# Patient Record
Sex: Male | Born: 1975 | Race: Black or African American | Marital: Single | Smoking: Former smoker
Health system: Northeastern US, Academic
[De-identification: ages and names within clinical notes are randomized; demographics above are authoritative.]

## PROBLEM LIST (undated history)

## (undated) DIAGNOSIS — N179 Acute kidney failure, unspecified: Secondary | ICD-10-CM

## (undated) DIAGNOSIS — G4730 Sleep apnea, unspecified: Secondary | ICD-10-CM

## (undated) DIAGNOSIS — I209 Angina pectoris, unspecified: Secondary | ICD-10-CM

## (undated) DIAGNOSIS — I5030 Unspecified diastolic (congestive) heart failure: Secondary | ICD-10-CM

## (undated) DIAGNOSIS — I2510 Atherosclerotic heart disease of native coronary artery without angina pectoris: Secondary | ICD-10-CM

## (undated) DIAGNOSIS — G35 Multiple sclerosis: Secondary | ICD-10-CM

## (undated) DIAGNOSIS — J45909 Unspecified asthma, uncomplicated: Secondary | ICD-10-CM

## (undated) DIAGNOSIS — N189 Chronic kidney disease, unspecified: Secondary | ICD-10-CM

## (undated) DIAGNOSIS — N185 Chronic kidney disease, stage 5: Secondary | ICD-10-CM

## (undated) DIAGNOSIS — F419 Anxiety disorder, unspecified: Secondary | ICD-10-CM

## (undated) DIAGNOSIS — I219 Acute myocardial infarction, unspecified: Secondary | ICD-10-CM

## (undated) DIAGNOSIS — J449 Chronic obstructive pulmonary disease, unspecified: Secondary | ICD-10-CM

## (undated) DIAGNOSIS — I10 Essential (primary) hypertension: Secondary | ICD-10-CM

## (undated) DIAGNOSIS — G35D Multiple sclerosis, unspecified: Secondary | ICD-10-CM

## (undated) HISTORY — DX: Chronic kidney disease, unspecified: N17.9

## (undated) HISTORY — DX: Chronic kidney disease, unspecified: N18.9

## (undated) HISTORY — DX: Chronic obstructive pulmonary disease, unspecified: J44.9

## (undated) HISTORY — DX: Sleep apnea, unspecified: G47.30

## (undated) HISTORY — DX: Multiple sclerosis: G35

## (undated) HISTORY — DX: Essential (primary) hypertension: I10

## (undated) HISTORY — DX: Acute myocardial infarction, unspecified: I21.9

## (undated) HISTORY — DX: Multiple sclerosis, unspecified: G35.D

---
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
## 1996-08-20 DIAGNOSIS — D649 Anemia, unspecified: Secondary | ICD-10-CM

## 1996-08-20 HISTORY — DX: Anemia, unspecified: D64.9

## 2008-08-20 DIAGNOSIS — I639 Cerebral infarction, unspecified: Secondary | ICD-10-CM

## 2008-08-20 DIAGNOSIS — I219 Acute myocardial infarction, unspecified: Secondary | ICD-10-CM

## 2008-08-20 HISTORY — DX: Cerebral infarction, unspecified: I63.9

## 2008-08-20 HISTORY — PX: CARDIAC CATHETERIZATION: SHX172

## 2008-08-20 HISTORY — DX: Acute myocardial infarction, unspecified: I21.9

## 2017-08-20 DIAGNOSIS — I10 Essential (primary) hypertension: Secondary | ICD-10-CM

## 2017-08-20 HISTORY — DX: Essential (primary) hypertension: I10

## 2020-01-05 ENCOUNTER — Ambulatory Visit: Payer: Self-pay | Attending: Internal Medicine | Admitting: Internal Medicine

## 2020-01-05 ENCOUNTER — Other Ambulatory Visit: Payer: Self-pay

## 2020-01-05 ENCOUNTER — Encounter: Payer: Self-pay | Admitting: Internal Medicine

## 2020-01-05 VITALS — BP 180/120 | HR 76 | Temp 97.2°F | Resp 16 | Ht 69.0 in | Wt 219.2 lb

## 2020-01-05 DIAGNOSIS — E66811 Obesity, class 1: Secondary | ICD-10-CM | POA: Insufficient documentation

## 2020-01-05 DIAGNOSIS — E6609 Other obesity due to excess calories: Secondary | ICD-10-CM

## 2020-01-05 DIAGNOSIS — E1159 Type 2 diabetes mellitus with other circulatory complications: Secondary | ICD-10-CM

## 2020-01-05 DIAGNOSIS — Z7689 Persons encountering health services in other specified circumstances: Secondary | ICD-10-CM

## 2020-01-05 DIAGNOSIS — I10 Essential (primary) hypertension: Secondary | ICD-10-CM

## 2020-01-05 DIAGNOSIS — I2510 Atherosclerotic heart disease of native coronary artery without angina pectoris: Secondary | ICD-10-CM

## 2020-01-05 DIAGNOSIS — Z6832 Body mass index (BMI) 32.0-32.9, adult: Secondary | ICD-10-CM

## 2020-01-05 DIAGNOSIS — J4540 Moderate persistent asthma, uncomplicated: Secondary | ICD-10-CM

## 2020-01-05 DIAGNOSIS — F17200 Nicotine dependence, unspecified, uncomplicated: Secondary | ICD-10-CM

## 2020-01-05 DIAGNOSIS — I6930 Unspecified sequelae of cerebral infarction: Secondary | ICD-10-CM

## 2020-01-05 DIAGNOSIS — E119 Type 2 diabetes mellitus without complications: Secondary | ICD-10-CM | POA: Insufficient documentation

## 2020-01-05 MED ORDER — AMLODIPINE BESYLATE 5 MG PO TABS: 5.0000 mg | ORAL_TABLET | Freq: Every day | ORAL | 3 refills | Status: DC

## 2020-01-05 MED ORDER — FLUTICASONE-SALMETEROL 250-50 MCG/DOSE IN AEPB: 1.0000 | INHALATION_SPRAY | Freq: Two times a day (BID) | RESPIRATORY_TRACT | 3 refills | Status: DC

## 2020-01-05 MED ORDER — MELOXICAM 15 MG PO TABS: 15.0000 mg | ORAL_TABLET | Freq: Every day | ORAL | 6 refills | Status: DC

## 2020-01-05 MED ORDER — LISINOPRIL 10 MG PO TABS: 5.0000 mg | ORAL_TABLET | Freq: Every day | ORAL | 6 refills | Status: DC

## 2020-01-05 MED ORDER — ASPIRIN EC 81 MG PO TBEC: 81.0000 mg | DELAYED_RELEASE_TABLET | Freq: Every day | ORAL | 3 refills | Status: DC

## 2020-01-05 MED ORDER — SIMVASTATIN 40 MG PO TABS: 40.0000 mg | ORAL_TABLET | Freq: Every day | ORAL | 6 refills | Status: DC

## 2020-01-05 MED ORDER — ALBUTEROL SULFATE HFA 108 (90 BASE) MCG/ACT IN AERS: 2.0000 | INHALATION_SPRAY | Freq: Four times a day (QID) | RESPIRATORY_TRACT | 6 refills | Status: DC | PRN

## 2020-01-05 MED ORDER — LISINOPRIL 10 MG PO TABS: 10.0000 mg | ORAL_TABLET | Freq: Every day | ORAL | 6 refills | Status: DC

## 2020-01-05 MED ORDER — METFORMIN HCL 500 MG PO TABS: 500.0000 mg | ORAL_TABLET | Freq: Every day | ORAL | 6 refills | Status: DC

## 2020-01-05 MED ORDER — METOPROLOL TARTRATE 25 MG PO TABS: 25.0000 mg | ORAL_TABLET | Freq: Two times a day (BID) | ORAL | 6 refills | Status: DC

## 2020-01-05 MED FILL — !ADVAIR 250/50 DISKUS: 250-50 | 30 days supply | Qty: 60 | Fill #0

## 2020-01-05 MED FILL — METOPROLOL TARTRATE 25 MG T: 25 | 30 days supply | Qty: 60 | Fill #0

## 2020-01-05 MED FILL — AMLODIPINE BESYLATE 5 MG TA: 5 | 30 days supply | Qty: 30 | Fill #0

## 2020-01-05 MED FILL — LISINOPRIL 10 MG TABS: 10 | 30 days supply | Qty: 15 | Fill #0

## 2020-01-05 MED FILL — VENTOLIN HFA 90 MCG INHALER: 108 (90 BAS | 25 days supply | Qty: 18 | Fill #0

## 2020-01-05 MED FILL — MELOXICAM 15 MG TABLET: 15 | 30 days supply | Qty: 30 | Fill #0

## 2020-01-05 MED FILL — METFORMIN HCL 500 MG TABS: 500 | 30 days supply | Qty: 30 | Fill #0

## 2020-01-05 NOTE — Progress Notes (Signed)
[id number]

## 2020-01-05 NOTE — Progress Notes (Signed)
Patient ID: George Day, male    DOB: 07-28-1976  MRN: [id number]  CC: New Patient (Initial Visit)   Subjective: George Day is a 44 y.o. male who presents for new pt visit His concerns today include:   Previous PCP was in [place], [place]. Last seen 1 mth ago.  Relocated to [place] to be with his fiance.   Pt with hx of HTN, HL, DM type 2, asthma, CAD (MI in 2010, no stent placement), CVA 2010 without residual deficit  HYPERTENSION/CAD Currently taking: see medication list.  He is on enalapril, Zocor, metoprolol, and sublingual nitro Med Adherence: [x]  Yes    []  No Medication side effects: []  Yes    [x]  No Adherence with salt restriction: [x]  Yes    []  No Home Monitoring?: [x]  Yes    []  No Monitoring Frequency: daily Home BP results range: 120/97.   SOB? []  Yes    [x]  No -all the time Chest Pain?: []  Yes    [x]  No. Last used SL Nitro 2019 Leg swelling?: [x]  Yes - RT leg    []  No Headaches?: []  Yes    [x]  No Dizziness? []  Yes    [x]  No Comments:  Suppose to be on ASA but currently does not have any.   DIABETES TYPE 2 Last A1C:   BS 97/A1C 5.6 Med Adherence:  [x]  Yes on Metformin   []  No Medication side effects:  []  Yes    [x]  No Home Monitoring?  [x]  Yes   Home glucose results range:before BF 90-100, bedtime 120-130 Diet Adherence: [x]  Yes - not eating as much as before     Exercise: [x]  Yes - always busy with his 3 little girls and walks some    Hypoglycemic episodes?: []  Yes    []  No Numbness of the feet? []  Yes    [x]  No -sharp pain RT foot at times Retinopathy hx? []  Yes    [x]  No Last eye exam: 10/2019.  New rxn lens Comments:  Asthma: on Advair inhaler 3 times a day. Takes 3 times/day because "I get so out of breath" especially when climbing steps.  -smokes 2 cigars a day.  Smoke since age 66.  Quit in 2010 for 2 yrs then restarted due to stress. Wants to quit.  Feels he can quit cold [place].   Set quit day for next Monday.    Was being eval by neurologist for  weakness RT leg. Reports having had multiple tests including scans of the head and whole body scans.  Scan of head revealed old stroke. RT leg feels heavy.  Has difficulty walking, often trips.  Problems x 3 yrs.  He states that the neurologist that he was seeing had referred him to see another neurologist to be evaluated to see if he has MS.  However he relocated prior to having that appointment.  Past medical, social, family history reviewed and updated. Current Outpatient Medications on File Prior to Visit  Medication Sig Dispense Refill  . nitroGLYCERIN (NITROSTAT) 0.6 MG SL tablet Place 0.6 mg under the tongue every 5 (five) minutes as needed for chest pain.     No current facility-administered medications on file prior to visit.    Allergies  Allergen Reactions  . Penicillins Anaphylaxis    ALL    Social History   Socioeconomic History  . Marital status: Single    Spouse name: Not on file  . Number of children: 3  . Years of education: Not on  file  . Highest education level: Not on file  Occupational History  . Not on file  Tobacco Use  . Smoking status: Current Every Day Smoker    Types: Cigars  . Smokeless tobacco: Never Used  Substance and Sexual Activity  . Alcohol use: Yes    Comment: occasioanlly   . Drug use: Never  . Sexual activity: Not on file  Other Topics Concern  . Not on file  Social History Narrative  . Not on file   Social Determinants of Health   Financial Resource Strain:   . Difficulty of Paying Living Expenses:   Food Insecurity:   . Worried About Charity fundraiser in the Last Year:   . Arboriculturist in the Last Year:   Transportation Needs:   . Film/video editor (Medical):   Marland Kitchen Lack of Transportation (Non-Medical):   Physical Activity:   . Days of Exercise per Week:   . Minutes of Exercise per Session:   Stress:   . Feeling of Stress :   Social Connections:   . Frequency of Communication with Friends and Family:   .  Frequency of Social Gatherings with Friends and Family:   . Attends Religious Services:   . Active Member of Clubs or Organizations:   . Attends Archivist Meetings:   Marland Kitchen Marital Status:   Intimate Partner Violence:   . Fear of Current or Ex-Partner:   . Emotionally Abused:   Marland Kitchen Physically Abused:   . Sexually Abused:     Family History  Problem Relation Age of Onset  . Diabetes Mother   . Hyperlipidemia Mother   . Diabetes Sister   . Hyperlipidemia Sister   . Hearing loss Sister   . Diabetes Brother   . Hyperlipidemia Brother     ROS: Review of Systems Negative except as stated above  PHYSICAL EXAM: BP (!) 180/120   Pulse 76   Temp (!) 97.2 F (36.2 C)   Resp 16   Ht 5\' 9"  (1.753 m)   Wt 219 lb 3.2 oz (99.4 kg)   SpO2 100%   BMI 32.37 kg/m   Physical Exam  General appearance - alert, well appearing, middle-age overweight African-American male and in no distress Mental status - normal mood, behavior, speech, dress, motor activity, and thought processes Eyes - pupils equal and reactive, extraocular eye movements intact Ears - bilateral TM's and external ear canals normal Nose - normal and patent, no erythema, discharge or polyps Mouth - mucous membranes moist, pharynx normal without lesions Neck - supple, no significant adenopathy Chest - clear to auscultation, no wheezes, rales or rhonchi, symmetric air entry Heart - normal rate, regular rhythm, normal S1, S2, no murmurs, rubs, clicks or gallops Neurological -cranial nerves grossly intact.  Power in the upper extremities including grip 5/5 bilaterally.  Power in the lower extremities 4+/5 on the right, 5/5 on the left Extremities -no lower extremity edema Diabetic Foot Exam - Simple   Simple Foot Form Visual Inspection See comments: Yes Sensation Testing See comments: Yes Pulse Check Posterior Tibialis and Dorsalis pulse intact bilaterally: Yes Comments Toenails are thick and overgrown.  Toenail on  the right big toe is the worst.  Decreased sensation on leap exam on the plantar surface of both feet.     ASSESSMENT AND PLAN: 1. Encounter to establish care   2. Type 2 diabetes mellitus with other circulatory complication, without long-term current use of insulin (HCC) Discussed the importance  of healthy eating habits, regular aerobic exercise (at least 150 minutes a week as tolerated) and medication compliance to achieve or maintain control of diabetes. -A1c is at goal.  Continue current dose of Metformin. - POCT glucose (manual entry) - POCT glycosylated hemoglobin (Hb A1C) - metFORMIN (GLUCOPHAGE) 500 MG tablet; Take 1 tablet (500 mg total) by mouth daily with breakfast.  Dispense: 30 tablet; Refill: 6 - CBC - Comprehensive metabolic panel - Lipid panel - Microalbumin / creatinine urine ratio  3. Essential hypertension Not at goal.  Continue metoprolol.  Increase lisinopril to 10 mg daily.  Add amlodipine - metoprolol tartrate (LOPRESSOR) 25 MG tablet; Take 1 tablet (25 mg total) by mouth 2 (two) times daily.  Dispense: 60 tablet; Refill: 6 - lisinopril (ZESTRIL) 10 MG tablet;  by mouth daily.  Dispense: 30 tablet; Refill: 6 - amLODipine (NORVASC) 5 MG tablet; Take 1 tablet (5 mg total) by mouth daily. For blood pressure  Dispense: 30 tablet; Refill: 3  4. Tobacco dependence Patient advised to quit smoking. Discussed health risks associated with smoking including lung and other types of cancers, chronic lung diseases and CV risks.. Pt ready to give trail of quitting.  Discussed methods to help quit including quitting cold [place], use of NRT, Chantix and Bupropion.  Patient states he will quit cold [place] next Monday.  If he finds that his cravings are too great, I told him to give me a call so that we can prescribe medication to decrease the cravings.  Less than 5 minutes spent on counseling   5. Coronary artery disease involving native coronary artery of native heart without  angina pectoris Stable.  Continue statin therapy and metoprolol.  Added low-dose aspirin - simvastatin (ZOCOR) 40 MG tablet; Take 1 tablet (40 mg total) by mouth daily.  Dispense: 30 tablet; Refill: 6 - metoprolol tartrate (LOPRESSOR) 25 MG tablet; Take 1 tablet (25 mg total) by mouth 2 (two) times daily.  Dispense: 60 tablet; Refill: 6 - aspirin EC 81 MG tablet; Take 1 tablet (81 mg total) by mouth daily.  Dispense: 100 tablet; Refill: 3  6. History of CVA with residual deficit Stressed importance of secondary prevention including good blood pressure control, tobacco cessation and diabetes control  7. Moderate persistent asthma without complication Advised that Advair should be used just twice a day not 3 times a day.  Went over the difference between maintenance and rescue inhaler. - Fluticasone-Salmeterol (ADVAIR DISKUS) 250-50 MCG/DOSE AEPB; Inhale 1 puff into the lungs 2 (two) times daily.  Dispense: 1 each; Refill: 3 - albuterol (VENTOLIN HFA) 108 (90 Base) MCG/ACT inhaler; Inhale 2 puffs into the lungs every 6 (six) hours as needed for wheezing or shortness of breath.  Dispense: 6.7 g; Refill: 6  8. Class 1 obesity due to excess calories with serious comorbidity and body mass index (BMI) of 32.0 to 32.9 in adult See #1 above   Patient was given the opportunity to ask questions.  Patient verbalized understanding of the plan and was able to repeat key elements of the plan.   Orders Placed This Encounter  Procedures  . CBC  . Comprehensive metabolic panel  . Lipid panel  . Microalbumin / creatinine urine ratio  . POCT glucose (manual entry)  . POCT glycosylated hemoglobin (Hb A1C)     Requested Prescriptions   Signed Prescriptions Disp Refills  . simvastatin (ZOCOR) 40 MG tablet 30 tablet 6    Sig: Take 1 tablet (40 mg total) by mouth daily.  Marland Kitchen  metFORMIN (GLUCOPHAGE) 500 MG tablet 30 tablet 6    Sig: Take 1 tablet (500 mg total) by mouth daily with breakfast.  . metoprolol  tartrate (LOPRESSOR) 25 MG tablet 60 tablet 6    Sig: Take 1 tablet (25 mg total) by mouth 2 (two) times daily.  . meloxicam (MOBIC) 15 MG tablet 30 tablet 6    Sig: Take 1 tablet (15 mg total) by mouth daily.  Marland Kitchen aspirin EC 81 MG tablet 100 tablet 3    Sig: Take 1 tablet (81 mg total) by mouth daily.  . Fluticasone-Salmeterol (ADVAIR DISKUS) 250-50 MCG/DOSE AEPB 1 each 3    Sig: Inhale 1 puff into the lungs 2 (two) times daily.  Marland Kitchen albuterol (VENTOLIN HFA) 108 (90 Base) MCG/ACT inhaler 6.7 g 6    Sig: Inhale 2 puffs into the lungs every 6 (six) hours as needed for wheezing or shortness of breath.  Marland Kitchen amLODipine (NORVASC) 5 MG tablet 30 tablet 3    Sig: Take 1 tablet (5 mg total) by mouth daily. For blood pressure  . lisinopril (ZESTRIL) 10 MG tablet 30 tablet 6    Sig: Take 1 tablet (10 mg total) by mouth daily.    Return in about 7 weeks (around 02/23/2020).  Karle Plumber, MD, FACP

## 2020-01-05 NOTE — Patient Instructions (Signed)
Please have patient sign a release for [place] to get his medical records from his previous PCP in [place].  Please give patient an appointment with the clinical pharmacist in 1 week for repeat blood pressure check.  Please give patient forms for the orange card/cone discount.

## 2020-01-06 ENCOUNTER — Telehealth: Payer: Self-pay | Admitting: Internal Medicine

## 2020-01-06 LAB — CBC
Hematocrit: 41.7 % (ref 37.5–51.0)
Hemoglobin: 12.6 g/dL — ABNORMAL LOW (ref 13.0–17.7)
MCH: 23.5 pg — ABNORMAL LOW (ref 26.6–33.0)
MCHC: 30.2 g/dL — ABNORMAL LOW (ref 31.5–35.7)
MCV: 78 fL — ABNORMAL LOW (ref 79–97)
Platelets: 190 10*3/uL (ref 150–450)
RBC: 5.37 x10E6/uL (ref 4.14–5.80)
RDW: 16.6 % — ABNORMAL HIGH (ref 11.6–15.4)
WBC: 6.4 10*3/uL (ref 3.4–10.8)

## 2020-01-06 LAB — COMPREHENSIVE METABOLIC PANEL
ALT: 24 IU/L (ref 0–44)
AST: 28 IU/L (ref 0–40)
Albumin/Globulin Ratio: 1.4 (ref 1.2–2.2)
Albumin: 4.5 g/dL (ref 4.0–5.0)
Alkaline Phosphatase: 108 IU/L (ref 48–121)
BUN/Creatinine Ratio: 16 (ref 9–20)
BUN: 29 mg/dL — ABNORMAL HIGH (ref 6–24)
Bilirubin Total: 0.5 mg/dL (ref 0.0–1.2)
CO2: 23 mmol/L (ref 20–29)
Calcium: 9.7 mg/dL (ref 8.7–10.2)
Chloride: 105 mmol/L (ref 96–106)
Creatinine, Ser: 1.83 mg/dL — ABNORMAL HIGH (ref 0.76–1.27)
GFR calc Af Amer: 51 mL/min/{1.73_m2} — ABNORMAL LOW (ref >59)
GFR calc non Af Amer: 44 mL/min/{1.73_m2} — ABNORMAL LOW (ref >59)
Globulin, Total: 3.3 g/dL (ref 1.5–4.5)
Glucose: 83 mg/dL (ref 65–99)
Potassium: 4.4 mmol/L (ref 3.5–5.2)
Sodium: 142 mmol/L (ref 134–144)
Total Protein: 7.8 g/dL (ref 6.0–8.5)

## 2020-01-06 LAB — LIPID PANEL
Chol/HDL Ratio: 4.4 ratio (ref 0.0–5.0)
Cholesterol, Total: 181 mg/dL (ref 100–199)
HDL: 41 mg/dL (ref >39)
LDL Chol Calc (NIH): 111 mg/dL — ABNORMAL HIGH (ref 0–99)
Triglycerides: 167 mg/dL — ABNORMAL HIGH (ref 0–149)
VLDL Cholesterol Cal: 29 mg/dL (ref 5–40)

## 2020-01-06 LAB — GLUCOSE, POCT (MANUAL RESULT ENTRY): POC Glucose: 97 mg/dl (ref 70–99)

## 2020-01-06 LAB — POCT GLYCOSYLATED HEMOGLOBIN (HGB A1C): HbA1c POC (<> result, manual entry): 5.6 % (ref 4.0–5.6)

## 2020-01-06 MED ORDER — ATORVASTATIN CALCIUM 40 MG PO TABS
40.0000 mg | ORAL_TABLET | Freq: Every day | ORAL | 6 refills | Status: DC
Start: 2020-01-06 — End: 2020-08-16

## 2020-01-06 MED FILL — ATORVASTATIN CALCIUM 40 MG: 40 | 30 days supply | Qty: 30 | Fill #0

## 2020-01-06 NOTE — Telephone Encounter (Signed)
Phone call placed to patient today to go over lab results from yesterday.  Patient advised that his LDL cholesterol was 111 with goal being less than 70 in patients with diabetes.  He reports compliance with simvastatin.  I have recommended that we discontinue simvastatin and change it to Lipitor instead which I feel will work better for him.  2.  Kidney function is not 100%.  His GFR is 51 and creatinine is 1.8.  Patient denies any previous diagnosis of impaired kidney function.  Denies any use of over-the-counter NSAIDs.   Advised to discontinue meloxicam.  Use over-the-counter Tylenol if needed for pain Advised to hold Metformin.  His A1C yesterday was 5.6. We had changed enalapril to lisinopril and had increase the dose to 10 mg.  Await results of his urine microalbumin.  Patient tells me he was unable to give a urine specimen yesterday but was given the container and he plans to bring in a urine sample this week.  He did sign a release for [place] to get his medical records from his previous PCP. I would like for him to see the clinical pharmacist in 2 weeks for repeat blood pressure check.  On that visit he should stop at the lab to have a BMP done.  Patient expressed understanding of the plan and was able to repeat back instructions given.  Results for orders placed or performed in visit on 01/05/20  CBC  Result Value Ref Range   WBC 6.4 3.4 - 10.8 x10E3/uL   RBC 5.37 4.14 - 5.80 x10E6/uL   Hemoglobin 12.6 (L) 13.0 - 17.7 g/dL   Hematocrit 41.7 37.5 - 51.0 %   MCV 78 (L) 79 - 97 fL   MCH 23.5 (L) 26.6 - 33.0 pg   MCHC 30.2 (L) 31.5 - 35.7 g/dL   RDW 16.6 (H) 11.6 - 15.4 %   Platelets 190 150 - 450 x10E3/uL  Comprehensive metabolic panel  Result Value Ref Range   Glucose 83 65 - 99 mg/dL   BUN 29 (H) 6 - 24 mg/dL   Creatinine, Ser 1.83 (H) 0.76 - 1.27 mg/dL   GFR calc non Af Amer 44 (L) >59 mL/min/1.73   GFR calc Af Amer 51 (L) >59 mL/min/1.73   BUN/Creatinine Ratio 16 9 - 20   Sodium  142 134 - 144 mmol/L   Potassium 4.4 3.5 - 5.2 mmol/L   Chloride 105 96 - 106 mmol/L   CO2 23 20 - 29 mmol/L   Calcium 9.7 8.7 - 10.2 mg/dL   Total Protein 7.8 6.0 - 8.5 g/dL   Albumin 4.5 4.0 - 5.0 g/dL   Globulin, Total 3.3 1.5 - 4.5 g/dL   Albumin/Globulin Ratio 1.4 1.2 - 2.2   Bilirubin Total 0.5 0.0 - 1.2 mg/dL   Alkaline Phosphatase 108 48 - 121 IU/L   AST 28 0 - 40 IU/L   ALT 24 0 - 44 IU/L  Lipid panel  Result Value Ref Range   Cholesterol, Total 181 100 - 199 mg/dL   Triglycerides 167 (H) 0 - 149 mg/dL   HDL 41 >39 mg/dL   VLDL Cholesterol Cal 29 5 - 40 mg/dL   LDL Chol Calc (NIH) 111 (H) 0 - 99 mg/dL   Chol/HDL Ratio 4.4 0.0 - 5.0 ratio  POCT glucose (manual entry)  Result Value Ref Range   POC Glucose 97 70 - 99 mg/dl  POCT glycosylated hemoglobin (Hb A1C)  Result Value Ref Range   Hemoglobin A1C  HbA1c POC (<> result, manual entry) 5.6 4.0 - 5.6 %   HbA1c, POC (prediabetic range)     HbA1c, POC (controlled diabetic range)

## 2020-01-07 NOTE — Telephone Encounter (Signed)
Looks like you schedule pt his 7 week f/u with pcp when he checked out but not his 2 week bp check with clinical pharmacist. Please contact pt and schedule 2 week bp check from the day he seen pcp

## 2020-01-19 ENCOUNTER — Other Ambulatory Visit: Payer: Self-pay | Admitting: Internal Medicine

## 2020-01-19 ENCOUNTER — Other Ambulatory Visit: Payer: Self-pay

## 2020-01-19 ENCOUNTER — Ambulatory Visit: Payer: Self-pay | Attending: Internal Medicine

## 2020-01-19 DIAGNOSIS — I10 Essential (primary) hypertension: Secondary | ICD-10-CM

## 2020-01-19 DIAGNOSIS — E1159 Type 2 diabetes mellitus with other circulatory complications: Secondary | ICD-10-CM

## 2020-01-19 MED ORDER — AMLODIPINE BESYLATE 10 MG PO TABS: 10.0000 mg | ORAL_TABLET | Freq: Every day | ORAL | 6 refills | Status: DC

## 2020-01-19 NOTE — Progress Notes (Signed)
44 yrs old male is here for BP check as instructed by PCP. Pt initially scheduled with [facility] clinical pharmacist. Pt asymptomatic with no signs of acute distress. Stated has been taking all his meds  since prescribed including  today.  BP readings today  Manual L arm 194/104 P 69 O2 99. T 98.0. Advised pt to stay and see one of the provided. Unable to stay , has to go and pick up his son according to patient.  Readings were reported to MD Karle Plumber, patient PCP.  As per PCP Instructions were given to the patient to increase Amlodipine from 5 mg daily to 10 mg daily. Made pt aware of the medication change and that the new dose prescription was sent to the pharmacy. Educated pt about strict adherence to medication regime and the need for  diet and exercises. Stress the importance of obtaining a BP machine and keeping a log and bring it to the appt when seeing the provider. Verbalized understanding.  Appt with Lurena Joiner for BP f /U was scheduled for 01/29/2020

## 2020-01-20 ENCOUNTER — Telehealth: Payer: Self-pay

## 2020-01-20 LAB — MICROALBUMIN / CREATININE URINE RATIO
Creatinine, Urine: 148.2 mg/dL
Microalb/Creat Ratio: 133 mg/g creat — ABNORMAL HIGH (ref 0–29)
Microalbumin, Urine: 196.6 ug/mL

## 2020-01-20 NOTE — Telephone Encounter (Signed)
Contacted pt to go over urine results pt is aware and doesn't have any questions or concerns

## 2020-01-20 NOTE — Progress Notes (Signed)
Let patient know that he has mild amount of protein in the urine which could be due to the effects of diabetes on the kidney.  The medication lisinopril will help to protect the kidneys.

## 2020-01-29 ENCOUNTER — Ambulatory Visit: Payer: Self-pay | Attending: Internal Medicine | Admitting: Pharmacist

## 2020-01-29 ENCOUNTER — Other Ambulatory Visit: Payer: Self-pay

## 2020-01-29 ENCOUNTER — Encounter: Payer: Self-pay | Admitting: Pharmacist

## 2020-01-29 VITALS — BP 186/112

## 2020-01-29 DIAGNOSIS — I10 Essential (primary) hypertension: Secondary | ICD-10-CM

## 2020-01-29 NOTE — Progress Notes (Signed)
   S:    PCP: Dr. Wynetta Emery   Patient arrives in good spirits. Presents to the clinic for hypertension evaluation, counseling, and management.  Patient was referred and last seen by Primary Care Provider on 01/05/2020. Amlodipine and lisinopril were started.    Medication adherence: denies. He brings his current medications that were filled at a [facility] in [place]. Pt does not have amlodipine with him. Nor does he have lisinopril. Upon review of his pharmacy records, he never picked these up from our pharmacy after being told to do so at previous PCP visit.    Current BP Medications include:  Amlodipine 10 mg daily (not taking), lisinopril 10 mg daily (not taking), metoprolol 25 mg BID  Dietary habits include: pt limits salt; denies drinking caffeine  Exercise habits include: limited d/t leg/knee pain Family / Social history:  - FHx: DM, HLD - Tobacco: current every day smoker  - Alcohol: reports occasional use    O:  There were no vitals filed for this visit.  Home BP readings: none   Last 3 Office BP readings: BP Readings from Last 3 Encounters:  01/05/20 (!) 180/120    BMET    Component Value Date/Time   NA 142 01/05/2020 1515   K 4.4 01/05/2020 1515   CL 105 01/05/2020 1515   CO2 23 01/05/2020 1515   GLUCOSE 83 01/05/2020 1515   BUN 29 (H) 01/05/2020 1515   CREATININE 1.83 (H) 01/05/2020 1515   CALCIUM 9.7 01/05/2020 1515   GFRNONAA 44 (L) 01/05/2020 1515   GFRAA 51 (L) 01/05/2020 1515    Renal function: CrCl cannot be calculated (Patient's most recent lab result is older than the maximum 21 days allowed.).  Clinical ASCVD: Yes   A/P: Hypertension longstanding currently above goal on current medications. BP Goal = < 130/80 mmHg. Medication adherence: denies. I have coordinated with our pharmacy to fill and dispense his anti-HTN medications today. He will pick these up before leaving our clinic.   -Continued current regimen. Refills given.   -Counseled on lifestyle  modifications for blood pressure control including reduced dietary sodium, increased exercise, adequate sleep.  Results reviewed and written information provided.   Total time in face-to-face counseling 15 minutes.   F/U Clinic Visit 02/12/2020.   Benard Halsted, PharmD, [place] [phone]

## 2020-02-09 MED FILL — AMLODIPINE BESYLATE 10 MG T: 10 | 30 days supply | Qty: 30 | Fill #0

## 2020-02-09 MED FILL — VENTOLIN HFA 90 MCG INHALER: 108 (90 BAS | 25 days supply | Qty: 18 | Fill #0

## 2020-02-12 ENCOUNTER — Encounter: Payer: Self-pay | Admitting: Pharmacist

## 2020-02-12 ENCOUNTER — Other Ambulatory Visit: Payer: Self-pay

## 2020-02-12 ENCOUNTER — Ambulatory Visit: Payer: Self-pay | Attending: Internal Medicine | Admitting: Pharmacist

## 2020-02-12 VITALS — BP 203/130 | HR 74

## 2020-02-12 DIAGNOSIS — I10 Essential (primary) hypertension: Secondary | ICD-10-CM

## 2020-02-12 MED ORDER — NICOTINE 21 MG/24HR TD PT24: 21.0000 mg | MEDICATED_PATCH | Freq: Every day | TRANSDERMAL | 1 refills | Status: DC

## 2020-02-12 MED FILL — LISINOPRIL 10 MG TABS: 10 | 30 days supply | Qty: 15 | Fill #0

## 2020-02-12 MED FILL — NICOTINE 21 MG/24HR PATCH: 21 | 28 days supply | Qty: 28 | Fill #0

## 2020-02-12 NOTE — Progress Notes (Signed)
   S:    PCP: Dr. Wynetta Emery   Patient arrives in good spirits. Presents to the clinic for hypertension evaluation, counseling, and management.  Patient was referred and last seen by Primary Care Provider on 01/05/2020. Amlodipine and lisinopril were started.    Medication adherence: denies. Pt does not medications with him. I checked our pharmacy records again today, and pt has yet to pick-up or begin medications  Current BP Medications include:  Amlodipine 10 mg daily (not taking), lisinopril 10 mg daily (not taking), metoprolol 25 mg BID  Dietary habits include: pt limits salt; denies drinking caffeine  Exercise habits include: limited d/t leg/knee pain Family / Social history:  - FHx: DM, HLD - Tobacco: current every day smoker  - Alcohol: reports occasional use   O:  There were no vitals filed for this visit.  Home BP readings: none   Last 3 Office BP readings: BP Readings from Last 3 Encounters:  01/29/20 (!) 186/112  01/05/20 (!) 180/120    BMET    Component Value Date/Time   NA 142 01/05/2020 1515   K 4.4 01/05/2020 1515   CL 105 01/05/2020 1515   CO2 23 01/05/2020 1515   GLUCOSE 83 01/05/2020 1515   BUN 29 (H) 01/05/2020 1515   CREATININE 1.83 (H) 01/05/2020 1515   CALCIUM 9.7 01/05/2020 1515   GFRNONAA 44 (L) 01/05/2020 1515   GFRAA 51 (L) 01/05/2020 1515    Renal function: CrCl cannot be calculated (Patient's most recent lab result is older than the maximum 21 days allowed.).  Clinical ASCVD: Yes   A/P: Hypertension longstanding - BP today consistent with hypertensive urgency and this is largely due to medication noncompliance. Pt is asymptomatic with no chest pain, dyspnea, HA or blurred vision. Denies any dizziness, fatigue, palpitations, or lightheadedness. BP Goal = < 130/80 mmHg. I walked over with Mr. Cobern to our pharmacy today to pick-up BP medications and he will take these upon returning home. I have given him clear instructions regarding when to  seek emergency care.  -Continued current regimen.  -Pt instructed to pick-up medications and take these upon receipt.  -Counseled on lifestyle modifications for blood pressure control including reduced dietary sodium, increased exercise, adequate sleep.  Results reviewed and written information provided.   Total time in face-to-face counseling 15 minutes.   F/U Clinic Visit in 1 week.   Benard Halsted, PharmD, [place] [phone]

## 2020-02-12 NOTE — Addendum Note (Signed)
Addended by: Daisy Blossom, Annie Main L on: 02/12/2020 02:27 PM   Modules accepted: Orders

## 2020-02-19 ENCOUNTER — Ambulatory Visit: Payer: Self-pay | Admitting: Pharmacist

## 2020-02-24 ENCOUNTER — Ambulatory Visit: Payer: Self-pay | Attending: Internal Medicine | Admitting: Pharmacist

## 2020-02-24 ENCOUNTER — Other Ambulatory Visit: Payer: Self-pay

## 2020-02-24 ENCOUNTER — Encounter: Payer: Self-pay | Admitting: Pharmacist

## 2020-02-24 VITALS — BP 190/118

## 2020-02-24 DIAGNOSIS — I10 Essential (primary) hypertension: Secondary | ICD-10-CM

## 2020-02-24 NOTE — Progress Notes (Signed)
° °  S:    PCP: Dr. Wynetta Emery   Patient arrives in good spirits. Presents to the clinic for hypertension evaluation, counseling, and management.  Patient was referred and last seen by Primary Care Provider on 01/05/2020. Amlodipine and lisinopril were started.    Medication adherence: denies. He is taking amlodipine and lisinopril. I checked our pharmacy records again today, and pt has yet to pick-up metoprolol.   Current BP Medications include:  Amlodipine 10 mg daily, lisinopril 10 mg daily, metoprolol 25 mg BID (not taking)  Dietary habits include: pt limits salt; denies drinking caffeine  Exercise habits include: limited d/t leg/knee pain Family / Social history:  - FHx: DM, HLD - Tobacco: current every day smoker  - Alcohol: reports occasional use   O:  Vitals:   02/24/20 1421  BP: (!) 190/118   Home BP readings: none   Last 3 Office BP readings: BP Readings from Last 3 Encounters:  02/24/20 (!) 190/118  02/12/20 (!) 203/130  01/29/20 (!) 186/112    BMET    Component Value Date/Time   NA 142 01/05/2020 1515   K 4.4 01/05/2020 1515   CL 105 01/05/2020 1515   CO2 23 01/05/2020 1515   GLUCOSE 83 01/05/2020 1515   BUN 29 (H) 01/05/2020 1515   CREATININE 1.83 (H) 01/05/2020 1515   CALCIUM 9.7 01/05/2020 1515   GFRNONAA 44 (L) 01/05/2020 1515   GFRAA 51 (L) 01/05/2020 1515    Renal function: CrCl cannot be calculated (Patient's most recent lab result is older than the maximum 21 days allowed.).  Clinical ASCVD: Yes   A/P: Hypertension longstanding - BP today consistent with hypertensive urgency and compliance continues to be an issue. Pt is asymptomatic with no chest pain, dyspnea, HA or blurred vision. Denies any dizziness, fatigue, palpitations, or lightheadedness. BP Goal = < 130/80 mmHg. He has a f/u appointment with his PCP tomorrow.  -Continued current regimen.  -Encouraged compliance with all medications.  -Counseled on lifestyle modifications for blood  pressure control including reduced dietary sodium, increased exercise, adequate sleep.  Results reviewed and written information provided.   Total time in face-to-face counseling 15 minutes.   F/U Clinic Visit tomorrow with Dr. Wynetta Emery.   Benard Halsted, PharmD, [place] [phone]

## 2020-02-25 ENCOUNTER — Encounter: Payer: Self-pay | Admitting: Internal Medicine

## 2020-02-25 ENCOUNTER — Ambulatory Visit: Payer: Self-pay | Attending: Internal Medicine | Admitting: Internal Medicine

## 2020-02-25 VITALS — BP 189/107 | HR 66 | Temp 98.1°F | Resp 16 | Wt 223.6 lb

## 2020-02-25 DIAGNOSIS — E785 Hyperlipidemia, unspecified: Secondary | ICD-10-CM | POA: Insufficient documentation

## 2020-02-25 DIAGNOSIS — N185 Chronic kidney disease, stage 5: Secondary | ICD-10-CM | POA: Insufficient documentation

## 2020-02-25 DIAGNOSIS — R200 Anesthesia of skin: Secondary | ICD-10-CM

## 2020-02-25 DIAGNOSIS — N186 End stage renal disease: Secondary | ICD-10-CM | POA: Insufficient documentation

## 2020-02-25 DIAGNOSIS — N1831 Chronic kidney disease, stage 3a: Secondary | ICD-10-CM | POA: Insufficient documentation

## 2020-02-25 DIAGNOSIS — I10 Essential (primary) hypertension: Secondary | ICD-10-CM

## 2020-02-25 DIAGNOSIS — Z87891 Personal history of nicotine dependence: Secondary | ICD-10-CM

## 2020-02-25 DIAGNOSIS — N184 Chronic kidney disease, stage 4 (severe): Secondary | ICD-10-CM | POA: Insufficient documentation

## 2020-02-25 DIAGNOSIS — Z1331 Encounter for screening for depression: Secondary | ICD-10-CM

## 2020-02-25 DIAGNOSIS — F411 Generalized anxiety disorder: Secondary | ICD-10-CM | POA: Insufficient documentation

## 2020-02-25 MED ORDER — BUSPIRONE HCL 7.5 MG PO TABS: 7.5000 mg | ORAL_TABLET | Freq: Three times a day (TID) | ORAL | 3 refills | Status: DC

## 2020-02-25 MED ORDER — CLONIDINE HCL 0.1 MG PO TABS: 0.1000 mg | ORAL_TABLET | Freq: Two times a day (BID) | ORAL | 11 refills | Status: DC

## 2020-02-25 NOTE — Patient Instructions (Addendum)
Continue to check your blood pressure at least 3 times a week.  The goal is to get you to 130/80 or lower.  We have added a new blood pressure medication called clonidine 0.1 mg for you to take twice a day.  Continue to limit salt in the food.  We have started you on a medication called BuSpar to use for the anxiety.  Please sign a release for [place] to get your medical records from a previous provider.

## 2020-02-25 NOTE — Progress Notes (Signed)
Patient ID: George Day, male    DOB: February 21, 1976  MRN: [id number]  CC: Hypertension   Subjective: George Day is a 44 y.o. 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   He has seen the clinical pharmacist 3 times since last visit with me.  On two of those visits, it was discovered that he had not picked up prescriptions as yet for the Norvasc and lisinopril.  He finally picked them up recently but blood pressure was still high.  He has not picked up the metoprolol.  Patient tells me he did not pick that one up because he still has a lot of it at home from his previous provider.  However he is taking it only once a day instead of twice a day.  He thought taking it twice a day would be too much. Checks BP with wrist cuff.  Does not have log with him.  Reports BP has been going down. BP at home today as 160+/99  HL:  Did not pick up Lipitor as yet.  Waiting for insurance to kick in otherwise will get it this Friday.  Tob Dep:  Quit smoking for the past 1 mth.  Has a child on the way.   Numbness on RT side when he first lay down.  Goes away after about 15 mins.  Positive Dep and Anx Screen:  Reports anxiety always an issue and carries a diagnosis of generalized anxiety. Also feels he has to stay moving.  Worries about his family.  "It does not affect my day to day life" but feels he would benefit from being on medication for the anxiety.  He states that depression is not a major issue for him at this time. Patient Active Problem List   Diagnosis Date Noted  . Diabetes mellitus ([place]) 01/05/2020  . Essential hypertension 01/05/2020  . Tobacco dependence 01/05/2020  . Coronary artery disease involving native coronary artery of native heart without angina pectoris 01/05/2020  . History of CVA  with residual deficit 01/05/2020  . Moderate persistent asthma without complication 69/67/8938  . Class 1 obesity due to excess calories with serious comorbidity and body mass index (BMI) of 32.0 to 32.9 in adult 01/05/2020     Current Outpatient Medications on File Prior to Visit  Medication Sig Dispense Refill  . albuterol (VENTOLIN HFA) 108 (90 Base) MCG/ACT inhaler Inhale 2 puffs into the lungs every 6 (six) hours as needed for wheezing or shortness of breath. 6.7 g 6  . amLODipine (NORVASC) 10 MG tablet Take 1 tablet (10 mg total) by mouth daily. For blood pressure 30 tablet 6  . aspirin EC 81 MG tablet Take 1 tablet (81 mg total) by mouth daily. 100 tablet 3  . atorvastatin (LIPITOR) 40 MG tablet Take 1 tablet (40 mg total) by mouth daily. 30 tablet 6  . Fluticasone-Salmeterol (ADVAIR DISKUS) 250-50 MCG/DOSE AEPB Inhale 1 puff into the lungs 2 (two) times daily. 1 each 3  . lisinopril (ZESTRIL) 10 MG tablet Take 1 tablet (10 mg total) by mouth daily. 30 tablet 6  . metoprolol tartrate (LOPRESSOR) 25 MG tablet Take 1 tablet (25 mg total) by mouth 2 (two) times daily. 60 tablet 6  . nicotine (NICODERM CQ - DOSED IN  MG/24 HOURS) 21 mg/24hr patch Place 1 patch (21 mg total) onto the skin daily. 28 patch 1  . nitroGLYCERIN (NITROSTAT) 0.6 MG SL tablet Place 0.6 mg under the tongue every 5 (five) minutes as needed for chest pain.     No current facility-administered medications on file prior to visit.    Allergies  Allergen Reactions  . Penicillins Anaphylaxis    ALL    Social History   Socioeconomic History  . Marital status: Single    Spouse name: Not on file  . Number of children: 3  . Years of education: Not on file  . Highest education level: Not on file  Occupational History  . Not on file  Tobacco Use  . Smoking status: Current Every Day Smoker    Types: Cigars  . Smokeless tobacco: Never Used  Vaping Use  . Vaping Use: Never used  Substance and Sexual Activity  .  Alcohol use: Yes    Comment: occasioanlly   . Drug use: Never  . Sexual activity: Not on file  Other Topics Concern  . Not on file  Social History Narrative  . Not on file   Social Determinants of Health   Financial Resource Strain:   . Difficulty of Paying Living Expenses:   Food Insecurity:   . Worried About Charity fundraiser in the Last Year:   . Arboriculturist in the Last Year:   Transportation Needs:   . Film/video editor (Medical):   Marland Kitchen Lack of Transportation (Non-Medical):   Physical Activity:   . Days of Exercise per Week:   . Minutes of Exercise per Session:   Stress:   . Feeling of Stress :   Social Connections:   . Frequency of Communication with Friends and Family:   . Frequency of Social Gatherings with Friends and Family:   . Attends Religious Services:   . Active Member of Clubs or Organizations:   . Attends Archivist Meetings:   Marland Kitchen Marital Status:   Intimate Partner Violence:   . Fear of Current or Ex-Partner:   . Emotionally Abused:   Marland Kitchen Physically Abused:   . Sexually Abused:     Family History  Problem Relation Age of Onset  . Diabetes Mother   . Hyperlipidemia Mother   . Diabetes Sister   . Hyperlipidemia Sister   . Hearing loss Sister   . Diabetes Brother   . Hyperlipidemia Brother     No past surgical history on file.  ROS: Review of Systems Negative except as stated above  PHYSICAL EXAM: BP (!) 189/107   Pulse 66   Temp 98.1 F (36.7 C)   Resp 16   Wt 223 lb 9.6 oz (101.4 kg)   SpO2 100%   BMI 33.02 kg/m   Physical Exam   General appearance - alert, well appearing, middle-aged African-American male and in no distress Mental status - normal mood, behavior, speech, dress, motor activity, and thought processes Chest - clear to auscultation, no wheezes, rales or rhonchi, symmetric air entry Heart - normal rate, regular rhythm, normal S1, S2, no murmurs, rubs, clicks or gallops Extremities - peripheral pulses  normal, no pedal edema, no clubbing or cyanosis  CMP Latest Ref Rng & Units 01/05/2020  Glucose 65 - 99 mg/dL 83  BUN 6 - 24 mg/dL 29(H)  Creatinine 0.76 - 1.27 mg/dL 1.83(H)  Sodium 134 - 144 mmol/L 142  Potassium 3.5 - 5.2 mmol/L 4.4  Chloride 96 -  106 mmol/L 105  CO2 20 - 29 mmol/L 23  Calcium 8.7 - 10.2 mg/dL 9.7  Total Protein 6.0 - 8.5 g/dL 7.8  Total Bilirubin 0.0 - 1.2 mg/dL 0.5  Alkaline Phos 48 - 121 IU/L 108  AST 0 - 40 IU/L 28  ALT 0 - 44 IU/L 24   Lipid Panel     Component Value Date/Time   CHOL 181 01/05/2020 1515   TRIG 167 (H) 01/05/2020 1515   HDL 41 01/05/2020 1515   CHOLHDL 4.4 01/05/2020 1515   LDLCALC 111 (H) 01/05/2020 1515    CBC    Component Value Date/Time   WBC 6.4 01/05/2020 1515   RBC 5.37 01/05/2020 1515   HGB 12.6 (L) 01/05/2020 1515   HCT 41.7 01/05/2020 1515   PLT 190 01/05/2020 1515   MCV 78 (L) 01/05/2020 1515   MCH 23.5 (L) 01/05/2020 1515   MCHC 30.2 (L) 01/05/2020 1515   RDW 16.6 (H) 01/05/2020 1515    ASSESSMENT AND PLAN:  1. Essential hypertension Not at goal. I suspect he is taking the metoprolol because his pulse rate is in the 60s.  I recommend we add another medication in the form of clonidine.  Advised patient that the metoprolol is to be taken twice a day. - cloNIDine (CATAPRES) 0.1 MG tablet; Take 1 tablet (0.1 mg total) by mouth 2 (two) times daily.  Dispense: 60 tablet; Refill: 11  2. Tobacco dependence Commended him on quitting.  Encouraged him to remain tobacco free  3. GAD (generalized anxiety disorder) We will try him on low-dose of BuSpar - busPIRone (BUSPAR) 7.5 MG tablet; Take 1 tablet (7.5 mg total) by mouth 3 (three) times daily.  Dispense: 60 tablet; Refill: 3  4. Positive depression screening Patient reports this is not a major issue for him at this time  5. Hyperlipidemia, unspecified hyperlipidemia type He will pick up the prescription for the Lipitor when he is financially able.  He anticipates  getting insurance 1 August.  6. Numbness Observe for now but stressed the importance of good blood pressure control  7. Stage 3a chronic kidney disease I await his records from his previous PCP to see whether this is a new issue.  We will check BMP given that we increase ACE inhibitor when I last saw him. - Basic Metabolic Panel    Patient was given the opportunity to ask questions.  Patient verbalized understanding of the plan and was able to repeat key elements of the plan.   No orders of the defined types were placed in this encounter.    Requested Prescriptions    No prescriptions requested or ordered in this encounter    No follow-ups on file.  Karle Plumber, MD, FACP

## 2020-02-26 LAB — BASIC METABOLIC PANEL
BUN/Creatinine Ratio: 12 (ref 9–20)
BUN: 26 mg/dL — ABNORMAL HIGH (ref 6–24)
CO2: 22 mmol/L (ref 20–29)
Calcium: 9.6 mg/dL (ref 8.7–10.2)
Chloride: 102 mmol/L (ref 96–106)
Creatinine, Ser: 2.16 mg/dL — ABNORMAL HIGH (ref 0.76–1.27)
GFR calc Af Amer: 42 mL/min/{1.73_m2} — ABNORMAL LOW (ref >59)
GFR calc non Af Amer: 36 mL/min/{1.73_m2} — ABNORMAL LOW (ref >59)
Glucose: 84 mg/dL (ref 65–99)
Potassium: 4.1 mmol/L (ref 3.5–5.2)
Sodium: 138 mmol/L (ref 134–144)

## 2020-02-26 MED FILL — cloNIDine HCL 0.1 MG TABS: 0.1 | 30 days supply | Qty: 60 | Fill #0

## 2020-02-26 MED FILL — busPIRone HCL 7.5 MG TABS: 7.5 | 20 days supply | Qty: 60 | Fill #0

## 2020-02-26 NOTE — Progress Notes (Signed)
Kidney function is still abnormal.  Avoid taking over-the-counter pain medications.  We will plan to refer him to a kidney specialist once he has his insurance.

## 2020-03-29 ENCOUNTER — Ambulatory Visit: Payer: Self-pay | Admitting: Internal Medicine

## 2020-08-08 ENCOUNTER — Encounter (HOSPITAL_COMMUNITY): Payer: Self-pay | Admitting: Emergency Medicine

## 2020-08-08 ENCOUNTER — Other Ambulatory Visit: Payer: Self-pay

## 2020-08-08 ENCOUNTER — Inpatient Hospital Stay (HOSPITAL_COMMUNITY)
Admission: EM | Admit: 2020-08-08 | Discharge: 2020-08-11 | DRG: 311 | Disposition: A | Discharge status: Home / Self Care | Payer: Medicaid Other | Attending: Cardiovascular Disease | Admitting: Cardiovascular Disease

## 2020-08-08 ENCOUNTER — Emergency Department (HOSPITAL_COMMUNITY): Payer: Medicaid Other

## 2020-08-08 DIAGNOSIS — R079 Chest pain, unspecified: Secondary | ICD-10-CM | POA: Diagnosis not present

## 2020-08-08 DIAGNOSIS — I252 Old myocardial infarction: Secondary | ICD-10-CM

## 2020-08-08 DIAGNOSIS — Z880 Allergy status to penicillin: Secondary | ICD-10-CM | POA: Diagnosis not present

## 2020-08-08 DIAGNOSIS — Z91018 Allergy to other foods: Secondary | ICD-10-CM | POA: Diagnosis not present

## 2020-08-08 DIAGNOSIS — N1831 Chronic kidney disease, stage 3a: Secondary | ICD-10-CM | POA: Diagnosis present

## 2020-08-08 DIAGNOSIS — Z7982 Long term (current) use of aspirin: Secondary | ICD-10-CM | POA: Diagnosis not present

## 2020-08-08 DIAGNOSIS — I129 Hypertensive chronic kidney disease with stage 1 through stage 4 chronic kidney disease, or unspecified chronic kidney disease: Secondary | ICD-10-CM | POA: Diagnosis present

## 2020-08-08 DIAGNOSIS — Z83438 Family history of other disorder of lipoprotein metabolism and other lipidemia: Secondary | ICD-10-CM | POA: Diagnosis not present

## 2020-08-08 DIAGNOSIS — F17210 Nicotine dependence, cigarettes, uncomplicated: Secondary | ICD-10-CM | POA: Diagnosis present

## 2020-08-08 DIAGNOSIS — N185 Chronic kidney disease, stage 5: Secondary | ICD-10-CM | POA: Diagnosis present

## 2020-08-08 DIAGNOSIS — I10 Essential (primary) hypertension: Secondary | ICD-10-CM | POA: Diagnosis present

## 2020-08-08 DIAGNOSIS — Z79899 Other long term (current) drug therapy: Secondary | ICD-10-CM

## 2020-08-08 DIAGNOSIS — I248 Other forms of acute ischemic heart disease: Principal | ICD-10-CM | POA: Diagnosis present

## 2020-08-08 DIAGNOSIS — E785 Hyperlipidemia, unspecified: Secondary | ICD-10-CM | POA: Diagnosis present

## 2020-08-08 DIAGNOSIS — E1122 Type 2 diabetes mellitus with diabetic chronic kidney disease: Secondary | ICD-10-CM | POA: Diagnosis present

## 2020-08-08 DIAGNOSIS — Z6838 Body mass index (BMI) 38.0-38.9, adult: Secondary | ICD-10-CM

## 2020-08-08 DIAGNOSIS — E669 Obesity, unspecified: Secondary | ICD-10-CM | POA: Diagnosis present

## 2020-08-08 DIAGNOSIS — Z833 Family history of diabetes mellitus: Secondary | ICD-10-CM

## 2020-08-08 DIAGNOSIS — I313 Pericardial effusion (noninflammatory): Secondary | ICD-10-CM | POA: Diagnosis present

## 2020-08-08 DIAGNOSIS — F419 Anxiety disorder, unspecified: Secondary | ICD-10-CM | POA: Diagnosis present

## 2020-08-08 DIAGNOSIS — Z20822 Contact with and (suspected) exposure to covid-19: Secondary | ICD-10-CM | POA: Diagnosis present

## 2020-08-08 DIAGNOSIS — Z7951 Long term (current) use of inhaled steroids: Secondary | ICD-10-CM

## 2020-08-08 DIAGNOSIS — I2510 Atherosclerotic heart disease of native coronary artery without angina pectoris: Secondary | ICD-10-CM | POA: Diagnosis present

## 2020-08-08 DIAGNOSIS — D631 Anemia in chronic kidney disease: Secondary | ICD-10-CM | POA: Diagnosis present

## 2020-08-08 DIAGNOSIS — R0789 Other chest pain: Secondary | ICD-10-CM | POA: Diagnosis present

## 2020-08-08 DIAGNOSIS — R072 Precordial pain: Secondary | ICD-10-CM

## 2020-08-08 DIAGNOSIS — N186 End stage renal disease: Secondary | ICD-10-CM | POA: Diagnosis present

## 2020-08-08 DIAGNOSIS — N184 Chronic kidney disease, stage 4 (severe): Secondary | ICD-10-CM | POA: Diagnosis present

## 2020-08-08 HISTORY — DX: Unspecified asthma, uncomplicated: J45.909

## 2020-08-08 HISTORY — DX: Angina pectoris, unspecified: I20.9

## 2020-08-08 HISTORY — DX: Atherosclerotic heart disease of native coronary artery without angina pectoris: I25.10

## 2020-08-08 HISTORY — DX: Anxiety disorder, unspecified: F41.9

## 2020-08-08 LAB — BASIC METABOLIC PANEL
Anion gap: 12 (ref 5–15)
BUN: 34 mg/dL — ABNORMAL HIGH (ref 6–20)
CO2: 23 mmol/L (ref 22–32)
Calcium: 8.8 mg/dL — ABNORMAL LOW (ref 8.9–10.3)
Chloride: 101 mmol/L (ref 98–111)
Creatinine, Ser: 2.62 mg/dL — ABNORMAL HIGH (ref 0.61–1.24)
GFR, Estimated: 30 mL/min — ABNORMAL LOW (ref >60)
Glucose, Bld: 94 mg/dL (ref 70–99)
Potassium: 3.6 mmol/L (ref 3.5–5.1)
Sodium: 136 mmol/L (ref 135–145)

## 2020-08-08 LAB — CBC WITH DIFFERENTIAL/PLATELET
Abs Immature Granulocytes: 0.01 10*3/uL (ref 0.00–0.07)
Basophils Absolute: 0 10*3/uL (ref 0.0–0.1)
Basophils Relative: 1 %
Eosinophils Absolute: 0.1 10*3/uL (ref 0.0–0.5)
Eosinophils Relative: 1 %
HCT: 38.3 % — ABNORMAL LOW (ref 39.0–52.0)
Hemoglobin: 12.1 g/dL — ABNORMAL LOW (ref 13.0–17.0)
Immature Granulocytes: 0 %
Lymphocytes Relative: 37 %
Lymphs Abs: 2.4 10*3/uL (ref 0.7–4.0)
MCH: 23.7 pg — ABNORMAL LOW (ref 26.0–34.0)
MCHC: 31.6 g/dL (ref 30.0–36.0)
MCV: 75.1 fL — ABNORMAL LOW (ref 80.0–100.0)
Monocytes Absolute: 0.5 10*3/uL (ref 0.1–1.0)
Monocytes Relative: 8 %
Neutro Abs: 3.5 10*3/uL (ref 1.7–7.7)
Neutrophils Relative %: 53 %
Platelets: 174 10*3/uL (ref 150–400)
RBC: 5.1 MIL/uL (ref 4.22–5.81)
RDW: 15.9 % — ABNORMAL HIGH (ref 11.5–15.5)
WBC: 6.4 10*3/uL (ref 4.0–10.5)
nRBC: 0 % (ref 0.0–0.2)

## 2020-08-08 LAB — RESP PANEL BY RT-PCR (FLU A&B, COVID) ARPGX2
Influenza A by PCR: NEGATIVE
Influenza B by PCR: NEGATIVE
SARS Coronavirus 2 by RT PCR: NEGATIVE

## 2020-08-08 LAB — HIV ANTIBODY (ROUTINE TESTING W REFLEX): HIV Screen 4th Generation wRfx: NONREACTIVE

## 2020-08-08 LAB — HEPARIN LEVEL (UNFRACTIONATED): Heparin Unfractionated: 0.23 IU/mL — ABNORMAL LOW (ref 0.30–0.70)

## 2020-08-08 LAB — TROPONIN I (HIGH SENSITIVITY)
Troponin I (High Sensitivity): 27 ng/L — ABNORMAL HIGH (ref <18)
Troponin I (High Sensitivity): 29 ng/L — ABNORMAL HIGH (ref <18)

## 2020-08-08 LAB — BRAIN NATRIURETIC PEPTIDE: B Natriuretic Peptide: 134.8 pg/mL — ABNORMAL HIGH (ref 0.0–100.0)

## 2020-08-08 MED ORDER — SODIUM CHLORIDE 0.9 % IV SOLN: INTRAVENOUS | Status: DC

## 2020-08-08 MED ORDER — ASPIRIN 81 MG PO CHEW
324.0000 mg | CHEWABLE_TABLET | ORAL | Status: AC
  Administered 2020-08-08: 15:00:00 324 mg via ORAL
  Filled 2020-08-08: qty 4

## 2020-08-08 MED ORDER — ATORVASTATIN CALCIUM 40 MG PO TABS
40.0000 mg | ORAL_TABLET | Freq: Every day | ORAL | Status: DC
  Administered 2020-08-08 – 2020-08-09 (×2): 40 mg via ORAL
  Filled 2020-08-08 (×2): qty 1

## 2020-08-08 MED ORDER — HEPARIN BOLUS VIA INFUSION
2000.0000 [IU] | Freq: Once | INTRAVENOUS | Status: AC
  Administered 2020-08-08: 23:00:00 2000 [IU] via INTRAVENOUS
  Filled 2020-08-08: qty 2000

## 2020-08-08 MED ORDER — ONDANSETRON HCL 4 MG/2ML IJ SOLN
4.0000 mg | Freq: Once | INTRAMUSCULAR | Status: AC
  Administered 2020-08-08: 08:00:00 4 mg via INTRAVENOUS
  Filled 2020-08-08: qty 2

## 2020-08-08 MED ORDER — AMLODIPINE BESYLATE 10 MG PO TABS
10.0000 mg | ORAL_TABLET | Freq: Every day | ORAL | Status: DC
  Administered 2020-08-08 – 2020-08-11 (×4): 10 mg via ORAL
  Filled 2020-08-08: qty 2
  Filled 2020-08-08 (×3): qty 1

## 2020-08-08 MED ORDER — METOPROLOL TARTRATE 25 MG PO TABS
25.0000 mg | ORAL_TABLET | Freq: Two times a day (BID) | ORAL | Status: DC
  Administered 2020-08-08 – 2020-08-09 (×3): 25 mg via ORAL
  Filled 2020-08-08 (×3): qty 1

## 2020-08-08 MED ORDER — LORAZEPAM 1 MG PO TABS
1.0000 mg | ORAL_TABLET | Freq: Once | ORAL | Status: AC
  Administered 2020-08-08: 07:00:00 1 mg via ORAL
  Filled 2020-08-08: qty 1

## 2020-08-08 MED ORDER — ONDANSETRON HCL 4 MG/2ML IJ SOLN: 4.0000 mg | Freq: Four times a day (QID) | INTRAMUSCULAR | Status: DC | PRN

## 2020-08-08 MED ORDER — ASPIRIN EC 81 MG PO TBEC
81.0000 mg | DELAYED_RELEASE_TABLET | Freq: Every day | ORAL | Status: DC
  Administered 2020-08-09 – 2020-08-11 (×3): 81 mg via ORAL
  Filled 2020-08-08 (×3): qty 1

## 2020-08-08 MED ORDER — BUSPIRONE HCL 5 MG PO TABS
7.5000 mg | ORAL_TABLET | Freq: Three times a day (TID) | ORAL | Status: DC
  Administered 2020-08-08 – 2020-08-11 (×10): 7.5 mg via ORAL
  Filled 2020-08-08 (×3): qty 2
  Filled 2020-08-08: qty 1
  Filled 2020-08-08 (×6): qty 2
  Filled 2020-08-08: qty 1

## 2020-08-08 MED ORDER — HYDRALAZINE HCL 25 MG PO TABS
25.0000 mg | ORAL_TABLET | Freq: Four times a day (QID) | ORAL | Status: DC
  Administered 2020-08-08 – 2020-08-09 (×3): 25 mg via ORAL
  Filled 2020-08-08 (×3): qty 1

## 2020-08-08 MED ORDER — ACETAMINOPHEN 500 MG PO TABS
1000.0000 mg | ORAL_TABLET | Freq: Once | ORAL | Status: AC
  Administered 2020-08-08: 07:00:00 1000 mg via ORAL
  Filled 2020-08-08: qty 2

## 2020-08-08 MED ORDER — ASPIRIN EC 81 MG PO TBEC: 81.0000 mg | DELAYED_RELEASE_TABLET | Freq: Every day | ORAL | Status: DC

## 2020-08-08 MED ORDER — ACETAMINOPHEN 325 MG PO TABS
650.0000 mg | ORAL_TABLET | ORAL | Status: DC | PRN
  Administered 2020-08-11: 650 mg via ORAL
  Filled 2020-08-08 (×2): qty 2

## 2020-08-08 MED ORDER — HEPARIN BOLUS VIA INFUSION
4000.0000 [IU] | Freq: Once | INTRAVENOUS | Status: AC
  Administered 2020-08-08: 15:00:00 4000 [IU] via INTRAVENOUS
  Filled 2020-08-08: qty 4000

## 2020-08-08 MED ORDER — CLONIDINE HCL 0.2 MG PO TABS
0.2000 mg | ORAL_TABLET | Freq: Once | ORAL | Status: AC
  Administered 2020-08-08: 08:00:00 0.2 mg via ORAL
  Filled 2020-08-08: qty 1

## 2020-08-08 MED ORDER — CLONIDINE HCL 0.1 MG PO TABS
0.1000 mg | ORAL_TABLET | Freq: Two times a day (BID) | ORAL | Status: DC
  Administered 2020-08-08 – 2020-08-09 (×2): 0.1 mg via ORAL
  Filled 2020-08-08 (×2): qty 1

## 2020-08-08 MED ORDER — MORPHINE SULFATE (PF) 4 MG/ML IV SOLN
4.0000 mg | Freq: Once | INTRAVENOUS | Status: AC
  Administered 2020-08-08: 08:00:00 4 mg via INTRAVENOUS
  Filled 2020-08-08: qty 1

## 2020-08-08 MED ORDER — ASPIRIN 300 MG RE SUPP
300.0000 mg | RECTAL | Status: AC
Start: 2020-08-08 — End: 2020-08-08

## 2020-08-08 MED ORDER — ALBUTEROL SULFATE HFA 108 (90 BASE) MCG/ACT IN AERS
2.0000 | INHALATION_SPRAY | Freq: Four times a day (QID) | RESPIRATORY_TRACT | Status: DC | PRN
  Filled 2020-08-08: qty 6.7

## 2020-08-08 MED ORDER — CLONIDINE HCL 0.1 MG PO TABS: 0.1000 mg | ORAL_TABLET | Freq: Two times a day (BID) | ORAL | Status: DC

## 2020-08-08 MED ORDER — NICOTINE 21 MG/24HR TD PT24
21.0000 mg | MEDICATED_PATCH | Freq: Every day | TRANSDERMAL | Status: DC
  Administered 2020-08-08 – 2020-08-11 (×4): 21 mg via TRANSDERMAL
  Filled 2020-08-08 (×4): qty 1

## 2020-08-08 MED ORDER — HEPARIN (PORCINE) 25000 UT/250ML-% IV SOLN
1400.0000 [IU]/h | INTRAVENOUS | Status: DC
  Administered 2020-08-08: 15:00:00 1200 [IU]/h via INTRAVENOUS
  Administered 2020-08-09 – 2020-08-10 (×2): 1400 [IU]/h via INTRAVENOUS
  Filled 2020-08-08 (×3): qty 250

## 2020-08-08 MED ORDER — NITROGLYCERIN 0.4 MG SL SUBL: 0.4000 mg | SUBLINGUAL_TABLET | SUBLINGUAL | Status: DC | PRN

## 2020-08-08 NOTE — ED Triage Notes (Signed)
Patient arrived with EMS from home woke up this morning 0430 with central chest pain radiating to left arm with SOB . He received 4 NTG sl and ASA 324 mg by EMS with mild relief . History of MI. Patient added constipation this week with productive cough .

## 2020-08-08 NOTE — ED Provider Notes (Signed)
[facility] EMERGENCY DEPARTMENT Provider Note   CSN: [id number] Arrival date & time: 08/08/20  0554     History Chief Complaint  Patient presents with  . Chest Pain    George Day is a 44 y.o. male.  Patient presents to the emergency department for evaluation of chest pain and shortness of breath.  Patient reports that he was awakened from sleep this morning with severe central chest pain that felt like someone sitting on his chest.  He was very short of breath as well.  EMS administered multiple sublingual nitroglycerin and aspirin and patient has had significant relief.        Past Medical History:  Diagnosis Date  . Anxiety   . Hypertension   . MI (myocardial infarction) [facility])     Patient Active Problem List   Diagnosis Date Noted  . GAD (generalized anxiety disorder) 02/25/2020  . Hyperlipidemia 02/25/2020  . Stage 3a chronic kidney disease ([place]) 02/25/2020  . Diabetes mellitus ([place]) 01/05/2020  . Essential hypertension 01/05/2020  . Tobacco dependence 01/05/2020  . Coronary artery disease involving native coronary artery of native heart without angina pectoris 01/05/2020  . History of CVA with residual deficit 01/05/2020  . Moderate persistent asthma without complication 29/93/7169  . Class 1 obesity due to excess calories with serious comorbidity and body mass index (BMI) of 32.0 to 32.9 in adult 01/05/2020    History reviewed. No pertinent surgical history.     Family History  Problem Relation Age of Onset  . Diabetes Mother   . Hyperlipidemia Mother   . Diabetes Sister   . Hyperlipidemia Sister   . Hearing loss Sister   . Diabetes Brother   . Hyperlipidemia Brother     Social History   Tobacco Use  . Smoking status: Current Every Day Smoker    Types: Cigars  . Smokeless tobacco: Never Used  Vaping Use  . Vaping Use: Never used  Substance Use Topics  . Alcohol use: Yes    Comment: occasioanlly   . Drug use: Never     Home Medications Prior to Admission medications   Medication Sig Start Date End Date Taking? Authorizing Provider  albuterol (VENTOLIN HFA) 108 (90 Base) MCG/ACT inhaler Inhale 2 puffs into the lungs every 6 (six) hours as needed for wheezing or shortness of breath. 01/05/20   Ladell Pier, MD  amLODipine (NORVASC) 10 MG tablet Take 1 tablet (10 mg total) by mouth daily. For blood pressure 01/19/20   Ladell Pier, MD  aspirin EC 81 MG tablet Take 1 tablet (81 mg total) by mouth daily. 01/05/20   Ladell Pier, MD  atorvastatin (LIPITOR) 40 MG tablet Take 1 tablet (40 mg total) by mouth daily. 01/06/20   Ladell Pier, MD  busPIRone (BUSPAR) 7.5 MG tablet Take 1 tablet (7.5 mg total) by mouth 3 (three) times daily. 02/25/20   Ladell Pier, MD  cloNIDine (CATAPRES) 0.1 MG tablet Take 1 tablet (0.1 mg total) by mouth 2 (two) times daily. 02/25/20   Ladell Pier, MD  Fluticasone-Salmeterol (ADVAIR DISKUS) 250-50 MCG/DOSE AEPB Inhale 1 puff into the lungs 2 (two) times daily. 01/05/20   Ladell Pier, MD  lisinopril (ZESTRIL) 10 MG tablet Take 1 tablet (10 mg total) by mouth daily. 01/05/20   Ladell Pier, MD  metoprolol tartrate (LOPRESSOR) 25 MG tablet Take 1 tablet (25 mg total) by mouth 2 (two) times daily. 01/05/20   Ladell Pier, MD  nicotine (NICODERM CQ - DOSED IN MG/24 HOURS) 21 mg/24hr patch Place 1 patch (21 mg total) onto the skin daily. 02/12/20   Ladell Pier, MD  nitroGLYCERIN (NITROSTAT) 0.6 MG SL tablet Place 0.6 mg under the tongue every 5 (five) minutes as needed for chest pain.    [provider]    Allergies    Penicillins  Review of Systems   Review of Systems  Respiratory: Positive for shortness of breath.   Cardiovascular: Positive for chest pain.  All other systems reviewed and are negative.   Physical Exam Updated Vital Signs BP (!) 186/134   Pulse 78   Temp 98.7 F (37.1 C) (Oral)   Resp (!) 23   Ht 5'  9" (1.753 m)   Wt 118.8 kg   SpO2 99%   BMI 38.69 kg/m   Physical Exam Vitals and nursing note reviewed.  Constitutional:      General: He is not in acute distress.    Appearance: Normal appearance. He is well-developed and well-nourished.  HENT:     Head: Normocephalic and atraumatic.     Right Ear: Hearing normal.     Left Ear: Hearing normal.     Nose: Nose normal.     Mouth/Throat:     Mouth: Oropharynx is clear and moist and mucous membranes are normal.  Eyes:     Extraocular Movements: EOM normal.     Conjunctiva/sclera: Conjunctivae normal.     Pupils: Pupils are equal, round, and reactive to light.  Cardiovascular:     Rate and Rhythm: Regular rhythm.     Heart sounds: S1 normal and S2 normal. No murmur heard. No friction rub. No gallop.   Pulmonary:     Effort: Pulmonary effort is normal. No respiratory distress.     Breath sounds: Normal breath sounds.  Chest:     Chest wall: No tenderness.  Abdominal:     General: Bowel sounds are normal.     Palpations: Abdomen is soft. There is no hepatosplenomegaly.     Tenderness: There is no abdominal tenderness. There is no guarding or rebound. Negative signs include Murphy's sign and McBurney's sign.     Hernia: No hernia is present.  Musculoskeletal:        General: Normal range of motion.     Cervical back: Normal range of motion and neck supple.  Skin:    General: Skin is warm, dry and intact.     Findings: No rash.     Nails: There is no cyanosis.  Neurological:     Mental Status: He is alert and oriented to person, place, and time.     GCS: GCS eye subscore is 4. GCS verbal subscore is 5. GCS motor subscore is 6.     Cranial Nerves: No cranial nerve deficit.     Sensory: No sensory deficit.     Coordination: Coordination normal.     Deep Tendon Reflexes: Strength normal.  Psychiatric:        Mood and Affect: Mood is anxious.        Speech: Speech normal.        Behavior: Behavior normal.        Thought  Content: Thought content normal.     ED Results / Procedures / Treatments   Labs (all labs ordered are listed, but only abnormal results are displayed) Labs Reviewed  RESP PANEL BY RT-PCR (FLU A&B, COVID) ARPGX2  CBC WITH DIFFERENTIAL/PLATELET  BASIC METABOLIC PANEL  BRAIN NATRIURETIC PEPTIDE  TROPONIN I (HIGH SENSITIVITY)    EKG EKG Interpretation  Date/Time:  Monday August 08 2020 05:57:51 EST Ventricular Rate:  80 PR Interval:    QRS Duration: 91 QT Interval:  387 QTC Calculation: 447 R Axis:   18 Text Interpretation: Sinus rhythm Left atrial enlargement Probable anterior infarct, old Minimal ST elevation, lateral leads Confirmed by Orpah Greek [phone]) on 08/08/2020 6:02:37 AM   Radiology DG Chest Port 1 View  Result Date: 08/08/2020 CLINICAL DATA:  Chest pain.  Shortness of breath. EXAM: PORTABLE CHEST 1 VIEW COMPARISON:  No prior. FINDINGS: Mediastinum and hilar structures normal. Cardiomegaly. No pulmonary venous congestion. No focal infiltrate. No pleural effusion or pneumothorax. Mild thoracic spine scoliosis. IMPRESSION: Cardiomegaly. No pulmonary venous congestion. No acute pulmonary disease. Electronically Signed   By: Marcello Moores  Register   On: 08/08/2020 06:37    Procedures Procedures (including critical care time)  Medications Ordered in ED Medications  LORazepam (ATIVAN) tablet 1 mg (1 mg Oral Given 08/08/20 0636)  acetaminophen (TYLENOL) tablet 1,000 mg (1,000 mg Oral Given 08/08/20 9794)    ED Course  I have reviewed the triage vital signs and the nursing notes.  Pertinent labs & imaging results that were available during my care of the patient were reviewed by me and considered in my medical decision making (see chart for details).    MDM Rules/Calculators/A&P                          Patient presents to the emergency department for evaluation of chest pain.  Patient had central chest pain that awakened him from sleep.  He had associated  shortness of breath and radiation to the left arm.  Pain has now resolved after nitroglycerin and aspirin provided by EMS.  Patient with some residual anxiety.  Will treat anxiety to see if his blood pressure improves.  Patient has multiple cardiac risk factors.  He reports a history of CAD.  Patient reports having a heart attack in 2010 when he was living in [place].  He relates having a cardiac catheterization but did not have a stent placed.  Patient is at high risk for recurrent CAD.  Work-up initiated, will require cardiology consultation.  Will sign out to oncoming ER physician to follow-up on results.  Final Clinical Impression(s) / ED Diagnoses Final diagnoses:  Precordial pain    Rx / DC Orders ED Discharge Orders    None       Shonique Pelphrey, Gwenyth Allegra, MD 08/08/20 [phone]

## 2020-08-08 NOTE — Progress Notes (Signed)
ANTICOAGULATION CONSULT NOTE - Initial Consult  Pharmacy Consult for heparin Indication: chest pain/ACS  Allergies  Allergen Reactions  . Penicillins Anaphylaxis    ALL  . Tomato Swelling    Patient Measurements: Height: 5\' 9"  (175.3 cm) Weight: 99.7 kg (219 lb 14.4 oz) IBW/kg (Calculated) : 70.7 Heparin Dosing Weight: 97.5kg  Vital Signs: Temp: 98.2 F (36.8 C) (12/20 2115) Temp Source: Oral (12/20 2115) BP: 151/98 (12/20 2115) Pulse Rate: 65 (12/20 2115)  Labs: Recent Labs    08/08/20 0600 08/08/20 1006 08/08/20 2123  HGB 12.1*  --   --   HCT 38.3*  --   --   PLT 174  --   --   HEPARINUNFRC  --   --  0.23*  CREATININE 2.62*  --   --   TROPONINIHS 27* 29*  --     Estimated Creatinine Clearance: 41.9 mL/min (A) (by C-G formula based on SCr of 2.62 mg/dL (H)).   Medical History: Past Medical History:  Diagnosis Date  . Anemia 1998  . Anginal pain ([place])   . Anxiety   . Asthma   . Coronary artery disease   . Diabetes mellitus without complication ([place]) 2595  . Hypertension 2019  . MI (myocardial infarction) ([place]) 2010  . Stroke [facility]) 2010   Assessment: 30 YOM presenting with CP, hx of CAD, not on anticoagulation PTA.  H/H stable, plts 174.  HL 0.23 which is subtherapeutic    Goal of Therapy:  Heparin level 0.3-0.7 units/ml Monitor platelets by anticoagulation protocol: Yes   Plan:  Heparin 2000 units IV x 1, and gtt at 1400 units/hr F/u 6 hour heparin level, cards plan for [facility]  Alanda Slim, PharmD, [facility] Clinical Pharmacist Please see AMION for all Pharmacists' Contact Phone Numbers 08/08/2020, 10:36 PM

## 2020-08-08 NOTE — ED Provider Notes (Signed)
7:29 AM Care assumed from Dr. Betsey Holiday.  At time of transfer care, patient is awaiting a call from cardiology to evaluate.  Patient has concerning chest pain that is pressure in central chest radiating into his arm that was relieved with nitroglycerin and history of MI 11 years ago.  Initial troponin was slightly elevated at 27.  EKG showed some ST elevation was there was no STEMI seen.  Cardiology will be called for further recommendations.  Cardiology will admit.   Asuncion Tapscott, Gwenyth Allegra, MD 08/08/20 1438

## 2020-08-08 NOTE — Progress Notes (Signed)
ANTICOAGULATION CONSULT NOTE - Initial Consult  Pharmacy Consult for heparin Indication: chest pain/ACS  Allergies  Allergen Reactions  . Penicillins Anaphylaxis    ALL  . Tomato Swelling    Patient Measurements: Height: 5\' 9"  (175.3 cm) Weight: 118.8 kg (262 lb) IBW/kg (Calculated) : 70.7 Heparin Dosing Weight: 97.5kg  Vital Signs: Temp: 98.7 F (37.1 C) (12/20 0555) Temp Source: Oral (12/20 0555) BP: 173/117 (12/20 1200) Pulse Rate: 68 (12/20 1200)  Labs: Recent Labs    08/08/20 0600 08/08/20 1006  HGB 12.1*  --   HCT 38.3*  --   PLT 174  --   CREATININE 2.62*  --   TROPONINIHS 27* 29*    Estimated Creatinine Clearance: 45.8 mL/min (A) (by C-G formula based on SCr of 2.62 mg/dL (H)).   Medical History: Past Medical History:  Diagnosis Date  . Anxiety   . Hypertension   . MI (myocardial infarction) ([place])    Assessment: 61 YOM presenting with CP, hx of CAD, not on anticoagulation PTA.  H/H stable, plts 174.    Goal of Therapy:  Heparin level 0.3-0.7 units/ml Monitor platelets by anticoagulation protocol: Yes   Plan:  Heparin 4000 units IV x 1, and gtt at 1200 units/hr F/u 6 hour heparin level, cards plan for [facility]  Bertis Ruddy, PharmD Clinical Pharmacist ED Pharmacist Phone # [phone] 08/08/2020 12:50 PM

## 2020-08-08 NOTE — H&P (Addendum)
Cardiology Admission History and Physical:   Patient ID: George Day MRN: [id number]; DOB: 1975-10-24   Admission date: 08/08/2020  Primary Care Provider: Ladell Pier, MD [facility] Cardiologist: New to [facility]  Chief Complaint:  Chest pain   Patient Profile:   George Day is a 44 y.o. male with a hx of CAD s/p MI in 2010 with LHC but no PCI (per pt report in [place], [place]), HLD, HTN, DM2, tobacco use, anxiety, CVA, asthma, and obesity who presented to the ED 08/08/20 with chest pain.   History of Present Illness:   [name] is a 44yo M with a hx as stated above who presented to [facility] on 08/08/20 with chest pain which woke him from his sleep early this AM. He states that on waking he had significant anterior chest pressure that radiated to his left arm. He was also very SOB. He states that he recently moved to [place] from [place] several months ago and has been under more stress. He moved to be near his finance and has and a lot of problems with finding a job since moving. He states that he had an MI in 2010 at which time he underwent a cardiac cath that showed what sounds like non-obstructive CAD and there was no stent placed. Symptoms this morning were similar to symptoms at the time of his MI in 2010. He has been managed by his PCP since that time. He has been treated for uncontrolled HTN and is on clonidine. He reports medication compliance.  He also reports more fatigue and increased SOB over the last several months. Activities which used to be easy ofr him have now become increasingly difficult. Given his symptoms he called EMS for transport to the ED for further evaluation.   On EMS arrival, he was given SL NTG with complete relief. He was also given ASA at that time. He has multiple CV risk factors including DM2, HTN, HLD and prior MI. EKG with NSR, anterior Q waves, and early repolarization changes in lead V3. HsT found to be 27>>29, flat trend not consistent with ACS. Creatinine  found to be elevated at 2.62 with a baseline at 1.83 in 12/2019.    Past Medical History:  Diagnosis Date   Anxiety    Hypertension    MI (myocardial infarction) ([place])     History reviewed. No pertinent surgical history.   Medications Prior to Admission: Prior to Admission medications   Medication Sig Start Date End Date Taking? Authorizing Provider  albuterol (VENTOLIN HFA) 108 (90 Base) MCG/ACT inhaler Inhale 2 puffs into the lungs every 6 (six) hours as needed for wheezing or shortness of breath. 01/05/20   Ladell Pier, MD  amLODipine (NORVASC) 10 MG tablet Take 1 tablet (10 mg total) by mouth daily. For blood pressure 01/19/20   Ladell Pier, MD  aspirin EC 81 MG tablet Take 1 tablet (81 mg total) by mouth daily. 01/05/20   Ladell Pier, MD  atorvastatin (LIPITOR) 40 MG tablet Take 1 tablet (40 mg total) by mouth daily. 01/06/20   Ladell Pier, MD  busPIRone (BUSPAR) 7.5 MG tablet Take 1 tablet (7.5 mg total) by mouth 3 (three) times daily. 02/25/20   Ladell Pier, MD  cloNIDine (CATAPRES) 0.1 MG tablet Take 1 tablet (0.1 mg total) by mouth 2 (two) times daily. 02/25/20   Ladell Pier, MD  Fluticasone-Salmeterol (ADVAIR DISKUS) 250-50 MCG/DOSE AEPB Inhale 1 puff into the lungs 2 (two) times daily. 01/05/20  Ladell Pier, MD  lisinopril (ZESTRIL) 10 MG tablet Take 1 tablet (10 mg total) by mouth daily. 01/05/20   Ladell Pier, MD  metoprolol tartrate (LOPRESSOR) 25 MG tablet Take 1 tablet (25 mg total) by mouth 2 (two) times daily. 01/05/20   Ladell Pier, MD  nicotine (NICODERM CQ - DOSED IN MG/24 HOURS) 21 mg/24hr patch Place 1 patch (21 mg total) onto the skin daily. 02/12/20   Ladell Pier, MD  nitroGLYCERIN (NITROSTAT) 0.6 MG SL tablet Place 0.6 mg under the tongue every 5 (five) minutes as needed for chest pain.    [provider]     Allergies:    Allergies  Allergen Reactions   Penicillins Anaphylaxis    ALL   Tomato  Swelling    Social History:   Social History   Socioeconomic History   Marital status: Single    Spouse name: Not on file   Number of children: 3   Years of education: Not on file   Highest education level: Not on file  Occupational History   Not on file  Tobacco Use   Smoking status: Current Every Day Smoker    Types: Cigars   Smokeless tobacco: Never Used  Vaping Use   Vaping Use: Never used  Substance and Sexual Activity   Alcohol use: Yes    Comment: occasioanlly    Drug use: Never   Sexual activity: Not on file  Other Topics Concern   Not on file  Social History Narrative   Not on file   Social Determinants of Health   Financial Resource Strain: Not on file  Food Insecurity: Not on file  Transportation Needs: Not on file  Physical Activity: Not on file  Stress: Not on file  Social Connections: Not on file  Intimate Partner Violence: Not on file    Family History:   The patient's family history includes Diabetes in his brother, mother, and sister; Hearing loss in his sister; Hyperlipidemia in his brother, mother, and sister.    ROS:  Please see the history of present illness.  All other ROS reviewed and negative.     Physical Exam/Data:   Vitals:   08/08/20 1045 08/08/20 1100 08/08/20 1115 08/08/20 1200  BP: (!) 169/115 (!) 169/108 (!) 173/108 (!) 173/117  Pulse: 65 67 65 68  Resp: 20 19 18  (!) 21  Temp:      TempSrc:      SpO2: 98% 98% 98% 100%  Weight:      Height:       No intake or output data in the 24 hours ending 08/08/20 1241 Last 3 Weights 08/08/2020 02/25/2020 01/05/2020  Weight (lbs) 262 lb 223 lb 9.6 oz 219 lb 3.2 oz  Weight (kg) 118.842 kg 101.424 kg 99.428 kg     Body mass index is 38.69 kg/m.   General: Obese, NAD Neck: Negative for carotid bruits. No JVD Lungs:Clear to ausculation bilaterally. No wheezes, rales, or rhonchi. Breathing is unlabored. Cardiovascular: RRR with S1 S2. No murmurs Abdomen: Soft, non-tender,  non-distended. No obvious abdominal masses. Extremities: No edema. Radial pulses 2+ bilaterally Neuro: Alert and oriented. No focal deficits. No facial asymmetry. MAE spontaneously. Psych: Responds to questions appropriately with normal affect.    EKG:  The ECG that was done 08/08/20 NSR with HR 80bpm , evidence of old anterior MI and early repolarization changes.   Relevant CV Studies:  None  Laboratory Data:  High Sensitivity Troponin:   Recent  Labs  Lab 08/08/20 0600 08/08/20 1006  TROPONINIHS 27* 29*      Chemistry Recent Labs  Lab 08/08/20 0600  NA 136  K 3.6  CL 101  CO2 23  GLUCOSE 94  BUN 34*  CREATININE 2.62*  CALCIUM 8.8*  GFRNONAA 30*  ANIONGAP 12    No results for input(s): PROT, ALBUMIN, AST, ALT, ALKPHOS, BILITOT in the last 168 hours. Hematology Recent Labs  Lab 08/08/20 0600  WBC 6.4  RBC 5.10  HGB 12.1*  HCT 38.3*  MCV 75.1*  MCH 23.7*  MCHC 31.6  RDW 15.9*  PLT 174   BNP Recent Labs  Lab 08/08/20 0600  BNP 134.8*    DDimer No results for input(s): DDIMER in the last 168 hours.   Radiology/Studies:  DG Chest Port 1 View  Result Date: 08/08/2020 CLINICAL DATA:  Chest pain.  Shortness of breath. EXAM: PORTABLE CHEST 1 VIEW COMPARISON:  No prior. FINDINGS: Mediastinum and hilar structures normal. Cardiomegaly. No pulmonary venous congestion. No focal infiltrate. No pleural effusion or pneumothorax. Mild thoracic spine scoliosis. IMPRESSION: Cardiomegaly. No pulmonary venous congestion. No acute pulmonary disease. Electronically Signed   By: Marcello Moores  Register   On: 08/08/2020 06:37   Assessment and Plan:   1. Chest pain with known CAD: -Pt presented with acute onset of anterior chest pain which began while sleeping this AM. He had pain radiation to his left arm with associated SOB. On EMS arrival, he was given SL NTG with relief. Reports intermittent chest pain over the years however this was similar to prior MI in 2010. He also reports  increased fatigue and SOB over the last several months.  -LHC in 2010 showed what sounds like non-obstructive CAD with no stent placement. He took a SL NTG with relief. He was also given ASA per EMS at that time.  -He has multiple CV risk factors including uncontrolled HTN, HLD and prior MI with unknown coronary disease  -EKG with NSR, anterior Q waves, and minimal ST changes in lead V3. HsT found to be 27>>29, flat trend not consistent with ACS. Elevation likely secondary to markedly elevated BO and renal dysfunction  -Creatinine found to be elevated at 2.62 with a baseline at 1.83 in 12/2019 -Given no recurrent symptoms and stable troponin levels, will work on better BP control for now and plan LHC once BP and renal function improved -Will obtain echocardiogram to assess LV function -Continue ASA, statin, beta blocker -Start IV Heparin infusion per pharm   2. Uncontrolled HTN: -Elevated, 173/117>173/108>169/108 -On PTA metoprolol 25mg  BID, amlodipine 10, clonidine 0.1 BID, lisinopril 10 -Would hold lisinopril given renal dysfunction  -Add hydralazine 25mg   -Refer to OP HTN clinic at d/c   3. HLD: -Last LDL, 111 on 01/05/20 -Continue statin   4. Tobacco use: -Reports ongoing use at 1/2pk/day  -Cessation strongly encouraged   5. DM2: -Last Hb A1c, 5.6 on 01/06/20  -Previously treated with metformin however was taken off given stable HbA1c -SSI for glucose control while inpatient status   6. Acute on chronic kidney injury stage III: -Creatinine, 2.62 today  -Most recent baseline for comparison from 12/2019 at 1.83 -Will stop lisinopril and follow kidney function closely  -Start IVF at 50/hr       HEAR Score (for undifferentiated chest pain):  HEAR Score: 5       Severity of Illness: The appropriate patient status for this patient is INPATIENT. Inpatient status is judged to be reasonable and necessary in order  to provide the required intensity of service to ensure the patient's  safety. The patient's presenting symptoms, physical exam findings, and initial radiographic and laboratory data in the context of their chronic comorbidities is felt to place them at high risk for further clinical deterioration. Furthermore, it is not anticipated that the patient will be medically stable for discharge from the hospital within 2 midnights of admission. The following factors support the patient status of inpatient.   " The patient's presenting symptoms include chest pain. " The worrisome physical exam findings include hx if CAD and CV risk factors. " The initial radiographic and laboratory data are worrisome because of elevated HsT. " The chronic co-morbidities include uncontrolled HTN, HLD, tobacco use, CAD.   * I certify that at the point of admission it is my clinical judgment that the patient will require inpatient hospital care spanning beyond 2 midnights from the point of admission due to high intensity of service, high risk for further deterioration and high frequency of surveillance required.*    For questions or updates, please contact [place] Please consult [email or web address] for contact info under     Signed, Kathyrn Drown, NP  08/08/2020 12:41 PM   Agree with note by Kathyrn Drown NP  44 year old pleasant mildly overweight engaged African-American male father of 3 daughters recently relocated from [place] to [place].  We are asked to see him because of chest pain.  He was awakened early this morning with chest pain which was ultimately relieved with several sublingual nitroglycerin.  Is a history of CAD status post cath in [place] in 2010 the specifics not available.  He does have difficult to control hypertension and diet-controlled diabetes.  His moderate renal insufficiency.  Is currently pain-free.  His EKG shows LVH.  His enzymes are low.  His exam is benign.  His serum creatinine was 2.6.  We will admit him, place him on IV heparin, hold  lisinopril and hydrate him.  Ultimately he will need diagnostic angiography once his renal function improves.  We will also address his poorly controlled hypertension and add hydralazine 25 mg p.o. 3 times daily.   Lorretta Harp, M.D., [place], [facility], [place] [address]. [place], [place]  [address]  [phone] 08/08/2020 12:49 PM

## 2020-08-09 ENCOUNTER — Inpatient Hospital Stay (HOSPITAL_COMMUNITY): Payer: Medicaid Other

## 2020-08-09 DIAGNOSIS — I10 Essential (primary) hypertension: Secondary | ICD-10-CM

## 2020-08-09 DIAGNOSIS — R079 Chest pain, unspecified: Secondary | ICD-10-CM

## 2020-08-09 LAB — BASIC METABOLIC PANEL
Anion gap: 8 (ref 5–15)
BUN: 33 mg/dL — ABNORMAL HIGH (ref 6–20)
CO2: 24 mmol/L (ref 22–32)
Calcium: 8.8 mg/dL — ABNORMAL LOW (ref 8.9–10.3)
Chloride: 105 mmol/L (ref 98–111)
Creatinine, Ser: 2.67 mg/dL — ABNORMAL HIGH (ref 0.61–1.24)
GFR, Estimated: 29 mL/min — ABNORMAL LOW (ref >60)
Glucose, Bld: 112 mg/dL — ABNORMAL HIGH (ref 70–99)
Potassium: 3.7 mmol/L (ref 3.5–5.1)
Sodium: 137 mmol/L (ref 135–145)

## 2020-08-09 LAB — CBC
HCT: 37.8 % — ABNORMAL LOW (ref 39.0–52.0)
Hemoglobin: 12.2 g/dL — ABNORMAL LOW (ref 13.0–17.0)
MCH: 23.8 pg — ABNORMAL LOW (ref 26.0–34.0)
MCHC: 32.3 g/dL (ref 30.0–36.0)
MCV: 73.7 fL — ABNORMAL LOW (ref 80.0–100.0)
Platelets: 161 10*3/uL (ref 150–400)
RBC: 5.13 MIL/uL (ref 4.22–5.81)
RDW: 15.9 % — ABNORMAL HIGH (ref 11.5–15.5)
WBC: 6.7 10*3/uL (ref 4.0–10.5)
nRBC: 0 % (ref 0.0–0.2)

## 2020-08-09 LAB — LIPID PANEL
Cholesterol: 137 mg/dL (ref 0–200)
HDL: 44 mg/dL (ref >40)
LDL Cholesterol: 71 mg/dL (ref 0–99)
Total CHOL/HDL Ratio: 3.1 RATIO
Triglycerides: 110 mg/dL (ref <150)
VLDL: 22 mg/dL (ref 0–40)

## 2020-08-09 LAB — ECHOCARDIOGRAM COMPLETE
Area-P 1/2: 2.76 cm2
Height: 69 in
S' Lateral: 3.2 cm
Weight: 3518.4 oz

## 2020-08-09 LAB — HEPARIN LEVEL (UNFRACTIONATED): Heparin Unfractionated: 0.49 IU/mL (ref 0.30–0.70)

## 2020-08-09 MED ORDER — CARVEDILOL 6.25 MG PO TABS
6.2500 mg | ORAL_TABLET | Freq: Two times a day (BID) | ORAL | Status: DC
  Administered 2020-08-09 – 2020-08-10 (×2): 6.25 mg via ORAL
  Filled 2020-08-09 (×2): qty 1

## 2020-08-09 MED ORDER — ATORVASTATIN CALCIUM 80 MG PO TABS
80.0000 mg | ORAL_TABLET | Freq: Every day | ORAL | Status: DC
  Administered 2020-08-10 – 2020-08-11 (×2): 80 mg via ORAL
  Filled 2020-08-09 (×2): qty 1

## 2020-08-09 MED ORDER — HYDRALAZINE HCL 50 MG PO TABS
50.0000 mg | ORAL_TABLET | Freq: Three times a day (TID) | ORAL | Status: DC
  Administered 2020-08-09 – 2020-08-10 (×4): 50 mg via ORAL
  Filled 2020-08-09 (×4): qty 1

## 2020-08-09 NOTE — Progress Notes (Signed)
Echocardiogram 2D Echocardiogram has been performed.  George Day 08/09/2020, 12:54 PM

## 2020-08-09 NOTE — Progress Notes (Signed)
Nutrition Brief Note  Patient identified on the Malnutrition Screening Tool (MST) Report. He reports recent intentional weight loss. States he is trying to get to 200 lbs.  Intake PTA and since admission has been good.   Wt Readings from Last 15 Encounters:  08/08/20 99.7 kg  02/25/20 101.4 kg  01/05/20 99.4 kg    Body mass index is 32.47 kg/m. Patient meets criteria for obesity based on current BMI.   Current diet order is heart healthy, patient is consuming approximately 95% of meals at this time. Labs and medications reviewed.   No nutrition interventions warranted at this time. If nutrition issues arise, please consult RD.   George Day, RD, LDN, CNSC Please refer to [facility] for contact information.

## 2020-08-09 NOTE — Progress Notes (Addendum)
Progress Note  Patient Name: George Day Date of Encounter: 08/09/2020  [facility] Cardiologist: Quay Burow, MD   Subjective   No recurrent chest pain overnight.  Major issue continues to be blood pressure control.  We are aggressively adding antihypertensive medications.  Inpatient Medications    Scheduled Meds: . amLODipine  10 mg Oral Daily  . aspirin EC  81 mg Oral Daily  . atorvastatin  40 mg Oral Daily  . busPIRone  7.5 mg Oral TID  . cloNIDine  0.1 mg Oral BID  . hydrALAZINE  50 mg Oral Q8H  . metoprolol tartrate  25 mg Oral BID  . nicotine  21 mg Transdermal Daily   Continuous Infusions: . sodium chloride 50 mL/hr at 08/08/20 1452  . heparin 1,400 Units/hr (08/09/20 0438)   PRN Meds: acetaminophen, albuterol, nitroGLYCERIN, ondansetron (ZOFRAN) IV   Vital Signs    Vitals:   08/09/20 0127 08/09/20 0434 08/09/20 0757 08/09/20 0846  BP: (!) 149/99 (!) 157/101 (!) 185/110 (!) 183/100  Pulse: 68 62 75   Resp: 20 18 16    Temp: 97.8 F (36.6 C) 98.3 F (36.8 C) 98.2 F (36.8 C)   TempSrc: Oral Oral Oral   SpO2: 99% 100% 100%   Weight:      Height:        Intake/Output Summary (Last 24 hours) at 08/09/2020 0852 Last data filed at 08/09/2020 0758 Gross per 24 hour  Intake 240 ml  Output 520 ml  Net -280 ml   Last 3 Weights 08/08/2020 08/08/2020 02/25/2020  Weight (lbs) 219 lb 14.4 oz 262 lb 223 lb 9.6 oz  Weight (kg) 99.746 kg 118.842 kg 101.424 kg      Telemetry    Sinus rhythm - Personally Reviewed  ECG    Sinus rhythm with rate 70 bpm, LVH with early repol, TWI in lateral leads, no STE/D, no significant change from previous. - Personally Reviewed  Physical Exam   GEN: No acute distress.   Neck: No JVD Cardiac: RRR, no murmurs, rubs, or gallops.  Respiratory: Clear to auscultation bilaterally. GI: Soft, nontender, non-distended  MS: No edema; No deformity. Neuro:  Nonfocal  Psych: Normal affect   Labs    High Sensitivity  Troponin:   Recent Labs  Lab 08/08/20 0600 08/08/20 1006  TROPONINIHS 27* 29*      Chemistry Recent Labs  Lab 08/08/20 0600 08/09/20 0242  NA 136 137  K 3.6 3.7  CL 101 105  CO2 23 24  GLUCOSE 94 112*  BUN 34* 33*  CREATININE 2.62* 2.67*  CALCIUM 8.8* 8.8*  GFRNONAA 30* 29*  ANIONGAP 12 8     Hematology Recent Labs  Lab 08/08/20 0600 08/09/20 0242  WBC 6.4 6.7  RBC 5.10 5.13  HGB 12.1* 12.2*  HCT 38.3* 37.8*  MCV 75.1* 73.7*  MCH 23.7* 23.8*  MCHC 31.6 32.3  RDW 15.9* 15.9*  PLT 174 161    BNP Recent Labs  Lab 08/08/20 0600  BNP 134.8*     DDimer No results for input(s): DDIMER in the last 168 hours.   Radiology    DG Chest Port 1 View  Result Date: 08/08/2020 CLINICAL DATA:  Chest pain.  Shortness of breath. EXAM: PORTABLE CHEST 1 VIEW COMPARISON:  No prior. FINDINGS: Mediastinum and hilar structures normal. Cardiomegaly. No pulmonary venous congestion. No focal infiltrate. No pleural effusion or pneumothorax. Mild thoracic spine scoliosis. IMPRESSION: Cardiomegaly. No pulmonary venous congestion. No acute pulmonary disease. Electronically Signed  By: Hansell   On: 08/08/2020 06:37    Cardiac Studies   Echo pending  Patient Profile     44 y.o. male with a PMH of CAD s/p MI in 2010 with LHC but no PCI (per pt report this was in [place], [place]), HTN, HLD, DM type 2, CVA, asthma, tobacco abuse, and obesity, who presented with chest pain.   Assessment & Plan    1. Chest pain with known CAD: patient presented with chest pain radiating to his left side with associated SOB and relief with SL nitro. Per patient report he had a LHC in 2010, though no stents were placed at that time (presumed non-obstructive disease). EKG this admission with anterior Q waves though no STE/D. HsTrop 27>29; low flat trend not c/w ACS. Suspected demand ischemia in the setting of markedly elevated BP and renal dysfunction.  - Await echo to evaluate LV function - if  reassuring, likely plan outpatient NST to r/o ischemia - Continue IV heparin for now - Continue BBlocker - Continue aspirin and statin  2. HTN: BP markedly elevated this admission. Home lisinopril held in the setting of AoCKD. He was started on hydralazine 25mg  q6h, though BP remains quite high - Will increase hydralazine to 50mg  TID - Will transition to carvedilol 6.25mg  BID and uptitrate as needed - Continue amlodipine 10mg  daily - Will stop clonidine at this time as he was not on this medication for the past several months and not ideal in patient with questionable compliance issues.   3. HLD: LDL 71 on labs this morning.  - Will increase atorvastatin to 80mg  daily to reach goal LDL <70  4. DM type 2: A1C 5.6 12/2019. No currently on medications - Continue ISS this admission - Needs to establish care with PCP in [place] - will place CM consult to assist.   5. Tobacco abuse: still smoking 1/2 ppd - Continue to encourage smoking cessation  6. AoCKD stage 3: Cr continues to be 2.6 today despite holding home lisinopril and receiving IVF bolus yesterday. Cr was 1.8 12/2019 and 2.1 02/2020.  - Will need to establish care with nephrology outpatient   For questions or updates, please contact [place] Please consult [email or web address] for contact info under        Signed, Abigail Butts, PA-C  08/09/2020, 8:52 AM     Agree with assessment and plan by Roby Lofts PA-C  No further chest pain.  Enzymes low and flat.  EKG shows LVH with repolarization changes.  2D echo pending.  Antihypertensive medications have been adjusted including addition of carvedilol and hydralazine.  We will continue to monitor.  Exam is benign.  Serum creatinine remains elevated at 2.6 probably from hypertensive renal disease.  Plan [place] as an outpatient.   Lorretta Harp, M.D., [place], [facility], [place] [address] [place], [place]   [address]  [phone] 08/09/2020 9:38 AM

## 2020-08-09 NOTE — Progress Notes (Signed)
ANTICOAGULATION CONSULT NOTE - Initial Consult  Pharmacy Consult for heparin Indication: chest pain/ACS  Allergies  Allergen Reactions  . Penicillins Anaphylaxis    ALL  . Tomato Swelling    Patient Measurements: Height: 5\' 9"  (175.3 cm) Weight: 99.7 kg (219 lb 14.4 oz) IBW/kg (Calculated) : 70.7 Heparin Dosing Weight: 97.5kg  Vital Signs: Temp: 98.3 F (36.8 C) (12/21 0434) Temp Source: Oral (12/21 0434) BP: 157/101 (12/21 0434) Pulse Rate: 62 (12/21 0434)  Labs: Recent Labs    08/08/20 0600 08/08/20 1006 08/08/20 2123 08/09/20 0242  HGB 12.1*  --   --  12.2*  HCT 38.3*  --   --  37.8*  PLT 174  --   --  161  HEPARINUNFRC  --   --  0.23* 0.49  CREATININE 2.62*  --   --  2.67*  TROPONINIHS 27* 29*  --   --     Estimated Creatinine Clearance: 41.1 mL/min (A) (by C-G formula based on SCr of 2.67 mg/dL (H)).   Medical History: Past Medical History:  Diagnosis Date  . Anemia 1998  . Anginal pain ([place])   . Anxiety   . Asthma   . Coronary artery disease   . Diabetes mellitus without complication ([place]) 8453  . Hypertension 2019  . MI (myocardial infarction) ([place]) 2010  . Stroke [facility]) 2010   Assessment: 76 YOM presenting with CP, hx of CAD, not on anticoagulation PTA.  H/H stable, plts 174.  HL 0.49 which is therapeutic on 1400 units/hr. No bleeding noted. CBC stable.   Goal of Therapy:  Heparin level 0.3-0.7 units/ml Monitor platelets by anticoagulation protocol: Yes   Plan:  Continue heparin at 1400/hr Follow up cards plan  Erin Hearing PharmD., BCPS Clinical Pharmacist 08/09/2020 6:50 AM

## 2020-08-10 LAB — BASIC METABOLIC PANEL
Anion gap: 11 (ref 5–15)
BUN: 25 mg/dL — ABNORMAL HIGH (ref 6–20)
CO2: 21 mmol/L — ABNORMAL LOW (ref 22–32)
Calcium: 9.2 mg/dL (ref 8.9–10.3)
Chloride: 103 mmol/L (ref 98–111)
Creatinine, Ser: 2.27 mg/dL — ABNORMAL HIGH (ref 0.61–1.24)
GFR, Estimated: 36 mL/min — ABNORMAL LOW (ref >60)
Glucose, Bld: 91 mg/dL (ref 70–99)
Potassium: 3.7 mmol/L (ref 3.5–5.1)
Sodium: 135 mmol/L (ref 135–145)

## 2020-08-10 LAB — CBC
HCT: 36.7 % — ABNORMAL LOW (ref 39.0–52.0)
Hemoglobin: 11.8 g/dL — ABNORMAL LOW (ref 13.0–17.0)
MCH: 23.6 pg — ABNORMAL LOW (ref 26.0–34.0)
MCHC: 32.2 g/dL (ref 30.0–36.0)
MCV: 73.4 fL — ABNORMAL LOW (ref 80.0–100.0)
Platelets: 176 10*3/uL (ref 150–400)
RBC: 5 MIL/uL (ref 4.22–5.81)
RDW: 16.1 % — ABNORMAL HIGH (ref 11.5–15.5)
WBC: 5.6 10*3/uL (ref 4.0–10.5)
nRBC: 0 % (ref 0.0–0.2)

## 2020-08-10 LAB — HEPARIN LEVEL (UNFRACTIONATED): Heparin Unfractionated: 0.31 IU/mL (ref 0.30–0.70)

## 2020-08-10 MED ORDER — CARVEDILOL 12.5 MG PO TABS
12.5000 mg | ORAL_TABLET | Freq: Two times a day (BID) | ORAL | Status: DC
  Administered 2020-08-10: 17:00:00 12.5 mg via ORAL
  Filled 2020-08-10: qty 1

## 2020-08-10 MED ORDER — HYDRALAZINE HCL 50 MG PO TABS
75.0000 mg | ORAL_TABLET | Freq: Three times a day (TID) | ORAL | Status: DC
  Administered 2020-08-10 – 2020-08-11 (×3): 75 mg via ORAL
  Filled 2020-08-10 (×3): qty 1

## 2020-08-10 MED ORDER — CARVEDILOL 6.25 MG PO TABS
6.2500 mg | ORAL_TABLET | Freq: Once | ORAL | Status: AC
  Administered 2020-08-10: 10:00:00 6.25 mg via ORAL
  Filled 2020-08-10: qty 1

## 2020-08-10 NOTE — Discharge Instructions (Signed)

## 2020-08-10 NOTE — Progress Notes (Signed)
[place] for heparin Indication: chest pain/ACS  Allergies  Allergen Reactions  . Penicillins Anaphylaxis    ALL  . Tomato Swelling    Patient Measurements: Height: 5\' 9"  (175.3 cm) Weight: 98.1 kg (216 lb 4.8 oz) IBW/kg (Calculated) : 70.7 Heparin Dosing Weight: 97.5kg  Vital Signs: Temp: 98.3 F (36.8 C) (12/22 0503) Temp Source: Oral (12/22 0503) BP: 179/107 (12/22 0503) Pulse Rate: 79 (12/22 0503)  Labs: Recent Labs    08/08/20 0600 08/08/20 1006 08/08/20 2123 08/09/20 0242 08/10/20 0242  HGB 12.1*  --   --  12.2* 11.8*  HCT 38.3*  --   --  37.8* 36.7*  PLT 174  --   --  161 176  HEPARINUNFRC  --   --  0.23* 0.49 0.31  CREATININE 2.62*  --   --  2.67*  --   TROPONINIHS 27* 29*  --   --   --     Estimated Creatinine Clearance: 40.8 mL/min (A) (by C-G formula based on SCr of 2.67 mg/dL (H)).   Medical History: Past Medical History:  Diagnosis Date  . Anemia 1998  . Anginal pain ([place])   . Anxiety   . Asthma   . Coronary artery disease   . Diabetes mellitus without complication ([place]) 7092  . Hypertension 2019  . MI (myocardial infarction) ([place]) 2010  . Stroke [facility]) 2010   Assessment: 59 YOM presenting with CP, hx of CAD, not on anticoagulation PTA.  H/H stable, plts 174.  Heparin level is therapeutic on low end of goal at 0.31, on 1400 units/hr. Hgb 11.8, plt 176. No s/sx of bleeding or infusion issues.   Goal of Therapy:  Heparin level 0.3-0.7 units/ml Monitor platelets by anticoagulation protocol: Yes   Plan:  Continue heparin at 1400 units/hr Discussed with cardiology and okay to stop heparin infusion today  Antonietta Jewel, PharmD, [place] Pharmacist  Phone: [phone] 08/10/2020 7:27 AM  Please check AMION for all [place] phone numbers After 10:00 PM, call [place] [phone]

## 2020-08-10 NOTE — Progress Notes (Addendum)
Progress Note  Patient Name: George Day Date of Encounter: 08/10/2020  [facility] Cardiologist: Quay Burow, MD   Subjective   No complaints, no chest pain or SOB. Concerned about his BP still elevated.     Inpatient Medications    Scheduled Meds:  amLODipine  10 mg Oral Daily   aspirin EC  81 mg Oral Daily   atorvastatin  80 mg Oral Daily   busPIRone  7.5 mg Oral TID   carvedilol  6.25 mg Oral BID WC   hydrALAZINE  50 mg Oral Q8H   nicotine  21 mg Transdermal Daily   Continuous Infusions:  sodium chloride 50 mL/hr at 08/09/20 1438   heparin 1,400 Units/hr (08/10/20 0028)   PRN Meds: acetaminophen, albuterol, nitroGLYCERIN, ondansetron (ZOFRAN) IV   Vital Signs    Vitals:   08/09/20 1711 08/09/20 2025 08/10/20 0503 08/10/20 0754  BP: (!) 179/111 (!) 183/110 (!) 179/107 (!) 194/105  Pulse: 80 82 79 84  Resp:  20 20 18   Temp:  97.9 F (36.6 C) 98.3 F (36.8 C) 98.2 F (36.8 C)  TempSrc:  Oral Oral Oral  SpO2:  99% 99% 98%  Weight:   98.1 kg   Height:   5\' 9"  (1.753 m)     Intake/Output Summary (Last 24 hours) at 08/10/2020 0845 Last data filed at 08/10/2020 0806 Gross per 24 hour  Intake 934.57 ml  Output 3630 ml  Net -2695.43 ml   Last 3 Weights 08/10/2020 08/08/2020 08/08/2020  Weight (lbs) 216 lb 4.8 oz 219 lb 14.4 oz 262 lb  Weight (kg) 98.113 kg 99.746 kg 118.842 kg      Telemetry    SR to ST  - Personally Reviewed  ECG    No new - Personally Reviewed  Physical Exam   GEN: No acute distress.   Neck: No JVD Cardiac: RRR, no murmurs, rubs, or gallops.  Respiratory: Clear to auscultation bilaterally. GI: Soft, nontender, non-distended  MS: No edema; No deformity. Neuro:  Nonfocal  Psych: Normal affect   Labs    High Sensitivity Troponin:   Recent Labs  Lab 08/08/20 0600 08/08/20 1006  TROPONINIHS 27* 29*      Chemistry Recent Labs  Lab 08/08/20 0600 08/09/20 0242  NA 136 137  K 3.6 3.7  CL 101 105  CO2 23 24   GLUCOSE 94 112*  BUN 34* 33*  CREATININE 2.62* 2.67*  CALCIUM 8.8* 8.8*  GFRNONAA 30* 29*  ANIONGAP 12 8     Hematology Recent Labs  Lab 08/08/20 0600 08/09/20 0242 08/10/20 0242  WBC 6.4 6.7 5.6  RBC 5.10 5.13 5.00  HGB 12.1* 12.2* 11.8*  HCT 38.3* 37.8* 36.7*  MCV 75.1* 73.7* 73.4*  MCH 23.7* 23.8* 23.6*  MCHC 31.6 32.3 32.2  RDW 15.9* 15.9* 16.1*  PLT 174 161 176    BNP Recent Labs  Lab 08/08/20 0600  BNP 134.8*     DDimer No results for input(s): DDIMER in the last 168 hours.   Radiology    ECHOCARDIOGRAM COMPLETE  Result Date: 08/09/2020    ECHOCARDIOGRAM REPORT   Patient Name:   George Day Date of Exam: 08/09/2020 Medical Rec #:  [id number]      Height:       69.0 in Accession #:    [id number]     Weight:       219.9 lb Date of Birth:  07-Jul-1976       BSA:  2.151 m Patient Age:    44 years       BP:           178/104 mmHg Patient Gender: M              HR:           72 bpm. Exam Location:  Inpatient Procedure: 2D Echo, Color Doppler and Cardiac Doppler Indications:    R07.9* Chest pain, unspecified  History:        Patient has no prior history of Echocardiogram examinations.                 CAD; Risk Factors:Hypertension, Diabetes and Dyslipidemia.  Sonographer:    Raquel Sarna Senior RDCS Referring Phys: [place]  1. Blood pressure is significantly elevated. Left ventricular ejection fraction, by estimation, is 50 to 55%. The left ventricle has low normal function. The left ventricle has no regional wall motion abnormalities. There is moderate left ventricular hypertrophy. Left ventricular diastolic parameters are consistent with Grade II diastolic dysfunction (pseudonormalization). Elevated left ventricular end-diastolic pressure.  2. Right ventricular systolic function is normal. The right ventricular size is normal.  3. Left atrial size was mildly dilated.  4. A trivial to small pericardial effusion is present. The pericardial effusion  is circumferential.  5. The mitral valve is abnormal. Trivial mitral valve regurgitation.  6. The aortic valve is tricuspid. Aortic valve regurgitation is not visualized.  7. The inferior vena cava is normal in size with greater than 50% respiratory variability, suggesting right atrial pressure of 3 mmHg. Comparison(s): No prior Echocardiogram. FINDINGS  Left Ventricle: Blood pressure is significantly elevated. Left ventricular ejection fraction, by estimation, is 50 to 55%. The left ventricle has low normal function. The left ventricle has no regional wall motion abnormalities. The left ventricular internal cavity size was normal in size. There is moderate left ventricular hypertrophy. Left ventricular diastolic parameters are consistent with Grade II diastolic dysfunction (pseudonormalization). Elevated left ventricular end-diastolic pressure. Right Ventricle: The right ventricular size is normal. No increase in right ventricular wall thickness. Right ventricular systolic function is normal. Left Atrium: Left atrial size was mildly dilated. Right Atrium: Right atrial size was normal in size. Pericardium: A trivial to small pericardial effusion is present. The pericardial effusion is circumferential. Mitral Valve: The mitral valve is abnormal. There is mild thickening of the mitral valve leaflet(s). Trivial mitral valve regurgitation. Tricuspid Valve: The tricuspid valve is grossly normal. Tricuspid valve regurgitation is trivial. Aortic Valve: The aortic valve is tricuspid. Aortic valve regurgitation is not visualized. Pulmonic Valve: The pulmonic valve was normal in structure. Pulmonic valve regurgitation is not visualized. Aorta: The aortic root and ascending aorta are structurally normal, with no evidence of dilitation. Venous: The inferior vena cava is normal in size with greater than 50% respiratory variability, suggesting right atrial pressure of 3 mmHg. IAS/Shunts: No atrial level shunt detected by color  flow Doppler.  LEFT VENTRICLE PLAX 2D LVIDd:         4.40 cm  Diastology LVIDs:         3.20 cm  LV e' medial:    7.40 cm/s LV PW:         1.60 cm  LV E/e' medial:  12.7 LV IVS:        1.40 cm  LV e' lateral:   5.33 cm/s LVOT diam:     2.00 cm  LV E/e' lateral: 17.7 LV SV:  63 LV SV Index:   29 LVOT Area:     3.14 cm  RIGHT VENTRICLE RV S prime:     10.20 cm/s TAPSE (M-mode): 2.2 cm LEFT ATRIUM             Index       RIGHT ATRIUM           Index LA diam:        4.70 cm 2.19 cm/m  RA Area:     16.90 cm LA Vol (A2C):   78.5 ml 36.49 ml/m RA Volume:   38.00 ml  17.67 ml/m LA Vol (A4C):   69.1 ml 32.12 ml/m LA Biplane Vol: 74.5 ml 34.64 ml/m  AORTIC VALVE LVOT Vmax:   107.00 cm/s LVOT Vmean:  69.700 cm/s LVOT VTI:    0.201 m  AORTA Ao Root diam: 2.80 cm MITRAL VALVE MV Area (PHT): 2.76 cm    SHUNTS MV Decel Time: 275 msec    Systemic VTI:  0.20 m MV E velocity: 94.30 cm/s  Systemic Diam: 2.00 cm MV A velocity: 43.70 cm/s MV E/A ratio:  2.16 Lyman Bishop MD Electronically signed by Lyman Bishop MD Signature Date/Time: 08/09/2020/1:05:52 PM    Final     Cardiac Studies   Echo  IMPRESSIONS     1. Blood pressure is significantly elevated. Left ventricular ejection  fraction, by estimation, is 50 to 55%. The left ventricle has low normal  function. The left ventricle has no regional wall motion abnormalities.  There is moderate left ventricular  hypertrophy. Left ventricular diastolic parameters are consistent with  Grade II diastolic dysfunction (pseudonormalization). Elevated left  ventricular end-diastolic pressure.   2. Right ventricular systolic function is normal. The right ventricular  size is normal.   3. Left atrial size was mildly dilated.   4. A trivial to small pericardial effusion is present. The pericardial  effusion is circumferential.   5. The mitral valve is abnormal. Trivial mitral valve regurgitation.   6. The aortic valve is tricuspid. Aortic valve regurgitation is  not  visualized.   7. The inferior vena cava is normal in size with greater than 50%  respiratory variability, suggesting right atrial pressure of 3 mmHg.   Comparison(s): No prior Echocardiogram.   FINDINGS   Left Ventricle: Blood pressure is significantly elevated. Left  ventricular ejection fraction, by estimation, is 50 to 55%. The left  ventricle has low normal function. The left ventricle has no regional wall  motion abnormalities. The left ventricular  internal cavity size was normal in size. There is moderate left  ventricular hypertrophy. Left ventricular diastolic parameters are  consistent with Grade II diastolic dysfunction (pseudonormalization).  Elevated left ventricular end-diastolic pressure.   Right Ventricle: The right ventricular size is normal. No increase in  right ventricular wall thickness. Right ventricular systolic function is  normal.   Left Atrium: Left atrial size was mildly dilated.   Right Atrium: Right atrial size was normal in size.   Pericardium: A trivial to small pericardial effusion is present. The  pericardial effusion is circumferential.   Mitral Valve: The mitral valve is abnormal. There is mild thickening of  the mitral valve leaflet(s). Trivial mitral valve regurgitation.   Tricuspid Valve: The tricuspid valve is grossly normal. Tricuspid valve  regurgitation is trivial.   Aortic Valve: The aortic valve is tricuspid. Aortic valve regurgitation is  not visualized.   Pulmonic Valve: The pulmonic valve was normal in structure. Pulmonic valve  regurgitation is not  visualized.   Aorta: The aortic root and ascending aorta are structurally normal, with  no evidence of dilitation.   Venous: The inferior vena cava is normal in size with greater than 50%  respiratory variability, suggesting right atrial pressure of 3 mmHg.   IAS/Shunts: No atrial level shunt detected by color flow Doppler.      LEFT VENTRICLE  PLAX 2D  LVIDd:          4.40 cm  Diastology  LVIDs:         3.20 cm  LV e' medial:    7.40 cm/s  LV PW:         1.60 cm  LV E/e' medial:  12.7  LV IVS:        1.40 cm  LV e' lateral:   5.33 cm/s  LVOT diam:     2.00 cm  LV E/e' lateral: 17.7  LV SV:         63  LV SV Index:   29  LVOT Area:     3.14 cm      RIGHT VENTRICLE  RV S prime:     10.20 cm/s  TAPSE (M-mode): 2.2 cm   LEFT ATRIUM             Index       RIGHT ATRIUM           Index  LA diam:        4.70 cm 2.19 cm/m  RA Area:     16.90 cm  LA Vol (A2C):   78.5 ml 36.49 ml/m RA Volume:   38.00 ml  17.67 ml/m  LA Vol (A4C):   69.1 ml 32.12 ml/m  LA Biplane Vol: 74.5 ml 34.64 ml/m   AORTIC VALVE  LVOT Vmax:   107.00 cm/s  LVOT Vmean:  69.700 cm/s  LVOT VTI:    0.201 m     AORTA  Ao Root diam: 2.80 cm   Patient Profile     44 y.o. male with a hx of CAD s/p MI in 2010 with LHC but no PCI (per pt report in [place], [place]), HLD, HTN, DM2, tobacco use, anxiety, CVA, asthma, and obesity who presented to the ED 08/08/20 with chest pain..    Assessment & Plan    1. Chest pain with known CAD: patient presented with chest pain radiating to his left side with associated SOB and relief with SL nitro. Per patient report he had a LHC in 2010, though no stents were placed at that time (presumed non-obstructive disease). EKG this admission with anterior Q waves though no STE/D. HsTrop 27>29; low flat trend not c/w ACS. Suspected demand ischemia in the setting of markedly elevated BP and renal dysfunction.  - echo with EF 50-55%, no RWMA, moderate LVH, G2DD.  RV normal.  Trivial to small pericardial effusion.  Valves stable.  plan outpatient NST to r/o ischemia - stop IV heparin  - Continue BBlocker with elevated HR will increase to 12.5 BID - Continue aspirin and statin   2. HTN: BP markedly elevated this admission. Home lisinopril held in the setting of AoCKD. He was started on hydralazine 25mg  q6h, though BP remains quite high - increased hydralazine  to 50mg  TID yesterday and will increase to 75 TID today - transitioned to carvedilol 6.25mg  BID and today in crease to 12.5 BID - Continue amlodipine 10mg  daily - Will stop clonidine at this time as he was not on this medication for the  past several months and not ideal in patient with questionable compliance issues.    3. HLD: LDL 71 on labs this morning.  - Will increase atorvastatin to 80mg  daily to reach goal LDL <70   4. DM type 2: A1C 5.6 12/2019. No currently on medications - Continue ISS this admission - Needs to establish care with PCP in [place] - will place CM consult to assist.    5. Tobacco abuse: still smoking 1/2 ppd - Continue to encourage smoking cessation   6. AoCKD stage 3: Cr continues to be 2.6 today despite holding home lisinopril and receiving IVF bolus yesterday. Cr was 1.8 12/2019 and 2.1 02/2020.  Today 2.27 down from 2.67 on 50 cc per hour.  Will continue IV fluids  Will check renal ultrasound.   - Will need to establish care with nephrology outpatient      For questions or updates, please contact [place] Please consult [email or web address] for contact info under        Signed, Cecilie Kicks, NP  08/10/2020, 8:45 AM    Agree with note written by Cecilie Kicks RNP  Mr. Mcmanaway' blood pressure continues to be elevated despite being on multiple antihypertensive medications.  His 2D echo was essentially normal except for diastolic dysfunction moderate LVH.  His renal function remains in the mid 2 range.  I am going to uptitrate his beta-blocker and hydralazine.  We will check renal duplex for renal artery stenosis.   Quay Burow 08/10/2020 12:09 PM

## 2020-08-10 NOTE — Progress Notes (Signed)
Nutrition Brief Note  Asked by RN to talk with patient at his request. He had questions regarding how he would know which foods to order from the hospital menu. Explained that he has 700 mg sodium limit with each meal and the nutrition services ambassador who he calls to order his meals would have access to the sodium content of the menu items. The nutritional services ambassador will help him choose items for his meals so that he does not go over the 700 mg sodium limit for each meal. RD also attached low sodium education handouts from the [employer] to discharge instructions. No further nutrition interventions indicated at this time.  George Day, RD, LDN, CNSC Please refer to [facility] for contact information.

## 2020-08-10 NOTE — Plan of Care (Signed)
  Problem: Clinical Measurements: Goal: Cardiovascular complication will be avoided Outcome: Progressing   Problem: Clinical Measurements: Goal: Respiratory complications will improve Outcome: Progressing   Problem: Activity: Goal: Risk for activity intolerance will decrease Outcome: Progressing   Problem: Cardiac: Goal: Ability to achieve and maintain adequate cardiovascular perfusion will improve Outcome: Progressing

## 2020-08-11 ENCOUNTER — Encounter (HOSPITAL_COMMUNITY): Payer: Medicaid Other

## 2020-08-11 ENCOUNTER — Other Ambulatory Visit (HOSPITAL_COMMUNITY): Payer: Self-pay | Admitting: Cardiology

## 2020-08-11 ENCOUNTER — Telehealth: Payer: Self-pay | Admitting: Cardiovascular Disease

## 2020-08-11 ENCOUNTER — Inpatient Hospital Stay (HOSPITAL_COMMUNITY): Payer: Medicaid Other

## 2020-08-11 DIAGNOSIS — I10 Essential (primary) hypertension: Secondary | ICD-10-CM

## 2020-08-11 DIAGNOSIS — N1831 Chronic kidney disease, stage 3a: Secondary | ICD-10-CM

## 2020-08-11 DIAGNOSIS — E785 Hyperlipidemia, unspecified: Secondary | ICD-10-CM

## 2020-08-11 LAB — CBC
HCT: 36.2 % — ABNORMAL LOW (ref 39.0–52.0)
Hemoglobin: 12.4 g/dL — ABNORMAL LOW (ref 13.0–17.0)
MCH: 24.6 pg — ABNORMAL LOW (ref 26.0–34.0)
MCHC: 34.3 g/dL (ref 30.0–36.0)
MCV: 71.7 fL — ABNORMAL LOW (ref 80.0–100.0)
Platelets: 176 10*3/uL (ref 150–400)
RBC: 5.05 MIL/uL (ref 4.22–5.81)
RDW: 15.9 % — ABNORMAL HIGH (ref 11.5–15.5)
WBC: 5.8 10*3/uL (ref 4.0–10.5)
nRBC: 0 % (ref 0.0–0.2)

## 2020-08-11 LAB — BASIC METABOLIC PANEL
Anion gap: 10 (ref 5–15)
BUN: 27 mg/dL — ABNORMAL HIGH (ref 6–20)
CO2: 20 mmol/L — ABNORMAL LOW (ref 22–32)
Calcium: 8.9 mg/dL (ref 8.9–10.3)
Chloride: 105 mmol/L (ref 98–111)
Creatinine, Ser: 2.61 mg/dL — ABNORMAL HIGH (ref 0.61–1.24)
GFR, Estimated: 30 mL/min — ABNORMAL LOW (ref >60)
Glucose, Bld: 104 mg/dL — ABNORMAL HIGH (ref 70–99)
Potassium: 3.9 mmol/L (ref 3.5–5.1)
Sodium: 135 mmol/L (ref 135–145)

## 2020-08-11 MED ORDER — SALINE SPRAY 0.65 % NA SOLN
1.0000 | NASAL | Status: DC | PRN
  Filled 2020-08-11: qty 44

## 2020-08-11 MED ORDER — CARVEDILOL 25 MG PO TABS: 25.0000 mg | ORAL_TABLET | Freq: Two times a day (BID) | ORAL | 1 refills | Status: DC

## 2020-08-11 MED ORDER — HYDRALAZINE HCL 100 MG PO TABS: 100.0000 mg | ORAL_TABLET | Freq: Three times a day (TID) | ORAL | 1 refills | Status: DC

## 2020-08-11 MED ORDER — HYDRALAZINE HCL 50 MG PO TABS
100.0000 mg | ORAL_TABLET | Freq: Three times a day (TID) | ORAL | Status: DC
  Administered 2020-08-11: 16:00:00 100 mg via ORAL
  Filled 2020-08-11: qty 2

## 2020-08-11 MED ORDER — CARVEDILOL 25 MG PO TABS
25.0000 mg | ORAL_TABLET | Freq: Two times a day (BID) | ORAL | Status: DC
  Administered 2020-08-11: 09:00:00 25 mg via ORAL
  Filled 2020-08-11: qty 1

## 2020-08-11 MED FILL — hydrALAZINE HCL 100 MG TABS: 100 | 30 days supply | Qty: 90 | Fill #0

## 2020-08-11 MED FILL — CARVEDILOL 25 MG TABLET: 25 | 30 days supply | Qty: 60 | Fill #0

## 2020-08-11 NOTE — Care Management (Addendum)
08-11-20 1604 Patient has a scheduled appointment at the [facility] and [facility] Appointment placed on the AVS. Patient is working on getting Medicaid for [place]- he has been in [place] for 2 months. No further needs from Case Manager at this time. Bethena Roys, RN,BSN Case Manager    1630 08-11-20 Patient [facility] the [facility]. Pharmacy closes at 5:15 and Staff RN is going over discharge information. No further needs from Case Manager at this time. Bethena Roys , RN,BSN Case Manager

## 2020-08-11 NOTE — Telephone Encounter (Signed)
Patient has a TOC appointment with Almyra Deforest on 08/16/2020 at 2:45 pm per Reino Bellis.

## 2020-08-11 NOTE — Progress Notes (Signed)
Renal US completed.   Please see CV Proc for preliminary results.   Vonzell Schlatter, RVT

## 2020-08-11 NOTE — Discharge Summary (Addendum)
Discharge Summary    Patient ID: George Day MRN: [id number]; DOB: Apr 04, 1976  Admit date: 08/08/2020 Discharge date: 08/11/2020  Primary Care Provider: Ladell Pier, MD  Primary Cardiologist: Quay Burow, MD  Primary Electrophysiologist:  None   Discharge Diagnoses    Principal Problem:   Chest pain Active Problems:   Essential hypertension   Hyperlipidemia   Stage 3a chronic kidney disease [facility])  Diagnostic Studies/Procedures    Renal Duplex: 08/11/20  Summary:  Renal:     Right: Normal size right kidney. No evidence of right renal artery         stenosis.  Left:  Normal size of left kidney. No evidence of left renal artery         stenosis.  Mesenteric:  Normal Celiac artery and Superior Mesenteric artery findings.   Echo: 08/09/20  IMPRESSIONS     1. Blood pressure is significantly elevated. Left ventricular ejection  fraction, by estimation, is 50 to 55%. The left ventricle has low normal  function. The left ventricle has no regional wall motion abnormalities.  There is moderate left ventricular  hypertrophy. Left ventricular diastolic parameters are consistent with  Grade II diastolic dysfunction (pseudonormalization). Elevated left  ventricular end-diastolic pressure.   2. Right ventricular systolic function is normal. The right ventricular  size is normal.   3. Left atrial size was mildly dilated.   4. A trivial to small pericardial effusion is present. The pericardial  effusion is circumferential.   5. The mitral valve is abnormal. Trivial mitral valve regurgitation.   6. The aortic valve is tricuspid. Aortic valve regurgitation is not  visualized.   7. The inferior vena cava is normal in size with greater than 50%  respiratory variability, suggesting right atrial pressure of 3 mmHg.   Comparison(s): No prior Echocardiogram.  _____________   History of Present Illness     George Day is a 44 y.o. male with a hx of CAD s/p MI in  2010 with LHC but no PCI (per pt report in [place], [place]), HLD, HTN, DM2, tobacco use, anxiety, CVA, asthma, and obesity who presented to the ED 08/08/20 with chest pain.   George Day is a 44yo M with a hx as stated above who presented to [facility] on 08/08/20 with chest pain which woke him from his sleep early this AM. He stated that on waking he had significant anterior chest pressure that radiated to his left arm. He was also very SOB. He stated that he recently moved to [place] from [place] several months ago and had been under more stress. He moved to be near his finance and has had a lot of problems with finding a job since moving. He states that he had an MI in 2010 at which time he underwent a cardiac cath that showed what sounds like non-obstructive CAD and there was no stent placed. Symptoms the morning were similar to symptoms at the time of his MI in 2010. He has been managed by his PCP since that time. He has been treated for uncontrolled HTN and is on clonidine. He reports medication compliance.  He also reported more fatigue and increased SOB over the last several months. Activities which used to be easy ofr him have now become increasingly difficult. Given his symptoms he called EMS for transport to the ED for further evaluation.    On EMS arrival, he was given SL NTG with complete relief. He was also given ASA at that time. He has multiple CV  risk factors including DM2, HTN, HLD and prior MI. EKG with NSR, anterior Q waves, and early repolarization changes in lead V3. HsT found to be 27>>29, flat trend not consistent with ACS. Creatinine found to be elevated at 2.62 with a baseline at 1.83 in 12/2019.     Hospital Course     1. Chest pain with known CAD: patient presented with chest pain radiating to his left side with associated SOB and relief with SL nitro. Per patient report he had a LHC in 2010, though no stents were placed at that time (presumed non-obstructive disease). EKG this admission with  anterior Q waves though no STE/D. HsTrop 27>29; low flat trend not c/w ACS. Felt to be demand ischemia in the setting of markedly elevated BP and renal dysfunction.  -- echo this admission with EF 50-55%, no RWMA, moderate LVH, G2DD.  RV normal. Trivial to small pericardial effusion.  Valves stable.  Plan outpatient NST to r/o ischemia -- Treated with briefly with IV heparin -- his medications were titrated to Coreg 25mg  BID, hydralazine 100mg  TID, and norvasc 10mg  daily -- Continue aspirin and statin   2. HTN: BP markedly elevated this admission, and home ACEi held in the setting of renal dysfunction.  -- discharged on coreg to 25mg  BID, Norvasc 10mg  daily and Hydralazine 100mg  TID  -- Will stop clonidine at this time as he was not on this medication for the past several months and not ideal in patient with questionable compliance issues -- he has been instructed to keep a log of his blood pressures and bring to follow up appt   3. HLD: LDL 71, Increased atorvastatin to 80mg  daily to reach goal LDL <70 -- FLP/LFTs in 8 weeks   4. DM type 2: A1C 5.6 12/2019. No currently on medications -- Plan for outpatient follow up with [place]   5. Tobacco abuse: still smoking 1/2 ppd -- Continue to encourage smoking cessation   6. CKD stage 3: Cr continues to be 2.6 despite holding home lisinopril and receiving IVFs. Cr was 1.8 12/2019 and 2.1 02/2020. Question whether this is a new baseline. -- renal US was normal -- Will need to establish care with nephrology outpatient -- BMET at follow up appt  Did the patient have an acute coronary syndrome (MI, NSTEMI, STEMI, etc) this admission?:  No.   The elevated Troponin was due to the acute medical illness (demand ischemia).      _____________  Discharge Vitals Blood pressure (!) 179/110, pulse 95, temperature 97.9 F (36.6 C), temperature source Oral, resp. rate 20, height 5\' 9"  (1.753 m), weight 97.7 kg, SpO2 98 %.  Filed Weights   08/08/20 2115  08/10/20 0503 08/11/20 0531  Weight: 99.7 kg 98.1 kg 97.7 kg    Labs & Radiologic Studies    CBC Recent Labs    08/10/20 0242 08/11/20 0251  WBC 5.6 5.8  HGB 11.8* 12.4*  HCT 36.7* 36.2*  MCV 73.4* 71.7*  PLT 176 409   Basic Metabolic Panel Recent Labs    08/10/20 0807 08/11/20 0251  NA 135 135  K 3.7 3.9  CL 103 105  CO2 21* 20*  GLUCOSE 91 104*  BUN 25* 27*  CREATININE 2.27* 2.61*  CALCIUM 9.2 8.9   Liver Function Tests No results for input(s): AST, ALT, ALKPHOS, BILITOT, PROT, ALBUMIN in the last 72 hours. No results for input(s): LIPASE, AMYLASE in the last 72 hours. High Sensitivity Troponin:   Recent Labs  Lab 08/08/20  0600 08/08/20 1006  TROPONINIHS 27* 29*    BNP Invalid input(s): POCBNP D-Dimer No results for input(s): DDIMER in the last 72 hours. Hemoglobin A1C No results for input(s): HGBA1C in the last 72 hours. Fasting Lipid Panel Recent Labs    08/09/20 0242  CHOL 137  HDL 44  LDLCALC 71  TRIG 110  CHOLHDL 3.1   Thyroid Function Tests No results for input(s): TSH, T4TOTAL, T3FREE, THYROIDAB in the last 72 hours.  Invalid input(s): FREET3 _____________  DG Chest Port 1 View  Result Date: 08/08/2020 CLINICAL DATA:  Chest pain.  Shortness of breath. EXAM: PORTABLE CHEST 1 VIEW COMPARISON:  No prior. FINDINGS: Mediastinum and hilar structures normal. Cardiomegaly. No pulmonary venous congestion. No focal infiltrate. No pleural effusion or pneumothorax. Mild thoracic spine scoliosis. IMPRESSION: Cardiomegaly. No pulmonary venous congestion. No acute pulmonary disease. Electronically Signed   By: Marcello Moores  Register   On: 08/08/2020 06:37   ECHOCARDIOGRAM COMPLETE  Result Date: 08/09/2020    ECHOCARDIOGRAM REPORT   Patient Name:   RICCO DERSHEM Date of Exam: 08/09/2020 Medical Rec #:  [id number]      Height:       69.0 in Accession #:    [id number]     Weight:       219.9 lb Date of Birth:  1975/08/30       BSA:          2.151 m Patient Age:     4 years       BP:           178/104 mmHg Patient Gender: M              HR:           72 bpm. Exam Location:  Inpatient Procedure: 2D Echo, Color Doppler and Cardiac Doppler Indications:    R07.9* Chest pain, unspecified  History:        Patient has no prior history of Echocardiogram examinations.                 CAD; Risk Factors:Hypertension, Diabetes and Dyslipidemia.  Sonographer:    Raquel Sarna Senior RDCS Referring Phys: [place]  1. Blood pressure is significantly elevated. Left ventricular ejection fraction, by estimation, is 50 to 55%. The left ventricle has low normal function. The left ventricle has no regional wall motion abnormalities. There is moderate left ventricular hypertrophy. Left ventricular diastolic parameters are consistent with Grade II diastolic dysfunction (pseudonormalization). Elevated left ventricular end-diastolic pressure.  2. Right ventricular systolic function is normal. The right ventricular size is normal.  3. Left atrial size was mildly dilated.  4. A trivial to small pericardial effusion is present. The pericardial effusion is circumferential.  5. The mitral valve is abnormal. Trivial mitral valve regurgitation.  6. The aortic valve is tricuspid. Aortic valve regurgitation is not visualized.  7. The inferior vena cava is normal in size with greater than 50% respiratory variability, suggesting right atrial pressure of 3 mmHg. Comparison(s): No prior Echocardiogram. FINDINGS  Left Ventricle: Blood pressure is significantly elevated. Left ventricular ejection fraction, by estimation, is 50 to 55%. The left ventricle has low normal function. The left ventricle has no regional wall motion abnormalities. The left ventricular internal cavity size was normal in size. There is moderate left ventricular hypertrophy. Left ventricular diastolic parameters are consistent with Grade II diastolic dysfunction (pseudonormalization). Elevated left ventricular end-diastolic  pressure. Right Ventricle: The right ventricular size is normal. No increase in right  ventricular wall thickness. Right ventricular systolic function is normal. Left Atrium: Left atrial size was mildly dilated. Right Atrium: Right atrial size was normal in size. Pericardium: A trivial to small pericardial effusion is present. The pericardial effusion is circumferential. Mitral Valve: The mitral valve is abnormal. There is mild thickening of the mitral valve leaflet(s). Trivial mitral valve regurgitation. Tricuspid Valve: The tricuspid valve is grossly normal. Tricuspid valve regurgitation is trivial. Aortic Valve: The aortic valve is tricuspid. Aortic valve regurgitation is not visualized. Pulmonic Valve: The pulmonic valve was normal in structure. Pulmonic valve regurgitation is not visualized. Aorta: The aortic root and ascending aorta are structurally normal, with no evidence of dilitation. Venous: The inferior vena cava is normal in size with greater than 50% respiratory variability, suggesting right atrial pressure of 3 mmHg. IAS/Shunts: No atrial level shunt detected by color flow Doppler.  LEFT VENTRICLE PLAX 2D LVIDd:         4.40 cm  Diastology LVIDs:         3.20 cm  LV e' medial:    7.40 cm/s LV PW:         1.60 cm  LV E/e' medial:  12.7 LV IVS:        1.40 cm  LV e' lateral:   5.33 cm/s LVOT diam:     2.00 cm  LV E/e' lateral: 17.7 LV SV:         63 LV SV Index:   29 LVOT Area:     3.14 cm  RIGHT VENTRICLE RV S prime:     10.20 cm/s TAPSE (M-mode): 2.2 cm LEFT ATRIUM             Index       RIGHT ATRIUM           Index LA diam:        4.70 cm 2.19 cm/m  RA Area:     16.90 cm LA Vol (A2C):   78.5 ml 36.49 ml/m RA Volume:   38.00 ml  17.67 ml/m LA Vol (A4C):   69.1 ml 32.12 ml/m LA Biplane Vol: 74.5 ml 34.64 ml/m  AORTIC VALVE LVOT Vmax:   107.00 cm/s LVOT Vmean:  69.700 cm/s LVOT VTI:    0.201 m  AORTA Ao Root diam: 2.80 cm MITRAL VALVE MV Area (PHT): 2.76 cm    SHUNTS MV Decel Time: 275 msec     Systemic VTI:  0.20 m MV E velocity: 94.30 cm/s  Systemic Diam: 2.00 cm MV A velocity: 43.70 cm/s MV E/A ratio:  2.16 Lyman Bishop MD Electronically signed by Lyman Bishop MD Signature Date/Time: 08/09/2020/1:05:52 PM    Final    VAS US RENAL ARTERY DUPLEX  Result Date: 08/11/2020 ABDOMINAL VISCERAL Performing Technologist: Vonzell Schlatter RVT  Examination Guidelines: A complete evaluation includes B-mode imaging, spectral Doppler, color Doppler, and power Doppler as needed of all accessible portions of each vessel. Bilateral testing is considered an integral part of a complete examination. Limited examinations for reoccurring indications may be performed as noted.  Duplex Findings: +------------------+--------+--------+-------+ Right Renal ArteryPSV cm/sEDV cm/sComment +------------------+--------+--------+-------+ Origin              108      24           +------------------+--------+--------+-------+ Proximal             97      28           +------------------+--------+--------+-------+ Mid  70      23           +------------------+--------+--------+-------+ Distal               56      18           +------------------+--------+--------+-------+ +-----------------+--------+--------+-------+ Left Renal ArteryPSV cm/sEDV cm/sComment +-----------------+--------+--------+-------+ Origin             104      27           +-----------------+--------+--------+-------+ Proximal            88      23           +-----------------+--------+--------+-------+ Mid                 66      20           +-----------------+--------+--------+-------+ Distal              85      27           +-----------------+--------+--------+-------+ +------------+--------+--------+----+-----------+--------+--------+----+ Right KidneyPSV cm/sEDV cm/sRI  Left KidneyPSV cm/sEDV cm/sRI   +------------+--------+--------+----+-----------+--------+--------+----+ Upper Pole   20      5       0.76Upper Pole 37      10      0.72 +------------+--------+--------+----+-----------+--------+--------+----+ Mid         21      5       0.76Mid        20      6       0.71 +------------+--------+--------+----+-----------+--------+--------+----+ Lower Pole  28      8       0.70Lower Pole 14      4       0.68 +------------+--------+--------+----+-----------+--------+--------+----+ Hilar       28      10      0.62Hilar      20      9       0.55 +------------+--------+--------+----+-----------+--------+--------+----+ +------------------+----+------------------+-----+ Right Kidney          Left Kidney             +------------------+----+------------------+-----+ RAR                   RAR                     +------------------+----+------------------+-----+ RAR (manual)      .51 RAR (manual)      .50   +------------------+----+------------------+-----+ Cortex                Cortex                  +------------------+----+------------------+-----+ Cortex thickness      Corex thickness         +------------------+----+------------------+-----+ Kidney length (cm)9.90Kidney length (cm)11.48 +------------------+----+------------------+-----+  Summary: Renal:  Right: Normal size right kidney. No evidence of right renal artery        stenosis. Left:  Normal size of left kidney. No evidence of left renal artery        stenosis. Mesenteric: Normal Celiac artery and Superior Mesenteric artery findings.  *See table(s) above for measurements and observations.  Diagnosing physician: Monica Martinez MD  Electronically signed by Monica Martinez MD on 08/11/2020 at 3:59:48 PM.    Final    Disposition   Pt is being discharged home today in good condition.  Follow-up Plans & Appointments  Follow-up Information     Argentina Donovan, PA-C Follow up on 09/01/2020.   Specialty: Family Medicine Why: @ 8:50 am the patient will have an  appointment at the [facility]. Pt can utilize the onsite pharmacy and medications range from $4.00-$10.00 Contact information: [place] [address] [phone]         Almyra Deforest, [place] Follow up on 08/16/2020.   Specialties: Cardiology, Radiology Why: at 2:45pm for your follow up appt Contact information: [address] [place] [address] [phone]                Discharge Instructions     Diet - low sodium heart healthy   Complete by: As directed    Increase activity slowly   Complete by: As directed        Discharge Medications   Allergies as of 08/11/2020       Reactions   Penicillins Anaphylaxis   ALL   Tomato Swelling        Medication List     STOP taking these medications    cloNIDine 0.1 MG tablet Commonly known as: Catapres   lisinopril 10 MG tablet Commonly known as: ZESTRIL   metoprolol tartrate 25 MG tablet Commonly known as: LOPRESSOR   nicotine 21 mg/24hr patch Commonly known as: NICODERM CQ - dosed in mg/24 hours       TAKE these medications    albuterol 108 (90 Base) MCG/ACT inhaler Commonly known as: VENTOLIN HFA Inhale 2 puffs into the lungs every 6 (six) hours as needed for wheezing or shortness of breath.   amLODipine 10 MG tablet Commonly known as: NORVASC Take 1 tablet (10 mg total) by mouth daily. For blood pressure   aspirin EC 81 MG tablet Take 1 tablet (81 mg total) by mouth daily.   atorvastatin 40 MG tablet Commonly known as: LIPITOR Take 1 tablet (40 mg total) by mouth daily.   busPIRone 7.5 MG tablet Commonly known as: BUSPAR Take 1 tablet (7.5 mg total) by mouth 3 (three) times daily.   carvedilol 25 MG tablet Commonly known as: COREG Take 1 tablet (25 mg total) by mouth 2 (two) times daily with a meal.   Fluticasone-Salmeterol 250-50 MCG/DOSE Aepb Commonly known as: Advair Diskus Inhale 1 puff into the lungs 2 (two) times daily.   hydrALAZINE 100 MG  tablet Commonly known as: APRESOLINE Take 1 tablet (100 mg total) by mouth every 8 (eight) hours.   nitroGLYCERIN 0.6 MG SL tablet Commonly known as: NITROSTAT Place 0.6 mg under the tongue every 5 (five) minutes as needed for chest pain.          Outstanding Labs/Studies   BMET at follow up appt.   Duration of Discharge Encounter   Greater than 30 minutes including physician time.  Signed, Reino Bellis, NP 08/11/2020, 4:18 PM  Agree with note by Reino Bellis NP-C  OK for DC home . Renal duplex neg  for RAS. 2D with nl LV systolic FXN. LVH and DD. BP meds titrated IP and will continue OP. BP log, ROV with Pharm D to review and continue titration. Pt is aware of dietary restrictions.    Lorretta Harp, M.D., [place], [facility] [place] [address] [place], [place]  [address]  [phone] 08/12/2020 8:26 AM

## 2020-08-11 NOTE — Progress Notes (Addendum)
Progress Note  Patient Name: George Day Date of Encounter: 08/11/2020  [facility] Cardiologist: Quay Burow, MD   Subjective   No chest pain. C/o nasal congestion.   Inpatient Medications    Scheduled Meds:  amLODipine  10 mg Oral Daily   aspirin EC  81 mg Oral Daily   atorvastatin  80 mg Oral Daily   busPIRone  7.5 mg Oral TID   carvedilol  12.5 mg Oral BID WC   hydrALAZINE  75 mg Oral Q8H   nicotine  21 mg Transdermal Daily   Continuous Infusions:  sodium chloride 50 mL/hr at 08/11/20 0548   PRN Meds: acetaminophen, albuterol, nitroGLYCERIN, ondansetron (ZOFRAN) IV   Vital Signs    Vitals:   08/10/20 2149 08/11/20 0103 08/11/20 0531 08/11/20 0538  BP: (!) 169/99 (!) 171/97  (!) 179/110  Pulse:  95    Resp:  17  20  Temp:  98.6 F (37 C)  97.9 F (36.6 C)  TempSrc:  Oral  Oral  SpO2:  98%  98%  Weight:   97.7 kg   Height:        Intake/Output Summary (Last 24 hours) at 08/11/2020 0817 Last data filed at 08/11/2020 0400 Gross per 24 hour  Intake 2022.95 ml  Output 1150 ml  Net 872.95 ml   Last 3 Weights 08/11/2020 08/10/2020 08/08/2020  Weight (lbs) 215 lb 6.2 oz 216 lb 4.8 oz 219 lb 14.4 oz  Weight (kg) 97.7 kg 98.113 kg 99.746 kg      Telemetry    SR - Personally Reviewed  ECG    No new tracing.   Physical Exam  Pleasant male, sitting up in the chair GEN: No acute distress.   Neck: No JVD Cardiac: RRR, no murmurs, rubs, or gallops.  Respiratory: Clear to auscultation bilaterally. GI: Soft, nontender, non-distended  MS: No edema; No deformity. Neuro:  Nonfocal  Psych: Normal affect   Labs    High Sensitivity Troponin:   Recent Labs  Lab 08/08/20 0600 08/08/20 1006  TROPONINIHS 27* 29*      Chemistry Recent Labs  Lab 08/09/20 0242 08/10/20 0807 08/11/20 0251  NA 137 135 135  K 3.7 3.7 3.9  CL 105 103 105  CO2 24 21* 20*  GLUCOSE 112* 91 104*  BUN 33* 25* 27*  CREATININE 2.67* 2.27* 2.61*  CALCIUM 8.8* 9.2  8.9  GFRNONAA 29* 36* 30*  ANIONGAP 8 11 10      Hematology Recent Labs  Lab 08/09/20 0242 08/10/20 0242 08/11/20 0251  WBC 6.7 5.6 5.8  RBC 5.13 5.00 5.05  HGB 12.2* 11.8* 12.4*  HCT 37.8* 36.7* 36.2*  MCV 73.7* 73.4* 71.7*  MCH 23.8* 23.6* 24.6*  MCHC 32.3 32.2 34.3  RDW 15.9* 16.1* 15.9*  PLT 161 176 176    BNP Recent Labs  Lab 08/08/20 0600  BNP 134.8*     DDimer No results for input(s): DDIMER in the last 168 hours.   Radiology    ECHOCARDIOGRAM COMPLETE  Result Date: 08/09/2020    ECHOCARDIOGRAM REPORT   Patient Name:   DARRELL HAUK Date of Exam: 08/09/2020 Medical Rec #:  [id number]      Height:       69.0 in Accession #:    [id number]     Weight:       219.9 lb Date of Birth:  1976-02-23       BSA:          2.151 m Patient  Age:    74 years       BP:           178/104 mmHg Patient Gender: M              HR:           72 bpm. Exam Location:  Inpatient Procedure: 2D Echo, Color Doppler and Cardiac Doppler Indications:    R07.9* Chest pain, unspecified  History:        Patient has no prior history of Echocardiogram examinations.                 CAD; Risk Factors:Hypertension, Diabetes and Dyslipidemia.  Sonographer:    Raquel Sarna Senior RDCS Referring Phys: [place]  1. Blood pressure is significantly elevated. Left ventricular ejection fraction, by estimation, is 50 to 55%. The left ventricle has low normal function. The left ventricle has no regional wall motion abnormalities. There is moderate left ventricular hypertrophy. Left ventricular diastolic parameters are consistent with Grade II diastolic dysfunction (pseudonormalization). Elevated left ventricular end-diastolic pressure.  2. Right ventricular systolic function is normal. The right ventricular size is normal.  3. Left atrial size was mildly dilated.  4. A trivial to small pericardial effusion is present. The pericardial effusion is circumferential.  5. The mitral valve is abnormal. Trivial  mitral valve regurgitation.  6. The aortic valve is tricuspid. Aortic valve regurgitation is not visualized.  7. The inferior vena cava is normal in size with greater than 50% respiratory variability, suggesting right atrial pressure of 3 mmHg. Comparison(s): No prior Echocardiogram. FINDINGS  Left Ventricle: Blood pressure is significantly elevated. Left ventricular ejection fraction, by estimation, is 50 to 55%. The left ventricle has low normal function. The left ventricle has no regional wall motion abnormalities. The left ventricular internal cavity size was normal in size. There is moderate left ventricular hypertrophy. Left ventricular diastolic parameters are consistent with Grade II diastolic dysfunction (pseudonormalization). Elevated left ventricular end-diastolic pressure. Right Ventricle: The right ventricular size is normal. No increase in right ventricular wall thickness. Right ventricular systolic function is normal. Left Atrium: Left atrial size was mildly dilated. Right Atrium: Right atrial size was normal in size. Pericardium: A trivial to small pericardial effusion is present. The pericardial effusion is circumferential. Mitral Valve: The mitral valve is abnormal. There is mild thickening of the mitral valve leaflet(s). Trivial mitral valve regurgitation. Tricuspid Valve: The tricuspid valve is grossly normal. Tricuspid valve regurgitation is trivial. Aortic Valve: The aortic valve is tricuspid. Aortic valve regurgitation is not visualized. Pulmonic Valve: The pulmonic valve was normal in structure. Pulmonic valve regurgitation is not visualized. Aorta: The aortic root and ascending aorta are structurally normal, with no evidence of dilitation. Venous: The inferior vena cava is normal in size with greater than 50% respiratory variability, suggesting right atrial pressure of 3 mmHg. IAS/Shunts: No atrial level shunt detected by color flow Doppler.  LEFT VENTRICLE PLAX 2D LVIDd:         4.40 cm   Diastology LVIDs:         3.20 cm  LV e' medial:    7.40 cm/s LV PW:         1.60 cm  LV E/e' medial:  12.7 LV IVS:        1.40 cm  LV e' lateral:   5.33 cm/s LVOT diam:     2.00 cm  LV E/e' lateral: 17.7 LV SV:  63 LV SV Index:   29 LVOT Area:     3.14 cm  RIGHT VENTRICLE RV S prime:     10.20 cm/s TAPSE (M-mode): 2.2 cm LEFT ATRIUM             Index       RIGHT ATRIUM           Index LA diam:        4.70 cm 2.19 cm/m  RA Area:     16.90 cm LA Vol (A2C):   78.5 ml 36.49 ml/m RA Volume:   38.00 ml  17.67 ml/m LA Vol (A4C):   69.1 ml 32.12 ml/m LA Biplane Vol: 74.5 ml 34.64 ml/m  AORTIC VALVE LVOT Vmax:   107.00 cm/s LVOT Vmean:  69.700 cm/s LVOT VTI:    0.201 m  AORTA Ao Root diam: 2.80 cm MITRAL VALVE MV Area (PHT): 2.76 cm    SHUNTS MV Decel Time: 275 msec    Systemic VTI:  0.20 m MV E velocity: 94.30 cm/s  Systemic Diam: 2.00 cm MV A velocity: 43.70 cm/s MV E/A ratio:  2.16 Lyman Bishop MD Electronically signed by Lyman Bishop MD Signature Date/Time: 08/09/2020/1:05:52 PM    Final     Cardiac Studies   Echo: 08/09/20  IMPRESSIONS     1. Blood pressure is significantly elevated. Left ventricular ejection  fraction, by estimation, is 50 to 55%. The left ventricle has low normal  function. The left ventricle has no regional wall motion abnormalities.  There is moderate left ventricular  hypertrophy. Left ventricular diastolic parameters are consistent with  Grade II diastolic dysfunction (pseudonormalization). Elevated left  ventricular end-diastolic pressure.   2. Right ventricular systolic function is normal. The right ventricular  size is normal.   3. Left atrial size was mildly dilated.   4. A trivial to small pericardial effusion is present. The pericardial  effusion is circumferential.   5. The mitral valve is abnormal. Trivial mitral valve regurgitation.   6. The aortic valve is tricuspid. Aortic valve regurgitation is not  visualized.   7. The inferior vena cava is  normal in size with greater than 50%  respiratory variability, suggesting right atrial pressure of 3 mmHg.   Comparison(s): No prior Echocardiogram.   Patient Profile     44 y.o. male with a hx of CAD s/p MI in 2010 with LHC but no PCI (per pt report in [place], [place]), HLD, HTN, DM2, tobacco use, anxiety, CVA, asthma, and obesity who presented to the ED 08/08/20 with chest pain.  Assessment & Plan    1. Chest pain with known CAD: patient presented with chest pain radiating to his left side with associated SOB and relief with SL nitro. Per patient report he had a LHC in 2010, though no stents were placed at that time (presumed non-obstructive disease). EKG this admission with anterior Q waves though no STE/D. HsTrop 27>29; low flat trend not c/w ACS. Felt to be demand ischemia in the setting of markedly elevated BP and renal dysfunction.  -- echo this admission with EF 50-55%, no RWMA, moderate LVH, G2DD.  RV normal.  Trivial to small pericardial effusion.  Valves stable.  plan outpatient NST to r/o ischemia -- treated with briefly with IV heparin -- on Coreg 12.5mg  BID, hydralazine 75mg  TID, and norvasc 10mg  daily -- Continue aspirin and statin   2. HTN: BP markedly elevated this admission, and home ACEi held in the setting of renal dysfunction.  -- will  further increase coreg to 25mg  BID, and hydralazine 100mg  TID as blood pressures remain uncontrolled -- Will stop clonidine at this time as he was not on this medication for the past several months and not ideal in patient with questionable compliance issues.    3. HLD: LDL 71, increased atorvastatin to 80mg  daily to reach goal LDL <70   4. DM type 2: A1C 5.6 12/2019. No currently on medications -- Continue ISS this admission -- Needs to establish care with PCP in [place] - CM to assist   5. Tobacco abuse: still smoking 1/2 ppd -- Continue to encourage smoking cessation   6. CKD stage 3: Cr continues to be 2.6 today despite holding  home lisinopril and receiving IVFs. Cr was 1.8 12/2019 and 2.1 02/2020. Question if this is his new baseline? -- pending renal US today -- Will need to establish care with nephrology outpatient    For questions or updates, please contact [place] Please consult [email or web address] for contact info under        Signed, Reino Bellis, NP  08/11/2020, 8:17 AM  '  Agree with note by Reino Bellis NP-C  Blood pressure continues to be elevated but somewhat improved.  Medications titrated including hydralazine and carvedilol.  Scheduled for renal duplex today looking for renal artery stenosis.  Plan discharge home this afternoon.  We talked about salt avoidance, keeping a blood pressure log and following up with a Pharm.D. in 2 weeks to review blood pressures and make additional titrations as necessary.  Lorretta Harp, M.D., [place], [facility], [place] [address]. [place], [place]  [address]  [phone] 08/11/2020 9:39 AM

## 2020-08-15 NOTE — Telephone Encounter (Signed)
Patient contacted regarding discharge from [facility] on 12/24.  Patient understands to follow up with provider Almyra Deforest, PA on 12/28 at 2:45 PM at [facility] office. Patient understands discharge instructions? Yes Patient understands medications and regiment? Yes Patient understands to bring all medications to this visit? Yes

## 2020-08-16 ENCOUNTER — Telehealth: Payer: Self-pay

## 2020-08-16 ENCOUNTER — Encounter: Payer: Self-pay | Admitting: Physician Assistant

## 2020-08-16 ENCOUNTER — Other Ambulatory Visit: Payer: Self-pay

## 2020-08-16 ENCOUNTER — Ambulatory Visit (INDEPENDENT_AMBULATORY_CARE_PROVIDER_SITE_OTHER): Payer: Medicaid Other | Admitting: Physician Assistant

## 2020-08-16 VITALS — BP 150/70 | HR 88 | Ht 69.0 in | Wt 219.0 lb

## 2020-08-16 DIAGNOSIS — N184 Chronic kidney disease, stage 4 (severe): Secondary | ICD-10-CM | POA: Diagnosis not present

## 2020-08-16 DIAGNOSIS — I2510 Atherosclerotic heart disease of native coronary artery without angina pectoris: Secondary | ICD-10-CM

## 2020-08-16 DIAGNOSIS — I10 Essential (primary) hypertension: Secondary | ICD-10-CM | POA: Diagnosis not present

## 2020-08-16 DIAGNOSIS — R079 Chest pain, unspecified: Secondary | ICD-10-CM

## 2020-08-16 DIAGNOSIS — E785 Hyperlipidemia, unspecified: Secondary | ICD-10-CM

## 2020-08-16 DIAGNOSIS — E119 Type 2 diabetes mellitus without complications: Secondary | ICD-10-CM

## 2020-08-16 DIAGNOSIS — Z8673 Personal history of transient ischemic attack (TIA), and cerebral infarction without residual deficits: Secondary | ICD-10-CM

## 2020-08-16 MED ORDER — AMLODIPINE BESYLATE 10 MG PO TABS: 10.0000 mg | ORAL_TABLET | Freq: Every day | ORAL | 3 refills | Status: DC

## 2020-08-16 MED FILL — AMLODIPINE BESYLATE 10 MG T: 10 | 30 days supply | Qty: 30 | Fill #0

## 2020-08-16 NOTE — Progress Notes (Signed)
Cardiology Office Note:    Date:  08/17/2020   ID:  George Day, DOB 1976/07/30, MRN [id number]  PCP:  Ladell Pier, MD  [facility] Cardiologist:  Quay Burow, MD  [employer] Electrophysiologist:  None   Referring MD: Ladell Pier, MD   Chief Complaint  Patient presents with  . Follow-up    Seen for Dr. Gwenlyn Found    History of Present Illness:    George Day is a 44 y.o. male with a hx of CAD, HTN, HLD, DM II, CKD, history of CVA and tobacco abuse. He had a history of MI in 2010, cardiac catheterization was performed however no PCI per patient report.  The procedure was done in [facility].  Patient presented to the ED on 08/08/2020 with chest pain.  Serial troponin was flat.  EKG showed sinus rhythm with anterior Q waves and early repolarization.  Creatinine on arrival was 2.62, baseline was 1.83 in May 2021.  Echocardiogram performed on 08/09/2020 showed EF 50 to 55%, no regional wall motion abnormality, grade 2 DD, trivial to small pericardial effusion that is circumferential.  Blood pressure was markedly elevated during this admission, borderline elevated troponin was felt to be related to high blood pressure.  Renal artery duplex obtained on 08/11/2020 was normal.  Carvedilol was increased to 25 mg twice daily.  Hydralazine increased to 100 mg 3 times daily.  Clonidine was stopped due to questionable compliance.  Lipitor increased to 80 mg daily.  Patient presents today for follow-up.  He continued to have intermittent chest discomfort.  I recommended proceeding with outpatient Myoview.  I have also referred the patient to nephrology service.  As far as his blood pressure medication, he mistakenly thought amlodipine was discontinued, therefore he has not been taking it.  I restart amlodipine 10 mg daily.  His blood pressure remains elevated even on manual recheck by myself, it was 158/96.  Otherwise he has no lower extremity edema, orthopnea or PND.  I  plan to see the patient in 6 weeks at which time I will obtain fasting lipid panel and LFT.  Otherwise she can follow-up with Dr. Gwenlyn Found in 3 months.   Past Medical History:  Diagnosis Date  . Anemia 1998  . Anginal pain ([place])   . Anxiety   . Asthma   . Coronary artery disease   . Diabetes mellitus without complication ([place]) 1093  . Hypertension 2019  . MI (myocardial infarction) ([place]) 2010  . Stroke [facility]) 2010    Past Surgical History:  Procedure Laterality Date  . CARDIAC CATHETERIZATION  2010    Current Medications: Current Meds  Medication Sig  . amLODipine (NORVASC) 10 MG tablet Take 1 tablet (10 mg total) by mouth daily.     Allergies:   Penicillins and Tomato   Social History   Socioeconomic History  . Marital status: Significant Other    Spouse name: Not on file  . Number of children: 3  . Years of education: Not on file  . Highest education level: Not on file  Occupational History  . Not on file  Tobacco Use  . Smoking status: Current Every Day Smoker    Packs/day: 0.50    Years: 31.00    Pack years: 15.50    Types: Cigars  . Smokeless tobacco: Never Used  Vaping Use  . Vaping Use: Never used  Substance and Sexual Activity  . Alcohol use: Yes    Comment: occasioanlly   . Drug use: Never  .  Sexual activity: Yes    Birth control/protection: None  Other Topics Concern  . Not on file  Social History Narrative  . Not on file   Social Determinants of Health   Financial Resource Strain: Not on file  Food Insecurity: Not on file  Transportation Needs: Not on file  Physical Activity: Not on file  Stress: Not on file  Social Connections: Not on file     Family History: The patient's family history includes Diabetes in his brother, mother, and sister; Hearing loss in his sister; Hyperlipidemia in his brother, mother, and sister.  ROS:   Please see the history of present illness.     All other systems reviewed and are negative.  EKGs/Labs/Other  Studies Reviewed:    The following studies were reviewed today:  Echo 08/09/2020 1. Blood pressure is significantly elevated. Left ventricular ejection  fraction, by estimation, is 50 to 55%. The left ventricle has low normal  function. The left ventricle has no regional wall motion abnormalities.  There is moderate left ventricular  hypertrophy. Left ventricular diastolic parameters are consistent with  Grade II diastolic dysfunction (pseudonormalization). Elevated left  ventricular end-diastolic pressure.  2. Right ventricular systolic function is normal. The right ventricular  size is normal.  3. Left atrial size was mildly dilated.  4. A trivial to small pericardial effusion is present. The pericardial  effusion is circumferential.  5. The mitral valve is abnormal. Trivial mitral valve regurgitation.  6. The aortic valve is tricuspid. Aortic valve regurgitation is not  visualized.  7. The inferior vena cava is normal in size with greater than 50%  respiratory variability, suggesting right atrial pressure of 3 mmHg.   Comparison(s): No prior Echocardiogram.   EKG:  EKG is not ordered today.    Recent Labs: 01/05/2020: ALT 24 08/08/2020: B Natriuretic Peptide 134.8 08/11/2020: BUN 27; Creatinine, Ser 2.61; Hemoglobin 12.4; Platelets 176; Potassium 3.9; Sodium 135  Recent Lipid Panel    Component Value Date/Time   CHOL 137 08/09/2020 0242   CHOL 181 01/05/2020 1515   TRIG 110 08/09/2020 0242   HDL 44 08/09/2020 0242   HDL 41 01/05/2020 1515   CHOLHDL 3.1 08/09/2020 0242   VLDL 22 08/09/2020 0242   LDLCALC 71 08/09/2020 0242   LDLCALC 111 (H) 01/05/2020 1515     Risk Assessment/Calculations:       Physical Exam:    VS:  BP (!) 150/70   Pulse 88   Ht 5\' 9"  (1.753 m)   Wt 219 lb (99.3 kg)   SpO2 98%   BMI 32.34 kg/m     Wt Readings from Last 3 Encounters:  08/16/20 219 lb (99.3 kg)  08/11/20 215 lb 6.2 oz (97.7 kg)  02/25/20 223 lb 9.6 oz (101.4 kg)      GEN:  Well nourished, well developed in no acute distress HEENT: Normal NECK: No JVD; No carotid bruits LYMPHATICS: No lymphadenopathy CARDIAC: RRR, no murmurs, rubs, gallops RESPIRATORY:  Clear to auscultation without rales, wheezing or rhonchi  ABDOMEN: Soft, non-tender, non-distended MUSCULOSKELETAL:  No edema; No deformity  SKIN: Warm and dry NEUROLOGIC:  Alert and oriented x 3 PSYCHIATRIC:  Normal affect   ASSESSMENT:    1. Chest pain of uncertain etiology   2. CKD (chronic kidney disease) stage 4, GFR 15-29 ml/min (HCC)   3. Coronary artery disease involving native coronary artery of native heart without angina pectoris   4. Essential hypertension   5. Hyperlipidemia LDL goal <70  6. Controlled type 2 diabetes mellitus without complication, without long-term current use of insulin ([place])   7. H/O: CVA (cerebrovascular accident)    PLAN:    In order of problems listed above:  1. Chest discomfort: Not ideal candidate for cardiac catheterization due to renal insufficiency.  We will proceed with Myoview as suggested during hospitalization  2. CKD stage IV: Referred to nephrology service  3. CAD: Patient reported history of CAD prior to moving to [place] from [place].  Proceed with Myoview  4. Hypertension: Blood pressure remains elevated, however patient misunderstood the instruction and thought the amlodipine was discontinued.  I asked him to restart amlodipine at 10 mg daily  5. Hyperlipidemia: Not on any cholesterol medication.  We will need to consider on that the next follow-up  6. DM2: Managed by primary care provider  7. History of CVA: No recurrence.   Shared Decision Making/Informed Consent The risks [chest pain, shortness of breath, cardiac arrhythmias, dizziness, blood pressure fluctuations, myocardial infarction, stroke/transient ischemic attack, nausea, vomiting, allergic reaction, radiation exposure, metallic taste sensation and  life-threatening complications (estimated to be 1 in 10,000)], benefits (risk stratification, diagnosing coronary artery disease, treatment guidance) and alternatives of a nuclear stress test were discussed in detail with Mr. Gladman and he agrees to proceed.       Medication Adjustments/Labs and Tests Ordered: Current medicines are reviewed at length with the patient today.  Concerns regarding medicines are outlined above.  Orders Placed This Encounter  Procedures  . Ambulatory referral to Nephrology  . Cardiac Stress Test: Informed Consent Details: Physician/Practitioner Attestation; Transcribe to consent form and obtain patient signature  . MYOCARDIAL PERFUSION IMAGING   Meds ordered this encounter  Medications  . amLODipine (NORVASC) 10 MG tablet    Sig: Take 1 tablet (10 mg total) by mouth daily.    Dispense:  90 tablet    Refill:  3    Patient Instructions  Medication Instructions:   RESTART Amlodipine 10 mg daily  *If you need a refill on your cardiac medications before your next appointment, please call your pharmacy*  Lab Work: NONE ordered at this time of appointment   If you have labs (blood work) drawn today and your tests are completely normal, you will receive your results only by: Marland Kitchen MyChart Message (if you have MyChart) OR . A paper copy in the mail If you have any lab test that is abnormal or we need to change your treatment, we will call you to review the results.  Testing/Procedures: Your physician has requested that you have a lexiscan myoview. For further information please visit [email or web address]. Please follow instruction sheet, as given.   Please schedule for 2-3 weeks    Follow-Up: At [facility], you and your health needs are our priority.  As part of our continuing mission to provide you with exceptional heart care, we have created designated Provider Care Teams.  These Care Teams include your primary Cardiologist (physician) and Advanced  Practice Providers (APPs -  Physician Assistants and Nurse Practitioners) who all work together to provide you with the care you need, when you need it.  Your next appointment:   6 week(s) 3 month(s)  The format for your next appointment:   In Person In Person  Provider:   Almyra Deforest, PA-C Quay Burow, MD  Other Instructions      Signed, Almyra Deforest, [place]  08/17/2020 11:24 PM    [place]

## 2020-08-16 NOTE — Patient Instructions (Signed)
Medication Instructions:   RESTART Amlodipine 10 mg daily  *If you need a refill on your cardiac medications before your next appointment, please call your pharmacy*  Lab Work: NONE ordered at this time of appointment   If you have labs (blood work) drawn today and your tests are completely normal, you will receive your results only by: Marland Kitchen MyChart Message (if you have MyChart) OR . A paper copy in the mail If you have any lab test that is abnormal or we need to change your treatment, we will call you to review the results.  Testing/Procedures: Your physician has requested that you have a lexiscan myoview. For further information please visit [email or web address]. Please follow instruction sheet, as given.   Please schedule for 2-3 weeks    Follow-Up: At [facility], you and your health needs are our priority.  As part of our continuing mission to provide you with exceptional heart care, we have created designated Provider Care Teams.  These Care Teams include your primary Cardiologist (physician) and Advanced Practice Providers (APPs -  Physician Assistants and Nurse Practitioners) who all work together to provide you with the care you need, when you need it.  Your next appointment:   6 week(s) 3 month(s)  The format for your next appointment:   In Person In Person  Provider:   Almyra Deforest, PA-C Quay Burow, MD  Other Instructions

## 2020-08-16 NOTE — Telephone Encounter (Signed)
Transition Care Management Follow-up Telephone Call  Date of discharge and from where: 08/11/2020, [facility]   How have you been since you were released from the hospital? He said he is feeling fine.   Any questions or concerns? No  Items Reviewed:  Did the pt receive and understand the discharge instructions provided? Yes   Medications obtained and verified? Yes  - he said he has all medications, including the new ones and did not have any questions about the med regime  Other? No   Any new allergies since your discharge? No   Do you have support at home? Yes    He said that he still has out of state medicaid. Explained to him that he needs to apply for [place] medicaid, it does not transfer state to state and he can apply for [place] medicaid on-line   [place] and Equipment/Supplies: Were home health services ordered? no If so, what is the name of the agency? n/a Has the agency set up a time to come to the patient's home? n/a Were any new equipment or medical supplies ordered?  No What is the name of the medical supply agency? n/a Were you able to get the supplies/equipment? n/a Do you have any questions related to the use of the equipment or supplies? No, n/a.  Functional Questionnaire: (I = Independent and D = Dependent) ADLs: independent - he thinks he may benefit from using a cane as he has experienced some recent falls.   Follow up appointments reviewed:   PCP Hospital f/u appt confirmed? Yes  - Dr Wynetta Emery 08/29/2020.    [facility] f[facility]u appt confirmed? Yes cardiology today 08/16/2020  Are transportation arrangements needed? No   If their condition worsens, is the pt aware to call PCP or go to the Emergency Dept.?    Yes  Was the patient provided with contact information for the PCP's office or ED?  He has the phone number for [facility]  Was to pt encouraged to call back with questions or concerns? Yes

## 2020-08-17 ENCOUNTER — Encounter: Payer: Self-pay | Admitting: Physician Assistant

## 2020-08-29 ENCOUNTER — Other Ambulatory Visit: Payer: Self-pay | Admitting: Internal Medicine

## 2020-08-29 ENCOUNTER — Other Ambulatory Visit: Payer: Self-pay

## 2020-08-29 ENCOUNTER — Encounter: Payer: Self-pay | Admitting: Internal Medicine

## 2020-08-29 ENCOUNTER — Ambulatory Visit: Payer: Self-pay | Attending: Internal Medicine | Admitting: Internal Medicine

## 2020-08-29 DIAGNOSIS — I10 Essential (primary) hypertension: Secondary | ICD-10-CM

## 2020-08-29 DIAGNOSIS — Z2821 Immunization not carried out because of patient refusal: Secondary | ICD-10-CM | POA: Insufficient documentation

## 2020-08-29 DIAGNOSIS — J4540 Moderate persistent asthma, uncomplicated: Secondary | ICD-10-CM

## 2020-08-29 DIAGNOSIS — Z87891 Personal history of nicotine dependence: Secondary | ICD-10-CM | POA: Insufficient documentation

## 2020-08-29 DIAGNOSIS — Z09 Encounter for follow-up examination after completed treatment for conditions other than malignant neoplasm: Secondary | ICD-10-CM

## 2020-08-29 DIAGNOSIS — E1159 Type 2 diabetes mellitus with other circulatory complications: Secondary | ICD-10-CM

## 2020-08-29 DIAGNOSIS — E785 Hyperlipidemia, unspecified: Secondary | ICD-10-CM | POA: Insufficient documentation

## 2020-08-29 DIAGNOSIS — N1832 Chronic kidney disease, stage 3b: Secondary | ICD-10-CM | POA: Insufficient documentation

## 2020-08-29 DIAGNOSIS — Z289 Immunization not carried out for unspecified reason: Secondary | ICD-10-CM

## 2020-08-29 DIAGNOSIS — E1169 Type 2 diabetes mellitus with other specified complication: Secondary | ICD-10-CM | POA: Insufficient documentation

## 2020-08-29 DIAGNOSIS — D509 Iron deficiency anemia, unspecified: Secondary | ICD-10-CM | POA: Insufficient documentation

## 2020-08-29 DIAGNOSIS — I2510 Atherosclerotic heart disease of native coronary artery without angina pectoris: Secondary | ICD-10-CM

## 2020-08-29 MED ORDER — ASPIRIN EC 81 MG PO TBEC: 81.0000 mg | DELAYED_RELEASE_TABLET | Freq: Every day | ORAL | 1 refills | Status: DC

## 2020-08-29 MED ORDER — FLUTICASONE-SALMETEROL 250-50 MCG/DOSE IN AEPB: 1.0000 | INHALATION_SPRAY | Freq: Two times a day (BID) | RESPIRATORY_TRACT | 6 refills | Status: DC

## 2020-08-29 MED ORDER — ATORVASTATIN CALCIUM 40 MG PO TABS
40.0000 mg | ORAL_TABLET | Freq: Every day | ORAL | 3 refills | Status: DC
Start: 2020-08-29 — End: 2020-08-29

## 2020-08-29 MED FILL — ATORVASTATIN CALCIUM 40 MG: 40 | 30 days supply | Qty: 30 | Fill #0

## 2020-08-29 MED FILL — ?FLUTICASONE-SALMETEROL 250: 250-50 | 30 days supply | Qty: 60 | Fill #0

## 2020-08-29 NOTE — Patient Instructions (Signed)
Diabetes Mellitus and Nutrition, Adult When you have diabetes, or diabetes mellitus, it is very important to have healthy eating habits because your blood sugar (glucose) levels are greatly affected by what you eat and drink. Eating healthy foods in the right amounts, at about the same times every day, can help you:  Control your blood glucose.  Lower your risk of heart disease.  Improve your blood pressure.  Reach or maintain a healthy weight. What can affect my meal plan? Every person with diabetes is different, and each person has different needs for a meal plan. Your health care provider may recommend that you work with a dietitian to make a meal plan that is best for you. Your meal plan may vary depending on factors such as:  The calories you need.  The medicines you take.  Your weight.  Your blood glucose, blood pressure, and cholesterol levels.  Your activity level.  Other health conditions you have, such as heart or kidney disease. How do carbohydrates affect me? Carbohydrates, also called carbs, affect your blood glucose level more than any other type of food. Eating carbs naturally raises the amount of glucose in your blood. Carb counting is a method for keeping track of how many carbs you eat. Counting carbs is important to keep your blood glucose at a healthy level, especially if you use insulin or take certain oral diabetes medicines. It is important to know how many carbs you can safely have in each meal. This is different for every person. Your dietitian can help you calculate how many carbs you should have at each meal and for each snack. How does alcohol affect me? Alcohol can cause a sudden decrease in blood glucose (hypoglycemia), especially if you use insulin or take certain oral diabetes medicines. Hypoglycemia can be a life-threatening condition. Symptoms of hypoglycemia, such as sleepiness, dizziness, and confusion, are similar to symptoms of having too much  alcohol.  Do not drink alcohol if: ? Your health care provider tells you not to drink. ? You are pregnant, may be pregnant, or are planning to become pregnant.  If you drink alcohol: ? Do not drink on an empty stomach. ? Limit how much you use to:  0-1 drink a day for women.  0-2 drinks a day for men. ? Be aware of how much alcohol is in your drink. In the [place] one drink equals one 12 oz bottle of beer (355 mL), one 5 oz glass of wine (148 mL), or one 1 oz glass of hard liquor (44 mL). ? Keep yourself hydrated with water, diet soda, or unsweetened iced tea.  Keep in mind that regular soda, juice, and other mixers may contain a lot of sugar and must be counted as carbs. What are tips for following this plan? Reading food labels  Start by checking the serving size on the "Nutrition Facts" label of packaged foods and drinks. The amount of calories, carbs, fats, and other nutrients listed on the label is based on one serving of the item. Many items contain more than one serving per package.  Check the total grams (g) of carbs in one serving. You can calculate the number of servings of carbs in one serving by dividing the total carbs by 15. For example, if a food has 30 g of total carbs per serving, it would be equal to 2 servings of carbs.  Check the number of grams (g) of saturated fats and trans fats in one serving. Choose foods that have   a low amount or none of these fats.  Check the number of milligrams (mg) of salt (sodium) in one serving. Most people should limit total sodium intake to less than 2,300 mg per day.  Always check the nutrition information of foods labeled as "low-fat" or "nonfat." These foods may be higher in added sugar or refined carbs and should be avoided.  Talk to your dietitian to identify your daily goals for nutrients listed on the label. Shopping  Avoid buying canned, pre-made, or processed foods. These foods tend to be high in fat, sodium, and added  sugar.  Shop around the outside edge of the grocery store. This is where you will most often find fresh fruits and vegetables, bulk grains, fresh meats, and fresh dairy. Cooking  Use low-heat cooking methods, such as baking, instead of high-heat cooking methods like deep frying.  Cook using healthy oils, such as olive, canola, or sunflower oil.  Avoid cooking with butter, cream, or high-fat meats. Meal planning  Eat meals and snacks regularly, preferably at the same times every day. Avoid going long periods of time without eating.  Eat foods that are high in fiber, such as fresh fruits, vegetables, beans, and whole grains. Talk with your dietitian about how many servings of carbs you can eat at each meal.  Eat 4-6 oz (112-168 g) of lean protein each day, such as lean meat, chicken, fish, eggs, or tofu. One ounce (oz) of lean protein is equal to: ? 1 oz (28 g) of meat, chicken, or fish. ? 1 egg. ?  cup (62 g) of tofu.  Eat some foods each day that contain healthy fats, such as avocado, nuts, seeds, and fish.   What foods should I eat? Fruits Berries. Apples. Oranges. Peaches. Apricots. Plums. Grapes. Mango. Papaya. Pomegranate. Kiwi. Cherries. Vegetables Lettuce. Spinach. Leafy greens, including kale, chard, collard greens, and mustard greens. Beets. Cauliflower. Cabbage. Broccoli. Carrots. Green beans. Tomatoes. Peppers. Onions. Cucumbers. Brussels sprouts. Grains Whole grains, such as whole-wheat or whole-grain bread, crackers, tortillas, cereal, and pasta. Unsweetened oatmeal. Quinoa. Brown or wild rice. Meats and other proteins Seafood. Poultry without skin. Lean cuts of poultry and beef. Tofu. Nuts. Seeds. Dairy Low-fat or fat-free dairy products such as milk, yogurt, and cheese. The items listed above may not be a complete list of foods and beverages you can eat. Contact a dietitian for more information. What foods should I avoid? Fruits Fruits canned with  syrup. Vegetables Canned vegetables. Frozen vegetables with butter or cream sauce. Grains Refined white flour and flour products such as bread, pasta, snack foods, and cereals. Avoid all processed foods. Meats and other proteins Fatty cuts of meat. Poultry with skin. Breaded or fried meats. Processed meat. Avoid saturated fats. Dairy Full-fat yogurt, cheese, or milk. Beverages Sweetened drinks, such as soda or iced tea. The items listed above may not be a complete list of foods and beverages you should avoid. Contact a dietitian for more information. Questions to ask a health care provider  Do I need to meet with a diabetes educator?  Do I need to meet with a dietitian?  What number can I call if I have questions?  When are the best times to check my blood glucose? Where to find more information:  American Diabetes Association: diabetes.org  Academy of Nutrition and Dietetics: www.eatright.org  National Institute of Diabetes and Digestive and Kidney Diseases: www.niddk.nih.gov  Association of Diabetes Care and Education Specialists: www.diabeteseducator.org Summary  It is important to have healthy eating   habits because your blood sugar (glucose) levels are greatly affected by what you eat and drink.  A healthy meal plan will help you control your blood glucose and maintain a healthy lifestyle.  Your health care provider may recommend that you work with a dietitian to make a meal plan that is best for you.  Keep in mind that carbohydrates (carbs) and alcohol have immediate effects on your blood glucose levels. It is important to count carbs and to use alcohol carefully. This information is not intended to replace advice given to you by your health care provider. Make sure you discuss any questions you have with your health care provider. Document Revised: 07/14/2019 Document Reviewed: 07/14/2019 Elsevier Patient Education  2021 Elsevier Inc.  

## 2020-08-29 NOTE — Progress Notes (Signed)
Virtual Visit via Telephone Note  I connected with Venson Ferencz on 08/29/20 at 12:19 p.m by telephone and verified that I am speaking with the correct person using two identifiers.  Location: Patient: home Provider: office   I discussed the limitations, risks, security and privacy concerns of performing an evaluation and management service by telephone and the availability of in person appointments. I also discussed with the patient that there may be a patient responsible charge related to this service. The patient expressed understanding and agreed to proceed.   History of Present Illness: Pt with hx of HTN, HL, DM type 2, tob dep, asthma mod persistant, CAD (MI in 2010, no stent placement), CVA 2010 with residual deficit, obesity, CKD 3.  This is for hospital f/u.  Hospitalized 12/20-23/2021 with chest pain thought to be due to demand ischemia in the setting of markedly elevated blood pressure and renal dysfunction.  Echo revealed EF of 50 to 55% with no RWMA, moderate LVH and G2DD.  He was seen by cardiology.  Carvedilol increased to 25 mg twice a day, hydralazine 100 mg 3 times daily and Norvasc 10 mg daily.  Told to continue aspirin and statin therapy.  Clonidine was discontinued.  LDL was 71.  Atorvastatin was increased to 80 mg daily.  He was advised to discontinue smoking.  Referred to nephrology.  Creatinine at the time of discharge was 2.6.  Today: Patient reports that he is taking the hydralazine and carvedilol but has had financial difficulty in getting amlodipine.  States that he was given a free 1 to 2-week supply after he left the hospital.  Checks blood pressure about every other day.  Range has been  161-0 64 systolic.   He also is not taking Lipitor stating that he has been out of it for quite a while and was not given a prescription for it on discharge from the hospital.  He also is not taking aspirin for the same reason.  Denies any further chest pains.  Requests refill on his  Advair inhaler.  He has seen the cardiology PA at the end of last month.  He is scheduled for nuclear stress test on the 18th of this month. -Reports he had started smoking again since he last saw me but he has discontinued smoking since his recent hospitalization.   CKD stage III: Has an appointment coming up with nephrology on 24 February.  Noted to have microcytic anemia on review of blood test.  Patient tells me he was told in the past that he was anemic.  He has never been on iron.  Denies any blood in the stools or black stools.  Diabetes: States that he was taken off Metformin.  He does not check blood sugars.  Last A1c was done in May of this year and was 5.6 He feels he is doing okay with his eating habits.  Reports his weight is unchanged [place] 219 pounds.  Body mass index of 32.  HM: He declines flu shot and Pneumovax.  He has received 2 shots of the COVID-19 vaccine.  Last shot was in September. Patient Active Problem List   Diagnosis Date Noted  . Chest pain 08/08/2020  . GAD (generalized anxiety disorder) 02/25/2020  . Hyperlipidemia 02/25/2020  . Stage 3a chronic kidney disease ([place]) 02/25/2020  . Diabetes mellitus ([place]) 01/05/2020  . Essential hypertension 01/05/2020  . Tobacco dependence 01/05/2020  . Coronary artery disease involving native coronary artery of native heart without angina pectoris 01/05/2020  .  History of CVA with residual deficit 01/05/2020  . Moderate persistent asthma without complication 16/05/9603  . Class 1 obesity due to excess calories with serious comorbidity and body mass index (BMI) of 32.0 to 32.9 in adult 01/05/2020     Current Outpatient Medications on File Prior to Visit  Medication Sig Dispense Refill  . amLODipine (NORVASC) 10 MG tablet Take 1 tablet (10 mg total) by mouth daily. 90 tablet 3  . busPIRone (BUSPAR) 7.5 MG tablet Take 1 tablet (7.5 mg total) by mouth 3 (three) times daily. 60 tablet 3  . carvedilol (COREG) 25 MG tablet  Take 1 tablet (25 mg total) by mouth 2 (two) times daily with a meal. 60 tablet 1  . hydrALAZINE (APRESOLINE) 100 MG tablet Take 1 tablet (100 mg total) by mouth every 8 (eight) hours. 90 tablet 1  . nitroGLYCERIN (NITROSTAT) 0.6 MG SL tablet Place 0.6 mg under the tongue every 5 (five) minutes as needed for chest pain.     No current facility-administered medications on file prior to visit.   Observations/Objective:   Chemistry      Component Value Date/Time   NA 135 08/11/2020 0251   NA 138 02/25/2020 1507   K 3.9 08/11/2020 0251   CL 105 08/11/2020 0251   CO2 20 (L) 08/11/2020 0251   BUN 27 (H) 08/11/2020 0251   BUN 26 (H) 02/25/2020 1507   CREATININE 2.61 (H) 08/11/2020 0251      Component Value Date/Time   CALCIUM 8.9 08/11/2020 0251   ALKPHOS 108 01/05/2020 1515   AST 28 01/05/2020 1515   ALT 24 01/05/2020 1515   BILITOT 0.5 01/05/2020 1515     Lab Results  Component Value Date   WBC 5.8 08/11/2020   HGB 12.4 (L) 08/11/2020   HCT 36.2 (L) 08/11/2020   MCV 71.7 (L) 08/11/2020   PLT 176 08/11/2020   Lab Results  Component Value Date   CHOL 137 08/09/2020   HDL 44 08/09/2020   LDLCALC 71 08/09/2020   TRIG 110 08/09/2020   CHOLHDL 3.1 08/09/2020     Assessment and Plan: 1. Hospital discharge follow-up 2. Coronary artery disease involving native coronary artery of native heart without angina pectoris Plan for nuclear stress test next week.  I have sent a refill on Lipitor to the pharmacy but have sent prescription for 40 mg instead of 80 mg as he was not on it for several months.  I also sent prescription for aspirin.  3. Essential hypertension Discussed importance of good blood pressure control to prevent cardiovascular events and also to preserve kidney function.  He will speak with our pharmacy to see whether he can get the Norvasc on credit or work out a payment plan.  Continue carvedilol and hydralazine.  Blood pressure goal is 130/80 or lower.  4. Stage 3b  chronic kidney disease ([place]) Keep appointment with nephrology.  Avoid NSAIDs.  5. Type 2 diabetes mellitus with other circulatory complication, without long-term current use of insulin (HCC) Dietary counseling given. - Hemoglobin A1c; Future  6. Moderate persistent asthma without complication - Fluticasone-Salmeterol (ADVAIR DISKUS) 250-50 MCG/DOSE AEPB; Inhale 1 puff into the lungs 2 (two) times daily.  Dispense: 1 each; Refill: 6  7. Hyperlipidemia associated with type 2 diabetes mellitus ([place]) See #2 above  8. Former smoker Commended him on quitting.  Encouraged him to remain tobacco free  9. Microcytic anemia - Iron, TIBC and Ferritin Panel; Future  10. Influenza vaccination declined   11.  23-polyvalent pneumococcal polysaccharide vaccine declined  12. COVID-19 vaccine series not completed Advised to get his COVID booster 6 months after the last shot   Follow Up Instructions: 3 months   I discussed the assessment and treatment plan with the patient. The patient was provided an opportunity to ask questions and all were answered. The patient agreed with the plan and demonstrated an understanding of the instructions.   The patient was advised to call back or seek an in-person evaluation if the symptoms worsen or if the condition fails to improve as anticipated.  I provided 15 minutes of non-face-to-face time during this encounter.   Karle Plumber, MD  Patient ID: George Day, male   DOB: 05/02/1976, 45 y.o.   MRN: [id number]

## 2020-08-30 ENCOUNTER — Ambulatory Visit: Payer: Medicaid Other | Attending: Internal Medicine

## 2020-08-30 ENCOUNTER — Other Ambulatory Visit: Payer: Self-pay

## 2020-08-30 DIAGNOSIS — E1159 Type 2 diabetes mellitus with other circulatory complications: Secondary | ICD-10-CM

## 2020-08-30 DIAGNOSIS — D509 Iron deficiency anemia, unspecified: Secondary | ICD-10-CM

## 2020-08-31 ENCOUNTER — Encounter: Payer: Self-pay | Admitting: Internal Medicine

## 2020-08-31 DIAGNOSIS — D638 Anemia in other chronic diseases classified elsewhere: Secondary | ICD-10-CM | POA: Insufficient documentation

## 2020-08-31 DIAGNOSIS — D649 Anemia, unspecified: Secondary | ICD-10-CM | POA: Insufficient documentation

## 2020-08-31 LAB — IRON,TIBC AND FERRITIN PANEL
Ferritin: 126 ng/mL (ref 30–400)
Iron Saturation: 18 % (ref 15–55)
Iron: 51 ug/dL (ref 38–169)
Total Iron Binding Capacity: 286 ug/dL (ref 250–450)
UIBC: 235 ug/dL (ref 111–343)

## 2020-08-31 LAB — HEMOGLOBIN A1C
Est. average glucose Bld gHb Est-mCnc: 120 mg/dL
Hgb A1c MFr Bld: 5.8 % — ABNORMAL HIGH (ref 4.8–5.6)

## 2020-09-01 ENCOUNTER — Inpatient Hospital Stay: Payer: Medicaid Other | Admitting: Physician Assistant

## 2020-09-02 ENCOUNTER — Telehealth: Payer: Self-pay | Admitting: Internal Medicine

## 2020-09-02 ENCOUNTER — Telehealth (HOSPITAL_COMMUNITY): Payer: Self-pay | Admitting: *Deleted

## 2020-09-02 NOTE — Telephone Encounter (Signed)
Called patient and LVM to schedule a 3 month follow up visit with Dr. Wynetta Emery (around 11/27/2020). Advised patient to call [phone] to schedule.

## 2020-09-02 NOTE — Telephone Encounter (Signed)
Close encounter 

## 2020-09-06 ENCOUNTER — Inpatient Hospital Stay (HOSPITAL_COMMUNITY): Admission: RE | Admit: 2020-09-06 | Payer: Self-pay | Source: Ambulatory Visit

## 2020-09-12 ENCOUNTER — Other Ambulatory Visit: Payer: Self-pay | Admitting: Nephrology

## 2020-09-12 MED FILL — ?CHLORTHALIDONE 25 MG TABLE: 25 | 30 days supply | Qty: 30 | Fill #0

## 2020-09-12 MED FILL — ?CARVEDILOL 25 MG TABLET: 25 | 30 days supply | Qty: 60 | Fill #1

## 2020-09-14 ENCOUNTER — Other Ambulatory Visit: Payer: Self-pay | Admitting: Nephrology

## 2020-09-14 ENCOUNTER — Ambulatory Visit (HOSPITAL_COMMUNITY)
Admission: RE | Admit: 2020-09-14 | Discharge: 2020-09-14 | Disposition: A | Discharge status: Home / Self Care | Payer: Medicaid Other | Source: Ambulatory Visit | Attending: Cardiovascular Disease | Admitting: Cardiovascular Disease

## 2020-09-14 ENCOUNTER — Other Ambulatory Visit: Payer: Self-pay

## 2020-09-14 DIAGNOSIS — R079 Chest pain, unspecified: Secondary | ICD-10-CM | POA: Insufficient documentation

## 2020-09-14 DIAGNOSIS — N183 Chronic kidney disease, stage 3 unspecified: Secondary | ICD-10-CM

## 2020-09-14 LAB — MYOCARDIAL PERFUSION IMAGING
LV dias vol: 164 mL (ref 62–150)
LV sys vol: 96 mL
Peak HR: 97 {beats}/min
Rest HR: 71 {beats}/min
SDS: 0
SRS: 1
SSS: 1
TID: 1.02

## 2020-09-14 MED ORDER — TECHNETIUM TC 99M TETROFOSMIN IV KIT
30.3000 | PACK | Freq: Once | INTRAVENOUS | Status: AC | PRN
  Administered 2020-09-14: 30.3 via INTRAVENOUS
  Filled 2020-09-14: qty 31

## 2020-09-14 MED ORDER — TECHNETIUM TC 99M TETROFOSMIN IV KIT
10.8000 | PACK | Freq: Once | INTRAVENOUS | Status: AC | PRN
  Administered 2020-09-14: 10.8 via INTRAVENOUS
  Filled 2020-09-14: qty 11

## 2020-09-14 MED ORDER — REGADENOSON 0.4 MG/5ML IV SOLN
0.4000 mg | Freq: Once | INTRAVENOUS | Status: AC
  Administered 2020-09-14: 0.4 mg via INTRAVENOUS

## 2020-09-15 NOTE — Progress Notes (Signed)
No sign of reversible blockage to fix, although the quoted pumping function on the stress test is borderline low, however recent echo showed normal pumping function and echo is a more accurate study when it comes to ejection fraction.

## 2020-09-21 ENCOUNTER — Encounter: Payer: Self-pay | Admitting: Internal Medicine

## 2020-09-21 NOTE — Progress Notes (Signed)
I received a note from nephrology Dr. Candiss Norse.  Patient was seen 09/12/2020. 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  There is a chance that this is just progression of disease and this is his new baseline.  Risk modifications discussed.  Awaiting stability of labs before adding protective meds like ACE/ARB and/or SGLT2 inhibitor.  Creatinine was 2.58 with GFR of 33.  Potassium 4.1.  Hemoglobin 12.8 with MCV of 75. PTH intact 72 Urine microalbumin 171  -Hypertensive renal disease: Uncontrolled.  Chlorthalidone 25 mg daily added.

## 2020-09-27 ENCOUNTER — Other Ambulatory Visit: Payer: Medicaid Other

## 2020-09-27 ENCOUNTER — Ambulatory Visit: Payer: Medicaid Other | Admitting: Physician Assistant

## 2020-10-11 ENCOUNTER — Ambulatory Visit
Admission: RE | Admit: 2020-10-11 | Discharge: 2020-10-11 | Disposition: A | Discharge status: Home / Self Care | Payer: Self-pay | Source: Ambulatory Visit | Attending: Nephrology | Admitting: Nephrology

## 2020-10-11 ENCOUNTER — Other Ambulatory Visit: Payer: Self-pay | Admitting: Physician Assistant

## 2020-10-11 ENCOUNTER — Encounter: Payer: Self-pay | Admitting: Physician Assistant

## 2020-10-11 ENCOUNTER — Ambulatory Visit (INDEPENDENT_AMBULATORY_CARE_PROVIDER_SITE_OTHER): Payer: Medicaid Other | Admitting: Physician Assistant

## 2020-10-11 ENCOUNTER — Other Ambulatory Visit: Payer: Self-pay

## 2020-10-11 VITALS — BP 180/120 | HR 72 | Ht 69.0 in | Wt 216.0 lb

## 2020-10-11 DIAGNOSIS — I2510 Atherosclerotic heart disease of native coronary artery without angina pectoris: Secondary | ICD-10-CM

## 2020-10-11 DIAGNOSIS — E785 Hyperlipidemia, unspecified: Secondary | ICD-10-CM

## 2020-10-11 DIAGNOSIS — I10 Essential (primary) hypertension: Secondary | ICD-10-CM | POA: Diagnosis not present

## 2020-10-11 DIAGNOSIS — N184 Chronic kidney disease, stage 4 (severe): Secondary | ICD-10-CM | POA: Diagnosis not present

## 2020-10-11 DIAGNOSIS — E119 Type 2 diabetes mellitus without complications: Secondary | ICD-10-CM

## 2020-10-11 DIAGNOSIS — N183 Chronic kidney disease, stage 3 unspecified: Secondary | ICD-10-CM

## 2020-10-11 DIAGNOSIS — Z8673 Personal history of transient ischemic attack (TIA), and cerebral infarction without residual deficits: Secondary | ICD-10-CM

## 2020-10-11 MED ORDER — HYDRALAZINE HCL 100 MG PO TABS: 100.0000 mg | ORAL_TABLET | Freq: Three times a day (TID) | ORAL | 3 refills | Status: DC

## 2020-10-11 MED ORDER — AMLODIPINE BESYLATE 10 MG PO TABS: 10.0000 mg | ORAL_TABLET | Freq: Every day | ORAL | 3 refills | Status: DC

## 2020-10-11 MED ORDER — CARVEDILOL 25 MG PO TABS: 25.0000 mg | ORAL_TABLET | Freq: Two times a day (BID) | ORAL | 3 refills | Status: DC

## 2020-10-11 MED FILL — ?CARVEDILOL 25 MG TABLET: 25 | 30 days supply | Qty: 60 | Fill #0

## 2020-10-11 MED FILL — hydrALAZINE HCL 100 MG TABS: 100 | 30 days supply | Qty: 90 | Fill #0

## 2020-10-11 MED FILL — AMLODIPINE BESYLATE 10 MG T: 10 | 30 days supply | Qty: 30 | Fill #0

## 2020-10-11 NOTE — Progress Notes (Signed)
Cardiology Office Note:    Date:  10/12/2020   ID:  George Day, DOB [birth year], MRN [id number]  PCP:  Ladell Pier, MD   [place]  Cardiologist:  Quay Burow, MD  Advanced Practice Provider:  No care team member to display Electrophysiologist:  None   Referring MD: Ladell Pier, MD   Chief Complaint  Patient presents with  . Follow-up    Seen for Dr. Gwenlyn Found    History of Present Illness:    George Day is a 45 y.o. male with a hx of CAD, HTN, HLD, DM II, CKD, history of CVA and tobacco abuse. He had a history of MI in 2010, cardiac catheterization was performed however no PCI per patient report.  The procedure was done in [facility].  Patient presented to the ED on 08/08/2020 with chest pain.  Serial troponin was flat.  EKG showed sinus rhythm with anterior Q waves and early repolarization.  Creatinine on arrival was 2.62, baseline was 1.83 in May 2021.  Echocardiogram performed on 08/09/2020 showed EF 50 to 55%, no regional wall motion abnormality, grade 2 DD, trivial to small pericardial effusion that is circumferential.  Blood pressure was markedly elevated during this admission, borderline elevated troponin was felt to be related to high blood pressure.  Renal artery duplex obtained on 08/11/2020 was normal.  Carvedilol was increased to 25 mg twice daily.  Hydralazine increased to 100 mg 3 times daily.  Clonidine was stopped due to questionable compliance.  Lipitor increased to 80 mg daily.  I last saw the patient on 08/16/2020 at which time he continued to have intermittent chest pain.  We arranged outpatient Myoview.  I also referred the patient to nephrology service.  He was not taking the amlodipine due to miscommunication, I restarted amlodipine at 10 mg daily.  Myoview obtained on 09/14/2020 showed EF 42%, ST segment depression noted in lead II, III, aVF and V6, no evidence of ischemia or infarction on the Myoview perfusion  study, intermediate risk study due to moderately reduced LVEF.  Since echocardiogram in late December 2021 showed normal EF, we did not repeat another echocardiogram.  Patient presents today for follow-up.  Overall he is doing well, however he was unable to get the prescription for hydralazine and amlodipine.  He is still taking carvedilol 25 mg twice a day and Lipitor.  We will give him some aspirin samples.  I decided to print out the hydralazine and amlodipine prescription for him to take to the local pharmacy.  I will defer BuSpar refill to his PCP.  He can follow-up with Dr. Gwenlyn Found next month.  Patient has upcoming visit with [place] to follow-up on his renal function.   Past Medical History:  Diagnosis Date  . Anemia 1998  . Anginal pain ([place])   . Anxiety   . Asthma   . Coronary artery disease   . Diabetes mellitus without complication ([place]) 6948  . Hypertension 2019  . MI (myocardial infarction) ([place]) 2010  . Stroke [facility]) 2010    Past Surgical History:  Procedure Laterality Date  . CARDIAC CATHETERIZATION  2010    Current Medications: Current Meds  Medication Sig  . atorvastatin (LIPITOR) 40 MG tablet Take 1 tablet (40 mg total) by mouth daily.  . Fluticasone-Salmeterol (ADVAIR DISKUS) 250-50 MCG/DOSE AEPB Inhale 1 puff into the lungs 2 (two) times daily.  . [DISCONTINUED] carvedilol (COREG) 25 MG tablet Take 1 tablet (25 mg total) by mouth  2 (two) times daily with a meal.     Allergies:   Penicillins and Tomato   Social History   Socioeconomic History  . Marital status: Unknown    Spouse name: Not on file  . Number of children: 3  . Years of education: Not on file  . Highest education level: Not on file  Occupational History  . Not on file  Tobacco Use  . Smoking status: Current Some Day Smoker    Packs/day: 0.50    Years: 31.00    Pack years: 15.50    Types: Cigars, E-cigarettes  . Smokeless tobacco: Never Used  Vaping Use  . Vaping Use:  Never used  Substance and Sexual Activity  . Alcohol use: Yes    Comment: occasioanlly   . Drug use: Never  . Sexual activity: Yes    Birth control/protection: None  Other Topics Concern  . Not on file  Social History Narrative  . Not on file   Social Determinants of Health   Financial Resource Strain: Not on file  Food Insecurity: Not on file  Transportation Needs: Not on file  Physical Activity: Not on file  Stress: Not on file  Social Connections: Not on file     Family History: The patient's family history includes Diabetes in his brother, mother, and sister; Hearing loss in his sister; Hyperlipidemia in his brother, mother, and sister.  ROS:   Please see the history of present illness.     All other systems reviewed and are negative.  EKGs/Labs/Other Studies Reviewed:    The following studies were reviewed today:  Echo 08/09/2020 1. Blood pressure is significantly elevated. Left ventricular ejection  fraction, by estimation, is 50 to 55%. The left ventricle has low normal  function. The left ventricle has no regional wall motion abnormalities.  There is moderate left ventricular  hypertrophy. Left ventricular diastolic parameters are consistent with  Grade II diastolic dysfunction (pseudonormalization). Elevated left  ventricular end-diastolic pressure.  2. Right ventricular systolic function is normal. The right ventricular  size is normal.  3. Left atrial size was mildly dilated.  4. A trivial to small pericardial effusion is present. The pericardial  effusion is circumferential.  5. The mitral valve is abnormal. Trivial mitral valve regurgitation.  6. The aortic valve is tricuspid. Aortic valve regurgitation is not  visualized.  7. The inferior vena cava is normal in size with greater than 50%  respiratory variability, suggesting right atrial pressure of 3 mmHg.   Comparison(s): No prior Echocardiogram.    Myoview 09/14/2020  The left ventricular  ejection fraction is moderately decreased (30-44%).  Nuclear stress EF: 42%.  ST segment depression was noted during stress in the II, III, aVF and V6 leads, and returning to baseline after 1-5 minutes of recovery.  No evidence of ischemia or infarct on nuclear perfusion study.  This is an intermediate risk study due to moderately reduced LVEF.      EKG:  EKG is not ordered today.   Recent Labs: 01/05/2020: ALT 24 08/08/2020: B Natriuretic Peptide 134.8 08/11/2020: BUN 27; Creatinine, Ser 2.61; Hemoglobin 12.4; Platelets 176; Potassium 3.9; Sodium 135  Recent Lipid Panel    Component Value Date/Time   CHOL 137 08/09/2020 0242   CHOL 181 01/05/2020 1515   TRIG 110 08/09/2020 0242   HDL 44 08/09/2020 0242   HDL 41 01/05/2020 1515   CHOLHDL 3.1 08/09/2020 0242   VLDL 22 08/09/2020 0242   LDLCALC 71 08/09/2020 0242  LDLCALC 111 (H) 01/05/2020 1515     Risk Assessment/Calculations:       Physical Exam:    VS:  BP (!) 180/120   Pulse 72   Ht 5\' 9"  (1.753 m)   Wt 216 lb (98 kg)   SpO2 98%   BMI 31.90 kg/m     Wt Readings from Last 3 Encounters:  10/11/20 216 lb (98 kg)  09/14/20 219 lb (99.3 kg)  08/16/20 219 lb (99.3 kg)     GEN:  Well nourished, well developed in no acute distress HEENT: Normal NECK: No JVD; No carotid bruits LYMPHATICS: No lymphadenopathy CARDIAC: RRR, no murmurs, rubs, gallops RESPIRATORY:  Clear to auscultation without rales, wheezing or rhonchi  ABDOMEN: Soft, non-tender, non-distended MUSCULOSKELETAL:  No edema; No deformity  SKIN: Warm and dry NEUROLOGIC:  Alert and oriented x 3 PSYCHIATRIC:  Normal affect   ASSESSMENT:    1. Coronary artery disease involving native coronary artery of native heart without angina pectoris   2. Chronic kidney disease (CKD), stage IV (severe) ([place])   3. Essential hypertension   4. Hyperlipidemia LDL goal <70   5. Controlled type 2 diabetes mellitus without complication, without long-term current  use of insulin ([place])   6. H/O: CVA (cerebrovascular accident)    PLAN:    In order of problems listed above:  1. CAD: Denies any chest pain.  Recent Myoview was negative for ischemia.  2. CKD stage IV: Patient has been referred to nephrology service  3. Hypertension: He was unable to get the prescription for hydralazine and amlodipine for some reason.  I have rewritten prescription for both.  He will start amlodipine first and then start hydralazine in 5 days.  4. Hyperlipidemia: On Lipitor  5. DM2: Managed by primary care provider  6. History of CVA: No recurrence recently.      Medication Adjustments/Labs and Tests Ordered: Current medicines are reviewed at length with the patient today.  Concerns regarding medicines are outlined above.  Orders Placed This Encounter  Procedures  . Lipid panel  . Hepatic function panel   Meds ordered this encounter  Medications  . amLODipine (NORVASC) 10 MG tablet    Sig: Take 1 tablet (10 mg total) by mouth daily.    Dispense:  90 tablet    Refill:  3  . carvedilol (COREG) 25 MG tablet    Sig: Take 1 tablet (25 mg total) by mouth 2 (two) times daily with a meal.    Dispense:  180 tablet    Refill:  3  . DISCONTD: hydrALAZINE (APRESOLINE) 100 MG tablet    Sig: Take 1 tablet (100 mg total) by mouth every 8 (eight) hours.    Dispense:  270 tablet    Refill:  3  . hydrALAZINE (APRESOLINE) 100 MG tablet    Sig: Take 1 tablet (100 mg total) by mouth every 8 (eight) hours.    Dispense:  270 tablet    Refill:  3    Patient Instructions  Medication Instructions:   START Amlodipine 10 mg daily-TODAY  START Hydralazine 100 MG every 8 hours-Sunday 10/16/20  *If you need a refill on your cardiac medications before your next appointment, please call your pharmacy*  Lab Work: Your physician recommends that you return for lab work on 11/15/20 your follow up appointment with Dr. Gwenlyn Found:   Fasting Lipid Panel-DO NOT EAT OR DRINK PAST  MIDNIGHT. OKAY TO HAVE  Hepatic (Liver) Function Test    If you have  labs (blood work) drawn today and your tests are completely normal, you will receive your results only by: Marland Kitchen MyChart Message (if you have MyChart) OR . A paper copy in the mail If you have any lab test that is abnormal or we need to change your treatment, we will call you to review the results.  Testing/Procedures: NONE ordered at this time of appointment   Follow-Up: At [facility], you and your health needs are our priority.  As part of our continuing mission to provide you with exceptional heart care, we have created designated Provider Care Teams.  These Care Teams include your primary Cardiologist (physician) and Advanced Practice Providers (APPs -  Physician Assistants and Nurse Practitioners) who all work together to provide you with the care you need, when you need it.  Your next appointment:   1 month(s)  The format for your next appointment:   In Person  Provider:   Quay Burow, MD  Other Instructions  Monitor blood pressure at home. If the top number is less than 110 give our office a call.     Hilbert Corrigan, [place]  10/12/2020 8:55 PM    [facility]

## 2020-10-11 NOTE — Patient Instructions (Addendum)
Medication Instructions:   START Amlodipine 10 mg daily-TODAY  START Hydralazine 100 MG every 8 hours-Sunday 10/16/20  *If you need a refill on your cardiac medications before your next appointment, please call your pharmacy*  Lab Work: Your physician recommends that you return for lab work on 11/15/20 your follow up appointment with Dr. Gwenlyn Found:   Fasting Lipid Panel-DO NOT EAT OR DRINK PAST MIDNIGHT. OKAY TO HAVE  Hepatic (Liver) Function Test    If you have labs (blood work) drawn today and your tests are completely normal, you will receive your results only by: Marland Kitchen MyChart Message (if you have MyChart) OR . A paper copy in the mail If you have any lab test that is abnormal or we need to change your treatment, we will call you to review the results.  Testing/Procedures: NONE ordered at this time of appointment   Follow-Up: At [facility], you and your health needs are our priority.  As part of our continuing mission to provide you with exceptional heart care, we have created designated Provider Care Teams.  These Care Teams include your primary Cardiologist (physician) and Advanced Practice Providers (APPs -  Physician Assistants and Nurse Practitioners) who all work together to provide you with the care you need, when you need it.  Your next appointment:   1 month(s)  The format for your next appointment:   In Person  Provider:   Quay Burow, MD  Other Instructions  Monitor blood pressure at home. If the top number is less than 110 give our office a call.

## 2020-10-12 ENCOUNTER — Encounter: Payer: Self-pay | Admitting: Physician Assistant

## 2020-10-19 ENCOUNTER — Other Ambulatory Visit: Payer: Self-pay | Admitting: Nephrology

## 2020-10-19 DIAGNOSIS — R1900 Intra-abdominal and pelvic swelling, mass and lump, unspecified site: Secondary | ICD-10-CM

## 2020-11-02 ENCOUNTER — Ambulatory Visit
Admission: RE | Admit: 2020-11-02 | Discharge: 2020-11-02 | Disposition: A | Discharge status: Home / Self Care | Payer: Medicaid Other | Source: Ambulatory Visit | Attending: Nephrology | Admitting: Nephrology

## 2020-11-02 DIAGNOSIS — R1900 Intra-abdominal and pelvic swelling, mass and lump, unspecified site: Secondary | ICD-10-CM

## 2020-11-15 ENCOUNTER — Ambulatory Visit (INDEPENDENT_AMBULATORY_CARE_PROVIDER_SITE_OTHER): Payer: Medicaid Other | Admitting: Cardiovascular Disease

## 2020-11-15 ENCOUNTER — Encounter: Payer: Self-pay | Admitting: Cardiovascular Disease

## 2020-11-15 ENCOUNTER — Other Ambulatory Visit: Payer: Self-pay

## 2020-11-15 VITALS — BP 170/110 | HR 88 | Ht 69.0 in | Wt 224.0 lb

## 2020-11-15 DIAGNOSIS — I10 Essential (primary) hypertension: Secondary | ICD-10-CM

## 2020-11-15 DIAGNOSIS — I2510 Atherosclerotic heart disease of native coronary artery without angina pectoris: Secondary | ICD-10-CM | POA: Diagnosis not present

## 2020-11-15 DIAGNOSIS — E785 Hyperlipidemia, unspecified: Secondary | ICD-10-CM

## 2020-11-15 DIAGNOSIS — E782 Mixed hyperlipidemia: Secondary | ICD-10-CM

## 2020-11-15 NOTE — Assessment & Plan Note (Signed)
History of CAD status post cardiac catheterization in [place] in 2010.  The patient says no stent was implanted or angioplasty performed.

## 2020-11-15 NOTE — Patient Instructions (Signed)
Medication Instructions:  Your physician recommends that you continue on your current medications as directed. Please refer to the Current Medication list given to you today.  *If you need a refill on your cardiac medications before your next appointment, please call your pharmacy*   Lab Work: Your physician recommends that you have labs drawn today: lipid/liver profile  If you have labs (blood work) drawn today and your tests are completely normal, you will receive your results only by: Marland Kitchen MyChart Message (if you have MyChart) OR . A paper copy in the mail If you have any lab test that is abnormal or we need to change your treatment, we will call you to review the results.   Follow-Up: At [facility], you and your health needs are our priority.  As part of our continuing mission to provide you with exceptional heart care, we have created designated Provider Care Teams.  These Care Teams include your primary Cardiologist (physician) and Advanced Practice Providers (APPs -  Physician Assistants and Nurse Practitioners) who all work together to provide you with the care you need, when you need it.  We recommend signing up for the patient portal called "MyChart".  Sign up information is provided on this After Visit Summary.  MyChart is used to connect with patients for Virtual Visits (Telemedicine).  Patients are able to view lab/test results, encounter notes, upcoming appointments, etc.  Non-urgent messages can be sent to your provider as well.   To learn more about what you can do with MyChart, go to [email or web address].    Your next appointment:   6 month(s)  The format for your next appointment:   In Person  Provider:   You will see one of the following Advanced Practice Providers on your designated Care Team:   -Almyra Deforest, PA-C  Then, Quay Burow, MD will plan to see you again in 12 month(s).    Other Instructions Referral made to hypertension clinic with Dr. Oval Linsey.

## 2020-11-15 NOTE — Assessment & Plan Note (Signed)
History of essential pretension with negative renal Doppler studies intolerant to amlodipine, hydralazine and carvedilol.  His blood pressure today is 170/110 which she says is low for him.  I am going to refer him to Dr. Leone Brand hypertension clinic for further evaluation and treatment.  He does have moderate renal insufficiency with a serum creatinine in the mid 2 range.

## 2020-11-15 NOTE — Progress Notes (Signed)
11/15/2020 George Day   12/04/1975  [id number]  Primary Physician Ladell Pier, MD Primary Cardiologist: Lorretta Harp MD Renae Gloss  HPI:  George Day is a 45 y.o. mildly overweight married African-American American male father of 3 daughters who relocated from [place] to [place].  He is accompanied by his wife George Day .  I initially saw him during his hospitalization on 08/08/2020.  He has a history of difficult to control hypertension, diet-controlled diabetes and hyperlipidemia as well as moderate renal insufficiency.  He had renal Doppler studies that did not show evidence of renal artery stenosis, 2D echo that showed low normal EF with moderate concentric LVH and Myoview stress test that was low risk as well without significant ischemia.  Since his hospitalization he denies chest pain.  He gets occasional shortness of breath which is improved with inhaled bronchodilators.  He has been intolerant to his antihypertensive medications however.   Current Meds  Medication Sig  . aspirin EC 81 MG tablet Take 1 tablet (81 mg total) by mouth daily. Swallow whole.  Marland Kitchen Fluticasone-Salmeterol (ADVAIR DISKUS) 250-50 MCG/DOSE AEPB Inhale 1 puff into the lungs 2 (two) times daily.  . hydrALAZINE (APRESOLINE) 100 MG tablet Take 1 tablet (100 mg total) by mouth every 8 (eight) hours.  . nitroGLYCERIN (NITROSTAT) 0.6 MG SL tablet Place 0.6 mg under the tongue every 5 (five) minutes as needed for chest pain.     Allergies  Allergen Reactions  . Penicillins Anaphylaxis    ALL  . Tomato Swelling    Social History   Socioeconomic History  . Marital status: Unknown    Spouse name: Not on file  . Number of children: 3  . Years of education: Not on file  . Highest education level: Not on file  Occupational History  . Not on file  Tobacco Use  . Smoking status: Current Some Day Smoker    Packs/day: 0.50    Years: 31.00    Pack years: 15.50    Types:  Cigars, E-cigarettes  . Smokeless tobacco: Never Used  Vaping Use  . Vaping Use: Never used  Substance and Sexual Activity  . Alcohol use: Yes    Comment: occasioanlly   . Drug use: Never  . Sexual activity: Yes    Birth control/protection: None  Other Topics Concern  . Not on file  Social History Narrative  . Not on file   Social Determinants of Health   Financial Resource Strain: Not on file  Food Insecurity: Not on file  Transportation Needs: Not on file  Physical Activity: Not on file  Stress: Not on file  Social Connections: Not on file  Intimate Partner Violence: Not on file     Review of Systems: General: negative for chills, fever, night sweats or weight changes.  Cardiovascular: negative for chest pain, dyspnea on exertion, edema, orthopnea, palpitations, paroxysmal nocturnal dyspnea or shortness of breath Dermatological: negative for rash Respiratory: negative for cough or wheezing Urologic: negative for hematuria Abdominal: negative for nausea, vomiting, diarrhea, bright red blood per rectum, melena, or hematemesis Neurologic: negative for visual changes, syncope, or dizziness All other systems reviewed and are otherwise negative except as noted above.    Blood pressure (!) 170/110, pulse 88, height 5\' 9"  (1.753 m), weight 224 lb (101.6 kg), SpO2 98 %.  General appearance: alert and no distress Neck: no adenopathy, no carotid bruit, no JVD, supple, symmetrical, trachea midline and thyroid not enlarged, symmetric, no  tenderness/mass/nodules Lungs: clear to auscultation bilaterally Heart: regular rate and rhythm, S1, S2 normal, no murmur, click, rub or gallop Extremities: extremities normal, atraumatic, no cyanosis or edema Pulses: 2+ and symmetric Skin: Skin color, texture, turgor normal. No rashes or lesions Neurologic: Alert and oriented X 3, normal strength and tone. Normal symmetric reflexes. Normal coordination and gait  EKG not performed  today  ASSESSMENT AND PLAN:   Essential hypertension History of essential pretension with negative renal Doppler studies intolerant to amlodipine, hydralazine and carvedilol.  His blood pressure today is 170/110 which she says is low for him.  I am going to refer him to Dr. Leone Brand hypertension clinic for further evaluation and treatment.  He does have moderate renal insufficiency with a serum creatinine in the mid 2 range.  Coronary artery disease involving native coronary artery of native heart without angina pectoris History of CAD status post cardiac catheterization in [place] in 2010.  The patient says no stent was implanted or angioplasty performed.  Hyperlipidemia History of hyperlipidemia on statin therapy with lipid profile performed 08/09/2020 revealing total cholesterol 137, LDL 71 8 and HDL 44.      Lorretta Harp MD FACP,FACC,FAHA, [facility] 11/15/2020 11:02 AM

## 2020-11-15 NOTE — Assessment & Plan Note (Signed)
History of hyperlipidemia on statin therapy with lipid profile performed 08/09/2020 revealing total cholesterol 137, LDL 71 8 and HDL 44.

## 2020-11-23 ENCOUNTER — Encounter: Payer: Self-pay | Admitting: Internal Medicine

## 2020-11-23 NOTE — Progress Notes (Signed)
Patient seen by his nephrologist Dr. Candiss Norse on 11/17/2020. Diagnoses were: CKD stage III -still awaiting for stability of labs but blood pressure control takes priority especially given that he is not on any antihypertensives whatsoever at the moment.  Creatinine 2.35 GFR 34 HTN: Uncontrolled.  We will need to get there slowly given his longstanding history of uncontrolled HTN.  Patient resistant to adding back all of these medications which is a reasonable concern.  We will start very slow as per his preference.  I instructed him to at least start amlodipine 10 mg daily fortunately he will be seeing Dr. Oval Linsey in hypertension clinic. Adrenal mass: Check aldosterone, renin levels today.  Possible culprit for HTN.  May just need to get an MRI with a second generation contrast agent instead versus CT adrenal protocol with IV contrast.  We will look into this with first will refer to endocrinology for further evaluation.  I would favor doing CT with contrast as it would be the preferred imaging method. Claudication: Given exam and symptomatology I am most concerned about the fact that he has developed PAD.  We will send this note to Dr. Alvester Chou and his PA for further evaluation.

## 2020-11-28 ENCOUNTER — Ambulatory Visit: Payer: Medicaid Other | Attending: Internal Medicine | Admitting: Internal Medicine

## 2020-11-28 ENCOUNTER — Other Ambulatory Visit: Payer: Self-pay

## 2020-11-28 ENCOUNTER — Encounter: Payer: Self-pay | Admitting: Internal Medicine

## 2020-11-28 VITALS — BP 191/128 | HR 76 | Resp 16 | Wt 222.0 lb

## 2020-11-28 DIAGNOSIS — Z87891 Personal history of nicotine dependence: Secondary | ICD-10-CM

## 2020-11-28 DIAGNOSIS — Z23 Encounter for immunization: Secondary | ICD-10-CM

## 2020-11-28 DIAGNOSIS — I10 Essential (primary) hypertension: Secondary | ICD-10-CM | POA: Diagnosis not present

## 2020-11-28 DIAGNOSIS — I25118 Atherosclerotic heart disease of native coronary artery with other forms of angina pectoris: Secondary | ICD-10-CM | POA: Diagnosis not present

## 2020-11-28 DIAGNOSIS — E1159 Type 2 diabetes mellitus with other circulatory complications: Secondary | ICD-10-CM

## 2020-11-28 DIAGNOSIS — L602 Onychogryphosis: Secondary | ICD-10-CM

## 2020-11-28 DIAGNOSIS — E278 Other specified disorders of adrenal gland: Secondary | ICD-10-CM

## 2020-11-28 DIAGNOSIS — N1832 Chronic kidney disease, stage 3b: Secondary | ICD-10-CM

## 2020-11-28 MED ORDER — CARVEDILOL 3.125 MG PO TABS
3.1250 mg | ORAL_TABLET | Freq: Two times a day (BID) | ORAL | 6 refills | Status: DC
  Filled 2020-11-28: qty 60, 30d supply, fill #0

## 2020-11-28 MED ORDER — ATORVASTATIN CALCIUM 40 MG PO TABS
40.0000 mg | ORAL_TABLET | Freq: Every day | ORAL | 3 refills | Status: DC
  Filled 2020-11-28: qty 30, 30d supply, fill #0

## 2020-11-28 NOTE — Patient Instructions (Addendum)
Restart carvedilol at the lowest dose of 3.125 mg twice a day.  I have sent this prescription to the pharmacy for you.  Continue amlodipine.  Keep the follow-up appointment coming up with the cardiologist at the end of next month.  I will touch base with your nephrologist to see whether he plans to repeat imaging study of your adrenal gland to evaluate the mass on the right adrenal gland.  Td (Tetanus, Diphtheria) Vaccine: What You Need to Know 1. Why get vaccinated? Td vaccine can prevent tetanus and diphtheria. Tetanus enters the body through cuts or wounds. Diphtheria spreads from person to person.  TETANUS (T) causes painful stiffening of the muscles. Tetanus can lead to serious health problems, including being unable to open the mouth, having trouble swallowing and breathing, or death.  DIPHTHERIA (D) can lead to difficulty breathing, heart failure, paralysis, or death. 2. Td vaccine Td is only for children 7 years and older, adolescents, and adults.  Td is usually given as a booster dose every 10 years, or after 5 years in the case of a severe or dirty wound or burn. Another vaccine, called "Tdap," may be used instead of Td. Tdap protects against pertussis, also known as "whooping cough," in addition to tetanus and diphtheria. Td may be given at the same time as other vaccines. 3. Talk with your health care provider Tell your vaccination provider if the person getting the vaccine:  Has had an allergic reaction after a previous dose of any vaccine that protects against tetanus or diphtheria, or has any severe, life-threatening allergies  Has ever had Guillain-Barr Syndrome (also called "GBS")  Has had severe pain or swelling after a previous dose of any vaccine that protects against tetanus or diphtheria In some cases, your health care provider may decide to postpone Td vaccination until a future visit. People with minor illnesses, such as a cold, may be vaccinated. People who are  moderately or severely ill should usually wait until they recover before getting Td vaccine.  Your health care provider can give you more information. 4. Risks of a vaccine reaction  Pain, redness, or swelling where the shot was given, mild fever, headache, feeling tired, and nausea, vomiting, diarrhea, or stomachache sometimes happen after Td vaccination. People sometimes faint after medical procedures, including vaccination. Tell your provider if you feel dizzy or have vision changes or ringing in the ears.  As with any medicine, there is a very remote chance of a vaccine causing a severe allergic reaction, other serious injury, or death. 5. What if there is a serious problem? An allergic reaction could occur after the vaccinated person leaves the clinic. If you see signs of a severe allergic reaction (hives, swelling of the face and throat, difficulty breathing, a fast heartbeat, dizziness, or weakness), call 9-1-1 and get the person to the nearest hospital.  For other signs that concern you, call your health care provider.  Adverse reactions should be reported to the Vaccine Adverse Event Reporting System (VAERS). Your health care provider will usually file this report, or you can do it yourself. Visit the VAERS website at www.vaers.SamedayNews.es or call (479)088-3295. VAERS is only for reporting reactions, and VAERS staff members do not give medical advice. 6. The National Vaccine Injury Compensation Program The [employer] Vaccine Injury Compensation Program (VICP) is a federal program that was created to compensate people who may have been injured by certain vaccines. Claims regarding alleged injury or death due to vaccination have a time limit for  filing, which may be as short as two years. Visit the VICP website at GoldCloset.com.ee or call 301-240-6752 to learn about the program and about filing a claim. 7. How can I learn more?  Ask your health care provider.  Call your local or  state health department.  Visit the website of the Food and Drug Administration (FDA) for vaccine package inserts and additional information at TraderRating.uy.  Contact the Centers for Disease Control and Prevention (CDC): ? Call (859)147-5294 (1-800-CDC-INFO) or ? Visit CDC's website at http://hunter.com/. Vaccine Information Statement Td (Tetanus, Diphtheria) Vaccine (03/25/2020) This information is not intended to replace advice given to you by your health care provider. Make sure you discuss any questions you have with your health care provider. Document Revised: 05/12/2020 Document Reviewed: 05/12/2020 Elsevier Patient Education  2021 [employer].

## 2020-11-28 NOTE — Progress Notes (Signed)
Patient ID: George Day, male    DOB: May 30, 1976  MRN: [id number]  CC: Hypertension   Subjective: George Day is a 45 y.o. male who presents for chronic ds management.  Last eval 08/2020 His concerns today include:  Pt with hx of HTN, HL, DM type 2,former tob dep,asthmamod persistant, CAD (MI in 2010, no stent placement), CVA 2010 with residual deficit, obesity, CKD 3.    HTN/CAD: Poor blood pressure control has been an ongoing issue.  Patient had stopped all of his medications because he did not feel well when he took them.  Saw his cardiologist Dr. Alvester Chou the end of last month.  He referred him to hypertension clinic with Dr. Skeet Latch.  That appointment is next month.   Also saw his kidney specialist Dr. Candiss Norse the end of last month.  He recommended that the patient at least restart amlodipine and that we slowly readd the other medications which were hydralazine, carvedilol and chlorthalidone.  Restarted Norvasc and took already today.  Dr. Candiss Norse had done a CT of his abdomen last month.  Incidental finding was right-sided adrenal mass.  According to his note he had plan to check renin and aldosterone levels and had talked about referring him to endocrinology and possibly getting CAT scan with IV contrast to evaluate further. Patient tells me that he feels disoriented when he takes Hydralazine and Coreg Checks BP several times a day.  BP still high Limits salt in foods Endorses SOB sometimes when laying in bed.  CP sometimes.  No recent SL Nitro use -He is not taking atorvastatin.  States he was told to not take it by the kidney specialist.  However I do not see that mentioned in his note.  Tob dep:  Quit 08/2020.    DM:  Not checking BS.  Last A1c the beginning of this year was 5.8.  He is not on any medications.  Overdue for eye exam.  Waiting for his Medicaid to transfer to the state.  HM: Due for colon cancer screening.  States that he would prefer to have colonoscopy but  he will wait until his Medicaid transfers. Patient Active Problem List   Diagnosis Date Noted  . Anemia, chronic disease 08/31/2020  . Former smoker 08/29/2020  . Hyperlipidemia associated with type 2 diabetes mellitus ([place]) 08/29/2020  . Stage 3b chronic kidney disease ([place]) 08/29/2020  . Microcytic anemia 08/29/2020  . Influenza vaccination declined 08/29/2020  . 23-polyvalent pneumococcal polysaccharide vaccine declined 08/29/2020  . Chest pain 08/08/2020  . GAD (generalized anxiety disorder) 02/25/2020  . Hyperlipidemia 02/25/2020  . Stage 3a chronic kidney disease ([place]) 02/25/2020  . Diabetes mellitus ([place]) 01/05/2020  . Essential hypertension 01/05/2020  . Tobacco dependence 01/05/2020  . Coronary artery disease involving native coronary artery of native heart without angina pectoris 01/05/2020  . History of CVA with residual deficit 01/05/2020  . Moderate persistent asthma without complication 32/44/0102  . Class 1 obesity due to excess calories with serious comorbidity and body mass index (BMI) of 32.0 to 32.9 in adult 01/05/2020     Current Outpatient Medications on File Prior to Visit  Medication Sig Dispense Refill  . amLODipine (NORVASC) 10 MG tablet TAKE 1 TABLET BY MOUTH ONCE A DAY (Patient not taking: Reported on 11/15/2020) 90 tablet 3  . aspirin 81 MG EC tablet TAKE 1 TABLET (81 MG TOTAL) BY MOUTH DAILY. SWALLOW WHOLE. 100 tablet 1  . aspirin EC 81 MG tablet Take 1 tablet (81 mg  total) by mouth daily. Swallow whole. 100 tablet 1  . Fluticasone-Salmeterol (ADVAIR) 250-50 MCG/DOSE AEPB INHALE 1 PUFF INTO THE LUNGS 2 (TWO) TIMES DAILY. 60 each 6  . nitroGLYCERIN (NITROSTAT) 0.6 MG SL tablet Place 0.6 mg under the tongue every 5 (five) minutes as needed for chest pain.     No current facility-administered medications on file prior to visit.    Allergies  Allergen Reactions  . Penicillins Anaphylaxis    ALL  . Tomato Swelling    Social History   Socioeconomic  History  . Marital status: Unknown    Spouse name: Not on file  . Number of children: 3  . Years of education: Not on file  . Highest education level: Not on file  Occupational History  . Not on file  Tobacco Use  . Smoking status: Current Some Day Smoker    Packs/day: 0.50    Years: 31.00    Pack years: 15.50    Types: Cigars, E-cigarettes  . Smokeless tobacco: Never Used  Vaping Use  . Vaping Use: Never used  Substance and Sexual Activity  . Alcohol use: Yes    Comment: occasioanlly   . Drug use: Never  . Sexual activity: Yes    Birth control/protection: None  Other Topics Concern  . Not on file  Social History Narrative  . Not on file   Social Determinants of Health   Financial Resource Strain: Not on file  Food Insecurity: Not on file  Transportation Needs: Not on file  Physical Activity: Not on file  Stress: Not on file  Social Connections: Not on file  Intimate Partner Violence: Not on file    Family History  Problem Relation Age of Onset  . Diabetes Mother   . Hyperlipidemia Mother   . Diabetes Sister   . Hyperlipidemia Sister   . Hearing loss Sister   . Diabetes Brother   . Hyperlipidemia Brother     Past Surgical History:  Procedure Laterality Date  . CARDIAC CATHETERIZATION  2010    ROS: Review of Systems Negative except as stated above  PHYSICAL EXAM: BP (!) 191/128   Pulse 76   Resp 16   Wt 222 lb (100.7 kg)   SpO2 97%   BMI 32.78 kg/m   Physical Exam  General appearance - alert, well appearing, and in no distress Mental status - normal mood, behavior, speech, dress, motor activity, and thought processes Chest - clear to auscultation, no wheezes, rales or rhonchi, symmetric air entry Heart - normal rate, regular rhythm, normal S1, S2, no murmurs, rubs, clicks or gallops Extremities - peripheral pulses normal, no pedal edema, no clubbing or cyanosis Dorsalis pedis and posterior tibialis pulses are 3+ bilaterally.  Feet are warm.   Toenails are overgrown and discolored. CMP Latest Ref Rng & Units 08/11/2020 08/10/2020 08/09/2020  Glucose 70 - 99 mg/dL 104(H) 91 112(H)  BUN 6 - 20 mg/dL 27(H) 25(H) 33(H)  Creatinine 0.61 - 1.24 mg/dL 2.61(H) 2.27(H) 2.67(H)  Sodium 135 - 145 mmol/L 135 135 137  Potassium 3.5 - 5.1 mmol/L 3.9 3.7 3.7  Chloride 98 - 111 mmol/L 105 103 105  CO2 22 - 32 mmol/L 20(L) 21(L) 24  Calcium 8.9 - 10.3 mg/dL 8.9 9.2 8.8(L)  Total Protein 6.0 - 8.5 g/dL - - -  Total Bilirubin 0.0 - 1.2 mg/dL - - -  Alkaline Phos 48 - 121 IU/L - - -  AST 0 - 40 IU/L - - -  ALT  0 - 44 IU/L - - -   Lipid Panel     Component Value Date/Time   CHOL 137 08/09/2020 0242   CHOL 181 01/05/2020 1515   TRIG 110 08/09/2020 0242   HDL 44 08/09/2020 0242   HDL 41 01/05/2020 1515   CHOLHDL 3.1 08/09/2020 0242   VLDL 22 08/09/2020 0242   LDLCALC 71 08/09/2020 0242   LDLCALC 111 (H) 01/05/2020 1515    CBC    Component Value Date/Time   WBC 5.8 08/11/2020 0251   RBC 5.05 08/11/2020 0251   HGB 12.4 (L) 08/11/2020 0251   HGB 12.6 (L) 01/05/2020 1515   HCT 36.2 (L) 08/11/2020 0251   HCT 41.7 01/05/2020 1515   PLT 176 08/11/2020 0251   PLT 190 01/05/2020 1515   MCV 71.7 (L) 08/11/2020 0251   MCV 78 (L) 01/05/2020 1515   MCH 24.6 (L) 08/11/2020 0251   MCHC 34.3 08/11/2020 0251   RDW 15.9 (H) 08/11/2020 0251   RDW 16.6 (H) 01/05/2020 1515   LYMPHSABS 2.4 08/08/2020 0600   MONOABS 0.5 08/08/2020 0600   EOSABS 0.1 08/08/2020 0600   BASOSABS 0.0 08/08/2020 0600    ASSESSMENT AND PLAN: 1. Essential hypertension Not at goal.  Continue Norvasc.  He is agreeable for [place] to restart the carvedilol but at a lower dose.  Stressed the importance of better blood pressure control to help preserve kidney function and prevent acute cardiovascular events - carvedilol (COREG) 3.125 MG tablet; Take 1 tablet (3.125 mg total) by mouth 2 (two) times daily with a meal.  Dispense: 60 tablet; Refill: 6  2. Coronary artery disease of  native artery of native heart with stable angina pectoris [facility]) Patient agreeable to restarting atorvastatin and low-dose carvedilol.\ Commended him on smoking cessation.  Encouraged him to remain tobacco free. - atorvastatin (LIPITOR) 40 MG tablet; Take 1 tablet (40 mg total) by mouth daily.  Dispense: 30 tablet; Refill: 3 - carvedilol (COREG) 3.125 MG tablet; Take 1 tablet (3.125 mg total) by mouth 2 (two) times daily with a meal.  Dispense: 60 tablet; Refill: 6  3. Type 2 diabetes mellitus with other circulatory complication, without long-term current use of insulin (HCC) We will plan to check A1c on his next visit. Discussed and encourage healthy eating habits. - Ambulatory referral to Ophthalmology  4. Right adrenal mass (HCC) This needs to be worked up with further imaging and checking hormones for over secretion I will follow up with Dr. Candiss Norse to see what the results were of the aldosterone and renin level and whether he had submitted the referral to the endocrinologist as was mentioned in his note  5. Former smoker Commended him on quitting.  Encouraged him to remain tobacco free - atorvastatin (LIPITOR) 40 MG tablet; Take 1 tablet (40 mg total) by mouth daily.  Dispense: 30 tablet; Refill: 3  6. Overgrown nail - Ambulatory referral to Podiatry  7.  CKD stage IIIb Discussed the importance of better blood pressure control to preserve kidney function.   Patient was given the opportunity to ask questions.  Patient verbalized understanding of the plan and was able to repeat key elements of the plan.   Orders Placed This Encounter  Procedures  . Tdap vaccine greater than or equal to 7yo IM  . Ambulatory referral to Podiatry  . Ambulatory referral to Ophthalmology     Requested Prescriptions   Signed Prescriptions Disp Refills  . atorvastatin (LIPITOR) 40 MG tablet 30 tablet 3  Sig: Take 1 tablet (40 mg total) by mouth daily.  . carvedilol (COREG) 3.125 MG tablet 60  tablet 6    Sig: Take 1 tablet (3.125 mg total) by mouth 2 (two) times daily with a meal.    Return in about 3 months (around 02/27/2021) for Give appt with clinical pharmacist in 2 wks.  Karle Plumber, MD, FACP

## 2020-11-30 ENCOUNTER — Telehealth: Payer: Self-pay | Admitting: Internal Medicine

## 2020-11-30 NOTE — Telephone Encounter (Addendum)
I spoke with Dr. Candiss Norse today regarding this patient.  I informed him that I saw the patient recently and I made reference to his note about the adrenal mass and his plan to refer to endocrinology and do an imaging study with contrast.  I was calling to see whether the referral was submitted and whether he plans to do imaging study with contrast given the patient's GFR.  He told me that he will take a look at the patient's chart and will check with his office to see if the referral was submitted to the endocrinologist and will send me a message in epic.  Addendum: I heard back from Dr. Candiss Norse today.  He states that the referral to endocrinology was placed.  He will be seeing him back in 6 weeks and recheck blood test for GFR.  If kidney function is stable, order will be placed for CT adrenal protocol at that time.

## 2020-12-05 ENCOUNTER — Other Ambulatory Visit: Payer: Self-pay

## 2020-12-20 ENCOUNTER — Ambulatory Visit (INDEPENDENT_AMBULATORY_CARE_PROVIDER_SITE_OTHER): Payer: Medicaid Other | Admitting: Podiatry

## 2020-12-20 ENCOUNTER — Other Ambulatory Visit: Payer: Self-pay

## 2020-12-20 DIAGNOSIS — B351 Tinea unguium: Secondary | ICD-10-CM

## 2020-12-20 NOTE — Progress Notes (Signed)
  Subjective:  Patient ID: George Day, male    DOB: [birth year],  MRN: [id number]  Chief Complaint  Patient presents with  . Nail Problem    Thick/painful toenails   45 y.o. male presents with the above complaint. History confirmed with patient.   Objective:  Physical Exam: warm, good capillary refill, nail exam onychomycosis of the toenails, no trophic changes or ulcerative lesions. DP pulses palpable, PT pulses palpable and protective sensation intact  No images are attached to the encounter.  Assessment:   1. Onychomycosis    Plan:  Patient was evaluated and treated and all questions answered.  Onychomycosis -Nails palliatively debrided secondary to pain   Procedure: Nail Debridement Type of Debridement: manual, sharp debridement. Instrumentation: Nail nipper, rotary burr. Number of Nails: 10  Return if symptoms worsen or fail to improve.

## 2020-12-22 ENCOUNTER — Other Ambulatory Visit: Payer: Self-pay

## 2021-01-17 ENCOUNTER — Ambulatory Visit: Payer: Medicaid Other | Admitting: Cardiovascular Disease

## 2021-01-17 NOTE — Progress Notes (Deleted)
Advanced Hypertension Clinic Initial Assessment:    Date:  01/17/2021   ID:  George Day, DOB 25-Jan-1976, MRN [id number]  PCP:  Ladell Pier, MD  Cardiologist:  Quay Burow, MD  Nephrologist:  Referring MD: Lorretta Harp, MD   CC: Hypertension  History of Present Illness:    George Day is a 45 y.o. male with a hx of essential hypertension, CKD 3, tobacco abuse, prior stroke, adrenal nodule, DM, CAD s/p MI here to establish care in the Advanced Hypertension Clinic.    Previous antihypertensives:  Secondary Causes of Hypertension  Medications/Herbal: OCP, steroids, stimulants, antidepressants, weight loss medication, immune suppressants, NSAIDs, sympathomimetics, alcohol, caffeine, licorice, ginseng, St. John's wort, chemo  Sleep Apnea Renal artery stenosis Hyperaldosteronism Hyper/hypothyroidism Pheochromocytoma: palpitations, tachycardia, headache, diaphoresis (plasma metanephrines) Cushing's syndrome: Cushingoid facies, central obesity, proximal muscle weakness, and ecchymoses, adrenal incidentaloma (cortisol) Coarctation of the aorta  Past Medical History:  Diagnosis Date  . Anemia 1998  . Anginal pain ([place])   . Anxiety   . Asthma   . Coronary artery disease   . Diabetes mellitus without complication ([place]) 9937  . Hypertension 2019  . MI (myocardial infarction) ([place]) 2010  . Stroke [facility]) 2010    Past Surgical History:  Procedure Laterality Date  . CARDIAC CATHETERIZATION  2010    Current Medications: No outpatient medications have been marked as taking for the 01/17/21 encounter (Appointment) with Skeet Latch, MD.     Allergies:   Penicillins and Tomato   Social History   Socioeconomic History  . Marital status: Unknown    Spouse name: Not on file  . Number of children: 3  . Years of education: Not on file  . Highest education level: Not on file  Occupational History  . Not on file  Tobacco Use  . Smoking status: Current  Some Day Smoker    Packs/day: 0.50    Years: 31.00    Pack years: 15.50    Types: Cigars, E-cigarettes  . Smokeless tobacco: Never Used  Vaping Use  . Vaping Use: Never used  Substance and Sexual Activity  . Alcohol use: Yes    Comment: occasioanlly   . Drug use: Never  . Sexual activity: Yes    Birth control/protection: None  Other Topics Concern  . Not on file  Social History Narrative  . Not on file   Social Determinants of Health   Financial Resource Strain: Not on file  Food Insecurity: Not on file  Transportation Needs: Not on file  Physical Activity: Not on file  Stress: Not on file  Social Connections: Not on file     Family History: The patient's ***family history includes Diabetes in his brother, mother, and sister; Hearing loss in his sister; Hyperlipidemia in his brother, mother, and sister.  ROS:   Please see the history of present illness.    *** All other systems reviewed and are negative.  EKGs/Labs/Other Studies Reviewed:    EKG:  EKG is *** ordered today.  The ekg ordered today demonstrates ***  Recent Labs: 08/08/2020: B Natriuretic Peptide 134.8 08/11/2020: BUN 27; Creatinine, Ser 2.61; Hemoglobin 12.4; Platelets 176; Potassium 3.9; Sodium 135   Recent Lipid Panel    Component Value Date/Time   CHOL 137 08/09/2020 0242   CHOL 181 01/05/2020 1515   TRIG 110 08/09/2020 0242   HDL 44 08/09/2020 0242   HDL 41 01/05/2020 1515   CHOLHDL 3.1 08/09/2020 0242   VLDL 22 08/09/2020 0242  LDLCALC 71 08/09/2020 0242   LDLCALC 111 (H) 01/05/2020 1515    Physical Exam:   VS:  There were no vitals taken for this visit. , BMI There is no height or weight on file to calculate BMI. GENERAL:  Well appearing HEENT: Pupils equal round and reactive, fundi not visualized, oral mucosa unremarkable NECK:  No jugular venous distention, waveform within normal limits, carotid upstroke brisk and symmetric, no bruits, no thyromegaly LYMPHATICS:  No cervical  adenopathy LUNGS:  Clear to auscultation bilaterally HEART:  RRR.  PMI not displaced or sustained,S1 and S2 within normal limits, no S3, no S4, no clicks, no rubs, *** murmurs ABD:  Flat, positive bowel sounds normal in frequency in pitch, no bruits, no rebound, no guarding, no midline pulsatile mass, no hepatomegaly, no splenomegaly EXT:  2 plus pulses throughout, no edema, no cyanosis no clubbing SKIN:  No rashes no nodules NEURO:  Cranial nerves II through XII grossly intact, motor grossly intact throughout PSYCH:  Cognitively intact, oriented to person place and time   ASSESSMENT/PLAN:    No problem-specific Assessment & Plan notes found for this encounter.   Screening for Secondary Hypertension: { Click here to document screening for secondary causes of HTN  :[id number]}    Relevant Labs/Studies: Basic Labs Latest Ref Rng & Units 08/11/2020 08/10/2020 08/09/2020  Sodium 135 - 145 mmol/L 135 135 137  Potassium 3.5 - 5.1 mmol/L 3.9 3.7 3.7  Creatinine 0.61 - 1.24 mg/dL 2.61(H) 2.27(H) 2.67(H)                Renovascular  08/08/2020  Renal Artery [place] Completed Yes        he consents to be monitored in our remote patient monitoring program through [place].  he will track his blood pressure twice daily and understands that these trends will help [place] to adjust his medications as needed prior to his next appointment.  he *** interested in enrolling in the PREP exercise and nutrition program through the [facility].     Disposition:    FU with MD/PharmD in {gen number [id number]} {Days to years:10300}    Medication Adjustments/Labs and Tests Ordered: Current medicines are reviewed at length with the patient today.  Concerns regarding medicines are outlined above.  No orders of the defined types were placed in this encounter.  No orders of the defined types were placed in this encounter.    Signed, Skeet Latch, MD  01/17/2021 1:24 PM    [facility]  [facility]

## 2021-02-15 ENCOUNTER — Other Ambulatory Visit: Payer: Self-pay

## 2021-02-15 ENCOUNTER — Encounter: Payer: Self-pay | Admitting: Internal Medicine

## 2021-02-15 ENCOUNTER — Ambulatory Visit (INDEPENDENT_AMBULATORY_CARE_PROVIDER_SITE_OTHER): Payer: Medicaid Other | Admitting: Internal Medicine

## 2021-02-15 VITALS — BP 205/100 | HR 89 | Ht 69.0 in | Wt 215.0 lb

## 2021-02-15 DIAGNOSIS — E278 Other specified disorders of adrenal gland: Secondary | ICD-10-CM

## 2021-02-15 DIAGNOSIS — I10 Essential (primary) hypertension: Secondary | ICD-10-CM | POA: Diagnosis not present

## 2021-02-15 DIAGNOSIS — E876 Hypokalemia: Secondary | ICD-10-CM | POA: Diagnosis not present

## 2021-02-15 LAB — BASIC METABOLIC PANEL
BUN: 29 mg/dL — ABNORMAL HIGH (ref 6–23)
CO2: 30 mEq/L (ref 19–32)
Calcium: 9.3 mg/dL (ref 8.4–10.5)
Chloride: 101 mEq/L (ref 96–112)
Creatinine, Ser: 2.9 mg/dL — ABNORMAL HIGH (ref 0.40–1.50)
GFR: 25.35 mL/min — ABNORMAL LOW (ref >60.00)
Glucose, Bld: 84 mg/dL (ref 70–99)
Potassium: 3.2 mEq/L — ABNORMAL LOW (ref 3.5–5.1)
Sodium: 137 mEq/L (ref 135–145)

## 2021-02-15 NOTE — Patient Instructions (Addendum)
-   Continue Amlodipine 10 mg daily  - Restart Hydralazine 100 mg Twice daily for 3 days, then increase to three times a da   HOW TO TREAT LOW BLOOD SUGARS (Blood sugar LESS THAN 70 MG/DL) Please follow the RULE OF 15 for the treatment of hypoglycemia treatment (when your (blood sugars are less than 70 mg/dL)   STEP 1: Take 15 grams of carbohydrates when your blood sugar is low, which includes:  3-4 GLUCOSE TABS  OR 3-4 OZ OF JUICE OR REGULAR SODA OR ONE TUBE OF GLUCOSE GEL    STEP 2: RECHECK blood sugar in 15 MINUTES STEP 3: If your blood sugar is still low at the 15 minute recheck --> then, go back to STEP 1 and treat AGAIN with another 15 grams of carbohydrates.

## 2021-02-15 NOTE — Progress Notes (Signed)
Name: George Day  MRN/ DOB: [id number], 03-May-1976    Age/ Sex: 45 y.o., male    PCP: Ladell Pier, MD   Reason for Endocrinology Evaluation: Adrenal mass      Date of Initial Endocrinology Evaluation: 02/15/2021     HPI: [name] is a 45 y.o. male with a past medical history of HTN, CKD, T2DM, CAD, PVD and Hx of CVA. The patient presented for initial endocrinology clinic visit on 02/15/2021 for consultative assistance with his Adrenal mass .   He was noted to have a 7.4 cm right adrenal mass on non-contrast CT.    Adrenal incidentaloma: Substantial weight gain- no, weight fluctuates  Centripetal obesity- no Easy bruisability- yes Severe hypertension- HTN dx at age 54 DM-  yes , diet controlled  Proximal muscle weakness- yes  Fatigue- yes Sudden/ severe headaches- yes  Weight loss- no  Anxiety attacks- rare Sweating- yes Cardiac arrhythmias -no Palpitations- yes Fluid retention- yes , right leg  Hypokalemia- no    He has been on amlodipine ONLY since 09/2020 Has rare abdominal pain  Has constipation  No prior radiation exposure  of cancer          HISTORY:  Past Medical History:  Past Medical History:  Diagnosis Date  . Anemia 1998  . Anginal pain ([place])   . Anxiety   . Asthma   . Coronary artery disease   . Diabetes mellitus without complication ([place]) 9381  . Hypertension 2019  . MI (myocardial infarction) ([place]) 2010  . Stroke [facility]) 2010   Past Surgical History:  Past Surgical History:  Procedure Laterality Date  . CARDIAC CATHETERIZATION  2010    Social History:  reports that he has been smoking cigars, e-cigarettes, and cigarettes. He has a 15.50 pack-year smoking history. He has never used smokeless tobacco. He reports current alcohol use. He reports that he does not use drugs. Family History: family history includes Diabetes in his brother, mother, and sister; Hearing loss in his sister; Hyperlipidemia in his brother, mother,  and sister.   HOME MEDICATIONS: Allergies as of 02/15/2021       Reactions   Penicillins Anaphylaxis   ALL   Tomato Swelling        Medication List        Accurate as of February 15, 2021  2:36 PM. If you have any questions, ask your nurse or doctor.          amLODipine 10 MG tablet Commonly known as: NORVASC TAKE 1 TABLET BY MOUTH ONCE A DAY   aspirin EC 81 MG tablet Take 1 tablet (81 mg total) by mouth daily. Swallow whole. What changed: Another medication with the same name was removed. Continue taking this medication, and follow the directions you see here. Changed by: Dorita Sciara, MD   atorvastatin 40 MG tablet Commonly known as: LIPITOR Take 1 tablet (40 mg total) by mouth daily.   carvedilol 3.125 MG tablet Commonly known as: COREG Take 1 tablet (3.125 mg total) by mouth 2 (two) times daily with a meal.   fluticasone-salmeterol 250-50 MCG/ACT Aepb Commonly known as: ADVAIR Inhale 1 puff into the lungs 2 (two) times daily.   nitroGLYCERIN 0.6 MG SL tablet Commonly known as: NITROSTAT Place 0.6 mg under the tongue every 5 (five) minutes as needed for chest pain.          REVIEW OF SYSTEMS: A comprehensive ROS was conducted with the patient and is negative except  as per HPI     OBJECTIVE:  VS: BP (!) 205/100   Pulse 89   Ht 5\' 9"  (1.753 m)   Wt 215 lb (97.5 kg)   SpO2 99%   BMI 31.75 kg/m    Wt Readings from Last 3 Encounters:  02/15/21 215 lb (97.5 kg)  11/28/20 222 lb (100.7 kg)  11/15/20 224 lb (101.6 kg)     EXAM: General: Pt appears well and is in NAD  Hydration: Well-hydrated with moist mucous membranes and good skin turgor  Eyes: External eye exam normal without stare, lid lag or exophthalmos.  EOM intact.  PERRL.  Ears, Nose, Throat: Hearing: Grossly intact bilaterally Dental: Good dentition  Throat: Clear without mass, erythema or exudate  Neck: General: Supple without adenopathy. Thyroid: Thyroid size normal.  No  goiter or nodules appreciated. No thyroid bruit.  Lungs: Clear with good BS bilat with no rales, rhonchi, or wheezes  Heart: Auscultation: RRR.  Abdomen: Normoactive bowel sounds, soft, nontender, without masses or organomegaly palpable  Extremities:  BL LE: Trace  pretibial edema , R>L   Mental Status: Judgment, insight: Intact Orientation: Oriented to time, place, and person Memory: Intact for recent and remote events Mood and affect: No depression, anxiety, or agitation     DATA REVIEWED:   Results for GERALDINE, TESAR (MRN [id number]) as of 02/16/2021 11:04  Ref. Range 02/15/2021 14:58  Sodium Latest Ref Range: 135 - 145 mEq/L 137  Potassium Latest Ref Range: 3.5 - 5.1 mEq/L 3.2 (L)  Chloride Latest Ref Range: 96 - 112 mEq/L 101  CO2 Latest Ref Range: 19 - 32 mEq/L 30  Glucose Latest Ref Range: 70 - 99 mg/dL 84  BUN Latest Ref Range: 6 - 23 mg/dL 29 (H)  Creatinine Latest Ref Range: 0.40 - 1.50 mg/dL 2.90 (H)  Calcium Latest Ref Range: 8.4 - 10.5 mg/dL 9.3  GFR Latest Ref Range: >60.00 mL/min 25.35 (L)  ALDOSTERONE Unknown WILL FOLLOW  DHEA-SO4 Latest Ref Range: 71.6 - 375.4 ug/dL 86.1  Renin Unknown WILL FOLLOW  ALDOS/RENIN RATIO Unknown WILL FOLLOW    ASSESSMENT/PLAN/RECOMMENDATIONS:   Right adrenal Adenoma :  - Eighty five percent of adrenal adenomas are nonsecretory.  - Three forms of adrenal hyperfunction should be considered in patients with adrenal incidentaloma  Glucocorticoid hypersecretion Primary hyperaldosteronism Pheochromocytoma   -We are going to proceed with BMP, Aldo: Renin, DHEA-S today we will also proceed with 24-hour urine collection for catecholamines, metanephrines, and cortisol  -I have explained to the patient that given the size of adrenal mass > 4 cm, the risk of cancer is higher and I have recommended surgical resection  -Patient in agreement of this and a referral has been placed to Dr. Fredirick Maudlin pending endocrinology work-up   2.   HTN:  -In review of his chart it appears that he has had inconsistent intake of antihypertensives, he saw his nephrologist in February and he was off all antihypertensive medications and per the note he has declined to restart all of them at the same time.  He was started on amlodipine with the understanding that he was going to follow-up with the HTN clinic, the patient tells me he has not had any follow-up with Dr. Oval Linsey at the HTN clinic.  His BP is out of control today, he is asymptomatic  I am going to restart his hydralazine as below Patient advised to contact the aging clinic and schedule a follow-up appointment this week, patient expressed understanding   Medications  Continue amlodipine 10 mg daily Restart hydralazine 100 mg twice daily, if after 3 days BP > 160/90 we will increase to 3 times daily   3.Hypokalemia:  -Potassium 3.35M EQ/L, but with GFR of 25 I would not replenish at this time but we will continue to monitor -We will replenish if less than 3   Follow-up in 3 months  Signed electronically by: Mack Guise, MD  [facility]  [address] [place], [place] [address] Phone: [phone] FAX: [phone]   CC: [address]ell Pier, [place] [address] Phone: [phone] Fax: [phone]   Return to Endocrinology clinic as below: Future Appointments  Date Time Provider [place]  04/25/2021  2:00 PM Skeet Latch, MD [facility]

## 2021-02-16 DIAGNOSIS — I10 Essential (primary) hypertension: Secondary | ICD-10-CM | POA: Insufficient documentation

## 2021-02-19 ENCOUNTER — Observation Stay (HOSPITAL_COMMUNITY): Payer: Self-pay

## 2021-02-19 ENCOUNTER — Emergency Department (HOSPITAL_COMMUNITY): Payer: Self-pay

## 2021-02-19 ENCOUNTER — Inpatient Hospital Stay (HOSPITAL_COMMUNITY)
Admission: EM | Admit: 2021-02-19 | Discharge: 2021-02-25 | DRG: 305 | Disposition: A | Discharge status: Home / Self Care | Payer: Medicaid Other | Attending: Internal Medicine | Admitting: Internal Medicine

## 2021-02-19 DIAGNOSIS — N186 End stage renal disease: Secondary | ICD-10-CM | POA: Diagnosis present

## 2021-02-19 DIAGNOSIS — Z880 Allergy status to penicillin: Secondary | ICD-10-CM

## 2021-02-19 DIAGNOSIS — E1122 Type 2 diabetes mellitus with diabetic chronic kidney disease: Secondary | ICD-10-CM | POA: Diagnosis present

## 2021-02-19 DIAGNOSIS — D631 Anemia in chronic kidney disease: Secondary | ICD-10-CM | POA: Diagnosis present

## 2021-02-19 DIAGNOSIS — N189 Chronic kidney disease, unspecified: Secondary | ICD-10-CM | POA: Insufficient documentation

## 2021-02-19 DIAGNOSIS — I130 Hypertensive heart and chronic kidney disease with heart failure and stage 1 through stage 4 chronic kidney disease, or unspecified chronic kidney disease: Secondary | ICD-10-CM | POA: Diagnosis present

## 2021-02-19 DIAGNOSIS — I161 Hypertensive emergency: Principal | ICD-10-CM

## 2021-02-19 DIAGNOSIS — G35 Multiple sclerosis: Secondary | ICD-10-CM | POA: Diagnosis present

## 2021-02-19 DIAGNOSIS — I639 Cerebral infarction, unspecified: Secondary | ICD-10-CM

## 2021-02-19 DIAGNOSIS — Z20822 Contact with and (suspected) exposure to covid-19: Secondary | ICD-10-CM | POA: Diagnosis present

## 2021-02-19 DIAGNOSIS — Z7151 Drug abuse counseling and surveillance of drug abuser: Secondary | ICD-10-CM

## 2021-02-19 DIAGNOSIS — N179 Acute kidney failure, unspecified: Secondary | ICD-10-CM

## 2021-02-19 DIAGNOSIS — Z7982 Long term (current) use of aspirin: Secondary | ICD-10-CM

## 2021-02-19 DIAGNOSIS — I252 Old myocardial infarction: Secondary | ICD-10-CM

## 2021-02-19 DIAGNOSIS — Z79899 Other long term (current) drug therapy: Secondary | ICD-10-CM

## 2021-02-19 DIAGNOSIS — E279 Disorder of adrenal gland, unspecified: Secondary | ICD-10-CM | POA: Diagnosis present

## 2021-02-19 DIAGNOSIS — Z8673 Personal history of transient ischemic attack (TIA), and cerebral infarction without residual deficits: Secondary | ICD-10-CM

## 2021-02-19 DIAGNOSIS — I5032 Chronic diastolic (congestive) heart failure: Secondary | ICD-10-CM | POA: Diagnosis present

## 2021-02-19 DIAGNOSIS — Z716 Tobacco abuse counseling: Secondary | ICD-10-CM

## 2021-02-19 DIAGNOSIS — N1831 Chronic kidney disease, stage 3a: Secondary | ICD-10-CM | POA: Diagnosis present

## 2021-02-19 DIAGNOSIS — E872 Acidosis: Secondary | ICD-10-CM | POA: Diagnosis present

## 2021-02-19 DIAGNOSIS — E871 Hypo-osmolality and hyponatremia: Secondary | ICD-10-CM | POA: Diagnosis present

## 2021-02-19 DIAGNOSIS — E278 Other specified disorders of adrenal gland: Secondary | ICD-10-CM | POA: Diagnosis present

## 2021-02-19 DIAGNOSIS — Z833 Family history of diabetes mellitus: Secondary | ICD-10-CM

## 2021-02-19 DIAGNOSIS — Z9114 Patient's other noncompliance with medication regimen: Secondary | ICD-10-CM

## 2021-02-19 DIAGNOSIS — R809 Proteinuria, unspecified: Secondary | ICD-10-CM | POA: Diagnosis present

## 2021-02-19 DIAGNOSIS — Z91018 Allergy to other foods: Secondary | ICD-10-CM

## 2021-02-19 DIAGNOSIS — F419 Anxiety disorder, unspecified: Secondary | ICD-10-CM | POA: Diagnosis present

## 2021-02-19 DIAGNOSIS — I2510 Atherosclerotic heart disease of native coronary artery without angina pectoris: Secondary | ICD-10-CM | POA: Diagnosis present

## 2021-02-19 DIAGNOSIS — N185 Chronic kidney disease, stage 5: Secondary | ICD-10-CM | POA: Diagnosis present

## 2021-02-19 DIAGNOSIS — N1832 Chronic kidney disease, stage 3b: Secondary | ICD-10-CM | POA: Diagnosis present

## 2021-02-19 DIAGNOSIS — G35D Multiple sclerosis, unspecified: Secondary | ICD-10-CM

## 2021-02-19 DIAGNOSIS — Z7951 Long term (current) use of inhaled steroids: Secondary | ICD-10-CM

## 2021-02-19 DIAGNOSIS — F17290 Nicotine dependence, other tobacco product, uncomplicated: Secondary | ICD-10-CM | POA: Diagnosis present

## 2021-02-19 DIAGNOSIS — E876 Hypokalemia: Secondary | ICD-10-CM

## 2021-02-19 DIAGNOSIS — E119 Type 2 diabetes mellitus without complications: Secondary | ICD-10-CM

## 2021-02-19 DIAGNOSIS — I169 Hypertensive crisis, unspecified: Secondary | ICD-10-CM | POA: Diagnosis present

## 2021-02-19 DIAGNOSIS — N184 Chronic kidney disease, stage 4 (severe): Secondary | ICD-10-CM | POA: Diagnosis present

## 2021-02-19 DIAGNOSIS — I160 Hypertensive urgency: Secondary | ICD-10-CM | POA: Diagnosis present

## 2021-02-19 LAB — ETHANOL: Alcohol, Ethyl (B): <10 mg/dL (ref <10)

## 2021-02-19 LAB — COMPREHENSIVE METABOLIC PANEL
ALT: 27 U/L (ref 0–44)
AST: 32 U/L (ref 15–41)
Albumin: 3.4 g/dL — ABNORMAL LOW (ref 3.5–5.0)
Alkaline Phosphatase: 77 U/L (ref 38–126)
Anion gap: 8 (ref 5–15)
BUN: 27 mg/dL — ABNORMAL HIGH (ref 6–20)
CO2: 23 mmol/L (ref 22–32)
Calcium: 9 mg/dL (ref 8.9–10.3)
Chloride: 105 mmol/L (ref 98–111)
Creatinine, Ser: 2.93 mg/dL — ABNORMAL HIGH (ref 0.61–1.24)
GFR, Estimated: 26 mL/min — ABNORMAL LOW (ref >60)
Glucose, Bld: 115 mg/dL — ABNORMAL HIGH (ref 70–99)
Potassium: 3 mmol/L — ABNORMAL LOW (ref 3.5–5.1)
Sodium: 136 mmol/L (ref 135–145)
Total Bilirubin: 1.1 mg/dL (ref 0.3–1.2)
Total Protein: 7.5 g/dL (ref 6.5–8.1)

## 2021-02-19 LAB — HEMOGLOBIN A1C
Hgb A1c MFr Bld: 5.9 % — ABNORMAL HIGH (ref 4.8–5.6)
Mean Plasma Glucose: 122.63 mg/dL

## 2021-02-19 LAB — I-STAT CHEM 8, ED
BUN: 28 mg/dL — ABNORMAL HIGH (ref 6–20)
Calcium, Ion: 1.11 mmol/L — ABNORMAL LOW (ref 1.15–1.40)
Chloride: 104 mmol/L (ref 98–111)
Creatinine, Ser: 3.2 mg/dL — ABNORMAL HIGH (ref 0.61–1.24)
Glucose, Bld: 112 mg/dL — ABNORMAL HIGH (ref 70–99)
HCT: 40 % (ref 39.0–52.0)
Hemoglobin: 13.6 g/dL (ref 13.0–17.0)
Potassium: 3 mmol/L — ABNORMAL LOW (ref 3.5–5.1)
Sodium: 140 mmol/L (ref 135–145)
TCO2: 23 mmol/L (ref 22–32)

## 2021-02-19 LAB — RESP PANEL BY RT-PCR (FLU A&B, COVID) ARPGX2
Influenza A by PCR: NEGATIVE
Influenza B by PCR: NEGATIVE
SARS Coronavirus 2 by RT PCR: NEGATIVE

## 2021-02-19 LAB — DIFFERENTIAL
Abs Immature Granulocytes: 0.01 10*3/uL (ref 0.00–0.07)
Basophils Absolute: 0 10*3/uL (ref 0.0–0.1)
Basophils Relative: 1 %
Eosinophils Absolute: 0.1 10*3/uL (ref 0.0–0.5)
Eosinophils Relative: 2 %
Immature Granulocytes: 0 %
Lymphocytes Relative: 24 %
Lymphs Abs: 1.6 10*3/uL (ref 0.7–4.0)
Monocytes Absolute: 0.6 10*3/uL (ref 0.1–1.0)
Monocytes Relative: 9 %
Neutro Abs: 4.2 10*3/uL (ref 1.7–7.7)
Neutrophils Relative %: 64 %

## 2021-02-19 LAB — RAPID URINE DRUG SCREEN, HOSP PERFORMED
Amphetamines: NOT DETECTED
Barbiturates: NOT DETECTED
Benzodiazepines: NOT DETECTED
Cocaine: NOT DETECTED
Opiates: NOT DETECTED
Tetrahydrocannabinol: POSITIVE — AB

## 2021-02-19 LAB — URINALYSIS, ROUTINE W REFLEX MICROSCOPIC
Bacteria, UA: NONE SEEN
Bilirubin Urine: NEGATIVE
Glucose, UA: 50 mg/dL — AB
Ketones, ur: NEGATIVE mg/dL
Leukocytes,Ua: NEGATIVE
Nitrite: NEGATIVE
Protein, ur: 100 mg/dL — AB
Specific Gravity, Urine: 1.009 (ref 1.005–1.030)
pH: 8 (ref 5.0–8.0)

## 2021-02-19 LAB — TROPONIN I (HIGH SENSITIVITY)
Troponin I (High Sensitivity): 49 ng/L — ABNORMAL HIGH (ref <18)
Troponin I (High Sensitivity): 49 ng/L — ABNORMAL HIGH (ref <18)

## 2021-02-19 LAB — MRSA NEXT GEN BY PCR, NASAL: MRSA by PCR Next Gen: NOT DETECTED

## 2021-02-19 LAB — CBC
HCT: 37.5 % — ABNORMAL LOW (ref 39.0–52.0)
Hemoglobin: 12.3 g/dL — ABNORMAL LOW (ref 13.0–17.0)
MCH: 23.7 pg — ABNORMAL LOW (ref 26.0–34.0)
MCHC: 32.8 g/dL (ref 30.0–36.0)
MCV: 72.4 fL — ABNORMAL LOW (ref 80.0–100.0)
Platelets: 219 10*3/uL (ref 150–400)
RBC: 5.18 MIL/uL (ref 4.22–5.81)
RDW: 15.5 % (ref 11.5–15.5)
WBC: 6.6 10*3/uL (ref 4.0–10.5)
nRBC: 0 % (ref 0.0–0.2)

## 2021-02-19 LAB — PROTIME-INR
INR: 1 (ref 0.8–1.2)
Prothrombin Time: 13 seconds (ref 11.4–15.2)

## 2021-02-19 LAB — APTT: aPTT: 28 seconds (ref 24–36)

## 2021-02-19 MED ORDER — IOHEXOL 350 MG/ML SOLN
100.0000 mL | Freq: Once | INTRAVENOUS | Status: AC | PRN
  Administered 2021-02-19: 100 mL via INTRAVENOUS

## 2021-02-19 MED ORDER — CARVEDILOL 3.125 MG PO TABS
3.1250 mg | ORAL_TABLET | Freq: Two times a day (BID) | ORAL | Status: DC
  Filled 2021-02-19: qty 1

## 2021-02-19 MED ORDER — HEPARIN SODIUM (PORCINE) 5000 UNIT/ML IJ SOLN
5000.0000 [IU] | Freq: Three times a day (TID) | INTRAMUSCULAR | Status: DC
  Administered 2021-02-19 – 2021-02-25 (×18): 5000 [IU] via SUBCUTANEOUS
  Filled 2021-02-19 (×18): qty 1

## 2021-02-19 MED ORDER — DOCUSATE SODIUM 100 MG PO CAPS: 100.0000 mg | ORAL_CAPSULE | Freq: Two times a day (BID) | ORAL | Status: DC | PRN

## 2021-02-19 MED ORDER — POTASSIUM CHLORIDE CRYS ER 20 MEQ PO TBCR
40.0000 meq | EXTENDED_RELEASE_TABLET | ORAL | Status: DC
  Administered 2021-02-19: 40 meq via ORAL
  Filled 2021-02-19: qty 2

## 2021-02-19 MED ORDER — ONDANSETRON HCL 4 MG/2ML IJ SOLN
4.0000 mg | Freq: Four times a day (QID) | INTRAMUSCULAR | Status: DC | PRN
  Administered 2021-02-20: 4 mg via INTRAVENOUS
  Filled 2021-02-19: qty 2

## 2021-02-19 MED ORDER — AMLODIPINE BESYLATE 5 MG PO TABS
5.0000 mg | ORAL_TABLET | Freq: Every day | ORAL | Status: DC
  Filled 2021-02-19: qty 1

## 2021-02-19 MED ORDER — AMLODIPINE BESYLATE 10 MG PO TABS
10.0000 mg | ORAL_TABLET | Freq: Every day | ORAL | Status: DC
  Administered 2021-02-19 – 2021-02-25 (×7): 10 mg via ORAL
  Filled 2021-02-19 (×6): qty 1

## 2021-02-19 MED ORDER — LABETALOL HCL 5 MG/ML IV SOLN
10.0000 mg | INTRAVENOUS | Status: DC | PRN
  Administered 2021-02-19 (×2): 10 mg via INTRAVENOUS
  Filled 2021-02-19 (×2): qty 4

## 2021-02-19 MED ORDER — LABETALOL HCL 5 MG/ML IV SOLN
10.0000 mg | Freq: Once | INTRAVENOUS | Status: AC
  Administered 2021-02-19: 10 mg via INTRAVENOUS
  Filled 2021-02-19: qty 4

## 2021-02-19 MED ORDER — CARVEDILOL 25 MG PO TABS
25.0000 mg | ORAL_TABLET | Freq: Two times a day (BID) | ORAL | Status: DC
  Administered 2021-02-19 – 2021-02-25 (×12): 25 mg via ORAL
  Filled 2021-02-19 (×11): qty 1

## 2021-02-19 MED ORDER — CHLORHEXIDINE GLUCONATE CLOTH 2 % EX PADS
6.0000 | MEDICATED_PAD | Freq: Every day | CUTANEOUS | Status: DC
  Administered 2021-02-19 – 2021-02-23 (×3): 6 via TOPICAL

## 2021-02-19 MED ORDER — POLYETHYLENE GLYCOL 3350 17 G PO PACK: 17.0000 g | PACK | Freq: Every day | ORAL | Status: DC | PRN

## 2021-02-19 MED ORDER — NICARDIPINE HCL IN NACL 20-0.86 MG/200ML-% IV SOLN
3.0000 mg/h | INTRAVENOUS | Status: DC
  Administered 2021-02-19 (×2): 5 mg/h via INTRAVENOUS
  Administered 2021-02-19: 7.25 mg/h via INTRAVENOUS
  Administered 2021-02-19: 7.5 mg/h via INTRAVENOUS
  Administered 2021-02-20: 4 mg/h via INTRAVENOUS
  Administered 2021-02-20: 7.5 mg/h via INTRAVENOUS
  Administered 2021-02-20: 5 mg/h via INTRAVENOUS
  Administered 2021-02-20 (×2): 3 mg/h via INTRAVENOUS
  Administered 2021-02-20: 7.5 mg/h via INTRAVENOUS
  Administered 2021-02-20: 5 mg/h via INTRAVENOUS
  Filled 2021-02-19 (×14): qty 200

## 2021-02-19 NOTE — ED Notes (Signed)
Nurse report given Lurlean Leyden, RN

## 2021-02-19 NOTE — Progress Notes (Signed)
Prior note entered as "consult" should be H&P.  Entered consult in error.    Noe Gens, MSN, APRN, NP-C, AGACNP-BC [facility] 02/19/2021, 11:44 AM   Please see [email or web address] for pager details.   From 7A-7P if no response, please call [phone] After hours, please call ELink [phone]

## 2021-02-19 NOTE — Progress Notes (Addendum)
Called to evaluate patient for admission  45 year old man with known adrenal mass presently being worked up, CAD, DM2 and HTN was seen earlier with neurologic symptoms including "I could not see, it was all gray and shadows", numbness of left side, headache and dizziness.  Blood pressures were elevated to 215/126.  Patient was treated with labetalol 10 mg IV x2.  Work-up for stroke including MRI was negative.  When I went to see patient, he noted he felt better, vision was improving but not entirely improved.  Numbness and tingling was also improved but also not resolved.  Blood pressure when I was in the room was 220/145.  No chest pain.  Headache had resolved.  I am concerned patient is having a hypertensive emergency with endorgan damage/compromise including brain and kidney.  I have called critical care for possible admission to ICU for initiation of nicardipine drip given diastolic of 818 and persistence of symptoms in the setting of known adrenal mass, cannot rule out pheochromocytoma.Nicardipene drip started pending ICU evaluation.

## 2021-02-19 NOTE — ED Provider Notes (Signed)
Emergency Medicine Provider Triage Evaluation Note  George Day , a 45 y.o. male  was evaluated in triage.  Pt complains of HTN.  Pt has been working with his PCP to get this under control without success.  EMS reports BP 250/130 with them.  Pt also c/o dizziness and vision changes tonight.   Review of Systems  Positive: Dizziness, HTN, vision changes Negative: CP, SOB  Physical Exam  BP (!) 183/128 (BP Location: Right Arm)   Pulse 93   Temp 98.4 F (36.9 C) (Oral)   Resp 14   SpO2 98%  Gen:   Awake, no distress   Resp:  Normal effort  MSK:   Moves extremities without difficulty  Other:  Nonfocal neuro exam  Medical Decision Making  Medically screening exam initiated at 3:23 AM.  Appropriate orders placed.  George Day was informed that the remainder of the evaluation will be completed by another provider, this initial triage assessment does not replace that evaluation, and the importance of remaining in the ED until their evaluation is complete.  HTN - considering hypertensive urgency.     George Day, Gwenlyn Perking 02/19/21 0325    Orpah Greek, MD 02/19/21 [phone]

## 2021-02-19 NOTE — Consult Note (Addendum)
NEUROLOGY CONSULTATION NOTE   Date of service: February 19, 2021 Patient Name: George Day MRN:  [id number] DOB:  04/18/1976 Reason for consult: "L sided numbness" Requesting Provider: Maudie Flakes, MD _ _ _   _ __   _ __ _ _  __ __   _ __   __ _  History of Present Illness  George Day is a 45 y.o. male with PMH significant for CAD, DM2, HTN, MI, prior stroke who presents with multiple complaints that abruptly started around 2050 on 02/18/21 when he was walking out from work and to his car.  Reports he had sudden onset bilateral blurred vision, tingling in his hands and feet along with tightness in his thighs. He noted that his R arm and leg were tingly and the whole left side is numb.  He also felt pain in his back, "right where his kidney is and it goes down into his R leg".  He was noted to be hypertensive on arrival to SBP of 250s. Endorses having trouble getting his BP under control.  Endorses prior history of stroke but had no residual symptoms. 4 years ago, started having trouble with R leg, his R foot has been dragging since. Was unable to see a doctor due to scheduling issues and could not get an appointment. Feels his R leg is weaker today and feels heavy.  No recent falls, no urinary incontinence or retention, he has not had a bowel movement in 3 days now. No saddle anesthesia.   ROS   Constitutional Denies weight loss, fever. Endorses chills.   HEENT Endorses changes in vision but no hearing loss.  Respiratory Denies SOB and cough.   CV Denies palpitations and CP   GI Denies abdominal pain, nausea, vomiting and diarrhea.   GU Denies dysuria and urinary frequency.   MSK Denies myalgia but endorses joint pain.  Skin Denies rash and pruritus.   Neurological Denies headache and syncope.  Psychiatric Denies recent changes in mood. Denies anxiety and depression.   Past History   Past Medical History:  Diagnosis Date  . Anemia 1998  . Anginal pain ([place])   . Anxiety   .  Asthma   . Coronary artery disease   . Diabetes mellitus without complication ([place]) 3825  . Hypertension 2019  . MI (myocardial infarction) ([place]) 2010  . Stroke [facility]) 2010   Past Surgical History:  Procedure Laterality Date  . CARDIAC CATHETERIZATION  2010   Family History  Problem Relation Age of Onset  . Diabetes Mother   . Hyperlipidemia Mother   . Diabetes Sister   . Hyperlipidemia Sister   . Hearing loss Sister   . Diabetes Brother   . Hyperlipidemia Brother    Social History   Socioeconomic History  . Marital status: Unknown    Spouse name: Not on file  . Number of children: 3  . Years of education: Not on file  . Highest education level: Not on file  Occupational History  . Not on file  Tobacco Use  . Smoking status: Some Days    Packs/day: 0.50    Years: 31.00    Pack years: 15.50    Types: Cigars, E-cigarettes, Cigarettes  . Smokeless tobacco: Never  Vaping Use  . Vaping Use: Never used  Substance and Sexual Activity  . Alcohol use: Yes    Comment: occasioanlly   . Drug use: Never  . Sexual activity: Yes    Birth control/protection: None  Other Topics  Concern  . Not on file  Social History Narrative  . Not on file   Social Determinants of Health   Financial Resource Strain: Not on file  Food Insecurity: Not on file  Transportation Needs: Not on file  Physical Activity: Not on file  Stress: Not on file  Social Connections: Not on file   Allergies  Allergen Reactions  . Penicillins Anaphylaxis    ALL  . Tomato Swelling    Medications  (Not in a hospital admission)    Vitals   Vitals:   02/19/21 0320 02/19/21 0410 02/19/21 0430 02/19/21 0530  BP: (!) 183/128 (!) 190/120 (!) 213/126 (!) 201/123  Pulse: 93 93 89 93  Resp: 14 16 13  (!) 28  Temp: 98.4 F (36.9 C)     TempSrc: Oral     SpO2: 98% 99% 100% 95%     There is no height or weight on file to calculate BMI.  Physical Exam   General: Laying comfortably in bed; in no  acute distress. HENT: Normal oropharynx and mucosa. Normal external appearance of ears and nose.  Neck: Supple, no pain or tenderness  CV: No JVD. No peripheral edema.  Pulmonary: Symmetric Chest rise. Normal respiratory effort.  Abdomen: Soft to touch, non-tender.  Ext: No cyanosis, edema, or deformity  Skin: No rash. Normal palpation of skin.   Musculoskeletal: Normal digits and nails by inspection. No clubbing.   Neurologic Examination  Mental status/Cognition: Alert, oriented to self, place, month and year, good attention.  Speech/language: Fluent, comprehension intact, object naming intact, repetition intact.  Cranial nerves:   CN II Pupils equal and reactive to light, no VF deficits but endorses blurred vision.   CN III,IV,VI EOM intact, no gaze preference or deviation, no nystagmus    CN V normal sensation in V1, V2, and V3 segments bilaterally    CN VII no asymmetry, no nasolabial fold flattening   CN VIII normal hearing to speech   CN IX & X normal palatal elevation, no uvular deviation   CN XI 5/5 head turn and 5/5 shoulder shrug bilaterally   CN XII midline tongue protrusion   Motor:  Muscle bulk: normal, tone normal, pronator drift none tremor none Mvmt Root Nerve  Muscle Right Left Comments  SA C5/6 Ax Deltoid 5 5   EF C5/6 Mc Biceps 5 5   EE C6/7/8 Rad Triceps 5 5   WF C6/7 Med FCR     WE C7/8 PIN ECU     F Ab C8/T1 U ADM/FDI 5 5   HF L1/2/3 Fem Illopsoas 4 5   KE L2/3/4 Fem Quad 5 5   DF L4/5 D Peron Tib Ant 4 5   PF S1/2 Tibial Grc/Sol 4 5    Reflexes:  Right Left Comments  Pectoralis      Biceps (C5/6) 2 2   Brachioradialis (C5/6) 2 2    Triceps (C6/7) 2 2    Patellar (L3/4) 3 3 Cross adductors positive.   Achilles (S1) 2 2    Hoffman - -    Plantar up mute   Jaw jerk    Sensation:  Light touch Decreased in L lower face, LUE and LLE   Pin prick Decreased in L lower face, LUE and LLE   Temperature Decreased in LUE and LLE   Vibration Decreased in  LUE and LLE  Proprioception    Coordination/Complex Motor:  - Finger to Nose intact BL - Heel to shin uanble to do  with RLE. - Rapid alternating movement are normal - Gait: not safe to assess given RLE weakness.  Labs   CBC:  Recent Labs  Lab 02/19/21 0335 02/19/21 0350  WBC 6.6  --   NEUTROABS 4.2  --   HGB 12.3* 13.6  HCT 37.5* 40.0  MCV 72.4*  --   PLT 219  --     Basic Metabolic Panel:  Lab Results  Component Value Date   NA 140 02/19/2021   K 3.0 (L) 02/19/2021   CO2 23 02/19/2021   GLUCOSE 112 (H) 02/19/2021   BUN 28 (H) 02/19/2021   CREATININE 3.20 (H) 02/19/2021   CALCIUM 9.0 02/19/2021   GFRNONAA 26 (L) 02/19/2021   GFRAA 42 (L) 02/25/2020   Lipid Panel:  Lab Results  Component Value Date   LDLCALC 71 08/09/2020   HgbA1c:  Lab Results  Component Value Date   HGBA1C 5.8 (H) 08/30/2020   Urine Drug Screen:     Component Value Date/Time   LABOPIA NONE DETECTED 02/19/2021 0330   COCAINSCRNUR NONE DETECTED 02/19/2021 0330   LABBENZ NONE DETECTED 02/19/2021 0330   AMPHETMU NONE DETECTED 02/19/2021 0330   THCU POSITIVE (A) 02/19/2021 0330   LABBARB NONE DETECTED 02/19/2021 0330    Alcohol Level     Component Value Date/Time   ETH <10 02/19/2021 0335    CT Head without contrast: CTH was negative for a large hypodensity concerning for a large territory infarct or hyperdensity concerning for an ICH  MRI Brain: pending  MRI C spine without contrast: pending  Impression   Eshawn Coor is a 45 y.o. male with PMH significant for CAD, DM2, HTN, MI, prior stroke who presents with multiple complaints that abruptly started around 2050 on 02/18/21 when he was walking out from work and to his car. Symptoms include BL hands and feet numbness and tingling, L Face, LUE and LLE decreased sensation to touch, BL blurred vision and dizziness and right low back pain radiating into his R Leg. He was hypertensive to 209O systolic on arrival and [facility] was negative  for an acute stroke. In addition, also has had RLE weakness for 4 years with worsening of his baseline weakness last night.  It is difficult to put all of his symptoms together, I suspect that his significantly elevated blood pressure likely has a role to play. Hypokalemia may also have a role to play and can cause numbness, tingling and muscle spasms but I think that a Potassium of 3 would be unlikely to cause the focal numbness on the left.  Given the noted focal left sided numbness on exam along with several stroke risk factors, reasonable to get MRI Brain. I will add an MRI C spine as the spinal trigeminal nucleus that supplies sensation to the lower face, does extend down into the upper cervical spinal cord.  This will also help rule out a potential demyelinating disorder.  Usually nutritional deficiencies do not cause abrupt onset symptoms.  Recommendations  - MRI Brain without contrast - MRI Cervical spine without contrast. - BP control per primary team. Recommend goal < 220/110 until MRIs. If MRI Brain is negative for an acute stroke, aim for gradual normotension. - Further workup pending above. ______________________________________________________________________   Thank you for the opportunity to take part in the care of this patient. If you have any further questions, please contact the neurology consultation attending.  Signed,  [place] Pager Number [phone] _ _ _   _ __  _ __ _ _  __ __   _ __   __ _  

## 2021-02-19 NOTE — ED Triage Notes (Signed)
EMS report ongoing BP issues. With several BP adjustments.. Report SBP >200 at home. New onset of dizziness at home. Denies Pain.

## 2021-02-19 NOTE — Plan of Care (Addendum)
Neurology Plan of Care:  MRI Cervical Spine reviewed by attending MD and discussed with NP-  IMPRESSION: 1. Positive for two discrete cervical spinal cord lesions, both involving the right hemicord and the larger at C5. No cord expansion. No corresponding spinal stenosis at these levels. In light of the Brain findings today, Demyelinating Disease may be most likely. Other inflammatory conditions are possible. The appearance does NOT suggest cord tumor.   2. Some underlying congenital spinal canal narrowing but relatively mild superimposed degenerative disease. There is mild spinal stenosis at C4-C5 and C6-C7, with mild cord mass effect but no cord signal abnormality at the latter. Severe left and moderate right C7 neural foraminal stenosis.  Updated/Addended A&P: Concern for demyelinating disease - NMO v MS v MOG antibody disease  -Plan to obtain MRI C-spine with contrast.  -Will add MR Brain w contrast as well as MR orbits w+w/o due to blurred vision -Will add Anti NMO abs and Anti MOG abs to labs as well. -Decision on steroids following MRI imaging.  Neurology will follow.  Anibal Henderson, NP [phone]  [facility]  Attending Neurohospitalist Addendum Patient seen and examined with APP/Resident. Agree with the history and physical as documented above. Agree with the plan as documented, which I helped formulate. I have independently reviewed the chart, obtained history, review of systems and examined the patient.I have personally reviewed pertinent head/neck/spine imaging (CT/MRI). Please feel free to call with any questions.  -- Amie [place], MD Neurologist [facility] Pager: [phone]

## 2021-02-19 NOTE — ED Provider Notes (Signed)
[facility] Emergency Department Provider Note MRN:  [id number]  Arrival date & time: 02/19/21     Chief Complaint   Dizziness   History of Present Illness   George Day is a 45 y.o. year-old male with a history of hypertension, diabetes, MI, stroke presenting to the ED with chief complaint of dizziness.  Patient was leaving work at about 5:00 when he experienced sudden onset pain in the right mid and lower back.  Also experiencing abdominal pain, pain in the upper legs worse on the right.  Also experiencing dizziness, blurry vision.  Saw that his blood pressure was very high, here for evaluation.  Feels generally unwell.  Denies any chest pain or shortness of breath.  Has some decreased sensation to the left arm and left leg which also started at 5 PM.  Review of Systems  A complete 10 system review of systems was obtained and all systems are negative except as noted in the HPI and PMH.   Patient's Health History    Past Medical History:  Diagnosis Date   Anemia 1998   Anginal pain ([place])    Anxiety    Asthma    Coronary artery disease    Diabetes mellitus without complication ([place]) 8299   Hypertension 2019   MI (myocardial infarction) ([place]) 2010   Stroke ([place]) 2010    Past Surgical History:  Procedure Laterality Date   CARDIAC CATHETERIZATION  2010    Family History  Problem Relation Age of Onset   Diabetes Mother    Hyperlipidemia Mother    Diabetes Sister    Hyperlipidemia Sister    Hearing loss Sister    Diabetes Brother    Hyperlipidemia Brother     Social History   Socioeconomic History   Marital status: Single    Spouse name: Not on file   Number of children: 3   Years of education: Not on file   Highest education level: Not on file  Occupational History   Not on file  Tobacco Use   Smoking status: Some Days    Packs/day: 0.50    Years: 31.00    Pack years: 15.50    Types: Cigars, E-cigarettes, Cigarettes   Smokeless  tobacco: Never  Vaping Use   Vaping Use: Never used  Substance and Sexual Activity   Alcohol use: Yes    Comment: occasioanlly    Drug use: Never   Sexual activity: Yes    Birth control/protection: None  Other Topics Concern   Not on file  Social History Narrative   Not on file   Social Determinants of Health   Financial Resource Strain: Not on file  Food Insecurity: Not on file  Transportation Needs: Not on file  Physical Activity: Not on file  Stress: Not on file  Social Connections: Not on file  Intimate Partner Violence: Not on file     Physical Exam   Vitals:   02/19/21 0600 02/19/21 0645  BP: (!) 190/112 (!) 210/127  Pulse: 84 86  Resp: (!) 24 (!) 21  Temp:    SpO2: 96% 99%    CONSTITUTIONAL: Ill-appearing, mild dyspnea NEURO:  Alert and oriented x 3, decreased sensation to left arm and left leg, normal and symmetric strength, normal speech, no visual field cuts but impaired visual acuity, no aphasia, no neglect EYES:  eyes equal and reactive ENT/NECK:  no LAD, no JVD CARDIO: Regular rate, well-perfused, normal S1 and S2 PULM:  CTAB no wheezing or rhonchi  GI/GU:  normal bowel sounds, non-distended, non-tender MSK/SPINE:  No gross deformities, no edema SKIN:  no rash, atraumatic PSYCH:  Appropriate speech and behavior  *Additional and/or pertinent findings included in MDM below  Diagnostic and Interventional Summary    EKG Interpretation  Date/Time:  Sunday February 19 2021 03:22:07 EDT Ventricular Rate:  90 PR Interval:  160 QRS Duration: 86 QT Interval:  392 QTC Calculation: 479 R Axis:   -14 Text Interpretation: Sinus rhythm with Premature atrial complexes Possible Left atrial enlargement Septal infarct , age undetermined Abnormal ECG No significant change was found Confirmed by Gerlene Fee [phone]) on 02/19/2021 4:18:13 AM        Labs Reviewed  CBC - Abnormal; Notable for the following components:      Result Value   Hemoglobin 12.3 (*)    HCT  37.5 (*)    MCV 72.4 (*)    MCH 23.7 (*)    All other components within normal limits  COMPREHENSIVE METABOLIC PANEL - Abnormal; Notable for the following components:   Potassium 3.0 (*)    Glucose, Bld 115 (*)    BUN 27 (*)    Creatinine, Ser 2.93 (*)    Albumin 3.4 (*)    GFR, Estimated 26 (*)    All other components within normal limits  RAPID URINE DRUG SCREEN, HOSP PERFORMED - Abnormal; Notable for the following components:   Tetrahydrocannabinol POSITIVE (*)    All other components within normal limits  URINALYSIS, ROUTINE W REFLEX MICROSCOPIC - Abnormal; Notable for the following components:   Color, Urine STRAW (*)    Glucose, UA 50 (*)    Hgb urine dipstick SMALL (*)    Protein, ur 100 (*)    All other components within normal limits  I-STAT CHEM 8, ED - Abnormal; Notable for the following components:   Potassium 3.0 (*)    BUN 28 (*)    Creatinine, Ser 3.20 (*)    Glucose, Bld 112 (*)    Calcium, Ion 1.11 (*)    All other components within normal limits  TROPONIN I (HIGH SENSITIVITY) - Abnormal; Notable for the following components:   Troponin I (High Sensitivity) 49 (*)    All other components within normal limits  RESP PANEL BY RT-PCR (FLU A&B, COVID) ARPGX2  ETHANOL  PROTIME-INR  APTT  DIFFERENTIAL  TROPONIN I (HIGH SENSITIVITY)    CT Angio Chest/Abd/Pel for Dissection W and/or Wo Contrast  Final Result    CT HEAD WO CONTRAST  Final Result    MR BRAIN WO CONTRAST    (Results Pending)  MR CERVICAL SPINE WO CONTRAST    (Results Pending)    Medications  labetalol (NORMODYNE) injection 10 mg (10 mg Intravenous Given 02/19/21 0653)  labetalol (NORMODYNE) injection 10 mg (10 mg Intravenous Given 02/19/21 0449)  iohexol (OMNIPAQUE) 350 MG/ML injection 100 mL (100 mLs Intravenous Contrast Given 02/19/21 0515)     Procedures  /  Critical Care .Critical Care  Date/Time: 02/19/2021 6:59 AM Performed by: Maudie Flakes, MD Authorized by: Maudie Flakes, MD    Critical care provider statement:    Critical care time (minutes):  35   Critical care was necessary to treat or prevent imminent or life-threatening deterioration of the following conditions: Hypertensive emergency, concern for acute ischemic stroke.   Critical care was time spent personally by me on the following activities:  Discussions with consultants, evaluation of patient's response to treatment, examination of patient, ordering and performing treatments and  interventions, ordering and review of laboratory studies, ordering and review of radiographic studies, pulse oximetry, re-evaluation of patient's condition, obtaining history from patient or surrogate and review of old charts  ED Course and Medical Decision Making  I have reviewed the triage vital signs, the nursing notes, and pertinent available records from the EMR.  Listed above are laboratory and imaging tests that I personally ordered, reviewed, and interpreted and then considered in my medical decision making (see below for details).  Initial concerns for hypertensive emergency, acute hemorrhagic or ischemic stroke, also concern for possible aortic dissection given the back pain, abdominal pain, radiating into the legs with the noted neurological deficit.  CT head is unremarkable, obtaining emergent CT dissection study.     CT is without evidence of dissection, discussed with neurology, will obtain MRI and admit to medicine.  Barth Kirks. Sedonia Small, [place] [email or web address]  Final Clinical Impressions(s) / ED Diagnoses     ICD-10-CM   1. Hypertensive emergency  I16.1       ED Discharge Orders     None        Discharge Instructions Discussed with and Provided to Patient:   Discharge Instructions   None       Maudie Flakes, MD 02/19/21 0700

## 2021-02-19 NOTE — ED Notes (Signed)
Sent to MRI at this time

## 2021-02-19 NOTE — ED Notes (Signed)
Pt states that his arms and legs are numb and when touched it feels like "many needle pricks". Pt was able to stand from wheelchair and sit on bed independently.

## 2021-02-19 NOTE — ED Notes (Signed)
Hospitalitis at the bedside.

## 2021-02-19 NOTE — ED Notes (Signed)
Dr Bonnell Public is aware of his high blood pressure. She is getting in contact with ICU and will start an IV soon.

## 2021-02-19 NOTE — Consult Note (Signed)
NAME:  George Day, MRN:  [id number], DOB:  July 06, 1976, LOS: 0 ADMISSION DATE:  02/19/2021, CONSULTATION DATE:  7/3 REFERRING MD:  Dr. Hebert Soho CHIEF COMPLAINT:  HTN   History of Present Illness:  45 y/o M who presented to [facility] ER on 7/3 with reports of blurry vision, headache, numbness/tingling in extremities.   The patient reports he lives at home with his wife.  Has 4 daughters.  Works at [employer] - feels like it is easy work.  Reports his "doctors took him off his blood pressure medications" at some point.  Has been followed by Cardiology in past for HTN.  Most recently has had a negative renal artery ultrasound for stenosis (07/2020).  ECHO in 07/2020 showed LVEF 50-55%, no RWMA, moderate LVH, grade II diastolic dysfunction, LA mildly dilated, trivial pericardial effusion.  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  He reports he was told he had to get his blood pressure under better control before biopsy.  When asked about his blood pressure regimen, he reports he has been taking Norvasc.  Was recently given HCTZ but was afraid to take it thinking it would "lower my blood pressure too much".  His home medication list shows he is supposed to be on coreg as well but is not taking it.    He reports on 7/2 PM he was driving home and his vision became blurry.  He had a headache as well.  He reports he "tried to tough it out" but had difficulty driving home - to the point he was afraid he would have to pull over and called his wife.  He went home and then developed numbness in his arms and legs with tingling.  They called EMS for evaluation when symptoms did not improve after he laid down.  He had mild shortness of breath. Initial ER work up notable for profound hypertension - initial BP 183/128.  UDS positive for THC, ETOH negative.   Labs notable for AKI (baseline 1.8-2.6), UA with protein, troponin 49, K 3.0.  he was treated with two doses of IV labetalol without improvement in blood pressure.    PCCM consulted for hypertensive emergency.   Pertinent  Medical History  Anemia  Anxiety  CAD HTN - negative renal artery duplex in 07/2020 CVA  MI - 2010 CKD  DM II  Adrenal Mass   Significant Hospital Events: Including procedures, antibiotic start and stop dates in addition to other pertinent events   7/3 Admit   Interim History / Subjective:  As above   Objective   Blood pressure (!) 210/127, pulse 86, temperature 98.4 F (36.9 C), temperature source Oral, resp. rate (!) 21, SpO2 99 %.       No intake or output data in the 24 hours ending 02/19/21 0847 There were no vitals filed for this visit.  Examination: General:  adult male lying in bed in NAD  HEENT: MM pink/moist, anicteric Neuro: AAOx4, speech clear, MAE CV: s1s2 RRR, no m/r/g PULM:  non-labored at rest, clear breath sounds bilaterally  GI: soft, bsx4 active  Extremities: warm/dry, no edema  Skin: no rashes or lesions  Resolved Hospital Problem list      Assessment & Plan:   Hypertensive Emergency  Proteinuria  Initial BP 183/128, MAP 146. Note proteinuria. Stroke evaluation negative. Hx of prior MI, CVA.  Prior ECHO with mild LVH, Grade II diastolic dysfunction.  Negative renal artery study in 09/2020 -admit to ICU -goal 25% reduction of MAP / BP over next 6-12 hours, goal MAP ~ 110 -add oral coreg, norvasc -will need close follow up for BP management  -educated patient regarding long term effects of uncontrolled HTN -troponin flat, follow trend  -EKG reviewed  AKI on CKD Hypokalemia -BP control largest contributing factor -Trend BMP / urinary output -Replace electrolytes as indicated, KCL 7/3 -Avoid nephrotoxic agents, ensure adequate renal perfusion  DM II  Reported hx, not on meds -assess A1c   Anemia  -follow CBC    Adrenal Mass  Hematoma vs cystic neoplasm  -will need biopsy at some point, but BP will need to be controlled -follow up as outpatient with PCP    Best Practice (right click and "Reselect all SmartList Selections" daily)   Diet/type: Regular consistency (see orders) DVT prophylaxis: systemic heparin GI prophylaxis: N/A Lines: N/A Foley:  N/A Code Status:  full code Last date of multidisciplinary goals of care discussion: n/a, full code.   Labs   CBC: Recent Labs  Lab 02/19/21 0335 02/19/21 0350  WBC 6.6  --   NEUTROABS 4.2  --   HGB 12.3* 13.6  HCT 37.5* 40.0  MCV 72.4*  --   PLT 219  --     Basic Metabolic Panel: Recent Labs  Lab 02/15/21 1458 02/19/21 0335 02/19/21 0350  NA 137 136 140  K 3.2* 3.0* 3.0*  CL 101 105 104  CO2 30 23  --   GLUCOSE 84 115* 112*  BUN 29* 27* 28*  CREATININE 2.90* 2.93* 3.20*  CALCIUM 9.3 9.0  --    GFR: Estimated Creatinine Clearance: 33.6 mL/min (A) (by C-G formula based on SCr of 3.2 mg/dL (H)). Recent Labs  Lab 02/19/21 0335  WBC 6.6    Liver Function Tests: Recent Labs  Lab 02/19/21 0335  AST 32  ALT 27  ALKPHOS 77  BILITOT 1.1  PROT 7.5  ALBUMIN 3.4*   No results for input(s): LIPASE, AMYLASE in the last 168 hours. No results for input(s): AMMONIA in the last 168 hours.  ABG    Component Value Date/Time   TCO2 23 02/19/2021 0350     Coagulation Profile: Recent Labs  Lab 02/19/21 0335  INR 1.0    Cardiac Enzymes: No results for input(s): CKTOTAL, CKMB, CKMBINDEX, TROPONINI in the last 168 hours.  HbA1C: HbA1c POC (<> result, manual entry)  Date/Time Value Ref Range Status  01/06/2020 08:51 AM 5.6 4.0 - 5.6 % Final   Hgb A1c MFr Bld  Date/Time Value Ref Range Status  08/30/2020 02:48 PM 5.8 (H) 4.8 - 5.6 % Final    Comment:             Prediabetes: 5.7 - 6.4          Diabetes: >6.4          Glycemic control for adults with diabetes: <7.0     CBG: No results for input(s): GLUCAP in  the last 168 hours.  Review of Systems: Positives in bold  Gen: Denies fever, chills, weight change, fatigue, night sweats HEENT: Denies blurred vision, double vision, hearing loss, tinnitus, sinus congestion, rhinorrhea, sore throat, neck stiffness, dysphagia PULM: Denies shortness of breath, cough, sputum production, hemoptysis, wheezing CV: Denies chest pain, edema, orthopnea, paroxysmal nocturnal dyspnea, palpitations GI: Denies abdominal pain, nausea, vomiting, diarrhea, hematochezia, melena, constipation, change in bowel habits GU: Denies  dysuria, hematuria, polyuria, oliguria, urethral discharge Endocrine: Denies hot or cold intolerance, polyuria, polyphagia or appetite change Derm: Denies rash, dry skin, scaling or peeling skin change Heme: Denies easy bruising, bleeding, bleeding gums Neuro: Denies headache, numbness, weakness, slurred speech, loss of memory or consciousness   Past Medical History:  He,  has a past medical history of Anemia (1998), Anginal pain ([place]), Anxiety, Asthma, Coronary artery disease, Diabetes mellitus without complication ([place]) (2633), Hypertension (2019), MI (myocardial infarction) ([place]) (2010), and Stroke ([place]) (2010).   Surgical History:   Past Surgical History:  Procedure Laterality Date   CARDIAC CATHETERIZATION  2010     Social History:   reports that he has been smoking cigars, e-cigarettes, and cigarettes. He has a 15.50 pack-year smoking history. He has never used smokeless tobacco. He reports current alcohol use. He reports that he does not use drugs.   Family History:  His family history includes Diabetes in his brother, mother, and sister; Hearing loss in his sister; Hyperlipidemia in his brother, mother, and sister.   Allergies Allergies  Allergen Reactions   Penicillins Anaphylaxis    ALL   Tomato Swelling     Home Medications  Prior to Admission medications   Medication Sig Start Date End Date Taking? Authorizing Provider   amLODipine (NORVASC) 10 MG tablet TAKE 1 TABLET BY MOUTH ONCE A DAY 10/11/20 10/11/21  Almyra Deforest, PA  aspirin EC 81 MG tablet Take 1 tablet (81 mg total) by mouth daily. Swallow whole. Patient not taking: Reported on 02/15/2021 08/29/20   Ladell Pier, MD  atorvastatin (LIPITOR) 40 MG tablet Take 1 tablet (40 mg total) by mouth daily. Patient not taking: Reported on 02/15/2021 11/28/20   Ladell Pier, MD  carvedilol (COREG) 3.125 MG tablet Take 1 tablet (3.125 mg total) by mouth 2 (two) times daily with a meal. Patient not taking: Reported on 02/15/2021 11/28/20   Ladell Pier, MD  fluticasone-salmeterol (ADVAIR) 250-50 MCG/ACT AEPB Inhale 1 puff into the lungs 2 (two) times daily. Patient not taking: Reported on 02/15/2021 08/29/20 08/29/21  Ladell Pier, MD  nitroGLYCERIN (NITROSTAT) 0.6 MG SL tablet Place 0.6 mg under the tongue every 5 (five) minutes as needed for chest pain. Patient not taking: Reported on 02/15/2021    [provider]     Critical care time: 64 minutes      Noe Gens, MSN, APRN, NP-C, AGACNP-BC [facility] 02/19/2021, 8:47 AM   Please see [email or web address] for pager details.   From 7A-7P if no response, please call [phone] After hours, please call ELink [phone]

## 2021-02-19 NOTE — Progress Notes (Signed)
I spoke with the Radiologist, Dr. Posey Pronto regarding giving this pt Gadavist with his creatine history. He prefers to wait to Gad this pt once his creatine comes down some and his GFR comes up.

## 2021-02-20 ENCOUNTER — Inpatient Hospital Stay (HOSPITAL_COMMUNITY): Payer: Self-pay

## 2021-02-20 LAB — BASIC METABOLIC PANEL
Anion gap: 6 (ref 5–15)
BUN: 28 mg/dL — ABNORMAL HIGH (ref 6–20)
CO2: 21 mmol/L — ABNORMAL LOW (ref 22–32)
Calcium: 8.6 mg/dL — ABNORMAL LOW (ref 8.9–10.3)
Chloride: 106 mmol/L (ref 98–111)
Creatinine, Ser: 3.02 mg/dL — ABNORMAL HIGH (ref 0.61–1.24)
GFR, Estimated: 25 mL/min — ABNORMAL LOW (ref >60)
Glucose, Bld: 122 mg/dL — ABNORMAL HIGH (ref 70–99)
Potassium: 3.2 mmol/L — ABNORMAL LOW (ref 3.5–5.1)
Sodium: 133 mmol/L — ABNORMAL LOW (ref 135–145)

## 2021-02-20 LAB — ALDOSTERONE + RENIN ACTIVITY W/ RATIO
ALDOS/RENIN RATIO: 0.9 (ref 0.0–30.0)
ALDOSTERONE: 11.2 ng/dL (ref 0.0–30.0)
Renin: 12.903 ng/mL/hr — ABNORMAL HIGH (ref 0.167–5.380)

## 2021-02-20 LAB — PHOSPHORUS: Phosphorus: 2.9 mg/dL (ref 2.5–4.6)

## 2021-02-20 LAB — CBC
HCT: 37.4 % — ABNORMAL LOW (ref 39.0–52.0)
Hemoglobin: 12.6 g/dL — ABNORMAL LOW (ref 13.0–17.0)
MCH: 23.8 pg — ABNORMAL LOW (ref 26.0–34.0)
MCHC: 33.7 g/dL (ref 30.0–36.0)
MCV: 70.7 fL — ABNORMAL LOW (ref 80.0–100.0)
Platelets: 246 10*3/uL (ref 150–400)
RBC: 5.29 MIL/uL (ref 4.22–5.81)
RDW: 15.2 % (ref 11.5–15.5)
WBC: 6.4 10*3/uL (ref 4.0–10.5)
nRBC: 0 % (ref 0.0–0.2)

## 2021-02-20 LAB — DHEA-SULFATE: DHEA-SO4: 86.1 ug/dL (ref 71.6–375.4)

## 2021-02-20 LAB — MAGNESIUM: Magnesium: 2.1 mg/dL (ref 1.7–2.4)

## 2021-02-20 MED ORDER — POTASSIUM CHLORIDE CRYS ER 20 MEQ PO TBCR
20.0000 meq | EXTENDED_RELEASE_TABLET | Freq: Once | ORAL | Status: AC
  Administered 2021-02-20: 20 meq via ORAL
  Filled 2021-02-20: qty 1

## 2021-02-20 MED ORDER — HYDROCHLOROTHIAZIDE 25 MG PO TABS
25.0000 mg | ORAL_TABLET | Freq: Every day | ORAL | Status: DC
  Administered 2021-02-20 – 2021-02-22 (×3): 25 mg via ORAL
  Filled 2021-02-20 (×3): qty 1

## 2021-02-20 NOTE — Plan of Care (Signed)

## 2021-02-20 NOTE — Progress Notes (Addendum)
NAME:  George Day, MRN:  [id number], DOB:  [birth year], LOS: 1 ADMISSION DATE:  02/19/2021, CONSULTATION DATE:  7/3 REFERRING MD:  Dr. Hebert Soho CHIEF COMPLAINT:  HTN   History of Present Illness:  45 y/o M who presented to [facility] ER on 7/3 with reports of blurry vision, headache, numbness/tingling in extremities.   The patient reports he lives at home with his wife.  Has 4 daughters.  Works at [employer] - feels like it is easy work.  Reports his "doctors took him off his blood pressure medications" at some point.  Has been followed by Cardiology in past for HTN.  Most recently has had a negative renal artery ultrasound for stenosis (07/2020).  ECHO in 07/2020 showed LVEF 50-55%, no RWMA, moderate LVH, grade II diastolic dysfunction, LA mildly dilated, trivial pericardial effusion.  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  He reports he was told he had to get his blood pressure under better control before biopsy.  When asked about his blood pressure regimen, he reports he has been taking Norvasc.  Was recently given HCTZ but was afraid to take it thinking it would "lower my blood pressure too much".  His home medication list shows he is supposed to be on coreg as well but is not taking it.    He reports on 7/2 PM he was driving home and his vision became blurry.  He had a headache as well.  He reports he "tried to tough it out" but had difficulty driving home - to the point he was afraid he would have to pull over and called his wife.  He went home and then developed numbness in his arms and legs with tingling.  They called EMS for evaluation when symptoms did not improve after he laid down.  He had mild shortness of breath. Initial ER work up notable for profound hypertension - initial BP 183/128.  UDS positive for THC, ETOH negative.   Labs notable for AKI (baseline 1.8-2.6), UA with protein, troponin 49, K 3.0.  he was treated with two doses of IV labetalol without improvement in blood pressure.    PCCM consulted for hypertensive emergency.   Pertinent  Medical History  Anemia  Anxiety  CAD HTN - negative renal artery duplex in 07/2020 CVA  MI - 2010 CKD  DM II  Adrenal Mass   Significant Hospital Events: Including procedures, antibiotic start and stop dates in addition to other pertinent events   7/3 Admit with hypertensive emergency  7/4 Improved BP control on coreg 25mg  BID, norvasc 10 mg QD. HCTZ added.   Interim History / Subjective:  Pt reports vision is improved, notes arms/legs are feeling more normal.  Afebrile  On cardene gtt  I/O 784ml UOP, +342ml in last 24 hours   Objective   Blood pressure (!) 161/92, pulse 72, temperature 98.6 F (37 C), temperature source Oral, resp. rate 19, weight 97.1 kg, SpO2 99 %.        Intake/Output Summary (Last 24 hours) at 02/20/2021 0838 Last data filed at 02/20/2021 0600 Gross per 24 hour  Intake 1062.19 ml  Output 750 ml  Net 312.19 ml   Filed Weights   02/20/21 0500  Weight: 97.1 kg    Examination: General:  adult male sitting up in chair in NAD eating breakfast  HEENT: MM pink/moist,  anicteric, pupils equal / reactive  Neuro: AAOx4, speech clear, MAE  CV: s1s2 RRR, ?soft murmur  PULM: non-labored on RA, lungs clear bilaterally  GI: soft, bsx4 active  Extremities: warm/dry, no edema  Skin: no rashes or lesions  CXR 7/4 > images personally reviewed, mild hyperinflation, no acute infiltrate or Cardiomegaly   Resolved Hospital Problem list      Assessment & Plan:   Hypertensive Emergency  Proteinuria  Initial BP 183/128, MAP 146. Note proteinuria. Stroke evaluation negative. Hx of prior MI, CVA.  Prior ECHO 07/2020 with mild LVH, Grade II diastolic dysfunction.  Negative renal artery study in 09/2020.  Troponin flat, no acute EKG changes.  -wean  cardene gtt off, transition SBP goal <170 -continue coreg, norvasc -add HCTZ  -discussed again with patient importance of long term BP control > reviewed CVA / MI risk, CKD/ HD -encouraged cessation of smoking THC -he will need close follow up for BP control at discharge   Possible Demyelinating Disease  NMO vs MS vs MOG Antibody Disease  -appreciate Neurology evaluation  -defer work up to Neuro  -MRI Brain with no acute abnormality > based on accelerated HTN, ? If findings of non-specific white matter signal is related to poorly controlled HTN -MRI Cervical Spine with two discrete cervical spinal cord lesions, both involving the right hemicord > raising question of demyelinating disease / possible MS -additional differential to consider is PRES  AKI on CKD III Hypokalemia -BP control  -Trend BMP / urinary output -Replace electrolytes as indicated, s/p 20 mEq KCL am 7/4, repeat in afternoon  -Avoid nephrotoxic agents, ensure adequate renal perfusion  DM II  Reported hx, not on meds.  A1c 5.9 -diet control discussed with patient   Anemia  -follow CBC   Adrenal Mass  Hematoma vs cystic neoplasm  -will need biopsy at some point, but BP will need to be controlled -follow up as outpatient with PCP    Best Practice (right click and "Reselect all SmartList Selections" daily)  Diet/type: Regular consistency (see orders) DVT prophylaxis: systemic heparin GI prophylaxis: N/A Lines: N/A Foley:  N/A Code Status:  full code Last date of multidisciplinary goals of care discussion: n/a, full code.   Critical care time: 31 minutes      Noe Gens, MSN, APRN, NP-C, AGACNP-BC [facility] 02/20/2021, 8:38 AM   Please see [email or web address] for pager details.   From 7A-7P if no response, please call [phone] After hours, please call ELink [phone]

## 2021-02-20 NOTE — Progress Notes (Signed)
[facility] Progress Note Patient Name: George Day DOB: Dec 20, 1975 MRN: [id number]   Date of Service  02/20/2021  HPI/Events of Note  Hypokalemia - K+ = 3.2 and Creatinine = 3.02.  eICU Interventions  Will cautiously replace K+.     Intervention Category Major Interventions: Electrolyte abnormality - evaluation and management  [name] 02/20/2021, 1:13 AM

## 2021-02-20 NOTE — H&P (Signed)
Please see consultation note by B Ollis dated 02/20/2021

## 2021-02-20 NOTE — Progress Notes (Signed)
Subjective: No significant changes, still with difficulty with BP  Exam: Vitals:   02/20/21 0500 02/20/21 0600  BP: (!) 152/86 (!) 150/83  Pulse: 98 96  Resp: (!) 25 (!) 22  Temp:    SpO2: 100% 98%   Gen: In bed, NAD Resp: non-labored breathing, no acute distress Abd: soft, nt  Neuro: MS: awake, alert interactive and appropriate CN:VFF, EOMI Motor: 5/5 in BUE, he has 4/5 weakness in the RLE Sensory:Decreased to LT and temp in the right leg.  DTR:3+ in the right patella, 2+ in the left.   Pertinent Labs: Na 132 Mg 2.1 Ca 8.6, albumin 3.4 Cr 3.02   Impression: 45 yo M with bilateral paresthesias, blurred vision, hypertensive emergency. He has a spinal lesion that could be consistent with MS, but the sudden onset is unusual. Spinal ischemia would be another possibility. The hypertensive emergency could be responsible for the blurred vision, with the spinal pathology being unrelated.   Contrasted MRI would be helpful, but unfortunately, his GFR is too low at this time. With a remote issue with his right leg, I wonder if there is a thoracic spinal lesion as well which would establish dissemination in time and space. Also, with blurred vision, will get MRI orbits to assess for asymmetric T2 signal that might establish a previous optic neuritis. If these are negative, then will pursue LP for OC bands and IgG index.   Recommendations: 1) Agree with BP control.  2) MRI T-spine, orbits w/o contrast.  3) Will follow.   Roland Rack, MD [facility] [phone]  If 7pm- 7am, please page neurology on call as listed in [place].

## 2021-02-21 DIAGNOSIS — I169 Hypertensive crisis, unspecified: Secondary | ICD-10-CM

## 2021-02-21 LAB — CBC
HCT: 33.3 % — ABNORMAL LOW (ref 39.0–52.0)
Hemoglobin: 10.8 g/dL — ABNORMAL LOW (ref 13.0–17.0)
MCH: 23.3 pg — ABNORMAL LOW (ref 26.0–34.0)
MCHC: 32.4 g/dL (ref 30.0–36.0)
MCV: 71.8 fL — ABNORMAL LOW (ref 80.0–100.0)
Platelets: 229 10*3/uL (ref 150–400)
RBC: 4.64 MIL/uL (ref 4.22–5.81)
RDW: 15.4 % (ref 11.5–15.5)
WBC: 5.5 10*3/uL (ref 4.0–10.5)
nRBC: 0 % (ref 0.0–0.2)

## 2021-02-21 LAB — MAGNESIUM: Magnesium: 2.3 mg/dL (ref 1.7–2.4)

## 2021-02-21 LAB — BASIC METABOLIC PANEL
Anion gap: 7 (ref 5–15)
Anion gap: 8 (ref 5–15)
BUN: 32 mg/dL — ABNORMAL HIGH (ref 6–20)
BUN: 31 mg/dL — ABNORMAL HIGH (ref 6–20)
CO2: 24 mmol/L (ref 22–32)
CO2: 22 mmol/L (ref 22–32)
Calcium: 8.8 mg/dL — ABNORMAL LOW (ref 8.9–10.3)
Calcium: 8.9 mg/dL (ref 8.9–10.3)
Chloride: 101 mmol/L (ref 98–111)
Chloride: 101 mmol/L (ref 98–111)
Creatinine, Ser: 3.33 mg/dL — ABNORMAL HIGH (ref 0.61–1.24)
Creatinine, Ser: 3.39 mg/dL — ABNORMAL HIGH (ref 0.61–1.24)
GFR, Estimated: 22 mL/min — ABNORMAL LOW (ref >60)
GFR, Estimated: 22 mL/min — ABNORMAL LOW (ref >60)
Glucose, Bld: 113 mg/dL — ABNORMAL HIGH (ref 70–99)
Glucose, Bld: 95 mg/dL (ref 70–99)
Potassium: 3.2 mmol/L — ABNORMAL LOW (ref 3.5–5.1)
Potassium: 3.9 mmol/L (ref 3.5–5.1)
Sodium: 132 mmol/L — ABNORMAL LOW (ref 135–145)
Sodium: 131 mmol/L — ABNORMAL LOW (ref 135–145)

## 2021-02-21 LAB — CORTISOL: Cortisol, Plasma: 10.7 ug/dL

## 2021-02-21 MED ORDER — POTASSIUM CHLORIDE CRYS ER 20 MEQ PO TBCR
40.0000 meq | EXTENDED_RELEASE_TABLET | ORAL | Status: AC
  Administered 2021-02-21 (×2): 40 meq via ORAL
  Filled 2021-02-21 (×2): qty 2

## 2021-02-21 MED ORDER — SODIUM CHLORIDE 0.9 % IV SOLN
1000.0000 mg | INTRAVENOUS | Status: DC
  Administered 2021-02-21 – 2021-02-24 (×4): 1000 mg via INTRAVENOUS
  Filled 2021-02-21 (×6): qty 8

## 2021-02-21 MED ORDER — PANTOPRAZOLE SODIUM 40 MG IV SOLR
40.0000 mg | INTRAVENOUS | Status: DC
  Administered 2021-02-21 – 2021-02-22 (×2): 40 mg via INTRAVENOUS
  Filled 2021-02-21 (×2): qty 40

## 2021-02-21 NOTE — Progress Notes (Signed)
24 hour urine collection start time: 02/21/21 at 1720.

## 2021-02-21 NOTE — Progress Notes (Signed)
Pt GFR continues to be low. Today it is lower than the previous two days. Per Dr. Leonel Ramsay, it is hold of on MRI and cancel orders.

## 2021-02-21 NOTE — Progress Notes (Signed)
Subjective: He feels his vision is essentially back to normal, but the paresthesias are unchanged.  Exam: Vitals:   02/21/21 0700 02/21/21 0800  BP: (!) 156/98 (!) 159/97  Pulse: 85 81  Resp: (!) 22 17  Temp: 97.9 F (36.6 C)   SpO2: 100% 100%   Gen: In bed, NAD Resp: non-labored breathing, no acute distress Abd: soft, nt  Neuro: MS: awake, alert interactive and appropriate CN:VFF, EOMI Motor: 5/5 in BUE, he has 4/5 weakness in the RLE Sensory:Decreased to LT and temp in the right leg.  DTR:3+ in the right patella, 2+ in the left.   Pertinent Labs: Cr 3.3   Impression: 45 yo M with bilateral paresthesias, blurred vision, hypertensive emergency.  His vision I suspect was secondary to the hypertension.  His bilateral paresthesias are most likely result of the cervical spine lesion.  Given the new onset and the presence of the T2 lesion on MRI I think that this merits steroids.  He has had previous clinical episodes which I think are referable to the thoracic lesions, making this by far most consistent with multiple sclerosis.  Most of his brain lesions are more consistent with small vessel disease, but there is a periventricular lesion that I think could be MS related.  At this time, I think that multiple sclerosis is by far the most likely diagnosis.  With the presence of multiple lesions, I think that spinal cord ischemia is essentially ruled out.  I would favor steroid therapy, but if this impacts the work-up of his adrenal mass, could hold off until work-up is complete.  Recommendations: 1) Recommend solumedrol 1 g daily for 3 to 5 days 2) neurology will continue to follow   Roland Rack, MD [facility] [phone]  If 7pm- 7am, please page neurology on call as listed in [place].

## 2021-02-21 NOTE — Progress Notes (Addendum)
NAME:  George Day, MRN:  [id number], DOB:  Oct 25, 1975, LOS: 2 ADMISSION DATE:  02/19/2021, CONSULTATION DATE:  7/3 REFERRING MD:  Dr. Hebert Soho CHIEF COMPLAINT:  HTN   History of Present Illness:  45 y/o M who presented to [facility] ER on 7/3 with reports of blurry vision, headache, numbness/tingling in extremities.   The patient reports he lives at home with his wife.  Has 4 daughters.  Works at [employer] - feels like it is easy work.  Reports his "doctors took him off his blood pressure medications" at some point.  Has been followed by Cardiology in past for HTN.  Most recently has had a negative renal artery ultrasound for stenosis (07/2020).  ECHO in 07/2020 showed LVEF 50-55%, no RWMA, moderate LVH, grade II diastolic dysfunction, LA mildly dilated, trivial pericardial effusion.  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  He reports he was told he had to get his blood pressure under better control before biopsy.  When asked about his blood pressure regimen, he reports he has been taking Norvasc.  Was recently given HCTZ but was afraid to take it thinking it would "lower my blood pressure too much".  His home medication list shows he is supposed to be on coreg as well but is not taking it.    He reports on 7/2 PM he was driving home and his vision became blurry.  He had a headache as well.  He reports he "tried to tough it out" but had difficulty driving home - to the point he was afraid he would have to pull over and called his wife.  He went home and then developed numbness in his arms and legs with tingling.  They called EMS for evaluation when symptoms did not improve after he laid down.  He had mild shortness of breath. Initial ER work up notable for profound hypertension - initial BP 183/128.  UDS positive for THC, ETOH negative.   Labs notable for AKI (baseline 1.8-2.6), UA with protein, troponin 49, K 3.0.  he was treated with two doses of IV labetalol without improvement in blood pressure.    PCCM consulted for hypertensive emergency.   Pertinent  Medical History  Anemia  Anxiety  CAD HTN - negative renal artery duplex in 07/2020 CVA  MI - 2010 CKD  DM II  Adrenal Mass   Significant Hospital Events: Including procedures, antibiotic start and stop dates in addition to other pertinent events   7/3 Admit with hypertensive emergency  7/4 Improved BP control on coreg 25mg  BID, norvasc 10 mg QD. HCTZ added.  7/5 Off nicardipine; Neuro following: MRI spine indicative of MS; Neuro recommend starting solumedrol  Interim History / Subjective:  Patient has no complaints; sitting up in chair UE and LE numbness worse on right compared to left; no HA or blurry vision today. Afebrile UOP -900 last 24 hours Off nicardipine drip  Objective   Blood pressure (!) 158/107, pulse 72, temperature 98.3 F (36.8 C), temperature source Oral, resp. rate (!) 25, weight 99.5 kg, SpO2 100 %.        Intake/Output Summary (Last 24 hours) at 02/21/2021 1138 Last data filed at 02/21/2021 1000 Gross per 24 hour  Intake 1604.51 ml  Output 1100 ml  Net 504.51 ml    Filed Weights   02/20/21 0500 02/21/21 5732  Weight: 97.1 kg 99.5 kg    Examination: General:  NAD; sitting up in chair HEENT: MM pink/moist; PERR Neuro: Aox4; normal speech; moves all extremities; more numbness on right UE and LE compared to left CV: s1s2, RRR, no m/r/g PULM:  dim clear bs bilaterally; on room air GI: soft, bsx4 active  Extremities: warm/dry, trace edema BLE Skin: no rashes or lesions  Labs: K 3.2 Na 132 Hgb 10.8 BUN 32 (from 28); Creat. 3.33 (from 3.02)  Resolved Hospital Problem list      Assessment & Plan:   Hypertensive Emergency  Proteinuria  Initial BP 183/128, MAP 146. Note proteinuria. Stroke evaluation negative. Hx of prior  MI, CVA.  Prior ECHO 07/2020 with mild LVH, Grade II diastolic dysfunction.  Negative renal artery study in 09/2020.  Troponin flat, no acute EKG changes.  P: -Continue hctz, coreg, and norvasc -SBP goal <170 -Education given to quit smoking and to take home BP meds  Possible Demyelinating Disease  MRI cervical spine: T2 lesion; Neuro believes likely MS P: -Defer to Neuro -Will start solumedrol 1 g daily 3-5 days later today; after cortisol lab draw  AKI on CKD III Hypokalemia P: -Klor Con increased to 40 mEq this morning -Recheck BMP/mag this later today -Trend bmp/urine output -Avoid nephrotoxic agents, ensure adequate renal perfusion  DM II  Reported hx, not on meds.  A1c 5.9 P: -diet control discussed with patient   Anemia  P: -follow CBC   Adrenal Mass  Hematoma vs cystic neoplasm  P: -will need biopsy at some point -cortisol, urine metanephrine, urine cortisol, urine catecholamine ordered  PCCM will sign off; available prn   Best Practice (right click and "Reselect all SmartList Selections" daily)  Diet/type: Regular consistency (see orders) DVT prophylaxis: systemic heparin GI prophylaxis: N/A Lines: N/A Foley:  N/A Code Status:  full code Last date of multidisciplinary goals of care discussion: n/a, full code.   Critical care time: 35 minutes      JD Rexene Agent [facility] 02/21/2021, 1:09 PM  Please see [email or web address] for pager details.  From 7A-7P if no response, please call [phone]. After hours, please call ELink [phone].

## 2021-02-22 DIAGNOSIS — N1832 Chronic kidney disease, stage 3b: Secondary | ICD-10-CM

## 2021-02-22 DIAGNOSIS — E278 Other specified disorders of adrenal gland: Secondary | ICD-10-CM

## 2021-02-22 LAB — CBC
HCT: 34.1 % — ABNORMAL LOW (ref 39.0–52.0)
Hemoglobin: 11.1 g/dL — ABNORMAL LOW (ref 13.0–17.0)
MCH: 23.5 pg — ABNORMAL LOW (ref 26.0–34.0)
MCHC: 32.6 g/dL (ref 30.0–36.0)
MCV: 72.2 fL — ABNORMAL LOW (ref 80.0–100.0)
Platelets: 225 10*3/uL (ref 150–400)
RBC: 4.72 MIL/uL (ref 4.22–5.81)
RDW: 15.4 % (ref 11.5–15.5)
WBC: 5.3 10*3/uL (ref 4.0–10.5)
nRBC: 0 % (ref 0.0–0.2)

## 2021-02-22 LAB — BASIC METABOLIC PANEL
Anion gap: 8 (ref 5–15)
BUN: 36 mg/dL — ABNORMAL HIGH (ref 6–20)
CO2: 20 mmol/L — ABNORMAL LOW (ref 22–32)
Calcium: 9.3 mg/dL (ref 8.9–10.3)
Chloride: 103 mmol/L (ref 98–111)
Creatinine, Ser: 3.26 mg/dL — ABNORMAL HIGH (ref 0.61–1.24)
GFR, Estimated: 23 mL/min — ABNORMAL LOW (ref >60)
Glucose, Bld: 147 mg/dL — ABNORMAL HIGH (ref 70–99)
Potassium: 4 mmol/L (ref 3.5–5.1)
Sodium: 131 mmol/L — ABNORMAL LOW (ref 135–145)

## 2021-02-22 LAB — IRON AND TIBC
Iron: 71 ug/dL (ref 45–182)
Saturation Ratios: 21 % (ref 17.9–39.5)
TIBC: 330 ug/dL (ref 250–450)
UIBC: 259 ug/dL

## 2021-02-22 LAB — RETICULOCYTES
Immature Retic Fract: 12.3 % (ref 2.3–15.9)
RBC.: 4.65 MIL/uL (ref 4.22–5.81)
Retic Count, Absolute: 61.8 10*3/uL (ref 19.0–186.0)
Retic Ct Pct: 1.3 % (ref 0.4–3.1)

## 2021-02-22 LAB — FERRITIN: Ferritin: 103 ng/mL (ref 24–336)

## 2021-02-22 LAB — GLUCOSE, CAPILLARY
Glucose-Capillary: 145 mg/dL — ABNORMAL HIGH (ref 70–99)
Glucose-Capillary: 172 mg/dL — ABNORMAL HIGH (ref 70–99)
Glucose-Capillary: 165 mg/dL — ABNORMAL HIGH (ref 70–99)

## 2021-02-22 LAB — FOLATE: Folate: 17.2 ng/mL (ref >5.9)

## 2021-02-22 LAB — MAGNESIUM: Magnesium: 2.2 mg/dL (ref 1.7–2.4)

## 2021-02-22 LAB — NEUROMYELITIS OPTICA AUTOAB, IGG: NMO-IgG: <1.5 U/mL (ref 0.0–3.0)

## 2021-02-22 MED ORDER — INSULIN ASPART 100 UNIT/ML IJ SOLN
0.0000 [IU] | Freq: Three times a day (TID) | INTRAMUSCULAR | Status: DC
  Administered 2021-02-22: 2 [IU] via SUBCUTANEOUS
  Administered 2021-02-23: 3 [IU] via SUBCUTANEOUS
  Administered 2021-02-23 (×2): 2 [IU] via SUBCUTANEOUS
  Administered 2021-02-24: 8 [IU] via SUBCUTANEOUS
  Administered 2021-02-24 (×2): 2 [IU] via SUBCUTANEOUS
  Administered 2021-02-25: 3 [IU] via SUBCUTANEOUS

## 2021-02-22 MED ORDER — SPIRONOLACTONE 25 MG PO TABS
25.0000 mg | ORAL_TABLET | Freq: Every day | ORAL | Status: DC
  Administered 2021-02-22 – 2021-02-23 (×2): 25 mg via ORAL
  Filled 2021-02-22 (×2): qty 1

## 2021-02-22 NOTE — Evaluation (Signed)
Physical Therapy Evaluation Patient Details Name: George Day MRN: [id number] DOB: 1975-12-18 Today's Date: 02/22/2021   History of Present Illness  pt is a 45 y/o male presenting to the ED on 7/3 with blurred vision, R arm and leg numbness with chronic weakness and a headache.  Found to hanve a BP of 213/126.  MRI revealing cervical and T2 lesions suggestive of MS.  PMHx:  Anginal pain, CAD, DM, HTN, MI, Stroke  Clinical Impression  Pt admitted with/for problems stated above.  Pt currently limited functionally due to the problems listed below.  (see problems list.)  Pt will benefit from PT to maximize function and safety to be able to get home safely with available assist.     Follow Up Recommendations No PT follow up    Equipment Recommendations  Other (comment) (pt to purchase a cane himself)    Recommendations for Other Services       Precautions / Restrictions Precautions Precautions: Fall      Mobility  Bed Mobility               General bed mobility comments: pt OOB in the recliner on arrival    Transfers Overall transfer level: Needs assistance   Transfers: Sit to/from Stand;Stand Pivot Transfers Sit to Stand: Independent Stand pivot transfers: Independent          Ambulation/Gait Ambulation/Gait assistance: Independent (in a home-like environment) Gait Distance (Feet): 100 Feet (x2,  1st trial with no AD and 2nd trial with cane) Assistive device: Straight cane Gait Pattern/deviations: Step-through pattern   Gait velocity interpretation: 1.31 - 2.62 ft/sec, indicative of limited community ambulator General Gait Details: pt has a chronically unsteady gait on the right with circumduction swing through instead of toe off pattern, truncal list L to prep for/assist hiking/circumduction.  Adding a cane in his L hand with proper training made a noticeable difference in pt's gait and potential safety.  Stairs            Wheelchair Mobility     Modified Rankin (Stroke Patients Only)       Balance Overall balance assessment: Needs assistance Sitting-balance support: No upper extremity supported;Feet supported Sitting balance-Leahy Scale: Good     Standing balance support: No upper extremity supported;During functional activity Standing balance-Leahy Scale: Fair                               Pertinent Vitals/Pain Pain Assessment: No/denies pain    Home Living Family/patient expects to be discharged to:: Private residence Living Arrangements: Spouse/significant other;Children Available Help at Discharge: Family   Home Access: Stairs to enter     Home Layout: One level Home Equipment: None      Prior Function Level of Independence: Needs assistance;Independent   Gait / Transfers Assistance Needed: works at [employer] at [employer], drives with his L LE.  Pt reports having lost jobs because his R LE is so weak and he can't meet the expectations.  ADL's / Homemaking Assistance Needed: does his own ADL's        Hand Dominance        Extremity/Trunk Assessment   Upper Extremity Assessment Upper Extremity Assessment: RUE deficits/detail;LUE deficits/detail RUE Deficits / Details: notably weaker than L UE, less distal stability LUE Deficits / Details: WFL    Lower Extremity Assessment Lower Extremity Assessment: RLE deficits/detail;LLE deficits/detail RLE Deficits / Details: notably weaker R LE than L LE, hip flexors  2+/5 otherwise grossly 3+/5 RLE Coordination: decreased fine motor       Communication   Communication: No difficulties  Cognition   Behavior During Therapy: WFL for tasks assessed/performed Overall Cognitive Status: Within Functional Limits for tasks assessed                                        General Comments General comments (skin integrity, edema, etc.): A cane did significantly improve pt's gait and confidence.    Exercises     Assessment/Plan     PT Assessment Patient needs continued PT services  PT Problem List Decreased strength;Decreased balance;Decreased mobility;Decreased coordination;Decreased knowledge of use of DME       PT Treatment Interventions      PT Goals (Current goals can be found in the Care Plan section)  Acute Rehab PT Goals Patient Stated Goal: get a cane to make my walking safer PT Goal Formulation: With patient Time For Goal Achievement: 02/24/21 Potential to Achieve Goals: Good    Frequency Min 3X/week   Barriers to discharge        Co-evaluation               AM-PAC PT "6 Clicks" Mobility  Outcome Measure Help needed turning from your back to your side while in a flat bed without using bedrails?: None Help needed moving from lying on your back to sitting on the side of a flat bed without using bedrails?: None Help needed moving to and from a bed to a chair (including a wheelchair)?: None Help needed standing up from a chair using your arms (e.g., wheelchair or bedside chair)?: None Help needed to walk in hospital room?: None Help needed climbing 3-5 steps with a railing? : A Little 6 Click Score: 23    End of Session   Activity Tolerance: Patient tolerated treatment well Patient left: in chair;with call bell/phone within reach Nurse Communication: Mobility status PT Visit Diagnosis: Unsteadiness on feet (R26.81)    Time: 7106-2694 PT Time Calculation (min) (ACUTE ONLY): 31 min   Charges:   PT Evaluation $PT Eval Low Complexity: 1 Low PT Treatments $Gait Training: 8-22 mins        02/22/2021  Ginger Carne., PT Acute Rehabilitation Services [phone]  (pager) [phone]  (office)  Tessie Fass Mckenzi Buonomo 02/22/2021, 6:27 PM

## 2021-02-22 NOTE — Progress Notes (Addendum)
PROGRESS NOTE    Eliu Batch   ZRA:[id number]  DOB: Mar 08, 1976  DOA: 02/19/2021 PCP: Ladell Pier, MD   Brief Narrative:  Demonte Dobratz is a 45 year old male with hypertension, CKD stage III, coronary artery disease, recent diagnosis of right adrenal mass (1 visit with endocrinology so far) who presented to the ED on 02/19/2021 with blurred vision, right arm and leg left-sided arm numbness and a headache and was found to have a blood pressure of 213/126.  He admitted to not being compliant with his medications and had not been taking his carvedilol and hydrochlorothiazide.  He was placed on a Cardene infusion in the ED in addition to oral antihypertensives.  Neurology was also consulted.  The patient underwent MRI of head and neck.  A C-spine showed true cervical spinal cord lesions suggestive of demyelinating disease.  The patient was admitted by the critical care team. On 7/4, MRI T-spine orbits without contrast was ordered. On 7/5, the patient continued to have paresthesias although his vision and headache had improved.  T-spine MRI revealed a lesion at T2.  Neurology felt the patient may have multiple sclerosis recommended to start Solu-Medrol 1 g daily. - Nicardipine had been weaned off and oral medications adjusted further. -24-hour urine collection started by the critical care team to rule out pheochromocytoma  Subjective: No new complaints.    Assessment & Plan:   Principal Problem:   Hypertensive crisis with acute renal failure, blurred vision and headache Chronic diastolic heart failure grade 2  Right adrenal mass -Continue carvedilol and amlodipine-change HCTZ to spironolactone due to hypokalemia -2D echo in 12/21 revealed grade 2 diastolic dysfunction, EF 50 to 55% -24-hour urine collection will be completed this evening -Random cortisol 10.7 on 7/5 - BP has improved to systolic between 545-625 and diastolic between 63-89 -He can continue outpatient work-up with  endocrinology  Active Problems:   AKI-stage 3b chronic kidney disease with acute metabolic acidosis -Creatinine baseline appears to be 2.2-2.7 - Creatinine 2.93 on admission rose to 3.39-has not improved significantly yet-follow    Hypokalemia -He has been hypokalemic since admission 1 potassium was 3.2 -I will stop HCTZ today and start spironolactone -has been replaced-continue to follow  Possible multiple sclerosis - Started on Solu-Medrol 1 g daily on 7/5  Glucose intolerance - A1c 5.9-continue diet control-outpatient follow-up with endocrinology -Start checking sugars and place on sliding scale insulin while on Solu-Medrol  Microcytic anemia - Check iron panel  Urine drug screen positive for tetrahydrocannabinol  Time spent in minutes: 35 DVT prophylaxis: heparin injection 5,000 Units Start: 02/19/21 1400 SCDs Start: 02/19/21 0958 Code Status: Full code Family Communication:  Level of Care: Level of care: Progressive Disposition Plan:  Status is: Inpatient  Remains inpatient appropriate because:IV treatments appropriate due to intensity of illness or inability to take PO  Dispo: The patient is from: Home              Anticipated d/c is to: Home              Patient currently is not medically stable to d/c.   Difficult to place patient No      Consultants:  Pulmonary critical care Neurology Procedures:  None Antimicrobials:  Anti-infectives (From admission, onward)    None        Objective: Vitals:   02/22/21 1030 02/22/21 1050 02/22/21 1100 02/22/21 1200  BP: (!) 170/98 (!) 169/102 (!) 166/94 (!) 156/100  Pulse:      Resp: 18 (!)  23 18 (!) 24  Temp:      TempSrc:      SpO2:   100% 99%  Weight:        Intake/Output Summary (Last 24 hours) at 02/22/2021 1255 Last data filed at 02/21/2021 2200 Gross per 24 hour  Intake 468.16 ml  Output 600 ml  Net -131.84 ml   Filed Weights   02/20/21 0500 02/21/21 0213 02/22/21 0500  Weight: 97.1 kg 99.5  kg 99.4 kg    Examination: General exam: Appears comfortable  HEENT: PERRLA, oral mucosa moist, no sclera icterus or thrush Respiratory system: Clear to auscultation. Respiratory effort normal. Cardiovascular system: S1 & S2 heard, RRR.   Gastrointestinal system: Abdomen soft, non-tender, nondistended. Normal bowel sounds. Central nervous system: Alert and oriented. No focal neurological deficits. Extremities: No cyanosis, clubbing or edema Skin: No rashes or ulcers Psychiatry:  Mood & affect appropriate.     Data Reviewed: I have personally reviewed following labs and imaging studies  CBC: Recent Labs  Lab 02/19/21 0335 02/19/21 0350 02/20/21 0007 02/21/21 0021 02/22/21 0147  WBC 6.6  --  6.4 5.5 5.3  NEUTROABS 4.2  --   --   --   --   HGB 12.3* 13.6 12.6* 10.8* 11.1*  HCT 37.5* 40.0 37.4* 33.3* 34.1*  MCV 72.4*  --  70.7* 71.8* 72.2*  PLT 219  --  246 229 557   Basic Metabolic Panel: Recent Labs  Lab 02/19/21 0335 02/19/21 0350 02/20/21 0007 02/21/21 0021 02/21/21 1307 02/22/21 0147  NA 136 140 133* 132* 131* 131*  K 3.0* 3.0* 3.2* 3.2* 3.9 4.0  CL 105 104 106 101 101 103  CO2 23  --  21* 24 22 20*  GLUCOSE 115* 112* 122* 113* 95 147*  BUN 27* 28* 28* 32* 31* 36*  CREATININE 2.93* 3.20* 3.02* 3.33* 3.39* 3.26*  CALCIUM 9.0  --  8.6* 8.8* 8.9 9.3  MG  --   --  2.1  --  2.3 2.2  PHOS  --   --  2.9  --   --   --    GFR: Estimated Creatinine Clearance: 33.3 mL/min (A) (by C-G formula based on SCr of 3.26 mg/dL (H)). Liver Function Tests: Recent Labs  Lab 02/19/21 0335  AST 32  ALT 27  ALKPHOS 77  BILITOT 1.1  PROT 7.5  ALBUMIN 3.4*   No results for input(s): LIPASE, AMYLASE in the last 168 hours. No results for input(s): AMMONIA in the last 168 hours. Coagulation Profile: Recent Labs  Lab 02/19/21 0335  INR 1.0   Cardiac Enzymes: No results for input(s): CKTOTAL, CKMB, CKMBINDEX, TROPONINI in the last 168 hours. BNP (last 3 results) No  results for input(s): PROBNP in the last 8760 hours. HbA1C: Recent Labs    02/19/21 1402  HGBA1C 5.9*   CBG: No results for input(s): GLUCAP in the last 168 hours. Lipid Profile: No results for input(s): CHOL, HDL, LDLCALC, TRIG, CHOLHDL, LDLDIRECT in the last 72 hours. Thyroid Function Tests: No results for input(s): TSH, T4TOTAL, FREET4, T3FREE, THYROIDAB in the last 72 hours. Anemia Panel: No results for input(s): VITAMINB12, FOLATE, FERRITIN, TIBC, IRON, RETICCTPCT in the last 72 hours. Urine analysis:    Component Value Date/Time   COLORURINE STRAW (A) 02/19/2021 0330   APPEARANCEUR CLEAR 02/19/2021 0330   LABSPEC 1.009 02/19/2021 0330   PHURINE 8.0 02/19/2021 0330   GLUCOSEU 50 (A) 02/19/2021 0330   HGBUR SMALL (A) 02/19/2021 0330   BILIRUBINUR NEGATIVE  02/19/2021 0330   KETONESUR NEGATIVE 02/19/2021 0330   PROTEINUR 100 (A) 02/19/2021 0330   NITRITE NEGATIVE 02/19/2021 0330   LEUKOCYTESUR NEGATIVE 02/19/2021 0330   Sepsis Labs: @LABRCNTIP (procalcitonin:4,lacticidven:4) ) Recent Results (from the past 240 hour(s))  Resp Panel by RT-PCR (Flu A&B, Covid) Nasopharyngeal Swab     Status: None   Collection Time: 02/19/21  4:36 AM   Specimen: Nasopharyngeal Swab; Nasopharyngeal(NP) swabs in vial transport medium  Result Value Ref Range Status   SARS Coronavirus 2 by RT PCR NEGATIVE NEGATIVE Final    Comment: (NOTE) SARS-CoV-2 target nucleic acids are NOT DETECTED.  The SARS-CoV-2 RNA is generally detectable in upper respiratory specimens during the acute phase of infection. The lowest concentration of SARS-CoV-2 viral copies this assay can detect is 138 copies/mL. A negative result does not preclude SARS-Cov-2 infection and should not be used as the sole basis for treatment or other patient management decisions. A negative result may occur with  improper specimen collection/handling, submission of specimen other than nasopharyngeal swab, presence of viral mutation(s)  within the areas targeted by this assay, and inadequate number of viral copies(<138 copies/mL). A negative result must be combined with clinical observations, patient history, and epidemiological information. The expected result is Negative.  Fact Sheet for Patients:  EntrepreneurPulse.com.au  Fact Sheet for Healthcare Providers:  IncredibleEmployment.be  This test is no t yet approved or cleared by the [place] FDA and  has been authorized for detection and/or diagnosis of SARS-CoV-2 by FDA under an Emergency Use Authorization (EUA). This EUA will remain  in effect (meaning this test can be used) for the duration of the COVID-19 declaration under Section 564(b)(1) of the Act, 21 U.S.C.section 360bbb-3(b)(1), unless the authorization is terminated  or revoked sooner.       Influenza A by PCR NEGATIVE NEGATIVE Final   Influenza B by PCR NEGATIVE NEGATIVE Final    Comment: (NOTE) The Xpert Xpress SARS-CoV-2/FLU/RSV plus assay is intended as an aid in the diagnosis of influenza from Nasopharyngeal swab specimens and should not be used as a sole basis for treatment. Nasal washings and aspirates are unacceptable for Xpert Xpress SARS-CoV-2/FLU/RSV testing.  Fact Sheet for Patients: EntrepreneurPulse.com.au  Fact Sheet for Healthcare Providers: IncredibleEmployment.be  This test is not yet approved or cleared by the [place] FDA and has been authorized for detection and/or diagnosis of SARS-CoV-2 by FDA under an Emergency Use Authorization (EUA). This EUA will remain in effect (meaning this test can be used) for the duration of the COVID-19 declaration under Section 564(b)(1) of the Act, 21 U.S.C. section 360bbb-3(b)(1), unless the authorization is terminated or revoked.  Performed at [facility], [place] [address] [place], [place] [address]   MRSA Next Gen by PCR, Nasal     Status: None    Collection Time: 02/19/21  8:00 PM   Specimen: Nasal Mucosa; Nasal Swab  Result Value Ref Range Status   MRSA by PCR Next Gen NOT DETECTED NOT DETECTED Final    Comment: (NOTE) The GeneXpert MRSA Assay (FDA approved for NASAL specimens only), is one component of a comprehensive MRSA colonization surveillance program. It is not intended to diagnose MRSA infection nor to guide or monitor treatment for MRSA infections. Test performance is not FDA approved in patients less than 101 years old. Performed at [facility], [place] [address] [place], [place] [address]          Radiology Studies: No results found.    Scheduled Meds:  amLODipine  10 mg Oral Daily   carvedilol  25 mg Oral BID WC   Chlorhexidine Gluconate Cloth  6 each Topical Q0600   heparin  5,000 Units Subcutaneous Q8H   hydrochlorothiazide  25 mg Oral Daily   pantoprazole (PROTONIX) IV  40 mg Intravenous Q24H   Continuous Infusions:  methylPREDNISolone (SOLU-MEDROL) injection Stopped (02/21/21 2049)   niCARDipine Stopped (02/21/21 1949)     LOS: 3 days      Debbe Odea, MD [facility] Pager: [email or web address] 02/22/2021, 12:55 PM

## 2021-02-23 ENCOUNTER — Other Ambulatory Visit: Payer: Self-pay

## 2021-02-23 ENCOUNTER — Encounter (HOSPITAL_COMMUNITY): Payer: Self-pay | Admitting: Internal Medicine

## 2021-02-23 ENCOUNTER — Inpatient Hospital Stay (HOSPITAL_COMMUNITY): Payer: Self-pay

## 2021-02-23 LAB — URINALYSIS, ROUTINE W REFLEX MICROSCOPIC
Bacteria, UA: NONE SEEN
Bilirubin Urine: NEGATIVE
Glucose, UA: NEGATIVE mg/dL
Hgb urine dipstick: NEGATIVE
Ketones, ur: NEGATIVE mg/dL
Leukocytes,Ua: NEGATIVE
Nitrite: NEGATIVE
Protein, ur: 30 mg/dL — AB
Specific Gravity, Urine: 1.015 (ref 1.005–1.030)
pH: 5 (ref 5.0–8.0)

## 2021-02-23 LAB — BASIC METABOLIC PANEL
Anion gap: 11 (ref 5–15)
BUN: 45 mg/dL — ABNORMAL HIGH (ref 6–20)
CO2: 20 mmol/L — ABNORMAL LOW (ref 22–32)
Calcium: 9.3 mg/dL (ref 8.9–10.3)
Chloride: 103 mmol/L (ref 98–111)
Creatinine, Ser: 3.43 mg/dL — ABNORMAL HIGH (ref 0.61–1.24)
GFR, Estimated: 22 mL/min — ABNORMAL LOW (ref >60)
Glucose, Bld: 147 mg/dL — ABNORMAL HIGH (ref 70–99)
Potassium: 4.1 mmol/L (ref 3.5–5.1)
Sodium: 134 mmol/L — ABNORMAL LOW (ref 135–145)

## 2021-02-23 LAB — VITAMIN B12: Vitamin B-12: 660 pg/mL (ref 180–914)

## 2021-02-23 LAB — GLUCOSE, CAPILLARY
Glucose-Capillary: 110 mg/dL — ABNORMAL HIGH (ref 70–99)
Glucose-Capillary: 127 mg/dL — ABNORMAL HIGH (ref 70–99)
Glucose-Capillary: 139 mg/dL — ABNORMAL HIGH (ref 70–99)
Glucose-Capillary: 179 mg/dL — ABNORMAL HIGH (ref 70–99)
Glucose-Capillary: 136 mg/dL — ABNORMAL HIGH (ref 70–99)
Glucose-Capillary: 157 mg/dL — ABNORMAL HIGH (ref 70–99)

## 2021-02-23 LAB — TSH: TSH: 0.482 u[IU]/mL (ref 0.350–4.500)

## 2021-02-23 LAB — VITAMIN D 25 HYDROXY (VIT D DEFICIENCY, FRACTURES): Vit D, 25-Hydroxy: 17.58 ng/mL — ABNORMAL LOW (ref 30–100)

## 2021-02-23 LAB — SODIUM, URINE, RANDOM: Sodium, Ur: 52 mmol/L

## 2021-02-23 MED ORDER — COVID-19 MRNA VACC (MODERNA) 50 MCG/0.25ML IM SUSP
0.2500 mL | Freq: Once | INTRAMUSCULAR | Status: AC
  Administered 2021-02-23: 0.25 mL via INTRAMUSCULAR
  Filled 2021-02-23: qty 0.25

## 2021-02-23 MED ORDER — HYDRALAZINE HCL 10 MG PO TABS
10.0000 mg | ORAL_TABLET | Freq: Three times a day (TID) | ORAL | Status: DC
  Administered 2021-02-23 – 2021-02-25 (×6): 10 mg via ORAL
  Filled 2021-02-23 (×6): qty 1

## 2021-02-23 MED ORDER — PANTOPRAZOLE SODIUM 40 MG PO TBEC
40.0000 mg | DELAYED_RELEASE_TABLET | Freq: Every day | ORAL | Status: DC
  Administered 2021-02-23 – 2021-02-24 (×2): 40 mg via ORAL
  Filled 2021-02-23 (×2): qty 1

## 2021-02-23 NOTE — Consult Note (Signed)
Nephrology Consult   Requesting provider: Dr. Broadus John Service requesting consult: [facility] Reason for consult: AKI   Assessment/Recommendations: George Day is a/an 45 y.o. male with a past medical history of hypertension, CKD 3 who presented to the hospital for severe symptomatic hypotension, which required nicardipine drip, and transition to p.o. medication.  Also found to have multiple sclerosis on imaging, which Solu-Medrol was started by neurology.  Nephrology was consulted for AKI.   Non-Oliguric AKI  CKD 3B Baseline creatinine around 2.9.  His worsening serum creatinine is likely 2/2 addition of HCTZ in the setting of longstanding uncontrolled hypertension.  Low suspicion for ATN or AIN.  No evidence of renal emboli or cardiorenal.  Patient still has good urine output.   -Obtain renal ultrasound to rule out obstruction given his right CVA tenderness -Obtain urine sodium and UA to check for sediments -Hold spironolactone and start hydralazine -Avoid further nephrotoxins including NSAIDS, Morphine.  Unless absolutely necessary, avoid CT with contrast and/or MRI with gadolinium.    -Continue to monitor daily Cr, Dose meds for GFR<15 -Monitor Daily I/Os, Daily weight  -No indication for emergent HD   Severe symptomatic hypertension Likely secondary to medication non-adherence.  Unsure contribution from right adrenal mass.  Aldo/renin ratio was normal.  Pending pheochromocytoma labs. -Also check TSH -Continue amlodipine and carvedilol -Hold spironolactone and start hydralazine  Metabolic acidosis In the setting of kidney dysfunction -Hold off on starting sodium bicarb to reduce sodium load  Multiple sclerosis Solu-Medrol per neurology  Recommendations conveyed to primary service.    [facility] 02/23/2021 1:26 PM   _____________________________________________________________________________________   History of Present Illness:  George Day is a/an 45 y.o. male with a past medical history of hypertension, CKD 3, CAD, right adrenal mass, who presented to the hospital for severe symptomatic hypertension.  Initial blood pressure 213/126, he was started on nicardipine drip in the ICU.  Patient also had neurological deficit with right sided weakness, blurred vision and headache.  Cervical MRI showed evidence of demyelinating disease, which she was started on Solu-Medrol.  Nephrology is consulted for AKI.  Patient is sitting on reclining chair during examination.  He appears comfortable and in no acute distress.  Patient reports right flank pain with urination that started about 3 months ago.  Pain comes and go, described pain as sharp, also worse with lying on the right side.  He denies abdominal pain or dysuria.  Endorses persistent right leg weakness.  Patient states that he had a CT abdomen 3 weeks ago which showed a right adrenal mass.  Since then he was confused of his blood pressure medications.  He was supposed to be on amlodipine, carvedilol, hydralazine and chlorthalidone.  Patient has not taking any of his medications in the last 3 months except for hydralazine, which he only takes 1 time a day instead of 3.  Medications:  Current Facility-Administered Medications  Medication Dose Route Frequency Provider Last Rate Last Admin   amLODipine (NORVASC) tablet 10 mg  10 mg Oral Daily Ollis, Brandi L, NP   10 mg at 02/23/21 1026   carvedilol (COREG) tablet 25 mg  25 mg Oral BID WC Jacky Kindle, MD   25 mg at 02/23/21 0759   Chlorhexidine Gluconate Cloth 2 % PADS 6 each  6 each Topical Q0600 Jacky Kindle, MD   6 each at 02/22/21 1000   COVID-19 mRNA vaccine (Moderna) injection 0.25 mL  0.25 mL Intramuscular Once Domenic Polite, MD  docusate sodium (COLACE) capsule 100 mg  100 mg Oral BID PRN Noe Gens L, NP       heparin injection 5,000 Units  5,000 Units Subcutaneous Q8H Ollis, Brandi L, NP   5,000 Units at  02/23/21 0645   insulin aspart (novoLOG) injection 0-15 Units  0-15 Units Subcutaneous TID WC Debbe Odea, MD   3 Units at 02/23/21 1142   labetalol (NORMODYNE) injection 10 mg  10 mg Intravenous Q2H PRN Vianne Bulls, MD   10 mg at 02/19/21 0948   methylPREDNISolone sodium succinate (SOLU-MEDROL) 1,000 mg in sodium chloride 0.9 % 50 mL IVPB  1,000 mg Intravenous Q24H Greta Doom, MD   Stopped at 02/22/21 1938   ondansetron (ZOFRAN) injection 4 mg  4 mg Intravenous Q6H PRN Noe Gens L, NP   4 mg at 02/20/21 0139   pantoprazole (PROTONIX) EC tablet 40 mg  40 mg Oral Q1200 Domenic Polite, MD       polyethylene glycol (MIRALAX / GLYCOLAX) packet 17 g  17 g Oral Daily PRN Noe Gens L, NP       spironolactone (ALDACTONE) tablet 25 mg  25 mg Oral Daily Debbe Odea, MD   25 mg at 02/23/21 1026     ALLERGIES Penicillins and Tomato  MEDICAL HISTORY Past Medical History:  Diagnosis Date   Anemia 1998   Anginal pain ([place])    Anxiety    Asthma    Coronary artery disease    Diabetes mellitus without complication ([place]) 8527   Hypertension 2019   MI (myocardial infarction) ([place]) 2010   Stroke [facility]) 2010     SOCIAL HISTORY Social History   Socioeconomic History   Marital status: Single    Spouse name: Not on file   Number of children: 3   Years of education: Not on file   Highest education level: Not on file  Occupational History   Not on file  Tobacco Use   Smoking status: Some Days    Packs/day: 0.50    Years: 31.00    Pack years: 15.50    Types: Cigars, E-cigarettes, Cigarettes   Smokeless tobacco: Never  Vaping Use   Vaping Use: Never used  Substance and Sexual Activity   Alcohol use: Yes    Comment: occasioanlly    Drug use: Never   Sexual activity: Yes    Birth control/protection: None  Other Topics Concern   Not on file  Social History Narrative   Not on file   Social Determinants of Health   Financial Resource Strain: Not on file  Food  Insecurity: Not on file  Transportation Needs: Not on file  Physical Activity: Not on file  Stress: Not on file  Social Connections: Not on file  Intimate Partner Violence: Not on file     FAMILY HISTORY Family History  Problem Relation Age of Onset   Diabetes Mother    Hyperlipidemia Mother    Diabetes Sister    Hyperlipidemia Sister    Hearing loss Sister    Diabetes Brother    Hyperlipidemia Brother      No family history of kidney disease  Review of Systems: 12 systems reviewed Otherwise as per HPI, all other systems reviewed and negative  Physical Exam: Vitals:   02/23/21 1000 02/23/21 1100  BP: (!) 165/93 (!) 155/101  Pulse:    Resp: 14 (!) 22  Temp:  97.6 F (36.4 C)  SpO2: 99% 98%   Total I/O In: 660 [P.O.:660] Out:  425 [Urine:425]  Intake/Output Summary (Last 24 hours) at 02/23/2021 1326 Last data filed at 02/23/2021 1100 Gross per 24 hour  Intake 717.93 ml  Output 1425 ml  Net -707.07 ml   General: well-appearing, no acute distress HEENT: anicteric sclera, oropharynx clear without lesions CV: regular rate, normal rhythm, no murmurs, no gallops, no rubs.  Trace edema bilateral LE.  Lungs: clear to auscultation bilaterally, normal work of breathing Abd: soft, non-tender, non-distended.  Mild tenderness to palpation in the right CVA Skin: no visible lesions or rashes Psych: alert, engaged, appropriate mood and affect Musculoskeletal: no obvious deformities.  +2 pedis pulses palpated Neuro: normal speech, no gross focal deficits   Test Results Reviewed Lab Results  Component Value Date   NA 134 (L) 02/23/2021   K 4.1 02/23/2021   CL 103 02/23/2021   CO2 20 (L) 02/23/2021   BUN 45 (H) 02/23/2021   CREATININE 3.43 (H) 02/23/2021   GFR 25.35 (L) 02/15/2021   CALCIUM 9.3 02/23/2021   ALBUMIN 3.4 (L) 02/19/2021   PHOS 2.9 02/20/2021     I have reviewed all relevant outside healthcare records related to the patient's kidney injury.

## 2021-02-23 NOTE — Progress Notes (Signed)
Subjective: Feels some improvement   Exam: Vitals:   02/23/21 0759 02/23/21 0800  BP: 127/68 (!) 158/86  Pulse: 81   Resp:  19  Temp:    SpO2:  99%   Gen: In bed, NAD Resp: non-labored breathing, no acute distress Abd: soft, nt  Neuro: MS: awake, alert interactive and appropriate CN:VFF, EOMI Motor: 5/5 in BUE, he has 4/5 weakness in the RLE Sensory:Decreased to LT and temp in the right leg.  DTR:3+ in the right patella, 2+ in the left.   Pertinent Labs: Cr 3.43   Impression: 45 yo M with bilateral paresthesias, blurred vision, hypertensive emergency.  His vision I suspect was secondary to the hypertension.  His bilateral paresthesias are most likely result of the cervical spine lesion.  Given the new onset and the presence of the T2 lesion on MRI I think that this merits steroids.    He has had previous clinical episodes which I think are referable to the thoracic lesions, making this by far most consistent with multiple sclerosis.  Most of his brain lesions are more consistent with small vessel disease, but there is a periventricular lesion that I think could be MS related.  At this time, I think that multiple sclerosis is by far the most likely diagnosis.  With the presence of multiple lesions, I think that spinal cord ischemia is essentially ruled out.  Recommendations: 1) Solumedrol, day 3/5 2) neurology will continue to follow   Roland Rack, MD [facility] [phone]  If 7pm- 7am, please page neurology on call as listed in [place].

## 2021-02-23 NOTE — Progress Notes (Signed)
PROGRESS NOTE    George Day   LNL:[id number]  DOB: 01-08-1976  DOA: 02/19/2021 PCP: Ladell Pier, MD   Brief Narrative:  George Day is a 45 year old male with hypertension, CKD stage III, coronary artery disease, recent diagnosis of right adrenal mass (1 visit with endocrinology so far) who presented to the ED on 02/19/2021 with blurred vision, right arm and leg left-sided arm numbness and a headache and was found to have a blood pressure of 213/126.  He admitted to not being compliant with his medications and had not been taking his carvedilol and hydrochlorothiazide.  He was placed on a Cardene infusion in the ED in addition to oral antihypertensives.  Neurology was also consulted.  The patient underwent MRI of head and neck.  A C-spine showed true cervical spinal cord lesions suggestive of demyelinating disease.  He was admitted to the ICU and placed on nicardipine infusion. On 7/5, the patient continued to have paresthesias although his vision and headache had improved.  T-spine MRI revealed a lesion at T2.  Neurology felt the patient may have multiple sclerosis recommended to start Solu-Medrol 1 g daily. - Nicardipine had been weaned off and oral medications adjusted further. -24-hour urine collection started by the critical care team to rule out pheochromocytoma  Subjective: -Feels better today, blurring of vision is improving overall, mild paresthesias   Assessment & Plan:   Hypertensive crisis with acute renal failure, blurred vision and headache Chronic diastolic heart failure grade 2 7cm Right adrenal mass -Required nicardipine drip on admission, now off  -continue carvedilol and amlodipine, HCTZ was changed to Aldactone yesterday due to recurrent  -2D echo in 12/21 revealed grade 2 diastolic dysfunction, EF 50 to 55% -Random cortisol 10.7 on 7/5, renin aldosterone ratio was normal in June -Plasma metanephrines, catecholamines from 6/29, cannot access, repeat  pending -UDS was positive for cannabis only on admission -He is followed by endocrinology Dr. Kelton Pillar was recently referred to surgeon for adrenal mass excision due to its size   AKI-stage 3b chronic kidney disease with acute metabolic acidosis -Creatinine baseline appears to be 2.2-2.6 - Creatinine 2.93 on admission, trending up to 3.4 now, likely hemodynamically mediated with blood pressure changes, hypertensive urgency -Urine output is good -Will request nephrology input as well, urinalysis with 100+ proteinuria  Possible multiple sclerosis -History of waxing and waning neurological symptoms for years, although his visual changes could be secondary to hypertension his paresthesias was suspected to be related to his cervical spine lesion -Abnormal MRI C-spine without contrast -Appreciate neurology input -Continue IV Solu-Medrol day 3/5 today -Continue PPI -Also check vitamin D  Borderline diabetes  - A1c 5.9-continue diet control-outpatient follow-up with endocrinology -Start checking sugars and place on sliding scale insulin while on Solu-Medrol  Microcytic anemia - Check iron panel  Urine drug screen positive for tetrahydrocannabinol  Time spent in minutes: 35 DVT prophylaxis: heparin  SQ Code Status: Full code Family Communication:  Disposition Plan:  Status is: Inpatient  Remains inpatient appropriate because:IV treatments appropriate due to intensity of illness or inability to take PO  Dispo: The patient is from: Home              Anticipated d/c is to: Home              Patient currently is not medically stable to d/c.   Difficult to place patient No      Consultants:  Pulmonary critical care Neurology Procedures:  None Antimicrobials:  Anti-infectives (From admission, onward)  None        Objective: Vitals:   02/23/21 0700 02/23/21 0759 02/23/21 0800 02/23/21 1100  BP:  127/68 (!) 158/86   Pulse:  81    Resp: (!) 23  19   Temp:    97.6 F  (36.4 C)  TempSrc:      SpO2: 96%  99%   Weight:        Intake/Output Summary (Last 24 hours) at 02/23/2021 1137 Last data filed at 02/23/2021 0800 Gross per 24 hour  Intake 517.93 ml  Output 1425 ml  Net -907.07 ml   Filed Weights   02/21/21 0213 02/22/21 0500 02/23/21 0500  Weight: 99.5 kg 99.4 kg 96.7 kg    Examination: General exam: Pleasant male sitting up in bed AAOx3, no distress HEENT: No JVD CVS: S1-S2, regular rate rhythm Lungs: Clear bilaterally Abdomen: Soft, nontender, bowel sounds present Extremities: No edema Neuro: Moves all extremities, has mild right lower extremity weakness 4/5 rest is normal, sensory decreased light touch in the right leg  Skin: No rashes or ulcers Psychiatry:  Mood & affect appropriate.     Data Reviewed: I have personally reviewed following labs and imaging studies  CBC: Recent Labs  Lab 02/19/21 0335 02/19/21 0350 02/20/21 0007 02/21/21 0021 02/22/21 0147  WBC 6.6  --  6.4 5.5 5.3  NEUTROABS 4.2  --   --   --   --   HGB 12.3* 13.6 12.6* 10.8* 11.1*  HCT 37.5* 40.0 37.4* 33.3* 34.1*  MCV 72.4*  --  70.7* 71.8* 72.2*  PLT 219  --  246 229 144   Basic Metabolic Panel: Recent Labs  Lab 02/20/21 0007 02/21/21 0021 02/21/21 1307 02/22/21 0147 02/23/21 0322  NA 133* 132* 131* 131* 134*  K 3.2* 3.2* 3.9 4.0 4.1  CL 106 101 101 103 103  CO2 21* 24 22 20* 20*  GLUCOSE 122* 113* 95 147* 147*  BUN 28* 32* 31* 36* 45*  CREATININE 3.02* 3.33* 3.39* 3.26* 3.43*  CALCIUM 8.6* 8.8* 8.9 9.3 9.3  MG 2.1  --  2.3 2.2  --   PHOS 2.9  --   --   --   --    GFR: Estimated Creatinine Clearance: 31.2 mL/min (A) (by C-G formula based on SCr of 3.43 mg/dL (H)). Liver Function Tests: Recent Labs  Lab 02/19/21 0335  AST 32  ALT 27  ALKPHOS 77  BILITOT 1.1  PROT 7.5  ALBUMIN 3.4*   No results for input(s): LIPASE, AMYLASE in the last 168 hours. No results for input(s): AMMONIA in the last 168 hours. Coagulation Profile: Recent  Labs  Lab 02/19/21 0335  INR 1.0   Cardiac Enzymes: No results for input(s): CKTOTAL, CKMB, CKMBINDEX, TROPONINI in the last 168 hours. BNP (last 3 results) No results for input(s): PROBNP in the last 8760 hours. HbA1C: No results for input(s): HGBA1C in the last 72 hours.  CBG: Recent Labs  Lab 02/22/21 1607 02/22/21 1932 02/22/21 2119 02/23/21 0652 02/23/21 1115  GLUCAP 145* 172* 165* 139* 179*   Lipid Profile: No results for input(s): CHOL, HDL, LDLCALC, TRIG, CHOLHDL, LDLDIRECT in the last 72 hours. Thyroid Function Tests: No results for input(s): TSH, T4TOTAL, FREET4, T3FREE, THYROIDAB in the last 72 hours. Anemia Panel: Recent Labs    02/22/21 1446 02/23/21 0322  VITAMINB12  --  660  FOLATE 17.2  --   FERRITIN 103  --   TIBC 330  --   IRON 71  --  RETICCTPCT 1.3  --    Urine analysis:    Component Value Date/Time   COLORURINE STRAW (A) 02/19/2021 0330   APPEARANCEUR CLEAR 02/19/2021 0330   LABSPEC 1.009 02/19/2021 0330   PHURINE 8.0 02/19/2021 0330   GLUCOSEU 50 (A) 02/19/2021 0330   HGBUR SMALL (A) 02/19/2021 0330   BILIRUBINUR NEGATIVE 02/19/2021 0330   KETONESUR NEGATIVE 02/19/2021 0330   PROTEINUR 100 (A) 02/19/2021 0330   NITRITE NEGATIVE 02/19/2021 0330   LEUKOCYTESUR NEGATIVE 02/19/2021 0330   Sepsis Labs: @LABRCNTIP (procalcitonin:4,lacticidven:4) ) Recent Results (from the past 240 hour(s))  Resp Panel by RT-PCR (Flu A&B, Covid) Nasopharyngeal Swab     Status: None   Collection Time: 02/19/21  4:36 AM   Specimen: Nasopharyngeal Swab; Nasopharyngeal(NP) swabs in vial transport medium  Result Value Ref Range Status   SARS Coronavirus 2 by RT PCR NEGATIVE NEGATIVE Final    Comment: (NOTE) SARS-CoV-2 target nucleic acids are NOT DETECTED.  The SARS-CoV-2 RNA is generally detectable in upper respiratory specimens during the acute phase of infection. The lowest concentration of SARS-CoV-2 viral copies this assay can detect is 138 copies/mL.  A negative result does not preclude SARS-Cov-2 infection and should not be used as the sole basis for treatment or other patient management decisions. A negative result may occur with  improper specimen collection/handling, submission of specimen other than nasopharyngeal swab, presence of viral mutation(s) within the areas targeted by this assay, and inadequate number of viral copies(<138 copies/mL). A negative result must be combined with clinical observations, patient history, and epidemiological information. The expected result is Negative.  Fact Sheet for Patients:  EntrepreneurPulse.com.au  Fact Sheet for Healthcare Providers:  IncredibleEmployment.be  This test is no t yet approved or cleared by the [place] FDA and  has been authorized for detection and/or diagnosis of SARS-CoV-2 by FDA under an Emergency Use Authorization (EUA). This EUA will remain  in effect (meaning this test can be used) for the duration of the COVID-19 declaration under Section 564(b)(1) of the Act, 21 U.S.C.section 360bbb-3(b)(1), unless the authorization is terminated  or revoked sooner.       Influenza A by PCR NEGATIVE NEGATIVE Final   Influenza B by PCR NEGATIVE NEGATIVE Final    Comment: (NOTE) The Xpert Xpress SARS-CoV-2/FLU/RSV plus assay is intended as an aid in the diagnosis of influenza from Nasopharyngeal swab specimens and should not be used as a sole basis for treatment. Nasal washings and aspirates are unacceptable for Xpert Xpress SARS-CoV-2/FLU/RSV testing.  Fact Sheet for Patients: EntrepreneurPulse.com.au  Fact Sheet for Healthcare Providers: IncredibleEmployment.be  This test is not yet approved or cleared by the [place] FDA and has been authorized for detection and/or diagnosis of SARS-CoV-2 by FDA under an Emergency Use Authorization (EUA). This EUA will remain in effect (meaning this test  can be used) for the duration of the COVID-19 declaration under Section 564(b)(1) of the Act, 21 U.S.C. section 360bbb-3(b)(1), unless the authorization is terminated or revoked.  Performed at [facility], [place] [address] [place],  [address]   MRSA Next Gen by PCR, Nasal     Status: None   Collection Time: 02/19/21  8:00 PM   Specimen: Nasal Mucosa; Nasal Swab  Result Value Ref Range Status   MRSA by PCR Next Gen NOT DETECTED NOT DETECTED Final    Comment: (NOTE) The GeneXpert MRSA Assay (FDA approved for NASAL specimens only), is one component of a comprehensive MRSA colonization surveillance program. It is not intended  to diagnose MRSA infection nor to guide or monitor treatment for MRSA infections. Test performance is not FDA approved in patients less than 54 years old. Performed at [facility], [place] [address], [place], [place] [address]      Scheduled Meds:  amLODipine  10 mg Oral Daily   carvedilol  25 mg Oral BID WC   Chlorhexidine Gluconate Cloth  6 each Topical Q0600   heparin  5,000 Units Subcutaneous Q8H   insulin aspart  0-15 Units Subcutaneous TID WC   pantoprazole (PROTONIX) IV  40 mg Intravenous Q24H   spironolactone  25 mg Oral Daily   Continuous Infusions:  methylPREDNISolone (SOLU-MEDROL) injection Stopped (02/22/21 1938)     LOS: 4 days      Domenic Polite, MD [facility]  02/23/2021, 11:37 AM

## 2021-02-23 NOTE — Progress Notes (Signed)
CSW received consult for court letter for patient. CSW spoke with patient at bedside.  Patient reports he has court date scheduled tomorrow.Patient request letter for court. CSW assisted and provided patient with letter for court. Patient accepted.

## 2021-02-24 DIAGNOSIS — G35 Multiple sclerosis: Secondary | ICD-10-CM

## 2021-02-24 LAB — OSMOLALITY: Osmolality: 303 mOsm/kg — ABNORMAL HIGH (ref 275–295)

## 2021-02-24 LAB — BASIC METABOLIC PANEL
Anion gap: 7 (ref 5–15)
BUN: 51 mg/dL — ABNORMAL HIGH (ref 6–20)
CO2: 22 mmol/L (ref 22–32)
Calcium: 8.9 mg/dL (ref 8.9–10.3)
Chloride: 103 mmol/L (ref 98–111)
Creatinine, Ser: 3.29 mg/dL — ABNORMAL HIGH (ref 0.61–1.24)
GFR, Estimated: 23 mL/min — ABNORMAL LOW (ref >60)
Glucose, Bld: 164 mg/dL — ABNORMAL HIGH (ref 70–99)
Potassium: 3.8 mmol/L (ref 3.5–5.1)
Sodium: 132 mmol/L — ABNORMAL LOW (ref 135–145)

## 2021-02-24 LAB — GLUCOSE, CAPILLARY
Glucose-Capillary: 256 mg/dL — ABNORMAL HIGH (ref 70–99)
Glucose-Capillary: 149 mg/dL — ABNORMAL HIGH (ref 70–99)
Glucose-Capillary: 135 mg/dL — ABNORMAL HIGH (ref 70–99)
Glucose-Capillary: 222 mg/dL — ABNORMAL HIGH (ref 70–99)

## 2021-02-24 LAB — CBC
HCT: 32.7 % — ABNORMAL LOW (ref 39.0–52.0)
Hemoglobin: 11 g/dL — ABNORMAL LOW (ref 13.0–17.0)
MCH: 24 pg — ABNORMAL LOW (ref 26.0–34.0)
MCHC: 33.6 g/dL (ref 30.0–36.0)
MCV: 71.2 fL — ABNORMAL LOW (ref 80.0–100.0)
Platelets: 213 10*3/uL (ref 150–400)
RBC: 4.59 MIL/uL (ref 4.22–5.81)
RDW: 15.6 % — ABNORMAL HIGH (ref 11.5–15.5)
WBC: 15.9 10*3/uL — ABNORMAL HIGH (ref 4.0–10.5)
nRBC: 0 % (ref 0.0–0.2)

## 2021-02-24 LAB — OSMOLALITY, URINE: Osmolality, Ur: 627 mOsm/kg (ref 300–900)

## 2021-02-24 MED ORDER — VITAMIN D (ERGOCALCIFEROL) 1.25 MG (50000 UNIT) PO CAPS
50000.0000 [IU] | ORAL_CAPSULE | ORAL | Status: DC
  Administered 2021-02-24: 50000 [IU] via ORAL
  Filled 2021-02-24: qty 1

## 2021-02-24 MED ORDER — ACETAMINOPHEN 325 MG PO TABS
650.0000 mg | ORAL_TABLET | Freq: Four times a day (QID) | ORAL | Status: DC | PRN
  Administered 2021-02-24: 650 mg via ORAL
  Filled 2021-02-24: qty 2

## 2021-02-24 NOTE — Progress Notes (Signed)
Nephrology Consult   Assessment/Recommendations: George Day is a/an 45 y.o. male with a past medical history of hypertension, CKD 3 who presented to the hospital for severe symptomatic hypotension, which required nicardipine drip, and transition to p.o. medication.  Also found to have multiple sclerosis on imaging, which Solu-Medrol was started by neurology.  Nephrology was consulted for AKI.  Serum creatinine trending down.   Non-Oliguric AKI CKD 3B Serum creatinine trending down.  Patient still has good urine output.  Multiple etiologies for his AKI: Hypertensive nephropathy plus HCTZ.  Can also be normotensive AKI given his longstanding of uncontrolled hypertension.  Renal ultrasound did not show any obstruction.  UA is not consistent with ATN. -Continue holding spironolactone.  If becomes hypertensive, can restart spironolactone. -Avoid further nephrotoxins including NSAIDS, Morphine.  Unless absolutely necessary, avoid CT with contrast and/or MRI with gadolinium.    -Continue to monitor daily Cr, Dose meds for GFR<15 -Monitor Daily I/Os, Daily weight  -No indication for emergent HD   Severe symptomatic hypertension TSH within normal limits.  Pending pheochromocytoma labs.  Aldo/renin ratio within normal limits. 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  Pending pheochromocytoma labs.  Patient has a referral to surgery.   Euvolemic hyponatremia -Check serum os and urine os  Multiple sclerosis Solu-Medrol per neurology   [facility] 02/24/2021 8:23 AM   _____________________________________________________________________________________   History of Present Illness: Patient sitting at bedside during examination.  He reports feeling well without any acute complaints.  Endorses persistent right flank pain with lying down.  Say that he has to use a cane to walk due to his right-sided weakness.   Reports good urine output.   Medications:  Current Facility-Administered Medications  Medication Dose Route Frequency Provider Last Rate Last Admin   amLODipine (NORVASC) tablet 10 mg  10 mg Oral Daily Ollis, Brandi L, NP   10 mg at 02/23/21 1026   carvedilol (COREG) tablet 25 mg  25 mg Oral BID WC Jacky Kindle, MD   25 mg at 02/23/21 1631   Chlorhexidine Gluconate Cloth 2 % PADS 6 each  6 each Topical Q0600 Jacky Kindle, MD   6 each at 02/23/21 1453   docusate sodium (COLACE) capsule 100 mg  100 mg Oral BID PRN Noe Gens L, NP       heparin injection 5,000 Units  5,000 Units Subcutaneous Q8H Ollis, Brandi L, NP   5,000 Units at 02/24/21 9357   hydrALAZINE (APRESOLINE) tablet 10 mg  10 mg Oral Q8H Gaylan Gerold, DO   10 mg at 02/24/21 0177   insulin aspart (novoLOG) injection 0-15 Units  0-15 Units Subcutaneous TID WC Debbe Odea, MD   2 Units at 02/23/21 1632   labetalol (NORMODYNE) injection 10 mg  10 mg Intravenous Q2H PRN Opyd, Ilene Qua, MD   10 mg at 02/19/21 0948   methylPREDNISolone sodium succinate (SOLU-MEDROL) 1,000 mg in sodium chloride 0.9 % 50 mL IVPB  1,000 mg Intravenous Q24H Greta Doom, MD 58 mL/hr at 02/23/21 2324 1,000 mg at 02/23/21 2324   ondansetron (ZOFRAN) injection 4 mg  4 mg Intravenous Q6H PRN Noe Gens L, NP   4 mg at 02/20/21 0139   pantoprazole (PROTONIX) EC tablet 40 mg  40 mg Oral Q1200 Domenic Polite, MD   40 mg at 02/23/21 1436   polyethylene glycol (MIRALAX / GLYCOLAX) packet 17 g  17 g Oral Daily PRN Donita Brooks, NP  ALLERGIES Penicillins and Tomato  MEDICAL HISTORY Past Medical History:  Diagnosis Date   Anemia 1998   Anginal pain ([place])    Anxiety    Asthma    Coronary artery disease    Diabetes mellitus without complication ([place]) 0981   Hypertension 2019   MI (myocardial infarction) ([place]) 2010   Stroke [facility]) 2010     SOCIAL HISTORY Social History   Socioeconomic History   Marital status: Single     Spouse name: Not on file   Number of children: 3   Years of education: Not on file   Highest education level: Not on file  Occupational History   Not on file  Tobacco Use   Smoking status: Some Days    Packs/day: 0.50    Years: 31.00    Pack years: 15.50    Types: Cigars, E-cigarettes, Cigarettes   Smokeless tobacco: Never  Vaping Use   Vaping Use: Never used  Substance and Sexual Activity   Alcohol use: Yes    Comment: occasioanlly    Drug use: Never   Sexual activity: Yes    Birth control/protection: None  Other Topics Concern   Not on file  Social History Narrative   Not on file   Social Determinants of Health   Financial Resource Strain: Not on file  Food Insecurity: Not on file  Transportation Needs: Not on file  Physical Activity: Not on file  Stress: Not on file  Social Connections: Not on file  Intimate Partner Violence: Not on file     FAMILY HISTORY Family History  Problem Relation Age of Onset   Diabetes Mother    Hyperlipidemia Mother    Diabetes Sister    Hyperlipidemia Sister    Hearing loss Sister    Diabetes Brother    Hyperlipidemia Brother      No family history of kidney disease  Review of Systems: 12 systems reviewed Otherwise as per HPI, all other systems reviewed and negative  Physical Exam: Vitals:   02/24/21 0602 02/24/21 0734  BP: 134/82 (!) 150/91  Pulse: 69 83  Resp: 18 18  Temp: 98 F (36.7 C) 97.7 F (36.5 C)  SpO2: 98% 98%   No intake/output data recorded.  Intake/Output Summary (Last 24 hours) at 02/24/2021 1914 Last data filed at 02/24/2021 0055 Gross per 24 hour  Intake 440 ml  Output 1650 ml  Net -1210 ml   General: well-appearing, no acute distress HEENT: anicteric sclera, oropharynx clear without lesions CV: regular rate, normal rhythm, no murmurs, no gallops, no rubs, trace LE edema Lungs: clear to auscultation bilaterally, normal work of breathing Skin: no visible lesions or rashes Psych: alert, engaged,  appropriate mood and affect Musculoskeletal: no obvious deformities Neuro: normal speech, no gross focal deficits   Test Results Reviewed Lab Results  Component Value Date   NA 132 (L) 02/24/2021   K 3.8 02/24/2021   CL 103 02/24/2021   CO2 22 02/24/2021   BUN 51 (H) 02/24/2021   CREATININE 3.29 (H) 02/24/2021   GFR 25.35 (L) 02/15/2021   CALCIUM 8.9 02/24/2021   ALBUMIN 3.4 (L) 02/19/2021   PHOS 2.9 02/20/2021     I have reviewed all relevant outside healthcare records related to the patient's kidney injury.

## 2021-02-24 NOTE — Progress Notes (Signed)
Subjective: Continues to feel some improvement  Exam: Vitals:   02/24/21 0734 02/24/21 1127  BP: (!) 150/91 (!) 162/93  Pulse: 83 80  Resp: 18 18  Temp: 97.7 F (36.5 C) 98.1 F (36.7 C)  SpO2: 98% 98%   Gen: In bed, NAD Resp: non-labored breathing, no acute distress Abd: soft, nt  Neuro: MS: awake, alert interactive and appropriate CN:VFF, EOMI Motor: 5/5 in BUE, he has 4/5 weakness in the RLE Sensory:Decreased to LT and temp in the right leg.  DTR:3+ in the right patella, 2+ in the left.    Impression: 45 yo M with bilateral paresthesias, blurred vision, hypertensive emergency.  His vision I suspect was secondary to the hypertension.  His bilateral paresthesias are most likely result of the cervical spine lesion.  Given the new onset and the presence of the T2 lesion on MRI I think that this merits steroids.   He has had previous clinical episodes which I think are referable to the thoracic lesions, making this by far most consistent with multiple sclerosis.  Most of his brain lesions are more consistent with small vessel disease, but there is a periventricular lesion that I think could be MS related.  At this time, I think that multiple sclerosis is by far the most likely diagnosis.  With the presence of multiple lesions, I think that spinal cord ischemia is essentially ruled out.  Recommendations: 1) Solumedrol, day 4/5 2) neurology will continue to follow   Roland Rack, MD [facility] [phone]  If 7pm- 7am, please page neurology on call as listed in [place].

## 2021-02-24 NOTE — Progress Notes (Signed)
PROGRESS NOTE    George Day   EQA:[id number]  DOB: 03-07-1976  DOA: 02/19/2021 PCP: Ladell Pier, MD   Brief Narrative:  George Day is a 45 year old male with hypertension, CKD stage III, coronary artery disease, recent diagnosis of right adrenal mass (1 visit with endocrinology so far) who presented to the ED on 02/19/2021 with blurred vision, right arm and leg left-sided arm numbness and a headache and was found to have a blood pressure of 213/126.  He admitted to not being compliant with his medications and had not been taking his carvedilol and hydrochlorothiazide.  He was placed on a Cardene infusion in the ED in addition to oral antihypertensives.  Neurology was also consulted.  The patient underwent MRI of head and neck.  A C-spine showed true cervical spinal cord lesions suggestive of demyelinating disease.  He was admitted to the ICU and placed on nicardipine infusion. On 7/5, the patient continued to have paresthesias although his vision and headache had improved.  T-spine MRI revealed a lesion at T2.  Neurology felt the patient may have multiple sclerosis recommended to start Solu-Medrol 1 g daily. - Nicardipine had been weaned off and oral medications adjusted further. -24-hour urine collection started by the critical care team to rule out pheochromocytoma  Subjective: -Feels better overall, blurred vision has resolved, mild paresthesias improving  Assessment & Plan:   Hypertensive crisis with acute renal failure, blurred vision and headache Chronic diastolic heart failure grade 2 7cm Right adrenal mass -Required nicardipine drip on admission, now off  -continue carvedilol and amlodipine, started on hydralazine yesterday -2D echo in 12/21 revealed grade 2 diastolic dysfunction, EF 50 to 55% -Random cortisol 10.7 on 7/5, renin aldosterone ratio was normal in June -Plasma metanephrines, catecholamines from 6/29, cannot access, repeat pending -UDS was positive for  cannabis only on admission -He is followed by endocrinology Dr. Kelton Pillar was recently referred to surgeon for adrenal mass excision due to its size   AKI-stage 3b chronic kidney disease with acute metabolic acidosis -Creatinine baseline appears to be 2.2-2.6 - Creatinine 2.93 on admission, trended up to 3.4 now, likely hemodynamically mediated with blood pressure changes, hypertensive urgency -Urine output is good, urinalysis with 100+ proteinuria -Appreciate nephrology input, creatinine appears to be plateauing  Suspected Multiple sclerosis -History of waxing and waning neurological symptoms for years, although his visual changes could be secondary to hypertension his paresthesias was suspected to be related to his cervical spine lesion -Abnormal MRI C-spine without contrast -Appreciate neurology input -Continue IV Solu-Medrol day 4/5 today -Continue PPI -Vitamin D level 17, will start replacement  Borderline diabetes  - A1c 5.9-continue diet control-outpatient follow-up with endocrinology  Microcytic anemia - Anemia panel was abnormal  Urine drug screen positive for tetrahydrocannabinol  Time spent in minutes: 35 DVT prophylaxis: heparin  SQ Code Status: Full code Family Communication: Discussed patient in detail, no family at bedside Disposition Plan:  Status is: Inpatient  Remains inpatient appropriate because:IV treatments appropriate due to intensity of illness or inability to take PO  Dispo: The patient is from: Home              Anticipated d/c is to: Home likely tomorrow              Patient currently is not medically stable to d/c.   Difficult to place patient No      Consultants:  Pulmonary critical care Neurology Procedures:  None Antimicrobials:  Anti-infectives (From admission, onward)    None  Objective: Vitals:   02/23/21 1957 02/24/21 0051 02/24/21 0602 02/24/21 0734  BP: (!) 141/88 (!) 151/95 134/82 (!) 150/91  Pulse: 78 74 69 83   Resp: 17 18 18 18   Temp: 98.7 F (37.1 C) 97.9 F (36.6 C) 98 F (36.7 C) 97.7 F (36.5 C)  TempSrc: Oral Oral Oral Oral  SpO2: 97% 98% 98% 98%  Weight:   97.5 kg   Height:        Intake/Output Summary (Last 24 hours) at 02/24/2021 1116 Last data filed at 02/24/2021 0055 Gross per 24 hour  Intake 240 ml  Output 1650 ml  Net -1410 ml   Filed Weights   02/22/21 0500 02/23/21 0500 02/24/21 0602  Weight: 99.4 kg 96.7 kg 97.5 kg    Examination: General exam: Pleasant average built male sitting up in bed, AAOx3, no distress HEENT: No JVD CVS: S1-S2, regular rate rhythm Lungs: Clear bilaterally Abdomen: Soft, nontender, bowel sounds present Extremities: No edema  Neuro: Moves all extremities, has mild right lower extremity weakness 4/5 rest is normal, sensory decreased light touch in the right leg  Skin: No rashes or ulcers Psychiatry:  Mood & affect appropriate.     Data Reviewed: I have personally reviewed following labs and imaging studies  CBC: Recent Labs  Lab 02/19/21 0335 02/19/21 0350 02/20/21 0007 02/21/21 0021 02/22/21 0147 02/24/21 0244  WBC 6.6  --  6.4 5.5 5.3 15.9*  NEUTROABS 4.2  --   --   --   --   --   HGB 12.3* 13.6 12.6* 10.8* 11.1* 11.0*  HCT 37.5* 40.0 37.4* 33.3* 34.1* 32.7*  MCV 72.4*  --  70.7* 71.8* 72.2* 71.2*  PLT 219  --  246 229 225 720   Basic Metabolic Panel: Recent Labs  Lab 02/20/21 0007 02/21/21 0021 02/21/21 1307 02/22/21 0147 02/23/21 0322 02/24/21 0244  NA 133* 132* 131* 131* 134* 132*  K 3.2* 3.2* 3.9 4.0 4.1 3.8  CL 106 101 101 103 103 103  CO2 21* 24 22 20* 20* 22  GLUCOSE 122* 113* 95 147* 147* 164*  BUN 28* 32* 31* 36* 45* 51*  CREATININE 3.02* 3.33* 3.39* 3.26* 3.43* 3.29*  CALCIUM 8.6* 8.8* 8.9 9.3 9.3 8.9  MG 2.1  --  2.3 2.2  --   --   PHOS 2.9  --   --   --   --   --    GFR: Estimated Creatinine Clearance: 32.6 mL/min (A) (by C-G formula based on SCr of 3.29 mg/dL (H)). Liver Function Tests: Recent Labs   Lab 02/19/21 0335  AST 32  ALT 27  ALKPHOS 77  BILITOT 1.1  PROT 7.5  ALBUMIN 3.4*   No results for input(s): LIPASE, AMYLASE in the last 168 hours. No results for input(s): AMMONIA in the last 168 hours. Coagulation Profile: Recent Labs  Lab 02/19/21 0335  INR 1.0   Cardiac Enzymes: No results for input(s): CKTOTAL, CKMB, CKMBINDEX, TROPONINI in the last 168 hours. BNP (last 3 results) No results for input(s): PROBNP in the last 8760 hours. HbA1C: No results for input(s): HGBA1C in the last 72 hours.  CBG: Recent Labs  Lab 02/23/21 0652 02/23/21 1115 02/23/21 1621 02/23/21 2101 02/24/21 0731  GLUCAP 139* 179* 136* 157* 256*   Lipid Profile: No results for input(s): CHOL, HDL, LDLCALC, TRIG, CHOLHDL, LDLDIRECT in the last 72 hours. Thyroid Function Tests: Recent Labs    02/23/21 0322  TSH 0.482   Anemia Panel: Recent Labs  02/22/21 1446 02/23/21 0322  VITAMINB12  --  660  FOLATE 17.2  --   FERRITIN 103  --   TIBC 330  --   IRON 71  --   RETICCTPCT 1.3  --    Urine analysis:    Component Value Date/Time   COLORURINE YELLOW 02/23/2021 1633   APPEARANCEUR CLEAR 02/23/2021 1633   LABSPEC 1.015 02/23/2021 1633   PHURINE 5.0 02/23/2021 1633   GLUCOSEU NEGATIVE 02/23/2021 1633   HGBUR NEGATIVE 02/23/2021 1633   BILIRUBINUR NEGATIVE 02/23/2021 1633   KETONESUR NEGATIVE 02/23/2021 1633   PROTEINUR 30 (A) 02/23/2021 1633   NITRITE NEGATIVE 02/23/2021 1633   LEUKOCYTESUR NEGATIVE 02/23/2021 1633   Sepsis Labs: @LABRCNTIP (procalcitonin:4,lacticidven:4) ) Recent Results (from the past 240 hour(s))  Resp Panel by RT-PCR (Flu A&B, Covid) Nasopharyngeal Swab     Status: None   Collection Time: 02/19/21  4:36 AM   Specimen: Nasopharyngeal Swab; Nasopharyngeal(NP) swabs in vial transport medium  Result Value Ref Range Status   SARS Coronavirus 2 by RT PCR NEGATIVE NEGATIVE Final    Comment: (NOTE) SARS-CoV-2 target nucleic acids are NOT  DETECTED.  The SARS-CoV-2 RNA is generally detectable in upper respiratory specimens during the acute phase of infection. The lowest concentration of SARS-CoV-2 viral copies this assay can detect is 138 copies/mL. A negative result does not preclude SARS-Cov-2 infection and should not be used as the sole basis for treatment or other patient management decisions. A negative result may occur with  improper specimen collection/handling, submission of specimen other than nasopharyngeal swab, presence of viral mutation(s) within the areas targeted by this assay, and inadequate number of viral copies(<138 copies/mL). A negative result must be combined with clinical observations, patient history, and epidemiological information. The expected result is Negative.  Fact Sheet for Patients:  EntrepreneurPulse.com.au  Fact Sheet for Healthcare Providers:  IncredibleEmployment.be  This test is no t yet approved or cleared by the [place] FDA and  has been authorized for detection and/or diagnosis of SARS-CoV-2 by FDA under an Emergency Use Authorization (EUA). This EUA will remain  in effect (meaning this test can be used) for the duration of the COVID-19 declaration under Section 564(b)(1) of the Act, 21 U.S.C.section 360bbb-3(b)(1), unless the authorization is terminated  or revoked sooner.       Influenza A by PCR NEGATIVE NEGATIVE Final   Influenza B by PCR NEGATIVE NEGATIVE Final    Comment: (NOTE) The Xpert Xpress SARS-CoV-2/FLU/RSV plus assay is intended as an aid in the diagnosis of influenza from Nasopharyngeal swab specimens and should not be used as a sole basis for treatment. Nasal washings and aspirates are unacceptable for Xpert Xpress SARS-CoV-2/FLU/RSV testing.  Fact Sheet for Patients: EntrepreneurPulse.com.au  Fact Sheet for Healthcare Providers: IncredibleEmployment.be  This test is not yet  approved or cleared by the [place] FDA and has been authorized for detection and/or diagnosis of SARS-CoV-2 by FDA under an Emergency Use Authorization (EUA). This EUA will remain in effect (meaning this test can be used) for the duration of the COVID-19 declaration under Section 564(b)(1) of the Act, 21 U.S.C. section 360bbb-3(b)(1), unless the authorization is terminated or revoked.  Performed at [facility], [place] [address] [place], [place] [address]   MRSA Next Gen by PCR, Nasal     Status: None   Collection Time: 02/19/21  8:00 PM   Specimen: Nasal Mucosa; Nasal Swab  Result Value Ref Range Status   MRSA by PCR Next Gen NOT DETECTED  NOT DETECTED Final    Comment: (NOTE) The GeneXpert MRSA Assay (FDA approved for NASAL specimens only), is one component of a comprehensive MRSA colonization surveillance program. It is not intended to diagnose MRSA infection nor to guide or monitor treatment for MRSA infections. Test performance is not FDA approved in patients less than 54 years old. Performed at [facility], [place] [address], [place], [place] [address]      Scheduled Meds:  amLODipine  10 mg Oral Daily   carvedilol  25 mg Oral BID WC   Chlorhexidine Gluconate Cloth  6 each Topical Q0600   heparin  5,000 Units Subcutaneous Q8H   hydrALAZINE  10 mg Oral Q8H   insulin aspart  0-15 Units Subcutaneous TID WC   pantoprazole  40 mg Oral Q1200   Continuous Infusions:  methylPREDNISolone (SOLU-MEDROL) injection 1,000 mg (02/23/21 2324)     LOS: 5 days    Domenic Polite, MD [facility]  02/24/2021, 11:16 AM

## 2021-02-24 NOTE — Progress Notes (Signed)
Physical Therapy Treatment Patient Details Name: George Day MRN: [id number] DOB: 02-13-1976 Today's Date: 02/24/2021    History of Present Illness pt is a 45 y/o male presenting to the ED on 7/3 with blurred vision, R arm and leg numbness with chronic weakness and a headache.  Found to hanve a BP of 213/126.  MRI revealing cervical and T2 lesions suggestive of MS.  PMHx:  Anginal pain, CAD, DM, HTN, MI, Stroke    PT Comments    Pt eager to practice gait and stairs with use of cane. Pt requiring min sequencing cues for cane use, but overall performs gait well with cane. Pt with no evidence of unsteadiness or loss of balance. Pt proficiently climbed x10 steps with close guard for safety. Pt progressing well, vss during mobility .    Follow Up Recommendations  No PT follow up     Equipment Recommendations  Other (comment) (pt to purchase a cane himself)    Recommendations for Other Services       Precautions / Restrictions Precautions Precautions: Fall Restrictions Weight Bearing Restrictions: No    Mobility  Bed Mobility Overal bed mobility: Needs Assistance             General bed mobility comments: pt sitting EOB upon PT arrival to room    Transfers Overall transfer level: Needs assistance Equipment used: Straight cane Transfers: Sit to/from Stand Sit to Stand: Modified independent (Device/Increase time)         General transfer comment: Mod I for increased time to rise, cane in L hand  Ambulation/Gait Ambulation/Gait assistance: Modified independent (Device/Increase time) Gait Distance (Feet): 200 Feet Assistive device: Straight cane Gait Pattern/deviations: Step-through pattern;Decreased stride length Gait velocity: decr   General Gait Details: mod I for use of cane, RLE circumduction noted. Cues for sequencing gait with cane   Stairs Stairs: Yes Stairs assistance: Min guard Stair Management: One rail Right;Forwards;With cane;Step to  pattern Number of Stairs: 10 General stair comments: min guard for safety, verbal cuing for sequencing steps with cane   Wheelchair Mobility    Modified Rankin (Stroke Patients Only)       Balance Overall balance assessment: Needs assistance Sitting-balance support: No upper extremity supported;Feet supported Sitting balance-Leahy Scale: Good     Standing balance support: No upper extremity supported;During functional activity Standing balance-Leahy Scale: Fair                              Cognition Arousal/Alertness: Awake/alert Behavior During Therapy: WFL for tasks assessed/performed Overall Cognitive Status: Within Functional Limits for tasks assessed                                        Exercises      General Comments        Pertinent Vitals/Pain Pain Assessment: Faces Faces Pain Scale: Hurts a little bit Pain Location: back Pain Descriptors / Indicators: Discomfort;Aching Pain Intervention(s): Limited activity within patient's tolerance;Monitored during session;Repositioned;Premedicated before session    Home Living                      Prior Function            PT Goals (current goals can now be found in the care plan section) Acute Rehab PT Goals Patient Stated Goal: get a cane to make  my walking safer PT Goal Formulation: With patient Time For Goal Achievement: 02/24/21 Potential to Achieve Goals: Good Progress towards PT goals: Progressing toward goals    Frequency    Min 3X/week      PT Plan Current plan remains appropriate    Co-evaluation              AM-PAC PT "6 Clicks" Mobility   Outcome Measure  Help needed turning from your back to your side while in a flat bed without using bedrails?: None Help needed moving from lying on your back to sitting on the side of a flat bed without using bedrails?: None Help needed moving to and from a bed to a chair (including a wheelchair)?:  None Help needed standing up from a chair using your arms (e.g., wheelchair or bedside chair)?: None Help needed to walk in hospital room?: None Help needed climbing 3-5 steps with a railing? : A Little 6 Click Score: 23    End of Session   Activity Tolerance: Patient tolerated treatment well Patient left: with call bell/phone within reach;in bed (bed alarm not set, RN states pt has been compliant with pressing call button and waiting for assist prior to mobilizing) Nurse Communication: Mobility status PT Visit Diagnosis: Unsteadiness on feet (R26.81)     Time: 2023-3435 PT Time Calculation (min) (ACUTE ONLY): 22 min  Charges:  $Gait Training: 8-22 mins                     Stacie Glaze, PT DPT Acute Rehabilitation Services Pager [phone]  Office [phone]    Roxine Caddy E Ruffin Pyo 02/24/2021, 4:53 PM

## 2021-02-25 DIAGNOSIS — R569 Unspecified convulsions: Secondary | ICD-10-CM

## 2021-02-25 LAB — BASIC METABOLIC PANEL
Anion gap: 6 (ref 5–15)
BUN: 55 mg/dL — ABNORMAL HIGH (ref 6–20)
CO2: 22 mmol/L (ref 22–32)
Calcium: 8.7 mg/dL — ABNORMAL LOW (ref 8.9–10.3)
Chloride: 104 mmol/L (ref 98–111)
Creatinine, Ser: 3.58 mg/dL — ABNORMAL HIGH (ref 0.61–1.24)
GFR, Estimated: 20 mL/min — ABNORMAL LOW (ref >60)
Glucose, Bld: 177 mg/dL — ABNORMAL HIGH (ref 70–99)
Potassium: 3.9 mmol/L (ref 3.5–5.1)
Sodium: 132 mmol/L — ABNORMAL LOW (ref 135–145)

## 2021-02-25 LAB — GLUCOSE, CAPILLARY: Glucose-Capillary: 164 mg/dL — ABNORMAL HIGH (ref 70–99)

## 2021-02-25 MED ORDER — CARVEDILOL 25 MG PO TABS: 25.0000 mg | ORAL_TABLET | Freq: Two times a day (BID) | ORAL | 0 refills | Status: DC

## 2021-02-25 MED ORDER — VITAMIN D (ERGOCALCIFEROL) 1.25 MG (50000 UNIT) PO CAPS: 50000.0000 [IU] | ORAL_CAPSULE | ORAL | 0 refills | Status: DC

## 2021-02-25 MED ORDER — HYDRALAZINE HCL 10 MG PO TABS: 10.0000 mg | ORAL_TABLET | Freq: Three times a day (TID) | ORAL | 0 refills | Status: DC

## 2021-02-25 NOTE — Plan of Care (Signed)

## 2021-02-25 NOTE — Discharge Summary (Signed)
Physician Discharge Summary  George Day WJX:[id number] DOB: 03/23/1976 DOA: 02/19/2021  PCP: Ladell Pier, MD  Admit date: 02/19/2021 Discharge date: 02/25/2021  Time spent: 35 minutes  Recommendations for Outpatient Follow-up:  Nephrology Dr. Candiss Norse in 1 to 2 weeks with labs [facility] in 2 to 3 weeks for multiple sclerosis   Discharge Diagnoses:  Principal Problem:   Hypertensive crisis Borderline diabetes Multiple sclerosis   Stage 3b chronic kidney disease ([place])   Right adrenal mass ([place])   Hypokalemia   Discharge Condition: Stable  Diet recommendation: Low-sodium, carb modified  Filed Weights   02/23/21 0500 02/24/21 0602 02/25/21 0610  Weight: 96.7 kg 97.5 kg 98.3 kg    History of present illness:  George Day is a 45 year old male with hypertension, CKD stage III, coronary artery disease, recent diagnosis of right adrenal mass (1 visit with endocrinology so far) who presented to the ED on 02/19/2021 with blurred vision, right arm and leg left-sided arm numbness and a headache and was found to have a blood pressure of 213/126.  He admitted to not being compliant with his medications and had not been taking his carvedilol and hydrochlorothiazide.  He was placed on a Cardene infusion in the ED in addition to oral antihypertensives.  Neurology was also consulted.  The patient underwent MRI of head and neck.  A C-spine showed true cervical spinal cord lesions suggestive of demyelinating disease.  He was admitted to the ICU and placed on nicardipine infusion.  Hospital Course:   Hypertensive crisis with acute renal failure, blurred vision and headache Chronic diastolic heart failure grade 2 7cm Right adrenal mass -Required nicardipine drip on admission, now off -Now controlled on carvedilol amlodipine and hydralazine -2D echo in 12/21 revealed grade 2 diastolic dysfunction, EF 50 to 55% -Random cortisol 10.7 on 7/5, renin aldosterone ratio was normal in  June -Plasma metanephrines, catecholamines from 6/29, cannot access, repeat pending -UDS was positive for cannabis only on admission -He is followed by endocrinology Dr. Kelton Pillar was recently referred to surgeon for adrenal mass excision due to its size   AKI-stage 3b chronic kidney disease with acute metabolic acidosis -Creatinine baseline appears to be 2.2-2.6 - Creatinine 2.93 on admission, trended up to 3.4 now, likely hemodynamically mediated with blood pressure changes, hypertensive urgency -Urine output is good, urinalysis with 100+ proteinuria -Seen by nephrology in consultation, overall felt to be stable despite slight worsening of creatinine without any uremic symptoms or volume overload, felt to be worsened in the setting of accelerated hypertension -BP improved now, nephrology will arrange follow-up in 1 to 2 weeks  Suspected Multiple sclerosis -History of waxing and waning neurological symptoms for years, although his visual changes could be secondary to hypertension his paresthesias was suspected to be related to his cervical spine lesion -Abnormal MRI C-spine without contrast suggested demyelinating lesions, neurology was convinced this was multiple sclerosis and he was started on a 5-day course of high-dose IV Solu-Medrol, patient had good clinical response to this with improvement in his paresthesias -Vitamin D level 17, will start replacement -Referral sent to [facility] neurology for follow-up   Borderline diabetes - A1c 5.9-continue diet control   Microcytic anemia - Anemia panel was abnormal   Urine drug screen positive for tetrahydrocannabinol  Discharge Exam: Vitals:   02/25/21 0610 02/25/21 0818  BP: (!) 147/95 (!) 153/92  Pulse: 75 74  Resp: 17 17  Temp: 97.9 F (36.6 C) 97.9 F (36.6 C)  SpO2: 98% 100%    General:  AAOx3 Cardiovascular: S1S2/RRR Respiratory: CTAB  Discharge Instructions   Discharge Instructions     Ambulatory referral to  Neurology   Complete by: As directed    An appointment is requested in approximately: 2-3 weeks   Diet - low sodium heart healthy   Complete by: As directed    Increase activity slowly   Complete by: As directed       Allergies as of 02/25/2021       Reactions   Penicillins Anaphylaxis   ALL   Tomato Swelling        Medication List     TAKE these medications    aspirin EC 81 MG tablet Take 1 tablet (81 mg total) by mouth daily. Swallow whole.   atorvastatin 40 MG tablet Commonly known as: LIPITOR Take 1 tablet (40 mg total) by mouth daily.   carvedilol 25 MG tablet Commonly known as: COREG Take 1 tablet (25 mg total) by mouth 2 (two) times daily with a meal.   fluticasone-salmeterol 250-50 MCG/ACT Aepb Commonly known as: ADVAIR Inhale 1 puff into the lungs 2 (two) times daily.   hydrALAZINE 10 MG tablet Commonly known as: APRESOLINE Take 1 tablet (10 mg total) by mouth every 8 (eight) hours.   nitroGLYCERIN 0.6 MG SL tablet Commonly known as: NITROSTAT Place 0.6 mg under the tongue every 5 (five) minutes as needed for chest pain.   Vitamin D (Ergocalciferol) 1.25 MG (50000 UNIT) Caps capsule Commonly known as: DRISDOL Take 1 capsule (50,000 Units total) by mouth every 7 (seven) days. Start taking on: March 03, 2021       ASK your doctor about these medications    amLODipine 10 MG tablet Commonly known as: NORVASC TAKE 1 TABLET BY MOUTH ONCE A DAY       Allergies  Allergen Reactions   Penicillins Anaphylaxis    ALL   Tomato Swelling    Follow-up Information     Ladell Pier, MD Follow up in 10 day(s).   Specialty: Internal Medicine Contact information: [address] [place] [address] [phone]                  The results of significant diagnostics from this hospitalization (including imaging, microbiology, ancillary and laboratory) are listed below for reference.    Significant Diagnostic Studies: CT HEAD WO  CONTRAST  Result Date: 02/19/2021 CLINICAL DATA:  45 year old male with dizziness, renal vascular hypertension. EXAM: CT HEAD WITHOUT CONTRAST TECHNIQUE: Contiguous axial images were obtained from the base of the skull through the vertex without intravenous contrast. COMPARISON:  None. FINDINGS: Brain: Mild streak artifact in the anterior cranial fossa. No midline shift, ventriculomegaly, mass effect, evidence of mass lesion, intracranial hemorrhage or evidence of cortically based acute infarction. Occasional foci of white matter hypodensity (anterior right frontal lobe series 2, image 19). Otherwise preserved gray-white matter differentiation. Vascular: No suspicious intracranial vascular hyperdensity. Skull: Chronic right lamina papyracea fracture. No acute osseous abnormality identified. Sinuses/Orbits: Visualized paranasal sinuses and mastoids are clear. Tympanic cavities are clear. Other: No acute orbit or scalp soft tissue finding. IMPRESSION: 1. No acute intracranial abnormality. 2. Scattered mild nonspecific white matter changes, most commonly due to chronic small vessel disease. 3. Chronic right lamina papyracea fracture. Electronically Signed   By: Genevie Ann M.D.   On: 02/19/2021 04:23   MR BRAIN WO CONTRAST  Result Date: 02/19/2021 CLINICAL DATA:  45 year old male with sudden onset bilateral blurred vision, extremity tingling, left side numbness. Hypertensive, systolic  blood pressure 250's. Right adrenal mass. EXAM: MRI HEAD WITHOUT CONTRAST TECHNIQUE: Multiplanar, multiecho pulse sequences of the brain and surrounding structures were obtained without intravenous contrast. COMPARISON:  Head CT without contrast 0403 hours today. FINDINGS: Brain: Normal cerebral volume. No restricted diffusion to suggest acute infarction. No midline shift, mass effect, evidence of mass lesion, ventriculomegaly, extra-axial collection or acute intracranial hemorrhage. Cervicomedullary junction and pituitary are within  normal limits. Moderately advanced and patchy bilateral cerebral white matter T2 and FLAIR hyperintensity, including some nodular periventricular and subcortical areas of involvement such as on series 11 image 17. Diffusion is facilitated. No direct corpus callosum involvement. No chronic cerebral blood products. No cortical encephalomalacia. The deep gray matter nuclei, brainstem and cerebellum appear to remain normal. Vascular: Major intracranial vascular flow voids are preserved. The right vertebral artery appears mildly dominant. Skull and upper cervical spine: Cervical spine is reported separately today. Visualized bone marrow signal is within normal limits. Sinuses/Orbits: Normal suprasellar cistern and optic chiasm. Orbits soft tissues appears symmetric and within normal limits. Chronic right lamina papyracea fracture. Paranasal sinuses and mastoids are stable and well aerated. Other: Grossly normal visible internal auditory structures. Negative visible scalp and face. IMPRESSION: 1. No acute intracranial abnormality. 2. Moderate but nonspecific abnormal cerebral white matter signal. Top differential considerations include accelerated small vessel ischemia, demyelination, sequelae hypercoagulable state, vasculitis, or prior infection. 3. See also Cervical Spine MRI today reported separately. Electronically Signed   By: Genevie Ann M.D.   On: 02/19/2021 08:32   MR CERVICAL SPINE WO CONTRAST  Result Date: 02/19/2021 CLINICAL DATA:  45 year old male with sudden onset bilateral blurred vision, extremity tingling, left side numbness. Hypertensive, systolic blood pressure 937'T. Right adrenal mass. EXAM: MRI CERVICAL SPINE WITHOUT CONTRAST TECHNIQUE: Multiplanar, multisequence MR imaging of the cervical spine was performed. No intravenous contrast was administered. COMPARISON:  Brain MRI today reported separately. FINDINGS: Alignment: Preserved cervical lordosis. Vertebrae: No marrow edema or evidence of acute  osseous abnormality. Visualized bone marrow signal is within normal limits. Cord: Abnormal. There is a small discrete right dorsal and lateral spinal cord T2 and STIR hyperintense lesion at the C2 cord level (series 7, image 6 and series 8, image 7). And there is a larger, confluent right lateral cord lesion at the C5 level (series 8, image 22) which is about 1 vertebral body in length (series 7, image 7). No cord expansion is associated with these lesions. And elsewhere the visible cervical and upper thoracic cord signal and morphology remain normal. Posterior Fossa, vertebral arteries, paraspinal tissues: Cervicomedullary junction is within normal limits. Brain is reported separately today. Preserved major vascular flow voids in the neck. Mildly dominant right vertebral artery. Negative visible neck soft tissues and lung apices. Disc levels: Mild congenital spinal canal narrowing related to short pedicle distance. Superimposed degenerative changes: C2-C3:  Negative. C3-C4:  Minimal disc bulging. C4-C5: Mild disc bulging and posterior element hypertrophy. Mild overall spinal stenosis. Mild bilateral C5 neural foraminal stenosis. C5-C6:  Negative. C6-C7: Disc space loss. Mild circumferential disc bulge eccentric to the left. Mild spinal stenosis and ventral cord mass effect. Severe left and moderate right C7 foraminal stenosis. C7-T1: Mild facet and endplate hypertrophy. No spinal stenosis. Mild bilateral C8 foraminal stenosis. No visible upper thoracic spinal stenosis. IMPRESSION: 1. Positive for two discrete cervical spinal cord lesions, both involving the right hemicord and the larger at C5. No cord expansion. No corresponding spinal stenosis at these levels. In light of the Brain findings today, Demyelinating Disease  may be most likely. Other inflammatory conditions are possible. The appearance does NOT suggest cord tumor. 2. Some underlying congenital spinal canal narrowing but relatively mild superimposed  degenerative disease. There is mild spinal stenosis at C4-C5 and C6-C7, with mild cord mass effect but no cord signal abnormality at the latter. Severe left and moderate right C7 neural foraminal stenosis. Electronically Signed   By: Genevie Ann M.D.   On: 02/19/2021 08:41   MR THORACIC SPINE WO CONTRAST  Result Date: 02/20/2021 CLINICAL DATA:  45 year old male with sudden onset bilateral blurred vision, extremity tingling, left side numbness. Hypertensive, systolic blood pressure 222'L. Right adrenal mass. Small cervical spinal cord lesions, the larger at C5. EXAM: MRI THORACIC SPINE WITHOUT CONTRAST TECHNIQUE: Multiplanar, multisequence MR imaging of the thoracic spine was performed. No intravenous contrast was administered. COMPARISON:  Head and cervical spine MRI yesterday. FINDINGS: Limited cervical spine imaging:  Reported yesterday. Thoracic spine segmentation:  Appears to be normal. Alignment: Mildly exaggerated upper thoracic kyphosis but no spondylolisthesis. Vertebrae: No marrow edema or evidence of acute osseous abnormality. Visualized bone marrow signal is within normal limits. Cord: Abnormal. More conspicuous today on sagittal T2 imaging (series 3, image 10) but apparent on sagittal STIR (series 4, image 10) and probably present in retrospect on series 7, image 7 yesterday is a patchy roughly 1 vertebral body length T2 and STIR hyperintense lesion within the upper thoracic cord. Little if any associated cord expansion. But there appear to be at least two other small T2 and STIR hyperintense lesions within the thoracic spine cord at T4-T5 and at T9 (also series 4, image 10). Axial spinal cord detail is limited due to motion. Cord volume remains normal. The conus medullaris is below T12-L1 and not identified. Paraspinal and other soft tissues: Known bilobed T2 hyperintense right adrenal mass (series 6, image 37). Visible thoracic viscera and thoracic paraspinal soft tissues appear normal. Disc levels:  There is perhaps mild congenital thoracic spinal canal narrowing due to short pedicle distance. And there is a mild degree of thoracic epidural lipomatosis. But thoracic spinal cord disc signal and morphology remain normal at all levels. No disc herniation or significant spinal stenosis. Occasional mild thoracic facet hypertrophy (such as T7-T8 greater on the right series 6, image 23). IMPRESSION: 1. Positive for a patchy, one vertebral body length T2/STIR hyperintense upper thoracic spinal cord lesion at T1, probably subtle but present on the Cervical STIR imaging yesterday. Little if any associated cord expansion. And at least two other small but similar thoracic cord lesions are identified. The constellation of brain and spinal cord findings favors demyelinating disease. 2. Borderline to mild generalized thoracic spinal stenosis related to short pedicle distance and mild epidural lipomatosis. But no thoracic disc degeneration. Intermittent mild thoracic facet hypertrophy. 3. Known Right Adrenal Mass is partially visible, T2 hyperintense. Electronically Signed   By: Genevie Ann M.D.   On: 02/20/2021 12:09   US RENAL  Result Date: 02/23/2021 CLINICAL DATA:  Acute renal injury. EXAM: RENAL / URINARY TRACT ULTRASOUND COMPLETE COMPARISON:  CT scan 03/18/2021 FINDINGS: Right Kidney: Renal measurements: 10.3 x 4.7 x 4.7 cm = volume: 138.2 mL. Normal renal cortical thickness. Mild diffuse increased echogenicity is noted. No worrisome renal lesions or hydronephrosis. There is a stable suprarenal measuring approximately 6.8 x 5.3 x 3.7 cm. This is also been seen on prior CT scans. Left Kidney: Renal measurements: 10.0 x 5.7 x 5.6 cm = volume: 167.5 mL. Normal renal cortical thickness. Slight increased echogenicity.  No worrisome renal lesions are identified. A few small renal cysts are noted. Bladder: Appears normal for degree of bladder distention. Other: None. IMPRESSION: 1. Increased echogenicity of both kidneys  suggesting medical renal disease. Normal renal cortical thickness and no worrisome renal lesions or hydronephrosis. 2. Stable bilobed suprarenal cyst on the right. 3. Normal bladder. Electronically Signed   By: Marijo Sanes M.D.   On: 02/23/2021 18:58   DG Chest Port 1 View  Result Date: 02/20/2021 CLINICAL DATA:  Hypertensive emergency EXAM: PORTABLE CHEST 1 VIEW COMPARISON:  08/08/2020 FINDINGS: Heart size and pulmonary vascularity are normal for technique. Mild hyperinflation in the lungs. Lungs are clear. No consolidation or airspace disease. No pleural effusions. No pneumothorax. Mediastinal contours appear intact. IMPRESSION: No active disease. Electronically Signed   By: Lucienne Capers M.D.   On: 02/20/2021 03:13   CT Angio Chest/Abd/Pel for Dissection W and/or Wo Contrast  Result Date: 02/19/2021 CLINICAL DATA:  45 year old male with abdominal pain, abnormal blood pressure, systolic greater than 767. New onset dizziness. Right adrenal lesion on CT in March. EXAM: CT ANGIOGRAPHY CHEST, ABDOMEN AND PELVIS TECHNIQUE: Non-contrast CT of the chest was initially obtained. Multidetector CT imaging through the chest, abdomen and pelvis was performed using the standard protocol during bolus administration of intravenous contrast. Multiplanar reconstructed images and MIPs were obtained and reviewed to evaluate the vascular anatomy. CONTRAST:  122mL OMNIPAQUE IOHEXOL 350 MG/ML SOLN COMPARISON:  CT Abdomen with oral contrast only 11/02/2020. renal ultrasound 10/11/2020. FINDINGS: CTA CHEST FINDINGS Cardiovascular: No calcified atherosclerosis in the chest. Normal thoracic aorta without dissection or aneurysm. Small volume pericardial effusion, including some simple density fluid in the superior pericardial recess. No coronary artery calcified atherosclerosis is evident. Central pulmonary arteries are also enhancing and appear to be patent. Mediastinum/Nodes: Negative.  No lymphadenopathy. Lungs/Pleura: Better  lung volumes on the initial noncontrast portion (series 4). Major airways are patent. Both lungs appear clear. No pleural effusion. No pulmonary nodule. Musculoskeletal: No acute or suspicious osseous lesion identified in the chest. Review of the MIP images confirms the above findings. CTA ABDOMEN AND PELVIS FINDINGS VASCULAR Normal caliber abdominal aorta as seen in March and following contrast there is no abdominal aortic dissection. The major aortic branches are patent and appear within normal limits. Normal aortoiliac bifurcation. Mild bilateral calcified iliac artery atherosclerosis. No iliac dissection. Iliac arteries remain patent. Proximal femoral arteries appear patent and normal. Review of the MIP images confirms the above findings. NON-VASCULAR Hepatobiliary: Contracted or absent gallbladder.  Negative liver. Pancreas: Negative. Spleen: Negative. Adrenals/Urinary Tract: Left adrenal gland remains normal. The right kidney appears separate from the right adrenal mass detailed below. The kidneys appears symmetric and nonobstructed. Decompressed ureters. Unremarkable bladder. Partially cystic and partially rim calcified bilobed right adrenal mass encompasses 75 x 37 by 42 mm (AP by transverse by CC) and does not appear significantly changed in size or configuration since March. No surrounding inflammation. No hyperenhancing components are evident on these early arterial phase images. Stomach/Bowel: Redundant sigmoid colon but otherwise negative large bowel. Normal appendix visible on series 7, image 220. No dilated small bowel. Unremarkable stomach and duodenum. No free air. No free fluid. Lymphatic: Negative.  No lymphadenopathy. Reproductive: Negative.  Incidental pelvic phleboliths. Other: There is trace free fluid suspected in the pelvis on series 7, image 266, simple fluid density. Musculoskeletal: Negative.  No acute or suspicious osseous lesion. Review of the MIP images confirms the above findings.  IMPRESSION: 1. Normal aorta. No dissection or aneurysm.  Mild calcified iliac artery atherosclerosis. 2. Cystic and calcified Right Adrenal Mass measuring up to 7.5 cm, size stable since March. In the setting of uncontrolled hypertension this is suspicious for Pheochromocytoma. 3. Mild Cardiomegaly with small Pericardial Effusion. 4. Trace free fluid in the pelvis is abnormal but nonspecific. Perhaps related to #2. 5. Otherwise negative CT appearance of the chest, abdomen, and pelvis. Electronically Signed   By: Genevie Ann M.D.   On: 02/19/2021 05:30   MR ORBITS WO CONTRAST  Result Date: 02/20/2021 CLINICAL DATA:  45 year old male with sudden onset bilateral blurred vision, extremity tingling, left side numbness. Hypertensive, systolic blood pressure 478'G. Right adrenal mass. EXAM: MRI OF THE ORBITS WITHOUT CONTRAST TECHNIQUE: Multiplanar, multi-echo pulse sequences of the orbits and surrounding structures were acquired including fat saturation techniques, without intravenous contrast administration. COMPARISON:  Noncontrast brain and cervical spine MRI yesterday. FINDINGS: Study is mildly degraded by motion artifact. Orbits: Capacious suprasellar cistern. Optic chiasm appears stable and within normal limits. Symmetric and normal noncontrast appearance of the cavernous sinuses. There is no asymmetric or abnormal T2 hyperintensity identified in the substance of the optic nerves. No evidence of intraorbital mass or inflammation on these noncontrast images. Symmetric superior ophthalmic veins. Globes appear stable since yesterday, within normal limits. Visualized bone marrow signal is within normal limits. Visualized sinuses: Visualized paranasal sinuses and mastoids are stable and well aerated. Soft tissues: Visible superficial and deep soft tissue spaces of the face appear symmetric and negative. Limited intracranial: Stable compared to the brain MRI yesterday. IMPRESSION: Mildly motion degraded but essentially  negative noncontrast MRI appearance of the orbits. Electronically Signed   By: Genevie Ann M.D.   On: 02/20/2021 11:47    Microbiology: Recent Results (from the past 240 hour(s))  Resp Panel by RT-PCR (Flu A&B, Covid) Nasopharyngeal Swab     Status: None   Collection Time: 02/19/21  4:36 AM   Specimen: Nasopharyngeal Swab; Nasopharyngeal(NP) swabs in vial transport medium  Result Value Ref Range Status   SARS Coronavirus 2 by RT PCR NEGATIVE NEGATIVE Final    Comment: (NOTE) SARS-CoV-2 target nucleic acids are NOT DETECTED.  The SARS-CoV-2 RNA is generally detectable in upper respiratory specimens during the acute phase of infection. The lowest concentration of SARS-CoV-2 viral copies this assay can detect is 138 copies/mL. A negative result does not preclude SARS-Cov-2 infection and should not be used as the sole basis for treatment or other patient management decisions. A negative result may occur with  improper specimen collection/handling, submission of specimen other than nasopharyngeal swab, presence of viral mutation(s) within the areas targeted by this assay, and inadequate number of viral copies(<138 copies/mL). A negative result must be combined with clinical observations, patient history, and epidemiological information. The expected result is Negative.  Fact Sheet for Patients:  EntrepreneurPulse.com.au  Fact Sheet for Healthcare Providers:  IncredibleEmployment.be  This test is no t yet approved or cleared by the [place] FDA and  has been authorized for detection and/or diagnosis of SARS-CoV-2 by FDA under an Emergency Use Authorization (EUA). This EUA will remain  in effect (meaning this test can be used) for the duration of the COVID-19 declaration under Section 564(b)(1) of the Act, 21 U.S.C.section 360bbb-3(b)(1), unless the authorization is terminated  or revoked sooner.       Influenza A by PCR NEGATIVE NEGATIVE Final    Influenza B by PCR NEGATIVE NEGATIVE Final    Comment: (NOTE) The Xpert Xpress SARS-CoV-2/FLU/RSV plus assay is intended as an  aid in the diagnosis of influenza from Nasopharyngeal swab specimens and should not be used as a sole basis for treatment. Nasal washings and aspirates are unacceptable for Xpert Xpress SARS-CoV-2/FLU/RSV testing.  Fact Sheet for Patients: EntrepreneurPulse.com.au  Fact Sheet for Healthcare Providers: IncredibleEmployment.be  This test is not yet approved or cleared by the [place] FDA and has been authorized for detection and/or diagnosis of SARS-CoV-2 by FDA under an Emergency Use Authorization (EUA). This EUA will remain in effect (meaning this test can be used) for the duration of the COVID-19 declaration under Section 564(b)(1) of the Act, 21 U.S.C. section 360bbb-3(b)(1), unless the authorization is terminated or revoked.  Performed at [facility], [place] [address] [place], [place] [address]   MRSA Next Gen by PCR, Nasal     Status: None   Collection Time: 02/19/21  8:00 PM   Specimen: Nasal Mucosa; Nasal Swab  Result Value Ref Range Status   MRSA by PCR Next Gen NOT DETECTED NOT DETECTED Final    Comment: (NOTE) The GeneXpert MRSA Assay (FDA approved for NASAL specimens only), is one component of a comprehensive MRSA colonization surveillance program. It is not intended to diagnose MRSA infection nor to guide or monitor treatment for MRSA infections. Test performance is not FDA approved in patients less than 64 years old. Performed at [facility], [place] [address] [place], [place] [address]      Labs: Basic Metabolic Panel: Recent Labs  Lab 02/20/21 0007 02/21/21 0021 02/21/21 1307 02/22/21 0147 02/23/21 0322 02/24/21 0244 02/25/21 0050  NA 133*   < > 131* 131* 134* 132* 132*  K 3.2*   < > 3.9 4.0 4.1 3.8 3.9  CL 106   < > 101 103 103 103 104  CO2 21*   < > 22 20* 20* 22 22   GLUCOSE 122*   < > 95 147* 147* 164* 177*  BUN 28*   < > 31* 36* 45* 51* 55*  CREATININE 3.02*   < > 3.39* 3.26* 3.43* 3.29* 3.58*  CALCIUM 8.6*   < > 8.9 9.3 9.3 8.9 8.7*  MG 2.1  --  2.3 2.2  --   --   --   PHOS 2.9  --   --   --   --   --   --    < > = values in this interval not displayed.   Liver Function Tests: Recent Labs  Lab 02/19/21 0335  AST 32  ALT 27  ALKPHOS 77  BILITOT 1.1  PROT 7.5  ALBUMIN 3.4*   No results for input(s): LIPASE, AMYLASE in the last 168 hours. No results for input(s): AMMONIA in the last 168 hours. CBC: Recent Labs  Lab 02/19/21 0335 02/19/21 0350 02/20/21 0007 02/21/21 0021 02/22/21 0147 02/24/21 0244  WBC 6.6  --  6.4 5.5 5.3 15.9*  NEUTROABS 4.2  --   --   --   --   --   HGB 12.3* 13.6 12.6* 10.8* 11.1* 11.0*  HCT 37.5* 40.0 37.4* 33.3* 34.1* 32.7*  MCV 72.4*  --  70.7* 71.8* 72.2* 71.2*  PLT 219  --  246 229 225 213   Cardiac Enzymes: No results for input(s): CKTOTAL, CKMB, CKMBINDEX, TROPONINI in the last 168 hours. BNP: BNP (last 3 results) Recent Labs    08/08/20 0600  BNP 134.8*    ProBNP (last 3 results) No results for input(s): PROBNP in the last 8760 hours.  CBG: Recent  Labs  Lab 02/24/21 0731 02/24/21 1124 02/24/21 1700 02/24/21 2240 02/25/21 0754  GLUCAP 256* 149* 135* 222* 164*       Signed:  Domenic Polite MD.  [facility] 02/25/2021, 1:07 PM

## 2021-02-25 NOTE — Progress Notes (Signed)
Nephrology Consult   Assessment/Recommendations: George Day is a/an 45 y.o. male with a past medical history of hypertension, CKD 3 who presented to the hospital for severe symptomatic hypotension, which required nicardipine drip, and transition to p.o. medication.  Also found to have multiple sclerosis on imaging, which Solu-Medrol was started by neurology.  Nephrology was consulted for AKI.  Serum creatinine trending down.   Non-Oliguric AKI CKD 3B Serum creatinine trending down.  Patient still has good urine output.  Multiple etiologies for his AKI: Hypertensive nephropathy plus HCTZ.  Can also be normotensive AKI given his longstanding of uncontrolled hypertension.  Renal ultrasound did not show any obstruction.  -Continue holding spironolactone.  If becomes hypertensive, can restart spironolactone. -Cr slightly higher today, from 3.3 to 3.6 but nonoliguric with no uremic symptoms. Would be okay with discharge from a nephrology perspective today but will set him up for labs at our office this week and a follow up appointment in the office. There is a chance that this is around his new baseline kidney function -Unfortunately, has a lot of social issues at home. He is getting evicted and has to move to [place] before the end of the month for family support.  -Avoid further nephrotoxins including NSAIDS, Morphine.  Unless absolutely necessary, avoid CT with contrast and/or MRI with gadolinium.    -Continue to monitor daily Cr, Dose meds for GFR<15 -Monitor Daily I/Os, Daily weight    Severe symptomatic hypertension TSH within normal limits.  Pending pheochromocytoma labs.  Aldo/renin ratio within normal limits. 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  Pending pheochromocytoma labs.  Patient has a referral to surgery.   Multiple sclerosis Solu-Medrol per neurology, today is day 5/5 of steroids  [facility] 02/25/2021 10:08 AM   _____________________________________________________________________________________   History of Present Illness: Seen and examined this am. No acute events. Feels a lot better. Has been more mobile with cane. Urinating more. Unable to measure all of his voids now that he is using the toilet. Very eager to go home today. Denies any orthostatic dizziness, swelling, SOB   Medications:  Current Facility-Administered Medications  Medication Dose Route Frequency Provider Last Rate Last Admin   acetaminophen (TYLENOL) tablet 650 mg  650 mg Oral Q6H PRN Domenic Polite, MD   650 mg at 02/24/21 1053   amLODipine (NORVASC) tablet 10 mg  10 mg Oral Daily Ollis, Brandi L, NP   10 mg at 02/25/21 0904   carvedilol (COREG) tablet 25 mg  25 mg Oral BID WC Jacky Kindle, MD   25 mg at 02/25/21 0904   docusate sodium (COLACE) capsule 100 mg  100 mg Oral BID PRN Noe Gens L, NP       heparin injection 5,000 Units  5,000 Units Subcutaneous Q8H Ollis, Brandi L, NP   5,000 Units at 02/25/21 0640   hydrALAZINE (APRESOLINE) tablet 10 mg  10 mg Oral Q8H Gaylan Gerold, DO   10 mg at 02/25/21 0640   insulin aspart (novoLOG) injection 0-15 Units  0-15 Units Subcutaneous TID WC Debbe Odea, MD   3 Units at 02/25/21 0904   labetalol (NORMODYNE) injection 10 mg  10 mg Intravenous Q2H PRN Opyd, Ilene Qua, MD   10 mg at 02/19/21 0948   methylPREDNISolone sodium succinate (SOLU-MEDROL) 1,000 mg in sodium chloride 0.9 % 50 mL IVPB  1,000 mg Intravenous Q24H Greta Doom, MD 58 mL/hr at 02/24/21 1816 1,000 mg at 02/24/21 1816  ondansetron (ZOFRAN) injection 4 mg  4 mg Intravenous Q6H PRN Noe Gens L, NP   4 mg at 02/20/21 0139   pantoprazole (PROTONIX) EC tablet 40 mg  40 mg Oral Q1200 Domenic Polite, MD   40 mg at 02/24/21 1025   polyethylene glycol (MIRALAX / GLYCOLAX) packet 17 g  17 g Oral Daily PRN Donita Brooks, NP       Vitamin D (Ergocalciferol) (DRISDOL)  capsule 50,000 Units  50,000 Units Oral Q7 days Domenic Polite, MD   50,000 Units at 02/24/21 1418     ALLERGIES Penicillins and Tomato  MEDICAL HISTORY Past Medical History:  Diagnosis Date   Anemia 1998   Anginal pain ([place])    Anxiety    Asthma    Coronary artery disease    Diabetes mellitus without complication ([place]) 4174   Hypertension 2019   MI (myocardial infarction) ([place]) 2010   Stroke [facility]) 2010     SOCIAL HISTORY Social History   Socioeconomic History   Marital status: Single    Spouse name: Not on file   Number of children: 3   Years of education: Not on file   Highest education level: Not on file  Occupational History   Not on file  Tobacco Use   Smoking status: Some Days    Packs/day: 0.50    Years: 31.00    Pack years: 15.50    Types: Cigars, E-cigarettes, Cigarettes   Smokeless tobacco: Never  Vaping Use   Vaping Use: Never used  Substance and Sexual Activity   Alcohol use: Yes    Comment: occasioanlly    Drug use: Never   Sexual activity: Yes    Birth control/protection: None  Other Topics Concern   Not on file  Social History Narrative   Not on file   Social Determinants of Health   Financial Resource Strain: Not on file  Food Insecurity: Not on file  Transportation Needs: Not on file  Physical Activity: Not on file  Stress: Not on file  Social Connections: Not on file  Intimate Partner Violence: Not on file     FAMILY HISTORY Family History  Problem Relation Age of Onset   Diabetes Mother    Hyperlipidemia Mother    Diabetes Sister    Hyperlipidemia Sister    Hearing loss Sister    Diabetes Brother    Hyperlipidemia Brother      No family history of kidney disease  Review of Systems: 12 systems reviewed Otherwise as per HPI, all other systems reviewed and negative  Physical Exam: Vitals:   02/25/21 0610 02/25/21 0818  BP: (!) 147/95 (!) 153/92  Pulse: 75 74  Resp: 17 17  Temp: 97.9 F (36.6 C) 97.9 F (36.6 C)   SpO2: 98% 100%   No intake/output data recorded.  Intake/Output Summary (Last 24 hours) at 02/25/2021 1008 Last data filed at 02/24/2021 2145 Gross per 24 hour  Intake 356 ml  Output --  Net 356 ml   General: well-appearing, no acute distress HEENT: anicteric sclera, oropharynx clear without lesions CV: regular rate, normal rhythm, no murmurs, no gallops, no rubs, trace LE edema Lungs: clear to auscultation bilaterally, normal work of breathing Skin: no visible lesions or rashes Psych: alert, engaged, appropriate mood and affect Musculoskeletal: no obvious deformities Neuro: normal speech, no gross focal deficits   Test Results Reviewed Lab Results  Component Value Date   NA 132 (L) 02/25/2021   K 3.9 02/25/2021   CL 104  02/25/2021   CO2 22 02/25/2021   BUN 55 (H) 02/25/2021   CREATININE 3.58 (H) 02/25/2021   GFR 25.35 (L) 02/15/2021   CALCIUM 8.7 (L) 02/25/2021   ALBUMIN 3.4 (L) 02/19/2021   PHOS 2.9 02/20/2021     I have reviewed all relevant outside healthcare records related to the patient's kidney injury.

## 2021-02-25 NOTE — Progress Notes (Signed)
Subjective: No more paresthesia.   Exam: Vitals:   02/25/21 0610 02/25/21 0818  BP: (!) 147/95 (!) 153/92  Pulse: 75 74  Resp: 17 17  Temp: 97.9 F (36.6 C) 97.9 F (36.6 C)  SpO2: 98% 100%   Gen: In bed, NAD Resp: non-labored breathing, no acute distress Abd: soft, nt  Neuro: MS: awake, alert interactive and appropriate CN:VFF, EOMI Motor: 5/5 in BUE, he has 4+/5 weakness in the RLE, improved from admission.  Sensory:Decreased to LT and temp in the right leg, but again improved compared to admission.  DTR:3+ in the right patella, 2+ in the left.    Impression: 45 yo M with bilateral paresthesias, blurred vision, hypertensive emergency.  His vision I suspect was secondary to the hypertension.  His bilateral paresthesias are most likely result of the cervical spine lesion.    He has had previous clinical episodes which I think are referable to the thoracic lesions, making this by far most consistent with multiple sclerosis.  Most of his brain lesions are more consistent with small vessel disease, but there is a periventricular lesion that I think could be MS related.  At this time, I think that multiple sclerosis is by far the most likely diagnosis.  With the presence of multiple lesions, I think that spinal cord ischemia is essentially ruled out.  Recommendations: 1) Solumedrol, day 5/5 2)  follow up with outpatient neurology   Roland Rack, MD [facility] [phone]  If 7pm- 7am, please page neurology on call as listed in [place].

## 2021-02-27 ENCOUNTER — Telehealth: Payer: Self-pay | Admitting: Internal Medicine

## 2021-02-27 ENCOUNTER — Telehealth: Payer: Self-pay

## 2021-02-27 NOTE — Telephone Encounter (Signed)
Transition Care Management Follow-up Telephone Call Call received from patient  Date of discharge and from where: 02/25/2021, [facility]  How have you been since you were released from the hospital? He said he is feeling really good.  Any questions or concerns? Yes -  Difficulties with medications noted below. He said that he applied for a Blue Card but no one called him back to he is not sure what happened. Informed him that this CM would ask Wynonia Hazard, Financial Counselor to contact him. He also explained that he is being evicted and is moving back to [facility] in the next 1-2 weeks.  He is requesting medical records from his recent hospital stay. Informed him that he needs to call medical records at [facility].  He has been approved for [facility] and stated that he has contacted DSS about having this switched to regular medicaid.   Items Reviewed: Did the pt receive and understand the discharge instructions provided? Yes  Medications obtained and verified?  He said that the only medication he has is amlodipine. He does not have any of the other medications. He is having trouble with transportation.  Explained to him that he can call the floor he was discharged from and request that the prescriptions be sent to [facility] or the [facility] near his home and he said he understood.  [facility] can mail the medications to him if he so chooses.  Other? No  Any new allergies since your discharge? No  Do you have support at home? Yes - his fiance  Home Care and Equipment/Supplies: Were home health services ordered? no If so, what is the name of the agency? N/a  Has the agency set up a time to come to the patient's home? not applicable Were any new equipment or medical supplies ordered?  No What is the name of the medical supply agency? N/a Were you able to get the supplies/equipment? not applicable Do you have any questions related to the use of the equipment or supplies?  No  Functional Questionnaire: (I = Independent and D = Dependent) ADLs: independent - he said that he is not ambulating with an assistive device but he would feel better using a cane. He explained that he was told by the hospital staff that he could purchase one at [facility].     Follow up appointments reviewed:  PCP Hospital f/u appt confirmed?  He didn't want to schedule an appointment at this time.  [facility] f/u appt confirmed? Yes  Scheduled to see cardiology  04/25/2021; endocrinology- 05/19/2021. Need follow up scheduled with neurology.   Are transportation arrangements needed?  Possibly.  He and his family are planning to rent a car to return to [facility].  If their condition worsens, is the pt aware to call PCP or go to the Emergency Dept.? Yes Was the patient provided with contact information for the PCP's office or ED? Yes Was to pt encouraged to call back with questions or concerns? Yes

## 2021-02-27 NOTE — Telephone Encounter (Signed)
Transition Care Management Unsuccessful Follow-up Telephone Call  Date of discharge and from where:  02/25/2021, [facility]   Attempts:  1st Attempt  Reason for unsuccessful TCM follow-up call:  Left voice message on [phone].

## 2021-02-27 NOTE — Telephone Encounter (Signed)
I call the Pt George Day to call back to schedule a financial appt

## 2021-02-28 LAB — CORTISOL, URINE, FREE
Cortisol (Ur), Free: 27 ug/24 hr (ref 5–64)
Cortisol,F,ug/L,U: 9 ug/L
Total Volume: 3000

## 2021-02-28 LAB — METANEPHRINES, URINE, 24 HOUR
Metaneph Total, Ur: 34 ug/L
Metanephrines, 24H Ur: 31 ug/24 hr — ABNORMAL LOW (ref 58–276)
Normetanephrine, 24H Ur: 380 ug/24 hr (ref 156–729)
Normetanephrine, Ur: 422 ug/L
Total Volume: 900

## 2021-03-01 LAB — METANEPHRINES, PLASMA
Metanephrine, Free: 23 pg/mL (ref 0.0–88.0)
Normetanephrine, Free: 565.3 pg/mL — ABNORMAL HIGH (ref 0.0–218.9)

## 2021-03-02 LAB — CATECHOLAMINES,UR.,FREE,24 HR
Dopamine, Rand Ur: 46 ug/L
Dopamine, Ur, 24Hr: 138 ug/24 hr (ref 0–510)
Epinephrine, Rand Ur: <1 ug/L
Epinephrine, U, 24Hr: <3 ug/24 hr (ref 0–20)
Norepinephrine, Rand Ur: 17 ug/L
Norepinephrine,U,24H: 51 ug/24 hr (ref 0–135)
Total Volume: 3000

## 2021-03-06 ENCOUNTER — Encounter: Payer: Self-pay | Admitting: Internal Medicine

## 2021-03-06 ENCOUNTER — Telehealth: Payer: Self-pay | Admitting: Internal Medicine

## 2021-03-06 DIAGNOSIS — J4540 Moderate persistent asthma, uncomplicated: Secondary | ICD-10-CM

## 2021-03-06 DIAGNOSIS — Z87891 Personal history of nicotine dependence: Secondary | ICD-10-CM

## 2021-03-06 DIAGNOSIS — I25118 Atherosclerotic heart disease of native coronary artery with other forms of angina pectoris: Secondary | ICD-10-CM

## 2021-03-06 NOTE — Telephone Encounter (Signed)
Received notification of patient's metanephrine levels, suggestive of increased level with known adrenal mass suspicion of pheochromocytoma is high on the list.  Called patient, I spoke with him, he stated that he has an appointment with the general surgery at [facility] but he stated that he may not make it to that appointment.  Reinforced that this tumor with increasing metanephrine level can be due to pheochromocytoma and he may require surgery, he stated that he will speak with his primary care physician so that he can have referral to general surgery here in [place]. Also patient stated that he has not picked up his blood pressure medications, again he was reinforced to pick up his antihypertensive and take it regularly considering he has high suspicion of pheochromocytoma and suddenly spike in blood pressure can have delirious effects including intracranial hemorrhage. Patient understood and stated he is calling primary care physician to expedite general surgery appointment here in [facility] and he stated he is going to pick up his antihypertensive medications today.    Jacky Kindle MD [facility] See Amion for pager If no response to pager, please call [phone] until 7pm After 7pm, Please call E-link [phone]

## 2021-03-07 ENCOUNTER — Ambulatory Visit: Payer: Medicaid Other | Admitting: General Surgery

## 2021-03-07 ENCOUNTER — Other Ambulatory Visit: Payer: Self-pay

## 2021-03-07 ENCOUNTER — Telehealth: Payer: Self-pay | Admitting: Internal Medicine

## 2021-03-07 MED ORDER — ATORVASTATIN CALCIUM 40 MG PO TABS
40.0000 mg | ORAL_TABLET | Freq: Every day | ORAL | 1 refills | Status: DC
  Filled 2021-03-07: qty 30, 30d supply, fill #0

## 2021-03-07 MED ORDER — HYDRALAZINE HCL 10 MG PO TABS
10.0000 mg | ORAL_TABLET | Freq: Three times a day (TID) | ORAL | 1 refills | Status: DC
  Filled 2021-03-07: qty 90, 30d supply, fill #0

## 2021-03-07 MED ORDER — FLUTICASONE-SALMETEROL 250-50 MCG/ACT IN AEPB
1.0000 | INHALATION_SPRAY | Freq: Two times a day (BID) | RESPIRATORY_TRACT | 1 refills | Status: DC
  Filled 2021-03-07: qty 60, 30d supply, fill #0

## 2021-03-07 MED ORDER — CARVEDILOL 25 MG PO TABS
25.0000 mg | ORAL_TABLET | Freq: Two times a day (BID) | ORAL | 1 refills | Status: DC
  Filled 2021-03-07: qty 60, 30d supply, fill #0

## 2021-03-07 MED ORDER — AMLODIPINE BESYLATE 10 MG PO TABS
10.0000 mg | ORAL_TABLET | Freq: Every day | ORAL | 1 refills | Status: DC
  Filled 2021-03-07: qty 30, 30d supply, fill #0

## 2021-03-07 NOTE — Addendum Note (Signed)
Addended by: Charlott Rakes on: 03/07/2021 05:47 PM   Modules accepted: Orders

## 2021-03-07 NOTE — Telephone Encounter (Signed)
Refills have been sent to the Pharmacy

## 2021-03-07 NOTE — Telephone Encounter (Signed)
I called the Pt to schedule a financial appt, LVM to call us back and to please make sure he has the application and documentation he need to qualified

## 2021-03-07 NOTE — Telephone Encounter (Signed)
Can you please refer to the message below?  Please advise this patient that he needs to keep his appointment with general surgery in [place].  A new referral to a general surgeon will put him further out for about several months which can be life-threatening with regards to his emergency condition.  It also looks like he lacks medical coverage and this could be a major barrier.  Thanks.

## 2021-03-08 ENCOUNTER — Other Ambulatory Visit: Payer: Self-pay

## 2021-03-08 NOTE — Telephone Encounter (Signed)
Call placed to patient and informed him that Dr Margarita Rana sent prescriptions for his medications to [facility] and he is eligible for a one time free fill of the medications. He said he has no one to pick them up for him and he would like them delivered. This CM stressed the importance of getting these medications as soon as possible, especially if he is moving. Provided him with the phone number to call [facility] and arrange delivery. He said that he is not leaving for [place] until the end of next week and will receive them by the time he leaves.

## 2021-03-09 ENCOUNTER — Other Ambulatory Visit: Payer: Self-pay

## 2021-04-25 ENCOUNTER — Ambulatory Visit (HOSPITAL_BASED_OUTPATIENT_CLINIC_OR_DEPARTMENT_OTHER): Payer: Self-pay | Admitting: Cardiovascular Disease

## 2021-05-19 ENCOUNTER — Encounter: Payer: Self-pay | Admitting: General Surgery

## 2021-05-19 ENCOUNTER — Ambulatory Visit: Payer: Medicaid Other | Admitting: Internal Medicine

## 2021-10-03 ENCOUNTER — Other Ambulatory Visit: Payer: Self-pay | Admitting: Gastroenterology

## 2021-12-20 ENCOUNTER — Other Ambulatory Visit: Payer: Self-pay | Admitting: Gastroenterology

## 2022-01-03 DIAGNOSIS — G35 Multiple sclerosis: Secondary | ICD-10-CM

## 2022-04-16 ENCOUNTER — Encounter: Payer: Self-pay | Admitting: Gastroenterology

## 2022-04-17 ENCOUNTER — Telehealth: Payer: Self-pay

## 2022-04-17 NOTE — Telephone Encounter (Signed)
Referral received to Advanced HF Program from [facility].   Referring Provider: Azucena Freed, PA  Office Contact:   Office Phone: [phone]  Records Received: 04/10/22  PCP: Tawana Scale, NP  Records received & sent for urgent scanning. - Forwarded for clinical and financial review.

## 2022-04-17 NOTE — Telephone Encounter (Signed)
Patient has active [employer] Medicaid authorization is not required for HF Eval. Ok to Eval.

## 2022-04-17 NOTE — Telephone Encounter (Signed)
Referral Type (HF Only / Advanced Therapy): HF - amyloidosis  Testing Needed: TBD  Attending Requested: first avail  Primary Dx: LVH, echo consistent with amyloidosis  Urgency: routine  Comments: Please obtain echo images.    I spoke to referring office and asked them to get SPEP, UPEP, urine and serum immunofixation, serum free light chains drawn.  Would request those results prior to his appt with our office as they help determine next steps in the work up for amyloid.      Jadasia Haws [place], NP

## 2022-04-18 ENCOUNTER — Encounter: Payer: Self-pay | Admitting: Gastroenterology

## 2022-04-18 LAB — URINALYSIS WITH REFLEX TO MICROSCOPIC
Bilirubin,Ur: NEGATIVE
Epithelial Cells (Renal) Ur: NONE SEEN /hpf
Glucose, Ur: NEGATIVE MG/DL
Hyaline Casts,UA: NONE SEEN /lpf
Ketones: NEGATIVE
Leuk Esterase,UA: NEGATIVE
Nirtire Urine: NEGATIVE
Protein,UR: 300 mg/dL
Specific Gravity,UA: 1.015
Urobilinogen,UA: NORMAL mg/dL — AB
pH,UA: 6.5

## 2022-04-18 LAB — NT-PRO BNP: NT-pro BNP: 249 pg/mL

## 2022-04-18 LAB — COMPREHENSIVE METABOLIC PANEL
ALT: 27 U/L
AST: 19 U/L
Albumin: 3.8 g/dL
Alk Phos: 94 U/L
Anion Gap: 10
Bilirubin,Total: 0.7 mg/dL
CO2: 29 mmol/L
Calcium: 8.9 mg/dL
Chloride: 108 mmol/L
Creatinine: 3.4 mg/dL
GFR,Caucasian: 21.7 mL/min
Glucose: 93 mg/dL
Lab: 34 mg/dL
Potassium: 3.8 mmol/L
Sodium: 143 mmol/L
Total Protein: 7.1 g/dL

## 2022-04-18 LAB — CBC AND DIFFERENTIAL
Baso # K/uL: 0.02 10*3/uL
Basophil %: 0.5 %
Eos # K/uL: 0.2 10*3/uL
Eosinophil %: 4.4 %
Hematocrit: 34.6 %
Hemoglobin: 11.3 g/dL
Lymph # K/uL: 1.3 10*3/uL
Lymphocyte %: 31.6 %
MCV: 72.1 fL
Mono # K/uL: 0.29 10*3/uL
Monocyte %: 7.1 %
Neut # K/uL: 2.3 10*3/uL
Platelets: 231 10*3/uL
RBC: 4.8 MIL/uL
RDW: 16.1 %
Seg Neut %: 56.4 %
WBC: 4.08 10*3/uL

## 2022-04-18 LAB — TIBC
% Iron Saturation: 20 %
Bilirubin,Total: 0.7 mg/dL
Iron: 55 ug/dL
TIBC: 280 ug/dL

## 2022-04-18 LAB — FERRITIN
Ferritin: 33.9 ng/mL
Iron: 55 ug/dL

## 2022-04-18 LAB — VITAMIN B12: Vitamin B12: 748 pg/mL

## 2022-04-18 LAB — TSH: TSH: 1.27 u[IU]/mL

## 2022-04-18 LAB — FOLATE: Folate: 18.18 ng/mL

## 2022-04-19 ENCOUNTER — Other Ambulatory Visit: Payer: Self-pay | Admitting: Gastroenterology

## 2022-04-28 ENCOUNTER — Encounter: Payer: Self-pay | Admitting: Physician Assistant

## 2022-04-28 NOTE — Telephone Encounter (Signed)
Lab results from 04/18/22 & limited echo report from 04/19/22 received from referring office.  Labs entered, do not obtain results for requested labs (SPEP, UPEP, urine and serum immunofixation, serum free light chains).  Both reports sent for scanning, will follow up with referring office for updated information on Monday.

## 2022-05-03 NOTE — Telephone Encounter (Signed)
I spoke to referring office. He had the blood work that I requested. They will fax the results to [place], Attn: Jessica.    John Gaudin Rockwell [place], NP

## 2022-05-03 NOTE — Telephone Encounter (Addendum)
Received the lab work today and they have been sent for urgent scanning.  Updating referral as ready to schedule.     Also, called the [facility] imaging department to request the echo images needed.  Had to leave a VM.

## 2022-05-08 NOTE — Telephone Encounter (Signed)
Kim from Dr. Tobi Bastos office calling for status of referral.  She is aware referral ready for scheduling.  Thank you.

## 2022-05-14 NOTE — Telephone Encounter (Signed)
Patient is scheduled for Friday 10/13 at 9:30am with AL.     Also called the office of Dr. Mosetta Putt to let them know of the date & time.  Jerrye Beavers will let Dr. Mosetta Putt know.

## 2022-05-21 ENCOUNTER — Encounter: Payer: Self-pay | Admitting: Transplant

## 2022-05-30 ENCOUNTER — Telehealth: Payer: Self-pay

## 2022-05-30 NOTE — Telephone Encounter (Signed)
Returned call back to [employer] states he never said he wanted his appointment cancelled and he does not have any insurance issues.      Patient's appointment is still for Friday 10/13 at 9:30am with AL.

## 2022-05-30 NOTE — Telephone Encounter (Signed)
John Chang called pt to confirm his app on 10/13 he wants to cancel to make sure his insurance will cover the bill. Please call pt back to reschedule appt.

## 2022-05-31 NOTE — Progress Notes (Unsigned)
Heart Failure and Transplant Service - New Patient Visit Notation;         HPI    CHIEF COMPLAINT:  No chief complaint on file.      SUBJECTIVE:  [name] is a 46 y.o. male, with hx of HTN, HLD, former tobacco abuse, daily marijuana use, HFpEF, CKD st IV, chronic anemia, multiple sclerosis, COPD, hx of MI in 2018 with CVA, pericardial effusion who presents here to establish care.   He was admitted in 09/2021 with uncontrolled HTN and during that time was found to have severe LVH, reduced strain with pattern consistent with amyloidosis, small to mod pericardial effusion. Based on his cardiologist note his BP is usually normal when he takes medications however he has not been consistent in taking the medications. He is on amlodipine, carvedilol, hydralazine, torsemide, clonidine.   Nicotine/tobacco use: ***  Alcohol use: ***  Drug use: ***  COVID Testing; {YES/NO:21037}  Seasonal Flu Vaccine; {Yes/No:21019459}  Lives with: ***  Currently employed: {YES/NO:21037}    There are no problems to display for this patient.      PAST MEDICAL HISTORY:  No past medical history on file.    PAST SURGICAL HISTORY:  No past surgical history on file.    HOME MEDICATIONS:  Current Outpatient Medications   Medication Sig    albuterol HFA (PROVENTIL, VENTOLIN, PROAIR HFA) 108 (90 Base) MCG/ACT inhaler Inhale 2 puffs into the lungs every 2 hours as needed for Wheezing  Shake well before each use.    amLODIPine (NORVASC) 10 mg tablet Take 1 tablet (10 mg total) by mouth daily    aspirin 81 mg EC tablet Take 1 tablet (81 mg total) by mouth daily    atorvastatin (LIPITOR) 40 mg tablet Take 1 tablet (40 mg total) by mouth daily    carvedilol (COREG) 25 mg tablet Take 1 tablet (25 mg total) by mouth 2 times daily (with meals)    cloNIDine (CATAPRES) 0.1 mg tablet Take 1 tablet (0.1 mg total) by mouth 2 times daily    DULoxetine (CYMBALTA) 20 mg DR capsule Take 1 capsule (20 mg total) by mouth 2 times daily     fluticasone-salmeterol (ADVAIR/WIXELA) 250-50 MCG/ACT diskus inhaler Inhale 1 puff into the lungs 2 times daily    fluticasone (FLONASE) 50 MCG/ACT nasal spray Spray 2 sprays into nostril daily    hydrALAZINE (APRESOLINE) 50 mg tablet Take 1 tablet (50 mg total) by mouth every 6 hours    nitroglycerin (NITROSTAT) 0.6 MG SL tablet Place 1 tablet (0.6 mg total) under the tongue every 5 minutes as needed for Chest pain  May repeat 2 times then call 911 if pain persists.    torsemide (DEMADEX) 20 mg tablet Take 2 tablets (40 mg total) by mouth every morning       ALLERGIES:  Not on File    ROS:  A complete 10 point ROS was performed and is negative except for those findings in HPI    OBJECTIVE:    VITAL SIGNS:  There were no vitals filed for this visit. There is no height or weight on file to calculate BMI. There is no height or weight on file to calculate BSA.  Wt Readings from Last 4 Encounters:   No data found for Wt       PHYSICAL EXAM:  General: Alert and Oriented in NAD.  HEENT: MMM, no oropharyngeal lesions  Neck: supple, JVP ***cmH20  Cardiac: RRR, S1S2, {yes no:314532::"yes"} murmurs, NO  gallop, or rubs  Lungs: clear to auscultation bilaterally with good excursion  Abdomen: soft, non-tender, non-distended, BS Pos x 4, no hepatomegaly  Extremities: pink, warm and dry with *** edema  Neuro: grossly intact    LABORATORY TESTING:    Lab Results   Component Value Date/Time    WBC 4.08 04/18/2022 08:15 AM    HGB 11.3 04/18/2022 08:15 AM    HCT 34.60 04/18/2022 08:15 AM    PLT 231 04/18/2022 08:15 AM       Lab Results   Component Value Date/Time    NA 143 04/18/2022 08:15 AM    K 3.8 04/18/2022 08:15 AM    CL 108 04/18/2022 08:15 AM    CO2 29 04/18/2022 08:15 AM    UN 34 04/18/2022 08:15 AM    CREAT 3.40 04/18/2022 08:15 AM    GFRC 21.7 04/18/2022 08:15 AM    GLU 93 04/18/2022 08:15 AM    CA 8.9 04/18/2022 08:15 AM    TP 7.1 04/18/2022 08:15 AM    ALB 3.8 04/18/2022 08:15 AM    TB 0.7 04/18/2022 08:15 AM    HTBIL 0.7  04/18/2022 08:15 AM    AST 19 04/18/2022 08:15 AM    ALT 27 04/18/2022 08:15 AM    ALK 94 04/18/2022 08:15 AM       Labs 04/18/2022  Ferritin 33, Iron 55, TIBC 280, %Sat 20  NT pro BNP 249  TSH 1.27  Free kappa light chains urine 15; Free lambda light chains urine 6.17  IFE pattern appears unremarkable. Evidence of monoclonal protein is not apparent.   Urine IFE Normal pattern. No monoclonal proteins detected.   LDL 75    External tests    TTE 04/19/2022:  Small to mod sized predominantly ant circumferential pericardial effusion. Compared to before there is a decrease in pericardial effusion size.     TTE 10/03/2021:  LV normal size. LV systolic function is mildly decreased. Mod concentric LVH. Mild global HK. LVEF 50%. RV normal size and function. Normal RV wall thickness. LA mod dilated. RA normal size. Along the free wall of the RV there is small pericardial effusion. No evidence of tamponade.     Brain MRI Periventricular white matter disease with dominant lesion in the right frontal lobe consistent with multiple sclerosis.     ASSESSMENT:  [name] is a 46 y.o. male     NYHA Functional Class = {NUMBERS ROMAN 1 - 4:23765}  AHA/ACC Cardiomyopathy Stage = {ACC/AHA staging:19978008}    Heart Failure Treatment:  ACE Inhibitor/ARB: {ACE/ARB/ARNI:38402}  Beta blocker: {HFBB:38403}  Aldosterone antagonist: {MRA:38404}  SGLT2 Inhibitor: {SGLT2i:38405}  ICD: {AICD:36752}  Activity Recommendation: Walk at a {exercise:38406} intensity, for *** minutes, *** days a week  Patient on Anticoagulation: ***    ADVANCE CARE PLANNING:  Discussion with provider regarding goals of care:  {YES/NO:21037}  DNI:  {YES/NO:21037}   DNR:  {YES/NO:21037}  MOLST in Chart:  {Responses; yes/no/unknown:74}  Health Care Proxy:  {Responses; yes/no/unknown:74}  Health Care Proxy Name/Relationship:  ***        PLAN:  There are no Patient Instructions on file for this visit.        NEW MEDICATIONS ORDERED THIS ENCOUNTER:  New Prescriptions     No medications on file         We appreciate the opportunity to participate in the care of [name].  Please do not hesitate to contact us for any questions or concerns (  Main office number [phone]).    Author: Katrine Coho, MD  Note created: 05/31/2022  at: 10:53 AM

## 2022-06-01 ENCOUNTER — Ambulatory Visit: Payer: MEDICAID | Attending: Transplant | Admitting: Transplant

## 2022-06-01 ENCOUNTER — Encounter: Payer: Self-pay | Admitting: Transplant

## 2022-06-01 ENCOUNTER — Other Ambulatory Visit: Payer: Self-pay

## 2022-06-01 ENCOUNTER — Emergency Department: Admission: EM | Admit: 2022-06-01 | Discharge status: Absent without leave | Payer: MEDICAID

## 2022-06-01 VITALS — BP 224/117 | HR 68 | Temp 97.7°F | Resp 16 | Ht 69.0 in | Wt 219.0 lb

## 2022-06-01 DIAGNOSIS — I5032 Chronic diastolic (congestive) heart failure: Secondary | ICD-10-CM | POA: Insufficient documentation

## 2022-06-01 DIAGNOSIS — I160 Hypertensive urgency: Secondary | ICD-10-CM | POA: Insufficient documentation

## 2022-06-01 DIAGNOSIS — E785 Hyperlipidemia, unspecified: Secondary | ICD-10-CM | POA: Insufficient documentation

## 2022-06-01 DIAGNOSIS — G4733 Obstructive sleep apnea (adult) (pediatric): Secondary | ICD-10-CM | POA: Insufficient documentation

## 2022-06-01 DIAGNOSIS — I10 Essential (primary) hypertension: Secondary | ICD-10-CM | POA: Insufficient documentation

## 2022-06-01 DIAGNOSIS — Z789 Other specified health status: Secondary | ICD-10-CM

## 2022-06-01 DIAGNOSIS — I429 Cardiomyopathy, unspecified: Secondary | ICD-10-CM | POA: Insufficient documentation

## 2022-06-01 DIAGNOSIS — I43 Cardiomyopathy in diseases classified elsewhere: Secondary | ICD-10-CM | POA: Insufficient documentation

## 2022-06-01 DIAGNOSIS — I517 Cardiomegaly: Secondary | ICD-10-CM | POA: Insufficient documentation

## 2022-06-01 DIAGNOSIS — J449 Chronic obstructive pulmonary disease, unspecified: Secondary | ICD-10-CM | POA: Insufficient documentation

## 2022-06-01 DIAGNOSIS — I119 Hypertensive heart disease without heart failure: Secondary | ICD-10-CM | POA: Insufficient documentation

## 2022-06-01 NOTE — Progress Notes (Signed)
Pt completed Health Care Proxy form in office visit today. Copy was sent to scan to EMR.

## 2022-06-01 NOTE — Patient Instructions (Signed)
-  Your BP is extremely high today. Please present to the ED for treatment of BP  -Obtain cardiac MRI in 3 months  -I will follow up with you in 1 month with televisit.   -Please keep a log of BP measurements so that we can discuss medication changes.  -Low sodium diet  -Walk daily for 20 min 5 days/7

## 2022-06-01 NOTE — ED Notes (Signed)
06/01/22 1115   Expected Patient   Date of notification 06/01/22   Time Notified 1115   Notified by Office/PCP   ED Service Adult Call-in   Pt Info note/Reason for sending C/o HTN.   Patient coming from MD Office   Office name HF clinic   Expected Call-In Information   Pulse 68   BP (!) 240/130   SP02 97   Requested Evaluation By Adult ED   Report called to See call in

## 2022-06-12 LAB — EKG 12-LEAD
P: 69 deg
PR: 172 ms
QRS: 7 deg
QRSD: 91 ms
QT: 426 ms
QTc: 460 ms
Rate: 70 {beats}/min
T: -4 deg

## 2022-07-02 ENCOUNTER — Ambulatory Visit: Payer: Medicaid (Managed Care) | Admitting: Transplant

## 2022-07-02 DIAGNOSIS — J449 Chronic obstructive pulmonary disease, unspecified: Secondary | ICD-10-CM

## 2022-07-02 DIAGNOSIS — N184 Chronic kidney disease, stage 4 (severe): Secondary | ICD-10-CM

## 2022-07-02 DIAGNOSIS — I43 Cardiomyopathy in diseases classified elsewhere: Secondary | ICD-10-CM

## 2022-07-02 DIAGNOSIS — I119 Hypertensive heart disease without heart failure: Secondary | ICD-10-CM

## 2022-07-02 DIAGNOSIS — I517 Cardiomegaly: Secondary | ICD-10-CM

## 2022-07-02 DIAGNOSIS — I10 Essential (primary) hypertension: Secondary | ICD-10-CM

## 2022-07-02 DIAGNOSIS — G4733 Obstructive sleep apnea (adult) (pediatric): Secondary | ICD-10-CM

## 2022-07-02 DIAGNOSIS — I5032 Chronic diastolic (congestive) heart failure: Secondary | ICD-10-CM

## 2022-07-02 DIAGNOSIS — E785 Hyperlipidemia, unspecified: Secondary | ICD-10-CM

## 2022-07-02 MED ORDER — NIFEDIPINE 90 MG PO TB24 *I*
90.0000 mg | ORAL_TABLET | Freq: Every day | ORAL | 0 refills | Status: AC
Start: 2022-07-02 — End: ?

## 2022-07-02 MED ORDER — DAPAGLIFLOZIN PROPANEDIOL 10 MG PO TABS *I*
10.0000 mg | ORAL_TABLET | Freq: Every morning | ORAL | 0 refills | Status: AC
Start: 2022-07-02 — End: ?

## 2022-07-02 NOTE — Progress Notes (Signed)
Heart Failure and Transplant Service - Follow Up TeleVisit Notation;         HPI    CHIEF COMPLAINT:    Chief Complaint   Patient presents with    dyspnea with exertion    right lower leg swelling       SUBJECTIVE:  [name] is a 46 y.o. male, with hx of HTN, HLD, former tobacco abuse, daily marijuana use, HFpEF, CKD st IV, chronic anemia, multiple sclerosis, COPD, OSA using CPAP, hx of MI in 2018 with CVA, pericardial effusion who was seen on 10/13 during which visit he was noted to have uncontrolled HTN.   He was admitted in 09/2021 with uncontrolled HTN and during that time was found to have severe LVH, reduced strain with pattern consistent with amyloidosis, small to mod pericardial effusion. Based on his cardiologist note his BP is usually normal when he takes medications however he has not been consistent in taking the medications because of a change in his insurance. He is on amlodipine, carvedilol, hydralazine, torsemide, imdur.   06/01/2022:  Comes in the clinic accompanied by his wife. Reports he is doing well and is here because he was told he might have amyloidosis.   Reports hx of HTN since age 56. At that time he states he was in a hospital and he underwent an angiogram. He was told he has a blood clott and that between stent and medications it was decided he needed medications.   Reports recently has not been taking the medications (for about a month) and his BP is very elevated. He reports his insurance finally approved his meds and on his way home he will pick up his medications.   Reports DOE. SOB with 1 flight of stairs, carrying heavy objects. Denies chest pain.  Denies dizziness, lightheadedness. Denies headaches. Denies palpitations.   Has RLE weakness and mild cognitive disorder that he attributes to multiple sclerosis. He reports he sees a neurologist and was told he didn't need any treatment for now.     07/02/2022:  Patient has a televisit scheduled today.   He joined the  visit at 9:20.  Reports doing better. When he last saw me he did not go to the ER but instead saw his cardiologist Dr Mosetta Putt a week ago. He was found to have very elevated BP and Right LE swelling that was significant. He underwent a doppler of RLE that showed no DVT. He was then sent to the ED for hypertensive emergency.   He stayed 1.5 days in the hospital. He was started on nifedipine 60mg  and coreg increased to 31.5. Reports when he left the hospital his BP was 160/73. When he went home he checked BP again and SBP was 220 but diastolic BP was around 90. Over the next following days his BP has been mostly 170/60. He reports he was diuresed in the hospital with torsemide 40mg  and was discharged on torsemide 20. He left the hospital 215lb. Has not checked weight at home.   Reports his SOB is similar. Has 17 stairs at home and has to stop in the middle because he has SOB. He reports no orthopnea or PND.   Has been taking medications regularly.     Patient Active Problem List    Diagnosis Date Noted    Hypertensive urgency 06/01/2022    Chronic diastolic heart failure 06/01/2022    Hypertensive cardiomyopathy 06/01/2022    Essential hypertension 06/01/2022    Concentric left ventricular hypertrophy  06/01/2022    Hyperlipidemia, unspecified hyperlipidemia type 06/01/2022    Chronic obstructive pulmonary disease, unspecified COPD type 06/01/2022    OSA (obstructive sleep apnea) 06/01/2022       PAST MEDICAL HISTORY:  Past Medical History:   Diagnosis Date    Chronic kidney disease     COPD (chronic obstructive pulmonary disease)     Hypertension     Multiple sclerosis     Myocardial infarction     Sleep apnea        PAST SURGICAL HISTORY:  No past surgical history on file.    HOME MEDICATIONS:  Current Outpatient Medications   Medication Sig    docusate sodium (COLACE) 100 mg capsule Take 1 capsule (100 mg total) by mouth 2 times daily    TRELEGY ELLIPTA 100-62.5-25 MCG/ACT diskus inhaler Inhale 1 puff into the lungs  daily    isosorbide dinitrate (ISORDIL) 20 MG tablet Take 1 tablet (20 mg total) by mouth 3 times daily    famotidine 20 mg tablet Take 1 tablet (20 mg total) by mouth daily    albuterol HFA (PROVENTIL, VENTOLIN, PROAIR HFA) 108 (90 Base) MCG/ACT inhaler Inhale 2 puffs into the lungs every 2 hours as needed for Wheezing  Shake well before each use.    aspirin 81 mg EC tablet Take 1 tablet (81 mg total) by mouth daily    atorvastatin (LIPITOR) 40 mg tablet Take 1 tablet (40 mg total) by mouth daily    carvedilol (COREG) 25 mg tablet Take 31.5 mg by mouth 2 times daily (with meals)    fluticasone (FLONASE) 50 MCG/ACT nasal spray Spray 2 sprays into nostril daily    hydrALAZINE (APRESOLINE) 50 mg tablet Take 1 tablet (50 mg total) by mouth every 6 hours    torsemide (DEMADEX) 20 mg tablet Take 1 tablet (20 mg total) by mouth every morning    NIFEdipine (ADALAT CC) 90 MG 24 hr tablet Take 1 tablet (90 mg total) by mouth daily    dapagliflozin (FARXIGA) 10 mg tablet Take 1 tablet (10 mg total) by mouth every morning    DULoxetine (CYMBALTA) 20 mg DR capsule Take 1 capsule (20 mg total) by mouth daily (Patient not taking: Reported on 07/02/2022)       ALLERGIES:  Allergies   Allergen Reactions    Bee Venom Other (See Comments)    Food Other (See Comments)    Penicillins Other (See Comments)     allergy       ROS:  A complete 10 point ROS was performed and is negative except for sharp pain in the lower back and HPI    OBJECTIVE:    VITAL SIGNS:  There were no vitals filed for this visit. There is no height or weight on file to calculate BMI. There is no height or weight on file to calculate BSA.  Wt Readings from Last 4 Encounters:   06/01/22 99.3 kg (219 lb)       PHYSICAL EXAM:  General: Alert and Oriented in NAD.  HEENT: MMM, no oropharyngeal lesions  Cardiac: reports regular rrhythm  Abdomen:reports is soft, non-tender.  Extremities: pink, warm and dry with RLE trace edema      LABORATORY TESTING:    Lab Results    Component Value Date/Time    WBC 4.08 04/18/2022 08:15 AM    HGB 11.3 04/18/2022 08:15 AM    HCT 34.60 04/18/2022 08:15 AM    PLT 231 04/18/2022 08:15 AM  Lab Results   Component Value Date/Time    NA 143 04/18/2022 08:15 AM    K 3.8 04/18/2022 08:15 AM    CL 108 04/18/2022 08:15 AM    CO2 29 04/18/2022 08:15 AM    UN 34 04/18/2022 08:15 AM    CREAT 3.40 04/18/2022 08:15 AM    GFRC 21.7 04/18/2022 08:15 AM    GLU 93 04/18/2022 08:15 AM    CA 8.9 04/18/2022 08:15 AM    TP 7.1 04/18/2022 08:15 AM    ALB 3.8 04/18/2022 08:15 AM    TB 0.7 04/18/2022 08:15 AM    HTBIL 0.7 04/18/2022 08:15 AM    AST 19 04/18/2022 08:15 AM    ALT 27 04/18/2022 08:15 AM    ALK 94 04/18/2022 08:15 AM       Labs 04/18/2022  Ferritin 33, Iron 55, TIBC 280, %Sat 20  NT pro BNP 249  TSH 1.27  Free kappa light chains urine 15; Free lambda light chains urine 6.17  IFE pattern appears unremarkable. Evidence of monoclonal protein is not apparent.   Urine IFE Normal pattern. No monoclonal proteins detected.   LDL 75     EKG 06/01/2022: Sinus rrhythm, LVH  External tests     TTE 04/19/2022:  Small to mod sized predominantly ant circumferential pericardial effusion. Compared to before there is a decrease in pericardial effusion size.      TTE 10/03/2021:  LV normal size. LV systolic function is mildly decreased. Mod concentric LVH. Mild global HK. LVEF 50%. RV normal size and function. Normal RV wall thickness. LA mod dilated. RA normal size. Along the free wall of the RV there is small pericardial effusion. No evidence of tamponade.      Brain MRI Periventricular white matter disease with dominant lesion in the right frontal lobe consistent with multiple sclerosis.       ASSESSMENT:  [name] is a 46 y.o. male   with hx of HTN, HLD, former tobacco abuse, daily marijuana use, HFpEF, CKD st IV, chronic anemia, multiple sclerosis, COPD, OSA using CPAP, hx of MI in 2018 with CVA, pericardial effusion who is scheduled today for a televisit  for BP management. Was recently hospitalized with hypertensive emergency and RLE>LLE edema.   He was started on nifedipine and coreg dose increased to 31.5 BID.   His BP has remained high.   Explained to the patient our target BP is SBP<130 and DBP<80  Today I increased his nifedipine to 90mg  daily.  Will start farxiga 10mg  daily  Recommended patient to monitor BP daily. Call us if persistently elevated so that we can continue to adjust medications.   Recommended the patient to monitor weight daily. If weight goes up more than 3lb/day or 5lb/week call us. We will advise changes in his medications.   The patient is moving to [place] in [place]. Will need to see him before he moves.   We will schedule an apt with Reather Littler, NP  NYHA Functional Class = III  AHA/ACC Cardiomyopathy Stage = C    Heart Failure Treatment:  ACE Inhibitor/ARB: Contraindicated CKD st IV  Beta blocker: coreg 31.5 BID   Aldosterone antagonist: Contraindicated Cr 3.5  SGLT2 Inhibitor: will start prescription sent today  ICD: No, not indicated d/t EF  Activity Recommendation: Walk at a mild intensity, for 20 minutes, 5 days a week  Patient on Anticoagulation: No    ADVANCE CARE PLANNING:  Health Care Proxy:  yes  Health Care Proxy Name/Relationship:  Valente Koker (Wife)        PLAN:  Uncontrolled hypertension  Increase nifedipine to 90mg  daily  Cont coreg 31.5 BID  Continue torsemide  Continue hydralazine 50 QID     2. Hypertensive cardiomyopathy/LVH/ Chronic diastolic HF  BP management  Please obtain cardiac MRI  Start farxiga 10mg  daily     3. CKD stage 4     4. OSA  Continue CPAP use     5. HLD  Last LDL 70  Resume atorvastatin     6. COPD  Continue inhalers        NEW MEDICATIONS ORDERED THIS ENCOUNTER:  New Prescriptions    DAPAGLIFLOZIN (FARXIGA) 10 MG TABLET    Take 1 tablet (10 mg total) by mouth every morning    NIFEDIPINE (ADALAT CC) 90 MG 24 HR TABLET    Take 1 tablet (90 mg total) by mouth daily         We  appreciate the opportunity to participate in the care of [name].  Please do not hesitate to contact us for any questions or concerns (Main office number [phone]).    Author: Katrine Coho, MD  Note created: 07/02/2022  at: 10:47 AM      I personally spent 55 minutes on the calendar day of the encounter, including pre and post visit work.

## 2022-07-02 NOTE — Patient Instructions (Addendum)
Increase nifedipine to 90mg  daily.  Start farxiga 10mg  daily  Monitor BP daily. Call us if persistently elevated so that we can continue to adjust medications.   Monitor weight daily. If weight goes up more than 3lb/day or 5lb/week call us. We will advise changes in his medications. 55

## 2022-08-16 ENCOUNTER — Observation Stay (HOSPITAL_COMMUNITY)
Admission: EM | Admit: 2022-08-16 | Discharge: 2022-08-18 | Disposition: A | Discharge status: Home / Self Care | Payer: Medicaid Other | Attending: Family Medicine | Admitting: Family Medicine

## 2022-08-16 ENCOUNTER — Encounter (HOSPITAL_COMMUNITY): Payer: Self-pay | Admitting: Nephrology

## 2022-08-16 ENCOUNTER — Other Ambulatory Visit: Payer: Self-pay

## 2022-08-16 ENCOUNTER — Emergency Department (HOSPITAL_COMMUNITY): Payer: Medicaid Other

## 2022-08-16 DIAGNOSIS — N184 Chronic kidney disease, stage 4 (severe): Secondary | ICD-10-CM | POA: Insufficient documentation

## 2022-08-16 DIAGNOSIS — N179 Acute kidney failure, unspecified: Secondary | ICD-10-CM | POA: Diagnosis not present

## 2022-08-16 DIAGNOSIS — J45909 Unspecified asthma, uncomplicated: Secondary | ICD-10-CM | POA: Insufficient documentation

## 2022-08-16 DIAGNOSIS — Z87891 Personal history of nicotine dependence: Secondary | ICD-10-CM

## 2022-08-16 DIAGNOSIS — I25118 Atherosclerotic heart disease of native coronary artery with other forms of angina pectoris: Secondary | ICD-10-CM

## 2022-08-16 DIAGNOSIS — E1122 Type 2 diabetes mellitus with diabetic chronic kidney disease: Secondary | ICD-10-CM | POA: Insufficient documentation

## 2022-08-16 DIAGNOSIS — Z7982 Long term (current) use of aspirin: Secondary | ICD-10-CM | POA: Diagnosis not present

## 2022-08-16 DIAGNOSIS — N186 End stage renal disease: Secondary | ICD-10-CM | POA: Diagnosis present

## 2022-08-16 DIAGNOSIS — K2981 Duodenitis with bleeding: Secondary | ICD-10-CM | POA: Diagnosis not present

## 2022-08-16 DIAGNOSIS — E278 Other specified disorders of adrenal gland: Secondary | ICD-10-CM | POA: Diagnosis present

## 2022-08-16 DIAGNOSIS — N189 Chronic kidney disease, unspecified: Secondary | ICD-10-CM

## 2022-08-16 DIAGNOSIS — I130 Hypertensive heart and chronic kidney disease with heart failure and stage 1 through stage 4 chronic kidney disease, or unspecified chronic kidney disease: Secondary | ICD-10-CM | POA: Insufficient documentation

## 2022-08-16 DIAGNOSIS — Z8673 Personal history of transient ischemic attack (TIA), and cerebral infarction without residual deficits: Secondary | ICD-10-CM | POA: Insufficient documentation

## 2022-08-16 DIAGNOSIS — Z79899 Other long term (current) drug therapy: Secondary | ICD-10-CM | POA: Insufficient documentation

## 2022-08-16 DIAGNOSIS — R9389 Abnormal findings on diagnostic imaging of other specified body structures: Secondary | ICD-10-CM

## 2022-08-16 DIAGNOSIS — E66811 Obesity, class 1: Secondary | ICD-10-CM

## 2022-08-16 DIAGNOSIS — N185 Chronic kidney disease, stage 5: Secondary | ICD-10-CM | POA: Diagnosis present

## 2022-08-16 DIAGNOSIS — K921 Melena: Secondary | ICD-10-CM | POA: Diagnosis present

## 2022-08-16 DIAGNOSIS — I159 Secondary hypertension, unspecified: Secondary | ICD-10-CM

## 2022-08-16 DIAGNOSIS — D120 Benign neoplasm of cecum: Secondary | ICD-10-CM | POA: Diagnosis not present

## 2022-08-16 DIAGNOSIS — D509 Iron deficiency anemia, unspecified: Secondary | ICD-10-CM | POA: Insufficient documentation

## 2022-08-16 DIAGNOSIS — I5032 Chronic diastolic (congestive) heart failure: Secondary | ICD-10-CM | POA: Diagnosis not present

## 2022-08-16 DIAGNOSIS — E876 Hypokalemia: Secondary | ICD-10-CM | POA: Diagnosis not present

## 2022-08-16 DIAGNOSIS — E6609 Other obesity due to excess calories: Secondary | ICD-10-CM

## 2022-08-16 DIAGNOSIS — I2510 Atherosclerotic heart disease of native coronary artery without angina pectoris: Secondary | ICD-10-CM | POA: Diagnosis not present

## 2022-08-16 DIAGNOSIS — I3139 Other pericardial effusion (noninflammatory): Secondary | ICD-10-CM | POA: Diagnosis not present

## 2022-08-16 DIAGNOSIS — K648 Other hemorrhoids: Secondary | ICD-10-CM | POA: Insufficient documentation

## 2022-08-16 DIAGNOSIS — I10 Essential (primary) hypertension: Secondary | ICD-10-CM | POA: Diagnosis present

## 2022-08-16 DIAGNOSIS — D649 Anemia, unspecified: Secondary | ICD-10-CM | POA: Diagnosis present

## 2022-08-16 DIAGNOSIS — Z5900 Homelessness unspecified: Secondary | ICD-10-CM

## 2022-08-16 DIAGNOSIS — K922 Gastrointestinal hemorrhage, unspecified: Principal | ICD-10-CM | POA: Diagnosis present

## 2022-08-16 HISTORY — DX: Sleep apnea, unspecified: G47.30

## 2022-08-16 LAB — CBC WITH DIFFERENTIAL/PLATELET
Abs Immature Granulocytes: 0 10*3/uL (ref 0.00–0.07)
Basophils Absolute: 0 10*3/uL (ref 0.0–0.1)
Basophils Relative: 0 %
Eosinophils Absolute: 0.1 10*3/uL (ref 0.0–0.5)
Eosinophils Relative: 4 %
HCT: 26.4 % — ABNORMAL LOW (ref 39.0–52.0)
Hemoglobin: 8.3 g/dL — ABNORMAL LOW (ref 13.0–17.0)
Immature Granulocytes: 0 %
Lymphocytes Relative: 28 %
Lymphs Abs: 1 10*3/uL (ref 0.7–4.0)
MCH: 22.5 pg — ABNORMAL LOW (ref 26.0–34.0)
MCHC: 31.4 g/dL (ref 30.0–36.0)
MCV: 71.5 fL — ABNORMAL LOW (ref 80.0–100.0)
Monocytes Absolute: 0.4 10*3/uL (ref 0.1–1.0)
Monocytes Relative: 12 %
Neutro Abs: 2 10*3/uL (ref 1.7–7.7)
Neutrophils Relative %: 56 %
Platelets: 232 10*3/uL (ref 150–400)
RBC: 3.69 MIL/uL — ABNORMAL LOW (ref 4.22–5.81)
RDW: 16.5 % — ABNORMAL HIGH (ref 11.5–15.5)
WBC: 3.6 10*3/uL — ABNORMAL LOW (ref 4.0–10.5)
nRBC: 0 % (ref 0.0–0.2)

## 2022-08-16 LAB — LIPASE, BLOOD: Lipase: 39 U/L (ref 11–51)

## 2022-08-16 LAB — COMPREHENSIVE METABOLIC PANEL
ALT: 36 U/L (ref 0–44)
AST: 30 U/L (ref 15–41)
Albumin: 3 g/dL — ABNORMAL LOW (ref 3.5–5.0)
Alkaline Phosphatase: 85 U/L (ref 38–126)
Anion gap: 7 (ref 5–15)
BUN: 33 mg/dL — ABNORMAL HIGH (ref 6–20)
CO2: 23 mmol/L (ref 22–32)
Calcium: 8.5 mg/dL — ABNORMAL LOW (ref 8.9–10.3)
Chloride: 110 mmol/L (ref 98–111)
Creatinine, Ser: 4.46 mg/dL — ABNORMAL HIGH (ref 0.61–1.24)
GFR, Estimated: 16 mL/min — ABNORMAL LOW (ref >60)
Glucose, Bld: 104 mg/dL — ABNORMAL HIGH (ref 70–99)
Potassium: 3.5 mmol/L (ref 3.5–5.1)
Sodium: 140 mmol/L (ref 135–145)
Total Bilirubin: 0.9 mg/dL (ref 0.3–1.2)
Total Protein: 6.5 g/dL (ref 6.5–8.1)

## 2022-08-16 LAB — TYPE AND SCREEN
ABO/RH(D): O NEG
Antibody Screen: NEGATIVE

## 2022-08-16 LAB — RAPID URINE DRUG SCREEN, HOSP PERFORMED
Amphetamines: NOT DETECTED
Barbiturates: NOT DETECTED
Benzodiazepines: NOT DETECTED
Cocaine: NOT DETECTED
Opiates: NOT DETECTED
Tetrahydrocannabinol: POSITIVE — AB

## 2022-08-16 LAB — HIV ANTIBODY (ROUTINE TESTING W REFLEX): HIV Screen 4th Generation wRfx: NONREACTIVE

## 2022-08-16 LAB — FOLATE: Folate: 27.6 ng/mL (ref >5.9)

## 2022-08-16 LAB — POC OCCULT BLOOD, ED: Fecal Occult Bld: POSITIVE — AB

## 2022-08-16 LAB — PROTIME-INR
INR: 1.1 (ref 0.8–1.2)
Prothrombin Time: 13.6 seconds (ref 11.4–15.2)

## 2022-08-16 LAB — ABO/RH: ABO/RH(D): O NEG

## 2022-08-16 MED ORDER — ACETAMINOPHEN 650 MG RE SUPP: 650.0000 mg | Freq: Four times a day (QID) | RECTAL | Status: DC | PRN

## 2022-08-16 MED ORDER — NIFEDIPINE ER OSMOTIC RELEASE 60 MG PO TB24
60.0000 mg | ORAL_TABLET | Freq: Once | ORAL | Status: AC
  Administered 2022-08-16: 60 mg via ORAL
  Filled 2022-08-16: qty 1

## 2022-08-16 MED ORDER — LABETALOL HCL 5 MG/ML IV SOLN
5.0000 mg | INTRAVENOUS | Status: DC | PRN
  Administered 2022-08-18: 10 mg via INTRAVENOUS
  Filled 2022-08-16: qty 4

## 2022-08-16 MED ORDER — ACETAMINOPHEN 325 MG PO TABS: 650.0000 mg | ORAL_TABLET | Freq: Four times a day (QID) | ORAL | Status: DC | PRN

## 2022-08-16 MED ORDER — CARVEDILOL 12.5 MG PO TABS
25.0000 mg | ORAL_TABLET | Freq: Two times a day (BID) | ORAL | Status: DC
  Administered 2022-08-17 – 2022-08-18 (×3): 25 mg via ORAL
  Filled 2022-08-16 (×3): qty 2

## 2022-08-16 MED ORDER — ONDANSETRON HCL 4 MG/2ML IJ SOLN: 4.0000 mg | Freq: Four times a day (QID) | INTRAMUSCULAR | Status: DC | PRN

## 2022-08-16 MED ORDER — AMLODIPINE BESYLATE 10 MG PO TABS
10.0000 mg | ORAL_TABLET | Freq: Every day | ORAL | Status: DC
  Administered 2022-08-17 – 2022-08-18 (×2): 10 mg via ORAL
  Filled 2022-08-16 (×2): qty 1

## 2022-08-16 MED ORDER — HYDRALAZINE HCL 10 MG PO TABS
10.0000 mg | ORAL_TABLET | Freq: Three times a day (TID) | ORAL | Status: DC
  Administered 2022-08-16 – 2022-08-18 (×5): 10 mg via ORAL
  Filled 2022-08-16 (×5): qty 1

## 2022-08-16 MED ORDER — ISOSORBIDE DINITRATE 10 MG PO TABS
20.0000 mg | ORAL_TABLET | Freq: Once | ORAL | Status: AC
  Administered 2022-08-16: 20 mg via ORAL
  Filled 2022-08-16: qty 2

## 2022-08-16 MED ORDER — HYDRALAZINE HCL 25 MG PO TABS
50.0000 mg | ORAL_TABLET | Freq: Once | ORAL | Status: AC
  Administered 2022-08-16: 50 mg via ORAL
  Filled 2022-08-16: qty 2

## 2022-08-16 MED ORDER — ONDANSETRON HCL 4 MG PO TABS: 4.0000 mg | ORAL_TABLET | Freq: Four times a day (QID) | ORAL | Status: DC | PRN

## 2022-08-16 NOTE — Assessment & Plan Note (Addendum)
No symptoms of adrenal secretory activity - Referred to Urology, Dr. Louis Meckel - Obtain DHEAs, PRA, salivary cortisol, plasma metanephrines, and will collect 24 hr urine metaneph and catecholamines

## 2022-08-16 NOTE — Assessment & Plan Note (Addendum)
Cr stable - Nephrology referral made

## 2022-08-16 NOTE — Consult Note (Signed)
Referring Provider: Dr. Blanchie Dessert Primary Care Physician:  Pcp, No Primary Gastroenterologist:  Althia Forts   Reason for Consultation: Hematochezia,   HPI: George Day is a 46 y.o. male with a past medical history of anxiety, hypertension, coronary artery disease s/p MI in 2020 and 02/6545, chronic diastolic CHF, pretense of cardiomyopathy, CVA, CKD stage IIIb, chronic anemia, sleep apnea, possible MS and a right adrenal mass.  He presented to the ED today due to having central abdominal burning pain and rectal bleeding.  He described feeling as if his central abdomen was on fire.  Labs in the ED showed WBC count of 3.6.  Hemoglobin 8.3 (Hg 11 on 02/24/2021).  Hematocrit 26.4.  MCV 71.5.  Platelets 232.  BUN 33.  Creatinine 4.46. (Cr 3.58 on 02/25/2021).  Total bili 0.9.  Alk phos 85.  AST 30.  ALT 36.  Lipase 39.  INR 1.1.  FOBT positive.  Rectal exam completed by the the ED PA-C noted bright bloody mucus with clay colored stool, no hemorrhoids. CTAP without contrast showed a normal stomach and bowel without evidence of diverticulosis/diverticulitis or colitis.  An enlarging lobulated right adrenal mass containing peripheral calcification and 8.2 x 4.6 x 4.8 cm (previously 7.5 x 3.7 x 4.2 cm on 02/19/2021) concerning for pheochromocytoma in the setting of hypertension.  Mild diffuse urinary bladder wall thickening was noted, possible cystitis.  A moderate volume pericardial effusion was also noted.  He awakened at 3 AM today with central abdominal burning pain.  He went to the bathroom and passed watery diarrhea with bits of stool and a large amount of bright red blood x 3 episodes and one similar episode while he was in the ED waiting room.  He previously passed a normal formed brown bowel movement most days.  No prior rectal bleeding.  No melena.  He takes ASA 81 mg once daily.  He is not on any blood thinners.  He reported undergoing a screening colonoscopy by a GI in [place] 3 to 4  months ago which he reported was normal.  No known family history of colorectal cancer.  He has heartburn twice monthly or less.  No dysphagia.  He stopped smoking cigarettes 09/2021.  Denies alcohol use. History of water use.  He has a history of coronary artery disease.  He reported having a heart attack in 2010 and 09/2021 when living in [place].  He denies ever having PTCA or DES placement.  He denies having any chest pain, palpitations or shortness of breath.  He and his family moved to [place] 3 weeks ago, they are currently homeless.  Echo 08/09/2020: 1. Blood pressure is significantly elevated. Left ventricular ejection fraction, by estimation, is 50 to 55%. The left ventricle has low normal function. The left ventricle has no regional wall motion abnormalities. There is moderate left ventricular hypertrophy. Left ventricular diastolic parameters are consistent with Grade II diastolic dysfunction (pseudonormalization). Elevated left ventricular end-diastolic pressure. 2. Right ventricular systolic function is normal. The right ventricular size is normal. 3. Left atrial size was mildly dilated. 4. A trivial to small pericardial effusion is present. The pericardial effusion is circumferential. 5. The mitral valve is abnormal. Trivial mitral valve regurgitation. 6. The aortic valve is tricuspid. Aortic valve regurgitation is not visualized. 7. The inferior vena cava is normal in size with greater than 50% respiratory variability, suggesting right atrial pressure of 3 mmHg.   Past Medical History:  Diagnosis Date   Anemia 1998  Anginal pain ([place])    Anxiety    Asthma    Coronary artery disease    Diabetes mellitus without complication ([place]) 7619   Hypertension 2019   MI (myocardial infarction) ([place]) 2010   Stroke [facility]) 2010    Past Surgical History:  Procedure Laterality Date   CARDIAC CATHETERIZATION  2010    Prior to Admission medications   Medication Sig Start Date  End Date Taking? Authorizing Provider  amLODipine (NORVASC) 10 MG tablet Take 1 tablet (10 mg total) by mouth daily. 03/07/21 03/07/22  Charlott Rakes, MD  aspirin EC 81 MG tablet Take 1 tablet (81 mg total) by mouth daily. Swallow whole. 08/29/20   Ladell Pier, MD  atorvastatin (LIPITOR) 40 MG tablet Take 1 tablet (40 mg total) by mouth daily. 03/07/21   Charlott Rakes, MD  carvedilol (COREG) 25 MG tablet Take 1 tablet (25 mg total) by mouth 2 (two) times daily with a meal. 03/07/21   Charlott Rakes, MD  fluticasone-salmeterol (ADVAIR) 250-50 MCG/ACT AEPB Inhale 1 puff into the lungs 2 (two) times daily. 03/07/21 03/07/22  Charlott Rakes, MD  hydrALAZINE (APRESOLINE) 10 MG tablet Take 1 tablet (10 mg total) by mouth every 8 (eight) hours. 03/07/21   Charlott Rakes, MD  nitroGLYCERIN (NITROSTAT) 0.6 MG SL tablet Place 0.6 mg under the tongue every 5 (five) minutes as needed for chest pain.    [provider]  Vitamin D, Ergocalciferol, (DRISDOL) 1.25 MG (50000 UNIT) CAPS capsule Take 1 capsule (50,000 Units total) by mouth every 7 (seven) days. 03/03/21   Domenic Polite, MD    No current facility-administered medications for this encounter.   Current Outpatient Medications  Medication Sig Dispense Refill   amLODipine (NORVASC) 10 MG tablet Take 1 tablet (10 mg total) by mouth daily. 30 tablet 1   aspirin EC 81 MG tablet Take 1 tablet (81 mg total) by mouth daily. Swallow whole. 100 tablet 1   atorvastatin (LIPITOR) 40 MG tablet Take 1 tablet (40 mg total) by mouth daily. 30 tablet 1   carvedilol (COREG) 25 MG tablet Take 1 tablet (25 mg total) by mouth 2 (two) times daily with a meal. 60 tablet 1   fluticasone-salmeterol (ADVAIR) 250-50 MCG/ACT AEPB Inhale 1 puff into the lungs 2 (two) times daily. 60 each 1   hydrALAZINE (APRESOLINE) 10 MG tablet Take 1 tablet (10 mg total) by mouth every 8 (eight) hours. 90 tablet 1   nitroGLYCERIN (NITROSTAT) 0.6 MG SL tablet Place 0.6 mg under  the tongue every 5 (five) minutes as needed for chest pain.     Vitamin D, Ergocalciferol, (DRISDOL) 1.25 MG (50000 UNIT) CAPS capsule Take 1 capsule (50,000 Units total) by mouth every 7 (seven) days. 5 capsule 0    Allergies as of 08/16/2022 - Reviewed 08/16/2022  Allergen Reaction Noted   Penicillins Anaphylaxis 01/05/2020   Tomato Swelling 08/08/2020    Family History  Problem Relation Age of Onset   Diabetes Mother    Hyperlipidemia Mother    Diabetes Sister    Hyperlipidemia Sister    Hearing loss Sister    Diabetes Brother    Hyperlipidemia Brother     Social History   Socioeconomic History   Marital status: Single    Spouse name: Not on file   Number of children: 3   Years of education: Not on file   Highest education level: Not on file  Occupational History   Not on file  Tobacco Use  Smoking status: Some Days    Packs/day: 0.50    Years: 31.00    Total pack years: 15.50    Types: Cigars, E-cigarettes, Cigarettes   Smokeless tobacco: Never  Vaping Use   Vaping Use: Never used  Substance and Sexual Activity   Alcohol use: Yes    Comment: occasioanlly    Drug use: Never   Sexual activity: Yes    Birth control/protection: None  Other Topics Concern   Not on file  Social History Narrative   Not on file   Social Determinants of Health   Financial Resource Strain: Not on file  Food Insecurity: Not on file  Transportation Needs: Not on file  Physical Activity: Not on file  Stress: Not on file  Social Connections: Not on file  Intimate Partner Violence: Not on file   Review of Systems: Gen: Denies fever, sweats or chills. No weight loss.  CV: Denies chest pain, palpitations or edema. Resp: Denies cough, shortness of breath of hemoptysis.  GI: See HPI..   GU : Denies urinary burning, blood in urine, increased urinary frequency or incontinence. MS: Denies joint pain, muscles aches or weakness. Derm: Denies rash, itchiness, skin lesions or unhealing  ulcers. Psych: Denies depression, anxiety, memory loss or confusion. Heme: Denies easy bruising, bleeding. Neuro:  Denies headaches, dizziness or paresthesias. Endo:  Denies any problems with DM, thyroid or adrenal function.  Physical Exam: Vital signs in last 24 hours: Temp:  [98.1 F (36.7 C)-98.2 F (36.8 C)] 98.2 F (36.8 C) (12/28 1300) Pulse Rate:  [63-70] 63 (12/28 1545) Resp:  [17-25] 21 (12/28 1545) BP: (190-232)/(109-127) 216/126 (12/28 1545) SpO2:  [98 %-100 %] 100 % (12/28 1545)   General: 46 year old male in no acute distress. Head:  Normocephalic and atraumatic. Eyes:  No scleral icterus. Conjunctiva pink. Ears:  Normal auditory acuity. Nose:  No deformity, discharge or lesions. Mouth:  Dentition intact. No ulcers or lesions.  Neck:  Supple. No lymphadenopathy or thyromegaly.  Lungs: Breath sounds clear throughout. No wheezes, rhonchi or crackles.  Heart: Regular rate and rhythm, no murmurs. Abdomen: Soft, mild distention.  Nontender.  Positive bowel sounds to all 4 quadrants. Rectal: Pleated by ED PA-C, see HPI. Musculoskeletal:  Symmetrical without gross deformities.  Pulses:  Normal pulses noted. Extremities:  Without clubbing or edema. Neurologic:  Alert and  oriented x 4. No focal deficits.  Skin:  Intact without significant lesions or rashes. Psych:  Alert and cooperative. Normal mood and affect.  Intake/Output from previous day: No intake/output data recorded. Intake/Output this shift: No intake/output data recorded.  Lab Results: Recent Labs    08/16/22 0622  WBC 3.6*  HGB 8.3*  HCT 26.4*  PLT 232   BMET Recent Labs    08/16/22 0622  NA 140  K 3.5  CL 110  CO2 23  GLUCOSE 104*  BUN 33*  CREATININE 4.46*  CALCIUM 8.5*   LFT Recent Labs    08/16/22 0622  PROT 6.5  ALBUMIN 3.0*  AST 30  ALT 36  ALKPHOS 85  BILITOT 0.9   PT/INR Recent Labs    08/16/22 0622  LABPROT 13.6  INR 1.1   Hepatitis Panel No results for  input(s): "HEPBSAG", "HCVAB", "HEPAIGM", "HEPBIGM" in the last 72 hours.    Studies/Results: No results found.  IMPRESSION/PLAN:  75) 46 year old male presented to the ED with abrupt onset central abdominal burning pain and hematochezia.  Admission hemoglobin 8.3 down (baseline hemoglobin 11). CTAP without contrast showed a  normal stomach and bowel without evidence of diverticulosis/diverticulitis or colitis.  Stool exam completed by the ED PA-C showed bright red bloody mucus with clay colored stool.  Patient underwent a colonoscopy in [place] 3 to 4 months ago which he reported was normal. -Clear liquid diet -Continue to monitor the patient for active GI bleeding -Monitor H&H closely -Transfuse for hemoglobin less than 8  -No plans for endoscopic evaluation at this time -Attempt to obtain colonoscopy procedure report from [place] -Await further recommendations per Dr. Lorenso Courier  2) Acute on chronic anemia, secondary to CKD and hematochezia   3) AKI on CKD. Cr 4.46.  4) Hypertensive crisis, history of noncompliance with antihypertensive medications -Management per the medical service  5) Enlarging right adrenal mass.  Patient was seen by endocrinologist 01/2021 to evaluate 7.4 cm adrenal mass.  At that time, a 24-hour urine collection for catecholamines, metanephrines and cortisol was ordered, results not found in care everywhere. Surgical resection was recommended but was not done.  Urine catecholamines 02/21/2021 collected during his hospitalization were normal.  CTAP today showed the right adrenal mass measuring 8.2 x 4.6 x 0.8 cm. -Recommend endocrinology consult and general surgery consult  6) Moderate pericardial effusion, suspect due to significant hypertension -Recommend ECHO, cardiology consult  7) History of CAD s/p MI in 2010 and 09/2021  8) possible urinary bladder cystitis per CT -Evaluation/management per the medical service    Noralyn Pick  08/16/2022,  5:25PM

## 2022-08-16 NOTE — ED Notes (Addendum)
Spoke with Dr. Melina Copa, Ct to be dc'd due to patient's bun and creat.

## 2022-08-16 NOTE — Assessment & Plan Note (Addendum)
Stable - Trend Hgb

## 2022-08-16 NOTE — H&P (Signed)
History and Physical    Patient: George Day TFT:[id number] DOB: [birth year] DOA: 08/16/2022 DOS: the patient was seen and examined on 08/16/2022 PCP: Pcp, No  Patient coming from: Homeless  Chief Complaint:  Chief Complaint  Patient presents with   Hematochezia   HPI: George Day is a 46 y.o. male with medical history significant of uncontrolled HTN, with complications including CKD 4, reported stroke and MI, HTN cardiomyopathy, R adrenal mass.  Pt presents to the ED with c/o central abd burning pain and rectal bleeding.  He awakened at 3 AM today with central abdominal burning pain.  He went to the bathroom and passed watery diarrhea with bits of stool and a large amount of bright red blood x 3 episodes and one similar episode while he was in the ED waiting room.  He previously passed a normal formed brown bowel movement most days.  No prior rectal bleeding.  No melena.  He takes ASA 81 mg once daily.  He is not on any blood thinners.  He reported undergoing a screening colonoscopy by a GI in [place] 3 to 4 months ago which he reported was normal.  No known family history of colorectal cancer.  He has heartburn twice monthly or less.  No dysphagia.  He stopped smoking cigarettes 09/2021.  Denies alcohol use.  He has a history of coronary artery disease. He reported having a heart attack in 2010 and 09/2021 when living in [place]. He denies ever having PTCA or DES placement. He denies having any chest pain, palpitations or shortness of breath.   Review of Systems: As mentioned in the history of present illness. All other systems reviewed and are negative. Past Medical History:  Diagnosis Date   Anemia 1998   Anginal pain ([place])    Anxiety    Asthma    Coronary artery disease    Diabetes mellitus without complication ([place]) 7062   Hypertension 2019   MI (myocardial infarction) ([place]) 2010   Stroke [facility]) 2010   Past Surgical History:  Procedure Laterality Date   CARDIAC  CATHETERIZATION  2010   Social History:  reports that he has quit smoking. His smoking use included cigars, e-cigarettes, and cigarettes. He has a 15.50 pack-year smoking history. He has never used smokeless tobacco. He reports that he does not currently use alcohol. He reports that he does not currently use drugs after having used the following drugs: Marijuana.  Allergies  Allergen Reactions   Penicillins Anaphylaxis    ALL   Tomato Swelling    Family History  Problem Relation Age of Onset   Diabetes Mother    Hyperlipidemia Mother    Diabetes Sister    Hyperlipidemia Sister    Hearing loss Sister    Diabetes Brother    Hyperlipidemia Brother     Prior to Admission medications   Medication Sig Start Date End Date Taking? Authorizing Provider  amLODipine (NORVASC) 10 MG tablet Take 1 tablet (10 mg total) by mouth daily. 03/07/21 03/07/22  Charlott Rakes, MD  aspirin EC 81 MG tablet Take 1 tablet (81 mg total) by mouth daily. Swallow whole. 08/29/20   Ladell Pier, MD  atorvastatin (LIPITOR) 40 MG tablet Take 1 tablet (40 mg total) by mouth daily. 03/07/21   Charlott Rakes, MD  carvedilol (COREG) 25 MG tablet Take 1 tablet (25 mg total) by mouth 2 (two) times daily with a meal. 03/07/21   Charlott Rakes, MD  fluticasone-salmeterol (ADVAIR) 250-50 MCG/ACT AEPB Inhale  1 puff into the lungs 2 (two) times daily. 03/07/21 03/07/22  Charlott Rakes, MD  hydrALAZINE (APRESOLINE) 10 MG tablet Take 1 tablet (10 mg total) by mouth every 8 (eight) hours. 03/07/21   Charlott Rakes, MD  nitroGLYCERIN (NITROSTAT) 0.6 MG SL tablet Place 0.6 mg under the tongue every 5 (five) minutes as needed for chest pain.    [provider]  Vitamin D, Ergocalciferol, (DRISDOL) 1.25 MG (50000 UNIT) CAPS capsule Take 1 capsule (50,000 Units total) by mouth every 7 (seven) days. 03/03/21   Domenic Polite, MD    Physical Exam: Vitals:   08/16/22 1726 08/16/22 1729 08/16/22 1836 08/16/22 1900  BP:    (!) 191/95 (!) 187/90  Pulse: 81  83 98  Resp: 19  (!) 24 (!) 31  Temp:  98.4 F (36.9 C)    TempSrc:  Oral    SpO2: 98%  98% 100%   Constitutional: NAD, calm, comfortable Eyes: PERRL, lids and conjunctivae normal ENMT: Mucous membranes are moist. Posterior pharynx clear of any exudate or lesions.Normal dentition.  Neck: normal, supple, no masses, no thyromegaly Respiratory: clear to auscultation bilaterally, no wheezing, no crackles. Normal respiratory effort. No accessory muscle use.  Cardiovascular: Regular rate and rhythm, no murmurs / rubs / gallops. No extremity edema. 2+ pedal pulses. No carotid bruits.  Abdomen: no tenderness, no masses palpated. No hepatosplenomegaly. Bowel sounds positive.  Musculoskeletal: no clubbing / cyanosis. No joint deformity upper and lower extremities. Good ROM, no contractures. Normal muscle tone.  Skin: no rashes, lesions, ulcers. No induration Neurologic: CN 2-12 grossly intact. Sensation intact, DTR normal. Strength 5/5 in all 4.  Psychiatric: Normal judgment and insight. Alert and oriented x 3. Normal mood.   Data Reviewed:       Latest Ref Rng & Units 08/16/2022    6:22 AM 02/24/2021    2:44 AM 02/22/2021    1:47 AM  CBC  WBC 4.0 - 10.5 K/uL 3.6  15.9  5.3   Hemoglobin 13.0 - 17.0 g/dL 8.3  11.0  11.1   Hematocrit 39.0 - 52.0 % 26.4  32.7  34.1   Platelets 150 - 400 K/uL 232  213  225       Latest Ref Rng & Units 08/16/2022    6:22 AM 02/25/2021   12:50 AM 02/24/2021    2:44 AM  CMP  Glucose 70 - 99 mg/dL 104  177  164   BUN 6 - 20 mg/dL 33  55  51   Creatinine 0.61 - 1.24 mg/dL 4.46  3.58  3.29   Sodium 135 - 145 mmol/L 140  132  132   Potassium 3.5 - 5.1 mmol/L 3.5  3.9  3.8   Chloride 98 - 111 mmol/L 110  104  103   CO2 22 - 32 mmol/L '23  22  22   '$ Calcium 8.9 - 10.3 mg/dL 8.5  8.7  8.9   Total Protein 6.5 - 8.1 g/dL 6.5     Total Bilirubin 0.3 - 1.2 mg/dL 0.9     Alkaline Phos 38 - 126 U/L 85     AST 15 - 41 U/L 30     ALT 0 -  44 U/L 36      CT AP: IMPRESSION: 1. Mild diffuse urinary bladder wall thickening. Correlate with urinalysis to exclude cystitis. 2. Enlarging lobulated right adrenal mass containing peripheral calcification. Mass now measures approximately 8.2 x 4.6 x 4.8 cm (previously 7.5 x 3.7 x 4.2 cm  on 02/19/2021). Findings may represent pheochromocytoma although cystic adrenal carcinoma cannot be excluded by imaging alone. Suggest correlation with laboratory values and surgical consultation as a lesion of this size warrants consideration for surgical removal. If patient is not a surgical candidate or if further imaging is indicated,would consider further evaluation with pre and postcontrast adrenal protocol CT or MRI 3. Mildly enlarged bilateral inguinal lymph nodes, nonspecific. 4. Moderate volume pericardial effusion.  Assessment and Plan: * GIB (gastrointestinal bleeding) See GI consult note Looks like they arnt planning endoscopy at the moment Apparently [place] No hemorrhoids on exam per EDP. Clear liquid diet  Anemia Some combination of ABLA from GIB, subacute blood loss anemia from GIB, and probably anemia of CKD. H/H Q6H Check anemia panel Type and screen performed Transfuse if needed Tele monitor  Accelerated hypertension Uncontrolled currently. Looks like in past its fairly well controlled when he takes home meds, but pt homeless, not consistent with taking home meds. Got cardene PO, hydralazine PO, and isordil in ED. Add labetalol PRN Check UDS to make sure no cocaine on board, none in 2022 and looks like pt just uses tobacco, occasional EtOH, and cannabis chronically daily from history. Resume prior meds of norvasc, coreg, and PO hydralazine tomorrow.  Right adrenal mass (HCC) Unclear if pheo or not, urine metanephrines neg during admit last year (plasma positive, but high false positive rate). Regardless, surgical  resection was recommended based on size alone in 2022 when he saw endocrinologist in July: "-I have explained to the patient that given the size of adrenal mass > 4 cm, the risk of cancer is higher and I have recommended surgical resection" Now enlarging. Consult general surgery in AM to see if they can resect this during admission.  Though anemia and GIB may make this a bit more difficult.  Im a bit worried about follow up in this patient who is homeless, uninsured. I sent a secure chat message to the general surgery group for non-emergent consult. Endocrine consult not available inpatient at [facility] as far as I am aware?  Homelessness SW consult  CKD (chronic kidney disease) stage 4, GFR 15-29 ml/min (HCC) CKD 4 borderline, todays labs slightly worse than baseline, likely just progression of CKD in setting of uncontrolled malignant hypertension. Repeat BMP in AM Needs nephrology follow up      Advance Care Planning:   Code Status: Full Code   Consults: GI, and general surgery  Family Communication: No family in room  Severity of Illness: The appropriate patient status for this patient is OBSERVATION. Observation status is judged to be reasonable and necessary in order to provide the required intensity of service to ensure the patient's safety. The patient's presenting symptoms, physical exam findings, and initial radiographic and laboratory data in the context of their medical condition is felt to place them at decreased risk for further clinical deterioration. Furthermore, it is anticipated that the patient will be medically stable for discharge from the hospital within 2 midnights of admission.   Author: Etta Quill., DO 08/16/2022 8:04 PM  For on call review [email or web address].

## 2022-08-16 NOTE — ED Provider Triage Note (Signed)
Emergency Medicine Provider Triage Evaluation Note  George Day , a 46 y.o. male  was evaluated in triage.  Pt complains of abdominal pain and rectal bleeding.  Not currently anticoagulated.  Has never had anything previously.  Feels like his central abdomen is "on fire."  No chronic NSAID use, EtOH use, history of varices.  States he has had multiple episodes of bright red blood in his stool which awoken him from sleep today however now he is now having straight blood from his rectum.  No history of hemorrhoids.  No rectal pain.  No known trauma.  Had colonoscopy few months ago without significant findings per patient.  Review of Systems  Positive: Abd pain, rectal bleeding Negative: fever  Physical Exam  There were no vitals taken for this visit. Gen:   Awake, no distress   Resp:  Normal effort  MSK:   Moves extremities without difficulty  Other:    Medical Decision Making  Medically screening exam initiated at 6:22 AM.  Appropriate orders placed.  Mamie Diiorio was informed that the remainder of the evaluation will be completed by another provider, this initial triage assessment does not replace that evaluation, and the importance of remaining in the ED until their evaluation is complete.  Abd pain, rectal bleeding   Sultan Pargas A, PA-C 08/16/22 [id number]

## 2022-08-16 NOTE — Assessment & Plan Note (Addendum)
-   Consult GI - Trend Hgb

## 2022-08-16 NOTE — ED Triage Notes (Signed)
Patient reports multiple bloody stools this morning with generalized abdominal pain " burning" . No emesis or fever .

## 2022-08-16 NOTE — Assessment & Plan Note (Addendum)
BP controlled on home meds - Continue Coreg, amlodipine, hydralazine

## 2022-08-17 DIAGNOSIS — K921 Melena: Secondary | ICD-10-CM | POA: Diagnosis not present

## 2022-08-17 DIAGNOSIS — I2510 Atherosclerotic heart disease of native coronary artery without angina pectoris: Secondary | ICD-10-CM | POA: Insufficient documentation

## 2022-08-17 DIAGNOSIS — I3139 Other pericardial effusion (noninflammatory): Secondary | ICD-10-CM | POA: Insufficient documentation

## 2022-08-17 LAB — RETICULOCYTES
Immature Retic Fract: 18.8 % — ABNORMAL HIGH (ref 2.3–15.9)
RBC.: 3.85 MIL/uL — ABNORMAL LOW (ref 4.22–5.81)
Retic Count, Absolute: 55.8 K/uL (ref 19.0–186.0)
Retic Ct Pct: 1.5 % (ref 0.4–3.1)

## 2022-08-17 LAB — URINALYSIS, ROUTINE W REFLEX MICROSCOPIC
Bacteria, UA: NONE SEEN
Bilirubin Urine: NEGATIVE
Glucose, UA: NEGATIVE mg/dL
Ketones, ur: NEGATIVE mg/dL
Leukocytes,Ua: NEGATIVE
Nitrite: NEGATIVE
Protein, ur: 100 mg/dL — AB
Specific Gravity, Urine: 1.008 (ref 1.005–1.030)
pH: 5 (ref 5.0–8.0)

## 2022-08-17 LAB — HEMOGLOBIN AND HEMATOCRIT, BLOOD
HCT: 26.5 % — ABNORMAL LOW (ref 39.0–52.0)
HCT: 26.4 % — ABNORMAL LOW (ref 39.0–52.0)
HCT: 28 % — ABNORMAL LOW (ref 39.0–52.0)
Hemoglobin: 8.6 g/dL — ABNORMAL LOW (ref 13.0–17.0)
Hemoglobin: 9 g/dL — ABNORMAL LOW (ref 13.0–17.0)
Hemoglobin: 9.2 g/dL — ABNORMAL LOW (ref 13.0–17.0)

## 2022-08-17 LAB — BASIC METABOLIC PANEL
Anion gap: 12 (ref 5–15)
BUN: 29 mg/dL — ABNORMAL HIGH (ref 6–20)
CO2: 23 mmol/L (ref 22–32)
Calcium: 8.4 mg/dL — ABNORMAL LOW (ref 8.9–10.3)
Chloride: 103 mmol/L (ref 98–111)
Creatinine, Ser: 4 mg/dL — ABNORMAL HIGH (ref 0.61–1.24)
GFR, Estimated: 18 mL/min — ABNORMAL LOW (ref >60)
Glucose, Bld: 127 mg/dL — ABNORMAL HIGH (ref 70–99)
Potassium: 2.7 mmol/L — CL (ref 3.5–5.1)
Sodium: 138 mmol/L (ref 135–145)

## 2022-08-17 LAB — IRON AND TIBC
Iron: 36 ug/dL — ABNORMAL LOW (ref 45–182)
Saturation Ratios: 13 % — ABNORMAL LOW (ref 17.9–39.5)
TIBC: 269 ug/dL (ref 250–450)
UIBC: 233 ug/dL

## 2022-08-17 LAB — VITAMIN B12: Vitamin B-12: 504 pg/mL (ref 180–914)

## 2022-08-17 LAB — FERRITIN: Ferritin: 39 ng/mL (ref 24–336)

## 2022-08-17 MED ORDER — BISACODYL 5 MG PO TBEC
10.0000 mg | DELAYED_RELEASE_TABLET | Freq: Once | ORAL | Status: AC
  Administered 2022-08-17: 10 mg via ORAL
  Filled 2022-08-17: qty 2

## 2022-08-17 MED ORDER — PEG-KCL-NACL-NASULF-NA ASC-C 100 G PO SOLR
0.5000 | Freq: Once | ORAL | Status: AC
  Administered 2022-08-17: 100 g via ORAL
  Filled 2022-08-17: qty 1

## 2022-08-17 MED ORDER — PEG-KCL-NACL-NASULF-NA ASC-C 100 G PO SOLR: 1.0000 | Freq: Once | ORAL | Status: DC

## 2022-08-17 MED ORDER — POTASSIUM CHLORIDE CRYS ER 20 MEQ PO TBCR
40.0000 meq | EXTENDED_RELEASE_TABLET | ORAL | Status: AC
  Administered 2022-08-17: 40 meq via ORAL
  Filled 2022-08-17: qty 2

## 2022-08-17 NOTE — H&P (View-Only) (Signed)
[facility] Progress Note  CC: Hematochezia, central abdominal pain  Subjective: He denies having any further central abdominal pain.  No hematochezia overnight but passed a moderate amount of bright red blood per the rectum without stool this morning.  No nausea or vomiting.  No chest pain.   Objective:   Colonoscopy 04/20/2022 from [facility] received:      Vital signs in last 24 hours: Temp:  [97.9 F (36.6 C)-98.8 F (37.1 C)] 98.8 F (37.1 C) (12/29 1209) Pulse Rate:  [63-98] 78 (12/29 1209) Resp:  [16-31] 16 (12/29 1209) BP: (139-232)/(67-127) 150/93 (12/29 1209) SpO2:  [93 %-100 %] 100 % (12/29 1209) Weight:  [92.9 kg] 92.9 kg (12/29 0240) Last BM Date : 08/17/22 General: 46 year old male in no acute distress. Heart: Regular rate and rhythm, no murmurs. Pulm: Breath sounds clear throughout. Abdomen: Soft, nontender.  Positive bowel sounds to all 4 quadrants.  No palpable mass. Extremities:  Without edema. Neurologic:  Alert and  oriented x 4. Grossly normal neurologically. Psych:  Alert and cooperative. Normal mood and affect.  Intake/Output from previous day: 12/28 0701 - 12/29 0700 In: -  Out: 800 [Urine:800] Intake/Output this shift: No intake/output data recorded.  Lab Results: Recent Labs    08/16/22 0622 08/17/22 0347 08/17/22 1110  WBC 3.6*  --   --   HGB 8.3* 8.6* 9.0*  HCT 26.4* 26.5* 26.4*  PLT 232  --   --    BMET Recent Labs    08/16/22 0622 08/17/22 0347  NA 140 138  K 3.5 2.7*  CL 110 103  CO2 23 23  GLUCOSE 104* 127*  BUN 33* 29*  CREATININE 4.46* 4.00*  CALCIUM 8.5* 8.4*   LFT Recent Labs    08/16/22 0622  PROT 6.5  ALBUMIN 3.0*  AST 30  ALT 36  ALKPHOS 85  BILITOT 0.9   PT/INR Recent Labs    08/16/22 0622  LABPROT 13.6  INR 1.1   Hepatitis Panel No results for input(s): "HEPBSAG", "HCVAB", "HEPAIGM", "HEPBIGM" in the last 72 hours.  CT ABDOMEN PELVIS WO CONTRAST  Result Date:  08/16/2022 CLINICAL DATA:  Abdominal pain.  Rectal bleeding EXAM: CT ABDOMEN AND PELVIS WITHOUT CONTRAST TECHNIQUE: Multidetector CT imaging of the abdomen and pelvis was performed following the standard protocol without IV contrast. RADIATION DOSE REDUCTION: This exam was performed according to the departmental dose-optimization program which includes automated exposure control, adjustment of the mA and/or kV according to patient size and/or use of iterative reconstruction technique. COMPARISON:  02/19/2021 FINDINGS: Lower chest: Heart size within normal limits. Moderate volume pericardial effusion. Included lung bases are clear. Hepatobiliary: Unremarkable unenhanced appearance of the liver. No focal liver lesion identified. Gallbladder within normal limits. No hyperdense gallstone. No biliary dilatation. Pancreas: Unremarkable. No pancreatic ductal dilatation or surrounding inflammatory changes. Spleen: Normal in size without focal abnormality. Adrenals/Urinary Tract: Enlarging lobulated right adrenal mass containing peripheral calcification. Mass now measures approximately 8.2 x 4.6 x 4.8 cm (previously 7.5 x 3.7 x 4.2 cm on 02/19/2021). Normal left adrenal gland. Kidneys within normal limits. Unchanged 1.3 cm cyst in the interpolar region of the left kidney, which does not require follow-up imaging. No renal stone or hydronephrosis. Mild diffuse urinary bladder wall thickening. Stomach/Bowel: Stomach is within normal limits. Appendix appears normal. No evidence of bowel wall thickening, distention, or inflammatory changes. Vascular/Lymphatic: No significant vascular abnormalities are seen. Mildly enlarged bilateral inguinal lymph nodes. No intra-or pelvic lymphadenopathy. Reproductive: Prostate is unremarkable.  Other: No free fluid. No abdominopelvic fluid collection. No pneumoperitoneum. No abdominal wall hernia. Musculoskeletal: No acute or significant osseous findings.  No IMPRESSION: 1. Mild diffuse  urinary bladder wall thickening. Correlate with urinalysis to exclude cystitis. 2. Enlarging lobulated right adrenal mass containing peripheral calcification. Mass now measures approximately 8.2 x 4.6 x 4.8 cm (previously 7.5 x 3.7 x 4.2 cm on 02/19/2021). Findings may represent pheochromocytoma although cystic adrenal carcinoma cannot be excluded by imaging alone. Suggest correlation with laboratory values and surgical consultation as a lesion of this size warrants consideration for surgical removal. If patient is not a surgical candidate or if further imaging is indicated,would consider further evaluation with pre and postcontrast adrenal protocol CT or MRI 3. Mildly enlarged bilateral inguinal lymph nodes, nonspecific. 4. Moderate volume pericardial effusion. Electronically Signed   By: Davina Poke D.O.   On: 08/16/2022 16:30    Assessment / Plan:  30) 46 year old male presented to the ED with abrupt onset central abdominal burning pain and hematochezia.  Admission hemoglobin 8.3 down (baseline hemoglobin 11) -> 8.3 -> today Hg 9.0. CTAP without contrast showed a normal stomach and bowel without evidence of diverticulosis/diverticulitis or colitis. Stool exam completed by the ED PA-C showed bright red bloody mucus with clay colored stool. He had 1 episode of bright red hematochezia this morning.  No further abdominal pain.  Colonoscopy 04/20/2022 per GI in [place] showed internal hemorrhoids otherwise was normal. The bowel prep was adequate. -Clear liquid diet then NPO after midnight -Continue to monitor the patient for active GI bleeding -Stat H/H if he has any further hematochezia  -Transfuse for hemoglobin less than 8  -Colonoscopy tomorrow benefits and risks discussed including risk with sedation, risk of bleeding, perforation and infection    2) Acute on chronic anemia, secondary to CKD and hematochezia.  Labs today showed iron deficiency anemia. Iron 36.  Saturation ratio is 13.  B12 504. Hg  9.0.  -Colonoscopy tomorrow as ordered above +/- EGD due to iron deficiency, await further recommendations per Dr. Lorenso Courier   3) AKI on CKD. Cr 4.46.   4) Hypertensive crisis, history of noncompliance with antihypertensive medications -Management per the medical service   5) Enlarging right adrenal mass.  Patient was seen by endocrinologist 01/2021 to evaluate 7.4 cm adrenal mass.  At that time, a 24-hour urine collection for catecholamines, metanephrines and cortisol was ordered, results not found in care everywhere. Surgical resection was recommended but was not done.  Urine catecholamines 02/21/2021 collected during his hospitalization were normal.  CTAP today showed the right adrenal mass measuring 8.2 x 4.6 x 0.8 cm. -General surgery consult   6) Moderate pericardial effusion, suspect due to significant hypertension    7) History of CAD s/p MI in 2010 and 09/2021   8) possible urinary bladder cystitis per CT -Evaluation/management per the medical service      Principal Problem:   GIB (gastrointestinal bleeding) Active Problems:   Class 1 obesity due to excess calories with serious comorbidity and body mass index (BMI) of 32.0 to 32.9 in adult   CKD (chronic kidney disease) stage 4, GFR 15-29 ml/min (HCC)   Anemia   Right adrenal mass (HCC)   Accelerated hypertension   Hypokalemia   Homelessness   Secondary hypertension   Hematochezia   Pericardial effusion   Coronary artery disease     LOS: 0 days   Noralyn Pick  08/17/2022, 12:30PM

## 2022-08-17 NOTE — Progress Notes (Signed)
  Progress Note   Patient: George Day ASN:[id number] DOB: April 19, 1976 DOA: 08/16/2022     0 DOS: the patient was seen and examined on 08/17/2022 at 1:00PM      Brief hospital course: George Day is a 46 y.o. male with medical history significant of uncontrolled HTN, with complications including CKD 4, CAD, reported stroke and MI, HTN cardiomyopathy, R adrenal mass.   Pt presents to the ED with c/o central abd burning pain and rectal bleeding.     Assessment and Plan: * GIB (gastrointestinal bleeding) - Consult GI - Trend Hgb  Coronary artery disease Should be on aspirin and statin  Pericardial effusion Clinically insignificant  Homelessness    Hypokalemia - Supplement K  Accelerated hypertension BP controlled on home meds - Continue Coreg, amlodipine, hydralazine  Right adrenal mass (HCC) No symptoms of adrenal secretory activity - Referred to Urology, Dr. Louis Meckel - Obtain DHEAs, PRA, salivary cortisol, plasma metanephrines, and will collect 24 hr urine metaneph and catecholamines  Anemia Stable - Trend Hgb  CKD (chronic kidney disease) stage 4, GFR 15-29 ml/min (HCC) Cr stable - Nephrology referral made  Class 1 obesity due to excess calories with serious comorbidity and body mass index (BMI) of 32.0 to 32.9 in adult BMI 30.2          Subjective: No pain, no chest pain, no further bleeding.     Physical Exam: BP (!) 147/89 (BP Location: Right Arm)   Pulse 76   Temp 98.7 F (37.1 C) (Oral)   Resp 18   Ht '5\' 9"'$  (1.753 m)   Wt 92.9 kg   SpO2 100%   BMI 30.24 kg/m   Adult male, sitting up in bed, interactive and appropriate RRR, no murmurs, no peripheral edema Respiratory rate normal, lungs clear without rales or wheezes Abdomen soft nontender to palpation or guarding   Data Reviewed: Blood count shows stable hemoglobin Patient metabolic panel shows stable renal function   Family Communication: Wife at the bedside              Author: Edwin Dada, MD 08/17/2022 6:52 PM  For on call review [email or web address].

## 2022-08-17 NOTE — ED Provider Notes (Cosign Needed Addendum)
[facility]PART[facility] Provider Note   CSN: [id number] Arrival date & time: 08/16/22  0607     History Chief Complaint  Patient presents with   Hematochezia    George Day is a 46 y.o. male uncontrolled HTN, with complications including CKD 4, reported stroke and MI, HTN cardiomyopathy, R adrenal mass presents to the ER for evaluation of diffuse, but mainly central, burning abdominal pain since this morning.  He reports he woke up around 0300 this morning and felt that his abdomen was "on fire".  He reports that he has had multiple bowel movements he is unsure if they were loose or solid.  He denies any pain with that, mention there was "lots of blood".  He is unsure whether there was clots or reports that he was unable to see his stool because there was so much blood in the toilet.  He reports some nausea but denies any vomiting or any fevers.  He is unsure whether he was having any melena as he was unable to see his stools.  He denies any history of any varices, EtOH use, or persistent NSAID use.  Patient recently moved here from [place] and sedated colonoscopy around 3 months ago that was normal.  He is not on any blood thinners.    HPI     Home Medications Prior to Admission medications   Medication Sig Start Date End Date Taking? Authorizing Provider  amLODipine (NORVASC) 10 MG tablet Take 1 tablet (10 mg total) by mouth daily. 03/07/21 03/07/22  Charlott Rakes, MD  aspirin EC 81 MG tablet Take 1 tablet (81 mg total) by mouth daily. Swallow whole. 08/29/20   Ladell Pier, MD  atorvastatin (LIPITOR) 40 MG tablet Take 1 tablet (40 mg total) by mouth daily. 03/07/21   Charlott Rakes, MD  carvedilol (COREG) 25 MG tablet Take 1 tablet (25 mg total) by mouth 2 (two) times daily with a meal. 03/07/21   Charlott Rakes, MD  fluticasone-salmeterol (ADVAIR) 250-50 MCG/ACT AEPB Inhale 1 puff into the lungs 2 (two) times daily. 03/07/21 03/07/22  Charlott Rakes,  MD  hydrALAZINE (APRESOLINE) 10 MG tablet Take 1 tablet (10 mg total) by mouth every 8 (eight) hours. 03/07/21   Charlott Rakes, MD  nitroGLYCERIN (NITROSTAT) 0.6 MG SL tablet Place 0.6 mg under the tongue every 5 (five) minutes as needed for chest pain.    [provider]  Vitamin D, Ergocalciferol, (DRISDOL) 1.25 MG (50000 UNIT) CAPS capsule Take 1 capsule (50,000 Units total) by mouth every 7 (seven) days. 03/03/21   Domenic Polite, MD      Allergies    Penicillins and Tomato    Review of Systems   Review of Systems  Constitutional:  Negative for chills and fever.  Respiratory:  Negative for shortness of breath.   Cardiovascular:  Negative for chest pain.  Gastrointestinal:  Positive for abdominal pain, blood in stool and nausea. Negative for vomiting.  Genitourinary:  Negative for dysuria and hematuria.    Physical Exam Updated Vital Signs BP (!) 154/89   Pulse 85   Temp 98.5 F (36.9 C) (Oral)   Resp (!) 23   SpO2 97%  Physical Exam Vitals and nursing note reviewed. Exam conducted with a chaperone present Soil scientist, RN).  Constitutional:      General: He is not in acute distress.    Appearance: Normal appearance. He is not ill-appearing or toxic-appearing.  HENT:     Head: Normocephalic and atraumatic.  Eyes:     General: No scleral icterus. Cardiovascular:     Rate and Rhythm: Normal rate and regular rhythm.  Pulmonary:     Effort: Pulmonary effort is normal. No respiratory distress.     Breath sounds: Normal breath sounds.  Abdominal:     General: Bowel sounds are normal.     Palpations: Abdomen is soft.     Tenderness: There is abdominal tenderness. There is no guarding or rebound.     Comments: Diffuse abdominal tenderness palpation.  Normal active bowel sounds.  No overlying skin changes noted.  Genitourinary:    Rectum: No external hemorrhoid or internal hemorrhoid.     Comments: No external hemorrhoids visible, no internal hemorrhoids palpated.   Patient does have clay colored stool with bloody mucus present on exam. No active bleeding. Musculoskeletal:        General: No deformity.     Cervical back: Normal range of motion.  Skin:    General: Skin is warm and dry.  Neurological:     General: No focal deficit present.     Mental Status: He is alert. Mental status is at baseline.     ED Results / Procedures / Treatments   Labs (all labs ordered are listed, but only abnormal results are displayed) Labs Reviewed  CBC WITH DIFFERENTIAL/PLATELET - Abnormal; Notable for the following components:      Result Value   WBC 3.6 (*)    RBC 3.69 (*)    Hemoglobin 8.3 (*)    HCT 26.4 (*)    MCV 71.5 (*)    MCH 22.5 (*)    RDW 16.5 (*)    All other components within normal limits  COMPREHENSIVE METABOLIC PANEL - Abnormal; Notable for the following components:   Glucose, Bld 104 (*)    BUN 33 (*)    Creatinine, Ser 4.46 (*)    Calcium 8.5 (*)    Albumin 3.0 (*)    GFR, Estimated 16 (*)    All other components within normal limits  RAPID URINE DRUG SCREEN, HOSP PERFORMED - Abnormal; Notable for the following components:   Tetrahydrocannabinol POSITIVE (*)    All other components within normal limits  POC OCCULT BLOOD, ED - Abnormal; Notable for the following components:   Fecal Occult Bld POSITIVE (*)    All other components within normal limits  LIPASE, BLOOD  PROTIME-INR  HIV ANTIBODY (ROUTINE TESTING W REFLEX)  FOLATE  RETICULOCYTES  HEMOGLOBIN AND HEMATOCRIT, BLOOD  HEMOGLOBIN AND HEMATOCRIT, BLOOD  BASIC METABOLIC PANEL  VITAMIN Q94  IRON AND TIBC  FERRITIN  HEMOGLOBIN AND HEMATOCRIT, BLOOD  HEMOGLOBIN AND HEMATOCRIT, BLOOD  TYPE AND SCREEN  ABO/RH    EKG EKG Interpretation  Date/Time:  Thursday August 16 2022 06:28:19 EST Ventricular Rate:  71 PR Interval:  178 QRS Duration: 88 QT Interval:  430 QTC Calculation: 467 R Axis:   67 Text Interpretation: Normal sinus rhythm Minimal voltage criteria for  LVH, may be normal variant ( Cornell product ) Nonspecific ST abnormality No significant change since last tracing When compared with ECG of 19-Feb-2021 03:22, PREVIOUS ECG IS PRESENT Confirmed by Blanchie Dessert [phone]) on 08/16/2022 3:45:06 PM  Radiology CT ABDOMEN PELVIS WO CONTRAST  Result Date: 08/16/2022 CLINICAL DATA:  Abdominal pain.  Rectal bleeding EXAM: CT ABDOMEN AND PELVIS WITHOUT CONTRAST TECHNIQUE: Multidetector CT imaging of the abdomen and pelvis was performed following the standard protocol without IV contrast. RADIATION DOSE REDUCTION: This exam was performed according to  the departmental dose-optimization program which includes automated exposure control, adjustment of the mA and/or kV according to patient size and/or use of iterative reconstruction technique. COMPARISON:  02/19/2021 FINDINGS: Lower chest: Heart size within normal limits. Moderate volume pericardial effusion. Included lung bases are clear. Hepatobiliary: Unremarkable unenhanced appearance of the liver. No focal liver lesion identified. Gallbladder within normal limits. No hyperdense gallstone. No biliary dilatation. Pancreas: Unremarkable. No pancreatic ductal dilatation or surrounding inflammatory changes. Spleen: Normal in size without focal abnormality. Adrenals/Urinary Tract: Enlarging lobulated right adrenal mass containing peripheral calcification. Mass now measures approximately 8.2 x 4.6 x 4.8 cm (previously 7.5 x 3.7 x 4.2 cm on 02/19/2021). Normal left adrenal gland. Kidneys within normal limits. Unchanged 1.3 cm cyst in the interpolar region of the left kidney, which does not require follow-up imaging. No renal stone or hydronephrosis. Mild diffuse urinary bladder wall thickening. Stomach/Bowel: Stomach is within normal limits. Appendix appears normal. No evidence of bowel wall thickening, distention, or inflammatory changes. Vascular/Lymphatic: No significant vascular abnormalities are seen. Mildly enlarged  bilateral inguinal lymph nodes. No intra-or pelvic lymphadenopathy. Reproductive: Prostate is unremarkable. Other: No free fluid. No abdominopelvic fluid collection. No pneumoperitoneum. No abdominal wall hernia. Musculoskeletal: No acute or significant osseous findings.  No IMPRESSION: 1. Mild diffuse urinary bladder wall thickening. Correlate with urinalysis to exclude cystitis. 2. Enlarging lobulated right adrenal mass containing peripheral calcification. Mass now measures approximately 8.2 x 4.6 x 4.8 cm (previously 7.5 x 3.7 x 4.2 cm on 02/19/2021). Findings may represent pheochromocytoma although cystic adrenal carcinoma cannot be excluded by imaging alone. Suggest correlation with laboratory values and surgical consultation as a lesion of this size warrants consideration for surgical removal. If patient is not a surgical candidate or if further imaging is indicated,would consider further evaluation with pre and postcontrast adrenal protocol CT or MRI 3. Mildly enlarged bilateral inguinal lymph nodes, nonspecific. 4. Moderate volume pericardial effusion. Electronically Signed   By: Davina Poke D.O.   On: 08/16/2022 16:30    Procedures Procedures   Medications Ordered in ED Medications  amLODipine (NORVASC) tablet 10 mg (has no administration in time range)  carvedilol (COREG) tablet 25 mg (has no administration in time range)  hydrALAZINE (APRESOLINE) tablet 10 mg (10 mg Oral Given 08/16/22 2148)  labetalol (NORMODYNE) injection 5-20 mg (has no administration in time range)  acetaminophen (TYLENOL) tablet 650 mg (has no administration in time range)    Or  acetaminophen (TYLENOL) suppository 650 mg (has no administration in time range)  ondansetron (ZOFRAN) tablet 4 mg (has no administration in time range)    Or  ondansetron (ZOFRAN) injection 4 mg (has no administration in time range)  hydrALAZINE (APRESOLINE) tablet 50 mg (50 mg Oral Given 08/16/22 1537)  NIFEdipine (PROCARDIA  XL/NIFEDICAL XL) 24 hr tablet 60 mg (60 mg Oral Given 08/16/22 1537)  isosorbide dinitrate (ISORDIL) tablet 20 mg (20 mg Oral Given 08/16/22 1537)    ED Course/ Medical Decision Making/ A&P                           Medical Decision Making Amount and/or Complexity of Data Reviewed Labs: ordered. Radiology: ordered.  Risk Prescription drug management. Decision regarding hospitalization.   46 year old male presents the emergency room today for evaluation of hematochezia with abdominal pain.  Differential diagnosis includes limited to internal hemorrhoids, external hemorrhoid, upper GI bleed, lower GI bleed, varices, malignancy, colitis, diverticulitis.  Vital signs show elevated blood pressure at 190/109,  afebrile, normal pulse rate, satting well on room air without increased work of breathing.  Physical exam as noted above.  Patient's wife had medication list at bedside.  I did order him his hydralazine, nifedipine, and Isordil as he did not take his medications today. Pharmacy reconciliation of medication ordered.   On previous chart evaluation, I am unable to see his colonoscopy.   Lab work up initiated in triage.   I independently reviewed and interpreted the patient's labs.  Lipase within normal limits.  INR within normal limits.  Fecal occult was positive.  CBC did show hemoglobin of 8.3 which his previous was 11.0-year ago.  Slight leukopenia at 3.6 as well.  Platelets within normal limits.  CMP shows an elevated creatinine of 4.46 which is elevated from his previous around 3.58.  Patient with known chronic kidney disease.  Slightly elevated glucose at 104.  Albumin at 3.0.  I CONSULTED GI and spoke with Dr. Lorenso Courier about this patient.  Recommended noncontrast CT and admission to medicine with their team to follow-up in the morning.  CT noncontrast abdomen shows  1. Mild diffuse urinary bladder wall thickening. Correlate with urinalysis to exclude cystitis. 2. Enlarging lobulated  right adrenal mass containing peripheral calcification. Mass now measures approximately 8.2 x 4.6 x 4.8 cm (previously 7.5 x 3.7 x 4.2 cm on 02/19/2021). Findings may represent pheochromocytoma although cystic adrenal carcinoma cannot be excluded by imaging alone. Suggest correlation with laboratory values and surgical consultation as a lesion of this size warrants consideration for surgical removal. If patient is not a surgical candidate or if further imaging is indicated,would consider further evaluation with pre and postcontrast adrenal protocol CT or MRI 3. Mildly enlarged bilateral inguinal lymph nodes, nonspecific. 4. Moderate volume pericardial effusion.  The patient reports that he has a known renal mass although it does appear bigger than it was previously on 02-19-2021.  Urinalysis added on given the mild diffuse urinary bladder wall thickening.  He does not have any urinary symptoms.  No signs of was causing his hematochezia.  Given the patient's almost 3 g drop as well as his abdominal pain, I think this patient benefit for admission with GI consultation inpatient.  His blood pressure has improved after receiving his morning medications.  Admit to triad hospitalist.  Final Clinical Impression(s) / ED Diagnoses Final diagnoses:  Hematochezia  Acute renal failure superimposed on chronic kidney disease, unspecified acute renal failure type, unspecified CKD stage (HCC)  Abnormal finding on imaging  Anemia, unspecified type    Rx / DC Orders ED Discharge Orders     None         Sherrell Puller, PA-C 08/17/22 0200    Sherrell Puller, PA-C 08/17/22 0201

## 2022-08-17 NOTE — Assessment & Plan Note (Signed)
Clinically insignificant ?

## 2022-08-17 NOTE — TOC Initial Note (Addendum)
Transition of Care [facility]) - Initial/Assessment Note    Patient Details  Name: George Day MRN: [id number] Date of Birth: 14-Jul-1976  Transition of Care [facility]) CM/SW Contact:    Pollie Friar, RN Phone Number: 08/17/2022, 3:32 PM  Clinical Narrative:                 Pt and his family are currently living out of their car. CM will provide them with shelter list to see if they are able to get into a shelter.  CM has placed information on the AVS for PCP for the patient. He will have to call and schedule as the office is closed today.  CM has also provided the number for [facility] to see if they can assist with motel vouchers for the family.  Pt has transportation home when medically ready.  He does still have medicaid through [place] so can not assist with medications at d/c.  TOC following.  1556: CM submitted [employer] 360 for assistance.  Expected Discharge Plan: Home/Self Care Barriers to Discharge: Continued Medical Work up   Patient Goals and CMS Choice            Expected Discharge Plan and Services   Discharge Planning Services: CM Consult   Living arrangements for the past 2 months: Homeless                                      Prior Living Arrangements/Services Living arrangements for the past 2 months: Homeless Lives with:: Minor Children, Spouse Patient language and need for interpreter reviewed:: Yes Do you feel safe going back to the place where you live?: Yes            Criminal Activity/Legal Involvement Pertinent to Current Situation/Hospitalization: No - Comment as needed  Activities of Daily Living Home Assistive Devices/Equipment: Cane (specify quad or straight) (straight, used 3 weeks ago d/t MS) ADL Screening (condition at time of admission) Patient's cognitive ability adequate to safely complete daily activities?: Yes Is the patient deaf or have difficulty hearing?: No Does the patient have difficulty seeing, even when wearing glasses/contacts?:  No Does the patient have difficulty concentrating, remembering, or making decisions?: No Patient able to express need for assistance with ADLs?: Yes Does the patient have difficulty dressing or bathing?: No Independently performs ADLs?: Yes (appropriate for developmental age) Does the patient have difficulty walking or climbing stairs?: No Weakness of Legs: Right Weakness of Arms/Hands: None  Permission Sought/Granted                  Emotional Assessment Appearance:: Appears stated age Attitude/Demeanor/Rapport: Engaged Affect (typically observed): Accepting Orientation: : Oriented to Self, Oriented to Place, Oriented to  Time, Oriented to Situation   Psych Involvement: No (comment)  Admission diagnosis:  Hematochezia [K92.1] GIB (gastrointestinal bleeding) [K92.2] Abnormal finding on imaging [R93.89] Anemia, unspecified type [D64.9] Acute renal failure superimposed on chronic kidney disease, unspecified acute renal failure type, unspecified CKD stage ([place]) [N17.9, N18.9] Patient Active Problem List   Diagnosis Date Noted   Pericardial effusion 08/17/2022   Coronary artery disease 08/17/2022   Homelessness 08/16/2022   GIB (gastrointestinal bleeding) 08/16/2022   Secondary hypertension 08/16/2022   Hematochezia 08/16/2022   Hypertensive crisis 02/19/2021   AKI (acute kidney injury) ([place])    Hypokalemia    Accelerated hypertension 02/16/2021   Right adrenal mass ([place]) 11/28/2020   Anemia 08/31/2020  Former smoker 08/29/2020   Hyperlipidemia associated with type 2 diabetes mellitus ([place]) 08/29/2020   Microcytic anemia 08/29/2020   Influenza vaccination declined 08/29/2020   23-polyvalent pneumococcal polysaccharide vaccine declined 08/29/2020   Chest pain 08/08/2020   GAD (generalized anxiety disorder) 02/25/2020   Hyperlipidemia 02/25/2020   CKD (chronic kidney disease) stage 4, GFR 15-29 ml/min ([place]) 02/25/2020   Diabetes mellitus ([place]) 01/05/2020   Essential  hypertension 01/05/2020   Tobacco dependence 01/05/2020   Coronary artery disease involving native coronary artery of native heart without angina pectoris 01/05/2020   History of CVA with residual deficit 01/05/2020   Moderate persistent asthma without complication 78/29/5621   Class 1 obesity due to excess calories with serious comorbidity and body mass index (BMI) of 32.0 to 32.9 in adult 01/05/2020   PCP:  Pcp, No Pharmacy:   [facility] [address] [place] [address] Phone: [phone] Fax: [phone]     Social Determinants of Health (SDOH) Social History: [place]: Food Insecurity Present (08/17/2022)  Housing: High Risk (08/17/2022)  Transportation Needs: No Transportation Needs (08/17/2022)  Utilities: Not At Risk (08/17/2022)  Depression (PHQ2-9): Low Risk  (11/28/2020)  Tobacco Use: Medium Risk (08/16/2022)   SDOH Interventions:     Readmission Risk Interventions     No data to display

## 2022-08-17 NOTE — Assessment & Plan Note (Signed)
BMI 30.2 

## 2022-08-17 NOTE — Hospital Course (Signed)
George Day is a 46 y.o. male with medical history significant of uncontrolled HTN, with complications including CKD 4, CAD, reported stroke and MI, HTN cardiomyopathy, R adrenal mass.   Pt presents to the ED with c/o central abd burning pain and rectal bleeding.

## 2022-08-17 NOTE — Assessment & Plan Note (Signed)
Resolved with supplementation and starting spironolactone. 

## 2022-08-17 NOTE — Progress Notes (Signed)
[facility] Progress Note  CC: Hematochezia, central abdominal pain  Subjective: He denies having any further central abdominal pain.  No hematochezia overnight but passed a moderate amount of bright red blood per the rectum without stool this morning.  No nausea or vomiting.  No chest pain.   Objective:   Colonoscopy 04/20/2022 from [facility] received:      Vital signs in last 24 hours: Temp:  [97.9 F (36.6 C)-98.8 F (37.1 C)] 98.8 F (37.1 C) (12/29 1209) Pulse Rate:  [63-98] 78 (12/29 1209) Resp:  [16-31] 16 (12/29 1209) BP: (139-232)/(67-127) 150/93 (12/29 1209) SpO2:  [93 %-100 %] 100 % (12/29 1209) Weight:  [92.9 kg] 92.9 kg (12/29 0240) Last BM Date : 08/17/22 General: 46 year old male in no acute distress. Heart: Regular rate and rhythm, no murmurs. Pulm: Breath sounds clear throughout. Abdomen: Soft, nontender.  Positive bowel sounds to all 4 quadrants.  No palpable mass. Extremities:  Without edema. Neurologic:  Alert and  oriented x 4. Grossly normal neurologically. Psych:  Alert and cooperative. Normal mood and affect.  Intake/Output from previous day: 12/28 0701 - 12/29 0700 In: -  Out: 800 [Urine:800] Intake/Output this shift: No intake/output data recorded.  Lab Results: Recent Labs    08/16/22 0622 08/17/22 0347 08/17/22 1110  WBC 3.6*  --   --   HGB 8.3* 8.6* 9.0*  HCT 26.4* 26.5* 26.4*  PLT 232  --   --    BMET Recent Labs    08/16/22 0622 08/17/22 0347  NA 140 138  K 3.5 2.7*  CL 110 103  CO2 23 23  GLUCOSE 104* 127*  BUN 33* 29*  CREATININE 4.46* 4.00*  CALCIUM 8.5* 8.4*   LFT Recent Labs    08/16/22 0622  PROT 6.5  ALBUMIN 3.0*  AST 30  ALT 36  ALKPHOS 85  BILITOT 0.9   PT/INR Recent Labs    08/16/22 0622  LABPROT 13.6  INR 1.1   Hepatitis Panel No results for input(s): "HEPBSAG", "HCVAB", "HEPAIGM", "HEPBIGM" in the last 72 hours.  CT ABDOMEN PELVIS WO CONTRAST  Result Date:  08/16/2022 CLINICAL DATA:  Abdominal pain.  Rectal bleeding EXAM: CT ABDOMEN AND PELVIS WITHOUT CONTRAST TECHNIQUE: Multidetector CT imaging of the abdomen and pelvis was performed following the standard protocol without IV contrast. RADIATION DOSE REDUCTION: This exam was performed according to the departmental dose-optimization program which includes automated exposure control, adjustment of the mA and/or kV according to patient size and/or use of iterative reconstruction technique. COMPARISON:  02/19/2021 FINDINGS: Lower chest: Heart size within normal limits. Moderate volume pericardial effusion. Included lung bases are clear. Hepatobiliary: Unremarkable unenhanced appearance of the liver. No focal liver lesion identified. Gallbladder within normal limits. No hyperdense gallstone. No biliary dilatation. Pancreas: Unremarkable. No pancreatic ductal dilatation or surrounding inflammatory changes. Spleen: Normal in size without focal abnormality. Adrenals/Urinary Tract: Enlarging lobulated right adrenal mass containing peripheral calcification. Mass now measures approximately 8.2 x 4.6 x 4.8 cm (previously 7.5 x 3.7 x 4.2 cm on 02/19/2021). Normal left adrenal gland. Kidneys within normal limits. Unchanged 1.3 cm cyst in the interpolar region of the left kidney, which does not require follow-up imaging. No renal stone or hydronephrosis. Mild diffuse urinary bladder wall thickening. Stomach/Bowel: Stomach is within normal limits. Appendix appears normal. No evidence of bowel wall thickening, distention, or inflammatory changes. Vascular/Lymphatic: No significant vascular abnormalities are seen. Mildly enlarged bilateral inguinal lymph nodes. No intra-or pelvic lymphadenopathy. Reproductive: Prostate is unremarkable.  Other: No free fluid. No abdominopelvic fluid collection. No pneumoperitoneum. No abdominal wall hernia. Musculoskeletal: No acute or significant osseous findings.  No IMPRESSION: 1. Mild diffuse  urinary bladder wall thickening. Correlate with urinalysis to exclude cystitis. 2. Enlarging lobulated right adrenal mass containing peripheral calcification. Mass now measures approximately 8.2 x 4.6 x 4.8 cm (previously 7.5 x 3.7 x 4.2 cm on 02/19/2021). Findings may represent pheochromocytoma although cystic adrenal carcinoma cannot be excluded by imaging alone. Suggest correlation with laboratory values and surgical consultation as a lesion of this size warrants consideration for surgical removal. If patient is not a surgical candidate or if further imaging is indicated,would consider further evaluation with pre and postcontrast adrenal protocol CT or MRI 3. Mildly enlarged bilateral inguinal lymph nodes, nonspecific. 4. Moderate volume pericardial effusion. Electronically Signed   By: Davina Poke D.O.   On: 08/16/2022 16:30    Assessment / Plan:  75) 46 year old male presented to the ED with abrupt onset central abdominal burning pain and hematochezia.  Admission hemoglobin 8.3 down (baseline hemoglobin 11) -> 8.3 -> today Hg 9.0. CTAP without contrast showed a normal stomach and bowel without evidence of diverticulosis/diverticulitis or colitis. Stool exam completed by the ED PA-C showed bright red bloody mucus with clay colored stool. He had 1 episode of bright red hematochezia this morning.  No further abdominal pain.  Colonoscopy 04/20/2022 per GI in [place] showed internal hemorrhoids otherwise was normal. The bowel prep was adequate. -Clear liquid diet then NPO after midnight -Continue to monitor the patient for active GI bleeding -Stat H/H if he has any further hematochezia  -Transfuse for hemoglobin less than 8  -Colonoscopy tomorrow benefits and risks discussed including risk with sedation, risk of bleeding, perforation and infection    2) Acute on chronic anemia, secondary to CKD and hematochezia.  Labs today showed iron deficiency anemia. Iron 36.  Saturation ratio is 13.  B12 504. Hg  9.0.  -Colonoscopy tomorrow as ordered above +/- EGD due to iron deficiency, await further recommendations per Dr. Lorenso Courier   3) AKI on CKD. Cr 4.46.   4) Hypertensive crisis, history of noncompliance with antihypertensive medications -Management per the medical service   5) Enlarging right adrenal mass.  Patient was seen by endocrinologist 01/2021 to evaluate 7.4 cm adrenal mass.  At that time, a 24-hour urine collection for catecholamines, metanephrines and cortisol was ordered, results not found in care everywhere. Surgical resection was recommended but was not done.  Urine catecholamines 02/21/2021 collected during his hospitalization were normal.  CTAP today showed the right adrenal mass measuring 8.2 x 4.6 x 0.8 cm. -General surgery consult   6) Moderate pericardial effusion, suspect due to significant hypertension    7) History of CAD s/p MI in 2010 and 09/2021   8) possible urinary bladder cystitis per CT -Evaluation/management per the medical service      Principal Problem:   GIB (gastrointestinal bleeding) Active Problems:   Class 1 obesity due to excess calories with serious comorbidity and body mass index (BMI) of 32.0 to 32.9 in adult   CKD (chronic kidney disease) stage 4, GFR 15-29 ml/min ([facility])   Anemia   Right adrenal mass ([facility])   Accelerated hypertension   Hypokalemia   Homelessness   Secondary hypertension   Hematochezia   Pericardial effusion   Coronary artery disease     LOS: 0 days   Noralyn Pick  08/17/2022, 12:30PM

## 2022-08-17 NOTE — Assessment & Plan Note (Signed)
Should be on aspirin and statin

## 2022-08-18 ENCOUNTER — Encounter (HOSPITAL_COMMUNITY)
Admission: EM | Disposition: A | Discharge status: Home / Self Care | Payer: Self-pay | Source: Home / Self Care | Attending: Emergency Medicine

## 2022-08-18 ENCOUNTER — Observation Stay (HOSPITAL_COMMUNITY): Payer: Medicaid Other | Admitting: Certified Registered"

## 2022-08-18 ENCOUNTER — Observation Stay (HOSPITAL_BASED_OUTPATIENT_CLINIC_OR_DEPARTMENT_OTHER): Payer: Medicaid Other | Admitting: Certified Registered"

## 2022-08-18 ENCOUNTER — Other Ambulatory Visit: Payer: Self-pay

## 2022-08-18 ENCOUNTER — Other Ambulatory Visit (HOSPITAL_COMMUNITY): Payer: Self-pay

## 2022-08-18 ENCOUNTER — Encounter (HOSPITAL_COMMUNITY): Payer: Self-pay | Admitting: Internal Medicine

## 2022-08-18 DIAGNOSIS — Z87891 Personal history of nicotine dependence: Secondary | ICD-10-CM

## 2022-08-18 DIAGNOSIS — D509 Iron deficiency anemia, unspecified: Secondary | ICD-10-CM

## 2022-08-18 DIAGNOSIS — K648 Other hemorrhoids: Secondary | ICD-10-CM

## 2022-08-18 DIAGNOSIS — D120 Benign neoplasm of cecum: Secondary | ICD-10-CM

## 2022-08-18 DIAGNOSIS — K921 Melena: Secondary | ICD-10-CM

## 2022-08-18 DIAGNOSIS — K2980 Duodenitis without bleeding: Secondary | ICD-10-CM

## 2022-08-18 DIAGNOSIS — K2981 Duodenitis with bleeding: Secondary | ICD-10-CM | POA: Diagnosis not present

## 2022-08-18 DIAGNOSIS — K2950 Unspecified chronic gastritis without bleeding: Secondary | ICD-10-CM

## 2022-08-18 DIAGNOSIS — I252 Old myocardial infarction: Secondary | ICD-10-CM

## 2022-08-18 DIAGNOSIS — I10 Essential (primary) hypertension: Secondary | ICD-10-CM

## 2022-08-18 DIAGNOSIS — K922 Gastrointestinal hemorrhage, unspecified: Secondary | ICD-10-CM | POA: Diagnosis not present

## 2022-08-18 DIAGNOSIS — I2510 Atherosclerotic heart disease of native coronary artery without angina pectoris: Secondary | ICD-10-CM

## 2022-08-18 HISTORY — PX: BIOPSY: SHX5522

## 2022-08-18 HISTORY — PX: ESOPHAGOGASTRODUODENOSCOPY (EGD) WITH PROPOFOL: SHX5813

## 2022-08-18 HISTORY — PX: COLONOSCOPY WITH PROPOFOL: SHX5780

## 2022-08-18 HISTORY — PX: POLYPECTOMY: SHX5525

## 2022-08-18 LAB — CBC
HCT: 26.7 % — ABNORMAL LOW (ref 39.0–52.0)
Hemoglobin: 8.7 g/dL — ABNORMAL LOW (ref 13.0–17.0)
MCH: 22.9 pg — ABNORMAL LOW (ref 26.0–34.0)
MCHC: 32.6 g/dL (ref 30.0–36.0)
MCV: 70.3 fL — ABNORMAL LOW (ref 80.0–100.0)
Platelets: 251 10*3/uL (ref 150–400)
RBC: 3.8 MIL/uL — ABNORMAL LOW (ref 4.22–5.81)
RDW: 16.3 % — ABNORMAL HIGH (ref 11.5–15.5)
WBC: 3.6 10*3/uL — ABNORMAL LOW (ref 4.0–10.5)
nRBC: 0 % (ref 0.0–0.2)

## 2022-08-18 LAB — BASIC METABOLIC PANEL
Anion gap: 12 (ref 5–15)
BUN: 24 mg/dL — ABNORMAL HIGH (ref 6–20)
CO2: 20 mmol/L — ABNORMAL LOW (ref 22–32)
Calcium: 8.6 mg/dL — ABNORMAL LOW (ref 8.9–10.3)
Chloride: 105 mmol/L (ref 98–111)
Creatinine, Ser: 3.74 mg/dL — ABNORMAL HIGH (ref 0.61–1.24)
GFR, Estimated: 19 mL/min — ABNORMAL LOW (ref >60)
Glucose, Bld: 94 mg/dL (ref 70–99)
Potassium: 3.4 mmol/L — ABNORMAL LOW (ref 3.5–5.1)
Sodium: 137 mmol/L (ref 135–145)

## 2022-08-18 LAB — GLUCOSE, CAPILLARY: Glucose-Capillary: 94 mg/dL (ref 70–99)

## 2022-08-18 SURGERY — ESOPHAGOGASTRODUODENOSCOPY (EGD) WITH PROPOFOL
Anesthesia: Monitor Anesthesia Care

## 2022-08-18 MED ORDER — CARVEDILOL 25 MG PO TABS
25.0000 mg | ORAL_TABLET | Freq: Two times a day (BID) | ORAL | 3 refills | Status: DC
  Filled 2022-08-18: qty 60, 30d supply, fill #0
  Filled 2022-09-15: qty 60, 30d supply, fill #1

## 2022-08-18 MED ORDER — PROPOFOL 10 MG/ML IV BOLUS
INTRAVENOUS | Status: DC | PRN
  Administered 2022-08-18: 10 mg via INTRAVENOUS
  Administered 2022-08-18: 50 mg via INTRAVENOUS
  Administered 2022-08-18: 20 mg via INTRAVENOUS

## 2022-08-18 MED ORDER — ATORVASTATIN CALCIUM 40 MG PO TABS
40.0000 mg | ORAL_TABLET | Freq: Every day | ORAL | 3 refills | Status: DC
  Filled 2022-08-18 – 2022-09-15 (×2): qty 30, 30d supply, fill #0

## 2022-08-18 MED ORDER — TORSEMIDE 20 MG PO TABS
40.0000 mg | ORAL_TABLET | Freq: Every day | ORAL | Status: DC
  Administered 2022-08-18: 40 mg via ORAL
  Filled 2022-08-18 (×2): qty 2

## 2022-08-18 MED ORDER — FERROUS SULFATE 325 (65 FE) MG PO TABS
325.0000 mg | ORAL_TABLET | Freq: Every day | ORAL | 3 refills | Status: DC
  Filled 2022-08-18 – 2022-09-15 (×2): qty 30, 30d supply, fill #0

## 2022-08-18 MED ORDER — CARVEDILOL 25 MG PO TABS
25.0000 mg | ORAL_TABLET | Freq: Two times a day (BID) | ORAL | 3 refills | Status: DC
  Filled 2022-08-18: qty 60, 30d supply, fill #0

## 2022-08-18 MED ORDER — SODIUM CHLORIDE 0.9 % IV SOLN: INTRAVENOUS | Status: DC

## 2022-08-18 MED ORDER — TORSEMIDE 20 MG PO TABS: 40.0000 mg | ORAL_TABLET | Freq: Every day | ORAL | Status: DC

## 2022-08-18 MED ORDER — NIFEDIPINE ER OSMOTIC RELEASE 60 MG PO TB24
60.0000 mg | ORAL_TABLET | Freq: Every day | ORAL | 3 refills | Status: DC
  Filled 2022-08-18: qty 30, 30d supply, fill #0
  Filled 2022-09-15: qty 30, 30d supply, fill #1

## 2022-08-18 MED ORDER — FERROUS SULFATE 325 (65 FE) MG PO TABS
325.0000 mg | ORAL_TABLET | Freq: Every day | ORAL | Status: DC
  Filled 2022-08-18: qty 1

## 2022-08-18 MED ORDER — PHENYLEPHRINE 80 MCG/ML (10ML) SYRINGE FOR IV PUSH (FOR BLOOD PRESSURE SUPPORT)
PREFILLED_SYRINGE | INTRAVENOUS | Status: DC | PRN
  Administered 2022-08-18 (×2): 80 ug via INTRAVENOUS

## 2022-08-18 MED ORDER — FERROUS SULFATE 325 (65 FE) MG PO TABS
325.0000 mg | ORAL_TABLET | Freq: Every day | ORAL | 3 refills | Status: DC
  Filled 2022-08-18: qty 30, 30d supply, fill #0

## 2022-08-18 MED ORDER — HYDRALAZINE HCL 50 MG PO TABS
50.0000 mg | ORAL_TABLET | Freq: Four times a day (QID) | ORAL | 3 refills | Status: DC
  Filled 2022-08-18: qty 90, 23d supply, fill #0
  Filled 2022-09-15: qty 90, 23d supply, fill #1

## 2022-08-18 MED ORDER — PROPOFOL 500 MG/50ML IV EMUL
INTRAVENOUS | Status: DC | PRN
  Administered 2022-08-18: 75 ug/kg/min via INTRAVENOUS

## 2022-08-18 MED ORDER — TRELEGY ELLIPTA 100-62.5-25 MCG/ACT IN AEPB
1.0000 | INHALATION_SPRAY | Freq: Every day | RESPIRATORY_TRACT | 3 refills | Status: DC
  Filled 2022-08-18: qty 60, fill #0

## 2022-08-18 MED ORDER — TRELEGY ELLIPTA 100-62.5-25 MCG/ACT IN AEPB
1.0000 | INHALATION_SPRAY | Freq: Every day | RESPIRATORY_TRACT | 3 refills | Status: DC
  Filled 2022-08-18: qty 60, fill #0
  Filled 2022-09-15: qty 60, 30d supply, fill #0

## 2022-08-18 MED ORDER — PANTOPRAZOLE SODIUM 40 MG PO TBEC
40.0000 mg | DELAYED_RELEASE_TABLET | Freq: Two times a day (BID) | ORAL | 1 refills | Status: DC
  Filled 2022-08-18 – 2022-09-15 (×2): qty 60, 30d supply, fill #0

## 2022-08-18 MED ORDER — HYDRALAZINE HCL 50 MG PO TABS
50.0000 mg | ORAL_TABLET | Freq: Four times a day (QID) | ORAL | 3 refills | Status: DC
  Filled 2022-08-18: qty 90, 23d supply, fill #0

## 2022-08-18 MED ORDER — ISOSORBIDE DINITRATE 20 MG PO TABS
20.0000 mg | ORAL_TABLET | Freq: Three times a day (TID) | ORAL | 3 refills | Status: DC
  Filled 2022-08-18: qty 90, 30d supply, fill #0

## 2022-08-18 MED ORDER — SODIUM CHLORIDE 0.9 % IV SOLN
INTRAVENOUS | Status: AC | PRN
  Administered 2022-08-18: 500 mL via INTRAVENOUS

## 2022-08-18 MED ORDER — HYDRALAZINE HCL 20 MG/ML IJ SOLN
10.0000 mg | Freq: Once | INTRAMUSCULAR | Status: AC
  Administered 2022-08-18: 10 mg via INTRAVENOUS

## 2022-08-18 MED ORDER — SODIUM CHLORIDE 0.9 % IV SOLN
250.0000 mg | Freq: Once | INTRAVENOUS | Status: AC
  Administered 2022-08-18: 250 mg via INTRAVENOUS
  Filled 2022-08-18: qty 20

## 2022-08-18 MED ORDER — HYDRALAZINE HCL 20 MG/ML IJ SOLN
INTRAMUSCULAR | Status: AC
  Filled 2022-08-18: qty 1

## 2022-08-18 MED ORDER — TORSEMIDE 20 MG PO TABS
40.0000 mg | ORAL_TABLET | Freq: Every day | ORAL | 3 refills | Status: DC
  Filled 2022-08-18 – 2022-09-15 (×3): qty 60, 30d supply, fill #0

## 2022-08-18 MED ORDER — CARVEDILOL 6.25 MG PO TABS
6.2500 mg | ORAL_TABLET | Freq: Two times a day (BID) | ORAL | 3 refills | Status: DC
  Filled 2022-08-18: qty 60, 30d supply, fill #0

## 2022-08-18 MED ORDER — HYDRALAZINE HCL 50 MG PO TABS
50.0000 mg | ORAL_TABLET | Freq: Three times a day (TID) | ORAL | Status: DC
  Administered 2022-08-18: 50 mg via ORAL
  Filled 2022-08-18 (×2): qty 1

## 2022-08-18 MED ORDER — ISOSORBIDE DINITRATE 20 MG PO TABS
20.0000 mg | ORAL_TABLET | Freq: Three times a day (TID) | ORAL | 3 refills | Status: DC
  Filled 2022-08-18: qty 45, 15d supply, fill #0
  Filled 2022-09-15: qty 90, 30d supply, fill #0

## 2022-08-18 MED ORDER — CARVEDILOL 6.25 MG PO TABS
6.2500 mg | ORAL_TABLET | Freq: Two times a day (BID) | ORAL | 3 refills | Status: DC
  Filled 2022-08-18: qty 60, 30d supply, fill #0
  Filled 2022-09-15: qty 60, 30d supply, fill #1

## 2022-08-18 MED ORDER — ATORVASTATIN CALCIUM 40 MG PO TABS
40.0000 mg | ORAL_TABLET | Freq: Every day | ORAL | 3 refills | Status: DC
  Filled 2022-08-18: qty 30, 30d supply, fill #0

## 2022-08-18 MED ORDER — TORSEMIDE 40 MG PO TABS
40.0000 mg | ORAL_TABLET | Freq: Every day | ORAL | 3 refills | Status: DC
  Filled 2022-08-18: qty 30, fill #0

## 2022-08-18 MED ORDER — PANTOPRAZOLE SODIUM 40 MG PO TBEC: 40.0000 mg | DELAYED_RELEASE_TABLET | Freq: Two times a day (BID) | ORAL | Status: DC

## 2022-08-18 MED ORDER — PANTOPRAZOLE SODIUM 40 MG PO TBEC
40.0000 mg | DELAYED_RELEASE_TABLET | Freq: Two times a day (BID) | ORAL | Status: DC
  Administered 2022-08-18: 40 mg via ORAL
  Filled 2022-08-18 (×2): qty 1

## 2022-08-18 MED ORDER — NIFEDIPINE ER OSMOTIC RELEASE 60 MG PO TB24
60.0000 mg | ORAL_TABLET | Freq: Every day | ORAL | 3 refills | Status: DC
  Filled 2022-08-18: qty 30, 30d supply, fill #0

## 2022-08-18 MED ORDER — ISOSORBIDE DINITRATE 20 MG PO TABS
20.0000 mg | ORAL_TABLET | Freq: Three times a day (TID) | ORAL | Status: DC
  Administered 2022-08-18: 20 mg via ORAL
  Filled 2022-08-18 (×3): qty 1

## 2022-08-18 MED ORDER — ISOSORBIDE DINITRATE 20 MG PO TABS
20.0000 mg | ORAL_TABLET | Freq: Three times a day (TID) | ORAL | Status: DC
  Filled 2022-08-18 (×2): qty 1

## 2022-08-18 SURGICAL SUPPLY — 25 items

## 2022-08-18 NOTE — Anesthesia Postprocedure Evaluation (Signed)
Anesthesia Post Note  Patient: George Day  Procedure(s) Performed: ESOPHAGOGASTRODUODENOSCOPY (EGD) WITH PROPOFOL COLONOSCOPY WITH PROPOFOL BIOPSY POLYPECTOMY     Patient location during evaluation: PACU Anesthesia Type: MAC Level of consciousness: awake and alert Pain management: pain level controlled Vital Signs Assessment: post-procedure vital signs reviewed and stable Respiratory status: spontaneous breathing, nonlabored ventilation, respiratory function stable and patient connected to nasal cannula oxygen Cardiovascular status: stable and blood pressure returned to baseline Postop Assessment: no apparent nausea or vomiting Anesthetic complications: no  No notable events documented.  Last Vitals:  Vitals:   08/18/22 0945 08/18/22 0955  BP: (!) 166/88 (!) 163/92  Pulse: 73 71  Resp: 16 17  Temp:  (!) 36.2 C  SpO2: 100% 100%    Last Pain:  Vitals:   08/18/22 0930  TempSrc:   PainSc: 0-No pain                 Tiajuana Amass

## 2022-08-18 NOTE — Op Note (Signed)
[facility] Patient Name: George Day Procedure Date : 08/18/2022 MRN: [id number] Attending MD: Georgian Co , , [id number] Date of Birth: [birth year] CSN: [id number] Age: 46 Admit Type: Inpatient Procedure:                Colonoscopy Indications:              Hematochezia, Iron deficiency anemia Providers:                Adline Mango" Carmin Richmond, RN,                            Cletis Athens, Technician Referring MD:             Hospitalist team Medicines:                Monitored Anesthesia Care Complications:            No immediate complications. Estimated blood loss:                            None. Estimated Blood Loss:     Estimated blood loss was minimal. Procedure:                Pre-Anesthesia Assessment:                           - Prior to the procedure, a History and Physical                            was performed, and patient medications and                            allergies were reviewed. The patient's tolerance of                            previous anesthesia was also reviewed. The risks                            and benefits of the procedure and the sedation                            options and risks were discussed with the patient.                            All questions were answered, and informed consent                            was obtained. Prior Anticoagulants: The patient has                            taken no anticoagulant or antiplatelet agents. ASA                            Grade Assessment: III - A patient with severe  systemic disease. After reviewing the risks and                            benefits, the patient was deemed in satisfactory                            condition to undergo the procedure.                           After obtaining informed consent, the colonoscope                            was passed under direct vision. Throughout the                             procedure, the patient's blood pressure, pulse, and                            oxygen saturations were monitored continuously. The                            [id number]-HQ190L ([id number] Olympus coloscope was                            introduced through the anus and advanced to the the                            cecum, identified by appendiceal orifice and                            ileocecal valve. The colonoscopy was performed                            without difficulty. The patient tolerated the                            procedure well. The quality of the bowel                            preparation was adequate. The ileocecal valve,                            appendiceal orifice, and rectum were photographed. Scope In: 9:12:35 AM Scope Out: 9:25:59 AM Scope Withdrawal Time: 0 hours 10 minutes 44 seconds  Total Procedure Duration: 0 hours 13 minutes 24 seconds  Findings:      A 3 mm polyp was found in the cecum. The polyp was sessile. The polyp       was removed with a cold snare. Resection and retrieval were complete.      Non-bleeding internal hemorrhoids were found during retroflexion. Impression:               - One 3 mm polyp in the cecum, removed with a cold  snare. Resected and retrieved.                           - Non-bleeding internal hemorrhoids. Recommendation:           - Return patient to hospital ward for ongoing care.                           - It is suspected that the patient's hematochezia                            may have been due to hemorrhoids. Anemia may be due                            to duodenal ulcer found on EGD.                           - Ordered IV iron infusion for today. Patient                            should start on daily oral iron supplements as an                            outpatient.                           - Await pathology results.                           - The findings and recommendations were discussed                             with the patient. Procedure Code(s):        --- Professional ---                           [phone], Colonoscopy, flexible; with removal of                            tumor(s), polyp(s), or other lesion(s) by snare                            technique Diagnosis Code(s):        --- Professional ---                           D12.0, Benign neoplasm of cecum                           K64.8, Other hemorrhoids                           K92.1, Melena (includes Hematochezia)                           D50.9, Iron deficiency anemia, unspecified CPT copyright 2022 American Medical Association. All rights reserved. The codes  documented in this report are preliminary and upon coder review may  be revised to meet current compliance requirements. Dr Georgian Co "Lyndee Leo" Lorenso Courier,  08/18/2022 9:36:09 AM Number of Addenda: 0

## 2022-08-18 NOTE — Interval H&P Note (Signed)
History and Physical Interval Note:  08/18/2022 8:29 AM  George Day  has presented today for surgery, with the diagnosis of hematochezia.  The various methods of treatment have been discussed with the patient and family. After consideration of risks, benefits and other options for treatment, the patient has consented to  Procedure(s): ESOPHAGOGASTRODUODENOSCOPY (EGD) WITH PROPOFOL (N/A) COLONOSCOPY WITH PROPOFOL (N/A) as a surgical intervention.  The patient's history has been reviewed, patient examined, no change in status, stable for surgery.  I have reviewed the patient's chart and labs.  Questions were answered to the patient's satisfaction.     Sharyn Creamer

## 2022-08-18 NOTE — Anesthesia Preprocedure Evaluation (Signed)
Anesthesia Evaluation  Patient identified by MRN, date of birth, ID band Patient awake    Reviewed: Allergy & Precautions, NPO status , Patient's Chart, lab work & pertinent test results  Airway Mallampati: I  TM Distance: >3 FB Neck ROM: Full    Dental  (+) Dental Advisory Given   Pulmonary asthma , sleep apnea , former smoker   breath sounds clear to auscultation       Cardiovascular hypertension, Pt. on medications and Pt. on home beta blockers + CAD and + Past MI   Rhythm:Regular Rate:Normal     Neuro/Psych CVA    GI/Hepatic negative GI ROS, Neg liver ROS,,,  Endo/Other  diabetes, Type 2  Adrenal mass  Renal/GU Renal disease     Musculoskeletal   Abdominal   Peds  Hematology  (+) Blood dyscrasia, anemia   Anesthesia Other Findings   Reproductive/Obstetrics                             Anesthesia Physical Anesthesia Plan  ASA: 3  Anesthesia Plan: MAC   Post-op Pain Management:    Induction:   PONV Risk Score and Plan: 1 and Propofol infusion  Airway Management Planned: Natural Airway and Nasal Cannula  Additional Equipment:   Intra-op Plan:   Post-operative Plan:   Informed Consent: I have reviewed the patients History and Physical, chart, labs and discussed the procedure including the risks, benefits and alternatives for the proposed anesthesia with the patient or authorized representative who has indicated his/her understanding and acceptance.       Plan Discussed with: CRNA  Anesthesia Plan Comments:        Anesthesia Quick Evaluation

## 2022-08-18 NOTE — Transfer of Care (Signed)
Immediate Anesthesia Transfer of Care Note  Patient: George Day  Procedure(s) Performed: ESOPHAGOGASTRODUODENOSCOPY (EGD) WITH PROPOFOL COLONOSCOPY WITH PROPOFOL BIOPSY POLYPECTOMY  Patient Location: PACU  Anesthesia Type:MAC  Level of Consciousness: sedated, drowsy, and patient cooperative  Airway & Oxygen Therapy: Patient Spontanous Breathing  Post-op Assessment: Report given to RN and Post -op Vital signs reviewed and stable  Post vital signs: Reviewed and stable  Last Vitals:  Vitals Value Taken Time  BP 126/71 08/18/22 0931  Temp    Pulse 73 08/18/22 0932  Resp 26 08/18/22 0932  SpO2 99 % 08/18/22 0932  Vitals shown include unvalidated device data.  Last Pain:  Vitals:   08/18/22 0826  TempSrc:   PainSc: 0-No pain         Complications: No notable events documented.

## 2022-08-18 NOTE — Op Note (Signed)
[facility] Patient Name: George Day Procedure Date : 08/18/2022 MRN: [id number] Attending MD: Georgian Co , , [id number] Date of Birth: 10/14/1975 CSN: [id number] Age: 46 Admit Type: Inpatient Procedure:                Upper GI endoscopy Indications:              Iron deficiency anemia, Hematochezia Providers:                Adline Mango" Carmin Richmond, RN,                            Cletis Athens, Technician Referring MD:             Hospitalist team Medicines:                Monitored Anesthesia Care Complications:            No immediate complications. Estimated Blood Loss:     Estimated blood loss was minimal. Procedure:                Pre-Anesthesia Assessment:                           - Prior to the procedure, a History and Physical                            was performed, and patient medications and                            allergies were reviewed. The patient's tolerance of                            previous anesthesia was also reviewed. The risks                            and benefits of the procedure and the sedation                            options and risks were discussed with the patient.                            All questions were answered, and informed consent                            was obtained. Prior Anticoagulants: The patient has                            taken no anticoagulant or antiplatelet agents. ASA                            Grade Assessment: III - A patient with severe                            systemic disease. After reviewing the risks and  benefits, the patient was deemed in satisfactory                            condition to undergo the procedure.                           After obtaining informed consent, the endoscope was                            passed under direct vision. Throughout the                            procedure, the patient's blood pressure, pulse, and                             oxygen saturations were monitored continuously. The                            [id number]-[id number] ([id number]) Olympus endoscope was introduced                            through the mouth, and advanced to the second part                            of duodenum. The upper GI endoscopy was                            accomplished without difficulty. The patient                            tolerated the procedure well. Scope In: Scope Out: Findings:      The examined esophagus was normal.      Clear fluid was found in the gastric body.      Biopsies were taken with a cold forceps on the greater curvature of the       gastric body, on the lesser curvature of the gastric body, at the       incisura, on the greater curvature of the gastric antrum and on the       lesser curvature of the gastric antrum for histology.      Localized inflammation characterized by congestion (edema), erythema,       nodularity and shallow ulcerations was found in the duodenal bulb.       Biopsies were taken with a cold forceps for histology. Impression:               - Normal esophagus.                           - Clear gastric fluid.                           - Duodenitis. Biopsied.                           - Biopsies were taken with a cold forceps for  histology on the greater curvature of the gastric                            body, on the lesser curvature of the gastric body,                            at the incisura, on the greater curvature of the                            gastric antrum and on the lesser curvature of the                            gastric antrum. Recommendation:           - Use a proton pump inhibitor PO BID for 8 weeks.                           - Await pathology results.                           - Perform a colonoscopy today. Procedure Code(s):        --- Professional ---                           [phone], Esophagogastroduodenoscopy, flexible,                             transoral; with biopsy, single or multiple Diagnosis Code(s):        --- Professional ---                           K29.80, Duodenitis without bleeding                           D50.9, Iron deficiency anemia, unspecified                           K92.1, Melena (includes Hematochezia) CPT copyright 2022 American Medical Association. All rights reserved. The codes documented in this report are preliminary and upon coder review may  be revised to meet current compliance requirements. Dr Georgian Co "Lyndee Leo" Lorenso Courier,  08/18/2022 9:10:56 AM Number of Addenda: 0

## 2022-08-18 NOTE — Discharge Summary (Signed)
Physician Discharge Summary   Patient: George Day MRN: [id number] DOB: 11-Mar-1976  Admit date:     08/16/2022  Discharge date: 08/18/22  Discharge Physician: Edwin Dada   PCP: Pcp, No     Recommendations at discharge:  Re-establish care with Nephrology, Dr. Candiss Norse Follow up with Urology, Dr. Louis Meckel for adrenal mass Dr. Louis Meckel:  Please follow up plasma renin-aldosterone ratio, DHEA, and plasma metanephrines Referral to Dr. Collier Flowers Endocrinology as needed 4.   Follow up with Gastroenterology Dr. Lorenso Courier for biopsy results     Discharge Diagnoses: Principal Problem:   GIB (gastrointestinal bleeding) Active Problems:   Class 1 obesity due to excess calories with serious comorbidity and body mass index (BMI) of 32.0 to 32.9 in adult   CKD (chronic kidney disease) stage 4, GFR 15-29 ml/min (HCC)   Anemia   Right adrenal mass (HCC)   Accelerated hypertension   Hypokalemia   Homelessness   Pericardial effusion   Coronary artery disease     Hospital Course: George Day is a 46 y.o. male with medical history significant of uncontrolled HTN, with complications including CKD 4, CAD, reported stroke and MI, HTN cardiomyopathy, R adrenal mass.   Pt presents to the ED with c/o central abd burning pain and rectal bleeding.   * GIB (gastrointestinal bleeding) Admitted and underwent EGD and colonoscopy that showed a nonbleeding duodenal ulcer and a rectal polyp.  Hgb stable in the hospital, no clinical bleeding.  Biopsies pending.  Discharged with PPI BID for 2 months.   Accelerated hypertension BP controlled easily on home meds.  Patient reported losing access to them in the move.    Discharged with refills of Coreg, hydralazine, isordil, torsemide, nifedipine.    Right adrenal mass (HCC) No symptoms of adrenal secretory activity  Referred to Urology.  Follow up DHEAs, PRA, plasma metanephrines     CKD (chronic kidney disease) stage 4, GFR  15-29 ml/min (HCC) Cr stable at baseline ~4 - Nephrology referral made  Class 1 obesity due to excess calories with serious comorbidity and body mass index (BMI) of 32.0 to 32.9 in adult BMI 30.2            The [place] was reviewed for this patient prior to discharge.  Diet recommendation:  Discharge Diet Orders (From admission, onward)     Start     Ordered   08/18/22 0000  Diet - low sodium heart healthy        08/18/22 1123             DISCHARGE MEDICATION: Allergies as of 08/18/2022       Reactions   Honey Bee Venom [bee Venom] Anaphylaxis   Penicillins Anaphylaxis   ALL   Tomato Swelling        Medication List     TAKE these medications    albuterol 108 (90 Base) MCG/ACT inhaler Commonly known as: VENTOLIN HFA Inhale 1-2 puffs into the lungs every 2 (two) hours as needed for wheezing or shortness of breath.   aspirin EC 81 MG tablet Take 1 tablet (81 mg total) by mouth daily. Swallow whole.   atorvastatin 40 MG tablet Commonly known as: LIPITOR Take 1 tablet (40 mg total) by mouth daily.   carvedilol 25 MG tablet Commonly known as: COREG Take 1 tablet (25 mg total) by mouth 2 (two) times daily with a meal. Take with 6.25 tablet for total 31.5 mg twice daily What changed: additional instructions  carvedilol 6.25 MG tablet Commonly known as: COREG Take 1 tablet (6.25 mg total) by mouth 2 (two) times daily. Take with 25 tablet for total 31.5 mg twice daily What changed:  medication strength additional instructions   docusate sodium 100 MG capsule Commonly known as: COLACE Take 100 mg by mouth 2 (two) times daily.   famotidine 20 MG tablet Commonly known as: PEPCID Take 20 mg by mouth daily.   ferrous sulfate 325 (65 FE) MG tablet Take 1 tablet (325 mg total) by mouth daily with breakfast. Start taking on: August 19, 2022   hydrALAZINE 50 MG tablet Commonly known as: APRESOLINE Take 1  tablet (50 mg total) by mouth 4 (four) times daily.   isosorbide dinitrate 20 MG tablet Commonly known as: ISORDIL Take 1 tablet (20 mg total) by mouth 3 (three) times daily.   LORazepam 0.5 MG tablet Commonly known as: ATIVAN Take 0.5 mg by mouth 2 (two) times daily.   NIFEdipine 60 MG 24 hr tablet Commonly known as: PROCARDIA XL/NIFEDICAL XL Take 1 tablet (60 mg total) by mouth daily.   nitroGLYCERIN 0.6 MG SL tablet Commonly known as: NITROSTAT Place 0.6 mg under the tongue every 5 (five) minutes as needed for chest pain.   pantoprazole 40 MG tablet Commonly known as: PROTONIX Take 1 tablet (40 mg total) by mouth 2 (two) times daily.   torsemide 20 MG tablet Commonly known as: DEMADEX Take 2 tablets (40 mg total) by mouth daily.   Trelegy Ellipta 100-62.5-25 MCG/ACT Aepb Generic drug: Fluticasone-Umeclidin-Vilant Inhale 1 puff into the lungs daily. What changed: medication strength   Vitamin D (Ergocalciferol) 1.25 MG (50000 UNIT) Caps capsule Commonly known as: DRISDOL Take 1 capsule (50,000 Units total) by mouth every 7 (seven) days.        Follow-up Information     [facility]. Schedule an appointment as soon as possible for a visit in 2 week(s).   Why: If you have issues making an appointment please call CM: [phone] Contact information: [address] [phone]        [facility] Follow up.   Why: please call on Tuesday to see if they can assist in a motel voucher or shelter placement. Contact information: [phone]        [facility] Follow up.   Why: Use this pharmacy to assist with the cost of medications Contact information: [address] [place], [place] [address]  Phone: [phone]        Gean Quint, MD. Call.   Specialty: Nephrology Why: for an appointment Contact information: [place] [address] [phone]          Ardis Hughs, MD. Call.   Specialty: Urology Why: for an appointment Contact information: [place] [address] [phone]                 Discharge Instructions     Diet - low sodium heart healthy   Complete by: As directed    Discharge instructions   Complete by: As directed    **IMPORTANT DISCHARGE INSTRUCTIONS**   From Dr. Loleta Books: You were admitted for high blood pressure and also found to have a bleeding ulcer  For the high blood pressure, we restarted your home medicines and this seemed to get better. I sent refills of all your home medicines to the [facility] as many of them  as you can If any are too costly, don't worry about it for now, just focus on getting your [place] Medicaid and seeing your PCP for refills.  When you get your prescriptions today at the [facility], you can ask them to transfer the prescriptions to the [facility] (where you used to go) if you would like, they may be willing to do this.   For the ulcer: Gastroenterology were consulted They did an EGD (endoscopy) and colonoscopy to look in the stomach and colon both, and saw that the bleeding was likely from an ulcer, the ulcer appears to have stopped bleeding  You should take pantoprazole 40 mg twice daily for 2 months If you can't get the pantoprazole, use a double dose of over the counter omeprazole/Prilosec Call Dr. Libby Maw office for a follow up appointment when your [facility] Medicaid goes through  Call her in 2 weeks to get biopsy results if you havent heard from her by then   Lastly, call Dr. Keturah Barre office at [facility] for follow up They might be reaching out to you first for schedule  And call [facility] for a follow up about the adrenal mass I spoke with Dr. Louis Meckel, and he also said they might be reaching out to you to schedule as well   Increase activity slowly    Complete by: As directed        Discharge Exam: Filed Weights   08/17/22 0240  Weight: 92.9 kg    General:  t is alert, awake, not in acute distress Cardiovascular: RRR, nl S1-S2, no murmurs appreciated.   No LE edema.   Respiratory: Normal respiratory rate and rhythm.  CTAB without rales or wheezes. Abdominal: Abdomen soft and non-tender.  No distension or HSM.   Neuro/Psych: Strength symmetric in upper and lower extremities.  Judgment and insight appear normal.   Condition at discharge: good  The results of significant diagnostics from this hospitalization (including imaging, microbiology, ancillary and laboratory) are listed below for reference.   Imaging Studies: CT ABDOMEN PELVIS WO CONTRAST  Result Date: 08/16/2022 CLINICAL DATA:  Abdominal pain.  Rectal bleeding EXAM: CT ABDOMEN AND PELVIS WITHOUT CONTRAST TECHNIQUE: Multidetector CT imaging of the abdomen and pelvis was performed following the standard protocol without IV contrast. RADIATION DOSE REDUCTION: This exam was performed according to the departmental dose-optimization program which includes automated exposure control, adjustment of the mA and/or kV according to patient size and/or use of iterative reconstruction technique. COMPARISON:  02/19/2021 FINDINGS: Lower chest: Heart size within normal limits. Moderate volume pericardial effusion. Included lung bases are clear. Hepatobiliary: Unremarkable unenhanced appearance of the liver. No focal liver lesion identified. Gallbladder within normal limits. No hyperdense gallstone. No biliary dilatation. Pancreas: Unremarkable. No pancreatic ductal dilatation or surrounding inflammatory changes. Spleen: Normal in size without focal abnormality. Adrenals/Urinary Tract: Enlarging lobulated right adrenal mass containing peripheral calcification. Mass now measures approximately 8.2 x 4.6 x 4.8 cm (previously 7.5 x 3.7 x 4.2 cm on 02/19/2021). Normal left adrenal gland. Kidneys within  normal limits. Unchanged 1.3 cm cyst in the interpolar region of the left kidney, which does not require follow-up imaging. No renal stone or hydronephrosis. Mild diffuse urinary bladder wall thickening. Stomach/Bowel: Stomach is within normal limits. Appendix appears normal. No evidence of bowel wall thickening, distention, or inflammatory changes. Vascular/Lymphatic: No significant vascular abnormalities are seen. Mildly enlarged bilateral inguinal lymph nodes. No intra-or pelvic lymphadenopathy. Reproductive: Prostate is unremarkable. Other: No free  fluid. No abdominopelvic fluid collection. No pneumoperitoneum. No abdominal wall hernia. Musculoskeletal: No acute or significant osseous findings.  No IMPRESSION: 1. Mild diffuse urinary bladder wall thickening. Correlate with urinalysis to exclude cystitis. 2. Enlarging lobulated right adrenal mass containing peripheral calcification. Mass now measures approximately 8.2 x 4.6 x 4.8 cm (previously 7.5 x 3.7 x 4.2 cm on 02/19/2021). Findings may represent pheochromocytoma although cystic adrenal carcinoma cannot be excluded by imaging alone. Suggest correlation with laboratory values and surgical consultation as a lesion of this size warrants consideration for surgical removal. If patient is not a surgical candidate or if further imaging is indicated,would consider further evaluation with pre and postcontrast adrenal protocol CT or MRI 3. Mildly enlarged bilateral inguinal lymph nodes, nonspecific. 4. Moderate volume pericardial effusion. Electronically Signed   By: Davina Poke D.O.   On: 08/16/2022 16:30    Microbiology: Results for orders placed or performed during the hospital encounter of 02/19/21  Resp Panel by RT-PCR (Flu A&B, Covid) Nasopharyngeal Swab     Status: None   Collection Time: 02/19/21  4:36 AM   Specimen: Nasopharyngeal Swab; Nasopharyngeal(NP) swabs in vial transport medium  Result Value Ref Range Status   SARS Coronavirus 2 by RT PCR  NEGATIVE NEGATIVE Final    Comment: (NOTE) SARS-CoV-2 target nucleic acids are NOT DETECTED.  The SARS-CoV-2 RNA is generally detectable in upper respiratory specimens during the acute phase of infection. The lowest concentration of SARS-CoV-2 viral copies this assay can detect is 138 copies/mL. A negative result does not preclude SARS-Cov-2 infection and should not be used as the sole basis for treatment or other patient management decisions. A negative result may occur with  improper specimen collection/handling, submission of specimen other than nasopharyngeal swab, presence of viral mutation(s) within the areas targeted by this assay, and inadequate number of viral copies(<138 copies/mL). A negative result must be combined with clinical observations, patient history, and epidemiological information. The expected result is Negative.  Fact Sheet for Patients:  EntrepreneurPulse.com.au  Fact Sheet for Healthcare Providers:  IncredibleEmployment.be  This test is no t yet approved or cleared by the [place] FDA and  has been authorized for detection and/or diagnosis of SARS-CoV-2 by FDA under an Emergency Use Authorization (EUA). This EUA will remain  in effect (meaning this test can be used) for the duration of the COVID-19 declaration under Section 564(b)(1) of the Act, 21 U.S.C.section 360bbb-3(b)(1), unless the authorization is terminated  or revoked sooner.       Influenza A by PCR NEGATIVE NEGATIVE Final   Influenza B by PCR NEGATIVE NEGATIVE Final    Comment: (NOTE) The Xpert Xpress SARS-CoV-2/FLU/RSV plus assay is intended as an aid in the diagnosis of influenza from Nasopharyngeal swab specimens and should not be used as a sole basis for treatment. Nasal washings and aspirates are unacceptable for Xpert Xpress SARS-CoV-2/FLU/RSV testing.  Fact Sheet for Patients: EntrepreneurPulse.com.au  Fact Sheet for  Healthcare Providers: IncredibleEmployment.be  This test is not yet approved or cleared by the [place] FDA and has been authorized for detection and/or diagnosis of SARS-CoV-2 by FDA under an Emergency Use Authorization (EUA). This EUA will remain in effect (meaning this test can be used) for the duration of the COVID-19 declaration under Section 564(b)(1) of the Act, 21 U.S.C. section 360bbb-3(b)(1), unless the authorization is terminated or revoked.  Performed at [facility], [place] [address] [place], [place] [address]   MRSA Next Gen by PCR, Nasal  Status: None   Collection Time: 02/19/21  8:00 PM   Specimen: Nasal Mucosa; Nasal Swab  Result Value Ref Range Status   MRSA by PCR Next Gen NOT DETECTED NOT DETECTED Final    Comment: (NOTE) The GeneXpert MRSA Assay (FDA approved for NASAL specimens only), is one component of a comprehensive MRSA colonization surveillance program. It is not intended to diagnose MRSA infection nor to guide or monitor treatment for MRSA infections. Test performance is not FDA approved in patients less than 67 years old. Performed at [facility], [place] [address] [place], [place] [address]     Labs: CBC: Recent Labs  Lab 08/16/22 0622 08/17/22 0347 08/17/22 1110 08/17/22 1808 08/18/22 0656  WBC 3.6*  --   --   --  3.6*  NEUTROABS 2.0  --   --   --   --   HGB 8.3* 8.6* 9.0* 9.2* 8.7*  HCT 26.4* 26.5* 26.4* 28.0* 26.7*  MCV 71.5*  --   --   --  70.3*  PLT 232  --   --   --  453   Basic Metabolic Panel: Recent Labs  Lab 08/16/22 0622 08/17/22 0347 08/18/22 0656  NA 140 138 137  K 3.5 2.7* 3.4*  CL 110 103 105  CO2 23 23 20*  GLUCOSE 104* 127* 94  BUN 33* 29* 24*  CREATININE 4.46* 4.00* 3.74*  CALCIUM 8.5* 8.4* 8.6*   Liver Function Tests: Recent Labs  Lab 08/16/22 0622  AST 30  ALT 36  ALKPHOS 85  BILITOT 0.9  PROT 6.5  ALBUMIN 3.0*   CBG: Recent Labs  Lab 08/18/22 0941  GLUCAP  94    Discharge time spent: approximately 35 minutes spent on discharge counseling, evaluation of patient on day of discharge, and coordination of discharge planning with nursing, social work, pharmacy and case management  Signed: Edwin Dada, MD [facility] 08/18/2022

## 2022-08-18 NOTE — Progress Notes (Signed)
Nursing Discharge Note   Admit Date: 08/16/2022    Discharge date: 08/18/2022   George Day is to be discharged home per MD order.  AVS completed. Reviewed with patient  at bedside. Highlighted copy provided for patient to take home.  Patient able to verbalize understanding of discharge instructions. PIV removed. Patient stable upon discharge.  Patient instructed to go to [facility] to pick up medications for blood pressure.   Discharge Instructions     Diet - low sodium heart healthy   Complete by: As directed    Discharge instructions   Complete by: As directed    **IMPORTANT DISCHARGE INSTRUCTIONS**   From Dr. Loleta Books: You were admitted for high blood pressure and also found to have a bleeding ulcer  For the high blood pressure, we restarted your home medicines and this seemed to get better. I sent refills of all your home medicines to the [facility] as many of them as you can If any are too costly, don't worry about it for now, just focus on getting your [place] Medicaid and seeing your PCP for refills.  When you get your prescriptions today at the [facility], you can ask them to transfer the prescriptions to the [place] at [facility] (where you used to go) if you would like, they may be willing to do this.   For the ulcer: Gastroenterology were consulted They did an EGD (endoscopy) and colonoscopy to look in the stomach and colon both, and saw that the bleeding was likely from an ulcer, the ulcer appears to have stopped bleeding  You should take pantoprazole 40 mg twice daily for 2 months If you can't get the pantoprazole, use a double dose of over the counter omeprazole/Prilosec Call Dr. Libby Maw office for a follow up appointment when your [facility] Medicaid goes through  Call her in 2 weeks to get biopsy results if you havent heard from her by then   Lastly, call Dr. Keturah Barre  office at [facility] for follow up They might be reaching out to you first for schedule  And call [facility] for a follow up about the adrenal mass I spoke with Dr. Louis Meckel, and he also said they might be reaching out to you to schedule as well   Increase activity slowly   Complete by: As directed        Allergies as of 08/18/2022       Reactions   Honey Bee Venom [bee Venom] Anaphylaxis   Penicillins Anaphylaxis   ALL   Tomato Swelling        Medication List     TAKE these medications    albuterol 108 (90 Base) MCG/ACT inhaler Commonly known as: VENTOLIN HFA Inhale 1-2 puffs into the lungs every 2 (two) hours as needed for wheezing or shortness of breath.   aspirin EC 81 MG tablet Take 1 tablet (81 mg total) by mouth daily. Swallow whole.   atorvastatin 40 MG tablet Commonly known as: LIPITOR Take 1 tablet (40 mg total) by mouth daily.   carvedilol 25 MG tablet Commonly known as: COREG Take 1 tablet (25 mg total) by mouth 2 (two) times daily with a meal. Take with 6.25 tablet for total 31.5 mg twice daily What changed: additional instructions   carvedilol 6.25 MG tablet Commonly known as: COREG Take 1 tablet (6.25 mg total) by mouth 2 (two) times daily. Take with 25 tablet for total 31.5  mg twice daily What changed:  medication strength additional instructions   docusate sodium 100 MG capsule Commonly known as: COLACE Take 100 mg by mouth 2 (two) times daily.   famotidine 20 MG tablet Commonly known as: PEPCID Take 20 mg by mouth daily.   ferrous sulfate 325 (65 FE) MG tablet Take 1 tablet (325 mg total) by mouth daily with breakfast. Start taking on: August 19, 2022   hydrALAZINE 50 MG tablet Commonly known as: APRESOLINE Take 1 tablet (50 mg total) by mouth 4 (four) times daily.   isosorbide dinitrate 20 MG tablet Commonly known as: ISORDIL Take 1 tablet (20 mg total) by mouth 3 (three) times daily.   LORazepam 0.5 MG  tablet Commonly known as: ATIVAN Take 0.5 mg by mouth 2 (two) times daily.   NIFEdipine 60 MG 24 hr tablet Commonly known as: PROCARDIA XL/NIFEDICAL XL Take 1 tablet (60 mg total) by mouth daily.   nitroGLYCERIN 0.6 MG SL tablet Commonly known as: NITROSTAT Place 0.6 mg under the tongue every 5 (five) minutes as needed for chest pain.   pantoprazole 40 MG tablet Commonly known as: PROTONIX Take 1 tablet (40 mg total) by mouth 2 (two) times daily.   torsemide 20 MG tablet Commonly known as: DEMADEX Take 2 tablets (40 mg total) by mouth daily.   Trelegy Ellipta 100-62.5-25 MCG/ACT Aepb Generic drug: Fluticasone-Umeclidin-Vilant Inhale 1 puff into the lungs daily. What changed: medication strength   Vitamin D (Ergocalciferol) 1.25 MG (50000 UNIT) Caps capsule Commonly known as: DRISDOL Take 1 capsule (50,000 Units total) by mouth every 7 (seven) days.        Discharge Instructions/ Education: Discharge instructions given to patient with verbalized understanding. Discharge education completed with patient including: follow up instructions, medication list, discharge activities, and limitations if indicated. Additional discharge instructions as indicated by discharging provider also reviewed. Patient and family able to verbalize understanding, all questions fully answered. Patient escorted via wheelchair to lobby and discharged home via private automobile.

## 2022-08-18 NOTE — Progress Notes (Signed)
Arrived back from Endo. Settled back to bed, tele monitor replaced. Vitals obtained.

## 2022-08-19 ENCOUNTER — Encounter (HOSPITAL_COMMUNITY): Payer: Self-pay | Admitting: Internal Medicine

## 2022-08-20 LAB — DHEA-SULFATE: DHEA-SO4: 42.2 ug/dL — ABNORMAL LOW (ref 71.6–375.4)

## 2022-08-21 ENCOUNTER — Other Ambulatory Visit: Payer: Self-pay

## 2022-08-22 ENCOUNTER — Encounter: Payer: Self-pay | Admitting: Internal Medicine

## 2022-08-22 LAB — SURGICAL PATHOLOGY

## 2022-08-26 LAB — ALDOSTERONE + RENIN ACTIVITY W/ RATIO
ALDO / PRA Ratio: 4 (ref 0.0–30.0)
Aldosterone: 12.2 ng/dL (ref 0.0–30.0)
PRA LC/MS/MS: 3.067 ng/mL/hr (ref 0.167–5.380)

## 2022-08-28 ENCOUNTER — Other Ambulatory Visit: Payer: Self-pay

## 2022-09-06 LAB — METANEPHRINES, PLASMA
Metanephrine, Free: 27.3 pg/mL (ref 0.0–88.0)
Normetanephrine, Free: 163.7 pg/mL (ref 0.0–218.9)

## 2022-09-15 ENCOUNTER — Encounter: Payer: Self-pay | Admitting: *Deleted

## 2022-09-15 ENCOUNTER — Other Ambulatory Visit (HOSPITAL_COMMUNITY): Payer: Self-pay

## 2022-09-16 NOTE — Congregational Nurse Program (Signed)
  Dept: [phone]   Congregational Nurse Program Note  Date of Encounter: 09/15/2022  Past Medical History: Past Medical History:  Diagnosis Date   Anemia 1998   Anginal pain ([place])    Anxiety    Asthma    Coronary artery disease    Diabetes mellitus without complication ([place]) 2800   Hypertension 2019   MI (myocardial infarction) ([place]) 2010   Sleep apnea    Stroke [facility]) 2010    Encounter Details:  CNP Questionnaire - 09/16/22 1745       Questionnaire   Ask client: Do you give verbal consent for me to treat you today? Yes    Location Patient Served  [employer]    Visit Setting with Client [place]    Patient Status Unhoused    Insurance Unknown    Insurance/Financial Assistance Referral N/A    Medication N/A    Screening Referrals Made N/A    Medical Referrals Made N/A    Food Referred to Food Pantry    Transportation N/A    Housing/Utilities N/A    Interpersonal Safety N/A    Interventions Educate;Referred to MyChart;Set up MyChart    Abnormal to Normal Screening Since Last CN Visit N/A    Screenings CN Performed Weight    Sent Client to Lab for: N/A    Did client attend any of the following based off CNs referral or appointments made? N/A    ED Visit Averted N/A    Life-Saving Intervention Made N/A             George Day needed assistance with setting up his my chart on his new phone. Discussed the importance of  Medication compliance and assisted him with requesting refills of all his medications. Strongly encouraged George Day to contact his doctor for an appointment for this upcoming week. [employer]

## 2022-09-17 ENCOUNTER — Emergency Department (HOSPITAL_COMMUNITY): Payer: Medicaid Other

## 2022-09-17 ENCOUNTER — Encounter (HOSPITAL_COMMUNITY): Payer: Self-pay | Admitting: *Deleted

## 2022-09-17 ENCOUNTER — Other Ambulatory Visit: Payer: Self-pay

## 2022-09-17 ENCOUNTER — Other Ambulatory Visit (HOSPITAL_COMMUNITY): Payer: Self-pay

## 2022-09-17 ENCOUNTER — Inpatient Hospital Stay (HOSPITAL_COMMUNITY)
Admission: EM | Admit: 2022-09-17 | Discharge: 2022-09-20 | DRG: 640 | Disposition: A | Discharge status: Home / Self Care | Payer: Medicaid Other | Attending: Family Medicine | Admitting: Family Medicine

## 2022-09-17 DIAGNOSIS — Z8673 Personal history of transient ischemic attack (TIA), and cerebral infarction without residual deficits: Secondary | ICD-10-CM

## 2022-09-17 DIAGNOSIS — Z5900 Homelessness unspecified: Secondary | ICD-10-CM

## 2022-09-17 DIAGNOSIS — E1122 Type 2 diabetes mellitus with diabetic chronic kidney disease: Secondary | ICD-10-CM | POA: Diagnosis present

## 2022-09-17 DIAGNOSIS — T501X6A Underdosing of loop [high-ceiling] diuretics, initial encounter: Secondary | ICD-10-CM | POA: Diagnosis present

## 2022-09-17 DIAGNOSIS — N250 Renal osteodystrophy: Secondary | ICD-10-CM | POA: Diagnosis present

## 2022-09-17 DIAGNOSIS — Z86711 Personal history of pulmonary embolism: Secondary | ICD-10-CM

## 2022-09-17 DIAGNOSIS — I1310 Hypertensive heart and chronic kidney disease without heart failure, with stage 1 through stage 4 chronic kidney disease, or unspecified chronic kidney disease: Secondary | ICD-10-CM | POA: Diagnosis present

## 2022-09-17 DIAGNOSIS — I252 Old myocardial infarction: Secondary | ICD-10-CM

## 2022-09-17 DIAGNOSIS — D509 Iron deficiency anemia, unspecified: Secondary | ICD-10-CM | POA: Diagnosis present

## 2022-09-17 DIAGNOSIS — J4540 Moderate persistent asthma, uncomplicated: Secondary | ICD-10-CM | POA: Diagnosis present

## 2022-09-17 DIAGNOSIS — I2510 Atherosclerotic heart disease of native coronary artery without angina pectoris: Secondary | ICD-10-CM | POA: Diagnosis present

## 2022-09-17 DIAGNOSIS — E278 Other specified disorders of adrenal gland: Secondary | ICD-10-CM | POA: Diagnosis present

## 2022-09-17 DIAGNOSIS — D631 Anemia in chronic kidney disease: Secondary | ICD-10-CM | POA: Diagnosis present

## 2022-09-17 DIAGNOSIS — E876 Hypokalemia: Secondary | ICD-10-CM | POA: Diagnosis present

## 2022-09-17 DIAGNOSIS — Z87891 Personal history of nicotine dependence: Secondary | ICD-10-CM

## 2022-09-17 DIAGNOSIS — E8770 Fluid overload, unspecified: Principal | ICD-10-CM | POA: Diagnosis present

## 2022-09-17 DIAGNOSIS — Z7982 Long term (current) use of aspirin: Secondary | ICD-10-CM

## 2022-09-17 DIAGNOSIS — N186 End stage renal disease: Secondary | ICD-10-CM | POA: Diagnosis present

## 2022-09-17 DIAGNOSIS — Z7951 Long term (current) use of inhaled steroids: Secondary | ICD-10-CM

## 2022-09-17 DIAGNOSIS — N184 Chronic kidney disease, stage 4 (severe): Secondary | ICD-10-CM | POA: Diagnosis present

## 2022-09-17 DIAGNOSIS — I25118 Atherosclerotic heart disease of native coronary artery with other forms of angina pectoris: Secondary | ICD-10-CM

## 2022-09-17 DIAGNOSIS — E785 Hyperlipidemia, unspecified: Secondary | ICD-10-CM | POA: Diagnosis present

## 2022-09-17 DIAGNOSIS — I5033 Acute on chronic diastolic (congestive) heart failure: Secondary | ICD-10-CM | POA: Diagnosis present

## 2022-09-17 DIAGNOSIS — R601 Generalized edema: Secondary | ICD-10-CM

## 2022-09-17 DIAGNOSIS — I160 Hypertensive urgency: Secondary | ICD-10-CM | POA: Diagnosis not present

## 2022-09-17 DIAGNOSIS — G4730 Sleep apnea, unspecified: Secondary | ICD-10-CM | POA: Diagnosis present

## 2022-09-17 DIAGNOSIS — G35 Multiple sclerosis: Secondary | ICD-10-CM | POA: Diagnosis present

## 2022-09-17 DIAGNOSIS — Z79899 Other long term (current) drug therapy: Secondary | ICD-10-CM

## 2022-09-17 DIAGNOSIS — Z833 Family history of diabetes mellitus: Secondary | ICD-10-CM

## 2022-09-17 DIAGNOSIS — E119 Type 2 diabetes mellitus without complications: Secondary | ICD-10-CM

## 2022-09-17 DIAGNOSIS — E279 Disorder of adrenal gland, unspecified: Secondary | ICD-10-CM | POA: Diagnosis present

## 2022-09-17 DIAGNOSIS — Z597 Insufficient social insurance and welfare support: Secondary | ICD-10-CM

## 2022-09-17 DIAGNOSIS — I10 Essential (primary) hypertension: Secondary | ICD-10-CM | POA: Diagnosis present

## 2022-09-17 DIAGNOSIS — Z5902 Unsheltered homelessness: Secondary | ICD-10-CM

## 2022-09-17 DIAGNOSIS — T465X6A Underdosing of other antihypertensive drugs, initial encounter: Secondary | ICD-10-CM | POA: Diagnosis present

## 2022-09-17 DIAGNOSIS — N185 Chronic kidney disease, stage 5: Secondary | ICD-10-CM | POA: Diagnosis present

## 2022-09-17 DIAGNOSIS — F419 Anxiety disorder, unspecified: Secondary | ICD-10-CM | POA: Diagnosis present

## 2022-09-17 LAB — MAGNESIUM: Magnesium: 2.5 mg/dL — ABNORMAL HIGH (ref 1.7–2.4)

## 2022-09-17 LAB — BASIC METABOLIC PANEL
Anion gap: 10 (ref 5–15)
BUN: 41 mg/dL — ABNORMAL HIGH (ref 6–20)
CO2: 22 mmol/L (ref 22–32)
Calcium: 8.4 mg/dL — ABNORMAL LOW (ref 8.9–10.3)
Chloride: 106 mmol/L (ref 98–111)
Creatinine, Ser: 4.1 mg/dL — ABNORMAL HIGH (ref 0.61–1.24)
GFR, Estimated: 17 mL/min — ABNORMAL LOW (ref >60)
Glucose, Bld: 101 mg/dL — ABNORMAL HIGH (ref 70–99)
Potassium: 3.8 mmol/L (ref 3.5–5.1)
Sodium: 138 mmol/L (ref 135–145)

## 2022-09-17 LAB — CBC
HCT: 28.8 % — ABNORMAL LOW (ref 39.0–52.0)
Hemoglobin: 9.6 g/dL — ABNORMAL LOW (ref 13.0–17.0)
MCH: 23.1 pg — ABNORMAL LOW (ref 26.0–34.0)
MCHC: 33.3 g/dL (ref 30.0–36.0)
MCV: 69.4 fL — ABNORMAL LOW (ref 80.0–100.0)
Platelets: 278 10*3/uL (ref 150–400)
RBC: 4.15 MIL/uL — ABNORMAL LOW (ref 4.22–5.81)
RDW: 18.1 % — ABNORMAL HIGH (ref 11.5–15.5)
WBC: 5.1 10*3/uL (ref 4.0–10.5)
nRBC: 0 % (ref 0.0–0.2)

## 2022-09-17 LAB — BRAIN NATRIURETIC PEPTIDE: B Natriuretic Peptide: 476.5 pg/mL — ABNORMAL HIGH (ref 0.0–100.0)

## 2022-09-17 MED ORDER — HYDRALAZINE HCL 50 MG PO TABS
50.0000 mg | ORAL_TABLET | Freq: Four times a day (QID) | ORAL | Status: DC
  Administered 2022-09-17 – 2022-09-18 (×2): 50 mg via ORAL
  Filled 2022-09-17 (×2): qty 2
  Filled 2022-09-17: qty 1

## 2022-09-17 MED ORDER — ATORVASTATIN CALCIUM 40 MG PO TABS
40.0000 mg | ORAL_TABLET | Freq: Every day | ORAL | Status: DC
  Administered 2022-09-17 – 2022-09-20 (×4): 40 mg via ORAL
  Filled 2022-09-17 (×4): qty 1

## 2022-09-17 MED ORDER — POTASSIUM CHLORIDE CRYS ER 20 MEQ PO TBCR
20.0000 meq | EXTENDED_RELEASE_TABLET | Freq: Once | ORAL | Status: AC
  Administered 2022-09-17: 20 meq via ORAL
  Filled 2022-09-17: qty 1

## 2022-09-17 MED ORDER — UMECLIDINIUM BROMIDE 62.5 MCG/ACT IN AEPB
1.0000 | INHALATION_SPRAY | Freq: Every day | RESPIRATORY_TRACT | Status: DC
  Administered 2022-09-18 – 2022-09-20 (×3): 1 via RESPIRATORY_TRACT
  Filled 2022-09-17: qty 7

## 2022-09-17 MED ORDER — ISOSORBIDE DINITRATE 10 MG PO TABS
20.0000 mg | ORAL_TABLET | Freq: Three times a day (TID) | ORAL | Status: DC
  Administered 2022-09-17 – 2022-09-18 (×3): 20 mg via ORAL
  Filled 2022-09-17 (×3): qty 2

## 2022-09-17 MED ORDER — ENOXAPARIN SODIUM 30 MG/0.3ML IJ SOSY
30.0000 mg | PREFILLED_SYRINGE | INTRAMUSCULAR | Status: DC
  Administered 2022-09-18 – 2022-09-20 (×3): 30 mg via SUBCUTANEOUS
  Filled 2022-09-17 (×3): qty 0.3

## 2022-09-17 MED ORDER — FLUTICASONE FUROATE-VILANTEROL 100-25 MCG/ACT IN AEPB
1.0000 | INHALATION_SPRAY | Freq: Every day | RESPIRATORY_TRACT | Status: DC
  Administered 2022-09-18 – 2022-09-20 (×3): 1 via RESPIRATORY_TRACT
  Filled 2022-09-17: qty 28

## 2022-09-17 MED ORDER — ACETAMINOPHEN 325 MG PO TABS
650.0000 mg | ORAL_TABLET | Freq: Four times a day (QID) | ORAL | Status: DC | PRN
  Administered 2022-09-17: 650 mg via ORAL
  Filled 2022-09-17: qty 2

## 2022-09-17 MED ORDER — ALBUTEROL SULFATE (2.5 MG/3ML) 0.083% IN NEBU: 2.5000 mg | INHALATION_SOLUTION | RESPIRATORY_TRACT | Status: DC | PRN

## 2022-09-17 MED ORDER — PANTOPRAZOLE SODIUM 40 MG PO TBEC
40.0000 mg | DELAYED_RELEASE_TABLET | Freq: Two times a day (BID) | ORAL | Status: DC
  Administered 2022-09-17 – 2022-09-20 (×6): 40 mg via ORAL
  Filled 2022-09-17 (×5): qty 1

## 2022-09-17 MED ORDER — NIFEDIPINE ER OSMOTIC RELEASE 60 MG PO TB24
60.0000 mg | ORAL_TABLET | Freq: Every day | ORAL | Status: DC
  Administered 2022-09-17 – 2022-09-20 (×4): 60 mg via ORAL
  Filled 2022-09-17 (×4): qty 1

## 2022-09-17 MED ORDER — HYDRALAZINE HCL 20 MG/ML IJ SOLN
5.0000 mg | Freq: Once | INTRAMUSCULAR | Status: AC
  Administered 2022-09-17: 5 mg via INTRAVENOUS
  Filled 2022-09-17: qty 1

## 2022-09-17 MED ORDER — FUROSEMIDE 10 MG/ML IJ SOLN
40.0000 mg | Freq: Once | INTRAMUSCULAR | Status: AC
  Administered 2022-09-17: 40 mg via INTRAVENOUS
  Filled 2022-09-17: qty 4

## 2022-09-17 MED ORDER — CARVEDILOL 12.5 MG PO TABS: 25.0000 mg | ORAL_TABLET | Freq: Two times a day (BID) | ORAL | Status: DC

## 2022-09-17 MED ORDER — CARVEDILOL 3.125 MG PO TABS: 6.2500 mg | ORAL_TABLET | Freq: Two times a day (BID) | ORAL | Status: DC

## 2022-09-17 MED ORDER — ALBUTEROL SULFATE HFA 108 (90 BASE) MCG/ACT IN AERS: 1.0000 | INHALATION_SPRAY | RESPIRATORY_TRACT | Status: DC | PRN

## 2022-09-17 MED ORDER — NITROGLYCERIN 2 % TD OINT
1.0000 [in_us] | TOPICAL_OINTMENT | Freq: Once | TRANSDERMAL | Status: AC
  Administered 2022-09-17: 1 [in_us] via TOPICAL
  Filled 2022-09-17: qty 1

## 2022-09-17 NOTE — Assessment & Plan Note (Signed)
Home statin

## 2022-09-17 NOTE — Assessment & Plan Note (Signed)
UTD A1C ordered, diet controlled. On statin.

## 2022-09-17 NOTE — Assessment & Plan Note (Signed)
Formulary equivalent to patient's trelegy and albuterol PRN. No O2 need.

## 2022-09-17 NOTE — Assessment & Plan Note (Signed)
   Patient presented with hypervolemia and weeping in the BLE, likely secondary to CKD stage IV and noncompliance to medications (unable to obtain these for the last week or so). Elevated BNP, rechecking echo today with last EF 50-55% 08/09/2020. Diuresing in hopes to improve kidney function as well. Last documented weight 92.9 kg (unsure of dry weight).  - Admit to FMTS, Dr. Nori Riis as attending - AM CBC/BMP/Mag - Echo pending - PT/OT evaluate and treat - Needs nephro outpatient versus inpatient - s/p Lasix 40 mg  - strict ins and outs - re-dose lasix in AM  -- daily weights

## 2022-09-17 NOTE — Assessment & Plan Note (Addendum)
Severely uncontrolled hypertension, restarted home hydralazine and Nifedipine started by EDP.  Will need to restart home nifedipine as well. Hold BB until echo complete.  - Vitals per floor protocol - Attempt to decrease blood pressure as able

## 2022-09-17 NOTE — Assessment & Plan Note (Signed)
Likely ACD, transfuse <8

## 2022-09-17 NOTE — ED Triage Notes (Signed)
Pt states he has MS and started having swelling to LE ever since. Today his legs hurting so bad he cannot go to work. Pt has swelling in right leg up to his thigh and to his left leg to knee. Palpable DPP bilaterally.  Pt has SOB with laying down.  Pt states he has CHF, COPD, MI, Stroke

## 2022-09-17 NOTE — H&P (Cosign Needed Addendum)
Hospital Admission History and Physical Service Pager: [phone]  Patient name: George Day Medical record number: [id number] Date of Birth: 27-Feb-1976 Age: 47 y.o. Gender: male  Primary Care Provider: Pcp, No Consultants: None  Code Status: FULL  Preferred Emergency Contact:  Contact Information     Name Relation Home Work Mobile   [place] Significant other   [phone]       Chief Complaint: Volume Overload  Assessment and Plan: George Day is a 47 y.o. male with PMHx of CKD stage IV following nephrology, adrenal mass, anemia, accelerated hypertension, housing insecurity, coronary artery disease, DM2, HLD, asthma presenting with volume overload.   * Hypervolemia   Patient presented with hypervolemia and weeping in the BLE, likely secondary to CKD stage IV and noncompliance to medications (unable to obtain these for the last week or so). Elevated BNP, rechecking echo today with last EF 50-55% 08/09/2020. Diuresing in hopes to improve kidney function as well. Last documented weight 92.9 kg (unsure of dry weight).  - Admit to FMTS, Dr. Nori Riis as attending - AM CBC/BMP/Mag - Echo pending - PT/OT evaluate and treat - Needs nephro outpatient versus inpatient - s/p Lasix 40 mg  - strict ins and outs - re-dose lasix in AM  -- daily weights  Homelessness Difficulty obtaining medications and has housing until 10/02/21 but has been living in their car intermittently. -- TOC consulted   Accelerated hypertension Severely uncontrolled hypertension, restarted home hydralazine and Nifedipine started by EDP.  Will need to restart home nifedipine as well. Hold BB until echo complete.  - Vitals per floor protocol - Attempt to decrease blood pressure as able   Right adrenal mass (HCC) DHEA low, plasma metanephrines, 24-hour urine metanephrine and catecholamines obtained during last admission and unremarkable, renin aldosterone nml. Surgical resection was recommended based  on size in 2022 when he saw endocrinologist in July. Last CT showed enlarging lobulated right adrenal mass, now measures approximately 8.2 x 4.6 x 4.8 cm (previously 7.5 x 3.7 x 4.2 cm on 02/19/2021). "Findings may represent pheochromocytoma although cystic adrenal carcinoma cannot be excluded." Unsure if general surgery was consulted on last admission but may be worthwhile to consult again. Needs BP control.    Microcytic anemia Likely ACD, transfuse <8  CKD (chronic kidney disease) stage 4, GFR 15-29 ml/min (HCC)  AKI on CKD in setting of severe uncontrolled HTN with medication noncompliance.  Likely a combination of cardiorenal syndrome and severely uncontrolled hypertension in addition to a right adrenal mass.  Amyloidosis unlikely as has been previously ruled out with normal kappa to lambda ratio.  Has follow-up with nephrology outpatient (Dr. Candiss Norse), has been unable to follow them routinely of late. -Strict ins and outs - Needs nephro outpatient versus inpatient - BMP -- Avoid nephrotoxic medications as able -- Control blood pressure as able  Hyperlipidemia Home statin   Moderate persistent asthma without complication Formulary equivalent to patient's trelegy and albuterol PRN. No O2 need.   Coronary artery disease involving native coronary artery of native heart without angina pectoris On ASA at home, restart when med rec completed.   Diabetes mellitus ([place]) UTD A1C ordered, diet controlled. On statin.     FEN/GI: Regular diet VTE Prophylaxis: Low dose lovenox   Disposition: med surg   History of Present Illness:  George Day is a 47 y.o. male presenting with hypervolemia onset a few days ago and weeping in the extremities approximately 3 days ago.  Reports that he had significant pain  in the right lower extremity this morning and noticed that the swelling has gradually worsened in both legs.  He has significant orthopnea that is been present for a couple of years.  He has  been able to obtain his medications because he suffers from housing insecurity and did not have medical insurance for quite some time.  In the same vein, he was unable to see nephrology with any routine because of this and did not follow-up with endocrinology or urology as previously discussed on last admission in December 2023.  He does not have a blood pressure cuff at home and does not check his pressures regularly, has not had his blood pressure medication for at least 1 week.  Patient denies chest pain, shortness of breath at rest, abdominal pain.  Patient reports that he has lived in unstable housing in the past, and intermittently lives in his car with his wife.  He has housing until October 02, 2022 but is unsure where he will be at that point.  He has difficulty obtaining his medications because he previously did not have medical insurance although he does report that he has some now.  In the ED, he received Lasix, hydralazine for uncontrolled blood pressure, and potassium for replenishment.  We will  Review Of Systems: Per HPI with the following additions: See Above  Pertinent Past Medical History: Past Medical History:  Diagnosis Date   Anemia 1998   Anginal pain ([place])    Anxiety    Asthma    Coronary artery disease    Diabetes mellitus without complication ([place]) 3016   Hypertension 2019   MI (myocardial infarction) ([place]) 2010   Sleep apnea    Stroke ([place]) 2010    Remainder reviewed in history tab.   Pertinent Past Surgical History: Past Surgical History:  Procedure Laterality Date   BIOPSY  08/18/2022   Procedure: BIOPSY;  Surgeon: Sharyn Creamer, MD;  Location: [facility] ENDOSCOPY;  Service: Gastroenterology;;   CARDIAC CATHETERIZATION  2010   COLONOSCOPY WITH PROPOFOL N/A 08/18/2022   Procedure: COLONOSCOPY WITH PROPOFOL;  Surgeon: Sharyn Creamer, MD;  Location: [place];  Service: Gastroenterology;  Laterality: N/A;   ESOPHAGOGASTRODUODENOSCOPY (EGD) WITH PROPOFOL N/A  08/18/2022   Procedure: ESOPHAGOGASTRODUODENOSCOPY (EGD) WITH PROPOFOL;  Surgeon: Sharyn Creamer, MD;  Location: [facility];  Service: Gastroenterology;  Laterality: N/A;   POLYPECTOMY  08/18/2022   Procedure: POLYPECTOMY;  Surgeon: Sharyn Creamer, MD;  [facility] ENDOSCOPY;  Service: Gastroenterology;;     Angus Seller reviewed in history tab.  Pertinent Social History: Tobacco use: Former Alcohol use: None  Other Substance use: None  Lives with wife in unstable housing   Pertinent Family History: Family History  Problem Relation Age of Onset   Diabetes Mother    Hyperlipidemia Mother    Diabetes Sister    Hyperlipidemia Sister    Hearing loss Sister    Diabetes Brother    Hyperlipidemia Brother      Remainder reviewed in history tab.   Important Outpatient Medications: No current facility-administered medications on file prior to encounter.   Current Outpatient Medications on File Prior to Encounter  Medication Sig Dispense Refill   albuterol (VENTOLIN HFA) 108 (90 Base) MCG/ACT inhaler Inhale 1-2 puffs into the lungs every 2 (two) hours as needed for wheezing or shortness of breath.     aspirin EC 81 MG tablet Take 1 tablet (81 mg total) by mouth daily. Swallow whole. 100 tablet 1   atorvastatin (LIPITOR) 40 MG  tablet Take 1 tablet (40 mg total) by mouth daily. 30 tablet 3   carvedilol (COREG) 25 MG tablet Take 1 tablet (25 mg total) by mouth 2 (two) times daily with a meal. Take with 6.25 tablet for total 31.5 mg twice daily 60 tablet 3   carvedilol (COREG) 6.25 MG tablet Take 1 tablet (6.25 mg total) by mouth 2 (two) times daily. Take with 25 tablet for total 31.5 mg twice daily 60 tablet 3   docusate sodium (COLACE) 100 MG capsule Take 100 mg by mouth 2 (two) times daily.     famotidine (PEPCID) 20 MG tablet Take 20 mg by mouth daily.     ferrous sulfate 325 (65 FE) MG tablet Take 1 tablet (325 mg total) by mouth daily with breakfast. 30 tablet 3    Fluticasone-Umeclidin-Vilant (TRELEGY ELLIPTA) 100-62.5-25 MCG/ACT AEPB Inhale 1 puff into the lungs daily. 60 each 3   hydrALAZINE (APRESOLINE) 50 MG tablet Take 1 tablet (50 mg total) by mouth 4 (four) times daily. 90 tablet 3   isosorbide dinitrate (ISORDIL) 20 MG tablet Take 1 tablet (20 mg total) by mouth 3 (three) times daily. 90 tablet 3   LORazepam (ATIVAN) 0.5 MG tablet Take 0.5 mg by mouth 2 (two) times daily.     NIFEdipine (PROCARDIA XL/NIFEDICAL XL) 60 MG 24 hr tablet Take 1 tablet (60 mg total) by mouth daily. 30 tablet 3   nitroGLYCERIN (NITROSTAT) 0.6 MG SL tablet Place 0.6 mg under the tongue every 5 (five) minutes as needed for chest pain.     pantoprazole (PROTONIX) 40 MG tablet Take 1 tablet (40 mg total) by mouth 2 (two) times daily. 60 tablet 1   torsemide (DEMADEX) 20 MG tablet Take 2 tablets (40 mg total) by mouth daily. 60 tablet 3   Vitamin D, Ergocalciferol, (DRISDOL) 1.25 MG (50000 UNIT) CAPS capsule Take 1 capsule (50,000 Units total) by mouth every 7 (seven) days. 5 capsule 0    Remainder reviewed in medication history.   Objective: BP (!) 170/95   Pulse 89   Temp 98.3 F (36.8 C) (Oral)   Resp (!) 25   SpO2 96%  Exam: General: Pleasant African-American male, slightly older than stated age Eyes: EOMI ENTM: No noticeable JVD on exam, no other abnormalities Neck: No cervical lymphadenopathy Cardiovascular: Regular rhythm, no murmurs gallops or rubs Respiratory: Slight focal diminishment bibasilar lung bases without wheezing, stridor, no increased work of breathing Gastrointestinal: No fluid wave, nontender to palpation, soft and nonperitoneal, bowel sounds present MSK: Able to move all extremities with significant 3+ pitting edema to the mid thigh on the right and to the knee on the left, DP pulses intact Derm: No noticeable rashes or open wounds Neuro: Neurologic baseline Psych: Normal affect and mood  Labs:  CBC BMET  Recent Labs  Lab 09/17/22 0711   WBC 5.1  HGB 9.6*  HCT 28.8*  PLT 278   Recent Labs  Lab 09/17/22 0711  NA 138  K 3.8  CL 106  CO2 22  BUN 41*  CREATININE 4.10*  GLUCOSE 101*  CALCIUM 8.4*     EKG: qtc 459, NSR    Imaging Studies Performed:  Imaging Study (ie. Chest x-ray) Impression from Radiologist: DG Chest 2 View  Result Date: 09/17/2022 CLINICAL DATA:  Shortness breath EXAM: CHEST - 2 VIEW COMPARISON:  CXR 02/20/21 FINDINGS: No pleural effusion. No pneumothorax. No focal airspace opacity. Cardiac and mediastinal contours are unchanged from prior exam. No displaced rib fractures.  Visualized upper abdomen is unremarkable. IMPRESSION: No focal airspace opacity. Electronically Signed   By: Marin Roberts M.D.   On: 09/17/2022 07:31      My Interpretation: As above, no acute findings    Erskine Emery, MD 09/17/2022, 5:55 PM PGY-2, [place] Intern pager: [phone], text pages welcome Secure chat group [place]

## 2022-09-17 NOTE — Assessment & Plan Note (Addendum)
Difficulty obtaining medications and has housing until 10/02/21 but has been living in their car intermittently. - Pending TOC consult - Ask about medication co-pays before prescribing

## 2022-09-17 NOTE — Assessment & Plan Note (Signed)
  AKI on CKD in setting of severe uncontrolled HTN with medication noncompliance.  Likely a combination of cardiorenal syndrome and severely uncontrolled hypertension in addition to a right adrenal mass.  Amyloidosis unlikely as has been previously ruled out with normal kappa to lambda ratio.  Has follow-up with nephrology outpatient (Dr. Candiss Norse), has been unable to follow them routinely of late. -Strict ins and outs - Needs nephro outpatient versus inpatient - BMP -- Avoid nephrotoxic medications as able -- Control blood pressure as able

## 2022-09-17 NOTE — Assessment & Plan Note (Deleted)
DHEA low, plasma metanephrines, 24-hour urine metanephrine and catecholamines unremarkable, renin aldosterone nml. Surgical resection was recommended based on size in 2022 when he saw endocrinologist in July. Last CT showed enlarging lobulated right adrenal mass, now measures approximately 8.2 x 4.6 x 4.8 cm (previously 7.5 x 3.7 x 4.2 cm on 02/19/2021). - scheduled outpatient appointment with urology Feb 8th

## 2022-09-17 NOTE — ED Provider Notes (Signed)
Patient presented to the ED for progressive exercise intolerance, and bilateral lower extremity swelling with associated pain.  Blood pressures are markedly elevated on arrival in the ED.  This is in the setting of medication nonadherence.  Patient states that he has not taken any of his home medications in the past week.  Patient is currently satting well on room air.  He does appear short of breath but attributes this to walking to the bathroom prior to my assessment.  Home blood pressure medications and IV Lasix were ordered.  Remaining evaluation and workup to be done by oncoming ED provider.   Godfrey Pick, MD 09/17/22 [phone]

## 2022-09-17 NOTE — Assessment & Plan Note (Signed)
On ASA at home, restart when med rec completed.

## 2022-09-17 NOTE — Progress Notes (Addendum)
FMTS Interim Progress Note  S:Night rounded w/ Dr. Larae Grooms. Pt reports swelling is improving, he is peeing a lot. He is hungry.  O: BP (!) 178/96   Pulse 96   Temp 98.3 F (36.8 C) (Oral)   Resp (!) 22   SpO2 97%   Resp: CTAB. Normal WOB on RA. CV: RRR  A/P: Will monitor UOP on [place] ON, plan to redose lasix in AM.  - diet ordered  Arlyce Dice, MD 09/17/2022, 7:49 PM PGY-1, [place] [facility] Service pager [phone]

## 2022-09-17 NOTE — ED Provider Notes (Signed)
Emergency Department Provider Note   I have reviewed the triage vital signs and the nursing notes.   HISTORY  Chief Complaint Leg Pain   HPI George Day is a 47 y.o. male with past history of CAD PE, diabetes, hypertension, GI bleeding presents to the emergency department for evaluation of significant swelling in both legs causing pain.  His home weight is up and feeling short of breath and significant orthopnea.  He must be sitting upright to feel comfortable with breathing.  No chest pain, fevers, chills.  He has been off of his medications for the past week including Lasix and hypertension medicines.  No radiation of symptoms or other modifying factors.   Past Medical History:  Diagnosis Date   Anemia 1998   Anginal pain ([place])    Anxiety    Asthma    Coronary artery disease    Diabetes mellitus without complication ([place]) 2595   Hypertension 2019   MI (myocardial infarction) ([place]) 2010   Sleep apnea    Stroke ([place]) 2010    Review of Systems  Constitutional: No fever/chills Cardiovascular: Denies chest pain. Respiratory: Positive shortness of breath. Gastrointestinal: No abdominal pain.  No nausea, no vomiting.  No diarrhea.  No constipation. Genitourinary: Negative for dysuria. Musculoskeletal: Negative for back pain. Bilateral LE swelling.  Skin: Negative for rash. Neurological: Negative for headaches.  ____________________________________________   PHYSICAL EXAM:  VITAL SIGNS: ED Triage Vitals  Enc Vitals Group     BP 09/17/22 0657 (!) 235/127     Pulse Rate 09/17/22 0657 69     Resp 09/17/22 0657 20     Temp 09/17/22 0657 98.4 F (36.9 C)     Temp Source 09/17/22 1538 Oral     SpO2 09/17/22 0657 100 %    Constitutional: Alert and oriented. Well appearing and in no acute distress. Eyes: Conjunctivae are normal.  Head: Atraumatic. Nose: No congestion/rhinnorhea. Mouth/Throat: Mucous membranes are moist.   Neck: No stridor.   Cardiovascular:  Normal rate, regular rhythm. Good peripheral circulation with palpable DP pulses bilaterally. Grossly normal heart sounds.   Respiratory: Normal respiratory effort.  No retractions. Lungs CTAB. Gastrointestinal: Soft and nontender. No distention.  Musculoskeletal: No lower extremity tenderness. Bilateral LE edema (pitting). No gross deformities of extremities. Neurologic:  Normal speech and language. No gross focal neurologic deficits are appreciated.  Skin:  Skin is warm, dry and intact. No rash noted.  ____________________________________________   LABS (all labs ordered are listed, but only abnormal results are displayed)  Labs Reviewed  BASIC METABOLIC PANEL - Abnormal; Notable for the following components:      Result Value   Glucose, Bld 101 (*)    BUN 41 (*)    Creatinine, Ser 4.10 (*)    Calcium 8.4 (*)    GFR, Estimated 17 (*)    All other components within normal limits  CBC - Abnormal; Notable for the following components:   RBC 4.15 (*)    Hemoglobin 9.6 (*)    HCT 28.8 (*)    MCV 69.4 (*)    MCH 23.1 (*)    RDW 18.1 (*)    All other components within normal limits  MAGNESIUM  BRAIN NATRIURETIC PEPTIDE   ____________________________________________  EKG   EKG Interpretation  Date/Time:  Monday September 17 2022 07:22:16 EST Ventricular Rate:  68 PR Interval:  162 QRS Duration: 88 QT Interval:  432 QTC Calculation: 459 R Axis:   56 Text Interpretation: Normal sinus rhythm Possible  Left atrial enlargement Left ventricular hypertrophy with repolarization abnormality ( Cornell product ) Cannot rule out Septal infarct , age undetermined Abnormal ECG Confirmed by Godfrey Pick [phone]) on 09/17/2022 2:24:29 PM        ____________________________________________  RADIOLOGY  DG Chest 2 View  Result Date: 09/17/2022 CLINICAL DATA:  Shortness breath EXAM: CHEST - 2 VIEW COMPARISON:  CXR 02/20/21 FINDINGS: No pleural effusion. No pneumothorax. No focal airspace  opacity. Cardiac and mediastinal contours are unchanged from prior exam. No displaced rib fractures. Visualized upper abdomen is unremarkable. IMPRESSION: No focal airspace opacity. Electronically Signed   By: Marin Roberts M.D.   On: 09/17/2022 07:31    ____________________________________________   PROCEDURES  Procedure(s) performed:   Procedures  None  ____________________________________________   INITIAL IMPRESSION / ASSESSMENT AND PLAN / ED COURSE  Pertinent labs & imaging results that were available during my care of the patient were reviewed by me and considered in my medical decision making (see chart for details).   This patient is Presenting for Evaluation of leg swelling and SOB, which does require a range of treatment options, and is a complaint that involves a high risk of morbidity and mortality.  The Differential Diagnoses includes but is not exclusive to acute coronary syndrome, aortic dissection, pulmonary embolism, cardiac tamponade, community-acquired pneumonia, pericarditis, musculoskeletal chest wall pain, etc.   Critical Interventions-    Medications  isosorbide dinitrate (ISORDIL) tablet 20 mg (20 mg Oral Given 09/17/22 1539)  NIFEdipine (PROCARDIA XL/NIFEDICAL XL) 24 hr tablet 60 mg (60 mg Oral Given 09/17/22 1539)  hydrALAZINE (APRESOLINE) injection 5 mg (5 mg Intravenous Given 09/17/22 1540)  nitroGLYCERIN (NITROGLYN) 2 % ointment 1 inch (1 inch Topical Given 09/17/22 1546)  furosemide (LASIX) injection 40 mg (40 mg Intravenous Given 09/17/22 1540)  potassium chloride SA (KLOR-CON M) CR tablet 20 mEq (20 mEq Oral Given 09/17/22 1538)    Reassessment after intervention:  BP improved slightly.    I did obtain Additional Historical Information from wife at bedside.  I decided to review pertinent External Data, and in summary patient's last admit was 07/2022 for GI bleeding. BP meds appear to have controlled BP well in the hospital.    Clinical Laboratory  Tests Ordered, included CKD noted with creatinine of 4.10. Mild anemia at 9.6.   Radiologic Tests Ordered, included CXR. I independently interpreted the images and agree with radiology interpretation.   Cardiac Monitor Tracing which shows NSR.   Social Determinants of Health Risk patient is not an active smoker.   Consult complete with Hospitalist. Plan for admit.   Medical Decision Making: Summary:  Patient presents emergency department with poorly controlled blood pressure, shortness of breath, orthopnea, significant swelling in the legs.  He is complaining of most of the pain he is having in his legs due to swelling which to me appears equal and bilateral.  He has intact pulses and sensation bilaterally.  No findings to suspect critical limb ischemia.  Exceedingly low suspicion for DVT as he seems to be more diffusely volume overloaded.  Blood pressure is starting to respond after IV hydralazine and Lasix.  He would benefit from continued creatinine monitoring and inpatient diuresis given his chronic kidney disease and poorly controlled blood pressures.   Reevaluation with update and discussion with patient and wife who are in agreement with plan for admit.   Patient's presentation is most consistent with acute presentation with potential threat to life or bodily function.   Disposition: admit  ____________________________________________  FINAL CLINICAL IMPRESSION(S) / ED DIAGNOSES  Final diagnoses:  Anasarca  Hypertensive urgency    Note:  This document was prepared using Dragon voice recognition software and may include unintentional dictation errors.  Nanda Quinton, MD, [facility] Emergency Medicine    Trajon Rosete, [facility], MD 09/17/22 2004

## 2022-09-18 ENCOUNTER — Observation Stay (HOSPITAL_COMMUNITY): Payer: Medicaid Other

## 2022-09-18 ENCOUNTER — Other Ambulatory Visit: Payer: Self-pay

## 2022-09-18 ENCOUNTER — Other Ambulatory Visit (HOSPITAL_COMMUNITY): Payer: Self-pay

## 2022-09-18 DIAGNOSIS — F419 Anxiety disorder, unspecified: Secondary | ICD-10-CM | POA: Diagnosis present

## 2022-09-18 DIAGNOSIS — D509 Iron deficiency anemia, unspecified: Secondary | ICD-10-CM | POA: Diagnosis not present

## 2022-09-18 DIAGNOSIS — Z87891 Personal history of nicotine dependence: Secondary | ICD-10-CM | POA: Diagnosis not present

## 2022-09-18 DIAGNOSIS — I2510 Atherosclerotic heart disease of native coronary artery without angina pectoris: Secondary | ICD-10-CM | POA: Diagnosis present

## 2022-09-18 DIAGNOSIS — Z597 Insufficient social insurance and welfare support: Secondary | ICD-10-CM | POA: Diagnosis not present

## 2022-09-18 DIAGNOSIS — E8770 Fluid overload, unspecified: Secondary | ICD-10-CM | POA: Diagnosis present

## 2022-09-18 DIAGNOSIS — G4730 Sleep apnea, unspecified: Secondary | ICD-10-CM | POA: Diagnosis present

## 2022-09-18 DIAGNOSIS — I5021 Acute systolic (congestive) heart failure: Secondary | ICD-10-CM

## 2022-09-18 DIAGNOSIS — I252 Old myocardial infarction: Secondary | ICD-10-CM | POA: Diagnosis not present

## 2022-09-18 DIAGNOSIS — Z86711 Personal history of pulmonary embolism: Secondary | ICD-10-CM | POA: Diagnosis not present

## 2022-09-18 DIAGNOSIS — Z8673 Personal history of transient ischemic attack (TIA), and cerebral infarction without residual deficits: Secondary | ICD-10-CM | POA: Diagnosis not present

## 2022-09-18 DIAGNOSIS — E876 Hypokalemia: Secondary | ICD-10-CM | POA: Diagnosis present

## 2022-09-18 DIAGNOSIS — R601 Generalized edema: Secondary | ICD-10-CM | POA: Diagnosis not present

## 2022-09-18 DIAGNOSIS — E1122 Type 2 diabetes mellitus with diabetic chronic kidney disease: Secondary | ICD-10-CM | POA: Diagnosis present

## 2022-09-18 DIAGNOSIS — G35 Multiple sclerosis: Secondary | ICD-10-CM | POA: Diagnosis present

## 2022-09-18 DIAGNOSIS — E785 Hyperlipidemia, unspecified: Secondary | ICD-10-CM | POA: Diagnosis present

## 2022-09-18 DIAGNOSIS — Z79899 Other long term (current) drug therapy: Secondary | ICD-10-CM | POA: Diagnosis not present

## 2022-09-18 DIAGNOSIS — D631 Anemia in chronic kidney disease: Secondary | ICD-10-CM | POA: Diagnosis present

## 2022-09-18 DIAGNOSIS — J4540 Moderate persistent asthma, uncomplicated: Secondary | ICD-10-CM | POA: Diagnosis present

## 2022-09-18 DIAGNOSIS — E279 Disorder of adrenal gland, unspecified: Secondary | ICD-10-CM | POA: Diagnosis present

## 2022-09-18 DIAGNOSIS — Z5902 Unsheltered homelessness: Secondary | ICD-10-CM | POA: Diagnosis not present

## 2022-09-18 DIAGNOSIS — I1310 Hypertensive heart and chronic kidney disease without heart failure, with stage 1 through stage 4 chronic kidney disease, or unspecified chronic kidney disease: Secondary | ICD-10-CM | POA: Diagnosis present

## 2022-09-18 DIAGNOSIS — I160 Hypertensive urgency: Secondary | ICD-10-CM | POA: Diagnosis present

## 2022-09-18 DIAGNOSIS — Z833 Family history of diabetes mellitus: Secondary | ICD-10-CM | POA: Diagnosis not present

## 2022-09-18 DIAGNOSIS — I5033 Acute on chronic diastolic (congestive) heart failure: Secondary | ICD-10-CM | POA: Diagnosis present

## 2022-09-18 DIAGNOSIS — N250 Renal osteodystrophy: Secondary | ICD-10-CM | POA: Diagnosis present

## 2022-09-18 DIAGNOSIS — N184 Chronic kidney disease, stage 4 (severe): Secondary | ICD-10-CM | POA: Diagnosis present

## 2022-09-18 LAB — BASIC METABOLIC PANEL
Anion gap: 13 (ref 5–15)
BUN: 36 mg/dL — ABNORMAL HIGH (ref 6–20)
CO2: 22 mmol/L (ref 22–32)
Calcium: 8.6 mg/dL — ABNORMAL LOW (ref 8.9–10.3)
Chloride: 106 mmol/L (ref 98–111)
Creatinine, Ser: 3.97 mg/dL — ABNORMAL HIGH (ref 0.61–1.24)
GFR, Estimated: 18 mL/min — ABNORMAL LOW (ref >60)
Glucose, Bld: 144 mg/dL — ABNORMAL HIGH (ref 70–99)
Potassium: 3.3 mmol/L — ABNORMAL LOW (ref 3.5–5.1)
Sodium: 141 mmol/L (ref 135–145)

## 2022-09-18 LAB — ECHOCARDIOGRAM COMPLETE
Area-P 1/2: 3.11 cm2
Height: 69 in
S' Lateral: 2.5 cm
Weight: 3280.44 oz

## 2022-09-18 LAB — CBC
HCT: 27.2 % — ABNORMAL LOW (ref 39.0–52.0)
Hemoglobin: 9.3 g/dL — ABNORMAL LOW (ref 13.0–17.0)
MCH: 23.2 pg — ABNORMAL LOW (ref 26.0–34.0)
MCHC: 34.2 g/dL (ref 30.0–36.0)
MCV: 67.8 fL — ABNORMAL LOW (ref 80.0–100.0)
Platelets: 288 10*3/uL (ref 150–400)
RBC: 4.01 MIL/uL — ABNORMAL LOW (ref 4.22–5.81)
RDW: 17.4 % — ABNORMAL HIGH (ref 11.5–15.5)
WBC: 6.5 10*3/uL (ref 4.0–10.5)
nRBC: 0 % (ref 0.0–0.2)

## 2022-09-18 LAB — MAGNESIUM: Magnesium: 2.3 mg/dL (ref 1.7–2.4)

## 2022-09-18 MED ORDER — POTASSIUM CHLORIDE CRYS ER 20 MEQ PO TBCR
30.0000 meq | EXTENDED_RELEASE_TABLET | Freq: Once | ORAL | Status: AC
  Administered 2022-09-18: 30 meq via ORAL
  Filled 2022-09-18: qty 1

## 2022-09-18 MED ORDER — ORAL CARE MOUTH RINSE: 15.0000 mL | OROMUCOSAL | Status: DC | PRN

## 2022-09-18 MED ORDER — ASPIRIN 81 MG PO TBEC
81.0000 mg | DELAYED_RELEASE_TABLET | Freq: Every day | ORAL | Status: DC
  Administered 2022-09-18 – 2022-09-20 (×3): 81 mg via ORAL
  Filled 2022-09-18 (×3): qty 1

## 2022-09-18 MED ORDER — FUROSEMIDE 10 MG/ML IJ SOLN
80.0000 mg | Freq: Once | INTRAMUSCULAR | Status: AC
  Administered 2022-09-18: 80 mg via INTRAVENOUS
  Filled 2022-09-18: qty 8

## 2022-09-18 MED ORDER — PERFLUTREN LIPID MICROSPHERE
1.0000 mL | INTRAVENOUS | Status: AC | PRN
  Administered 2022-09-18: 2 mL via INTRAVENOUS

## 2022-09-18 MED ORDER — ISOSORBIDE MONONITRATE ER 60 MG PO TB24
60.0000 mg | ORAL_TABLET | Freq: Every day | ORAL | Status: DC
  Administered 2022-09-19 – 2022-09-20 (×2): 60 mg via ORAL
  Filled 2022-09-18 (×2): qty 1

## 2022-09-18 MED ORDER — HYDRALAZINE HCL 50 MG PO TABS
100.0000 mg | ORAL_TABLET | Freq: Three times a day (TID) | ORAL | Status: DC
  Administered 2022-09-18 – 2022-09-20 (×6): 100 mg via ORAL
  Filled 2022-09-18 (×6): qty 2

## 2022-09-18 MED ORDER — FUROSEMIDE 10 MG/ML IJ SOLN
40.0000 mg | Freq: Once | INTRAMUSCULAR | Status: DC
  Filled 2022-09-18: qty 4

## 2022-09-18 MED ORDER — CARVEDILOL 25 MG PO TABS
25.0000 mg | ORAL_TABLET | Freq: Two times a day (BID) | ORAL | Status: DC
  Administered 2022-09-18 (×2): 25 mg via ORAL
  Filled 2022-09-18 (×2): qty 1

## 2022-09-18 NOTE — Progress Notes (Signed)
  Echocardiogram 2D Echocardiogram has been performed.  George Day 09/18/2022, 3:03 PM

## 2022-09-18 NOTE — Assessment & Plan Note (Addendum)
K 3.5 - ordered 20 mEq Klor 2/1

## 2022-09-18 NOTE — Progress Notes (Signed)
   09/18/22 1000  Spiritual Encounters  Type of Visit Initial  Care provided to: Patient  Referral source Patient request  Reason for visit Advance directives  OnCall Visit No  Spiritual Framework  Presenting Themes Values and beliefs  Values/beliefs patient's faith plays key role in his health decisions  Community/Connection Faith community;Family  Interventions  Spiritual Care Interventions Made Narrative/life review  Intervention Outcomes  Outcomes Connection to values and goals of care;Awareness of health   Ch went over AD with Pt. Pt says when his wife comes this afternoon they will fill out AD together. Ch assist pt with documenting values. Pt says that his wife and his older son are the most important persons in his life. They are the one who will help him make health decisions. No follow up needed at this time.

## 2022-09-18 NOTE — Progress Notes (Addendum)
Daily Progress Note Intern Pager: [phone]  Patient name: George Day Medical record number: [id number] Date of birth: 10-Dec-1975 Age: 47 y.o. Gender: male  Primary Care Provider: Pcp, No Consultants: None  Code Status: Full code   Pt Overview and Major Events to Date:  1/29: admitted   Assessment and Plan:  [name] is a 47 y.o. male with PMHx of CKD stage IV following nephrology, adrenal mass, anemia, accelerated hypertension, housing insecurity, coronary artery disease, DM2, HLD, asthma presenting with volume overload.   Called Nephrologist on call Dr. Augustin Coupe, he will help coordinate outpatient follow up for CKD4, does not appear to need a formal inpatient nephrology consult as patient is stable and improving on current management.   Appointment set up with Urology Feb 8th 2:30 pm to follow up with the adrenal mass.    * Hypervolemia Edema in LE improving. Likely secondary to CKD stage IV and noncompliance to medications. Echo pending. Last documented weight 92.9 kg, weight on admission 93.1 kg. Cr 4.10 > 3.97. Output ~425 cc s/p 40 mg Lasix IV. - daily CBC/BMP/Mag - Echo pending - PT/OT evaluate and treat - strict ins and outs - increase Lasix to 80 mg IV reordered  - daily weights  Accelerated hypertension Required one dose of 5 mg hydral for BP >220.   - increase home hydral to 100 mg TID  - continue Procardia 60 mg daily  - ordered Coreg 25 mg BID   Hypokalemia K 3.3  - ordered 40 mEq Klor - recheck BMP this afternoon, while continuing diuresis   CKD (chronic kidney disease) stage 4, GFR 15-29 ml/min (HCC) Cr improving. AKI on CKD in setting of severe uncontrolled HTN with medication noncompliance. - Strict ins and outs - daily BMP - Avoid nephrotoxic medications as able - Control BP  Homelessness Difficulty obtaining medications and has housing until 10/02/21 but has been living in their car intermittently. -- TOC consulted   Right adrenal mass  (HCC) DHEA low, plasma metanephrines, 24-hour urine metanephrine and catecholamines unremarkable, renin aldosterone nml. Surgical resection was recommended based on size in 2022 when he saw endocrinologist in July. Last CT showed enlarging lobulated right adrenal mass, now measures approximately 8.2 x 4.6 x 4.8 cm (previously 7.5 x 3.7 x 4.2 cm on 02/19/2021). - scheduled outpatient appointment with urology Feb 8th   Diabetes mellitus ([place]) UTD A1C ordered, diet controlled. On statin.    FEN/GI: Regular diet  PPx: Renally dosed Lovenox  Dispo:Home pending clinical improvement . Barriers include ongoing medical workup.   Subjective:  NAEO, patient reports feeling better this morning.   Objective: Temp:  [98 F (36.7 C)-98.8 F (37.1 C)] 98 F (36.7 C) (01/30 0722) Pulse Rate:  [83-112] 94 (01/30 0722) Resp:  [15-30] 18 (01/30 0722) BP: (170-250)/(90-128) 175/98 (01/30 0722) SpO2:  [94 %-100 %] 100 % (01/30 0722) Weight:  [93 kg-93.1 kg] 93 kg (01/30 0500) Physical Exam: Well-appearing, no acute distress Cardio: Regular rate, regular rhythm, no murmurs on exam. Pulm: Clear, no wheezing, no crackles. No increased work of breathing Abdominal: bowel sounds present, soft, non-tender, non-distended Extremities: +1-2 pitting edema, improving from yesterday  Neuro: alert and oriented x3, speech normal in content, no facial asymmetry, strength intact and equal bilaterally in UE and LE, pupils equal and reactive to light.   Laboratory: Most recent CBC Lab Results  Component Value Date   WBC 6.5 09/18/2022   HGB 9.3 (L) 09/18/2022   HCT 27.2 (L) 09/18/2022  MCV 67.8 (L) 09/18/2022   PLT 288 09/18/2022   Most recent BMP    Latest Ref Rng & Units 09/18/2022    6:58 AM  BMP  Glucose 70 - 99 mg/dL 144   BUN 6 - 20 mg/dL 36   Creatinine 0.61 - 1.24 mg/dL 3.97   Sodium 135 - 145 mmol/L 141   Potassium 3.5 - 5.1 mmol/L 3.3   Chloride 98 - 111 mmol/L 106   CO2 22 - 32 mmol/L 22    Calcium 8.9 - 10.3 mg/dL 8.6    Darci Current, DO 09/18/2022, 10:25 AM  PGY-1, [place] Intern pager: [phone], text pages welcome Secure chat group [place]

## 2022-09-18 NOTE — Progress Notes (Addendum)
Patient arrived from the ED. Patient walked to bed. Assessment and admission completed. BP 181/111. Markus Jarvis MD paged.  2343: BP 220/100 Manually Markus Jarvis MD notified. New orders given.  0500: zheng notified of Patients BP and for notification parameters. Per Markus Jarvis MD "notify if his BP is >740 systolic or >992 diastolic"

## 2022-09-18 NOTE — Progress Notes (Signed)
PT Cancellation Note  Patient Details Name: George Day MRN: [id number] DOB: [birth year]   Cancelled Treatment:    Reason Eval/Treat Not Completed: PT screened, no needs identified, will sign off (per OT and pt no acute therapy needs. Will sign off with pt aware and in agreement)   [name] [name] 09/18/2022, 11:48 AM [place] Office: [phone]

## 2022-09-18 NOTE — Progress Notes (Signed)
Occupational Therapy Evaluation Patient Details Name: Trevonn Hallum MRN: [id number] DOB: 1976-08-02 Today's Date: 09/18/2022   History of Present Illness 47 yo male admitted 1/29 with bil LE edema. PMhx: CKD, adrenal mass, anemia, HTN, CAD, T2DM, HLD, CVA, obesity.  Per pt he has history of MS but did not see in H&P.   Clinical Impression   Pt seen for OT eval.  Currently he is able to complete simulated selfcare tasks sit to stand with modified independence including transfers.  Will benefit from a tub seat for home as he has a history of falls in the shower and currently does not have his seat as it is up in [place].  No further acute or post acute OT needs at this time.  Oxygen sats at 97% on room air with HR elevating up into the 118 BPM with mobility.  Dyspnea 2/4 noted but improved from admission per pt report.        Recommendations for follow up therapy are one component of a multi-disciplinary discharge planning process, led by the attending physician.  Recommendations may be updated based on patient status, additional functional criteria and insurance authorization.   Follow Up Recommendations  No OT follow up     Assistance Recommended at Discharge None     Functional Status Assessment  Patient has not had a recent decline in their functional status  Equipment Recommendations  Tub/shower seat       Precautions / Restrictions Precautions Precautions: Fall Restrictions Weight Bearing Restrictions: No      Mobility Bed Mobility Overal bed mobility: Modified Independent                  Transfers Overall transfer level: Modified independent Equipment used: None               General transfer comment: Pt with increased right leg stiffness and altered gait pattern but no LOB with turns or mobility without use of an assistive device.      Balance Overall balance assessment: Mild deficits observed, not formally tested                                          ADL either performed or assessed with clinical judgement   ADL Overall ADL's : Modified independent                                       General ADL Comments: Pt currently modified independent for simulated selfcare tasks and functional transfers without use of an assistive device.  He did report having a shower seat up in [place] and would like to have one here, which therapist agrees with as he has had history of falling in the shower a couple months ago.  No further OT needs.     Vision Baseline Vision/History: 1 Wears glasses Ability to See in Adequate Light: 0 Adequate Patient Visual Report: No change from baseline Vision Assessment?: No apparent visual deficits     Perception  NT   Praxis  NT    Pertinent Vitals/Pain Pain Assessment Pain Assessment: 0-10 Pain Score: 2  Pain Location: LEs Pain Descriptors / Indicators: Discomfort Pain Intervention(s): Limited activity within patient's tolerance, Repositioned     Hand Dominance Right   Extremity/Trunk Assessment Upper Extremity Assessment Upper Extremity Assessment: Overall  WFL for tasks assessed   Lower Extremity Assessment Lower Extremity Assessment: Defer to PT evaluation   Cervical / Trunk Assessment Cervical / Trunk Assessment: Normal   Communication Communication Communication: No difficulties   Cognition Arousal/Alertness: Awake/alert Behavior During Therapy: WFL for tasks assessed/performed Overall Cognitive Status: Within Functional Limits for tasks assessed                                                  Home Living Family/patient expects to be discharged to:: Other (Comment) (at hotel currently just moved down here from [place].  Working on getting an apartment) Living Arrangements: Spouse/significant other (lives with spouse and children) Available Help at Discharge: Family;Available 24 hours/day Type of Home: Other(Comment) (hotel room currently)        Home Layout: One level     Bathroom Shower/Tub: Teacher, early years/pre: Standard Bathroom Accessibility: Yes   Home Equipment: Grab bars - tub/shower   Additional Comments: no equipment with him at the hotel      Prior Functioning/Environment Prior Level of Function : Independent/Modified Independent             Mobility Comments: sometimes would furniture walk when he gets off balance.  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, but nothing in the chart ADLs Comments: independent/modified independent         AM-PAC OT "6 Clicks" Daily Activity     Outcome Measure Help from another person eating meals?: None Help from another person taking care of personal grooming?: None Help from another person toileting, which includes using toliet, bedpan, or urinal?: None Help from another person bathing (including washing, rinsing, drying)?: None Help from another person to put on and taking off regular upper body clothing?: None Help from another person to put on and taking off regular lower body clothing?: None 6 Click Score: 24   End of Session Equipment Utilized During Treatment: Gait belt Nurse Communication: Mobility status  Activity Tolerance: Patient tolerated treatment well Patient left: in bed;with call bell/phone within reach                   Time: 3291-9166 OT Time Calculation (min): 27 min Charges:  OT General Charges $OT Visit: 1 Visit OT Evaluation $OT Eval Low Complexity: 1 Low  Din Bookwalter OTR/L 09/18/2022, 9:17 AM

## 2022-09-19 ENCOUNTER — Other Ambulatory Visit (HOSPITAL_COMMUNITY): Payer: Self-pay

## 2022-09-19 DIAGNOSIS — E8770 Fluid overload, unspecified: Secondary | ICD-10-CM | POA: Diagnosis not present

## 2022-09-19 LAB — CBC
HCT: 26.2 % — ABNORMAL LOW (ref 39.0–52.0)
Hemoglobin: 9 g/dL — ABNORMAL LOW (ref 13.0–17.0)
MCH: 23 pg — ABNORMAL LOW (ref 26.0–34.0)
MCHC: 34.4 g/dL (ref 30.0–36.0)
MCV: 67 fL — ABNORMAL LOW (ref 80.0–100.0)
Platelets: 296 10*3/uL (ref 150–400)
RBC: 3.91 MIL/uL — ABNORMAL LOW (ref 4.22–5.81)
RDW: 17.6 % — ABNORMAL HIGH (ref 11.5–15.5)
WBC: 6 10*3/uL (ref 4.0–10.5)
nRBC: 0 % (ref 0.0–0.2)

## 2022-09-19 LAB — IRON AND TIBC
Iron: 29 ug/dL — ABNORMAL LOW (ref 45–182)
Saturation Ratios: 10 % — ABNORMAL LOW (ref 17.9–39.5)
TIBC: 286 ug/dL (ref 250–450)
UIBC: 257 ug/dL

## 2022-09-19 LAB — BASIC METABOLIC PANEL
Anion gap: 9 (ref 5–15)
BUN: 38 mg/dL — ABNORMAL HIGH (ref 6–20)
CO2: 23 mmol/L (ref 22–32)
Calcium: 8.6 mg/dL — ABNORMAL LOW (ref 8.9–10.3)
Chloride: 104 mmol/L (ref 98–111)
Creatinine, Ser: 4.19 mg/dL — ABNORMAL HIGH (ref 0.61–1.24)
GFR, Estimated: 17 mL/min — ABNORMAL LOW (ref >60)
Glucose, Bld: 110 mg/dL — ABNORMAL HIGH (ref 70–99)
Potassium: 3.4 mmol/L — ABNORMAL LOW (ref 3.5–5.1)
Sodium: 136 mmol/L (ref 135–145)

## 2022-09-19 LAB — FERRITIN: Ferritin: 36 ng/mL (ref 24–336)

## 2022-09-19 LAB — PHOSPHORUS: Phosphorus: 3.4 mg/dL (ref 2.5–4.6)

## 2022-09-19 LAB — MAGNESIUM: Magnesium: 2.1 mg/dL (ref 1.7–2.4)

## 2022-09-19 MED ORDER — CARVEDILOL 6.25 MG PO TABS
31.2500 mg | ORAL_TABLET | Freq: Two times a day (BID) | ORAL | Status: DC
  Administered 2022-09-19: 31.25 mg via ORAL
  Filled 2022-09-19: qty 1

## 2022-09-19 MED ORDER — CARVEDILOL 25 MG PO TABS
50.0000 mg | ORAL_TABLET | Freq: Two times a day (BID) | ORAL | Status: DC
  Administered 2022-09-19 – 2022-09-20 (×2): 50 mg via ORAL
  Filled 2022-09-19 (×2): qty 2

## 2022-09-19 MED ORDER — POTASSIUM CHLORIDE 20 MEQ PO PACK
40.0000 meq | PACK | Freq: Once | ORAL | Status: AC
  Administered 2022-09-19: 40 meq via ORAL
  Filled 2022-09-19: qty 2

## 2022-09-19 MED ORDER — TORSEMIDE 20 MG PO TABS
40.0000 mg | ORAL_TABLET | Freq: Every day | ORAL | Status: DC
  Administered 2022-09-19 – 2022-09-20 (×2): 40 mg via ORAL
  Filled 2022-09-19 (×2): qty 2

## 2022-09-19 NOTE — Consult Note (Addendum)
Reason for Consult: Chronic kidney disease Referring Physician:  Dr. Darci Current  Chief Complaint: Shortness of breath  47 y.o. male with a past medical history of hypertension, CASHD, DM2, HLD, adrenal mass, CKD3 (back in 2022) who presented to the hospital for weeping in the lower extremities and inability to secure medications.   Assessment/Plan:  CKD IV  Appears that his chronic kidney disease has progressed which is not unexpected given noncompliance and poorly controlled hypertension. Initially serum creatinine decreased with diuresis but now 4.19 at time of consultation. Patient continues to have good urine output. Certainly cardiorenal syndrome can be playing a role in the acute setting but with his advanced kidney disease as well as obvious volume overload he needed the diuresis.  - According to the pt he just got insurance so obtaining the medications shouldn't be an issue anymore. - Restart Torsemide '40mg'$  daily; needs to finally get a floor scale to monitor daily weights and adjust diuretics as needed. Compression stockings to mobilize fluids + elevate legs when sitting. Low Na diet.  -Avoid further nephrotoxins including NSAIDS, Morphine.  Unless absolutely necessary, avoid CT with contrast and/or MRI with gadolinium.    -Continue to monitor daily Cr, Dose meds for GFR<30 -Monitor Daily I/Os, Daily weight   Anemia Will check iron panels to see if we need to load him; no need for ESA at this time.    Renal osteodystrophy Phos 3.4 WNL and no need for binder.  Severe symptomatic hypertension TSH within normal limits previously as well as Aldo/renin ratio within normal limits. -Continue amlodipine, carvedilol and hydralazine   Right adrenal mass Likely nonfunctioning.  Patient was referred to surgery when he was discharged previously from [facility] but never followed up   Multiple sclerosis    HPI: George Day is an 48 y.o. male with a past medical history of hypertension,  CASHD, DM2, HLD, PE, adrenal mass, CKD3 (back in 2022) who presents to the hospital for weeping in the lower extremities and inability to secure medications. He has been off medications for over a week now noted to have an elevated BNP.  He has had increased swelling in the legs which is causing discomfort. He believes he's gaining weight and has been more short of breath with associated orthopnea, decreased exercise tolerance. He denies fever, chills, nausea, chest pain. Patient was diuresed and was neg 2.55L at time of consultation with improvement in BP from 235/127 to 168/90's. Creatinine bumped from 3.97 to 4.19. Hb was 9.   ROS Pertinent items are noted in HPI.  Chemistry and CBC: Creatinine, Ser  Date/Time Value Ref Range Status  09/19/2022 03:32 AM 4.19 (H) 0.61 - 1.24 mg/dL Final  09/18/2022 06:58 AM 3.97 (H) 0.61 - 1.24 mg/dL Final  09/17/2022 07:11 AM 4.10 (H) 0.61 - 1.24 mg/dL Final  08/18/2022 06:56 AM 3.74 (H) 0.61 - 1.24 mg/dL Final  08/17/2022 03:47 AM 4.00 (H) 0.61 - 1.24 mg/dL Final  08/16/2022 06:22 AM 4.46 (H) 0.61 - 1.24 mg/dL Final  02/25/2021 12:50 AM 3.58 (H) 0.61 - 1.24 mg/dL Final  02/24/2021 02:44 AM 3.29 (H) 0.61 - 1.24 mg/dL Final  02/23/2021 03:22 AM 3.43 (H) 0.61 - 1.24 mg/dL Final  02/22/2021 01:47 AM 3.26 (H) 0.61 - 1.24 mg/dL Final  02/21/2021 01:07 PM 3.39 (H) 0.61 - 1.24 mg/dL Final  02/21/2021 12:21 AM 3.33 (H) 0.61 - 1.24 mg/dL Final  02/20/2021 12:07 AM 3.02 (H) 0.61 - 1.24 mg/dL Final  02/19/2021 03:50 AM 3.20 (H) 0.61 -  1.24 mg/dL Final  02/19/2021 03:35 AM 2.93 (H) 0.61 - 1.24 mg/dL Final  02/15/2021 02:58 PM 2.90 (H) 0.40 - 1.50 mg/dL Final  08/11/2020 02:51 AM 2.61 (H) 0.61 - 1.24 mg/dL Final  08/10/2020 08:07 AM 2.27 (H) 0.61 - 1.24 mg/dL Final  08/09/2020 02:42 AM 2.67 (H) 0.61 - 1.24 mg/dL Final  08/08/2020 06:00 AM 2.62 (H) 0.61 - 1.24 mg/dL Final  02/25/2020 03:07 PM 2.16 (H) 0.76 - 1.27 mg/dL Final  01/05/2020 03:15 PM 1.83 (H) 0.76 -  1.27 mg/dL Final   Recent Labs  Lab 09/17/22 0711 09/18/22 0658 09/19/22 0332  NA 138 141 136  K 3.8 3.3* 3.4*  CL 106 106 104  CO2 '22 22 23  '$ GLUCOSE 101* 144* 110*  BUN 41* 36* 38*  CREATININE 4.10* 3.97* 4.19*  CALCIUM 8.4* 8.6* 8.6*  PHOS  --   --  3.4   Recent Labs  Lab 09/17/22 0711 09/18/22 0658 09/19/22 0332  WBC 5.1 6.5 6.0  HGB 9.6* 9.3* 9.0*  HCT 28.8* 27.2* 26.2*  MCV 69.4* 67.8* 67.0*  PLT 278 288 296   Liver Function Tests: No results for input(s): "AST", "ALT", "ALKPHOS", "BILITOT", "PROT", "ALBUMIN" in the last 168 hours. No results for input(s): "LIPASE", "AMYLASE" in the last 168 hours. No results for input(s): "AMMONIA" in the last 168 hours. Cardiac Enzymes: No results for input(s): "CKTOTAL", "CKMB", "CKMBINDEX", "TROPONINI" in the last 168 hours. Iron Studies: No results for input(s): "IRON", "TIBC", "TRANSFERRIN", "FERRITIN" in the last 72 hours. PT/INR: '@LABRCNTIP'$ (inr:5)  Xrays/Other Studies: ) Results for orders placed or performed during the hospital encounter of 09/17/22 (from the past 48 hour(s))  Magnesium     Status: Abnormal   Collection Time: 09/17/22  3:55 PM  Result Value Ref Range   Magnesium 2.5 (H) 1.7 - 2.4 mg/dL    Comment: Performed at [facility], [place] [address], [place], [place] [address]  Brain natriuretic peptide     Status: Abnormal   Collection Time: 09/17/22  3:55 PM  Result Value Ref Range   B Natriuretic Peptide 476.5 (H) 0.0 - 100.0 pg/mL    Comment: Performed at [facility] [address], [place], [place] [address]  Basic metabolic panel     Status: Abnormal   Collection Time: 09/18/22  6:58 AM  Result Value Ref Range   Sodium 141 135 - 145 mmol/L   Potassium 3.3 (L) 3.5 - 5.1 mmol/L   Chloride 106 98 - 111 mmol/L   CO2 22 22 - 32 mmol/L   Glucose, Bld 144 (H) 70 - 99 mg/dL    Comment: Glucose reference range applies only to samples taken after fasting for at least 8 hours.   BUN 36 (H) 6 -  20 mg/dL   Creatinine, Ser 3.97 (H) 0.61 - 1.24 mg/dL   Calcium 8.6 (L) 8.9 - 10.3 mg/dL   GFR, Estimated 18 (L) >60 mL/min    Comment: (NOTE) Calculated using the CKD-EPI Creatinine Equation (2021)    Anion gap 13 5 - 15    Comment: Performed at [facility] [address], [place] [address]  CBC     Status: Abnormal   Collection Time: 09/18/22  6:58 AM  Result Value Ref Range   WBC 6.5 4.0 - 10.5 K/uL   RBC 4.01 (L) 4.22 - 5.81 MIL/uL   Hemoglobin 9.3 (L) 13.0 - 17.0 g/dL   HCT 27.2 (L) 39.0 - 52.0 %  MCV 67.8 (L) 80.0 - 100.0 fL   MCH 23.2 (L) 26.0 - 34.0 pg   MCHC 34.2 30.0 - 36.0 g/dL   RDW 17.4 (H) 11.5 - 15.5 %   Platelets 288 150 - 400 K/uL   nRBC 0.0 0.0 - 0.2 %    Comment: Performed at [facility] [address] [place], [place] [address]  Magnesium     Status: None   Collection Time: 09/18/22  6:58 AM  Result Value Ref Range   Magnesium 2.3 1.7 - 2.4 mg/dL    Comment: Performed at [facility] [address] [place], [place] [address]  Basic metabolic panel     Status: Abnormal   Collection Time: 09/19/22  3:32 AM  Result Value Ref Range   Sodium 136 135 - 145 mmol/L   Potassium 3.4 (L) 3.5 - 5.1 mmol/L   Chloride 104 98 - 111 mmol/L   CO2 23 22 - 32 mmol/L   Glucose, Bld 110 (H) 70 - 99 mg/dL    Comment: Glucose reference range applies only to samples taken after fasting for at least 8 hours.   BUN 38 (H) 6 - 20 mg/dL   Creatinine, Ser 4.19 (H) 0.61 - 1.24 mg/dL   Calcium 8.6 (L) 8.9 - 10.3 mg/dL   GFR, Estimated 17 (L) >60 mL/min    Comment: (NOTE) Calculated using the CKD-EPI Creatinine Equation (2021)    Anion gap 9 5 - 15    Comment: Performed at [facility] [address] [place], [place] [address]  Magnesium     Status: None   Collection Time: 09/19/22  3:32 AM  Result Value Ref Range   Magnesium 2.1 1.7 - 2.4 mg/dL    Comment: Performed at [facility] [address] [place], [place] [address]   CBC     Status: Abnormal   Collection Time: 09/19/22  3:32 AM  Result Value Ref Range   WBC 6.0 4.0 - 10.5 K/uL   RBC 3.91 (L) 4.22 - 5.81 MIL/uL   Hemoglobin 9.0 (L) 13.0 - 17.0 g/dL   HCT 26.2 (L) 39.0 - 52.0 %   MCV 67.0 (L) 80.0 - 100.0 fL   MCH 23.0 (L) 26.0 - 34.0 pg   MCHC 34.4 30.0 - 36.0 g/dL   RDW 17.6 (H) 11.5 - 15.5 %   Platelets 296 150 - 400 K/uL    Comment: REPEATED TO VERIFY   nRBC 0.0 0.0 - 0.2 %    Comment: Performed at [facility], [place] [address], [place], [place] [address]  Phosphorus     Status: None   Collection Time: 09/19/22  3:32 AM  Result Value Ref Range   Phosphorus 3.4 2.5 - 4.6 mg/dL    Comment: Performed at [facility] [address] [place], [place] [address]   ECHOCARDIOGRAM COMPLETE  Result Date: 09/18/2022    ECHOCARDIOGRAM REPORT   Patient Name:   George Day Date of Exam: 09/18/2022 Medical Rec #:  [id number]      Height:       69.0 in Accession #:    [id number]     Weight:       205.0 lb Date of Birth:  [birth year]       BSA:          2.088 m Patient Age:    75 years       BP:  175/98 mmHg Patient Gender: M              HR:           84 bpm. Exam Location:  Inpatient Procedure: 2D Echo, Cardiac Doppler, Color Doppler and Intracardiac            Opacification Agent Indications:    CHF- Acute systolic  History:        Patient has prior history of Echocardiogram examinations, most                 recent 08/09/2020. CAD; Risk Factors:Diabetes, Dyslipidemia and                 Hypertension. CKD.  Sonographer:    Eartha Inch Referring Phys: [id number] [place]  1. Left ventricular ejection fraction, by estimation, is 50 to 55%. The left ventricle has low normal function. The left ventricle has no regional wall motion abnormalities. There is severe concentric left ventricular hypertrophy. Indeterminate diastolic filling due to E-A fusion.  2. Right ventricular systolic function is normal. The right ventricular size is  normal. Tricuspid regurgitation signal is inadequate for assessing PA pressure.  3. Left atrial size was mildly dilated.  4. Moderate circumferential pericardial effusion, up to 1.9 cm posterior to the LV. No signs of tamponade. Moderate pericardial effusion. The pericardial effusion is circumferential. There is no evidence of cardiac tamponade.  5. The mitral valve is grossly normal. Mild mitral valve regurgitation. No evidence of mitral stenosis.  6. The aortic valve is tricuspid. Aortic valve regurgitation is not visualized. No aortic stenosis is present.  7. The inferior vena cava is dilated in size with <50% respiratory variability, suggesting right atrial pressure of 15 mmHg. Comparison(s): Changes from prior study are noted. FINDINGS  Left Ventricle: Left ventricular ejection fraction, by estimation, is 50 to 55%. The left ventricle has low normal function. The left ventricle has no regional wall motion abnormalities. Definity contrast agent was given IV to delineate the left ventricular endocardial borders. The left ventricular internal cavity size was normal in size. There is severe concentric left ventricular hypertrophy. Indeterminate diastolic filling due to E-A fusion. Right Ventricle: The right ventricular size is normal. No increase in right ventricular wall thickness. Right ventricular systolic function is normal. Tricuspid regurgitation signal is inadequate for assessing PA pressure. Left Atrium: Left atrial size was mildly dilated. Right Atrium: Right atrial size was normal in size. Pericardium: Moderate circumferential pericardial effusion, up to 1.9 cm posterior to the LV. No signs of tamponade. A moderately sized pericardial effusion is present. The pericardial effusion is circumferential. There is no evidence of cardiac tamponade. Mitral Valve: The mitral valve is grossly normal. Mild mitral valve regurgitation. No evidence of mitral valve stenosis. Tricuspid Valve: The tricuspid valve is  grossly normal. Tricuspid valve regurgitation is trivial. No evidence of tricuspid stenosis. Aortic Valve: The aortic valve is tricuspid. Aortic valve regurgitation is not visualized. No aortic stenosis is present. Pulmonic Valve: The pulmonic valve was grossly normal. Pulmonic valve regurgitation is trivial. No evidence of pulmonic stenosis. Aorta: The aortic root and ascending aorta are structurally normal, with no evidence of dilitation. Venous: The inferior vena cava is dilated in size with less than 50% respiratory variability, suggesting right atrial pressure of 15 mmHg. IAS/Shunts: The atrial septum is grossly normal.  LEFT VENTRICLE PLAX 2D LVIDd:         3.70 cm   Diastology LVIDs:  2.50 cm   LV e' medial:    8.16 cm/s LV PW:         2.30 cm   LV E/e' medial:  13.1 LV IVS:        1.40 cm   LV e' lateral:   8.81 cm/s LVOT diam:     2.00 cm   LV E/e' lateral: 12.1 LV SV:         82 LV SV Index:   39 LVOT Area:     3.14 cm  RIGHT VENTRICLE             IVC RV S prime:     14.70 cm/s  IVC diam: 2.40 cm TAPSE (M-mode): 1.8 cm LEFT ATRIUM              Index        RIGHT ATRIUM           Index LA diam:        4.00 cm  1.92 cm/m   RA Area:     16.90 cm LA Vol (A2C):   101.0 ml 48.37 ml/m  RA Volume:   43.20 ml  20.69 ml/m LA Vol (A4C):   72.8 ml  34.87 ml/m LA Biplane Vol: 87.2 ml  41.76 ml/m  AORTIC VALVE LVOT Vmax:   156.00 cm/s LVOT Vmean:  115.000 cm/s LVOT VTI:    0.261 m  AORTA Ao Root diam: 2.80 cm Ao Asc diam:  3.00 cm MITRAL VALVE MV Area (PHT): 3.11 cm     SHUNTS MV Decel Time: 244 msec     Systemic VTI:  0.26 m MV E velocity: 106.50 cm/s  Systemic Diam: 2.00 cm Eleonore Chiquito MD Electronically signed by Eleonore Chiquito MD Signature Date/Time: 09/18/2022/3:19:08 PM    Final     PMH:   Past Medical History:  Diagnosis Date   Anemia 1998   Anginal pain ([place])    Anxiety    Asthma    Coronary artery disease    Diabetes mellitus without complication ([place]) 1478   Hypertension 2019   MI  (myocardial infarction) ([place]) 2010   Sleep apnea    Stroke ([place]) 2010    PSH:   Past Surgical History:  Procedure Laterality Date   BIOPSY  08/18/2022   Procedure: BIOPSY;  Surgeon: Sharyn Creamer, MD;  Location: [facility] ENDOSCOPY;  Service: Gastroenterology;;   CARDIAC CATHETERIZATION  2010   COLONOSCOPY WITH PROPOFOL N/A 08/18/2022   Procedure: COLONOSCOPY WITH PROPOFOL;  Surgeon: Sharyn Creamer, MD;  Location: [facility];  Service: Gastroenterology;  Laterality: N/A;   ESOPHAGOGASTRODUODENOSCOPY (EGD) WITH PROPOFOL N/A 08/18/2022   Procedure: ESOPHAGOGASTRODUODENOSCOPY (EGD) WITH PROPOFOL;  Surgeon: Sharyn Creamer, MD;  Location: [facility];  Service: Gastroenterology;  Laterality: N/A;   POLYPECTOMY  08/18/2022   Procedure: POLYPECTOMY;  Surgeon: Sharyn Creamer, MD;  Location: [facility] ENDOSCOPY;  Service: Gastroenterology;;    Allergies:  Allergies  Allergen Reactions   Honey Bee Venom [Bee Venom] Anaphylaxis   Penicillins Anaphylaxis    ALL   Tomato Swelling    Medications:   Prior to Admission medications   Medication Sig Start Date End Date Taking? Authorizing Provider  albuterol (VENTOLIN HFA) 108 (90 Base) MCG/ACT inhaler Inhale 1-2 puffs into the lungs every 2 (two) hours as needed for wheezing or shortness of breath.   Yes [provider]  aspirin EC 81 MG tablet Take 1 tablet (81 mg total) by mouth daily. Swallow whole. 08/29/20  Yes Ladell Pier, MD  atorvastatin (LIPITOR) 40 MG tablet Take 1 tablet (40 mg total) by mouth daily. 08/18/22  Yes Danford, Suann Larry, MD  carvedilol (COREG) 25 MG tablet Take 1 tablet (25 mg total) by mouth 2 (two) times daily with a meal. Take with 6.25 tablet for total 31.5 mg twice daily 08/18/22  Yes Danford, Suann Larry, MD  carvedilol (COREG) 6.25 MG tablet Take 1 tablet (6.25 mg total) by mouth 2 (two) times daily. Take with 25 tablet for total 31.5 mg twice daily 08/18/22  Yes Danford, Suann Larry, MD  docusate sodium  (COLACE) 100 MG capsule Take 100 mg by mouth 2 (two) times daily.   Yes [provider]  famotidine (PEPCID) 20 MG tablet Take 20 mg by mouth daily.   Yes [provider]  ferrous sulfate 325 (65 FE) MG tablet Take 1 tablet (325 mg total) by mouth daily with breakfast. 08/19/22  Yes Danford, Suann Larry, MD  Fluticasone-Umeclidin-Vilant (TRELEGY ELLIPTA) 100-62.5-25 MCG/ACT AEPB Inhale 1 puff into the lungs daily. 08/18/22  Yes Danford, Suann Larry, MD  hydrALAZINE (APRESOLINE) 50 MG tablet Take 1 tablet (50 mg total) by mouth 4 (four) times daily. 08/18/22  Yes Danford, Suann Larry, MD  isosorbide dinitrate (ISORDIL) 20 MG tablet Take 1 tablet (20 mg total) by mouth 3 (three) times daily. 08/18/22  Yes Danford, Suann Larry, MD  LORazepam (ATIVAN) 0.5 MG tablet Take 0.5 mg by mouth 2 (two) times daily. 07/09/22  Yes [provider]  NIFEdipine (PROCARDIA XL/NIFEDICAL XL) 60 MG 24 hr tablet Take 1 tablet (60 mg total) by mouth daily. 08/18/22  Yes Danford, Suann Larry, MD  nitroGLYCERIN (NITROSTAT) 0.6 MG SL tablet Place 0.6 mg under the tongue every 5 (five) minutes as needed for chest pain.   Yes [provider]  pantoprazole (PROTONIX) 40 MG tablet Take 1 tablet (40 mg total) by mouth 2 (two) times daily. 08/18/22  Yes Danford, Suann Larry, MD  torsemide (DEMADEX) 20 MG tablet Take 2 tablets (40 mg total) by mouth daily. 08/18/22  Yes Danford, Suann Larry, MD  Vitamin D, Ergocalciferol, (DRISDOL) 1.25 MG (50000 UNIT) CAPS capsule Take 1 capsule (50,000 Units total) by mouth every 7 (seven) days. 03/03/21  Yes Domenic Polite, MD    Discontinued Meds:   Medications Discontinued During This Encounter  Medication Reason   albuterol (VENTOLIN HFA) 108 (90 Base) MCG/ACT inhaler 1-2 puff P&T Policy: Therapeutic Substitute   carvedilol (COREG) tablet 6.25 mg    carvedilol (COREG) tablet 25 mg    furosemide (LASIX) injection 40 mg    hydrALAZINE  (APRESOLINE) tablet 50 mg    isosorbide dinitrate (ISORDIL) tablet 20 mg    carvedilol (COREG) tablet 25 mg    carvedilol (COREG) tablet 31.25 mg     Social History:  reports that he has quit smoking. His smoking use included cigars, e-cigarettes, and cigarettes. He has a 15.50 pack-year smoking history. He has never used smokeless tobacco. He reports that he does not currently use alcohol. He reports that he does not currently use drugs after having used the following drugs: Marijuana.  Family History:   Family History  Problem Relation Age of Onset   Diabetes Mother    Hyperlipidemia Mother    Diabetes Sister    Hyperlipidemia Sister    Hearing loss Sister    Diabetes Brother    Hyperlipidemia Brother     Blood pressure (!) 168/102, pulse 88, temperature 98.4 F (36.9 C),  temperature source Oral, resp. rate 16, height '5\' 9"'$  (1.753 m), weight 92.3 kg, SpO2 98 %. General: well-appearing, no acute distress HEENT: anicteric sclera, oropharynx clear without lesions CV: regular rate, normal rhythm, no murmurs, no gallops, no rubs Lungs: clear to auscultation bilaterally, normal work of breathing Skin: no visible lesions or rashes Ext: 1+ LE edema b/l Psych: alert, engaged, appropriate mood and affect Musculoskeletal: no obvious deformities Neuro: normal speech, no gross focal deficits         Dwana Melena, MD 09/19/2022, 3:53 PM

## 2022-09-19 NOTE — Progress Notes (Addendum)
   Heart Failure Stewardship Pharmacist Progress Note   PCP: Pcp, No PCP-Cardiologist: Quay Burow, MD    HPI:  [name] with PMH of CHF, HTN, diabetes, COPD, CAD, stroke, former tobacco abuse, and daily marijuana use.  Was a patient of Dr. Gwenlyn Found, last seen in 2022 before moving to [place].  He was admitted in 09/2021 with uncontrolled HTN and during that time was found to have severe LVH, reduced strain with pattern consistent with amyloidosis, small to mod pericardial effusion.   He had follow up with cardiology in [place] while he was there. Planned cMRI and amyloid workup. Per nephrology in [place], the patient's SPEP and UPEP suggest glomerular proteinuria. In addition, although kappa and lambda light chains were elevated, the kappa to lambda ratio was normal. So, amyloidosis is unlikely.   Presented to the ED on 1/29 with LE edema, shortness of breath, and orthopnea. CXR clear.  ECHO 1/30 showed LVEF 50-55%, no regional wall motion abnormalities, RV normal.   Current HF Medications: Beta Blocker: carvedilol 50 mg BID  Prior to admission HF Medications: Diuretic: torsemide 40 mg daily Beta blocker: carvedilol 31.5 mg BID Other: hydralazine 50 mg QID + Isordil 20 mg TID *has been off meds x 1 week  Pertinent Lab Values: Serum creatinine 4.19, BUN 38, Potassium 3.4, Sodium 136, BNP 476.5, Magnesium 2.1   Vital Signs: Weight: 203 lbs (admission weight: 205 lbs) Blood pressure: 160/90s  Heart rate: 80s  I/O: -2.4L yesterday; net -3L  Medication Assistance / Insurance Benefits Check: Does the patient have prescription insurance?  Yes Type of insurance plan: [place] Medicaid  Outpatient Pharmacy:  Prior to admission outpatient pharmacy: [facility] Is the patient willing to use [facility] pharmacy at discharge? Yes    Assessment: 1. Acute on chronic diastolic CHF (LVEF 17-79%). NYHA class II symptoms. - Off IV lasix - Agree with increasing to carvedilol 50 mg BID - GDMT limited by advanced  CKD. eGFR <20. - Continue hydralazine 100 mg TID + Imdur 60 mg daily - consider consolidating to BiDil 2 tabs TID for cost savings   Plan: 1) Medication changes recommended at this time: - Switch to BiDil 2 tabs TID  2) Patient assistance: - Has [place] Medicaid - Can consolidate hydralazine/Imdur to BiDil for cost saving (BiDil $3 but he would need to pay $3 each for hydralazine + Imdur) 3)  Education  - To be completed prior to discharge  Kerby Nora, PharmD, BCPS Heart Failure Stewardship Pharmacist Phone [phone]

## 2022-09-19 NOTE — Progress Notes (Signed)
Heart Failure Nurse Navigator Progress Note  PCP: Pcp, No PCP-Cardiologist: Berry Admission Diagnosis: Anasarca, Hypertensive emergency,  Admitted from: Home  Presentation:   George Day presented with pain and swelling in bilateral lower extremities up to thighs, shortness of breath., uncontrolled hypertension. Patient and family recently moved to [facility] from [place], living in Extended Stay hotel currently, voiced concern over the bugs there, trying to find a new place to stay, CSW/CSM provided resources, on admission BP 235/127, HR 69, BNP 476.5, IV lasix and hydralazine given. CXR clear,   Patient and wife educated on the sing and symptoms of heart failure, daily weights, when to call his doctor or go to the ED, Diet/ fluid restrictions, taking all medications as prescribed and attending all medical appointments. Patient verbalized his understanding. A hospital HF [facility] appointment was scheduled for 10/12/22 @ 9 am, which was the first Friday appointment they could come. Navigator consulted with CSW/CSM to help with permanent housing resources for family, as well as providing a bag of food from the Heart and Vascular Food Portal.   ECHO/ LVEF: 50-55%  Clinical Course:  Past Medical History:  Diagnosis Date   Anemia 1998   Anginal pain ([place])    Anxiety    Asthma    Coronary artery disease    Diabetes mellitus without complication ([place]) 9702   Hypertension 2019   MI (myocardial infarction) ([place]) 2010   Sleep apnea    Stroke [facility]) 2010     Social History   Socioeconomic History   Marital status: Single    Spouse name: Not on file   Number of children: 3   Years of education: Not on file   Highest education level: Not on file  Occupational History   Not on file  Tobacco Use   Smoking status: Former    Packs/day: 0.50    Years: 31.00    Total pack years: 15.50    Types: Cigars, E-cigarettes, Cigarettes   Smokeless tobacco: Never  Vaping Use   Vaping Use: Never used  Substance and  Sexual Activity   Alcohol use: Not Currently    Comment: occasioanlly    Drug use: Not Currently    Types: Marijuana   Sexual activity: Yes    Birth control/protection: None  Other Topics Concern   Not on file  Social History Narrative   Not on file   Social Determinants of Health   Financial Resource Strain: Not on file  Food Insecurity: Food Insecurity Present (09/17/2022)   Hunger Vital Sign    Worried About Running Out of Food in the Last Year: Often true    Ran Out of Food in the Last Year: Often true  Transportation Needs: No Transportation Needs (09/17/2022)   PRAPARE - Hydrologist (Medical): No    Lack of Transportation (Non-Medical): No  Physical Activity: Not on file  Stress: Not on file  Social Connections: Not on file   Education Assessment and Provision:  Detailed education and instructions provided on heart failure disease management including the following:  Signs and symptoms of Heart Failure When to call the physician Importance of daily weights Low sodium diet Fluid restriction Medication management Anticipated future follow-up appointments  Patient education given on each of the above topics.  Patient acknowledges understanding via teach back method and acceptance of all instructions.  Education Materials:  "Living Better With Heart Failure" Booklet, HF zone tool, & Daily Weight Tracker Tool.  Patient has scale at home:  No, Buying one Patient has pill box at home: NA    High Risk Criteria for Readmission and/or Poor Patient Outcomes: Heart failure hospital admissions (last 6 months): 0  No Show rate: 7% Difficult social situation: Yes, living in Extended Stay with wife and 4 kids (bug problem) Demonstrates medication adherence: No, out of meds, before had Ins.  Primary Language: english Literacy level: Reading, writing, and comprehension.   Barriers of Care:   Medication compliance Diet/ fluid/ daily weights Lives  in hotel with family (bug issues)    Considerations/Referrals:   Referral made to Heart Failure Pharmacist Stewardship: Yes Referral made to Heart Failure CSW/NCM TOC: Yes, Housing , food,  Referral made to Heart & Vascular TOC clinic: Yes, 10/12/22 ( 1st appt they can come on)  Items for Follow-up on DC/TOC: Medication compliance Diet/ fluid/ daily weights Lives in hotel with wife, 4 kids ( bug problem) CSW/CSM aware, gave food bag   Earnestine Leys, BSN, RN Heart Failure Transport planner Only

## 2022-09-19 NOTE — Progress Notes (Addendum)
Daily Progress Note Intern Pager: [phone]  Patient name: George Day Medical record number: [id number] Date of birth: Jan 04, 1976 Age: 47 y.o. Gender: male  Primary Care Provider: Pcp, No Consultants: None  Code Status: Full code   Pt Overview and Major Events to Date:  1/29: admitted   Assessment and Plan:  George Day is a 47 y.o. male with PMHx of CKD stage IV following nephrology, adrenal mass, anemia, accelerated hypertension, housing insecurity, coronary artery disease, DM2, HLD, asthma presenting with volume overload.   Continue diuresis with 80 mg IV Lasix yesterday resulted in 2 and half liter output.  Unclear what patient's dry weight is as most of his medical care was done in [place].  Clinically patient appears to be euvolemic his peripheral edema has resolved and he is breathing back to baseline.  He reports feeling much better.  Creatinine has also had a slight bump indicating we may be close to dry weight.  Will transition to oral diuresis.  Echo showing normal LVEF but severe concentric left ventricle hypertrophy.  In [place] he was undergoing workup for amyloidosis.  Discussed with the patient today about pursuing inpatient workup.  He reports having good outpatient follow-up with [employer] [employer] and reliable transportation. He would rather pursue outpatient management and workup.  * Hypervolemia Edema in LE improving. Likely secondary to CKD stage IV and noncompliance to medications. Echo LVEF 50-55% w/ severe concentric hypertrophy LV. Last documented weight 92.3 kg, weight on admission 93.1 kg. Cr 4.10 > 3.97 > 4.19. Output 2.5 L s/p 80 mg Lasix IV. - daily CBC/BMP/Mag - PT/OT evaluate and treat - strict ins and outs - start home torsemide 40 mg daily  - daily weights  Accelerated hypertension BP better with SBP 160-170s - cont hydral to 100 mg TID  - cont Procardia 60 mg daily  - increase Coreg 31.25 mg BID   Hypokalemia K 3.4 - ordered 40 mEq Klor 1/31 -  recheck BMP this afternoon, while continuing diuresis   CKD (chronic kidney disease) stage 4, GFR 15-29 ml/min (HCC) Cr uptrending. AKI on CKD in setting of severe uncontrolled HTN with medication noncompliance. - Strict ins and outs - daily BMP - Avoid nephrotoxic medications as able - Control BP  Homelessness Difficulty obtaining medications and has housing until 10/02/21 but has been living in their car intermittently. -- TOC consulted    FEN/GI: regular diet PPx: Lovenox  Dispo:Home pending clinical improvement . Barriers include ongoing medical management for blood pressure and diuresis.   Subjective:  NAEO, patient would like to go home this afternoon.   Objective: Temp:  [98.2 F (36.8 C)-98.5 F (36.9 C)] 98.4 F (36.9 C) (01/31 0803) Pulse Rate:  [88-99] 88 (01/31 0803) Resp:  [14-18] 16 (01/31 0517) BP: (137-168)/(89-102) 168/102 (01/31 0803) SpO2:  [100 %] 100 % (01/31 0803) Weight:  [92.3 kg] 92.3 kg (01/31 0500) Physical Exam: Well-appearing, no acute distress Cardio: Regular rate, regular rhythm, no murmurs on exam. Pulm: Clear, no wheezing, no crackles. No increased work of breathing Abdominal: bowel sounds present, soft, non-tender, non-distended Extremities: no peripheral edema  Neuro: alert and oriented x3, speech normal in content, no facial asymmetry, strength intact and equal bilaterally in UE and LE, pupils equal and reactive to light.    Laboratory: Most recent CBC Lab Results  Component Value Date   WBC 6.0 09/19/2022   HGB 9.0 (L) 09/19/2022   HCT 26.2 (L) 09/19/2022   MCV 67.0 (L) 09/19/2022  PLT 296 09/19/2022   Most recent BMP    Latest Ref Rng & Units 09/19/2022    3:32 AM  BMP  Glucose 70 - 99 mg/dL 110   BUN 6 - 20 mg/dL 38   Creatinine 0.61 - 1.24 mg/dL 4.19   Sodium 135 - 145 mmol/L 136   Potassium 3.5 - 5.1 mmol/L 3.4   Chloride 98 - 111 mmol/L 104   CO2 22 - 32 mmol/L 23   Calcium 8.9 - 10.3 mg/dL 8.6    Darci Current,  DO 09/19/2022, 9:04 AM  PGY-1, [place] Intern pager: [phone], text pages welcome Secure chat group [place]

## 2022-09-19 NOTE — Hospital Course (Addendum)
George Day is a 47 y.o. male with PMHx of CKD stage IV following nephrology, adrenal mass, anemia, accelerated hypertension, housing insecurity, coronary artery disease, DM2, HLD, asthma presenting with volume overload. His hospital course is outlined below:   Hypervolemia 2/2 CKD4 and Medication Noncompliance  Presented with worsening lower extremity edema with weeping for several days with worsening orthopnea.  He been unable to obtain his medications due to housing insecurity and no medical insurance.  In the ED he received 40 mg Lasix, hydralazine for uncontrolled blood pressure and potassium replacement.  He received further workup with an echo showing LVEF 50-55% and severe concentric left ventricular hypertrophy.  He is weight on admission was 93.1 kg.  He continued to be diuresed putting out approximately 2.5 L.  His weight at discharge was 92.3 kg***.  At discharge he was placed back on his home torsemide 40 mg daily and instructed to follow-up with nephrology outpatient.  Hypertension:  Blood pressure on admission 235/127. He was restarted on his home hydralazine, Coreg, nifedipine.  His blood pressure continued to be elevated his hydralazine was increased to 100 mg 3 times daily.  At discharge his blood pressure was more controlled with systolics in the 696E.  Medications at discharge include: Coreg 50 mg twice daily, nifedipine XL 60 mg daily, hydralazine 100 mg 3 times daily.  CKD4:  Creatinine on admission 4.10. Nephrology was consulted inpatient to evaluate for dialysis.  During this admission they did not recommend pursuing HD catheter placement as he is stable for discharge back to his dry weight.  His creatinine at discharge was 4.19.   PCP Follow Up:  Recheck BMP for creatinine and potassium.  Follow-up iron panel. Check blood pressure, hydralazine increased to 100 mg TID and Coreg increased to 50 mg twice daily. Consider starting clonidine outpatient.  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  Follow-up with cardiology for severe concentric left ventricular hypertrophy.  Follow up with urology for adrenal mass.  Follow up with nephrology for CKD4 progressing to CKD 5.  Most likely he is close to needing access placement.

## 2022-09-19 NOTE — Discharge Instructions (Addendum)
Dear George Day,  Thank you for letting us participate in your care. You were hospitalized for severe swelling and difficulty breathing and diagnosed with Hypervolemia which means increased fluid most likely from your worsening Kidney Function. You were treated with diuretics.  We also worked on your blood pressure and changed your medication.  You should refer to your medication list for any changes.  POST-HOSPITAL & CARE INSTRUCTIONS Please take your blood pressure medications.  You should take your nifedipine once daily, your Coreg twice daily and your hydralazine 3 times daily. Please continue taking your torsemide, this is to prevent swelling in your legs and difficulty breathing. Please get a floor scale to measure your daily weights Please follow-up with your primary care physician and specialist to continue workup.  Please call cardiology to set up an appointment outpatient, let them know that you now have [place] Medicaid. Go to your follow up appointments (listed below)   DOCTOR'S APPOINTMENT   Future Appointments  Date Time Provider [place]  11/01/2022  1:50 PM Kerin Perna, NP [facility] None   Urology Appointment at [facility]:  09/27/2022 at 2:30 pm  [place] [address] [place], [place] [phone]  Follow-up [place] Follow up on 09/27/2022.   Why: 2:30 PM Contact information: [place], [facility]. Go on 10/08/2022.   Why: Please go to your nephrology appointment with [facility] on October 08, 2022 at 2:30 PM Contact information: [place] [address] [phone]                 Take care and be well!  [facility]  [place], [place] [address] [phone]

## 2022-09-20 ENCOUNTER — Ambulatory Visit (INDEPENDENT_AMBULATORY_CARE_PROVIDER_SITE_OTHER): Payer: Medicaid Other | Admitting: Primary Care

## 2022-09-20 ENCOUNTER — Inpatient Hospital Stay (INDEPENDENT_AMBULATORY_CARE_PROVIDER_SITE_OTHER): Payer: Medicaid Other | Admitting: Primary Care

## 2022-09-20 ENCOUNTER — Other Ambulatory Visit (HOSPITAL_COMMUNITY): Payer: Self-pay

## 2022-09-20 ENCOUNTER — Encounter (HOSPITAL_COMMUNITY): Payer: Self-pay | Admitting: Family Medicine

## 2022-09-20 ENCOUNTER — Other Ambulatory Visit: Payer: Self-pay

## 2022-09-20 DIAGNOSIS — D509 Iron deficiency anemia, unspecified: Secondary | ICD-10-CM

## 2022-09-20 DIAGNOSIS — N184 Chronic kidney disease, stage 4 (severe): Secondary | ICD-10-CM

## 2022-09-20 DIAGNOSIS — I160 Hypertensive urgency: Secondary | ICD-10-CM | POA: Diagnosis not present

## 2022-09-20 LAB — RENAL FUNCTION PANEL
Albumin: 2.9 g/dL — ABNORMAL LOW (ref 3.5–5.0)
Anion gap: 9 (ref 5–15)
BUN: 36 mg/dL — ABNORMAL HIGH (ref 6–20)
CO2: 22 mmol/L (ref 22–32)
Calcium: 8.5 mg/dL — ABNORMAL LOW (ref 8.9–10.3)
Chloride: 103 mmol/L (ref 98–111)
Creatinine, Ser: 4.47 mg/dL — ABNORMAL HIGH (ref 0.61–1.24)
GFR, Estimated: 16 mL/min — ABNORMAL LOW (ref >60)
Glucose, Bld: 124 mg/dL — ABNORMAL HIGH (ref 70–99)
Phosphorus: 3.4 mg/dL (ref 2.5–4.6)
Potassium: 3.5 mmol/L (ref 3.5–5.1)
Sodium: 134 mmol/L — ABNORMAL LOW (ref 135–145)

## 2022-09-20 LAB — CBC
HCT: 25.9 % — ABNORMAL LOW (ref 39.0–52.0)
Hemoglobin: 8.8 g/dL — ABNORMAL LOW (ref 13.0–17.0)
MCH: 22.7 pg — ABNORMAL LOW (ref 26.0–34.0)
MCHC: 34 g/dL (ref 30.0–36.0)
MCV: 66.9 fL — ABNORMAL LOW (ref 80.0–100.0)
Platelets: 280 10*3/uL (ref 150–400)
RBC: 3.87 MIL/uL — ABNORMAL LOW (ref 4.22–5.81)
RDW: 17.5 % — ABNORMAL HIGH (ref 11.5–15.5)
WBC: 6.5 10*3/uL (ref 4.0–10.5)
nRBC: 0 % (ref 0.0–0.2)

## 2022-09-20 LAB — PARATHYROID HORMONE, INTACT (NO CA): PTH: 119 pg/mL — ABNORMAL HIGH (ref 15–65)

## 2022-09-20 MED ORDER — SODIUM CHLORIDE 0.9 % IV SOLN
510.0000 mg | Freq: Once | INTRAVENOUS | Status: DC
  Filled 2022-09-20: qty 17

## 2022-09-20 MED ORDER — POTASSIUM CHLORIDE CRYS ER 20 MEQ PO TBCR
20.0000 meq | EXTENDED_RELEASE_TABLET | Freq: Once | ORAL | Status: AC
  Administered 2022-09-20: 20 meq via ORAL
  Filled 2022-09-20: qty 1

## 2022-09-20 MED ORDER — ISOSORB DINITRATE-HYDRALAZINE 20-37.5 MG PO TABS
2.0000 | ORAL_TABLET | Freq: Three times a day (TID) | ORAL | 0 refills | Status: AC
  Filled 2022-09-20: qty 180, 30d supply, fill #0

## 2022-09-20 MED ORDER — ATORVASTATIN CALCIUM 40 MG PO TABS
40.0000 mg | ORAL_TABLET | Freq: Every day | ORAL | 0 refills | Status: DC
  Filled 2022-09-20: qty 30, 30d supply, fill #0

## 2022-09-20 MED ORDER — ASPIRIN 81 MG PO TBEC
81.0000 mg | DELAYED_RELEASE_TABLET | Freq: Every day | ORAL | 0 refills | Status: DC
  Filled 2022-09-20: qty 30, 30d supply, fill #0

## 2022-09-20 MED ORDER — TRELEGY ELLIPTA 100-62.5-25 MCG/ACT IN AEPB
1.0000 | INHALATION_SPRAY | Freq: Every day | RESPIRATORY_TRACT | 0 refills | Status: DC
  Filled 2022-09-20: qty 60, 30d supply, fill #0

## 2022-09-20 MED ORDER — TORSEMIDE 20 MG PO TABS
40.0000 mg | ORAL_TABLET | Freq: Every day | ORAL | 0 refills | Status: DC
  Filled 2022-09-20: qty 60, 30d supply, fill #0

## 2022-09-20 MED ORDER — ALBUTEROL SULFATE HFA 108 (90 BASE) MCG/ACT IN AERS
1.0000 | INHALATION_SPRAY | RESPIRATORY_TRACT | 0 refills | Status: DC | PRN
  Filled 2022-09-20: qty 18, 16d supply, fill #0

## 2022-09-20 MED ORDER — CARVEDILOL 25 MG PO TABS
50.0000 mg | ORAL_TABLET | Freq: Two times a day (BID) | ORAL | 0 refills | Status: DC
  Filled 2022-09-20: qty 120, 30d supply, fill #0

## 2022-09-20 MED ORDER — NIFEDIPINE ER 90 MG PO TB24
90.0000 mg | ORAL_TABLET | Freq: Every day | ORAL | 0 refills | Status: DC
  Filled 2022-09-20: qty 30, 30d supply, fill #0

## 2022-09-20 NOTE — Discharge Summary (Addendum)
[facility] Discharge Summary  Patient name: George Day Medical record number: [id number] Date of birth: June 19, 1976 Age: 47 y.o. Gender: male Date of Admission: 09/17/2022  Date of Discharge: 09/20/2022 Admitting Physician: Dickie La, MD  Primary Care Provider: Pcp, No Consultants: Nephrology, Heart Failure  Indication for Hospitalization: Volume Overload   Brief Hospital Course:  George Day is a 47 y.o. male with PMHx of CKD stage IV following nephrology, adrenal mass, anemia, accelerated hypertension, housing insecurity, coronary artery disease, DM2, HLD, asthma presenting with volume overload. His hospital course is outlined below:   Hypervolemia 2/2 CKD4 and Medication Noncompliance  Presented with worsening lower extremity edema with weeping for several days with worsening orthopnea.  He been unable to obtain his medications due to housing insecurity and no medical insurance.  In the ED he received 40 mg Lasix, hydralazine for uncontrolled blood pressure and potassium replacement.  He received further workup with an echo showing LVEF 50-55% and severe concentric left ventricular hypertrophy.  He is weight on admission was 93.1 kg.  He continued to be diuresed putting out approximately 2.5 L.  His weight at discharge was 89.3 kg.  At discharge he was placed back on his home torsemide 40 mg daily and instructed to follow-up with nephrology outpatient.  Hypertension:  Blood pressure on admission 235/127. He was restarted on his home hydralazine, Coreg, nifedipine.  His blood pressure continued to be elevated his hydralazine was increased to 100 mg 3 times daily.  Blood pressure was under control with isosorbide, hydralazine, Procardia and Coreg.  At discharge his blood pressure was more controlled with systolics in the low 676P.  Medications at discharge include: BiDil 20-37.5 mg TID, Coreg 50 mg BID, procardia 90 mg daily.  CKD4:  Creatinine on  admission 4.10. Nephrology was consulted inpatient to evaluate for dialysis.  During this admission they did not recommend pursuing HD catheter placement as he is stable for discharge back to his dry weight.  His creatinine at discharge was 4.47.  He will follow-up outpatient.  PCP Follow Up:  Recheck BMP for creatinine and potassium.  Follow-up iron panel. Check blood pressure, hydralazine increased to 100 mg TID and Coreg increased to 50 mg twice daily. Consider starting clonidine outpatient.  Continue workup for cardiac amyloidosis, started at previous cardiologist in [place].  Follow-up with cardiology for severe concentric left ventricular hypertrophy.  Follow up with urology for adrenal mass.  Follow up with nephrology for CKD4 progressing to CKD 5.  Most likely he is close to needing access placement. Appt with [facility] on Feb 19 at 2:30 pm.  Discharge Diagnoses/Problem List:  * Hypervolemia Accelerated hypertension Hypokalemia CKD (chronic kidney disease) stage 4, GFR 15-29 ml/min (HCC) Homelessness  Disposition: Home  Discharge Condition: stable  Discharge Exam:  Per progress note on 2/1  Significant Procedures:  None  Significant Labs and Imaging:  Recent Labs  Lab 09/19/22 0332 09/20/22 0001  WBC 6.0 6.5  HGB 9.0* 8.8*  HCT 26.2* 25.9*  PLT 296 280   Recent Labs  Lab 09/19/22 0332 09/20/22 0001  NA 136 134*  K 3.4* 3.5  CL 104 103  CO2 23 22  GLUCOSE 110* 124*  BUN 38* 36*  CREATININE 4.19* 4.47*  CALCIUM 8.6* 8.5*  MG 2.1  --   PHOS 3.4 3.4  ALBUMIN  --  2.9*   Results/Tests Pending at Time of Discharge: None  Discharge Medications:  Allergies as of 09/20/2022  Reactions   Honey Bee Venom [bee Venom] Anaphylaxis   Penicillins Anaphylaxis   ALL   Tomato Swelling        Medication List     STOP taking these medications    hydrALAZINE 50 MG tablet Commonly known as: APRESOLINE   isosorbide dinitrate 20 MG tablet Commonly known  as: ISORDIL   LORazepam 0.5 MG tablet Commonly known as: ATIVAN       TAKE these medications    albuterol 108 (90 Base) MCG/ACT inhaler Commonly known as: VENTOLIN HFA Inhale 1-2 puffs into the lungs every 2 (two) hours as needed for wheezing or shortness of breath.   aspirin EC 81 MG tablet Take 1 tablet (81 mg total) by mouth daily. What changed: additional instructions   atorvastatin 40 MG tablet Commonly known as: LIPITOR Take 1 tablet (40 mg total) by mouth daily.   carvedilol 25 MG tablet Commonly known as: COREG Take 2 tablets (50 mg total) by mouth 2 (two) times daily. What changed:  how much to take when to take this additional instructions Another medication with the same name was removed. Continue taking this medication, and follow the directions you see here.   docusate sodium 100 MG capsule Commonly known as: COLACE Take 100 mg by mouth 2 (two) times daily.   famotidine 20 MG tablet Commonly known as: PEPCID Take 20 mg by mouth daily.   ferrous sulfate 325 (65 FE) MG tablet Take 1 tablet (325 mg total) by mouth daily with breakfast.   isosorbide-hydrALAZINE 20-37.5 MG tablet Commonly known as: BiDil Take 2 tablets by mouth 3 (three) times daily.   NIFEdipine 90 MG 24 hr tablet Commonly known as: PROCARDIA XL/NIFEDICAL-XL Take 1 tablet (90 mg total) by mouth daily. What changed:  medication strength how much to take   nitroGLYCERIN 0.6 MG SL tablet Commonly known as: NITROSTAT Place 0.6 mg under the tongue every 5 (five) minutes as needed for chest pain.   pantoprazole 40 MG tablet Commonly known as: PROTONIX Take 1 tablet (40 mg total) by mouth 2 (two) times daily.   torsemide 20 MG tablet Commonly known as: DEMADEX Take 2 tablets (40 mg total) by mouth daily.   Trelegy Ellipta 100-62.5-25 MCG/ACT Aepb Generic drug: Fluticasone-Umeclidin-Vilant Inhale 1 puff into the lungs daily.   Vitamin D (Ergocalciferol) 1.25 MG (50000 UNIT) Caps  capsule Commonly known as: DRISDOL Take 1 capsule (50,000 Units total) by mouth every 7 (seven) days.       Discharge Instructions: Please refer to Patient Instructions section of EMR for full details.  Patient was counseled important signs and symptoms that should prompt return to medical care, changes in medications, dietary instructions, activity restrictions, and follow up appointments.   Follow-Up Appointments:  Follow-up Information     [facility] Follow up on 09/27/2022.   Why: 2:30 PM Contact information: [place], [facility]. Go on 10/08/2022.   Why: Please go to your nephrology appointment with [facility] on October 08, 2022 at 2:30 PM Contact information: [place] [address] [address]. Go in 22 day(s).   Specialty: Cardiology Why: Hospital follow up 10/12/2022 PLEASE bring a current medication list to appointment FREE valet parking, Entrance C, off [employer] information: [address] [phone] [place]  [place]oun, [place], DO 09/20/2022, 11:09 AM PGY-1, [facility]  Upper Level Addendum:  I agree with Dr. Nelda Bucks and reviewed the above note, making necessary revisions as appropriate.  I agree with the medical decision making and physical exam as noted above.  Gerrit Heck, MD PGY-2 [facility]

## 2022-09-20 NOTE — Progress Notes (Addendum)
     Daily Progress Note Intern Pager: [phone]  Patient name: George Day Medical record number: [id number] Date of birth: 14-May-1976 Age: 47 y.o. Gender: male  Primary Care Provider: Pcp, No Consultants: Nephrology Code Status: Full code  Pt Overview and Major Events to Date:  1/29: admitted  Assessment and Plan: George Day is a 47 y.o. male with PMHx of CKD stage IV following nephrology, adrenal mass, anemia, accelerated hypertension, housing insecurity, coronary artery disease, DM2, HLD, asthma presenting with volume overload.   * Hypervolemia Edema in LE improving. Likely secondary to CKD stage IV and noncompliance to medications. Cr 4.10 > 3.97 > 4.19>4.47. Net negative 4.5 L since admission.  Now on oral torsemide. - daily CBC/BMP/Mag - renal diet t - strict ins and outs - start home torsemide 40 mg daily  - daily weights - consider additional Jardiance 10 mg daily (co-pay $3)  Accelerated hypertension Most recent blood pressure of 156/98.  Significant improvement from admission BP of systolics > 096 and diastolics > 283.  Coreg was increased to 50 mg twice daily yesterday.  Will consolidate isosorbide and hydralazine to BiDil today at discharge, still have room for improvement will increase Procardia to 90 mg daily at discharge. - Start BiDil 20-37 mg TID -Procardia 90 mg daily at discharge -Cont Coreg 50 mg BID  Hypokalemia K 3.5 - ordered 20 mEq Klor 2/1  CKD (chronic kidney disease) stage 4, GFR 15-29 ml/min (HCC) Cr uptrending. AKI on CKD in setting of severe uncontrolled HTN with medication noncompliance.  No need for phosphate binder per nephrology.  No need to start ESA at this time.  Outpatient nephrology follow-up. - Strict ins and outs - daily RFP - Avoid nephrotoxic medications as able  Homelessness Difficulty obtaining medications and has housing until 10/02/21 but has been living in their car intermittently. - Pending TOC consult - Ask about  medication co-pays before prescribing    FEN/GI: Renal diet PPx: Lovenox Dispo:Home today.  Subjective:  Patient would like to go home today.  Reports feeling much better.  Objective: Temp:  [98.4 F (36.9 C)-99.4 F (37.4 C)] 99 F (37.2 C) (02/01 0828) Pulse Rate:  [84-88] 85 (02/01 0828) Resp:  [18] 18 (02/01 0328) BP: (144-156)/(85-98) 156/98 (02/01 0828) SpO2:  [95 %-100 %] 100 % (02/01 0830) Weight:  [89.3 kg] 89.3 kg (02/01 0830) Physical Exam: General: NAD, pleasant, sitting up in bed and eating breakfast Cardiovascular: RRR, no MRG Respiratory: CTAB, normal breathing on room air Extremities: No peripheral edema  Laboratory: Most recent CBC Lab Results  Component Value Date   WBC 6.5 09/20/2022   HGB 8.8 (L) 09/20/2022   HCT 25.9 (L) 09/20/2022   MCV 66.9 (L) 09/20/2022   PLT 280 09/20/2022   Most recent BMP    Latest Ref Rng & Units 09/20/2022   12:01 AM  BMP  Glucose 70 - 99 mg/dL 124   BUN 6 - 20 mg/dL 36   Creatinine 0.61 - 1.24 mg/dL 4.47   Sodium 135 - 145 mmol/L 134   Potassium 3.5 - 5.1 mmol/L 3.5   Chloride 98 - 111 mmol/L 103   CO2 22 - 32 mmol/L 22   Calcium 8.9 - 10.3 mg/dL 8.5    George Dales, DO 09/20/2022, 10:52 AM  PGY-1, [place] Intern pager: [phone], text pages welcome Secure chat group [place]

## 2022-09-20 NOTE — Progress Notes (Signed)
   Heart Failure Stewardship Pharmacist Progress Note   PCP: Pcp, No PCP-Cardiologist: Quay Burow, MD    HPI:  [name] with PMH of CHF, HTN, diabetes, COPD, CAD, stroke, former tobacco abuse, and daily marijuana use.  Was a patient of Dr. Gwenlyn Found, last seen in 2022 before moving to [place].  He was admitted in 09/2021 with uncontrolled HTN and during that time was found to have severe LVH, reduced strain with pattern consistent with amyloidosis, small to mod pericardial effusion.   He had follow up with cardiology in [place] while he was there. Planned cMRI and amyloid workup. Per nephrology in [place], the patient's SPEP and UPEP suggest glomerular proteinuria. In addition, although kappa and lambda light chains were elevated, the kappa to lambda ratio was normal. So, amyloidosis is unlikely.   Presented to the ED on 1/29 with LE edema, shortness of breath, and orthopnea. CXR clear.  ECHO 1/30 showed LVEF 50-55%, no regional wall motion abnormalities, RV normal.   Current HF Medications: Diuretic: torsemide 40 mg daily Beta Blocker: carvedilol 50 mg BID Other: hydralazine 100 mg TID + Imdur 60 mg daily  Prior to admission HF Medications: Diuretic: torsemide 40 mg daily Beta blocker: carvedilol 31.5 mg BID Other: hydralazine 50 mg QID + Isordil 20 mg TID *has been off meds x 1 week  Pertinent Lab Values: Serum creatinine 4.47, BUN 36, Potassium 3.5, Sodium 134, BNP 476.5, Magnesium 2.1   Vital Signs: Weight: 196 lbs (admission weight: 205 lbs) Blood pressure: 140-160/90s  Heart rate: 80s  I/O: -2.2L yesterday; net -4.6L  Medication Assistance / Insurance Benefits Check: Does the patient have prescription insurance?  Yes Type of insurance plan: [place] [employer]  Outpatient Pharmacy:  Prior to admission outpatient pharmacy: [facility] Is the patient willing to use [facility] at discharge? Yes    Assessment: 1. Acute on chronic diastolic CHF (LVEF 29-47%). NYHA class II symptoms. -  Continue torsemide 40 mg daily - Continue carvedilol 50 mg BID - GDMT limited by advanced CKD. eGFR <20. - Continue hydralazine 100 mg TID + Imdur 60 mg daily - consider consolidating to BiDil 2 tabs TID for cost savings   Plan: 1) Medication changes recommended at this time: - Switch to BiDil 2 tabs TID  2) Patient assistance: - Has  Medicaid - Can consolidate hydralazine/Imdur to BiDil for cost saving (BiDil $3 but he would need to pay $3 each for hydralazine + Imdur)  3)  Education  - Patient has been educated on current HF medications and potential additions to HF medication regimen - Patient verbalizes understanding that over the next few months, these medication doses may change and more medications may be added to optimize HF regimen - Patient has been educated on basic disease state pathophysiology and goals of therapy   Kerby Nora, PharmD, BCPS Heart Failure Stewardship Pharmacist Phone [phone]

## 2022-09-20 NOTE — Progress Notes (Signed)
[facility] Progress Note   47 y.o. male with a past medical history of hypertension, CASHD, DM2, HLD, PE, adrenal mass, CKD3 (back in 2022) who presents to the hospital for weeping in the lower extremities and inability to secure medications. He has been off medications for over a week now noted to have an elevated BNP.  He has had increased swelling in the legs which is causing discomfort. He believes he's gaining weight and has been more short of breath with associated orthopnea, decreased exercise tolerance. He denies fever, chills, nausea, chest pain. Patient was diuresed and was neg 2.55L at time of consultation with improvement in BP from 235/127 to 168/90's. Creatinine bumped from 3.97 to 4.19. Hb was 9.   Assessment/ Plan:   CKD IV  Appears that his chronic kidney disease has progressed which is not unexpected given noncompliance and poorly controlled hypertension. Initially serum creatinine decreased with diuresis but now 4.19 at time of consultation. Patient continues to have good urine output. Certainly cardiorenal syndrome can be playing a role in the acute setting but with his advanced kidney disease as well as obvious volume overload he needed the diuresis.  - According to the pt he just got insurance so obtaining the medications shouldn't be an issue anymore. Also we just issued an appt for 2/19 @ 230PM. I also let the pt know.  - Restart Torsemide '40mg'$  daily; needs to finally get a floor scale to monitor daily weights and adjust diuretics as needed. Compression stockings to mobilize fluids + elevate legs when sitting. Low Na diet.  Feraheme '510mg'$  before he leaves.   -Avoid further nephrotoxins including NSAIDS, Morphine.  Unless absolutely necessary, avoid CT with contrast and/or MRI with gadolinium.    -Continue to monitor daily Cr, Dose meds for GFR<30 -Monitor Daily I/Os, Daily weight    Anemia Will check iron panels to see if we need to load him; no need for  ESA at this time.    Renal osteodystrophy Phos 3.4 WNL and no need for binder.   Severe symptomatic hypertension TSH within normal limits previously as well as Aldo/renin ratio within normal limits. -Continue amlodipine, carvedilol and hydralazine   Right adrenal mass Likely nonfunctioning.  Patient was referred to surgery when he was discharged previously from [facility] but never followed up   Multiple sclerosis  Subjective:   Feels much better; denies f/c/n/v/sob   Objective:   BP (!) 156/98   Pulse 85   Temp 99 F (37.2 C) (Oral)   Resp 18   Ht '5\' 9"'$  (1.753 m)   Wt 89.3 kg   SpO2 100%   BMI 29.06 kg/m   Intake/Output Summary (Last 24 hours) at 09/20/2022 1139 Last data filed at 09/19/2022 2254 Gross per 24 hour  Intake 240 ml  Output 2040 ml  Net -1800 ml   Weight change:   Physical Exam: GEN: NAD, A&Ox3, NCAT HEENT: No conjunctival pallor, EOMI NECK: Supple, no thyromegaly LUNGS: CTA B/L no rales, rhonchi or wheezing CV: RRR, No M/R/G ABD: SNDNT +BS  EXT: Tr extremity edema   Imaging: ECHOCARDIOGRAM COMPLETE  Result Date: 09/18/2022    ECHOCARDIOGRAM REPORT   Patient Name:   PANCHO RUSHING Date of Exam: 09/18/2022 Medical Rec #:  [id number]      Height:       69.0 in Accession #:    [id number]     Weight:       205.0 lb Date of Birth:  09/30/1975  BSA:          2.088 m Patient Age:    47 years       BP:           175/98 mmHg Patient Gender: M              HR:           84 bpm. Exam Location:  Inpatient Procedure: 2D Echo, Cardiac Doppler, Color Doppler and Intracardiac            Opacification Agent Indications:    CHF- Acute systolic  History:        Patient has prior history of Echocardiogram examinations, most                 recent 08/09/2020. CAD; Risk Factors:Diabetes, Dyslipidemia and                 Hypertension. CKD.  Sonographer:    Eartha Inch Referring Phys: [id number] [place]  1. Left ventricular ejection fraction, by estimation, is 50  to 55%. The left ventricle has low normal function. The left ventricle has no regional wall motion abnormalities. There is severe concentric left ventricular hypertrophy. Indeterminate diastolic filling due to E-A fusion.  2. Right ventricular systolic function is normal. The right ventricular size is normal. Tricuspid regurgitation signal is inadequate for assessing PA pressure.  3. Left atrial size was mildly dilated.  4. Moderate circumferential pericardial effusion, up to 1.9 cm posterior to the LV. No signs of tamponade. Moderate pericardial effusion. The pericardial effusion is circumferential. There is no evidence of cardiac tamponade.  5. The mitral valve is grossly normal. Mild mitral valve regurgitation. No evidence of mitral stenosis.  6. The aortic valve is tricuspid. Aortic valve regurgitation is not visualized. No aortic stenosis is present.  7. The inferior vena cava is dilated in size with <50% respiratory variability, suggesting right atrial pressure of 15 mmHg. Comparison(s): Changes from prior study are noted. FINDINGS  Left Ventricle: Left ventricular ejection fraction, by estimation, is 50 to 55%. The left ventricle has low normal function. The left ventricle has no regional wall motion abnormalities. Definity contrast agent was given IV to delineate the left ventricular endocardial borders. The left ventricular internal cavity size was normal in size. There is severe concentric left ventricular hypertrophy. Indeterminate diastolic filling due to E-A fusion. Right Ventricle: The right ventricular size is normal. No increase in right ventricular wall thickness. Right ventricular systolic function is normal. Tricuspid regurgitation signal is inadequate for assessing PA pressure. Left Atrium: Left atrial size was mildly dilated. Right Atrium: Right atrial size was normal in size. Pericardium: Moderate circumferential pericardial effusion, up to 1.9 cm posterior to the LV. No signs of tamponade. A  moderately sized pericardial effusion is present. The pericardial effusion is circumferential. There is no evidence of cardiac tamponade. Mitral Valve: The mitral valve is grossly normal. Mild mitral valve regurgitation. No evidence of mitral valve stenosis. Tricuspid Valve: The tricuspid valve is grossly normal. Tricuspid valve regurgitation is trivial. No evidence of tricuspid stenosis. Aortic Valve: The aortic valve is tricuspid. Aortic valve regurgitation is not visualized. No aortic stenosis is present. Pulmonic Valve: The pulmonic valve was grossly normal. Pulmonic valve regurgitation is trivial. No evidence of pulmonic stenosis. Aorta: The aortic root and ascending aorta are structurally normal, with no evidence of dilitation. Venous: The inferior vena cava is dilated in size with less than 50% respiratory variability, suggesting right atrial pressure of  15 mmHg. IAS/Shunts: The atrial septum is grossly normal.  LEFT VENTRICLE PLAX 2D LVIDd:         3.70 cm   Diastology LVIDs:         2.50 cm   LV e' medial:    8.16 cm/s LV PW:         2.30 cm   LV E/e' medial:  13.1 LV IVS:        1.40 cm   LV e' lateral:   8.81 cm/s LVOT diam:     2.00 cm   LV E/e' lateral: 12.1 LV SV:         82 LV SV Index:   39 LVOT Area:     3.14 cm  RIGHT VENTRICLE             IVC RV S prime:     14.70 cm/s  IVC diam: 2.40 cm TAPSE (M-mode): 1.8 cm LEFT ATRIUM              Index        RIGHT ATRIUM           Index LA diam:        4.00 cm  1.92 cm/m   RA Area:     16.90 cm LA Vol (A2C):   101.0 ml 48.37 ml/m  RA Volume:   43.20 ml  20.69 ml/m LA Vol (A4C):   72.8 ml  34.87 ml/m LA Biplane Vol: 87.2 ml  41.76 ml/m  AORTIC VALVE LVOT Vmax:   156.00 cm/s LVOT Vmean:  115.000 cm/s LVOT VTI:    0.261 m  AORTA Ao Root diam: 2.80 cm Ao Asc diam:  3.00 cm MITRAL VALVE MV Area (PHT): 3.11 cm     SHUNTS MV Decel Time: 244 msec     Systemic VTI:  0.26 m MV E velocity: 106.50 cm/s  Systemic Diam: 2.00 cm Eleonore Chiquito MD Electronically  signed by Eleonore Chiquito MD Signature Date/Time: 09/18/2022/3:19:08 PM    Final     Labs: BMET Recent Labs  Lab 09/17/22 5993 09/18/22 0658 09/19/22 0332 09/20/22 0001  NA 138 141 136 134*  K 3.8 3.3* 3.4* 3.5  CL 106 106 104 103  CO2 '22 22 23 22  '$ GLUCOSE 101* 144* 110* 124*  BUN 41* 36* 38* 36*  CREATININE 4.10* 3.97* 4.19* 4.47*  CALCIUM 8.4* 8.6* 8.6* 8.5*  PHOS  --   --  3.4 3.4   CBC Recent Labs  Lab 09/17/22 0711 09/18/22 0658 09/19/22 0332 09/20/22 0001  WBC 5.1 6.5 6.0 6.5  HGB 9.6* 9.3* 9.0* 8.8*  HCT 28.8* 27.2* 26.2* 25.9*  MCV 69.4* 67.8* 67.0* 66.9*  PLT 278 288 296 280    Medications:     aspirin EC  81 mg Oral Daily   atorvastatin  40 mg Oral Daily   carvedilol  50 mg Oral BID   enoxaparin (LOVENOX) injection  30 mg Subcutaneous Q24H   fluticasone furoate-vilanterol  1 puff Inhalation Daily   And   umeclidinium bromide  1 puff Inhalation Daily   hydrALAZINE  100 mg Oral TID   isosorbide mononitrate  60 mg Oral Daily   NIFEdipine  60 mg Oral Daily   pantoprazole  40 mg Oral BID   torsemide  40 mg Oral Daily      Otelia Santee, MD 09/20/2022, 11:39 AM

## 2022-10-02 ENCOUNTER — Other Ambulatory Visit: Payer: Self-pay | Admitting: Urology

## 2022-10-02 ENCOUNTER — Telehealth: Payer: Self-pay | Admitting: Cardiovascular Disease

## 2022-10-02 NOTE — Telephone Encounter (Signed)
   Name: George Day  DOB: 1976/05/05  MRN: [id number]  Primary Cardiologist: Quay Burow, MD  Chart reviewed as part of pre-operative protocol coverage. Because of George Day's past medical history and time since last visit, he will require a follow-up in-office visit in order to better assess preoperative cardiovascular risk.  Pre-op covering staff: - Please schedule appointment and call patient to inform them. If patient already had an upcoming appointment within acceptable timeframe, please add "pre-op clearance" to the appointment notes so provider is aware. - Please contact requesting surgeon's office via preferred method (i.e, phone, fax) to inform them of need for appointment prior to surgery.    Mable Fill, Marissa Nestle, NP  10/02/2022, 4:48 PM

## 2022-10-02 NOTE — Telephone Encounter (Signed)
   [facility] Pre-operative Risk Assessment    Request for surgical clearance:  What type of surgery is being performed? Right Adrenalectomy   When is this surgery scheduled?  12/07/22   What type of clearance is required (medical clearance vs. Pharmacy clearance to hold med vs. Both)?  Both   Are there any medications that need to be held prior to surgery and how long? Aspirin, 5 days prior  Practice name and name of physician performing surgery?  [facility]  Ellison Hughs, MD   What is your office phone number? [phone] ([phone]: [phone])   7.   What is your office fax number? [phone]  8.   Anesthesia type (None, local, MAC, general) ?  General    Zara Council 10/02/2022, 4:42 PM

## 2022-10-03 NOTE — Telephone Encounter (Signed)
Patient agreeable with appointment, address and directions given. Patient voiced understanding.

## 2022-10-05 NOTE — Progress Notes (Signed)
HEART & VASCULAR TRANSITION OF CARE CONSULT NOTE     Referring Physician: Dr. Jinny Sanders Primary Care: Pcp, No Primary Cardiologist: Dr. Gwenlyn Found  HPI: Referred to clinic by Dr. Jinny Sanders for heart failure consultation.   George Day is a 47 y.o. male with PMHx of CAD, DM2, CKD IV, adrenal mass, anemia, HTN, HLD, OSA on CPAP, asthma, MS and diastolic HF.  Hx of MI in 2010 with no stent, CVA 2010.   Admitted 12/21 with CP. HsTroponin flat not consistent with ACS. Echo showed EF 50 to 55%, no RWMA, grade 2 DD. Blood pressure was markedly elevated during this admission, borderline elevated troponin was felt to be related to high blood pressure. Renal artery duplex was normal. Started on BP medication and discharged home.  He had regular follow up with General Cardiology. Had chest pain and underwent Myoview (1/22) showed EF 42%, ST segment depression noted in lead II, III, aVF and V6, no evidence of ischemia or infarction on the Myoview perfusion study, intermediate risk study due to moderately reduced LVEF. Echo 12/21 showed normal EF, elected not to repeat echo.  Found to have R adrenal gland mass (DDx of old hematoma vs cystic neoplasm) in 2/22. He was referred by Endocrinology to Dr. Fredirick Maudlin for surgical excision.  Admitted 7/22 with HA, blurred vision and HTN urgency. BP was 213/126. Required ICU care and nicardipine gtt. Neurology consulted and MRI showed true cervical spinal cord lesions suggestive of demyelinating disease. He was started on steroid therapy with improvement in neuro symptoms. Drips weaned and placed on oral BP meds. Nephrology consulted with SCr 2.93>>3.4. He was discharged with Neurology and Nephrology follow up.  Admitted 12/23 with GIB. Underwent colonoscopy showing non-bleeding duodenal ulcer and a rectal polyp. Started on PPI and discharged with GI follow up. Admitted 1/24 with a/c dHF, had been off of his meds. BP was 235/127 on admission. He was diuresed  with IV lasix and home BP meds restarted. Echo showed EF 50-55%, severe concentric LVH, RV normal, moderate pericardial effusion. Nephrology consulted as his SCr 4.10 on admission, no indication for HD catheter placement. He was discharged home, weight 196 lbs.   Today he presents to [facility] for post hospital follow up with his wife. Overall feeling fine. He has SOB transiently after taking hydralazine and dyspnea walking up steps, but does OK walking on flat ground. He has occasional dizziness and tingling in hands/feet. Denies palpitations, CP, edema, or PND/Orthopnea. Appetite ok. No fever or chills. Weight at home 195-205 pounds. Taking all medications. Lives in a hotel, with his wife. They have 4 daughters, he is not working. Has has not followed up with Nephrology. Planning robotic renal adrenalectomy 4/24. Home  BP~ 134/75-80. He has transportation, cell phone and is now insured.   Family Hx: Mother: HF, deceased; Grandmother: "heart issue" DM and HTN, deceased  Cardiac Testing   - Echo (12/23): EF 50-55%, severe concentric LVH, RV normal, moderate pericardial effusion.   - Echo (12/21): EF 50 to 55%, no RWMA, grade 2 DD.  Review of Systems: [y] = yes, '[ ]'$  = no   General: Weight gain Blue.Reese ]; Weight loss '[ ]'$ ; Anorexia '[ ]'$ ; Fatigue '[ ]'$ ; Fever '[ ]'$ ; Chills '[ ]'$ ; Weakness '[ ]'$   Cardiac: Chest pain/pressure '[ ]'$ ; Resting SOB '[ ]'$ ; Exertional SOB Blue.Reese ]; Orthopnea '[ ]'$ ; Pedal Edema Blue.Reese ]; Palpitations '[ ]'$ ; Syncope '[ ]'$ ; Presyncope '[ ]'$ ; Paroxysmal nocturnal dyspnea'[ ]'$   Pulmonary: Cough '[ ]'$ ; Wheezing'[ ]'$ ;  Hemoptysis'[ ]'$ ; Sputum '[ ]'$ ; Snoring '[ ]'$   GI: Vomiting'[ ]'$ ; Dysphagia'[ ]'$ ; Melena'[ ]'$ ; Hematochezia '[ ]'$ ; Heartburn'[ ]'$ ; Abdominal pain '[ ]'$ ; Constipation '[ ]'$ ; Diarrhea '[ ]'$ ; BRBPR '[ ]'$   GU: Hematuria'[ ]'$ ; Dysuria '[ ]'$ ; Nocturia'[ ]'$   Vascular: Pain in legs with walking '[ ]'$ ; Pain in feet with lying flat '[ ]'$ ; Non-healing sores '[ ]'$ ; Stroke Blue.Reese ]; TIA '[ ]'$ ; Slurred speech '[ ]'$ ;  Neuro: Headaches'[ ]'$ ; Vertigo'[ ]'$ ; Seizures'[ ]'$ ;  Paresthesias'[ ]'$ ;Blurred vision '[ ]'$ ; Diplopia '[ ]'$ ; Vision changes '[ ]'$   Ortho/Skin: Arthritis '[ ]'$ ; Joint pain '[ ]'$ ; Muscle pain '[ ]'$ ; Joint swelling '[ ]'$ ; Back Pain '[ ]'$ ; Rash '[ ]'$   Psych: Depression'[ ]'$ ; Anxiety'[ ]'$   Heme: Bleeding problems '[ ]'$ ; Clotting disorders '[ ]'$ ; Anemia '[ ]'$   Endocrine: Diabetes Blue.Reese ]; Thyroid dysfunction'[ ]'$   Past Medical History:  Diagnosis Date   Anemia 1998   Anginal pain ([place])    Anxiety    Asthma    Coronary artery disease    Diabetes mellitus without complication ([place]) [id number]   Hypertension 2019   MI (myocardial infarction) ([place]) 2010   Sleep apnea    Stroke ([place]) 2010   Current Outpatient Medications  Medication Sig Dispense Refill   albuterol (VENTOLIN HFA) 108 (90 Base) MCG/ACT inhaler Inhale 1-2 puffs into the lungs every 2 (two) hours as needed for wheezing or shortness of breath. 18 g 0   aspirin EC 81 MG tablet Take 1 tablet (81 mg total) by mouth daily. 30 tablet 0   atorvastatin (LIPITOR) 40 MG tablet Take 1 tablet (40 mg total) by mouth daily. 30 tablet 0   carvedilol (COREG) 25 MG tablet Take 2 tablets (50 mg total) by mouth 2 (two) times daily. 120 tablet 0   docusate sodium (COLACE) 100 MG capsule Take 100 mg by mouth 2 (two) times daily.     famotidine (PEPCID) 20 MG tablet Take 20 mg by mouth daily.     Fluticasone-Umeclidin-Vilant (TRELEGY ELLIPTA) 100-62.5-25 MCG/ACT AEPB Inhale 1 puff into the lungs daily. 60 each 0   isosorbide-hydrALAZINE (BIDIL) 20-37.5 MG tablet Take 2 tablets by mouth 3 (three) times daily. 180 tablet 0   NIFEdipine (ADALAT CC) 90 MG 24 hr tablet Take 1 tablet (90 mg total) by mouth daily. 30 tablet 0   nitroGLYCERIN (NITROSTAT) 0.6 MG SL tablet Place 0.6 mg under the tongue every 5 (five) minutes as needed for chest pain.     torsemide (DEMADEX) 20 MG tablet Take 2 tablets (40 mg total) by mouth daily. 60 tablet 0   No current facility-administered medications for this encounter.   Allergies  Allergen Reactions   Honey Bee  Venom [Bee Venom] Anaphylaxis   Penicillins Anaphylaxis    ALL   Tomato Swelling   Social History   Socioeconomic History   Marital status: Married    Spouse name: Not on file   Number of children: 4   Years of education: Not on file   Highest education level: 10th grade  Occupational History   Occupation: Education administrator  Tobacco Use   Smoking status: Former    Packs/day: 0.50    Years: 31.00    Total pack years: 15.50    Types: Cigars, E-cigarettes, Cigarettes    Quit date: 09/2021    Years since quitting: 1.0   Smokeless tobacco: Never  Vaping Use   Vaping Use: Never used  Substance and Sexual Activity   Alcohol use: Not  Currently    Comment: occasioanlly    Drug use: Not Currently    Types: Marijuana   Sexual activity: Yes    Birth control/protection: None  Other Topics Concern   Not on file  Social History Narrative   Not on file   Social Determinants of Health   Financial Resource Strain: Medium Risk (09/20/2022)   Overall Financial Resource Strain (CARDIA)    Difficulty of Paying Living Expenses: Somewhat hard  Food Insecurity: Food Insecurity Present (09/17/2022)   Hunger Vital Sign    Worried About Running Out of Food in the Last Year: Often true    Ran Out of Food in the Last Year: Often true  Transportation Needs: No Transportation Needs (09/17/2022)   PRAPARE - Hydrologist (Medical): No    Lack of Transportation (Non-Medical): No  Physical Activity: Not on file  Stress: Not on file  Social Connections: Not on file  Intimate Partner Violence: Not At Risk (09/17/2022)   Humiliation, Afraid, Rape, and Kick questionnaire    Fear of Current or Ex-Partner: No    Emotionally Abused: No    Physically Abused: No    Sexually Abused: No   Family History  Problem Relation Age of Onset   Diabetes Mother    Hyperlipidemia Mother    Diabetes Sister    Hyperlipidemia Sister    Hearing loss Sister    Diabetes Brother     Hyperlipidemia Brother    BP (!) 156/82   Pulse 76   Wt 96 kg (211 lb 9.6 oz)   SpO2 98%   BMI 31.25 kg/m   Wt Readings from Last 3 Encounters:  10/12/22 96 kg (211 lb 9.6 oz)  09/20/22 89.3 kg (196 lb 12.8 oz)  08/17/22 92.9 kg (204 lb 12.9 oz)   PHYSICAL EXAM: General:  NAD. No resp difficulty, walked into clinic HEENT: Normal Neck: Supple. JVP to jaw. Carotids 2+ bilat; no bruits. No lymphadenopathy or thryomegaly appreciated. Cor: PMI nondisplaced. Regular rate & rhythm. No rubs, gallops or murmurs. Lungs: Clear Abdomen: Soft, nontender, nondistended. No hepatosplenomegaly. No bruits or masses. Good bowel sounds. Extremities: No cyanosis, clubbing, rash, 1-2+ BLE edema Neuro: Alert & oriented x 3, cranial nerves grossly intact. Moves all 4 extremities w/o difficulty. Affect pleasant.  ECG (personally reviewed): NSR 74 bpm  ReDs: 48%  ASSESSMENT & PLAN: Chronic Diastolic Heart Failure - Echo (12/21): EF 50-55% - Myoview (1/22) showed EF 42%, ST segment depression noted in lead II, III, aVF and V6, no evidence of ischemia or infarction, intermediate risk study due to moderately reduced LVEF - SPEP and urine immunofixation negative 8/23.  - Echo (1/24): EF 50-55%, severe concentric LVH  - Consider cath sooner rather than later, esp as CKD progresses closer to HD - With severe LVH on echo,? Amyloidosis, although more likely 2/2 HTN; obtain cMRI & send genetic testing for TTR. - NYHA II-early III, volume up today, ReDs 49%. GDMT limited by CKD IV. - Increase torsemide to 80 mg daily. Add 20 KCL daily. - Continue Farxiga 10 mg daily. - Continue BiDil 2 tabs tid. - Continue Coreg 50 mg bid. - BMET and BNP today, repeat BMET and BNP in 1 week.  CAD - MI in 2010, no intervention or stent. - Myoview 1/22 no evidence of infarction/ischemia - No chest pain - Continue ASA + statin + beta blocker.  HTN - BP better, but still elevated.  - Consider GDMT as above. - Stop  nifedipine. - Start amlodipine 10 mg daily. - Consider referral to HTN clinic.  CKD IV - avoid NSAIDs + hypotension - Missed appt with Nephrology - Re-refer back to [facility], likely nearly HD soon. - Baseline SCr 4.  HLD - LDL 75 (10/23) - Continue statin.  6. Right adrenal mass - CT showed 7.4 cm mass, concern for malignancy - Planning robotic adrenalectomy 4/24  7. Multiple Sclerosis - Saw Neurology in [place], did not start meds. - Refer to Neurology  NYHA II-early III GDMT  Diuretic: Torsemide, increase to 80 daily. BB: Coreg 50 mg bid Ace/ARB/ARNI: none with CKD MRA: none with CKD SGLT2i: Farxiga 10 mg daily.  Referred to HFSW (PCP, Medications, Transportation, ETOH Abuse, Drug Abuse, Insurance, Financial ): Yes Refer to Pharmacy: No Refer to Home Health: No Refer to Advanced Heart Failure Clinic: No Refer to General Cardiology: Yes, next visit.  Follow up: in 2 weeks with TOC APP for med/BP check, then refer back to General Cardiology (Dr. Gwenlyn Found).

## 2022-10-08 ENCOUNTER — Other Ambulatory Visit (HOSPITAL_COMMUNITY): Payer: Self-pay

## 2022-10-09 ENCOUNTER — Other Ambulatory Visit: Payer: Self-pay | Admitting: Urology

## 2022-10-09 DIAGNOSIS — E278 Other specified disorders of adrenal gland: Secondary | ICD-10-CM

## 2022-10-10 NOTE — Progress Notes (Signed)
Cardiology Clinic Note   Patient Name: George Day Date of Encounter: 10/12/2022  Primary Care Provider:  Pcp, No Primary Cardiologist:  Quay Burow, MD  Patient Profile     George Day 47 year old male presents the clinic today for follow-up evaluation of his hypertension and coronary artery disease.  Past Medical History    Past Medical History:  Diagnosis Date   Anemia 1998   Anginal pain ([place])    Anxiety    Asthma    Coronary artery disease    Diabetes mellitus without complication ([place]) [id number]   Hypertension 2019   MI (myocardial infarction) ([place]) 2010   Sleep apnea    Stroke [facility]) 2010   Past Surgical History:  Procedure Laterality Date   BIOPSY  08/18/2022   Procedure: BIOPSY;  Surgeon: Sharyn Creamer, MD;  Location: [place];  Service: Gastroenterology;;   CARDIAC CATHETERIZATION  2010   COLONOSCOPY WITH PROPOFOL N/A 08/18/2022   Procedure: COLONOSCOPY WITH PROPOFOL;  Surgeon: Sharyn Creamer, MD;  Location: [place];  Service: Gastroenterology;  Laterality: N/A;   ESOPHAGOGASTRODUODENOSCOPY (EGD) WITH PROPOFOL N/A 08/18/2022   Procedure: ESOPHAGOGASTRODUODENOSCOPY (EGD) WITH PROPOFOL;  Surgeon: Sharyn Creamer, MD;  Location: [place];  Service: Gastroenterology;  Laterality: N/A;   POLYPECTOMY  08/18/2022   Procedure: POLYPECTOMY;  Surgeon: Sharyn Creamer, MD;  Location: [facility] ENDOSCOPY;  Service: Gastroenterology;;    Allergies  Allergies  Allergen Reactions   Honey Bee Venom [Bee Venom] Anaphylaxis   Penicillins Anaphylaxis    ALL   Tomato Swelling    History of Present Illness     George Day has a PMH of hypertension, CAD, HLD, pericardial effusion, diabetes, CKD stage IV, homelessness, tobacco dependence, CVA, obesity, generalized anxiety disorder, anasarca, and anemia.  He was seen in follow-up by Dr. Gwenlyn Found on 11/15/2020.  He reported that he relocated from [facility] to [place].  He was accompanied by his wife.  He had  been initially seen 12/21 during hospitalization.  He had renal Doppler studies that did not show evidence of renal artery stenosis.  His echocardiogram showed low normal EF with moderate concentric LVH.  Nuclear stress test showed low risk without significant ischemia.  He denied chest pain since hospitalization.  He did note occasional shortness of breath which was improved with his inhalers.  He was intolerant of antihypertensive medication.  He presents to the clinic today for follow-up evaluation and preoperative cardiac evaluation.  He states he feels he is doing well except for his lower extremity swelling.  We reviewed his previous stress testing and echocardiogram.  He expressed understanding.  He presents with his wife today.  We reviewed his upcoming surgery.  He was seen earlier today by advanced heart failure clinic.  At that time his torsemide was increased.  Labs are also planned.  I will continue his current medication regimen and plan for fasting lipids in the MDs next week when his blood is drawn.  I have asked him to continue heart healthy low-sodium diet, increase physical activity as tolerated and we will plan follow-up in 6 months.  Today denies chest pain, shortness of breath, lower extremity edema, fatigue, palpitations, melena, hematuria, hemoptysis, diaphoresis, weakness, presyncop, and syncope.   Home Medications    Prior to Admission medications   Medication Sig Start Date End Date Taking? Authorizing Provider  albuterol (VENTOLIN HFA) 108 (90 Base) MCG/ACT inhaler Inhale 1-2 puffs into the lungs every 2 (two) hours as needed for wheezing or  shortness of breath. 09/20/22   Gerrit Heck, MD  aspirin EC 81 MG tablet Take 1 tablet (81 mg total) by mouth daily. 09/20/22   Gerrit Heck, MD  atorvastatin (LIPITOR) 40 MG tablet Take 1 tablet (40 mg total) by mouth daily. 09/20/22   Gerrit Heck, MD  carvedilol (COREG) 25 MG tablet Take 2 tablets (50 mg total) by mouth 2 (two)  times daily. 09/20/22   Gerrit Heck, MD  docusate sodium (COLACE) 100 MG capsule Take 100 mg by mouth 2 (two) times daily.    [provider]  famotidine (PEPCID) 20 MG tablet Take 20 mg by mouth daily.    [provider]  ferrous sulfate 325 (65 FE) MG tablet Take 1 tablet (325 mg total) by mouth daily with breakfast. 08/19/22   Danford, Suann Larry, MD  Fluticasone-Umeclidin-Vilant (TRELEGY ELLIPTA) 100-62.5-25 MCG/ACT AEPB Inhale 1 puff into the lungs daily. 09/20/22   Gerrit Heck, MD  isosorbide-hydrALAZINE (BIDIL) 20-37.5 MG tablet Take 2 tablets by mouth 3 (three) times daily. 09/20/22 10/20/22  Gerrit Heck, MD  NIFEdipine (ADALAT CC) 90 MG 24 hr tablet Take 1 tablet (90 mg total) by mouth daily. 09/20/22   Gerrit Heck, MD  nitroGLYCERIN (NITROSTAT) 0.6 MG SL tablet Place 0.6 mg under the tongue every 5 (five) minutes as needed for chest pain.    [provider]  pantoprazole (PROTONIX) 40 MG tablet Take 1 tablet (40 mg total) by mouth 2 (two) times daily. 08/18/22   Danford, Suann Larry, MD  torsemide (DEMADEX) 20 MG tablet Take 2 tablets (40 mg total) by mouth daily. 09/20/22   Gerrit Heck, MD  Vitamin D, Ergocalciferol, (DRISDOL) 1.25 MG (50000 UNIT) CAPS capsule Take 1 capsule (50,000 Units total) by mouth every 7 (seven) days. 03/03/21   Domenic Polite, MD    Family History    Family History  Problem Relation Age of Onset   Diabetes Mother    Hyperlipidemia Mother    Diabetes Sister    Hyperlipidemia Sister    Hearing loss Sister    Diabetes Brother    Hyperlipidemia Brother    He indicated that his mother is deceased. He indicated that his father is deceased. He indicated that the status of his sister is unknown. He indicated that the status of his brother is unknown.  Social History    Social History   Socioeconomic History   Marital status: Married    Spouse name: Not on file   Number of children: 4   Years of education: Not  on file   Highest education level: 10th grade  Occupational History   Occupation: Education administrator  Tobacco Use   Smoking status: Former    Packs/day: 0.50    Years: 31.00    Total pack years: 15.50    Types: Cigars, E-cigarettes, Cigarettes    Quit date: 09/2021    Years since quitting: 1.0   Smokeless tobacco: Never  Vaping Use   Vaping Use: Never used  Substance and Sexual Activity   Alcohol use: Not Currently    Comment: occasioanlly    Drug use: Not Currently    Types: Marijuana   Sexual activity: Yes    Birth control/protection: None  Other Topics Concern   Not on file  Social History Narrative   Not on file   Social Determinants of Health   Financial Resource Strain: Medium Risk (09/20/2022)   Overall Financial Resource Strain (CARDIA)    Difficulty of Paying Living Expenses: Somewhat  hard  Food Insecurity: Food Insecurity Present (10/12/2022)   Hunger Vital Sign    Worried About Running Out of Food in the Last Year: Often true    Ran Out of Food in the Last Year: Often true  Transportation Needs: No Transportation Needs (09/17/2022)   PRAPARE - Hydrologist (Medical): No    Lack of Transportation (Non-Medical): No  Physical Activity: Not on file  Stress: Not on file  Social Connections: Not on file  Intimate Partner Violence: Not At Risk (09/17/2022)   Humiliation, Afraid, Rape, and Kick questionnaire    Fear of Current or Ex-Partner: No    Emotionally Abused: No    Physically Abused: No    Sexually Abused: No     Review of Systems    General:  No chills, fever, night sweats or weight changes.  Cardiovascular:  No chest pain, dyspnea on exertion, edema, orthopnea, palpitations, paroxysmal nocturnal dyspnea. Dermatological: No rash, lesions/masses Respiratory: No cough, dyspnea Urologic: No hematuria, dysuria Abdominal:   No nausea, vomiting, diarrhea, bright red blood per rectum, melena, or hematemesis Neurologic:  No  visual changes, wkns, changes in mental status. All other systems reviewed and are otherwise negative except as noted above.  Physical Exam    VS:  BP 122/72   Ht '5\' 9"'$  (1.753 m)   Wt 212 lb 3.2 oz (96.3 kg)   SpO2 97%   BMI 31.34 kg/m  , BMI Body mass index is 31.34 kg/m. GEN: Well nourished, well developed, in no acute distress. HEENT: normal. Neck: Supple, no JVD, carotid bruits, or masses. Cardiac: RRR, systolic murmur 3/6 heard along left sternal border , rubs, or gallops. No clubbing, cyanosis, 1-2+ bilateral lower extremity edema.  Radials/DP/PT 2+ and equal bilaterally.  Respiratory:  Respirations regular and unlabored, clear to auscultation bilaterally. GI: Soft, nontender, nondistended, BS + x 4. MS: no deformity or atrophy. Skin: warm and dry, no rash. Neuro:  Strength and sensation are intact. Psych: Normal affect.  Accessory Clinical Findings    Recent Labs: 08/16/2022: ALT 36 09/19/2022: Magnesium 2.1 10/12/2022: B Natriuretic Peptide 232.8; BUN 60; Creatinine, Ser 4.92; Hemoglobin 7.9; Platelets 192; Potassium 3.4; Sodium 139   Recent Lipid Panel    Component Value Date/Time   CHOL 137 08/09/2020 0242   CHOL 181 01/05/2020 1515   TRIG 110 08/09/2020 0242   HDL 44 08/09/2020 0242   HDL 41 01/05/2020 1515   CHOLHDL 3.1 08/09/2020 0242   VLDL 22 08/09/2020 0242   LDLCALC 71 08/09/2020 0242   LDLCALC 111 (H) 01/05/2020 1515         ECG personally reviewed by me today-reviewed EKG done earlier today.  Echocardiogram 09/18/2022  IMPRESSIONS     1. Left ventricular ejection fraction, by estimation, is 50 to 55%. The  left ventricle has low normal function. The left ventricle has no regional  wall motion abnormalities. There is severe concentric left ventricular  hypertrophy. Indeterminate  diastolic filling due to E-A fusion.   2. Right ventricular systolic function is normal. The right ventricular  size is normal. Tricuspid regurgitation signal is  inadequate for assessing  PA pressure.   3. Left atrial size was mildly dilated.   4. Moderate circumferential pericardial effusion, up to 1.9 cm posterior  to the LV. No signs of tamponade. Moderate pericardial effusion. The  pericardial effusion is circumferential. There is no evidence of cardiac  tamponade.   5. The mitral valve is grossly normal. Mild mitral  valve regurgitation.  No evidence of mitral stenosis.   6. The aortic valve is tricuspid. Aortic valve regurgitation is not  visualized. No aortic stenosis is present.   7. The inferior vena cava is dilated in size with <50% respiratory  variability, suggesting right atrial pressure of 15 mmHg.   Comparison(s): Changes from prior study are noted.   FINDINGS   Left Ventricle: Left ventricular ejection fraction, by estimation, is 50  to 55%. The left ventricle has low normal function. The left ventricle has  no regional wall motion abnormalities. Definity contrast agent was given  IV to delineate the left  ventricular endocardial borders. The left ventricular internal cavity size  was normal in size. There is severe concentric left ventricular  hypertrophy. Indeterminate diastolic filling due to E-A fusion.   Right Ventricle: The right ventricular size is normal. No increase in  right ventricular wall thickness. Right ventricular systolic function is  normal. Tricuspid regurgitation signal is inadequate for assessing PA  pressure.   Left Atrium: Left atrial size was mildly dilated.   Right Atrium: Right atrial size was normal in size.   Pericardium: Moderate circumferential pericardial effusion, up to 1.9 cm  posterior to the LV. No signs of tamponade. A moderately sized pericardial  effusion is present. The pericardial effusion is circumferential. There is  no evidence of cardiac  tamponade.   Mitral Valve: The mitral valve is grossly normal. Mild mitral valve  regurgitation. No evidence of mitral valve stenosis.    Tricuspid Valve: The tricuspid valve is grossly normal. Tricuspid valve  regurgitation is trivial. No evidence of tricuspid stenosis.   Aortic Valve: The aortic valve is tricuspid. Aortic valve regurgitation is  not visualized. No aortic stenosis is present.   Pulmonic Valve: The pulmonic valve was grossly normal. Pulmonic valve  regurgitation is trivial. No evidence of pulmonic stenosis.   Aorta: The aortic root and ascending aorta are structurally normal, with  no evidence of dilitation.   Venous: The inferior vena cava is dilated in size with less than 50%  respiratory variability, suggesting right atrial pressure of 15 mmHg.   IAS/Shunts: The atrial septum is grossly normal.    Assessment & Plan   1.  Essential hypertension-BP today 122/72. Continue carvedilol, isosorbide, hydralazine, torsemide, nifedipine Heart healthy low-sodium diet Increase physical activity as tolerated   Coronary artery disease-no chest pain today.  Denies recent episodes of chest discomfort.  Underwent nuclear stress test which showed low risk and no significant ischemia. Continue aspirin, isosorbide, hydralazine Heart healthy low-sodium diet Increase physical activity as tolerated   Hyperlipidemia-LDL 71 08/10/23 Low-sodium high-fiber diet Continue aspirin, atorvastatin  Chronic diastolic CHF-no increased work of breathing today.  Echocardiogram 1221 showed EF 50-55% and G2 DD.  NYHA class III.  With 1-2+ bilateral lower extremity edema.  Was seen by advanced heart failure NP [facility] today.  Torsemide was increased. Follow-up with advanced heart failure team.  Preoperative cardiac evaluation-robotic assisted adrenalectomy 05/04/2023, Dr. Ellison Hughs    Primary Cardiologist: Quay Burow, MD  Chart reviewed as part of pre-operative protocol coverage. Given past medical history and time since last visit, based on ACC/AHA guidelines, Rawleigh Harpel would be at acceptable risk  for the planned procedure without further cardiovascular testing.   His RCRI is a class II risk, 0.9% risk of major cardiac event.  He is able to complete greater than 4 METS of physical activity.  His aspirin is prescribed by a non cardiology provider.  Recommendations for holding  aspirin will need to come from prescribing provider.  Patient was advised that if he develops new symptoms prior to surgery to contact our office to arrange a follow-up appointment.  He verbalized understanding.  I will route this recommendation to the requesting party via Epic fax function and remove from pre-op pool.    Position: Follow-up with Dr.Berry in 6 months.   Jossie Ng. Manley Fason NP-C     10/12/2022, 2:50 PM [place] [address] Office [phone] Fax [phone]    I spent 14 minutes examining this patient, reviewing medications, and using patient centered shared decision making involving her cardiac care.  Prior to her visit I spent greater than 20 minutes reviewing her past medical history,  medications, and prior cardiac tests.

## 2022-10-11 ENCOUNTER — Telehealth (HOSPITAL_COMMUNITY): Payer: Self-pay

## 2022-10-11 NOTE — Telephone Encounter (Signed)
Called to confirm Heart & Vascular Transitions of Care appointment at 10/12/22. Patient reminded to bring all medications and pill box organizer with them. Confirmed patient has transportation. Gave directions, instructed to utilize [facility] parking.  Confirmed appointment prior to ending call.

## 2022-10-11 NOTE — Progress Notes (Signed)
   Heart and Vascular Center Transitions of Care Clinic Heart Failure Pharmacist Encounter  PCP: Pcp, No PCP-Cardiologist: Quay Burow, MD  HPI:  18 YOM with PMH significant for CKD stage IV, T2DM, HLD, COPD, CAD, MS CVA, former tobacco use, daily marijuana use and CHF. He presented to the ED on 09/17/22 with markedly elevated BP readings after being off of his medication for ~ a week. He reported SOB, orthopnea, weight gain, and bilateral LEE. ECHO on 09/18/22 showed EF 50-55% with severe LVH.   Planned cMRI while he was in [place]. Per nephrology in [place], the SPEP and UPEP suggested glomerular proteinuria. In addition, although kappa and lambda light chains were elevated, the kappa to lambda ratio was normal. So, amyloidosis is unlikely.   Today, George Day presents to the [facility] for follow up. ReDs today was 48%. Currently living in a hotel. He reports SOB when taking his hydralazine and when walking up steps. He is positive for edema and JVP. He reports systolic BP readings at home are [id number] SBP systolic. He endorses taking medications as prescribed.   Patient was previously on amlodipine for BP, currently managed on nifedipine. Patient stated he had headaches when he was on the medication, but felt it was unlikely that the medication was the case.   HF Medications: Diuretic: torsemide 48m once daily Beta Blocker: carvedilol 51067mBID Other: Bidil 20-37.67m83m tablets TID  Has the patient been experiencing any side effects to the medications prescribed?  no  Does the patient have any problems obtaining medications due to transportation or finances?   Not currently; in the past he did  Understanding of regimen: good Understanding of indications: good Potential of compliance: good Patient understands to avoid NSAIDs. Patient understands to avoid decongestants.   Pertinent Lab Values: Serum creatinine 4.92, BUN 60, Potassium 3.4, Sodium 139, BNP 232.8  Vital  Signs: Weight: 211 lbs (discharge weight: 196 lbs) Blood pressure: 156/82  Heart rate: 76   Medication Assistance / Insurance Benefits Check: Does the patient have prescription insurance?  Yes Type of insurance plan: [place] Medicaid  [facility] Pharm[facility]  [facility]: [facility]) Chronic diastolic CHF (EF [id number] class II-III symptoms. - GDMT limited by advanced CKD. - Would benefit from additional BP control offered by amlodipine, compared to his current therapy of nifedipine. - Currently volume-overloaded. Will need an increase in his diuretic therapy, along with potassium supplementation  Plan: 1) Medication changes: - Stop nifedipine -Start amlodipine 57m33mce daily -Increase torsemide to 80mg57me daily (4 tablets) -Start potassium 20mEq68me daily   2) Follow up: - Next appointment with NP [facility] later today  AllysoMaryan PulsmD PGY-1 [facility] Resident

## 2022-10-12 ENCOUNTER — Encounter (HOSPITAL_COMMUNITY): Payer: Self-pay

## 2022-10-12 ENCOUNTER — Other Ambulatory Visit: Payer: Self-pay

## 2022-10-12 ENCOUNTER — Ambulatory Visit
Admission: RE | Admit: 2022-10-12 | Discharge: 2022-10-12 | Disposition: A | Discharge status: Home / Self Care | Payer: Medicaid Other | Source: Ambulatory Visit | Attending: Family Medicine | Admitting: Family Medicine

## 2022-10-12 ENCOUNTER — Ambulatory Visit (INDEPENDENT_AMBULATORY_CARE_PROVIDER_SITE_OTHER): Payer: Medicaid Other | Admitting: General Practice

## 2022-10-12 ENCOUNTER — Encounter: Payer: Self-pay | Admitting: General Practice

## 2022-10-12 VITALS — BP 122/72 | Ht 69.0 in | Wt 212.2 lb

## 2022-10-12 VITALS — BP 156/82 | HR 76 | Wt 211.6 lb

## 2022-10-12 DIAGNOSIS — Z01810 Encounter for preprocedural cardiovascular examination: Secondary | ICD-10-CM

## 2022-10-12 DIAGNOSIS — Z6831 Body mass index (BMI) 31.0-31.9, adult: Secondary | ICD-10-CM | POA: Insufficient documentation

## 2022-10-12 DIAGNOSIS — Z8349 Family history of other endocrine, nutritional and metabolic diseases: Secondary | ICD-10-CM | POA: Insufficient documentation

## 2022-10-12 DIAGNOSIS — I2510 Atherosclerotic heart disease of native coronary artery without angina pectoris: Secondary | ICD-10-CM | POA: Insufficient documentation

## 2022-10-12 DIAGNOSIS — I5032 Chronic diastolic (congestive) heart failure: Secondary | ICD-10-CM | POA: Diagnosis present

## 2022-10-12 DIAGNOSIS — Z8673 Personal history of transient ischemic attack (TIA), and cerebral infarction without residual deficits: Secondary | ICD-10-CM | POA: Diagnosis not present

## 2022-10-12 DIAGNOSIS — E279 Disorder of adrenal gland, unspecified: Secondary | ICD-10-CM | POA: Insufficient documentation

## 2022-10-12 DIAGNOSIS — Z5941 Food insecurity: Secondary | ICD-10-CM | POA: Diagnosis not present

## 2022-10-12 DIAGNOSIS — R29818 Other symptoms and signs involving the nervous system: Secondary | ICD-10-CM | POA: Insufficient documentation

## 2022-10-12 DIAGNOSIS — I130 Hypertensive heart and chronic kidney disease with heart failure and stage 1 through stage 4 chronic kidney disease, or unspecified chronic kidney disease: Secondary | ICD-10-CM | POA: Insufficient documentation

## 2022-10-12 DIAGNOSIS — E1122 Type 2 diabetes mellitus with diabetic chronic kidney disease: Secondary | ICD-10-CM | POA: Insufficient documentation

## 2022-10-12 DIAGNOSIS — E669 Obesity, unspecified: Secondary | ICD-10-CM | POA: Diagnosis not present

## 2022-10-12 DIAGNOSIS — Z8249 Family history of ischemic heart disease and other diseases of the circulatory system: Secondary | ICD-10-CM | POA: Diagnosis not present

## 2022-10-12 DIAGNOSIS — G35D Multiple sclerosis, unspecified: Secondary | ICD-10-CM

## 2022-10-12 DIAGNOSIS — G4733 Obstructive sleep apnea (adult) (pediatric): Secondary | ICD-10-CM | POA: Diagnosis not present

## 2022-10-12 DIAGNOSIS — I10 Essential (primary) hypertension: Secondary | ICD-10-CM

## 2022-10-12 DIAGNOSIS — E785 Hyperlipidemia, unspecified: Secondary | ICD-10-CM

## 2022-10-12 DIAGNOSIS — Z79899 Other long term (current) drug therapy: Secondary | ICD-10-CM | POA: Diagnosis not present

## 2022-10-12 DIAGNOSIS — Z87891 Personal history of nicotine dependence: Secondary | ICD-10-CM | POA: Diagnosis not present

## 2022-10-12 DIAGNOSIS — I252 Old myocardial infarction: Secondary | ICD-10-CM | POA: Diagnosis not present

## 2022-10-12 DIAGNOSIS — Z833 Family history of diabetes mellitus: Secondary | ICD-10-CM | POA: Insufficient documentation

## 2022-10-12 DIAGNOSIS — G35 Multiple sclerosis: Secondary | ICD-10-CM | POA: Diagnosis not present

## 2022-10-12 DIAGNOSIS — N184 Chronic kidney disease, stage 4 (severe): Secondary | ICD-10-CM

## 2022-10-12 DIAGNOSIS — Z5901 Sheltered homelessness: Secondary | ICD-10-CM | POA: Diagnosis not present

## 2022-10-12 DIAGNOSIS — Z5986 Financial insecurity: Secondary | ICD-10-CM | POA: Insufficient documentation

## 2022-10-12 LAB — CBC
HCT: 24 % — ABNORMAL LOW (ref 39.0–52.0)
Hemoglobin: 7.9 g/dL — ABNORMAL LOW (ref 13.0–17.0)
MCH: 22.4 pg — ABNORMAL LOW (ref 26.0–34.0)
MCHC: 32.9 g/dL (ref 30.0–36.0)
MCV: 68.2 fL — ABNORMAL LOW (ref 80.0–100.0)
Platelets: 192 10*3/uL (ref 150–400)
RBC: 3.52 MIL/uL — ABNORMAL LOW (ref 4.22–5.81)
RDW: 17.9 % — ABNORMAL HIGH (ref 11.5–15.5)
WBC: 4 10*3/uL (ref 4.0–10.5)
nRBC: 0 % (ref 0.0–0.2)

## 2022-10-12 LAB — BASIC METABOLIC PANEL
Anion gap: 10 (ref 5–15)
BUN: 60 mg/dL — ABNORMAL HIGH (ref 6–20)
CO2: 22 mmol/L (ref 22–32)
Calcium: 8.3 mg/dL — ABNORMAL LOW (ref 8.9–10.3)
Chloride: 107 mmol/L (ref 98–111)
Creatinine, Ser: 4.92 mg/dL — ABNORMAL HIGH (ref 0.61–1.24)
GFR, Estimated: 14 mL/min — ABNORMAL LOW (ref >60)
Glucose, Bld: 107 mg/dL — ABNORMAL HIGH (ref 70–99)
Potassium: 3.4 mmol/L — ABNORMAL LOW (ref 3.5–5.1)
Sodium: 139 mmol/L (ref 135–145)

## 2022-10-12 LAB — BRAIN NATRIURETIC PEPTIDE: B Natriuretic Peptide: 232.8 pg/mL — ABNORMAL HIGH (ref 0.0–100.0)

## 2022-10-12 MED ORDER — AMLODIPINE BESYLATE 10 MG PO TABS
10.0000 mg | ORAL_TABLET | Freq: Every day | ORAL | 11 refills | Status: DC
  Filled 2022-10-12: qty 30, 30d supply, fill #0

## 2022-10-12 MED ORDER — POTASSIUM CHLORIDE CRYS ER 20 MEQ PO TBCR
20.0000 meq | EXTENDED_RELEASE_TABLET | Freq: Every day | ORAL | 3 refills | Status: DC
  Filled 2022-10-12: qty 90, 90d supply, fill #0

## 2022-10-12 MED ORDER — TORSEMIDE 20 MG PO TABS
80.0000 mg | ORAL_TABLET | Freq: Every day | ORAL | 6 refills | Status: DC
  Filled 2022-10-12 – 2022-11-01 (×2): qty 120, 30d supply, fill #0

## 2022-10-12 NOTE — Patient Instructions (Signed)
Medication Instructions:  The current medical regimen is effective;  continue present plan and medications as directed. Please refer to the Current Medication list given to you today.  *If you need a refill on your cardiac medications before your next appointment, please call your pharmacy*  Lab Work: [place] LIPID AND LFT If you have labs (blood work) drawn today and your tests are completely normal, you will receive your results only by: [place] (if you have MyChart) OR  A paper copy in the mail If you have any lab test that is abnormal or we need to change your treatment, we will call you to review the results.  Testing/Procedures: NONE  Follow-Up: At [facility], you and your health needs are our priority.  As part of our continuing mission to provide you with exceptional heart care, we have created designated Provider Care Teams.  These Care Teams include your primary Cardiologist (physician) and Advanced Practice Providers (APPs -  Physician Assistants and Nurse Practitioners) who all work together to provide you with the care you need, when you need it.  Your next appointment:   4-6 month(s)  Provider:   Quay Burow, MD     Other Instructions WEIGH DAILY, CALL IF YOU GAIN 2-3 POUNDS/DAILY OR 5 POUNDS/WEEKLY  PLEASE READ AND FOLLOW ATTACHED  SALTY 6

## 2022-10-12 NOTE — Progress Notes (Signed)
ReDS Vest / Clip - 10/12/22 0900       ReDS Vest / Clip   Station Marker C    Ruler Value 27.5    ReDS Value Range High volume overload    ReDS Actual Value 48

## 2022-10-12 NOTE — Patient Instructions (Addendum)
STOP Nifedipine START Amlodipine 10 mg one tab daily START Potassium 20 meq one tab daily INCREASE Torsemide to 80 mg (4 tabs=80 mg) daily  Labs today We will only contact you if something comes back abnormal or we need to make some changes. Otherwise no news is good news!  Labs needed in one week  Your physician has requested that you have a cardiac MRI. Cardiac MRI uses a computer to create images of your heart as its beating, producing both still and moving pictures of your heart and major blood vessels. For further information please visit [email or web address] Please follow the instruction sheet given to you today for more information. -once approved with your insurance, scheduling will be in contact  You have been referred to [facility] -they will be in contact with an appointment  You have been referred to [facility]  -they will be in contact with an appointment  Your physician recommends that you schedule a follow-up appointment in: 2 weeks  Do the following things EVERYDAY: Weigh yourself in the morning before breakfast. Write it down and keep it in a log. Take your medicines as prescribed Eat low salt foods--Limit salt (sodium) to 2000 mg per day.  Stay as active as you can everyday Limit all fluids for the day to less than 2 liters

## 2022-10-12 NOTE — Progress Notes (Signed)
Heart and Vascular Care Navigation  10/12/2022  Angela Andaya 12-08-1975 [id number]  Reason for Referral: housing concerns Patient is participating in a Managed Medicaid Plan:Yes  Engaged with patient face to face for initial visit for Heart and Vascular Care Coordination.                                                                                                   Assessment:                                      CSW met with pt and pt wife in clinic to discuss current concerns.  Pt and wife just came back to [facility] from [place] at the beginning of December.  Has been in [place] before and has had to move back and forth due to "emergencies".  Now back in [place] and  plans to stay here- has [place] Medicaid.  Since moving back has been between motels and staying in their car.  Wife has 4 children (not assessed if any are biological children of the patient) who also lives with them in the motel.    Source of income at this time is from [employer] work (makes about $2,000/ month when getting full hours).  Also gets about $941 from one of the children who has had a hearing disability since birth and receives SSI.  Pt reports he has been approved for SSI himself and is awaiting payments to begin. Also receive food stamps but not sufficient- CSW provided with food pantry list and referral to [facility] as well as info about fresh food pantry through [facility].  They have been looking into housing options but admit to barriers due to patients criminal history and wife still owing money to a landlord- working on paying this off through payment plan.  They state they have worked with [facility] and [facility] but are not eligible for any programs through them at this time- CSW confirmed with [facility] that pt is on their wait list. Report no other case management assistance through case workers for the children- only one involved is a school CSW who just helps with some basic needs like gas money.  They report housing budget is about  $1000/month.  CSW looked through [employer] and other property management sites to find housing within that budget with 2 or more bedrooms- sent results to patient's email for review.  Explained that we could potentially help with some start up costs like security deposit or first months rent to help remove barriers to them moving in.  HRT/VAS Care Coordination     Patients Home Cardiology Office [place] arrangements for the past 2 months No permanent address; Hotel/Motel   Lives with: Spouse; Minor Children   Patient Current Insurance Coverage Medicaid   Patient Has Concern With Paying Medical Bills No   Does Patient Have Prescription Coverage? Yes   Home Assistive Devices/Equipment Cane (specify quad or straight)       Social History:  SDOH Screenings   Food Insecurity: Food Insecurity Present (10/12/2022)  Housing: High Risk (10/12/2022)  Transportation Needs: No Transportation Needs (09/17/2022)  Utilities: Not At Risk (09/17/2022)  Alcohol Screen: Low Risk  (09/20/2022)  Depression (PHQ2-9): Low Risk  (11/28/2020)  Financial Resource Strain: Medium Risk (09/20/2022)  Tobacco Use: Medium Risk (10/12/2022)    SDOH Interventions: Financial Resources:    Pending SSI for patient, income from wife about $2000/month for wife, and $941/month for minor child with disability.  Food Insecurity:  Food Insecurity Interventions: Other (Comment) (food pantry referral)  Housing Insecurity:  Housing Interventions: Other (Comment) (provided list of local housing options to pursue)  Transportation:    Has a car- sometimes gas is an issue    Follow-up plan:    Pt and wife to follow up on housing options CSW sent.  CSW provided hand off to [facility] CSW which will be pt Cardiology home.  CSW will continue to follow pt and assist as able- he will come back to [facility] for one more visit before being  transitioned to [facility] for long term management.  Jorge Ny, LCSW Clinical Social Worker [facility] Desk#: [phone] Cell#: [phone]

## 2022-10-16 ENCOUNTER — Telehealth (HOSPITAL_COMMUNITY): Payer: Self-pay

## 2022-10-16 ENCOUNTER — Telehealth (HOSPITAL_COMMUNITY): Payer: Self-pay | Admitting: Cardiology

## 2022-10-16 ENCOUNTER — Other Ambulatory Visit: Payer: Self-pay

## 2022-10-16 MED ORDER — POTASSIUM CHLORIDE CRYS ER 20 MEQ PO TBCR
40.0000 meq | EXTENDED_RELEASE_TABLET | Freq: Every day | ORAL | 2 refills | Status: DC
  Filled 2022-10-16 (×2): qty 60, 30d supply, fill #0

## 2022-10-16 NOTE — Telephone Encounter (Signed)
Patient advised and verbalized understanding. Med list updated to refect changes, patient has pending lab appt already scheduled.    Meds ordered this encounter  Medications   potassium chloride SA (KLOR-CON M) 20 MEQ tablet    Sig: Take 2 tablets (40 mEq total) by mouth daily.    Dispense:  60 tablet    Refill:  2    Please cancel all previous orders for current medication. Change in dosage or pill size.

## 2022-10-16 NOTE — Telephone Encounter (Signed)
-----   Message from Rafael Bihari, [place] sent at 10/12/2022  3:38 PM EST ----- K is low. We started potassium today, but needs to increase to 40 daily (not 20 as discussed at visit). Repeat BMET in 10 days  Hgb has dropped. He needs repeat CBC at his PCP to follow, and needs to follow up with his GI MD soon

## 2022-10-16 NOTE — Telephone Encounter (Signed)
Per Allena Katz, NP Pt will need nephrology referral  -per chart and discharge summary pt was referred for HFU on 2/19 @ 230  Confirmed with [facility], pt no showed, however no new referral is needed pt can call to reschedule   Pt updated and contact information for [facility] given to pt

## 2022-10-19 ENCOUNTER — Ambulatory Visit (HOSPITAL_COMMUNITY)
Admission: RE | Admit: 2022-10-19 | Discharge: 2022-10-19 | Disposition: A | Discharge status: Home / Self Care | Payer: Medicaid Other | Source: Ambulatory Visit | Attending: Cardiology | Admitting: Cardiology

## 2022-10-19 DIAGNOSIS — I5032 Chronic diastolic (congestive) heart failure: Secondary | ICD-10-CM | POA: Diagnosis present

## 2022-10-19 LAB — BASIC METABOLIC PANEL
Anion gap: 11 (ref 5–15)
BUN: 50 mg/dL — ABNORMAL HIGH (ref 6–20)
CO2: 24 mmol/L (ref 22–32)
Calcium: 8.4 mg/dL — ABNORMAL LOW (ref 8.9–10.3)
Chloride: 104 mmol/L (ref 98–111)
Creatinine, Ser: 4.86 mg/dL — ABNORMAL HIGH (ref 0.61–1.24)
GFR, Estimated: 14 mL/min — ABNORMAL LOW (ref >60)
Glucose, Bld: 105 mg/dL — ABNORMAL HIGH (ref 70–99)
Potassium: 3.9 mmol/L (ref 3.5–5.1)
Sodium: 139 mmol/L (ref 135–145)

## 2022-10-19 LAB — BRAIN NATRIURETIC PEPTIDE: B Natriuretic Peptide: 206.2 pg/mL — ABNORMAL HIGH (ref 0.0–100.0)

## 2022-10-23 ENCOUNTER — Other Ambulatory Visit: Payer: Self-pay

## 2022-10-28 ENCOUNTER — Ambulatory Visit
Admission: RE | Admit: 2022-10-28 | Discharge: 2022-10-28 | Disposition: A | Discharge status: Home / Self Care | Payer: Medicaid Other | Source: Ambulatory Visit | Attending: Urology | Admitting: Urology

## 2022-10-28 DIAGNOSIS — E278 Other specified disorders of adrenal gland: Secondary | ICD-10-CM

## 2022-10-28 MED ORDER — GADOPICLENOL 0.5 MMOL/ML IV SOLN
10.0000 mL | Freq: Once | INTRAVENOUS | Status: AC | PRN
  Administered 2022-10-28: 10 mL via INTRAVENOUS

## 2022-10-30 ENCOUNTER — Encounter: Payer: Self-pay | Admitting: Neurology

## 2022-10-30 ENCOUNTER — Ambulatory Visit (HOSPITAL_COMMUNITY): Payer: Medicaid Other

## 2022-10-31 ENCOUNTER — Encounter (HOSPITAL_COMMUNITY): Payer: Self-pay

## 2022-10-31 ENCOUNTER — Ambulatory Visit (HOSPITAL_COMMUNITY)
Admission: RE | Admit: 2022-10-31 | Discharge: 2022-10-31 | Disposition: A | Discharge status: Home / Self Care | Payer: Medicaid Other | Source: Ambulatory Visit | Attending: Adult Health | Admitting: Adult Health

## 2022-10-31 VITALS — BP 180/110 | HR 85 | Wt 206.8 lb

## 2022-10-31 DIAGNOSIS — Z7984 Long term (current) use of oral hypoglycemic drugs: Secondary | ICD-10-CM | POA: Diagnosis not present

## 2022-10-31 DIAGNOSIS — I5032 Chronic diastolic (congestive) heart failure: Secondary | ICD-10-CM | POA: Insufficient documentation

## 2022-10-31 DIAGNOSIS — G4733 Obstructive sleep apnea (adult) (pediatric): Secondary | ICD-10-CM | POA: Diagnosis not present

## 2022-10-31 DIAGNOSIS — Z7982 Long term (current) use of aspirin: Secondary | ICD-10-CM | POA: Diagnosis not present

## 2022-10-31 DIAGNOSIS — E1122 Type 2 diabetes mellitus with diabetic chronic kidney disease: Secondary | ICD-10-CM | POA: Insufficient documentation

## 2022-10-31 DIAGNOSIS — E785 Hyperlipidemia, unspecified: Secondary | ICD-10-CM | POA: Insufficient documentation

## 2022-10-31 DIAGNOSIS — J45909 Unspecified asthma, uncomplicated: Secondary | ICD-10-CM | POA: Insufficient documentation

## 2022-10-31 DIAGNOSIS — Z79899 Other long term (current) drug therapy: Secondary | ICD-10-CM | POA: Insufficient documentation

## 2022-10-31 DIAGNOSIS — N184 Chronic kidney disease, stage 4 (severe): Secondary | ICD-10-CM

## 2022-10-31 DIAGNOSIS — Z8249 Family history of ischemic heart disease and other diseases of the circulatory system: Secondary | ICD-10-CM | POA: Insufficient documentation

## 2022-10-31 DIAGNOSIS — I2510 Atherosclerotic heart disease of native coronary artery without angina pectoris: Secondary | ICD-10-CM | POA: Diagnosis not present

## 2022-10-31 DIAGNOSIS — G35 Multiple sclerosis: Secondary | ICD-10-CM | POA: Insufficient documentation

## 2022-10-31 DIAGNOSIS — E279 Disorder of adrenal gland, unspecified: Secondary | ICD-10-CM | POA: Insufficient documentation

## 2022-10-31 DIAGNOSIS — I219 Acute myocardial infarction, unspecified: Secondary | ICD-10-CM | POA: Diagnosis not present

## 2022-10-31 DIAGNOSIS — Z833 Family history of diabetes mellitus: Secondary | ICD-10-CM | POA: Diagnosis not present

## 2022-10-31 DIAGNOSIS — I130 Hypertensive heart and chronic kidney disease with heart failure and stage 1 through stage 4 chronic kidney disease, or unspecified chronic kidney disease: Secondary | ICD-10-CM | POA: Insufficient documentation

## 2022-10-31 DIAGNOSIS — Z5902 Unsheltered homelessness: Secondary | ICD-10-CM | POA: Insufficient documentation

## 2022-10-31 DIAGNOSIS — I10 Essential (primary) hypertension: Secondary | ICD-10-CM | POA: Diagnosis not present

## 2022-10-31 DIAGNOSIS — Z8673 Personal history of transient ischemic attack (TIA), and cerebral infarction without residual deficits: Secondary | ICD-10-CM | POA: Diagnosis not present

## 2022-10-31 DIAGNOSIS — I252 Old myocardial infarction: Secondary | ICD-10-CM | POA: Insufficient documentation

## 2022-10-31 LAB — BASIC METABOLIC PANEL
Anion gap: 8 (ref 5–15)
BUN: 37 mg/dL — ABNORMAL HIGH (ref 6–20)
CO2: 23 mmol/L (ref 22–32)
Calcium: 8.3 mg/dL — ABNORMAL LOW (ref 8.9–10.3)
Chloride: 107 mmol/L (ref 98–111)
Creatinine, Ser: 4.31 mg/dL — ABNORMAL HIGH (ref 0.61–1.24)
GFR, Estimated: 16 mL/min — ABNORMAL LOW (ref >60)
Glucose, Bld: 117 mg/dL — ABNORMAL HIGH (ref 70–99)
Potassium: 3.5 mmol/L (ref 3.5–5.1)
Sodium: 138 mmol/L (ref 135–145)

## 2022-10-31 MED ORDER — AMLODIPINE BESYLATE 10 MG PO TABS: 10.0000 mg | ORAL_TABLET | Freq: Every day | ORAL | 11 refills | Status: DC

## 2022-10-31 NOTE — Patient Instructions (Addendum)
Labs done today. We will contact you only if your labs are abnormal.  Please start Amlodipine '10mg'$  (1 tablet) by mouth daily.   No medication changes were made. Please continue all current medications as prescribed.  Your physician recommends that you schedule a follow-up appointment in: 3 weeks  If you have any questions or concerns before your next appointment please send [place] a message through [place] or call our office at [phone].    TO LEAVE A MESSAGE FOR THE NURSE SELECT OPTION 2, PLEASE LEAVE A MESSAGE INCLUDING: YOUR NAME DATE OF BIRTH CALL BACK NUMBER REASON FOR CALL**this is important as we prioritize the call backs  YOU WILL RECEIVE A CALL BACK THE SAME DAY AS LONG AS YOU CALL BEFORE 4:00 PM   Do the following things EVERYDAY: Weigh yourself in the morning before breakfast. Write it down and keep it in a log. Take your medicines as prescribed Eat low salt foods--Limit salt (sodium) to 2000 mg per day.  Stay as active as you can everyday Limit all fluids for the day to less than 2 liters   At the [facility], you and your health needs are our priority. As part of our continuing mission to provide you with exceptional heart care, we have created designated Provider Care Teams. These Care Teams include your primary Cardiologist (physician) and Advanced Practice Providers (APPs- Physician Assistants and Nurse Practitioners) who all work together to provide you with the care you need, when you need it.   You may see any of the following providers on your designated Care Team at your next follow up: Dr Glori Bickers Dr Haynes Kerns, NP Lyda Jester, [place] Audry Riles, PharmD   Please be sure to bring in all your medications bottles to every appointment.

## 2022-10-31 NOTE — Progress Notes (Signed)
HEART IMPACT TRANSITIONS OF CARE    PCP:  [facility].  Primary Cardiologist: Dr Gwenlyn Found   HPI: George Day is a 47 y.o. male with PMHx of CAD, DM2, CKD IV, adrenal mass, anemia, HTN, HLD, OSA on CPAP, asthma, MS and diastolic HF. Also has history of MI in 2010 with no stent, CVA 2010.    Admitted 12/21 with CP. HsTroponin flat not consistent with ACS. Echo showed EF 50 to 55%, no RWMA, grade 2 DD. Blood pressure was markedly elevated during this admission, borderline elevated troponin was felt to be related to high blood pressure. Renal artery duplex was normal. Started on BP medication and discharged home.   He had regular follow up with General Cardiology. Had chest pain and underwent Myoview (1/22) showed EF 42%, ST segment depression noted in lead II, III, aVF and V6, no evidence of ischemia or infarction on the Myoview perfusion study, intermediate risk study due to moderately reduced LVEF. Echo 12/21 showed normal EF, elected not to repeat echo.   Found to have R adrenal gland mass (DDx of old hematoma vs cystic neoplasm) in 2/22. He was referred by Endocrinology to Dr. Fredirick Maudlin for surgical excision.   Admitted 7/22 with HA, blurred vision and HTN urgency. BP was 213/126. Required ICU care and nicardipine gtt. Neurology consulted and MRI showed true cervical spinal cord lesions suggestive of demyelinating disease. He was started on steroid therapy with improvement in neuro symptoms. Drips weaned and placed on oral BP meds. Nephrology consulted with SCr 2.93>>3.4. He was discharged with Neurology and Nephrology follow up.   Admitted 12/23 with GIB. Underwent colonoscopy showing non-bleeding duodenal ulcer and a rectal polyp. Started on PPI and discharged with GI follow up. Admitted 1/24 with a/c dHF, had been off of his meds. BP was 235/127 on admission. He was diuresed with IV lasix and home BP meds restarted. Echo showed EF 50-55%, severe concentric LVH, RV  normal, moderate pericardial effusion. Nephrology consulted as his SCr 4.10 on admission, no indication for HD catheter placement. He was discharged home, weight 196 lbs.    He was seen in TOC last month. Volyme overloaded. Torsemide was increased to 80 mg daily and started on amlodipine. He was not able to do that because he lost housing.   Overall feeling fine. Denies SOB/PND/Orthopnea. Appetite ok. Eating fast food everyday. No fever or chills. Unable to weigh. Taking  all medications but did not pick up amlodipine. He plugs in CPAP when he can.l He is homeless with his wife and 5 children living in  his car.  He gets food stamps and his children's school helps with gas cards.    Family Hx: Mother: HF, deceased; Grandmother: "heart issue" DM and HTN, deceased   Cardiac Testing    - Echo (12/23): EF 50-55%, severe concentric LVH, RV normal, moderate pericardial effusion.    - Echo (12/21): EF 50 to 55%, no RWMA, grade 2 DD.   ROS: All systems negative except as listed in HPI, PMH and Problem List.  SH:  Social History   Socioeconomic History   Marital status: Married    Spouse name: Not on file   Number of children: 4   Years of education: Not on file   Highest education level: 10th grade  Occupational History   Occupation: Education administrator  Tobacco Use   Smoking status: Former    Packs/day: 0.50    Years: 31.00    Total pack  years: 15.50    Types: Cigars, E-cigarettes, Cigarettes    Quit date: 09/2021    Years since quitting: 1.1   Smokeless tobacco: Never  Vaping Use   Vaping Use: Never used  Substance and Sexual Activity   Alcohol use: Not Currently    Comment: occasioanlly    Drug use: Not Currently    Types: Marijuana   Sexual activity: Yes    Birth control/protection: None  Other Topics Concern   Not on file  Social History Narrative   Not on file   Social Determinants of Health   Financial Resource Strain: Medium Risk (09/20/2022)   Overall Financial  Resource Strain (CARDIA)    Difficulty of Paying Living Expenses: Somewhat hard  Food Insecurity: Food Insecurity Present (10/12/2022)   Hunger Vital Sign    Worried About Running Out of Food in the Last Year: Often true    Ran Out of Food in the Last Year: Often true  Transportation Needs: No Transportation Needs (09/17/2022)   PRAPARE - Hydrologist (Medical): No    Lack of Transportation (Non-Medical): No  Physical Activity: Not on file  Stress: Not on file  Social Connections: Not on file  Intimate Partner Violence: Not At Risk (09/17/2022)   Humiliation, Afraid, Rape, and Kick questionnaire    Fear of Current or Ex-Partner: No    Emotionally Abused: No    Physically Abused: No    Sexually Abused: No    FH:  Family History  Problem Relation Age of Onset   Diabetes Mother    Hyperlipidemia Mother    Diabetes Sister    Hyperlipidemia Sister    Hearing loss Sister    Diabetes Brother    Hyperlipidemia Brother     Past Medical History:  Diagnosis Date   Anemia 1998   Anginal pain ([place])    Anxiety    Asthma    Coronary artery disease    Diabetes mellitus without complication ([place]) [id number]   Hypertension 2019   MI (myocardial infarction) ([place]) 2010   Sleep apnea    Stroke ([place]) 2010    Current Outpatient Medications  Medication Sig Dispense Refill   albuterol (VENTOLIN HFA) 108 (90 Base) MCG/ACT inhaler Inhale 1-2 puffs into the lungs every 2 (two) hours as needed for wheezing or shortness of breath. 18 g 0   amLODipine (NORVASC) 10 MG tablet Take 1 tablet (10 mg total) by mouth daily. 30 tablet 11   aspirin EC 81 MG tablet Take 1 tablet (81 mg total) by mouth daily. 30 tablet 0   atorvastatin (LIPITOR) 40 MG tablet Take 1 tablet (40 mg total) by mouth daily. 30 tablet 0   carvedilol (COREG) 25 MG tablet Take 2 tablets (50 mg total) by mouth 2 (two) times daily. 120 tablet 0   docusate sodium (COLACE) 100 MG capsule Take 100 mg by mouth 2  (two) times daily.     famotidine (PEPCID) 20 MG tablet Take 20 mg by mouth daily.     Fluticasone-Umeclidin-Vilant (TRELEGY ELLIPTA) 100-62.5-25 MCG/ACT AEPB Inhale 1 puff into the lungs daily. 60 each 0   nitroGLYCERIN (NITROSTAT) 0.6 MG SL tablet Place 0.6 mg under the tongue every 5 (five) minutes as needed for chest pain.     potassium chloride SA (KLOR-CON M) 20 MEQ tablet Take 2 tablets (40 mEq total) by mouth daily. 60 tablet 2   torsemide (DEMADEX) 20 MG tablet Take 4 tablets (80 mg total) by mouth  daily. (Patient taking differently: Take 40 mg by mouth daily.) 120 tablet 6   No current facility-administered medications for this encounter.    Vitals:   10/31/22 1037  BP: (!) 180/110  Pulse: 85  SpO2: 99%  Weight: 93.8 kg (206 lb 12.8 oz)   Wt Readings from Last 3 Encounters:  10/31/22 93.8 kg (206 lb 12.8 oz)  10/12/22 96 kg (211 lb 9.6 oz)  10/12/22 96.3 kg (212 lb 3.2 oz)    PHYSICAL EXAM:  General:  Walked in the clinic.  No resp difficulty HEENT: normal Neck: supple. JVP flat. Carotids 2+ bilaterally; no bruits. No lymphadenopathy or thryomegaly appreciated. Cor: PMI normal. Regular rate & rhythm. No rubs, gallops or murmurs. Lungs: clear Abdomen: soft, nontender, nondistended. No hepatosplenomegaly. No bruits or masses. Good bowel sounds. Extremities: no cyanosis, clubbing, rash, R and LLE 1+edema Neuro: alert & orientedx3, cranial nerves grossly intact. Moves all 4 extremities w/o difficulty. Affect pleasant.   ASSESSMENT & PLAN: Chronic Diastolic Heart Failure - Echo (12/21): EF 50-55% - Myoview (1/22) showed EF 42%, ST segment depression noted in lead II, III, aVF and V6, no evidence of ischemia or infarction, intermediate risk study due to moderately reduced LVEF - SPEP and urine immunofixation negative 8/23.  - Echo (1/24): EF 50-55%, severe concentric LVH  - Consider cath sooner rather than later, esp as CKD progresses closer to HD - With severe LVH on  echo,? Amyloidosis, although more likely 2/2 HTN; obtain cMRI & send genetic testing for TTR. - NYHA II. Volume status mildly elevated . Continue torsemide at 40 mg daily.  - Continue Farxiga 10 mg daily. - Continue BiDil 2 tabs tid. - Continue Coreg 50 mg bid. -  Check BMET   CAD - MI in 2010, no intervention or stent. - Myoview 1/22 no evidence of infarction/ischemia - No chest pain.  - Continue ASA + statin + beta blocker.   HTN -Elevated but did not start amlodipine.  - Discussed. Send in amlodipine 10 mg daily.    CKD IV - avoid NSAIDs + hypotension - Re-refer back to [facility], likely nearly HD soon. - Baseline SCr 4. - Has follow up with Nephrology.    HLD - LDL 75 (10/23) - Continue statin.   6. Right adrenal mass - CT showed 7.4 cm mass, concern for malignancy - Planning robotic adrenalectomy 4/24   7. Multiple Sclerosis - Saw Neurology in [place], did not start meds. - Needs referral to MS.    Referred to HFSW for assistance with housing.  Follow up 3-4 weeks then back to general cardiology.   Dariel Pellecchia NP-C  12:50 PM

## 2022-10-31 NOTE — Addendum Note (Signed)
Encounter addended by: Domingo Sep, RN on: 10/31/2022 2:17 PM  Actions taken: Charge Capture section accepted

## 2022-11-01 ENCOUNTER — Other Ambulatory Visit: Payer: Self-pay

## 2022-11-01 ENCOUNTER — Telehealth (INDEPENDENT_AMBULATORY_CARE_PROVIDER_SITE_OTHER): Payer: Self-pay | Admitting: Primary Care

## 2022-11-01 ENCOUNTER — Ambulatory Visit (INDEPENDENT_AMBULATORY_CARE_PROVIDER_SITE_OTHER): Payer: Medicaid Other | Admitting: Primary Care

## 2022-11-01 NOTE — Telephone Encounter (Signed)
Attempted to reach patient about his missed appointment today. Left a message on his voicemail to call the office for rescheduling.

## 2022-11-01 NOTE — Progress Notes (Signed)
H&V Care Navigation CSW Progress Note  Clinical Social Worker met with George Day to discuss housing and provide resources..  George Day is participating in a Managed Medicaid Plan:  Yes  George Day is a 47 yo male who states he is currently homeless and staying in motels/living out of the car with his wife and 4 children. George Day was previously seen by HF Social Work team who explored options and confirmed that George Day and family are on the [facility] wait list and have worked with the [facility] but their are no programs available to them at this time. George Day shared that they have been able to stay overnight at the [facility] but have to leave in the morning. George Day's wife is currently working and the only source of income at this time. George Day is awaiting payment from [place] as he was informed of approval but has not started payment yet.   CSW provided list of available housing through the My Housing Search and provided to George Day who verbalizes understanding of follow up. CSW provided George Day with gift cards to obtain some heart healthy microwaveable foods as he states he is struggling to eat healthy. George Day states they do receive food stamps but very difficult and expensive to obtain heart healthy foods.  CSW available as needed. Raquel Sarna, LCSW, CCSW-MCS [phone]     SDOH Screenings   Food Insecurity: Food Insecurity Present (10/12/2022)  Housing: High Risk (10/12/2022)  Transportation Needs: No Transportation Needs (09/17/2022)  Utilities: Not At Risk (09/17/2022)  Alcohol Screen: Low Risk  (09/20/2022)  Depression (PHQ2-9): Low Risk  (11/28/2020)  Financial Resource Strain: Medium Risk (09/20/2022)  Tobacco Use: Medium Risk (10/31/2022)

## 2022-11-01 NOTE — Addendum Note (Signed)
Encounter addended by: Louann Liv, LCSW on: 11/01/2022 3:44 PM  Actions taken: Clinical Note Signed

## 2022-11-05 NOTE — Telephone Encounter (Signed)
Caller is following-up on patient's clearance.

## 2022-11-05 NOTE — Telephone Encounter (Signed)
Pt has been scheduled to see Jory Sims, NP, 11/16/22, clearance will be addressed at that time.  Have left a message on [facility]'s surgical clearance voicemail to make them aware.

## 2022-11-08 ENCOUNTER — Other Ambulatory Visit: Payer: Self-pay

## 2022-11-12 ENCOUNTER — Other Ambulatory Visit: Payer: Medicaid Other

## 2022-11-13 NOTE — Progress Notes (Deleted)
Cardiology Clinic Note   Patient Name: George Day Date of Encounter: 11/13/2022  Primary Care Provider:  Pcp, No Primary Cardiologist:  Quay Burow, MD  Patient Profile    47 year old male patient with known history of coronary artery disease, type 2 diabetes, chronic kidney disease IV, adrenal mass, anemia, hypertension, hyperlipidemia, OSA on CPAP, asthma, MS, CVA in 2010, MI in 2010 without intervention and diastolic CHF.    Seen last by Darrick Grinder, NP on 10/31/2022 in advance CHF clinic,  with continuation of guideline directed medical therapy to include Farxiga, BiDil, carvedilol and torsemide.  Was recommended the patient have a cardiac MRI and to evaluate for TTR.  The patient is scheduled for robotic and adrenalectomy on 12/08/2022.  He was seen last by general cardiology on 10/12/2022 by Coletta Memos, NP for preoperative evaluation and was found to be an acceptable risk to proceed with surgery.  Past Medical History    Past Medical History:  Diagnosis Date   Anemia 1998   Anginal pain ([place])    Anxiety    Asthma    Coronary artery disease    Diabetes mellitus without complication ([place]) Q000111Q   Hypertension 2019   MI (myocardial infarction) ([place]) 2010   Sleep apnea    Stroke [facility]) 2010   Past Surgical History:  Procedure Laterality Date   BIOPSY  08/18/2022   Procedure: BIOPSY;  Surgeon: Sharyn Creamer, MD;  Location: [place];  Service: Gastroenterology;;   CARDIAC CATHETERIZATION  2010   COLONOSCOPY WITH PROPOFOL N/A 08/18/2022   Procedure: COLONOSCOPY WITH PROPOFOL;  Surgeon: Sharyn Creamer, MD;  Location: [place];  Service: Gastroenterology;  Laterality: N/A;   ESOPHAGOGASTRODUODENOSCOPY (EGD) WITH PROPOFOL N/A 08/18/2022   Procedure: ESOPHAGOGASTRODUODENOSCOPY (EGD) WITH PROPOFOL;  Surgeon: Sharyn Creamer, MD;  Location: [place];  Service: Gastroenterology;  Laterality: N/A;   POLYPECTOMY  08/18/2022   Procedure: POLYPECTOMY;  Surgeon: Sharyn Creamer, MD;  Location: [facility] ENDOSCOPY;  Service: Gastroenterology;;    Allergies  Allergies  Allergen Reactions   Honey Bee Venom [Bee Venom] Anaphylaxis   Penicillins Anaphylaxis    ALL   Tomato Swelling    History of Present Illness    ***  Home Medications    Current Outpatient Medications  Medication Sig Dispense Refill   albuterol (VENTOLIN HFA) 108 (90 Base) MCG/ACT inhaler Inhale 1-2 puffs into the lungs every 2 (two) hours as needed for wheezing or shortness of breath. 18 g 0   amLODipine (NORVASC) 10 MG tablet Take 1 tablet (10 mg total) by mouth daily. 30 tablet 11   aspirin EC 81 MG tablet Take 1 tablet (81 mg total) by mouth daily. 30 tablet 0   atorvastatin (LIPITOR) 40 MG tablet Take 1 tablet (40 mg total) by mouth daily. 30 tablet 0   carvedilol (COREG) 25 MG tablet Take 2 tablets (50 mg total) by mouth 2 (two) times daily. 120 tablet 0   docusate sodium (COLACE) 100 MG capsule Take 100 mg by mouth 2 (two) times daily.     famotidine (PEPCID) 20 MG tablet Take 20 mg by mouth daily.     Fluticasone-Umeclidin-Vilant (TRELEGY ELLIPTA) 100-62.5-25 MCG/ACT AEPB Inhale 1 puff into the lungs daily. 60 each 0   nitroGLYCERIN (NITROSTAT) 0.6 MG SL tablet Place 0.6 mg under the tongue every 5 (five) minutes as needed for chest pain.     potassium chloride SA (KLOR-CON M) 20 MEQ tablet Take 2 tablets (40 mEq total) by mouth daily.  60 tablet 2   torsemide (DEMADEX) 20 MG tablet Take 4 tablets (80 mg total) by mouth daily. (Patient taking differently: Take 40 mg by mouth daily.) 120 tablet 6   No current facility-administered medications for this visit.     Family History    Family History  Problem Relation Age of Onset   Diabetes Mother    Hyperlipidemia Mother    Diabetes Sister    Hyperlipidemia Sister    Hearing loss Sister    Diabetes Brother    Hyperlipidemia Brother    He indicated that his mother is deceased. He indicated that his father is deceased. He indicated  that the status of his sister is unknown. He indicated that the status of his brother is unknown.  Social History    Social History   Socioeconomic History   Marital status: Married    Spouse name: Not on file   Number of children: 4   Years of education: Not on file   Highest education level: 10th grade  Occupational History   Occupation: Education administrator  Tobacco Use   Smoking status: Former    Packs/day: 0.50    Years: 31.00    Additional pack years: 0.00    Total pack years: 15.50    Types: Cigars, E-cigarettes, Cigarettes    Quit date: 09/2021    Years since quitting: 1.1   Smokeless tobacco: Never  Vaping Use   Vaping Use: Never used  Substance and Sexual Activity   Alcohol use: Not Currently    Comment: occasioanlly    Drug use: Not Currently    Types: Marijuana   Sexual activity: Yes    Birth control/protection: None  Other Topics Concern   Not on file  Social History Narrative   Not on file   Social Determinants of Health   Financial Resource Strain: Medium Risk (09/20/2022)   Overall Financial Resource Strain (CARDIA)    Difficulty of Paying Living Expenses: Somewhat hard  Food Insecurity: Food Insecurity Present (10/12/2022)   Hunger Vital Sign    Worried About Running Out of Food in the Last Year: Often true    Ran Out of Food in the Last Year: Often true  Transportation Needs: No Transportation Needs (09/17/2022)   PRAPARE - Hydrologist (Medical): No    Lack of Transportation (Non-Medical): No  Physical Activity: Not on file  Stress: Not on file  Social Connections: Not on file  Intimate Partner Violence: Not At Risk (09/17/2022)   Humiliation, Afraid, Rape, and Kick questionnaire    Fear of Current or Ex-Partner: No    Emotionally Abused: No    Physically Abused: No    Sexually Abused: No     Review of Systems    General:  No chills, fever, night sweats or weight changes.  Cardiovascular:  No chest pain,  dyspnea on exertion, edema, orthopnea, palpitations, paroxysmal nocturnal dyspnea. Dermatological: No rash, lesions/masses Respiratory: No cough, dyspnea Urologic: No hematuria, dysuria Abdominal:   No nausea, vomiting, diarrhea, bright red blood per rectum, melena, or hematemesis Neurologic:  No visual changes, wkns, changes in mental status. All other systems reviewed and are otherwise negative except as noted above.     Physical Exam    VS:  There were no vitals taken for this visit. , BMI There is no height or weight on file to calculate BMI.     GEN: Well nourished, well developed, in no acute distress. HEENT: normal. Neck:  Supple, no JVD, carotid bruits, or masses. Cardiac: RRR, no murmurs, rubs, or gallops. No clubbing, cyanosis, edema.  Radials/DP/PT 2+ and equal bilaterally.  Respiratory:  Respirations regular and unlabored, clear to auscultation bilaterally. GI: Soft, nontender, nondistended, BS + x 4. MS: no deformity or atrophy. Skin: warm and dry, no rash. Neuro:  Strength and sensation are intact. Psych: Normal affect.  Accessory Clinical Findings    ECG personally reviewed by me today- *** - No acute changes  Lab Results  Component Value Date   WBC 4.0 10/12/2022   HGB 7.9 (L) 10/12/2022   HCT 24.0 (L) 10/12/2022   MCV 68.2 (L) 10/12/2022   PLT 192 10/12/2022   Lab Results  Component Value Date   CREATININE 4.31 (H) 10/31/2022   BUN 37 (H) 10/31/2022   NA 138 10/31/2022   K 3.5 10/31/2022   CL 107 10/31/2022   CO2 23 10/31/2022   Lab Results  Component Value Date   ALT 36 08/16/2022   AST 30 08/16/2022   ALKPHOS 85 08/16/2022   BILITOT 0.9 08/16/2022   Lab Results  Component Value Date   CHOL 137 08/09/2020   HDL 44 08/09/2020   LDLCALC 71 08/09/2020   TRIG 110 08/09/2020   CHOLHDL 3.1 08/09/2020    Lab Results  Component Value Date   HGBA1C 5.9 (H) 02/19/2021    Review of Prior Studies: Echocardiogram 09/18/2022   IMPRESSIONS      1. Left ventricular ejection fraction, by estimation, is 50 to 55%. The  left ventricle has low normal function. The left ventricle has no regional  wall motion abnormalities. There is severe concentric left ventricular  hypertrophy. Indeterminate  diastolic filling due to E-A fusion.   2. Right ventricular systolic function is normal. The right ventricular  size is normal. Tricuspid regurgitation signal is inadequate for assessing  PA pressure.   3. Left atrial size was mildly dilated.   4. Moderate circumferential pericardial effusion, up to 1.9 cm posterior  to the LV. No signs of tamponade. Moderate pericardial effusion. The  pericardial effusion is circumferential. There is no evidence of cardiac  tamponade.   5. The mitral valve is grossly normal. Mild mitral valve regurgitation.  No evidence of mitral stenosis.   6. The aortic valve is tricuspid. Aortic valve regurgitation is not  visualized. No aortic stenosis is present.   7. The inferior vena cava is dilated in size with <50% respiratory  variability, suggesting right atrial pressure of 15 mmHg.   Comparison(s): Changes from prior study are noted.    NM Stress Test 09/14/2020 The left ventricular ejection fraction is moderately decreased (30-44%). Nuclear stress EF: 42%. ST segment depression was noted during stress in the II, III, aVF and V6 leads, and returning to baseline after 1-5 minutes of recovery. No evidence of ischemia or infarct on nuclear perfusion study. This is an intermediate risk study due to moderately reduced LVEF.  Assessment & Plan   1.  ***     {Are you ordering a CV Procedure (e.g. stress test, cath, DCCV, TEE, etc)?   Press F2        :[id number]   Signed, Phill Myron. West Pugh, ANP, [place]   11/13/2022 3:30 PM      Office [phone] Fax [phone]  Notice: This dictation was prepared with Dragon dictation along with smaller phrase technology. Any transcriptional errors that result from  this process are unintentional and may not be corrected upon review.

## 2022-11-14 NOTE — Progress Notes (Deleted)
Initial neurology clinic note  George Day MRN: [id number] DOB: 02-01-1976  Referring provider: Rafael Bihari, FNP  Primary care provider: Pcp, No  Reason for consult:  multiple sclerosis  Subjective:  This is [name], a 47 y.o. ***-handed male with a medical history of HTN, HLD, DM, ***stroke? (2010), CAD c/b MI, CKD stage IV, asthma, and anxiety*** who presents to neurology clinic with ***. The patient is accompanied by ***.  ***Admitted 03/10/22 with HA, blurry vision, and left sided numbness in the setting of HTN (BP 213/126). MRI showed cervical spinal cord lesions concerning for MS. He was started on IV steroids with improvement with symptoms.  Previously seen by neurology in [place]  History of stroke?  MEDICATIONS:  Outpatient Encounter Medications as of 11/22/2022  Medication Sig   albuterol (VENTOLIN HFA) 108 (90 Base) MCG/ACT inhaler Inhale 1-2 puffs into the lungs every 2 (two) hours as needed for wheezing or shortness of breath.   amLODipine (NORVASC) 10 MG tablet Take 1 tablet (10 mg total) by mouth daily.   aspirin EC 81 MG tablet Take 1 tablet (81 mg total) by mouth daily.   atorvastatin (LIPITOR) 40 MG tablet Take 1 tablet (40 mg total) by mouth daily.   carvedilol (COREG) 25 MG tablet Take 2 tablets (50 mg total) by mouth 2 (two) times daily.   docusate sodium (COLACE) 100 MG capsule Take 100 mg by mouth 2 (two) times daily.   famotidine (PEPCID) 20 MG tablet Take 20 mg by mouth daily.   Fluticasone-Umeclidin-Vilant (TRELEGY ELLIPTA) 100-62.5-25 MCG/ACT AEPB Inhale 1 puff into the lungs daily.   nitroGLYCERIN (NITROSTAT) 0.6 MG SL tablet Place 0.6 mg under the tongue every 5 (five) minutes as needed for chest pain.   potassium chloride SA (KLOR-CON M) 20 MEQ tablet Take 2 tablets (40 mEq total) by mouth daily.   torsemide (DEMADEX) 20 MG tablet Take 4 tablets (80 mg total) by mouth daily. (Patient taking differently: Take 40 mg by mouth daily.)   No  facility-administered encounter medications on file as of 11/22/2022.    PAST MEDICAL HISTORY: Past Medical History:  Diagnosis Date   Anemia 1998   Anginal pain ([place])    Anxiety    Asthma    Coronary artery disease    Diabetes mellitus without complication ([place]) Q000111Q   Hypertension 2019   MI (myocardial infarction) ([place]) 2010   Sleep apnea    Stroke [facility]) 2010    PAST SURGICAL HISTORY: Past Surgical History:  Procedure Laterality Date   BIOPSY  08/18/2022   Procedure: BIOPSY;  Surgeon: Sharyn Creamer, MD;  Location: [facility] ENDOSCOPY;  Service: Gastroenterology;;   CARDIAC CATHETERIZATION  2010   COLONOSCOPY WITH PROPOFOL N/A 08/18/2022   Procedure: COLONOSCOPY WITH PROPOFOL;  Surgeon: Sharyn Creamer, MD;  Location: [place];  Service: Gastroenterology;  Laterality: N/A;   ESOPHAGOGASTRODUODENOSCOPY (EGD) WITH PROPOFOL N/A 08/18/2022   Procedure: ESOPHAGOGASTRODUODENOSCOPY (EGD) WITH PROPOFOL;  Surgeon: Sharyn Creamer, MD;  Location: [place];  Service: Gastroenterology;  Laterality: N/A;   POLYPECTOMY  08/18/2022   Procedure: POLYPECTOMY;  Surgeon: Sharyn Creamer, MD;  Location: [facility] ENDOSCOPY;  Service: Gastroenterology;;    ALLERGIES: Allergies  Allergen Reactions   Honey Bee Venom [Bee Venom] Anaphylaxis   Penicillins Anaphylaxis    ALL   Tomato Swelling    FAMILY HISTORY: Family History  Problem Relation Age of Onset   Diabetes Mother    Hyperlipidemia Mother    Diabetes Sister  Hyperlipidemia Sister    Hearing loss Sister    Diabetes Brother    Hyperlipidemia Brother     SOCIAL HISTORY: Social History   Tobacco Use   Smoking status: Former    Packs/day: 0.50    Years: 31.00    Additional pack years: 0.00    Total pack years: 15.50    Types: Cigars, E-cigarettes, Cigarettes    Quit date: 09/2021    Years since quitting: 1.1   Smokeless tobacco: Never  Vaping Use   Vaping Use: Never used  Substance Use Topics   Alcohol use: Not Currently     Comment: occasioanlly    Drug use: Not Currently    Types: Marijuana   Social History   Social History Narrative   Not on file    Objective:  Vital Signs:  There were no vitals taken for this visit.  ***  Labs and Imaging review: Internal labs: Lab Results  Component Value Date   HGBA1C 5.9 (H) 02/19/2021   Lab Results  Component Value Date   [id number] 08/17/2022   Lab Results  Component Value Date   TSH 0.482 02/23/2021   No results found for: "ESRSEDRATE", "POCTSEDRATE"  BMP (10/31/22): Cr 4.31, BUN 37  PTH (09/19/22): elevated at 119  HIV (08/16/22): Non-reactive  Vit D (02/23/21): 17.58 NMO-IgG (02/19/21): < 1.5   External labs: ***  Imaging: CT head wo contrast (02/19/21): FINDINGS: Brain: Mild streak artifact in the anterior cranial fossa. No midline shift, ventriculomegaly, mass effect, evidence of mass lesion, intracranial hemorrhage or evidence of cortically based acute infarction. Occasional foci of white matter hypodensity (anterior right frontal lobe series 2, image 19). Otherwise preserved gray-white matter differentiation.   Vascular: No suspicious intracranial vascular hyperdensity.   Skull: Chronic right lamina papyracea fracture. No acute osseous abnormality identified.   Sinuses/Orbits: Visualized paranasal sinuses and mastoids are clear. Tympanic cavities are clear.   Other: No acute orbit or scalp soft tissue finding.   IMPRESSION: 1. No acute intracranial abnormality. 2. Scattered mild nonspecific white matter changes, most commonly due to chronic small vessel disease. 3. Chronic right lamina papyracea fracture.  MRI brain wo contrast (02/19/21 - images not available for review): FINDINGS: Brain: Normal cerebral volume. No restricted diffusion to suggest acute infarction. No midline shift, mass effect, evidence of mass lesion, ventriculomegaly, extra-axial collection or acute intracranial hemorrhage. Cervicomedullary junction  and pituitary are within normal limits.   Moderately advanced and patchy bilateral cerebral white matter T2 and FLAIR hyperintensity, including some nodular periventricular and subcortical areas of involvement such as on series 11 image 17. Diffusion is facilitated. No direct corpus callosum involvement. No chronic cerebral blood products. No cortical encephalomalacia. The deep gray matter nuclei, brainstem and cerebellum appear to remain normal.   Vascular: Major intracranial vascular flow voids are preserved. The right vertebral artery appears mildly dominant.   Skull and upper cervical spine: Cervical spine is reported separately today. Visualized bone marrow signal is within normal limits.   Sinuses/Orbits: Normal suprasellar cistern and optic chiasm. Orbits soft tissues appears symmetric and within normal limits.   Chronic right lamina papyracea fracture. Paranasal sinuses and mastoids are stable and well aerated.   Other: Grossly normal visible internal auditory structures. Negative visible scalp and face.   IMPRESSION: 1. No acute intracranial abnormality. 2. Moderate but nonspecific abnormal cerebral white matter signal. Top differential considerations include accelerated small vessel ischemia, demyelination, sequelae hypercoagulable state, vasculitis, or prior infection. 3. See also Cervical Spine MRI today  reported separately.  MRI cervical spine wo contrast (02/19/21): FINDINGS: Alignment: Preserved cervical lordosis.   Vertebrae: No marrow edema or evidence of acute osseous abnormality. Visualized bone marrow signal is within normal limits.   Cord: Abnormal. There is a small discrete right dorsal and lateral spinal cord T2 and STIR hyperintense lesion at the C2 cord level (series 7, image 6 and series 8, image 7). And there is a larger, confluent right lateral cord lesion at the C5 level (series 8, image 22) which is about 1 vertebral body in length (series 7,  image 7). No cord expansion is associated with these lesions. And elsewhere the visible cervical and upper thoracic cord signal and morphology remain normal.   Posterior Fossa, vertebral arteries, paraspinal tissues: Cervicomedullary junction is within normal limits. Brain is reported separately today. Preserved major vascular flow voids in the neck. Mildly dominant right vertebral artery. Negative visible neck soft tissues and lung apices.   Disc levels:   Mild congenital spinal canal narrowing related to short pedicle distance. Superimposed degenerative changes:   C2-C3:  Negative.   C3-C4:  Minimal disc bulging.   C4-C5: Mild disc bulging and posterior element hypertrophy. Mild overall spinal stenosis. Mild bilateral C5 neural foraminal stenosis.   C5-C6:  Negative.   C6-C7: Disc space loss. Mild circumferential disc bulge eccentric to the left. Mild spinal stenosis and ventral cord mass effect. Severe left and moderate right C7 foraminal stenosis.   C7-T1: Mild facet and endplate hypertrophy. No spinal stenosis. Mild bilateral C8 foraminal stenosis.   No visible upper thoracic spinal stenosis.   IMPRESSION: 1. Positive for two discrete cervical spinal cord lesions, both involving the right hemicord and the larger at C5. No cord expansion. No corresponding spinal stenosis at these levels. In light of the Brain findings today, Demyelinating Disease may be most likely. Other inflammatory conditions are possible. The appearance does NOT suggest cord tumor.   2. Some underlying congenital spinal canal narrowing but relatively mild superimposed degenerative disease. There is mild spinal stenosis at C4-C5 and C6-C7, with mild cord mass effect but no cord signal abnormality at the latter. Severe left and moderate right C7 neural foraminal stenosis.  MRI orbits wo contrast (02/20/21): FINDINGS: Study is mildly degraded by motion artifact.   Orbits: Capacious suprasellar  cistern. Optic chiasm appears stable and within normal limits. Symmetric and normal noncontrast appearance of the cavernous sinuses. There is no asymmetric or abnormal T2 hyperintensity identified in the substance of the optic nerves. No evidence of intraorbital mass or inflammation on these noncontrast images. Symmetric superior ophthalmic veins. Globes appear stable since yesterday, within normal limits. Visualized bone marrow signal is within normal limits.   Visualized sinuses: Visualized paranasal sinuses and mastoids are stable and well aerated.   Soft tissues: Visible superficial and deep soft tissue spaces of the face appear symmetric and negative.   Limited intracranial: Stable compared to the brain MRI yesterday.   IMPRESSION: Mildly motion degraded but essentially negative noncontrast MRI appearance of the orbits.  MRI thoracic spine (02/20/21): FINDINGS: Limited cervical spine imaging:  Reported yesterday.   Thoracic spine segmentation:  Appears to be normal.   Alignment: Mildly exaggerated upper thoracic kyphosis but no spondylolisthesis.   Vertebrae: No marrow edema or evidence of acute osseous abnormality. Visualized bone marrow signal is within normal limits.   Cord: Abnormal. More conspicuous today on sagittal T2 imaging (series 3, image 10) but apparent on sagittal STIR (series 4, image 10) and probably present in retrospect  on series 7, image 7 yesterday is a patchy roughly 1 vertebral body length T2 and STIR hyperintense lesion within the upper thoracic cord. Little if any associated cord expansion.   But there appear to be at least two other small T2 and STIR hyperintense lesions within the thoracic spine cord at T4-T5 and at T9 (also series 4, image 10). Axial spinal cord detail is limited due to motion. Cord volume remains normal. The conus medullaris is below T12-L1 and not identified.   Paraspinal and other soft tissues: Known bilobed T2  hyperintense right adrenal mass (series 6, image 37).   Visible thoracic viscera and thoracic paraspinal soft tissues appear normal.   Disc levels:   There is perhaps mild congenital thoracic spinal canal narrowing due to short pedicle distance. And there is a mild degree of thoracic epidural lipomatosis. But thoracic spinal cord disc signal and morphology remain normal at all levels. No disc herniation or significant spinal stenosis. Occasional mild thoracic facet hypertrophy (such as T7-T8 greater on the right series 6, image 23).   IMPRESSION: 1. Positive for a patchy, one vertebral body length T2/STIR hyperintense upper thoracic spinal cord lesion at T1, probably subtle but present on the Cervical STIR imaging yesterday. Little if any associated cord expansion. And at least two other small but similar thoracic cord lesions are identified. The constellation of brain and spinal cord findings favors demyelinating disease.   2. Borderline to mild generalized thoracic spinal stenosis related to short pedicle distance and mild epidural lipomatosis. But no thoracic disc degeneration. Intermittent mild thoracic facet hypertrophy.   3. Known Right Adrenal Mass is partially visible, T2 hyperintense. ***  Assessment/Plan:  George Day is a 47 y.o. male who presents for evaluation of ***. *** has a relevant medical history of ***. *** neurological examination is pertinent for ***. Available diagnostic data is significant for ***. This constellation of symptoms and objective data would most likely localize to ***. ***  PLAN: -Blood work: ***vit D, Hep panel, HIV, VZV, quantiferon TB, JC virus, immunoglobulins, CBC w/ diff, CMP, ***ANA, ENA*** -Repeat MRI brain, cervical spine, and thoracic spine w/wo contrast***  -Return to clinic ***  The impression above as well as the plan as outlined below were extensively discussed with the patient (in the company of ***) who voiced  understanding. All questions were answered to their satisfaction.  The patient was counseled on pertinent fall precautions per the printed material provided today, and as noted under the "Patient Instructions" section below.***  When available, results of the above investigations and possible further recommendations will be communicated to the patient via telephone/MyChart. Patient to call office if not contacted after expected testing turnaround time.   Total time spent reviewing records, interview, history/exam, documentation, and coordination of care on day of encounter:  *** min   Thank you for allowing me to participate in patient's care.  If I can answer any additional questions, I would be pleased to do so.  Kai Levins, MD   CC: Pcp, No No address on file  CC: Referring provider: Rafael Bihari, FNP [address],  [place] [address]

## 2022-11-16 ENCOUNTER — Ambulatory Visit: Payer: Medicaid Other | Admitting: Adult Health

## 2022-11-19 NOTE — Progress Notes (Deleted)
Cardiology Clinic Note   Patient Name: George Day Date of Encounter: 11/19/2022  Primary Care Provider:  Pcp, No Primary Cardiologist:  Quay Burow, MD  Patient Profile     George Day 47 year old male presents the clinic today for follow-up evaluation of his hypertension, preoperative cardiac evaluation, and coronary artery disease.  Past Medical History    Past Medical History:  Diagnosis Date   Anemia 1998   Anginal pain ([place])    Anxiety    Asthma    Coronary artery disease    Diabetes mellitus without complication ([place]) [id number]   Hypertension 2019   MI (myocardial infarction) ([place]) 2010   Sleep apnea    Stroke [facility]) 2010   Past Surgical History:  Procedure Laterality Date   BIOPSY  08/18/2022   Procedure: BIOPSY;  Surgeon: Sharyn Creamer, MD;  Location: [place];  Service: Gastroenterology;;   CARDIAC CATHETERIZATION  2010   COLONOSCOPY WITH PROPOFOL N/A 08/18/2022   Procedure: COLONOSCOPY WITH PROPOFOL;  Surgeon: Sharyn Creamer, MD;  Location: [place];  Service: Gastroenterology;  Laterality: N/A;   ESOPHAGOGASTRODUODENOSCOPY (EGD) WITH PROPOFOL N/A 08/18/2022   Procedure: ESOPHAGOGASTRODUODENOSCOPY (EGD) WITH PROPOFOL;  Surgeon: Sharyn Creamer, MD;  Location: [place];  Service: Gastroenterology;  Laterality: N/A;   POLYPECTOMY  08/18/2022   Procedure: POLYPECTOMY;  Surgeon: Sharyn Creamer, MD;  Location[facility] ENDOSCOPY;  Service: Gastroenterology;;    Allergies  Allergies  Allergen Reactions   Honey Bee Venom [Bee Venom] Anaphylaxis   Penicillins Anaphylaxis    ALL   Tomato Swelling    History of Present Illness     George Day has a PMH of hypertension, CAD, HLD, pericardial effusion, diabetes, CKD stage IV, homelessness, tobacco dependence, CVA, obesity, generalized anxiety disorder, anasarca, and anemia.  He was seen in follow-up by Dr. Gwenlyn Found on 11/15/2020.  He reported that he relocated from [facility] to [place].  He was  accompanied by his wife.  He had been initially seen 12/21 during hospitalization.  He had renal Doppler studies that did not show evidence of renal artery stenosis.  His echocardiogram showed low normal EF with moderate concentric LVH.  Nuclear stress test showed low risk without significant ischemia.  He denied chest pain since hospitalization.  He did note occasional shortness of breath which was improved with his inhalers.  He was intolerant of antihypertensive medication.  He presented to the clinic 10/12/22 for follow-up evaluation and preoperative cardiac evaluation.  He stated he felt he was doing well except for his lower extremity swelling.  We reviewed his previous stress testing and echocardiogram.  He expressed understanding.  He presented with his wife.  We reviewed his upcoming surgery.  He was seen earlier that day by the advanced heart failure team.  At that time his torsemide was increased.  Labs were also planned.  I will continued his current medication regimen and planned for fasting lipids the next week.  I  asked him to continue heart healthy low-sodium diet, increase physical activity as tolerated and we will planned follow-up in 6 months.  He was seen in follow-up by the advanced heart failure team 10/31/2022.  He reported that he was not able to adhere to increased diuresis due to losing housing.  He reported that he was feeling fine.  He denied shortness of breath PND and orthopnea.  His appetite was appropriate.  He was eating fast food daily.  He was using his CPAP when he could.  He  reported being homeless with his wife and 5 children.  They were living in their car.  He was using food stamps and his children's school was helping with gas.  Cardiac MRI was planned as well as genetic testing for TTR.  His torsemide 40 mg daily, Farxiga, BiDil, and carvedilol were continued.  His lab work remained stable.  He presents to the clinic today for follow-up evaluation and  states***.  Today denies chest pain, shortness of breath, lower extremity edema, fatigue, palpitations, melena, hematuria, hemoptysis, diaphoresis, weakness, presyncop, and syncope.   Essential hypertension-BP today ***122/72. Continue carvedilol,  torsemide, amlodipine Heart healthy low-sodium diet Increase physical activity as tolerated   Coronary artery disease-no chest pain today.  Denies recent episodes of chest discomfort.  Underwent nuclear stress test which showed low risk and no significant ischemia. Continue aspirin, amlodipine, carvedilol Heart healthy low-sodium diet Increase physical activity as tolerated   Hyperlipidemia-LDL 71 08/10/23 Low-sodium high-fiber diet Continue aspirin, atorvastatin  Chronic diastolic CHF-no increased work of breathing today.  Echocardiogram 1221 showed EF 50-55% and G2 DD.  NYHA class III.  With 1-2+ bilateral lower extremity edema.  During last visit with advanced heart failure team plan was made for cardiac MRI and TTR Continue current medication regimen Follow-up with advanced heart failure team.  Preoperative cardiac evaluation-robotic assisted adrenalectomy, Dr. Ellison Hughs    Primary Cardiologist: Quay Burow, MD  Chart reviewed as part of pre-operative protocol coverage. Given past medical history and time since last visit, based on ACC/AHA guidelines, George Day would be at acceptable risk for the planned procedure without further cardiovascular testing.   His RCRI is a class II risk, 0.9% risk of major cardiac event.  He is able to complete greater than 4 METS of physical activity.  His aspirin is prescribed by a non cardiology provider.  Recommendations for holding aspirin will need to come from prescribing provider.  Patient was advised that if he develops new symptoms prior to surgery to contact our office to arrange a follow-up appointment.  He verbalized understanding.  I will route this recommendation to the  requesting party via Epic fax function and remove from pre-op pool.    Position: Follow-up with Dr.Berry in 6 months.  Home Medications    Prior to Admission medications   Medication Sig Start Date End Date Taking? Authorizing Provider  albuterol (VENTOLIN HFA) 108 (90 Base) MCG/ACT inhaler Inhale 1-2 puffs into the lungs every 2 (two) hours as needed for wheezing or shortness of breath. 09/20/22   Gerrit Heck, MD  aspirin EC 81 MG tablet Take 1 tablet (81 mg total) by mouth daily. 09/20/22   Gerrit Heck, MD  atorvastatin (LIPITOR) 40 MG tablet Take 1 tablet (40 mg total) by mouth daily. 09/20/22   Gerrit Heck, MD  carvedilol (COREG) 25 MG tablet Take 2 tablets (50 mg total) by mouth 2 (two) times daily. 09/20/22   Gerrit Heck, MD  docusate sodium (COLACE) 100 MG capsule Take 100 mg by mouth 2 (two) times daily.    [provider]  famotidine (PEPCID) 20 MG tablet Take 20 mg by mouth daily.    [provider]  ferrous sulfate 325 (65 FE) MG tablet Take 1 tablet (325 mg total) by mouth daily with breakfast. 08/19/22   Danford, Suann Larry, MD  Fluticasone-Umeclidin-Vilant (TRELEGY ELLIPTA) 100-62.5-25 MCG/ACT AEPB Inhale 1 puff into the lungs daily. 09/20/22   Gerrit Heck, MD  isosorbide-hydrALAZINE (BIDIL) 20-37.5 MG tablet Take 2 tablets by mouth 3 (  three) times daily. 09/20/22 10/20/22  Gerrit Heck, MD  NIFEdipine (ADALAT CC) 90 MG 24 hr tablet Take 1 tablet (90 mg total) by mouth daily. 09/20/22   Gerrit Heck, MD  nitroGLYCERIN (NITROSTAT) 0.6 MG SL tablet Place 0.6 mg under the tongue every 5 (five) minutes as needed for chest pain.    [provider]  pantoprazole (PROTONIX) 40 MG tablet Take 1 tablet (40 mg total) by mouth 2 (two) times daily. 08/18/22   Danford, Suann Larry, MD  torsemide (DEMADEX) 20 MG tablet Take 2 tablets (40 mg total) by mouth daily. 09/20/22   Gerrit Heck, MD  Vitamin D, Ergocalciferol, (DRISDOL) 1.25 MG (50000  UNIT) CAPS capsule Take 1 capsule (50,000 Units total) by mouth every 7 (seven) days. 03/03/21   Domenic Polite, MD    Family History    Family History  Problem Relation Age of Onset   Diabetes Mother    Hyperlipidemia Mother    Diabetes Sister    Hyperlipidemia Sister    Hearing loss Sister    Diabetes Brother    Hyperlipidemia Brother    He indicated that his mother is deceased. He indicated that his father is deceased. He indicated that the status of his sister is unknown. He indicated that the status of his brother is unknown.  Social History    Social History   Socioeconomic History   Marital status: Married    Spouse name: Not on file   Number of children: 4   Years of education: Not on file   Highest education level: 10th grade  Occupational History   Occupation: Education administrator  Tobacco Use   Smoking status: Former    Packs/day: 0.50    Years: 31.00    Additional pack years: 0.00    Total pack years: 15.50    Types: Cigars, E-cigarettes, Cigarettes    Quit date: 09/2021    Years since quitting: 1.1   Smokeless tobacco: Never  Vaping Use   Vaping Use: Never used  Substance and Sexual Activity   Alcohol use: Not Currently    Comment: occasioanlly    Drug use: Not Currently    Types: Marijuana   Sexual activity: Yes    Birth control/protection: None  Other Topics Concern   Not on file  Social History Narrative   Not on file   Social Determinants of Health   Financial Resource Strain: Medium Risk (09/20/2022)   Overall Financial Resource Strain (CARDIA)    Difficulty of Paying Living Expenses: Somewhat hard  Food Insecurity: Food Insecurity Present (10/12/2022)   Hunger Vital Sign    Worried About Running Out of Food in the Last Year: Often true    Ran Out of Food in the Last Year: Often true  Transportation Needs: No Transportation Needs (09/17/2022)   PRAPARE - Hydrologist (Medical): No    Lack of Transportation  (Non-Medical): No  Physical Activity: Not on file  Stress: Not on file  Social Connections: Not on file  Intimate Partner Violence: Not At Risk (09/17/2022)   Humiliation, Afraid, Rape, and Kick questionnaire    Fear of Current or Ex-Partner: No    Emotionally Abused: No    Physically Abused: No    Sexually Abused: No     Review of Systems    General:  No chills, fever, night sweats or weight changes.  Cardiovascular:  No chest pain, dyspnea on exertion, edema, orthopnea, palpitations, paroxysmal nocturnal dyspnea. Dermatological: No  rash, lesions/masses Respiratory: No cough, dyspnea Urologic: No hematuria, dysuria Abdominal:   No nausea, vomiting, diarrhea, bright red blood per rectum, melena, or hematemesis Neurologic:  No visual changes, wkns, changes in mental status. All other systems reviewed and are otherwise negative except as noted above.  Physical Exam    VS:  There were no vitals taken for this visit. , BMI There is no height or weight on file to calculate BMI. GEN: Well nourished, well developed, in no acute distress. HEENT: normal. Neck: Supple, no JVD, carotid bruits, or masses. Cardiac: RRR, systolic murmur 3/6 heard along left sternal border*** , rubs, or gallops. No clubbing, cyanosis, 1-2+ bilateral lower extremity edema.  Radials/DP/PT 2+ and equal bilaterally.  Respiratory:  Respirations regular and unlabored, clear to auscultation bilaterally. GI: Soft, nontender, nondistended, BS + x 4. MS: no deformity or atrophy. Skin: warm and dry, no rash. Neuro:  Strength and sensation are intact. Psych: Normal affect.  Accessory Clinical Findings    Recent Labs: 08/16/2022: ALT 36 09/19/2022: Magnesium 2.1 10/12/2022: Hemoglobin 7.9; Platelets 192 10/19/2022: B Natriuretic Peptide 206.2 10/31/2022: BUN 37; Creatinine, Ser 4.31; Potassium 3.5; Sodium 138   Recent Lipid Panel    Component Value Date/Time   CHOL 137 08/09/2020 0242   CHOL 181 01/05/2020 1515    TRIG 110 08/09/2020 0242   HDL 44 08/09/2020 0242   HDL 41 01/05/2020 1515   CHOLHDL 3.1 08/09/2020 0242   VLDL 22 08/09/2020 0242   LDLCALC 71 08/09/2020 0242   LDLCALC 111 (H) 01/05/2020 1515    No BP recorded.  {Refresh Note OR Click here to enter BP  :1}***    ECG personally reviewed by me today-none today.  Echocardiogram 09/18/2022  IMPRESSIONS     1. Left ventricular ejection fraction, by estimation, is 50 to 55%. The  left ventricle has low normal function. The left ventricle has no regional  wall motion abnormalities. There is severe concentric left ventricular  hypertrophy. Indeterminate  diastolic filling due to E-A fusion.   2. Right ventricular systolic function is normal. The right ventricular  size is normal. Tricuspid regurgitation signal is inadequate for assessing  PA pressure.   3. Left atrial size was mildly dilated.   4. Moderate circumferential pericardial effusion, up to 1.9 cm posterior  to the LV. No signs of tamponade. Moderate pericardial effusion. The  pericardial effusion is circumferential. There is no evidence of cardiac  tamponade.   5. The mitral valve is grossly normal. Mild mitral valve regurgitation.  No evidence of mitral stenosis.   6. The aortic valve is tricuspid. Aortic valve regurgitation is not  visualized. No aortic stenosis is present.   7. The inferior vena cava is dilated in size with <50% respiratory  variability, suggesting right atrial pressure of 15 mmHg.   Comparison(s): Changes from prior study are noted.   FINDINGS   Left Ventricle: Left ventricular ejection fraction, by estimation, is 50  to 55%. The left ventricle has low normal function. The left ventricle has  no regional wall motion abnormalities. Definity contrast agent was given  IV to delineate the left  ventricular endocardial borders. The left ventricular internal cavity size  was normal in size. There is severe concentric left ventricular  hypertrophy.  Indeterminate diastolic filling due to E-A fusion.   Right Ventricle: The right ventricular size is normal. No increase in  right ventricular wall thickness. Right ventricular systolic function is  normal. Tricuspid regurgitation signal is inadequate for assessing PA  pressure.   Left Atrium: Left atrial size was mildly dilated.   Right Atrium: Right atrial size was normal in size.   Pericardium: Moderate circumferential pericardial effusion, up to 1.9 cm  posterior to the LV. No signs of tamponade. A moderately sized pericardial  effusion is present. The pericardial effusion is circumferential. There is  no evidence of cardiac  tamponade.   Mitral Valve: The mitral valve is grossly normal. Mild mitral valve  regurgitation. No evidence of mitral valve stenosis.   Tricuspid Valve: The tricuspid valve is grossly normal. Tricuspid valve  regurgitation is trivial. No evidence of tricuspid stenosis.   Aortic Valve: The aortic valve is tricuspid. Aortic valve regurgitation is  not visualized. No aortic stenosis is present.   Pulmonic Valve: The pulmonic valve was grossly normal. Pulmonic valve  regurgitation is trivial. No evidence of pulmonic stenosis.   Aorta: The aortic root and ascending aorta are structurally normal, with  no evidence of dilitation.   Venous: The inferior vena cava is dilated in size with less than 50%  respiratory variability, suggesting right atrial pressure of 15 mmHg.   IAS/Shunts: The atrial septum is grossly normal.    Assessment & Plan   1.  ***   George Ng. Jac Romulus NP-C     11/19/2022, 4:52 PM [facility] [address] Office [phone] Fax [phone]    I spent 14 ***minutes examining this patient, reviewing medications, and using patient centered shared decision making involving her cardiac care.  Prior to her visit I spent greater than 20 minutes reviewing her past medical history,  medications,  and prior cardiac tests.

## 2022-11-21 ENCOUNTER — Ambulatory Visit (INDEPENDENT_AMBULATORY_CARE_PROVIDER_SITE_OTHER): Payer: Medicaid Other | Admitting: Family

## 2022-11-21 ENCOUNTER — Other Ambulatory Visit: Payer: Self-pay

## 2022-11-21 ENCOUNTER — Inpatient Hospital Stay (HOSPITAL_COMMUNITY)
Admission: RE | Admit: 2022-11-21 | Discharge: 2022-11-21 | Disposition: A | Discharge status: Home / Self Care | Payer: Medicaid Other | Source: Ambulatory Visit

## 2022-11-21 ENCOUNTER — Ambulatory Visit: Payer: Medicaid Other | Attending: Adult Health | Admitting: General Practice

## 2022-11-21 VITALS — BP 140/80 | HR 86 | Temp 97.7°F | Resp 18 | Ht 69.0 in | Wt 202.0 lb

## 2022-11-21 DIAGNOSIS — I5032 Chronic diastolic (congestive) heart failure: Secondary | ICD-10-CM

## 2022-11-21 DIAGNOSIS — E782 Mixed hyperlipidemia: Secondary | ICD-10-CM

## 2022-11-21 DIAGNOSIS — I25118 Atherosclerotic heart disease of native coronary artery with other forms of angina pectoris: Secondary | ICD-10-CM

## 2022-11-21 DIAGNOSIS — N184 Chronic kidney disease, stage 4 (severe): Secondary | ICD-10-CM

## 2022-11-21 DIAGNOSIS — I129 Hypertensive chronic kidney disease with stage 1 through stage 4 chronic kidney disease, or unspecified chronic kidney disease: Secondary | ICD-10-CM

## 2022-11-21 DIAGNOSIS — Z1159 Encounter for screening for other viral diseases: Secondary | ICD-10-CM

## 2022-11-21 DIAGNOSIS — Z7689 Persons encountering health services in other specified circumstances: Secondary | ICD-10-CM

## 2022-11-21 DIAGNOSIS — G35 Multiple sclerosis: Secondary | ICD-10-CM

## 2022-11-21 DIAGNOSIS — K219 Gastro-esophageal reflux disease without esophagitis: Secondary | ICD-10-CM

## 2022-11-21 DIAGNOSIS — L602 Onychogryphosis: Secondary | ICD-10-CM

## 2022-11-21 DIAGNOSIS — J4540 Moderate persistent asthma, uncomplicated: Secondary | ICD-10-CM

## 2022-11-21 DIAGNOSIS — Z87891 Personal history of nicotine dependence: Secondary | ICD-10-CM | POA: Diagnosis not present

## 2022-11-21 DIAGNOSIS — E1159 Type 2 diabetes mellitus with other circulatory complications: Secondary | ICD-10-CM

## 2022-11-21 DIAGNOSIS — K5901 Slow transit constipation: Secondary | ICD-10-CM

## 2022-11-21 MED ORDER — DOCUSATE SODIUM 100 MG PO CAPS
100.0000 mg | ORAL_CAPSULE | Freq: Two times a day (BID) | ORAL | 1 refills | Status: DC
  Filled 2022-11-21: qty 60, 30d supply, fill #0

## 2022-11-21 MED ORDER — FAMOTIDINE 20 MG PO TABS
20.0000 mg | ORAL_TABLET | Freq: Every day | ORAL | 1 refills | Status: DC
Start: 2022-11-21 — End: 2023-04-10
  Filled 2022-11-21: qty 90, 90d supply, fill #0

## 2022-11-21 MED ORDER — ASPIRIN 81 MG PO TBEC
81.0000 mg | DELAYED_RELEASE_TABLET | Freq: Every day | ORAL | 1 refills | Status: DC
  Filled 2022-11-21: qty 90, 90d supply, fill #0

## 2022-11-21 MED ORDER — TRELEGY ELLIPTA 100-62.5-25 MCG/ACT IN AEPB
1.0000 | INHALATION_SPRAY | Freq: Every day | RESPIRATORY_TRACT | 5 refills | Status: DC
  Filled 2022-11-21: qty 60, 30d supply, fill #0

## 2022-11-21 MED ORDER — NITROGLYCERIN 0.6 MG SL SUBL
0.6000 mg | SUBLINGUAL_TABLET | SUBLINGUAL | 5 refills | Status: AC | PRN
Start: 2022-11-21 — End: ?
  Filled 2022-11-21: qty 100, 30d supply, fill #0

## 2022-11-21 MED ORDER — TORSEMIDE 20 MG PO TABS
40.0000 mg | ORAL_TABLET | Freq: Every day | ORAL | 1 refills | Status: DC
Start: 2022-11-21 — End: 2023-02-15
  Filled 2022-11-21: qty 90, 45d supply, fill #0

## 2022-11-21 MED ORDER — POTASSIUM CHLORIDE CRYS ER 20 MEQ PO TBCR
40.0000 meq | EXTENDED_RELEASE_TABLET | Freq: Every day | ORAL | 2 refills | Status: DC
Start: 2022-11-21 — End: 2023-02-15
  Filled 2022-11-21: qty 60, 30d supply, fill #0

## 2022-11-21 MED ORDER — AMLODIPINE BESYLATE 10 MG PO TABS
10.0000 mg | ORAL_TABLET | Freq: Every day | ORAL | 1 refills | Status: DC
  Filled 2022-11-21: qty 90, 90d supply, fill #0

## 2022-11-21 MED ORDER — CARVEDILOL 25 MG PO TABS
50.0000 mg | ORAL_TABLET | Freq: Two times a day (BID) | ORAL | 1 refills | Status: DC
  Filled 2022-11-21: qty 360, 90d supply, fill #0

## 2022-11-21 MED ORDER — ATORVASTATIN CALCIUM 40 MG PO TABS
40.0000 mg | ORAL_TABLET | Freq: Every day | ORAL | 1 refills | Status: DC
  Filled 2022-11-21: qty 90, 90d supply, fill #0

## 2022-11-21 NOTE — Progress Notes (Signed)
Provider: Webb Silversmith Shealyn Sean FNP-C   Pcp, No  Patient Care Team: Pcp, No as PCP - General Lorretta Harp, MD as PCP - Cardiology (Cardiology)  Extended Emergency Contact Information Primary Emergency Contact: Moore,Shanaira Mobile Phone: [phone] Relation: Significant other  Code Status:  Full Code  Goals of care: Advanced Directive information    09/17/2022   11:00 PM  Advanced Directives  Does Patient Have a Medical Advance Directive? No  Would patient like information on creating a medical advance directive? Yes (Inpatient - patient requests chaplain consult to create a medical advance directive)     Chief Complaint  Patient presents with   Establish Care    New Patient.    HPI:  Pt is a 47 y.o. male seen today establish care here at [facility] and Adult  care for medical management of chronic diseases.He is here with his two year old daughter.Has a medical history of Hypertension with stage 4 chronic Kidney disease,type 2 DM,Multiple sclerosis,Chronic diastolic Heart Failure, GERD,CAD,Former Cigarette smoker,former marijuana use among others.  States relocated from [place] in [place] was living in a car with family but recently found an apartment to stay. Has not established with Neurologist for multiple sclerosis.states right leg feels heavy,numb and weak from Multiple sclerosis. No history of MS in the family.Has x 4 siblings who are healthy. Patient poor historian falls asleep on and off during conversation.denies any use of edible.states woke up early to take wife to work then had to drop the kids to school and care for the two year old.will go home and take a nap when the child sleeps.  No fall episode. Has upcoming procedure on 12/07/2022 for XI Roboti adrenalectomy with urologist Dr.Winter Harrell Gave.   Smoked 2 packs of cigarette per day since he was 47 yrs old.Quit February,2023. Also used to smoke marijuana but quit due to heart problems.Now chews  the edibles.   Past Medical History:  Diagnosis Date   Anemia 1998   Anginal pain ([place])    Anxiety    Asthma    Coronary artery disease    Diabetes mellitus without complication ([place]) [id number]   Hypertension 2019   MI (myocardial infarction) ([place]) 2010   Sleep apnea    Stroke [facility]) 2010   Past Surgical History:  Procedure Laterality Date   BIOPSY  08/18/2022   Procedure: BIOPSY;  Surgeon: Sharyn Creamer, MD;  Location: [facility] ENDOSCOPY;  Service: Gastroenterology;;   CARDIAC CATHETERIZATION  2010   COLONOSCOPY WITH PROPOFOL N/A 08/18/2022   Procedure: COLONOSCOPY WITH PROPOFOL;  Surgeon: Sharyn Creamer, MD;  Location: [place];  Service: Gastroenterology;  Laterality: N/A;   ESOPHAGOGASTRODUODENOSCOPY (EGD) WITH PROPOFOL N/A 08/18/2022   Procedure: ESOPHAGOGASTRODUODENOSCOPY (EGD) WITH PROPOFOL;  Surgeon: Sharyn Creamer, MD;  Location: [place];  Service: Gastroenterology;  Laterality: N/A;   POLYPECTOMY  08/18/2022   Procedure: POLYPECTOMY;  Surgeon: Sharyn Creamer, MD;  Location: [facility] ENDOSCOPY;  Service: Gastroenterology;;    Allergies  Allergen Reactions   Honey Bee Venom [Bee Venom] Anaphylaxis   Penicillins Anaphylaxis    ALL   Tomato Swelling    Allergies as of 11/21/2022       Reactions   Honey Bee Venom [bee Venom] Anaphylaxis   Penicillins Anaphylaxis   ALL   Tomato Swelling        Medication List        Accurate as of November 21, 2022 11:23 AM. If you have any questions, ask your nurse or  doctor.          amLODipine 10 MG tablet Commonly known as: NORVASC Take 1 tablet (10 mg total) by mouth daily.   Aspirin Low Dose 81 MG tablet Generic drug: aspirin EC Take 1 tablet (81 mg total) by mouth daily.   atorvastatin 40 MG tablet Commonly known as: LIPITOR Take 1 tablet (40 mg total) by mouth daily.   carvedilol 25 MG tablet Commonly known as: COREG Take 2 tablets (50 mg total) by mouth 2 (two) times daily.   docusate sodium 100 MG  capsule Commonly known as: COLACE Take 100 mg by mouth 2 (two) times daily.   famotidine 20 MG tablet Commonly known as: PEPCID Take 20 mg by mouth daily.   nitroGLYCERIN 0.6 MG SL tablet Commonly known as: NITROSTAT Place 0.6 mg under the tongue every 5 (five) minutes as needed for chest pain.   potassium chloride SA 20 MEQ tablet Commonly known as: KLOR-CON M Take 2 tablets (40 mEq total) by mouth daily.   torsemide 20 MG tablet Commonly known as: DEMADEX Take 4 tablets (80 mg total) by mouth daily. What changed: how much to take   Trelegy Ellipta 100-62.5-25 MCG/ACT Aepb Generic drug: Fluticasone-Umeclidin-Vilant Inhale 1 puff into the lungs daily.   Ventolin HFA 108 (90 Base) MCG/ACT inhaler Generic drug: albuterol Inhale 1-2 puffs into the lungs every 2 (two) hours as needed for wheezing or shortness of breath.        Review of Systems  Constitutional:  Negative for appetite change, chills, fatigue, fever and unexpected weight change.  HENT:  Negative for congestion, dental problem, ear discharge, ear pain, hearing loss, nosebleeds, postnasal drip, rhinorrhea, sinus pressure, sinus pain, sneezing, sore throat, tinnitus and trouble swallowing.   Eyes:  Negative for pain, discharge, redness, itching and visual disturbance.  Respiratory:  Negative for cough, chest tightness, shortness of breath and wheezing.   Cardiovascular:  Negative for chest pain, palpitations and leg swelling.  Gastrointestinal:  Negative for abdominal distention, abdominal pain, blood in stool, diarrhea, nausea and vomiting.       Colace effective   Endocrine: Negative for cold intolerance, heat intolerance, polydipsia, polyphagia and polyuria.  Genitourinary:  Negative for difficulty urinating, dysuria, flank pain, frequency and urgency.  Musculoskeletal:  Positive for gait problem. Negative for arthralgias, back pain, joint swelling, myalgias, neck pain and neck stiffness.  Skin:  Negative for  color change, pallor, rash and wound.  Neurological:  Positive for weakness and numbness. Negative for dizziness, syncope, speech difficulty, light-headedness and headaches.       Right leg numbness and weakness due to multiple sclerosis   Hematological:  Does not bruise/bleed easily.  Psychiatric/Behavioral:  Negative for agitation, behavioral problems, confusion, hallucinations, self-injury, sleep disturbance and suicidal ideas. The patient is not nervous/anxious.     Immunization History  Administered Date(s) Administered   Moderna SARS-COV2 Booster Vaccination 02/23/2021   Tdap 11/28/2020   Pertinent  Health Maintenance Due  Topic Date Due   OPHTHALMOLOGY EXAM  10/19/2020   FOOT EXAM  01/04/2021   HEMOGLOBIN A1C  08/22/2021   INFLUENZA VACCINE  03/21/2023   COLONOSCOPY (Pts 45-67yrs Insurance coverage will need to be confirmed)  08/18/2032      08/17/2022    3:00 AM 08/17/2022    8:00 AM 08/17/2022   10:00 PM 08/18/2022    8:00 AM 08/18/2022    8:29 AM  Fall Risk  (RETIRED) Patient Fall Risk Level Low fall risk Low fall risk Moderate  fall risk Moderate fall risk High fall risk   Functional Status Survey:    Vitals:   11/21/22 1102  BP: (!) 140/80  Pulse: 86  Temp: 97.7 F (36.5 C)  SpO2: 96%  Weight: 202 lb (91.6 kg)   Body mass index is 29.83 kg/m. Physical Exam Vitals reviewed.  Constitutional:      General: He is not in acute distress.    Appearance: Normal appearance. He is overweight. He is not ill-appearing or diaphoretic.  HENT:     Head: Normocephalic.     Right Ear: Tympanic membrane, ear canal and external ear normal. There is no impacted cerumen.     Left Ear: Tympanic membrane, ear canal and external ear normal. There is no impacted cerumen.     Nose: Nose normal. No congestion or rhinorrhea.     Mouth/Throat:     Mouth: Mucous membranes are moist.     Pharynx: Oropharynx is clear. No oropharyngeal exudate or posterior oropharyngeal erythema.   Eyes:     General: No scleral icterus.       Right eye: No discharge.        Left eye: No discharge.     Extraocular Movements: Extraocular movements intact.     Conjunctiva/sclera: Conjunctivae normal.     Pupils: Pupils are equal, round, and reactive to light.  Neck:     Vascular: No carotid bruit.  Cardiovascular:     Rate and Rhythm: Normal rate and regular rhythm.     Pulses: Normal pulses.     Heart sounds: Normal heart sounds. No murmur heard.    No friction rub. No gallop.  Pulmonary:     Effort: Pulmonary effort is normal. No respiratory distress.     Breath sounds: Normal breath sounds. No wheezing, rhonchi or rales.  Chest:     Chest wall: No tenderness.  Abdominal:     General: Bowel sounds are normal. There is no distension.     Palpations: Abdomen is soft. There is no mass.     Tenderness: There is no abdominal tenderness. There is no right CVA tenderness, left CVA tenderness, guarding or rebound.  Musculoskeletal:        General: No swelling or tenderness. Normal range of motion.     Cervical back: Normal range of motion. No rigidity or tenderness.     Right lower leg: Edema present.     Left lower leg: Edema present.     Comments: Bilateral leg trace -1+ edema right leg chronic large than the left leg.No tenderness or erythema.   Feet:     Right foot:     Skin integrity: Callus present. No ulcer, skin breakdown, erythema or warmth.     Toenail Condition: Right toenails are abnormally thick and long.     Left foot:     Skin integrity: No ulcer, skin breakdown, erythema, warmth, callus or dry skin.     Toenail Condition: Left toenails are abnormally thick and long.  Lymphadenopathy:     Cervical: No cervical adenopathy.  Skin:    General: Skin is warm and dry.     Coloration: Skin is not pale.     Findings: No bruising, erythema, lesion or rash.  Neurological:     Mental Status: He is alert and oriented to person, place, and time.     Cranial Nerves: No  cranial nerve deficit.     Sensory: No sensory deficit.     Motor: No weakness.  Coordination: Coordination normal.     Gait: Gait abnormal.  Psychiatric:        Mood and Affect: Mood normal.        Speech: Speech normal.        Behavior: Behavior normal.        Thought Content: Thought content normal.        Judgment: Judgment normal.     Labs reviewed: Recent Labs    09/17/22 1555 09/18/22 0658 09/19/22 0332 09/20/22 0001 10/12/22 1003 10/19/22 0923 10/31/22 1057  NA  --  141 136 134* 139 139 138  K  --  3.3* 3.4* 3.5 3.4* 3.9 3.5  CL  --  106 104 103 107 104 107  CO2  --  22 23 22 22 24 23   GLUCOSE  --  144* 110* 124* 107* 105* 117*  BUN  --  36* 38* 36* 60* 50* 37*  CREATININE  --  3.97* 4.19* 4.47* 4.92* 4.86* 4.31*  CALCIUM  --  8.6* 8.6* 8.5* 8.3* 8.4* 8.3*  MG 2.5* 2.3 2.1  --   --   --   --   PHOS  --   --  3.4 3.4  --   --   --    Recent Labs    08/16/22 0622 09/20/22 0001  AST 30  --   ALT 36  --   ALKPHOS 85  --   BILITOT 0.9  --   PROT 6.5  --   ALBUMIN 3.0* 2.9*   Recent Labs    08/16/22 0622 08/17/22 0347 09/19/22 0332 09/20/22 0001 10/12/22 1003  WBC 3.6*   < > 6.0 6.5 4.0  NEUTROABS 2.0  --   --   --   --   HGB 8.3*   < > 9.0* 8.8* 7.9*  HCT 26.4*   < > 26.2* 25.9* 24.0*  MCV 71.5*   < > 67.0* 66.9* 68.2*  PLT 232   < > 296 280 192   < > = values in this interval not displayed.   Lab Results  Component Value Date   TSH 0.482 02/23/2021   Lab Results  Component Value Date   HGBA1C 5.9 (H) 02/19/2021   Lab Results  Component Value Date   CHOL 137 08/09/2020   HDL 44 08/09/2020   LDLCALC 71 08/09/2020   TRIG 110 08/09/2020   CHOLHDL 3.1 08/09/2020    Significant Diagnostic Results in last 30 days:  MR ABDOMEN WWO CONTRAST  Result Date: 10/29/2022 CLINICAL DATA:  Further evaluation of adrenal lesion. EXAM: MRI ABDOMEN WITHOUT AND WITH CONTRAST TECHNIQUE: Multiplanar multisequence MR imaging of the abdomen was performed  both before and after the administration of intravenous contrast. CONTRAST:  10 cc of [place] COMPARISON:  Multiple priors including CT August 16, 2022 and November 02, 2020. FINDINGS: Lower chest: No acute abnormality. Hepatobiliary: No significant hepatic steatosis. No suspicious hepatic lesion. Nondistended gallbladder. No biliary ductal dilation. Pancreas: No pancreatic ductal dilation or evidence of acute inflammation. Spleen:  No splenomegaly or focal splenic lesion. Adrenals/Urinary Tract: Bilobed T2 hyperintense right adrenal lesion measures 4.5 x 7.6 x 4.4 cm on images 15/3 and 17/2 with homogeneously isointense intrinsic T1 signal without postcontrast enhancement. Left adrenal gland is unremarkable. No hydronephrosis. Bilateral fluid signal renal lesions are compatible with cysts and considered benign requiring no independent imaging follow-up. Stomach/Bowel: Visualized portions within the abdomen are unremarkable. Vascular/Lymphatic: No pathologically enlarged lymph nodes identified. No abdominal aortic aneurysm demonstrated. Other:  None. Musculoskeletal: No suspicious bone lesions identified. IMPRESSION: Bilobed T2 hyperintense right adrenal lesion measures 4.5 x 7.6 x 4.4 cm with homogeneously isointense intrinsic T1 signal without postcontrast enhancement, findings compatible with a hemorrhagic/proteinaceous adrenal cysts. Electronically Signed   By: Maudry Mayhew M.D.   On: 10/29/2022 07:57    Assessment/Plan 1. Former smoker Smoked 2 packs of cigarette per day since he was 47 yrs old.Quit February,2023 Advised to continue to avoid smoking. 2. Coronary artery disease of native artery of native heart with stable angina pectoris Chest pain free -Continue on aspirin and statin -Dietary modification and exercise advised - aspirin EC 81 MG tablet; Take 1 tablet (81 mg total) by mouth daily.  Dispense: 90 tablet; Refill: 1 - atorvastatin (LIPITOR) 40 MG tablet; Take 1 tablet (40 mg total) by  mouth daily.  Dispense: 90 tablet; Refill: 1 - nitroGLYCERIN (NITROSTAT) 0.6 MG SL tablet; Place 1 tablet (0.6 mg total) under the tongue every 5 (five) minutes as needed for chest pain.  Dispense: 100 tablet; Refill: 5  3. Encounter to establish care Available records reviewed, recommend fasting blood work.Immunizations are up-to-date except for COVID-19 booster vaccine.  4. Benign hypertension with CKD (chronic kidney disease) stage IV -Blood pressure not at goal Will refill amlodipine as below - Advised to check Blood pressure at home and record and notify provider if B/p > 140/90  - amLODipine (NORVASC) 10 MG tablet; Take 1 tablet (10 mg total) by mouth daily.  Dispense: 90 tablet; Refill: 1 - Lipid panel - TSH - COMPLETE METABOLIC PANEL WITH GFR - CBC with Differential/Platelet  5. CKD (chronic kidney disease) stage 4, GFR 15-29 ml/min Will avoid Nephrotoxins and dose all other medication for renal clearance  - COMPLETE METABOLIC PANEL WITH GFR  6. Chronic diastolic congestive heart failure No signs of fluid overload  - Keep legs elevated when seated to keep swelling down  - check weight at least three times per week and notify provider for any abrupt weight gain > 3 lbs  - Reduce salt intake in diet  -Continue on torsemide and Coreg - carvedilol (COREG) 25 MG tablet; Take 2 tablets (50 mg total) by mouth 2 (two) times daily.  Dispense: 360 tablet; Refill: 1 - potassium chloride SA (KLOR-CON M) 20 MEQ tablet; Take 2 tablets (40 mEq total) by mouth daily.  Dispense: 60 tablet; Refill: 2 - torsemide (DEMADEX) 20 MG tablet; Take 2 tablets (40 mg total) by mouth daily.  Dispense: 90 tablet; Refill: 1  7. Mixed hyperlipidemia Dietary modification and exercise advised - atorvastatin (LIPITOR) 40 MG tablet; Take 1 tablet (40 mg total) by mouth daily.  Dispense: 90 tablet; Refill: 1  8. Type 2 diabetes mellitus with other circulatory complication, without long-term current use of  insulin No recent labs for review -Dietary control -Recommend checking blood sugars at least once daily -No CBGs - Hemoglobin A1c - Ambulatory referral to Ophthalmology - Microalbumin / creatinine urine ratio - Ambulatory referral to Podiatry  9. Slow transit constipation -- encouraged to increase fiber in diet  - increase water intake to 6-8 glasses of water daily and exercise as tolerated  - docusate sodium (COLACE) 100 MG capsule; Take 1 capsule (100 mg total) by mouth 2 (two) times daily.  Dispense: 90 capsule; Refill: 1  10. Gastroesophageal reflux disease without esophagitis Symptoms controlled -Continue on famotidine - famotidine (PEPCID) 20 MG tablet; Take 1 tablet (20 mg total) by mouth daily.  Dispense: 90 tablet; Refill: 1  11.  Encounter for hepatitis C screening test for low risk patient Low risk - Hep C Antibody; Future  12. Overgrown nail Bilateral toenails overgrown callus noted.  Will refer to podiatrist for further evaluation - Ambulatory referral to Podiatry  13. MS (multiple sclerosis) Continue with supportive care -Will refer to establish with a neurologist - Ambulatory referral to Neurology  14. Moderate persistent asthma without complication Symptoms controlled on current medication -Continue on Trelegy Ellipta and albuterol  Family/ staff Communication: Reviewed plan of care with patient verbalized understanding  Labs/tests ordered:   - Lipid panel - TSH - COMPLETE METABOLIC PANEL WITH GFR - CBC with Differential/Platelet - Hep C Antibody; Future - Hemoglobin A1c  - Microalbumin / creatinine urine ratio  Next Appointment : Return in about 4 months (around 03/23/2023) for medical mangement of chronic issues.Fasting labs tomorrow .   George Bookman, NP

## 2022-11-21 NOTE — Patient Instructions (Addendum)
DUE TO COVID-19 ONLY TWO VISITORS  (aged 48 and older)  ARE ALLOWED TO COME WITH YOU AND STAY IN THE WAITING ROOM ONLY DURING PRE OP AND PROCEDURE.   **NO VISITORS ARE ALLOWED IN THE SHORT STAY AREA OR RECOVERY ROOM!!**  IF YOU WILL BE ADMITTED INTO THE HOSPITAL YOU ARE ALLOWED ONLY FOUR SUPPORT PEOPLE DURING VISITATION HOURS ONLY (7 AM -8PM)   The support person(s) must pass our screening, gel in and out, and wear a mask at all times, including in the patient's room. Patients must also wear a mask when staff or their support person are in the room. Visitors GUEST BADGE MUST BE WORN VISIBLY  One adult visitor may remain with you overnight and MUST be in the room by 8 P.M.     Your procedure is scheduled on: 12/07/22   Report to [facility] Main Entrance    Report to admitting at  10:15 AM   Call this number if you have problems the morning of surgery [phone]   Do not eat food or drink:After Midnight. .             If you have questions, please contact your surgeon's office.   FOLLOW BOWEL PREP AND ANY ADDITIONAL PRE OP INSTRUCTIONS YOU RECEIVED FROM YOUR SURGEON'S OFFICE!!!     Oral Hygiene is also important to reduce your risk of infection.                                    Remember - BRUSH YOUR TEETH THE MORNING OF SURGERY WITH YOUR REGULAR TOOTHPASTE  DENTURES WILL BE REMOVED PRIOR TO SURGERY PLEASE DO NOT APPLY "Poly grip" OR ADHESIVES!!!    Take these medicines the morning of surgery with A SIP OF WATER: Use your inhaler and bring it with you                                                                                                                           Atorvastatin                                                                                                                           Carvedilol  Amlodipine                                                                                                                            Famotidine  DO NOT TAKE ANY ORAL DIABETIC MEDICATIONS DAY OF YOUR SURGERY   Before surgery.Stop taking __ASA 81 mg  7 days prior . Last dose_on _4/11_________as instructed by _____________.  Stop taking ____________as directed by your Surgeon/Cardiologist.  Contact your Surgeon/Cardiologist for instructions on Anticoagulant Therapy prior to surgery.  Bring CPAP mask and tubing day of surgery.                              You may not have any metal on your body including  jewelry, and body piercing             Do not wear lotions, powders, cologne, or deodorant                Men may shave face and neck.   Do not bring valuables to the hospital. [place].   Contacts, glasses, or bridgework may not be worn into surgery.   Bring small overnight bag day of surgery.   DO NOT [place]. PHARMACY WILL DISPENSE MEDICATIONS LISTED ON YOUR MEDICATION LIST TO YOU DURING YOUR ADMISSION [place]!     Special Instructions: Bring a copy of your healthcare power of attorney and living will documents  the day of surgery if you haven't scanned them before.              Please read over the following fact sheets you were given: IF YOU HAVE QUESTIONS ABOUT YOUR PRE-OP INSTRUCTIONS PLEASE CALL [phone]    [facility] - Preparing for Surgery Before surgery, you can play an important role.  Because skin is not sterile, your skin needs to be as free of germs as possible.  You can reduce the number of germs on your skin by washing with CHG (chlorahexidine gluconate) soap before surgery.  CHG is an antiseptic cleaner which kills germs and bonds with the skin to continue killing germs even after washing. Please DO NOT use if you have an allergy to CHG or antibacterial soaps.  If your skin becomes reddened/irritated  stop using the CHG and inform your nurse when you arrive at Short Stay..  You may shave your face/neck. Please follow these instructions carefully:  1.  Shower with CHG Soap the night before surgery and the  morning of Surgery.  2.  If you choose to wash your hair, wash your hair first as usual with your  normal  shampoo.  3.  After you shampoo, rinse your hair and body thoroughly to remove the  shampoo.  4.  Use CHG as you would any other liquid soap.  You can apply chg directly  to the skin and wash                       Gently with a scrungie or clean washcloth.  5.  Apply the CHG Soap to your body ONLY FROM THE NECK DOWN.   Do not use on face/ open                           Wound or open sores. Avoid contact with eyes, ears mouth and genitals (private parts).                       Wash face,  Genitals (private parts) with your normal soap.             6.  Wash thoroughly, paying special attention to the area where your surgery  will be performed.  7.  Thoroughly rinse your body with warm water from the neck down.  8.  DO NOT shower/wash with your normal soap after using and rinsing off  the CHG Soap.             9.  Pat yourself dry with a clean towel.            10.  Wear clean pajamas.            11.  Place clean sheets on your bed the night of your first shower and do not  sleep with pets. Day of Surgery : Do not apply any lotions/deodorants the morning of surgery.  Please wear clean clothes to the hospital/surgery center.  FAILURE TO FOLLOW THESE INSTRUCTIONS MAY RESULT IN THE CANCELLATION OF YOUR SURGERY PATIENT SIGNATURE_________________________________  NURSE SIGNATURE__________________________________  ________________________________________________________________________

## 2022-11-22 ENCOUNTER — Ambulatory Visit: Payer: Medicaid Other | Admitting: Neurology

## 2022-11-22 ENCOUNTER — Other Ambulatory Visit: Payer: Self-pay

## 2022-11-22 ENCOUNTER — Encounter: Payer: Self-pay | Admitting: Neurology

## 2022-11-22 ENCOUNTER — Encounter (HOSPITAL_COMMUNITY)
Admission: RE | Admit: 2022-11-22 | Discharge: 2022-11-22 | Disposition: A | Discharge status: Home / Self Care | Payer: Medicaid Other | Source: Ambulatory Visit

## 2022-11-22 ENCOUNTER — Other Ambulatory Visit: Payer: Medicaid Other

## 2022-11-22 DIAGNOSIS — E1159 Type 2 diabetes mellitus with other circulatory complications: Secondary | ICD-10-CM | POA: Diagnosis not present

## 2022-11-22 DIAGNOSIS — I129 Hypertensive chronic kidney disease with stage 1 through stage 4 chronic kidney disease, or unspecified chronic kidney disease: Secondary | ICD-10-CM | POA: Diagnosis not present

## 2022-11-22 DIAGNOSIS — N184 Chronic kidney disease, stage 4 (severe): Secondary | ICD-10-CM | POA: Diagnosis not present

## 2022-11-22 LAB — MICROALBUMIN / CREATININE URINE RATIO
Creatinine, Urine: 141 mg/dL (ref 20–320)
Microalb Creat Ratio: 550 mg/g creat — ABNORMAL HIGH (ref <30)
Microalb, Ur: 77.5 mg/dL

## 2022-11-22 NOTE — Progress Notes (Signed)
I called Pt and he said that he wasn't sure if he needed the surgery and didn't know that he had a PAT visit. I told him to call the surgeon's office.

## 2022-11-23 ENCOUNTER — Encounter: Payer: Self-pay | Admitting: General Practice

## 2022-11-23 LAB — CBC WITH DIFFERENTIAL/PLATELET
Absolute Monocytes: 413 cells/uL (ref 200–950)
Basophils Absolute: 12 cells/uL (ref 0–200)
Basophils Relative: 0.3 %
Eosinophils Absolute: 133 cells/uL (ref 15–500)
Eosinophils Relative: 3.4 %
HCT: 33.2 % — ABNORMAL LOW (ref 38.5–50.0)
Hemoglobin: 9.9 g/dL — ABNORMAL LOW (ref 13.2–17.1)
Lymphs Abs: 1162.2 cells/uL (ref 850–3900)
MCH: 21.7 pg — ABNORMAL LOW (ref 27.0–33.0)
MCHC: 29.8 g/dL — ABNORMAL LOW (ref 32.0–36.0)
MCV: 72.6 fL — ABNORMAL LOW (ref 80.0–100.0)
MPV: 10.7 fL (ref 7.5–12.5)
Monocytes Relative: 10.6 %
Neutro Abs: 2180 cells/uL (ref 1500–7800)
Neutrophils Relative %: 55.9 %
Platelets: 273 10*3/uL (ref 140–400)
RBC: 4.57 10*6/uL (ref 4.20–5.80)
RDW: 17.7 % — ABNORMAL HIGH (ref 11.0–15.0)
Total Lymphocyte: 29.8 %
WBC: 3.9 10*3/uL (ref 3.8–10.8)

## 2022-11-23 LAB — LIPID PANEL
Cholesterol: 134 mg/dL (ref <200)
HDL: 38 mg/dL — ABNORMAL LOW (ref >=40)
LDL Cholesterol (Calc): 74 mg/dL (calc)
Non-HDL Cholesterol (Calc): 96 mg/dL (calc) (ref <130)
Total CHOL/HDL Ratio: 3.5 (calc) (ref <5.0)
Triglycerides: 138 mg/dL (ref <150)

## 2022-11-23 LAB — COMPLETE METABOLIC PANEL WITH GFR
AG Ratio: 1.1 (calc) (ref 1.0–2.5)
ALT: 27 U/L (ref 9–46)
AST: 22 U/L (ref 10–40)
Albumin: 3.6 g/dL (ref 3.6–5.1)
Alkaline phosphatase (APISO): 87 U/L (ref 36–130)
BUN/Creatinine Ratio: 9 (calc) (ref 6–22)
BUN: 37 mg/dL — ABNORMAL HIGH (ref 7–25)
CO2: 23 mmol/L (ref 20–32)
Calcium: 8.8 mg/dL (ref 8.6–10.3)
Chloride: 111 mmol/L — ABNORMAL HIGH (ref 98–110)
Creat: 4.05 mg/dL — ABNORMAL HIGH (ref 0.60–1.29)
Globulin: 3.2 g/dL (calc) (ref 1.9–3.7)
Glucose, Bld: 93 mg/dL (ref 65–99)
Potassium: 4.1 mmol/L (ref 3.5–5.3)
Sodium: 141 mmol/L (ref 135–146)
Total Bilirubin: 0.6 mg/dL (ref 0.2–1.2)
Total Protein: 6.8 g/dL (ref 6.1–8.1)
eGFR: 17 mL/min/{1.73_m2} — ABNORMAL LOW (ref >=60)

## 2022-11-23 LAB — HEMOGLOBIN A1C
Hgb A1c MFr Bld: 6 % of total Hgb — ABNORMAL HIGH (ref <5.7)
Mean Plasma Glucose: 126 mg/dL
eAG (mmol/L): 7 mmol/L

## 2022-11-23 LAB — TSH: TSH: 1 mIU/L (ref 0.40–4.50)

## 2022-11-25 NOTE — Progress Notes (Deleted)
Psychiatric Initial Adult Assessment  Patient Identification: George Day MRN:  [id number] Date of Evaluation:  11/25/2022 Referral Source: ***  Assessment:  George Day is a 47 y.o. male with a history of *** multiple sclerosis, CAD, T2DM, CKD IV, adrenal mass, anemia, HTN, HLD, OSA on CPAP, asthma, and diastolic HF who presents in person to [facility] for initial evaluation of ***.  Patient reports ***  Plan:  # *** Past medication trials:  Status of problem: *** Interventions: -- ***  # *** Past medication trials:  Status of problem: *** Interventions: -- ***  # *** Past medication trials:  Status of problem: *** Interventions: -- ***  Patient was given contact information for behavioral health clinic and was instructed to call 911 for emergencies.   Subjective:  Chief Complaint: No chief complaint on file.   History of Present Illness:  ***  Chart review: unclear referral Meds in CE: Cymbalta 20 mg BID, Ativan 0.5 mg BID  Upcoming robotic adrenalectomy on 12/07/22 --   Past Psychiatric History:  Diagnoses: ***GAD Medication trials: ***Cymbalta, Ativan Previous psychiatrist/therapist: *** Hospitalizations: *** Suicide attempts: *** SIB: *** Hx of violence towards others: *** Current access to guns: *** Hx of abuse: ***  Substance Abuse History in the last 12 months:  {yes no:314532}  Past Medical History:  Past Medical History:  Diagnosis Date   Anemia 1998   Anginal pain (HCC)    Anxiety    Asthma    Coronary artery disease    Diabetes mellitus without complication (HCC) 1995   Hypertension 2019   MI (myocardial infarction) (HCC) 2010   Sleep apnea    Stroke (HCC) 2010    Past Surgical History:  Procedure Laterality Date   BIOPSY  08/18/2022   Procedure: BIOPSY;  Surgeon: Imogene Burn, MD;  Location: [facility] ENDOSCOPY;  Service: Gastroenterology;;   CARDIAC CATHETERIZATION  2010   COLONOSCOPY WITH PROPOFOL N/A  08/18/2022   Procedure: COLONOSCOPY WITH PROPOFOL;  Surgeon: Imogene Burn, MD;  Location: [facility] ENDOSCOPY;  Service: Gastroenterology;  Laterality: N/A;   ESOPHAGOGASTRODUODENOSCOPY (EGD) WITH PROPOFOL N/A 08/18/2022   Procedure: ESOPHAGOGASTRODUODENOSCOPY (EGD) WITH PROPOFOL;  Surgeon: Imogene Burn, MD;  Location: [facility] ENDOSCOPY;  Service: Gastroenterology;  Laterality: N/A;   POLYPECTOMY  08/18/2022   Procedure: POLYPECTOMY;  Surgeon: Imogene Burn, MD;  Location: [facility] ENDOSCOPY;  Service: Gastroenterology;;    Family Psychiatric History: ***  Family History:  Family History  Problem Relation Age of Onset   Diabetes Mother    Hyperlipidemia Mother    Diabetes Sister    Hyperlipidemia Sister    Hearing loss Sister    Diabetes Brother    Hyperlipidemia Brother     Social History:   Social History   Socioeconomic History   Marital status: Married    Spouse name: Not on file   Number of children: 4   Years of education: Not on file   Highest education level: 10th grade  Occupational History   Occupation: Hydrologist  Tobacco Use   Smoking status: Former    Packs/day: 0.50    Years: 31.00    Additional pack years: 0.00    Total pack years: 15.50    Types: Cigars, E-cigarettes, Cigarettes    Quit date: 09/2021    Years since quitting: 1.1   Smokeless tobacco: Never  Vaping Use   Vaping Use: Never used  Substance and Sexual Activity   Alcohol use: Not Currently  Comment: occasioanlly    Drug use: Not Currently    Types: Marijuana   Sexual activity: Yes    Birth control/protection: None  Other Topics Concern   Not on file  Social History Narrative   Not on file   Social Determinants of Health   Financial Resource Strain: Medium Risk (09/20/2022)   Overall Financial Resource Strain (CARDIA)    Difficulty of Paying Living Expenses: Somewhat hard  Food Insecurity: Food Insecurity Present (10/12/2022)   Hunger Vital Sign    Worried About Running Out of  Food in the Last Year: Often true    Ran Out of Food in the Last Year: Often true  Transportation Needs: No Transportation Needs (09/17/2022)   PRAPARE - Administrator, Civil Service (Medical): No    Lack of Transportation (Non-Medical): No  Physical Activity: Not on file  Stress: Not on file  Social Connections: Not on file    Additional Social History: ***  Allergies:   Allergies  Allergen Reactions   Honey Bee Venom [Bee Venom] Anaphylaxis   Penicillins Anaphylaxis    ALL   Tomato Swelling    Current Medications: Current Outpatient Medications  Medication Sig Dispense Refill   albuterol (VENTOLIN HFA) 108 (90 Base) MCG/ACT inhaler Inhale 1-2 puffs into the lungs every 2 (two) hours as needed for wheezing or shortness of breath. 18 g 0   amLODipine (NORVASC) 10 MG tablet Take 1 tablet (10 mg total) by mouth daily. 90 tablet 1   aspirin EC 81 MG tablet Take 1 tablet (81 mg total) by mouth daily. 90 tablet 1   atorvastatin (LIPITOR) 40 MG tablet Take 1 tablet (40 mg total) by mouth daily. 90 tablet 1   carvedilol (COREG) 25 MG tablet Take 2 tablets (50 mg total) by mouth 2 (two) times daily. 360 tablet 1   docusate sodium (COLACE) 100 MG capsule Take 1 capsule (100 mg total) by mouth 2 (two) times daily. 90 capsule 1   famotidine (PEPCID) 20 MG tablet Take 1 tablet (20 mg total) by mouth daily. 90 tablet 1   Fluticasone-Umeclidin-Vilant (TRELEGY ELLIPTA) 100-62.5-25 MCG/ACT AEPB Inhale 1 puff into the lungs daily. 60 each 5   nitroGLYCERIN (NITROSTAT) 0.6 MG SL tablet Place 1 tablet (0.6 mg total) under the tongue every 5 (five) minutes as needed for chest pain. 100 tablet 5   potassium chloride SA (KLOR-CON M) 20 MEQ tablet Take 2 tablets (40 mEq total) by mouth daily. 60 tablet 2   torsemide (DEMADEX) 20 MG tablet Take 2 tablets (40 mg total) by mouth daily. 90 tablet 1   No current facility-administered medications for this visit.    ROS: Review of  Systems  Objective:  Psychiatric Specialty Exam: There were no vitals taken for this visit.There is no height or weight on file to calculate BMI.  General Appearance: {Appearance:22683}  Eye Contact:  {BHH EYE CONTACT:22684}  Speech:  {Speech:22685}  Volume:  {Volume (PAA):22686}  Mood:  {BHH MOOD:22306}  Affect:  {Affect (PAA):22687}  Thought Content: {Thought Content:22690}   Suicidal Thoughts:  {ST/HT (PAA):22692}  Homicidal Thoughts:  {ST/HT (PAA):22692}  Thought Process:  {Thought Process (PAA):22688}  Orientation:  {BHH ORIENTATION (PAA):22689}    Memory:  {BHH MEMORY:22881}  Judgment:  {Judgement (PAA):22694}  Insight:  {Insight (PAA):22695}  Concentration:  {Concentration:21399}  Recall:  {BHH GOOD/FAIR/POOR:22877}  Fund of Knowledge: {BHH GOOD/FAIR/POOR:22877}  Language: {BHH GOOD/FAIR/POOR:22877}  Psychomotor Activity:  {Psychomotor (PAA):22696}  Akathisia:  {BHH YES OR RC:16384}  AIMS (if indicated): {Desc; done/not:10129}  Assets:  {Assets (PAA):22698}  ADL's:  {BHH VVO'H:60737}  Cognition: {chl bhh cognition:304700322}  Sleep:  {BHH GOOD/FAIR/POOR:22877}   PE: General: well-appearing; no acute distress *** Pulm: no increased work of breathing on room air *** Strength & Muscle Tone: {desc; muscle tone:32375} Neuro: no focal neurological deficits observed *** Gait & Station: {PE GAIT ED TGGY:69485}  Metabolic Disorder Labs: Lab Results  Component Value Date   HGBA1C 6.0 (H) 11/22/2022   MPG 126 11/22/2022   MPG 122.63 02/19/2021   No results found for: "PROLACTIN" Lab Results  Component Value Date   CHOL 134 11/22/2022   TRIG 138 11/22/2022   HDL 38 (L) 11/22/2022   CHOLHDL 3.5 11/22/2022   VLDL 22 08/09/2020   LDLCALC 74 11/22/2022   LDLCALC 71 08/09/2020   Lab Results  Component Value Date   TSH 1.00 11/22/2022    Therapeutic Level Labs: No results found for: "LITHIUM" No results found for: "CBMZ" No results found for:  "VALPROATE"  Screenings:  GAD-7    Flowsheet Row Office Visit from 11/28/2020 in [facility] Office Visit from 02/25/2020 in [facility] Office Visit from 01/05/2020 in [facility]  Total GAD-7 Score 15 12 3       PHQ2-9    Flowsheet Row Office Visit from 11/21/2022 in [facility] Office Visit from 11/28/2020 in [facility] Office Visit from 02/25/2020 in [facility] Office Visit from 01/05/2020 in [facility]  PHQ-2 Total Score 0 1 0 0      Flowsheet Row ED to Hosp-Admission (Discharged) from 09/17/2022 in [facility] GENERAL MED/SURG UNIT ED to Hosp-Admission (Discharged) from 08/16/2022 in [place] ED to Hosp-Admission (Discharged) from 02/19/2021 in [place] [facility]  C-SSRS RISK CATEGORY No Risk No Risk Error: Question 1 not populated       Collaboration of Care: Collaboration of Care: [facility] OP Collaboration of Care:[id number]}  Patient/Guardian was advised Release of Information must be obtained prior to any record release in order to collaborate their care with an outside provider. Patient/Guardian was advised if they have not already done so to contact the registration department to sign all necessary forms in order for [place] to release information regarding their care.   Consent: Patient/Guardian gives verbal consent for treatment and assignment of benefits for services provided during this visit. Patient/Guardian expressed understanding and agreed to proceed.   A total of *** minutes was spent involved in face to face clinical care, chart review, documentation, and ***.   [facility] A Antaeus Karel 4/7/20244:11 PM

## 2022-11-26 ENCOUNTER — Encounter: Payer: Self-pay | Admitting: Family

## 2022-11-26 ENCOUNTER — Other Ambulatory Visit: Payer: Self-pay

## 2022-11-26 NOTE — Progress Notes (Deleted)
Psychiatric Initial Adult Assessment  Patient Identification: George Day MRN:  [id number] Date of Evaluation:  11/26/2022 Referral Source: ***  Assessment:  George Day is a 47 y.o. male with a history of *** multiple sclerosis, CAD with MI in 2010, CVA in 2010, T2DM, CKD IV, adrenal mass, anemia, HTN, HLD, OSA on CPAP, asthma, and diastolic HF who presents in person to [facility] for initial evaluation of ***.  Patient reports ***  Plan:  # *** Past medication trials:  Status of problem: *** Interventions: -- ***  # *** Past medication trials:  Status of problem: *** Interventions: -- ***  # *** Past medication trials:  Status of problem: *** Interventions: -- ***  Patient was given contact information for behavioral health clinic and was instructed to call 911 for emergencies.   Subjective:  Chief Complaint: No chief complaint on file.   History of Present Illness:  ***  Chart review: unclear referral Meds in CE: Cymbalta 20 mg BID, Ativan 0.5 mg BID  Upcoming robotic adrenalectomy on 12/07/22 --   Past Psychiatric History:  Diagnoses: ***GAD Medication trials: ***Cymbalta, Ativan Previous psychiatrist/therapist: *** Hospitalizations: *** Suicide attempts: *** SIB: *** Hx of violence towards others: *** Current access to guns: *** Hx of abuse: ***  Substance Abuse History in the last 12 months:  {yes no:314532}  Past Medical History:  Past Medical History:  Diagnosis Date   Anemia 1998   Anginal pain (HCC)    Anxiety    Asthma    Coronary artery disease    Diabetes mellitus without complication (HCC) 1995   Hypertension 2019   MI (myocardial infarction) (HCC) 2010   Sleep apnea    Stroke (HCC) 2010    Past Surgical History:  Procedure Laterality Date   BIOPSY  08/18/2022   Procedure: BIOPSY;  Surgeon: Imogene Burn, MD;  Location[facility] ENDOSCOPY;  Service: Gastroenterology;;   CARDIAC CATHETERIZATION  2010    COLONOSCOPY WITH PROPOFOL N/A 08/18/2022   Procedure: COLONOSCOPY WITH PROPOFOL;  Surgeon: Imogene Burn, MD;  Location: [facility] ENDOSCOPY;  Service: Gastroenterology;  Laterality: N/A;   ESOPHAGOGASTRODUODENOSCOPY (EGD) WITH PROPOFOL N/A 08/18/2022   Procedure: ESOPHAGOGASTRODUODENOSCOPY (EGD) WITH PROPOFOL;  Surgeon: Imogene Burn, MD;  Location: [facility] ENDOSCOPY;  Service: Gastroenterology;  Laterality: N/A;   POLYPECTOMY  08/18/2022   Procedure: POLYPECTOMY;  Surgeon: Imogene Burn, MD;  Location: [facility] ENDOSCOPY;  Service: Gastroenterology;;    Family Psychiatric History: ***  Family History:  Family History  Problem Relation Age of Onset   Diabetes Mother    Hyperlipidemia Mother    Diabetes Sister    Hyperlipidemia Sister    Hearing loss Sister    Diabetes Brother    Hyperlipidemia Brother     Social History:   Social History   Socioeconomic History   Marital status: Married    Spouse name: Not on file   Number of children: 4   Years of education: Not on file   Highest education level: 10th grade  Occupational History   Occupation: Hydrologist  Tobacco Use   Smoking status: Former    Packs/day: 0.50    Years: 31.00    Additional pack years: 0.00    Total pack years: 15.50    Types: Cigars, E-cigarettes, Cigarettes    Quit date: 09/2021    Years since quitting: 1.1   Smokeless tobacco: Never  Vaping Use   Vaping Use: Never used  Substance and Sexual Activity  Alcohol use: Not Currently    Comment: occasioanlly    Drug use: Not Currently    Types: Marijuana   Sexual activity: Yes    Birth control/protection: None  Other Topics Concern   Not on file  Social History Narrative   Not on file   Social Determinants of Health   Financial Resource Strain: Medium Risk (09/20/2022)   Overall Financial Resource Strain (CARDIA)    Difficulty of Paying Living Expenses: Somewhat hard  Food Insecurity: Food Insecurity Present (10/12/2022)   Hunger Vital Sign     Worried About Running Out of Food in the Last Year: Often true    Ran Out of Food in the Last Year: Often true  Transportation Needs: No Transportation Needs (09/17/2022)   PRAPARE - Administrator, Civil Service (Medical): No    Lack of Transportation (Non-Medical): No  Physical Activity: Not on file  Stress: Not on file  Social Connections: Not on file    Additional Social History: ***  Allergies:   Allergies  Allergen Reactions   Honey Bee Venom [Bee Venom] Anaphylaxis   Penicillins Anaphylaxis    ALL   Tomato Swelling    Current Medications: Current Outpatient Medications  Medication Sig Dispense Refill   albuterol (VENTOLIN HFA) 108 (90 Base) MCG/ACT inhaler Inhale 1-2 puffs into the lungs every 2 (two) hours as needed for wheezing or shortness of breath. 18 g 0   amLODipine (NORVASC) 10 MG tablet Take 1 tablet (10 mg total) by mouth daily. 90 tablet 1   aspirin EC 81 MG tablet Take 1 tablet (81 mg total) by mouth daily. 90 tablet 1   atorvastatin (LIPITOR) 40 MG tablet Take 1 tablet (40 mg total) by mouth daily. 90 tablet 1   carvedilol (COREG) 25 MG tablet Take 2 tablets (50 mg total) by mouth 2 (two) times daily. 360 tablet 1   docusate sodium (COLACE) 100 MG capsule Take 1 capsule (100 mg total) by mouth 2 (two) times daily. 90 capsule 1   famotidine (PEPCID) 20 MG tablet Take 1 tablet (20 mg total) by mouth daily. 90 tablet 1   Fluticasone-Umeclidin-Vilant (TRELEGY ELLIPTA) 100-62.5-25 MCG/ACT AEPB Inhale 1 puff into the lungs daily. 60 each 5   nitroGLYCERIN (NITROSTAT) 0.6 MG SL tablet Place 1 tablet (0.6 mg total) under the tongue every 5 (five) minutes as needed for chest pain. 100 tablet 5   potassium chloride SA (KLOR-CON M) 20 MEQ tablet Take 2 tablets (40 mEq total) by mouth daily. 60 tablet 2   torsemide (DEMADEX) 20 MG tablet Take 2 tablets (40 mg total) by mouth daily. 90 tablet 1   No current facility-administered medications for this visit.     ROS: Review of Systems  Objective:  Psychiatric Specialty Exam: There were no vitals taken for this visit.There is no height or weight on file to calculate BMI.  General Appearance: {Appearance:22683}  Eye Contact:  {BHH EYE CONTACT:22684}  Speech:  {Speech:22685}  Volume:  {Volume (PAA):22686}  Mood:  {BHH MOOD:22306}  Affect:  {Affect (PAA):22687}  Thought Content: {Thought Content:22690}   Suicidal Thoughts:  {ST/HT (PAA):22692}  Homicidal Thoughts:  {ST/HT (PAA):22692}  Thought Process:  {Thought Process (PAA):22688}  Orientation:  {BHH ORIENTATION (PAA):22689}    Memory:  {BHH MEMORY:22881}  Judgment:  {Judgement (PAA):22694}  Insight:  {Insight (PAA):22695}  Concentration:  {Concentration:21399}  Recall:  {BHH GOOD/FAIR/POOR:22877}  Fund of Knowledge: {BHH GOOD/FAIR/POOR:22877}  Language: {BHH GOOD/FAIR/POOR:22877}  Psychomotor Activity:  {Psychomotor (PAA):22696}  Akathisia:  {BHH YES OR NO:22294}  AIMS (if indicated): {Desc; done/not:10129}  Assets:  {Assets (PAA):22698}  ADL's:  {BHH LMR'A:15183}  Cognition: {chl bhh cognition:304700322}  Sleep:  {BHH GOOD/FAIR/POOR:22877}   PE: General: well-appearing; no acute distress *** Pulm: no increased work of breathing on room air *** Strength & Muscle Tone: {desc; muscle tone:32375} Neuro: no focal neurological deficits observed *** Gait & Station: {PE GAIT ED UPBD:57897}  Metabolic Disorder Labs: Lab Results  Component Value Date   HGBA1C 6.0 (H) 11/22/2022   MPG 126 11/22/2022   MPG 122.63 02/19/2021   No results found for: "PROLACTIN" Lab Results  Component Value Date   CHOL 134 11/22/2022   TRIG 138 11/22/2022   HDL 38 (L) 11/22/2022   CHOLHDL 3.5 11/22/2022   VLDL 22 08/09/2020   LDLCALC 74 11/22/2022   LDLCALC 71 08/09/2020   Lab Results  Component Value Date   TSH 1.00 11/22/2022    Therapeutic Level Labs: No results found for: "LITHIUM" No results found for: "CBMZ" No results found  for: "VALPROATE"  Screenings:  GAD-7    Flowsheet Row Office Visit from 11/28/2020 in [facility] Office Visit from 02/25/2020 in [facility] Office Visit from 01/05/2020 in [facility]  Total GAD-7 Score 15 12 3       PHQ2-9    Flowsheet Row Office Visit from 11/21/2022 in [facility] Office Visit from 11/28/2020 in [facility] Office Visit from 02/25/2020 in [facility] Office Visit from 01/05/2020 in [facility]  PHQ-2 Total Score 0 1 0 0      Flowsheet Row ED to Hosp-Admission (Discharged) from 09/17/2022 in [facility] GENERAL MED/SURG UNIT ED to Hosp-Admission (Discharged) from 08/16/2022 in [place] [facility] ED to Hosp-Admission (Discharged) from 02/19/2021 in [place] [facility]  C-SSRS RISK CATEGORY No Risk No Risk Error: Question 1 not populated       Collaboration of Care: Collaboration of Care: [facility] OP Collaboration of Care:[id number]}  Patient/Guardian was advised Release of Information must be obtained prior to any record release in order to collaborate their care with an outside provider. Patient/Guardian was advised if they have not already done so to contact the registration department to sign all necessary forms in order for [place] to release information regarding their care.   Consent: Patient/Guardian gives verbal consent for treatment and assignment of benefits for services provided during this visit. Patient/Guardian expressed understanding and agreed to proceed.   A total of *** minutes was spent involved in face to face clinical care, chart review, documentation, and ***.   Leianna Barga A Tarique Loveall 4/8/20247:53 PM

## 2022-11-27 ENCOUNTER — Ambulatory Visit (HOSPITAL_COMMUNITY): Payer: Medicaid Other | Admitting: Psychiatry

## 2022-11-28 ENCOUNTER — Other Ambulatory Visit: Payer: Self-pay

## 2022-11-29 ENCOUNTER — Ambulatory Visit (INDEPENDENT_AMBULATORY_CARE_PROVIDER_SITE_OTHER): Payer: Medicaid Other | Admitting: Podiatry

## 2022-11-29 DIAGNOSIS — E1140 Type 2 diabetes mellitus with diabetic neuropathy, unspecified: Secondary | ICD-10-CM

## 2022-11-29 DIAGNOSIS — E1149 Type 2 diabetes mellitus with other diabetic neurological complication: Secondary | ICD-10-CM | POA: Diagnosis not present

## 2022-11-29 DIAGNOSIS — L600 Ingrowing nail: Secondary | ICD-10-CM

## 2022-11-29 DIAGNOSIS — D3500 Benign neoplasm of unspecified adrenal gland: Secondary | ICD-10-CM | POA: Diagnosis not present

## 2022-11-29 DIAGNOSIS — B351 Tinea unguium: Secondary | ICD-10-CM

## 2022-11-29 NOTE — Progress Notes (Signed)
Subjective:   Patient ID: George Day, male   DOB: 47 y.o.   MRN: [id number]   HPI Patient presents with caregiver with severely elongated nailbeds 1-5 both feet generalized discomfort in the feet and long-term diabetes.  Patient cannot take care of things himself and has questions.   ROS      Objective:  Physical Exam  Neurovascular status was found to be intact muscle strength found to be adequate range of motion subtalar midtarsal joint adequate.  He does have severely thickened elongated nailbeds 1-5 both feet with irritation of his tissue and does have indications of multiple ingrown toenails     Assessment:  Chronic nail disease mycosis component with also ingrown component nail damage      Plan:  H&P reviewed all conditions recommended conservative care with consideration for nail removal which was educated to him today.  Debridement accomplished no angiogenic bleeding and patient will be seen back on an as-needed basis for treatment

## 2022-11-30 ENCOUNTER — Other Ambulatory Visit: Payer: Self-pay

## 2022-12-03 ENCOUNTER — Other Ambulatory Visit: Payer: Self-pay

## 2022-12-03 ENCOUNTER — Other Ambulatory Visit (HOSPITAL_COMMUNITY): Payer: Self-pay | Admitting: Internal Medicine

## 2022-12-04 ENCOUNTER — Ambulatory Visit (HOSPITAL_COMMUNITY): Payer: Medicaid Other | Admitting: Mental Health

## 2022-12-06 ENCOUNTER — Ambulatory Visit (HOSPITAL_COMMUNITY): Payer: Self-pay | Admitting: Psychiatry

## 2022-12-07 ENCOUNTER — Ambulatory Visit: Admit: 2022-12-07 | Payer: Medicaid Other | Admitting: Urology

## 2022-12-07 DIAGNOSIS — I10 Essential (primary) hypertension: Secondary | ICD-10-CM

## 2022-12-07 DIAGNOSIS — E1159 Type 2 diabetes mellitus with other circulatory complications: Secondary | ICD-10-CM

## 2022-12-07 SURGERY — ADRENALECTOMY, ROBOT-ASSISTED
Anesthesia: General | Laterality: Right

## 2022-12-11 ENCOUNTER — Other Ambulatory Visit: Payer: Self-pay

## 2022-12-14 ENCOUNTER — Telehealth: Payer: Self-pay | Admitting: *Deleted

## 2022-12-17 ENCOUNTER — Other Ambulatory Visit: Payer: Self-pay

## 2022-12-17 DIAGNOSIS — E113293 Type 2 diabetes mellitus with mild nonproliferative diabetic retinopathy without macular edema, bilateral: Secondary | ICD-10-CM | POA: Diagnosis not present

## 2022-12-17 LAB — HM DIABETES EYE EXAM

## 2022-12-25 ENCOUNTER — Other Ambulatory Visit: Payer: Self-pay

## 2023-01-02 ENCOUNTER — Telehealth: Payer: Self-pay | Admitting: *Deleted

## 2023-01-02 NOTE — Telephone Encounter (Signed)
Drucie Opitz #

## 2023-01-03 ENCOUNTER — Telehealth (HOSPITAL_COMMUNITY): Payer: Self-pay | Admitting: *Deleted

## 2023-01-03 NOTE — Telephone Encounter (Signed)
Reaching out to patient to offer assistance regarding upcoming cardiac imaging study; pt verbalizes understanding of appt date/time, parking situation and where to check in; name and call back number provided for further questions should they arise  Larey Brick RN Navigator Cardiac Imaging [facility] [phone] office [phone] cell  Patient states he's had an MRI with contrast without incident.

## 2023-01-04 ENCOUNTER — Other Ambulatory Visit (HOSPITAL_COMMUNITY): Payer: Self-pay | Admitting: Family Medicine

## 2023-01-04 ENCOUNTER — Ambulatory Visit (HOSPITAL_COMMUNITY)
Admission: RE | Admit: 2023-01-04 | Discharge: 2023-01-04 | Disposition: A | Discharge status: Home / Self Care | Payer: 59 | Source: Ambulatory Visit | Attending: Family Medicine | Admitting: Family Medicine

## 2023-01-04 DIAGNOSIS — I5032 Chronic diastolic (congestive) heart failure: Secondary | ICD-10-CM

## 2023-01-04 MED ORDER — GADOBUTROL 1 MMOL/ML IV SOLN
9.0000 mL | Freq: Once | INTRAVENOUS | Status: AC | PRN
  Administered 2023-01-04: 9 mL via INTRAVENOUS

## 2023-01-09 ENCOUNTER — Ambulatory Visit (HOSPITAL_COMMUNITY): Payer: Medicaid Other | Admitting: Mental Health

## 2023-01-10 ENCOUNTER — Other Ambulatory Visit: Payer: Self-pay

## 2023-01-11 DIAGNOSIS — M1990 Unspecified osteoarthritis, unspecified site: Secondary | ICD-10-CM | POA: Diagnosis not present

## 2023-01-11 DIAGNOSIS — I509 Heart failure, unspecified: Secondary | ICD-10-CM | POA: Diagnosis not present

## 2023-01-11 DIAGNOSIS — Z880 Allergy status to penicillin: Secondary | ICD-10-CM | POA: Diagnosis not present

## 2023-01-11 DIAGNOSIS — Z809 Family history of malignant neoplasm, unspecified: Secondary | ICD-10-CM | POA: Diagnosis not present

## 2023-01-11 DIAGNOSIS — E669 Obesity, unspecified: Secondary | ICD-10-CM | POA: Diagnosis not present

## 2023-01-11 DIAGNOSIS — K219 Gastro-esophageal reflux disease without esophagitis: Secondary | ICD-10-CM | POA: Diagnosis not present

## 2023-01-11 DIAGNOSIS — I130 Hypertensive heart and chronic kidney disease with heart failure and stage 1 through stage 4 chronic kidney disease, or unspecified chronic kidney disease: Secondary | ICD-10-CM | POA: Diagnosis not present

## 2023-01-11 DIAGNOSIS — Z8249 Family history of ischemic heart disease and other diseases of the circulatory system: Secondary | ICD-10-CM | POA: Diagnosis not present

## 2023-01-11 DIAGNOSIS — Z87891 Personal history of nicotine dependence: Secondary | ICD-10-CM | POA: Diagnosis not present

## 2023-01-11 DIAGNOSIS — N184 Chronic kidney disease, stage 4 (severe): Secondary | ICD-10-CM | POA: Diagnosis not present

## 2023-01-11 DIAGNOSIS — Z833 Family history of diabetes mellitus: Secondary | ICD-10-CM | POA: Diagnosis not present

## 2023-01-11 DIAGNOSIS — E785 Hyperlipidemia, unspecified: Secondary | ICD-10-CM | POA: Diagnosis not present

## 2023-02-05 ENCOUNTER — Encounter: Payer: Self-pay | Admitting: Family

## 2023-02-12 ENCOUNTER — Inpatient Hospital Stay (HOSPITAL_COMMUNITY)
Admission: EM | Admit: 2023-02-12 | Discharge: 2023-02-15 | DRG: 291 | Disposition: A | Discharge status: Home / Self Care | Payer: 59 | Attending: Internal Medicine | Admitting: Internal Medicine

## 2023-02-12 ENCOUNTER — Emergency Department (HOSPITAL_COMMUNITY): Payer: 59

## 2023-02-12 DIAGNOSIS — Z7982 Long term (current) use of aspirin: Secondary | ICD-10-CM

## 2023-02-12 DIAGNOSIS — Z91018 Allergy to other foods: Secondary | ICD-10-CM

## 2023-02-12 DIAGNOSIS — I110 Hypertensive heart disease with heart failure: Secondary | ICD-10-CM | POA: Diagnosis not present

## 2023-02-12 DIAGNOSIS — I132 Hypertensive heart and chronic kidney disease with heart failure and with stage 5 chronic kidney disease, or end stage renal disease: Principal | ICD-10-CM | POA: Diagnosis present

## 2023-02-12 DIAGNOSIS — G35 Multiple sclerosis: Secondary | ICD-10-CM

## 2023-02-12 DIAGNOSIS — Z743 Need for continuous supervision: Secondary | ICD-10-CM | POA: Diagnosis not present

## 2023-02-12 DIAGNOSIS — E876 Hypokalemia: Secondary | ICD-10-CM | POA: Diagnosis present

## 2023-02-12 DIAGNOSIS — I5033 Acute on chronic diastolic (congestive) heart failure: Secondary | ICD-10-CM | POA: Diagnosis present

## 2023-02-12 DIAGNOSIS — J90 Pleural effusion, not elsewhere classified: Secondary | ICD-10-CM | POA: Diagnosis not present

## 2023-02-12 DIAGNOSIS — I509 Heart failure, unspecified: Secondary | ICD-10-CM | POA: Diagnosis not present

## 2023-02-12 DIAGNOSIS — I5032 Chronic diastolic (congestive) heart failure: Secondary | ICD-10-CM | POA: Diagnosis present

## 2023-02-12 DIAGNOSIS — Z79899 Other long term (current) drug therapy: Secondary | ICD-10-CM

## 2023-02-12 DIAGNOSIS — R7989 Other specified abnormal findings of blood chemistry: Secondary | ICD-10-CM | POA: Diagnosis present

## 2023-02-12 DIAGNOSIS — J449 Chronic obstructive pulmonary disease, unspecified: Secondary | ICD-10-CM | POA: Diagnosis present

## 2023-02-12 DIAGNOSIS — Z880 Allergy status to penicillin: Secondary | ICD-10-CM

## 2023-02-12 DIAGNOSIS — I252 Old myocardial infarction: Secondary | ICD-10-CM

## 2023-02-12 DIAGNOSIS — Z5986 Financial insecurity: Secondary | ICD-10-CM

## 2023-02-12 DIAGNOSIS — R079 Chest pain, unspecified: Secondary | ICD-10-CM | POA: Diagnosis not present

## 2023-02-12 DIAGNOSIS — N189 Chronic kidney disease, unspecified: Secondary | ICD-10-CM | POA: Diagnosis present

## 2023-02-12 DIAGNOSIS — D509 Iron deficiency anemia, unspecified: Secondary | ICD-10-CM | POA: Diagnosis present

## 2023-02-12 DIAGNOSIS — I517 Cardiomegaly: Secondary | ICD-10-CM | POA: Diagnosis not present

## 2023-02-12 DIAGNOSIS — I3139 Other pericardial effusion (noninflammatory): Secondary | ICD-10-CM | POA: Diagnosis present

## 2023-02-12 DIAGNOSIS — R0789 Other chest pain: Secondary | ICD-10-CM | POA: Diagnosis not present

## 2023-02-12 DIAGNOSIS — Z23 Encounter for immunization: Secondary | ICD-10-CM

## 2023-02-12 DIAGNOSIS — I160 Hypertensive urgency: Secondary | ICD-10-CM | POA: Diagnosis present

## 2023-02-12 DIAGNOSIS — J811 Chronic pulmonary edema: Secondary | ICD-10-CM | POA: Diagnosis not present

## 2023-02-12 DIAGNOSIS — E663 Overweight: Secondary | ICD-10-CM | POA: Diagnosis present

## 2023-02-12 DIAGNOSIS — Z86711 Personal history of pulmonary embolism: Secondary | ICD-10-CM

## 2023-02-12 DIAGNOSIS — Z83438 Family history of other disorder of lipoprotein metabolism and other lipidemia: Secondary | ICD-10-CM

## 2023-02-12 DIAGNOSIS — Z8673 Personal history of transient ischemic attack (TIA), and cerebral infarction without residual deficits: Secondary | ICD-10-CM

## 2023-02-12 DIAGNOSIS — I213 ST elevation (STEMI) myocardial infarction of unspecified site: Secondary | ICD-10-CM | POA: Diagnosis not present

## 2023-02-12 DIAGNOSIS — G4489 Other headache syndrome: Secondary | ICD-10-CM | POA: Diagnosis not present

## 2023-02-12 DIAGNOSIS — I499 Cardiac arrhythmia, unspecified: Secondary | ICD-10-CM | POA: Diagnosis not present

## 2023-02-12 DIAGNOSIS — N184 Chronic kidney disease, stage 4 (severe): Secondary | ICD-10-CM

## 2023-02-12 DIAGNOSIS — Z87892 Personal history of anaphylaxis: Secondary | ICD-10-CM

## 2023-02-12 DIAGNOSIS — J4489 Other specified chronic obstructive pulmonary disease: Secondary | ICD-10-CM | POA: Diagnosis present

## 2023-02-12 DIAGNOSIS — I2510 Atherosclerotic heart disease of native coronary artery without angina pectoris: Secondary | ICD-10-CM | POA: Diagnosis present

## 2023-02-12 DIAGNOSIS — Z91030 Bee allergy status: Secondary | ICD-10-CM

## 2023-02-12 DIAGNOSIS — Z833 Family history of diabetes mellitus: Secondary | ICD-10-CM

## 2023-02-12 DIAGNOSIS — G4730 Sleep apnea, unspecified: Secondary | ICD-10-CM | POA: Diagnosis present

## 2023-02-12 DIAGNOSIS — N179 Acute kidney failure, unspecified: Secondary | ICD-10-CM | POA: Diagnosis present

## 2023-02-12 DIAGNOSIS — Z87891 Personal history of nicotine dependence: Secondary | ICD-10-CM

## 2023-02-12 DIAGNOSIS — E785 Hyperlipidemia, unspecified: Secondary | ICD-10-CM | POA: Diagnosis present

## 2023-02-12 DIAGNOSIS — I10 Essential (primary) hypertension: Secondary | ICD-10-CM

## 2023-02-12 DIAGNOSIS — E1122 Type 2 diabetes mellitus with diabetic chronic kidney disease: Secondary | ICD-10-CM | POA: Diagnosis present

## 2023-02-12 DIAGNOSIS — R0603 Acute respiratory distress: Secondary | ICD-10-CM | POA: Diagnosis present

## 2023-02-12 DIAGNOSIS — J9601 Acute respiratory failure with hypoxia: Secondary | ICD-10-CM | POA: Diagnosis present

## 2023-02-12 DIAGNOSIS — N185 Chronic kidney disease, stage 5: Secondary | ICD-10-CM | POA: Diagnosis present

## 2023-02-12 LAB — I-STAT CHEM 8, ED
BUN: 48 mg/dL — ABNORMAL HIGH (ref 6–20)
Calcium, Ion: 1.13 mmol/L — ABNORMAL LOW (ref 1.15–1.40)
Chloride: 107 mmol/L (ref 98–111)
Creatinine, Ser: 5.3 mg/dL — ABNORMAL HIGH (ref 0.61–1.24)
Glucose, Bld: 116 mg/dL — ABNORMAL HIGH (ref 70–99)
HCT: 33 % — ABNORMAL LOW (ref 39.0–52.0)
Hemoglobin: 11.2 g/dL — ABNORMAL LOW (ref 13.0–17.0)
Potassium: 3.2 mmol/L — ABNORMAL LOW (ref 3.5–5.1)
Sodium: 140 mmol/L (ref 135–145)
TCO2: 21 mmol/L — ABNORMAL LOW (ref 22–32)

## 2023-02-12 LAB — CBG MONITORING, ED: Glucose-Capillary: 119 mg/dL — ABNORMAL HIGH (ref 70–99)

## 2023-02-12 MED ORDER — NITROGLYCERIN 2 % TD OINT
1.0000 [in_us] | TOPICAL_OINTMENT | Freq: Once | TRANSDERMAL | Status: AC
  Administered 2023-02-12: 1 [in_us] via TOPICAL
  Filled 2023-02-12: qty 1

## 2023-02-12 MED ORDER — SODIUM CHLORIDE 0.9 % IV SOLN: INTRAVENOUS | Status: DC

## 2023-02-12 NOTE — ED Provider Notes (Signed)
[place] EMERGENCY DEPARTMENT AT [facility] Provider Note  CSN: [id number] Arrival date & time: 02/12/23 2255  Chief Complaint(s) Chest Pain  HPI Kionte Baumgardner is a 47 y.o. male {Add pertinent medical, surgical, social history, OB history to HPI:1}    Chest Pain   Past Medical History Past Medical History:  Diagnosis Date   Anemia 1998   Anginal pain (HCC)    Anxiety    Asthma    Coronary artery disease    Diabetes mellitus without complication (HCC) 1995   Hypertension 2019   MI (myocardial infarction) (HCC) 2010   Sleep apnea    Stroke [facility]) 2010   Patient Active Problem List   Diagnosis Date Noted   Chronic diastolic heart failure (HCC) 10/12/2022   MS (multiple sclerosis) (HCC) 10/12/2022   Hypervolemia 09/17/2022   Anasarca 09/17/2022   Hematochezia 08/18/2022   Pericardial effusion 08/17/2022   Coronary artery disease 08/17/2022   Homelessness 08/16/2022   Hypertensive urgency 02/19/2021   AKI (acute kidney injury) (HCC)    Hypokalemia    Accelerated hypertension 02/16/2021   Right adrenal mass (HCC) 11/28/2020   Anemia 08/31/2020   Former smoker 08/29/2020   Hyperlipidemia associated with type 2 diabetes mellitus (HCC) 08/29/2020   Microcytic anemia 08/29/2020   Influenza vaccination declined 08/29/2020   23-polyvalent pneumococcal polysaccharide vaccine declined 08/29/2020   Chest pain 08/08/2020   GAD (generalized anxiety disorder) 02/25/2020   Hyperlipidemia 02/25/2020   CKD (chronic kidney disease) stage 4, GFR 15-29 ml/min (HCC) 02/25/2020   Diabetes mellitus (HCC) 01/05/2020   Essential hypertension 01/05/2020   Tobacco dependence 01/05/2020   Coronary artery disease involving native coronary artery of native heart without angina pectoris 01/05/2020   History of CVA with residual deficit 01/05/2020   Moderate persistent asthma without complication 01/05/2020   Class 1 obesity due to excess calories with serious comorbidity and  body mass index (BMI) of 32.0 to 32.9 in adult 01/05/2020   Home Medication(s) Prior to Admission medications   Medication Sig Start Date End Date Taking? Authorizing Provider  albuterol (VENTOLIN HFA) 108 (90 Base) MCG/ACT inhaler Inhale 1-2 puffs into the lungs every 2 (two) hours as needed for wheezing or shortness of breath. 09/20/22   Levin Erp, MD  amLODipine (NORVASC) 10 MG tablet Take 1 tablet (10 mg total) by mouth daily. 11/21/22   Ngetich, Dinah C, NP  aspirin EC 81 MG tablet Take 1 tablet (81 mg total) by mouth daily. 11/21/22   Ngetich, Dinah C, NP  atorvastatin (LIPITOR) 40 MG tablet Take 1 tablet (40 mg total) by mouth daily. 11/21/22   Ngetich, Dinah C, NP  carvedilol (COREG) 25 MG tablet Take 2 tablets (50 mg total) by mouth 2 (two) times daily. 11/21/22   Ngetich, Dinah C, NP  docusate sodium (COLACE) 100 MG capsule Take 1 capsule (100 mg total) by mouth 2 (two) times daily. 11/21/22   Ngetich, Dinah C, NP  famotidine (PEPCID) 20 MG tablet Take 1 tablet (20 mg total) by mouth daily. 11/21/22   Ngetich, Dinah C, NP  Fluticasone-Umeclidin-Vilant (TRELEGY ELLIPTA) 100-62.5-25 MCG/ACT AEPB Inhale 1 puff into the lungs daily. 11/21/22   Ngetich, Dinah C, NP  nitroGLYCERIN (NITROSTAT) 0.6 MG SL tablet Place 1 tablet (0.6 mg total) under the tongue every 5 (five) minutes as needed for chest pain. 11/21/22   Ngetich, Dinah C, NP  potassium chloride SA (KLOR-CON M) 20 MEQ tablet Take 2 tablets (40 mEq total) by mouth daily. 11/21/22  Ngetich, Dinah C, NP  torsemide (DEMADEX) 20 MG tablet Take 2 tablets (40 mg total) by mouth daily. 11/21/22   Ngetich, Dinah C, NP                                                                                                                                    Allergies Honey bee venom [bee venom], Penicillins, and Tomato  Review of Systems Review of Systems  Cardiovascular:  Positive for chest pain.   As noted in HPI  Physical Exam Vital Signs  I have reviewed  the triage vital signs BP (!) 180/106 (BP Location: Right Arm)   Pulse 88   Temp 98 F (36.7 C) (Oral)   SpO2 93%  *** Physical Exam  ED Results and Treatments Labs (all labs ordered are listed, but only abnormal results are displayed) Labs Reviewed - No data to display                                                                                                                       EKG  EKG Interpretation  Date/Time:    Ventricular Rate:    PR Interval:    QRS Duration:   QT Interval:    QTC Calculation:   R Axis:     Text Interpretation:         Radiology No results found.  Medications Ordered in ED Medications - No data to display Procedures Procedures  (including critical care time) Medical Decision Making / ED Course   Medical Decision Making   ***    Final Clinical Impression(s) / ED Diagnoses Final diagnoses:  None    This chart was dictated using voice recognition software.  Despite best efforts to proofread,  errors can occur which can change the documentation meaning.

## 2023-02-12 NOTE — ED Triage Notes (Signed)
Pt to ED via EMS from home. Pt c/o CP pressure that started at 6pm. Pt also c/o headache earlier that has resolved. Pt endorses nausea, SOB, and blurred vision. Pt took 1 nitroglycerin at home at 6pm and EMS gave 2 more nitroglycerin en route with no relief. EMS also gave 6 of morphine en route. EMS also gave 4 of zofran en route. EMS placed pt on 2L [place] for comfort.    EMS Vitals: 20 L wrist 208/122 164/96 80 HR 130 CBG 96%

## 2023-02-13 ENCOUNTER — Other Ambulatory Visit: Payer: Self-pay

## 2023-02-13 DIAGNOSIS — I5033 Acute on chronic diastolic (congestive) heart failure: Secondary | ICD-10-CM | POA: Diagnosis not present

## 2023-02-13 DIAGNOSIS — Z83438 Family history of other disorder of lipoprotein metabolism and other lipidemia: Secondary | ICD-10-CM | POA: Diagnosis not present

## 2023-02-13 DIAGNOSIS — I160 Hypertensive urgency: Secondary | ICD-10-CM

## 2023-02-13 DIAGNOSIS — G4730 Sleep apnea, unspecified: Secondary | ICD-10-CM | POA: Diagnosis present

## 2023-02-13 DIAGNOSIS — I132 Hypertensive heart and chronic kidney disease with heart failure and with stage 5 chronic kidney disease, or end stage renal disease: Secondary | ICD-10-CM | POA: Diagnosis not present

## 2023-02-13 DIAGNOSIS — Z23 Encounter for immunization: Secondary | ICD-10-CM | POA: Diagnosis not present

## 2023-02-13 DIAGNOSIS — J449 Chronic obstructive pulmonary disease, unspecified: Secondary | ICD-10-CM | POA: Diagnosis present

## 2023-02-13 DIAGNOSIS — Z833 Family history of diabetes mellitus: Secondary | ICD-10-CM | POA: Diagnosis not present

## 2023-02-13 DIAGNOSIS — R0603 Acute respiratory distress: Secondary | ICD-10-CM | POA: Diagnosis not present

## 2023-02-13 DIAGNOSIS — N179 Acute kidney failure, unspecified: Secondary | ICD-10-CM | POA: Diagnosis not present

## 2023-02-13 DIAGNOSIS — J9601 Acute respiratory failure with hypoxia: Secondary | ICD-10-CM | POA: Diagnosis not present

## 2023-02-13 DIAGNOSIS — R7989 Other specified abnormal findings of blood chemistry: Secondary | ICD-10-CM | POA: Diagnosis not present

## 2023-02-13 DIAGNOSIS — D509 Iron deficiency anemia, unspecified: Secondary | ICD-10-CM | POA: Diagnosis not present

## 2023-02-13 DIAGNOSIS — Z86711 Personal history of pulmonary embolism: Secondary | ICD-10-CM | POA: Diagnosis not present

## 2023-02-13 DIAGNOSIS — I3139 Other pericardial effusion (noninflammatory): Secondary | ICD-10-CM | POA: Diagnosis not present

## 2023-02-13 DIAGNOSIS — N189 Chronic kidney disease, unspecified: Secondary | ICD-10-CM

## 2023-02-13 DIAGNOSIS — Z87891 Personal history of nicotine dependence: Secondary | ICD-10-CM | POA: Diagnosis not present

## 2023-02-13 DIAGNOSIS — J4489 Other specified chronic obstructive pulmonary disease: Secondary | ICD-10-CM | POA: Diagnosis present

## 2023-02-13 DIAGNOSIS — E663 Overweight: Secondary | ICD-10-CM | POA: Diagnosis present

## 2023-02-13 DIAGNOSIS — Z7982 Long term (current) use of aspirin: Secondary | ICD-10-CM | POA: Diagnosis not present

## 2023-02-13 DIAGNOSIS — E1122 Type 2 diabetes mellitus with diabetic chronic kidney disease: Secondary | ICD-10-CM | POA: Diagnosis not present

## 2023-02-13 DIAGNOSIS — E876 Hypokalemia: Secondary | ICD-10-CM

## 2023-02-13 DIAGNOSIS — Z79899 Other long term (current) drug therapy: Secondary | ICD-10-CM | POA: Diagnosis not present

## 2023-02-13 DIAGNOSIS — N185 Chronic kidney disease, stage 5: Secondary | ICD-10-CM | POA: Diagnosis not present

## 2023-02-13 DIAGNOSIS — I252 Old myocardial infarction: Secondary | ICD-10-CM | POA: Diagnosis not present

## 2023-02-13 DIAGNOSIS — I5032 Chronic diastolic (congestive) heart failure: Secondary | ICD-10-CM | POA: Diagnosis present

## 2023-02-13 DIAGNOSIS — I5031 Acute diastolic (congestive) heart failure: Secondary | ICD-10-CM | POA: Diagnosis not present

## 2023-02-13 DIAGNOSIS — I2510 Atherosclerotic heart disease of native coronary artery without angina pectoris: Secondary | ICD-10-CM | POA: Diagnosis present

## 2023-02-13 DIAGNOSIS — E785 Hyperlipidemia, unspecified: Secondary | ICD-10-CM | POA: Diagnosis present

## 2023-02-13 DIAGNOSIS — Z5986 Financial insecurity: Secondary | ICD-10-CM | POA: Diagnosis not present

## 2023-02-13 HISTORY — DX: Acute respiratory distress: R06.03

## 2023-02-13 LAB — RENAL FUNCTION PANEL
Albumin: 3 g/dL — ABNORMAL LOW (ref 3.5–5.0)
Anion gap: 11 (ref 5–15)
BUN: 48 mg/dL — ABNORMAL HIGH (ref 6–20)
CO2: 21 mmol/L — ABNORMAL LOW (ref 22–32)
Calcium: 8.3 mg/dL — ABNORMAL LOW (ref 8.9–10.3)
Chloride: 106 mmol/L (ref 98–111)
Creatinine, Ser: 4.8 mg/dL — ABNORMAL HIGH (ref 0.61–1.24)
GFR, Estimated: 14 mL/min — ABNORMAL LOW (ref >60)
Glucose, Bld: 97 mg/dL (ref 70–99)
Phosphorus: 5 mg/dL — ABNORMAL HIGH (ref 2.5–4.6)
Potassium: 3.4 mmol/L — ABNORMAL LOW (ref 3.5–5.1)
Sodium: 138 mmol/L (ref 135–145)

## 2023-02-13 LAB — COMPREHENSIVE METABOLIC PANEL
ALT: 32 U/L (ref 0–44)
AST: 28 U/L (ref 15–41)
Albumin: 2.7 g/dL — ABNORMAL LOW (ref 3.5–5.0)
Alkaline Phosphatase: 100 U/L (ref 38–126)
Anion gap: 10 (ref 5–15)
BUN: 53 mg/dL — ABNORMAL HIGH (ref 6–20)
CO2: 21 mmol/L — ABNORMAL LOW (ref 22–32)
Calcium: 8.1 mg/dL — ABNORMAL LOW (ref 8.9–10.3)
Chloride: 106 mmol/L (ref 98–111)
Creatinine, Ser: 4.79 mg/dL — ABNORMAL HIGH (ref 0.61–1.24)
GFR, Estimated: 14 mL/min — ABNORMAL LOW (ref >60)
Glucose, Bld: 123 mg/dL — ABNORMAL HIGH (ref 70–99)
Potassium: 3.2 mmol/L — ABNORMAL LOW (ref 3.5–5.1)
Sodium: 137 mmol/L (ref 135–145)
Total Bilirubin: 1.1 mg/dL (ref 0.3–1.2)
Total Protein: 6.2 g/dL — ABNORMAL LOW (ref 6.5–8.1)

## 2023-02-13 LAB — CBC WITH DIFFERENTIAL/PLATELET
Abs Immature Granulocytes: 0.03 10*3/uL (ref 0.00–0.07)
Basophils Absolute: 0 10*3/uL (ref 0.0–0.1)
Basophils Relative: 1 %
Eosinophils Absolute: 0.3 10*3/uL (ref 0.0–0.5)
Eosinophils Relative: 5 %
HCT: 27.3 % — ABNORMAL LOW (ref 39.0–52.0)
Hemoglobin: 8.8 g/dL — ABNORMAL LOW (ref 13.0–17.0)
Immature Granulocytes: 1 %
Lymphocytes Relative: 20 %
Lymphs Abs: 1.2 10*3/uL (ref 0.7–4.0)
MCH: 22.2 pg — ABNORMAL LOW (ref 26.0–34.0)
MCHC: 32.2 g/dL (ref 30.0–36.0)
MCV: 68.9 fL — ABNORMAL LOW (ref 80.0–100.0)
Monocytes Absolute: 0.4 10*3/uL (ref 0.1–1.0)
Monocytes Relative: 7 %
Neutro Abs: 4.1 10*3/uL (ref 1.7–7.7)
Neutrophils Relative %: 66 %
Platelets: 190 10*3/uL (ref 150–400)
RBC: 3.96 MIL/uL — ABNORMAL LOW (ref 4.22–5.81)
RDW: 18 % — ABNORMAL HIGH (ref 11.5–15.5)
WBC: 6 10*3/uL (ref 4.0–10.5)
nRBC: 0 % (ref 0.0–0.2)

## 2023-02-13 LAB — PROTIME-INR
INR: 1 (ref 0.8–1.2)
Prothrombin Time: 13.4 seconds (ref 11.4–15.2)

## 2023-02-13 LAB — TROPONIN I (HIGH SENSITIVITY)
Troponin I (High Sensitivity): 31 ng/L — ABNORMAL HIGH (ref <18)
Troponin I (High Sensitivity): 31 ng/L — ABNORMAL HIGH (ref <18)

## 2023-02-13 LAB — BRAIN NATRIURETIC PEPTIDE: B Natriuretic Peptide: 740.9 pg/mL — ABNORMAL HIGH (ref 0.0–100.0)

## 2023-02-13 IMAGING — US US RENAL
1 series · 13 of 25 positions shown · non-contrast
Comparison: None.

CLINICAL DATA: Stage 3 chronic kidney disease.

EXAM:
RENAL / URINARY TRACT ULTRASOUND COMPLETE

[Series 1: us renal · 0.23mm/px · 13 of 45 slices shown]
[im 1/45]
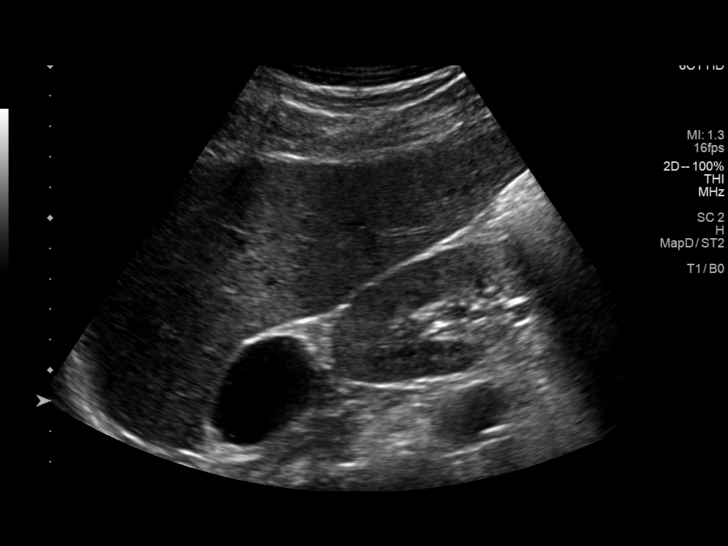
[im 4/45]
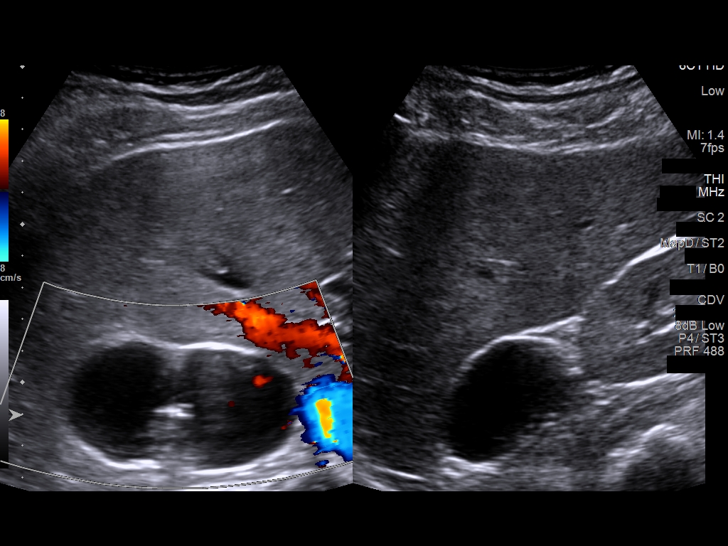
[im 8/45]
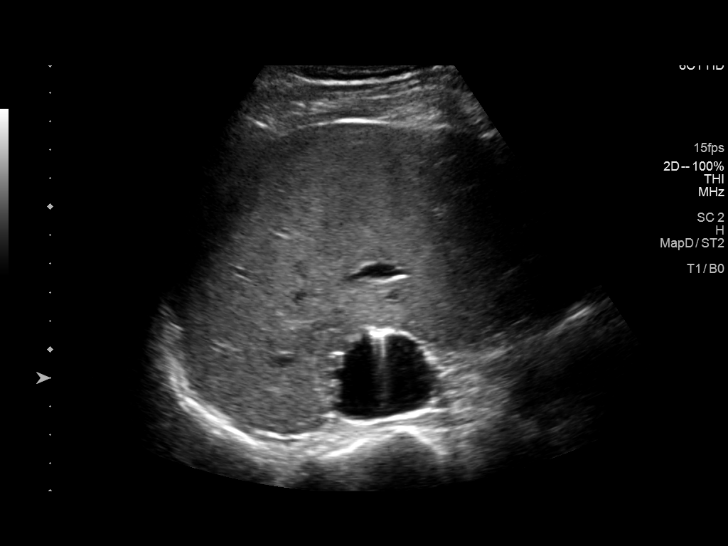
[im 12/45]
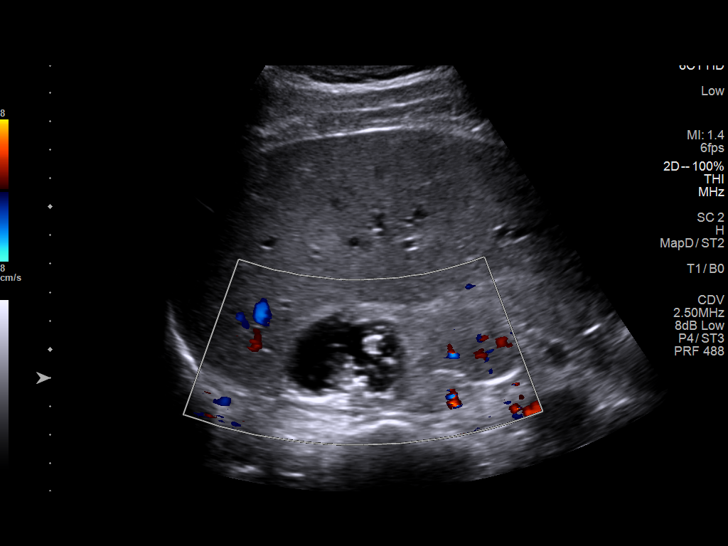
[im 15/45]
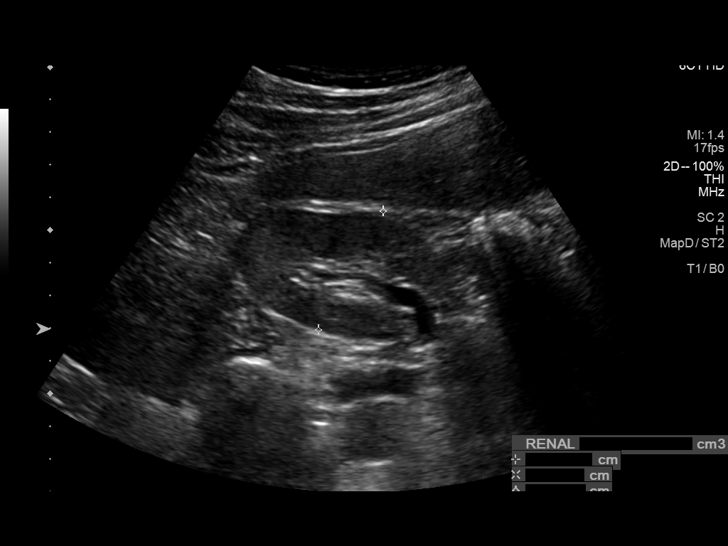
[im 19/45]
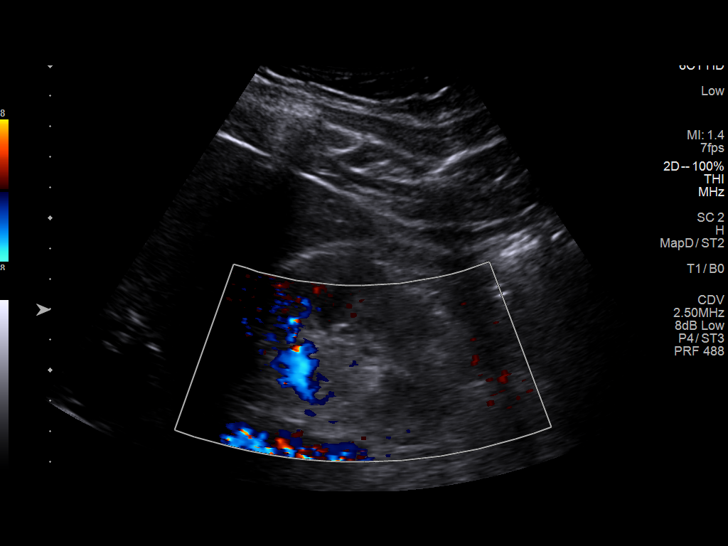
[im 23/45]
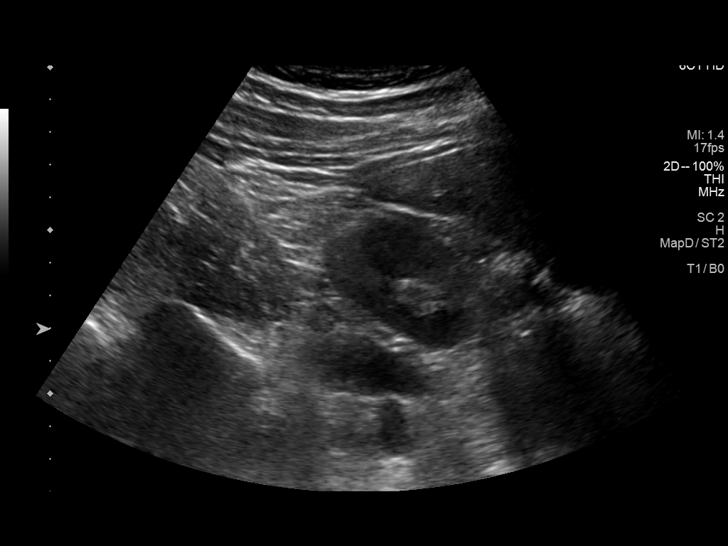
[im 26/45]
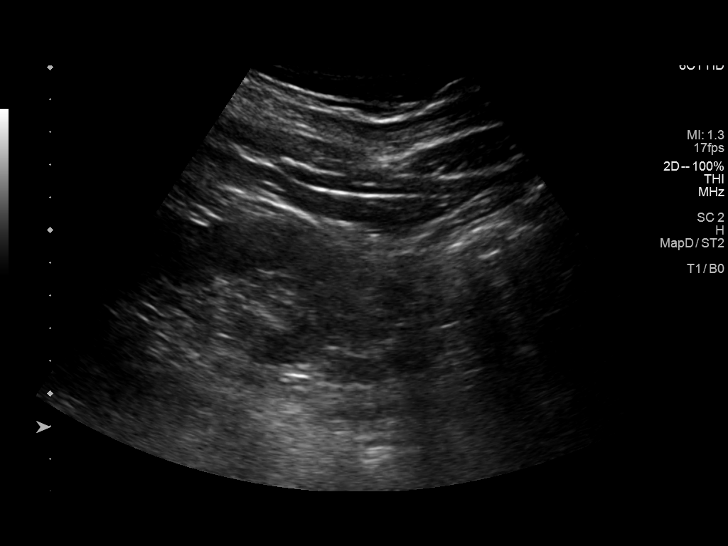
[im 30/45]
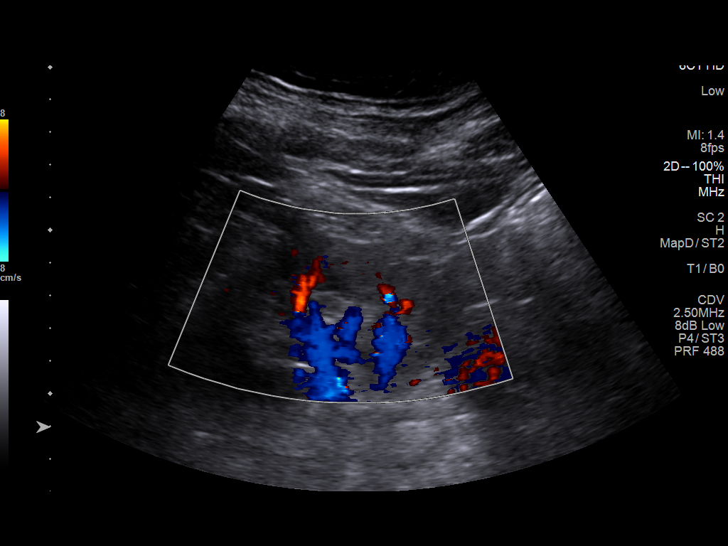
[im 34/45]
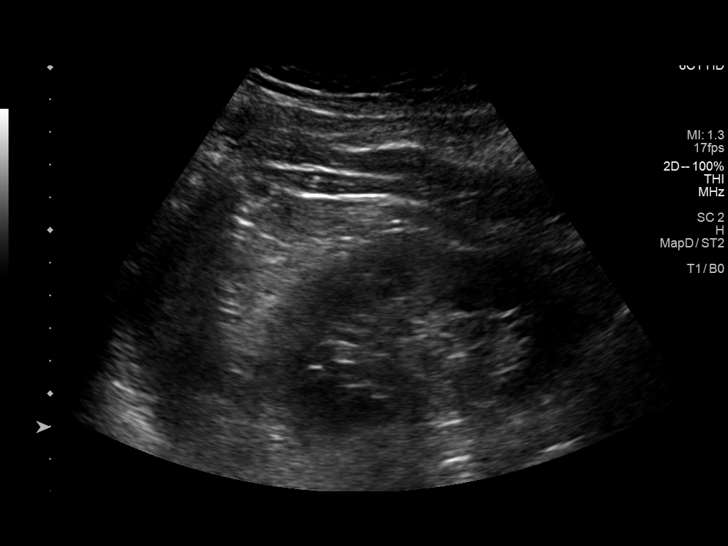
[im 37/45]
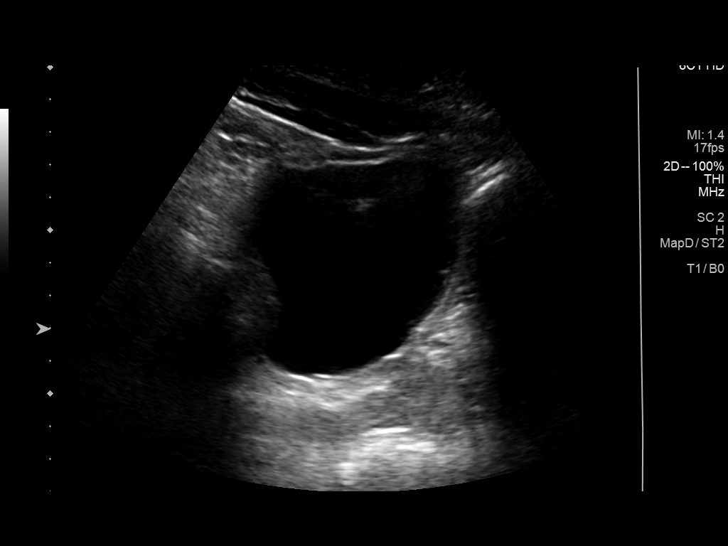
[im 41/45]
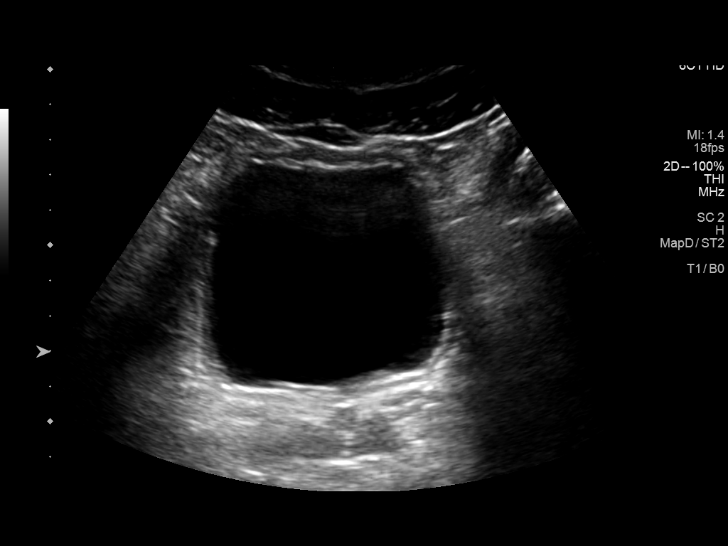
[im 45/45]
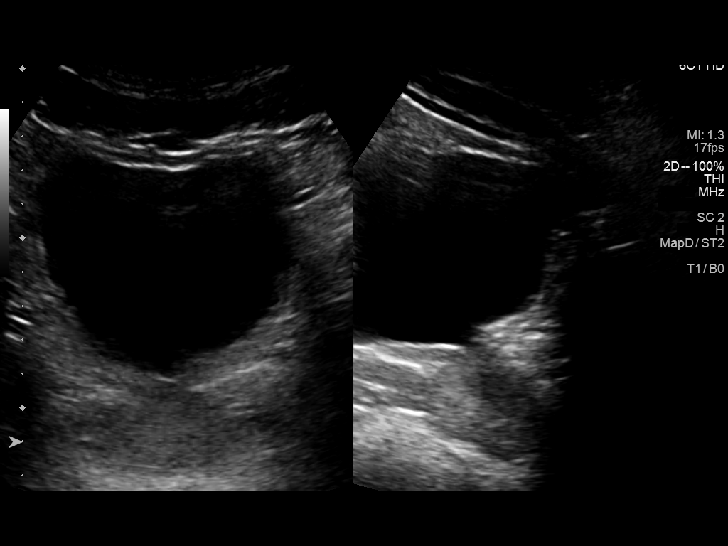

[13 of 25 positions shown; findings below may reference images not displayed]

FINDINGS: Right Kidney:

Renal measurements: 10.4 x 4.6 x 4.1 cm = volume: 105 mL. Mildly
echogenic renal parenchyma, normal thickness. No hydronephrosis. No
renal mass.

Left Kidney:

Renal measurements: 10.1 x 5.1 x 5.1 cm = volume: 135 mL. Mildly
echogenic renal parenchyma, normal thickness. No hydronephrosis. No
renal mass.

Bladder:

Appears normal for degree of bladder distention.

Other:

There is a complex 8.2 x 4.0 x 3.4 cm high right retroperitoneal
cystic mass with thickened internal septation and scattered regions
of ring down artifact, without appreciable communication with right
kidney.
IMPRESSION: 1. Complex 8.2 x 4.0 x 3.4 cm cystic high right retroperitoneal mass
in the expected location of the right adrenal gland, without
appreciable communication with the right kidney. Differential
includes old hematoma or cystic neoplasm. Further evaluation with
adrenal protocol CT abdomen without and with IV contrast
recommended.
2. Mildly echogenic normal size kidneys, compatible with reported
nonspecific chronic renal parenchymal disease.
3. No hydronephrosis.

These results will be called to the ordering clinician or
representative by the Radiologist Assistant, and communication
documented in the PACS or [REDACTED].

## 2023-02-13 MED ORDER — SODIUM CHLORIDE 0.9 % IV SOLN: 250.0000 mL | INTRAVENOUS | Status: DC | PRN

## 2023-02-13 MED ORDER — FUROSEMIDE 10 MG/ML IJ SOLN
40.0000 mg | Freq: Once | INTRAMUSCULAR | Status: AC
  Administered 2023-02-13: 40 mg via INTRAVENOUS
  Filled 2023-02-13: qty 4

## 2023-02-13 MED ORDER — AMLODIPINE BESYLATE 5 MG PO TABS
10.0000 mg | ORAL_TABLET | Freq: Once | ORAL | Status: AC
  Administered 2023-02-13: 10 mg via ORAL
  Filled 2023-02-13: qty 2

## 2023-02-13 MED ORDER — SODIUM CHLORIDE 0.9% FLUSH
3.0000 mL | Freq: Two times a day (BID) | INTRAVENOUS | Status: DC
  Administered 2023-02-13 – 2023-02-15 (×4): 3 mL via INTRAVENOUS

## 2023-02-13 MED ORDER — AMLODIPINE BESYLATE 10 MG PO TABS
10.0000 mg | ORAL_TABLET | Freq: Every day | ORAL | Status: DC
  Administered 2023-02-14 – 2023-02-15 (×2): 10 mg via ORAL
  Filled 2023-02-13 (×2): qty 1

## 2023-02-13 MED ORDER — HYDRALAZINE HCL 20 MG/ML IJ SOLN
10.0000 mg | INTRAMUSCULAR | Status: DC | PRN
  Administered 2023-02-14: 10 mg via INTRAVENOUS
  Filled 2023-02-13: qty 1

## 2023-02-13 MED ORDER — ACETAMINOPHEN 325 MG PO TABS: 650.0000 mg | ORAL_TABLET | ORAL | Status: DC | PRN

## 2023-02-13 MED ORDER — SODIUM CHLORIDE 0.9% FLUSH
3.0000 mL | INTRAVENOUS | Status: DC | PRN
  Administered 2023-02-13: 3 mL via INTRAVENOUS

## 2023-02-13 MED ORDER — FUROSEMIDE 10 MG/ML IJ SOLN
40.0000 mg | Freq: Two times a day (BID) | INTRAMUSCULAR | Status: DC
  Administered 2023-02-13 – 2023-02-14 (×3): 40 mg via INTRAVENOUS
  Filled 2023-02-13 (×3): qty 4

## 2023-02-13 MED ORDER — ONDANSETRON HCL 4 MG/2ML IJ SOLN: 4.0000 mg | Freq: Four times a day (QID) | INTRAMUSCULAR | Status: DC | PRN

## 2023-02-13 MED ORDER — LABETALOL HCL 5 MG/ML IV SOLN
10.0000 mg | Freq: Once | INTRAVENOUS | Status: AC
  Administered 2023-02-13: 10 mg via INTRAVENOUS
  Filled 2023-02-13: qty 4

## 2023-02-13 MED ORDER — PNEUMOCOCCAL 20-VAL CONJ VACC 0.5 ML IM SUSY
0.5000 mL | PREFILLED_SYRINGE | INTRAMUSCULAR | Status: AC
  Administered 2023-02-15: 0.5 mL via INTRAMUSCULAR
  Filled 2023-02-13: qty 0.5

## 2023-02-13 MED ORDER — HEPARIN SODIUM (PORCINE) 5000 UNIT/ML IJ SOLN
5000.0000 [IU] | Freq: Three times a day (TID) | INTRAMUSCULAR | Status: DC
  Administered 2023-02-13 – 2023-02-15 (×5): 5000 [IU] via SUBCUTANEOUS
  Filled 2023-02-13 (×5): qty 1

## 2023-02-13 MED ORDER — POTASSIUM CHLORIDE CRYS ER 20 MEQ PO TBCR
40.0000 meq | EXTENDED_RELEASE_TABLET | ORAL | Status: AC
  Administered 2023-02-13: 40 meq via ORAL
  Filled 2023-02-13: qty 2

## 2023-02-13 MED ORDER — HYDRALAZINE HCL 20 MG/ML IJ SOLN
10.0000 mg | Freq: Once | INTRAMUSCULAR | Status: AC
  Administered 2023-02-13: 10 mg via INTRAVENOUS
  Filled 2023-02-13: qty 1

## 2023-02-13 MED ORDER — CARVEDILOL 25 MG PO TABS
50.0000 mg | ORAL_TABLET | Freq: Two times a day (BID) | ORAL | Status: DC
  Administered 2023-02-13 – 2023-02-15 (×5): 50 mg via ORAL
  Filled 2023-02-13 (×2): qty 2
  Filled 2023-02-13: qty 4
  Filled 2023-02-13 (×2): qty 2

## 2023-02-13 MED ORDER — NITROGLYCERIN 2 % TD OINT
1.0000 [in_us] | TOPICAL_OINTMENT | Freq: Once | TRANSDERMAL | Status: AC
  Administered 2023-02-13: 1 [in_us] via TOPICAL
  Filled 2023-02-13: qty 1

## 2023-02-13 NOTE — ED Notes (Signed)
1200 urine output

## 2023-02-13 NOTE — H&P (Signed)
History and Physical    Patient: George Day ZOX:[id number] DOB: Apr 08, 1976 DOA: 02/12/2023 DOS: the patient was seen and examined on 02/13/2023 PCP: Ngetich, Donalee Citrin, NP  Patient coming from: Home  Chief Complaint:  Chief Complaint  Patient presents with   Chest Pain   HPI: George Day is a 47 y.o. male with medical history significant of hypertension, CAD, diastolic CHF, asthma/COPD, TIA, mass, pulmonary embolism, adrenal mass, and CKD stage IV who presented with complaints of chest pain.  Patient reports waking up with left-sided chest pressure and tightness feeling, but states that it seemed to improve assignment was bearable.  He took a nap around 3 PM and woke up at 6 PM in severe pain with reports of it feeling like somebody was choking him.  Pain radiated to the left arm.  Denies having any recent fever, cough, nausea, vomiting, diarrhea, or recent sick contacts.  He had taken his home blood pressure medicines with reports of blood pressures improving, but for pain persisted.  He reports that he has noticed some lower extremity swelling here recently.  His wife interjects and makes note that he for the last week and a half he had been without his torsemide for the last week and a half.  Patient makes note that has a follow-up appointment with Dr. Allyson Sabal scheduled for 7/2.  Patient reports not being on oxygen at baseline.  During route with EMS patient had been given full dose aspirin, nitroglycerin, and morphine 6 mg IV.  In the emergency department patient was noted to be afebrile with tachypnea, blood pressures elevated up to 200/111, and O2 saturations maintained on room air.  Labs from 6/25 significant for BNP 740.9, high-sensitivity troponin 31->31, hemoglobin 8.8, potassium 3.2, BUN 53, and creatinine 4.79.  Chest x-ray noted cardiomegaly with pulmonary edema and small pleural effusions consistent with CHF.  Patient has been given Lasix 40 mg IV, nitroglycerin ointment, labetalol 10  mg IV, hydralazine 10 mg IV, and amlodipine 10 mg p.o.  Review of Systems: As mentioned in the history of present illness. All other systems reviewed and are negative. Past Medical History:  Diagnosis Date   Anemia 1998   Anginal pain (HCC)    Anxiety    Asthma    Coronary artery disease    Diabetes mellitus without complication (HCC) 1995   Hypertension 2019   MI (myocardial infarction) (HCC) 2010   Sleep apnea    Stroke [facility]) 2010   Past Surgical History:  Procedure Laterality Date   BIOPSY  08/18/2022   Procedure: BIOPSY;  Surgeon: Imogene Burn, MD;  Location: [facility] ENDOSCOPY;  Service: Gastroenterology;;   CARDIAC CATHETERIZATION  2010   COLONOSCOPY WITH PROPOFOL N/A 08/18/2022   Procedure: COLONOSCOPY WITH PROPOFOL;  Surgeon: Imogene Burn, MD;  Location: [facility] ENDOSCOPY;  Service: Gastroenterology;  Laterality: N/A;   ESOPHAGOGASTRODUODENOSCOPY (EGD) WITH PROPOFOL N/A 08/18/2022   Procedure: ESOPHAGOGASTRODUODENOSCOPY (EGD) WITH PROPOFOL;  Surgeon: Imogene Burn, MD;  Location: [facility] ENDOSCOPY;  Service: Gastroenterology;  Laterality: N/A;   POLYPECTOMY  08/18/2022   Procedure: POLYPECTOMY;  Surgeon: Imogene Burn, MD;  Location: [facility] ENDOSCOPY;  Service: Gastroenterology;;   Social History:  reports that he quit smoking about 16 months ago. His smoking use included cigars, e-cigarettes, and cigarettes. He has a 15.50 pack-year smoking history. He has never used smokeless tobacco. He reports that he does not currently use alcohol. He reports that he does not currently use drugs after having used the following drugs: Marijuana.  Allergies  Allergen Reactions   Honey Bee Venom [Bee Venom] Anaphylaxis   Penicillins Anaphylaxis    ALL   Tomato Swelling    Family History  Problem Relation Age of Onset   Diabetes Mother    Hyperlipidemia Mother    Diabetes Sister    Hyperlipidemia Sister    Hearing loss Sister    Diabetes Brother    Hyperlipidemia Brother     Prior to  Admission medications   Medication Sig Start Date End Date Taking? Authorizing Provider  albuterol (VENTOLIN HFA) 108 (90 Base) MCG/ACT inhaler Inhale 1-2 puffs into the lungs every 2 (two) hours as needed for wheezing or shortness of breath. 09/20/22   Levin Erp, MD  amLODipine (NORVASC) 10 MG tablet Take 1 tablet (10 mg total) by mouth daily. 11/21/22   Ngetich, Dinah C, NP  aspirin EC 81 MG tablet Take 1 tablet (81 mg total) by mouth daily. 11/21/22   Ngetich, Dinah C, NP  atorvastatin (LIPITOR) 40 MG tablet Take 1 tablet (40 mg total) by mouth daily. 11/21/22   Ngetich, Dinah C, NP  carvedilol (COREG) 25 MG tablet Take 2 tablets (50 mg total) by mouth 2 (two) times daily. 11/21/22   Ngetich, Dinah C, NP  docusate sodium (COLACE) 100 MG capsule Take 1 capsule (100 mg total) by mouth 2 (two) times daily. 11/21/22   Ngetich, Dinah C, NP  famotidine (PEPCID) 20 MG tablet Take 1 tablet (20 mg total) by mouth daily. 11/21/22   Ngetich, Dinah C, NP  Fluticasone-Umeclidin-Vilant (TRELEGY ELLIPTA) 100-62.5-25 MCG/ACT AEPB Inhale 1 puff into the lungs daily. 11/21/22   Ngetich, Dinah C, NP  nitroGLYCERIN (NITROSTAT) 0.6 MG SL tablet Place 1 tablet (0.6 mg total) under the tongue every 5 (five) minutes as needed for chest pain. 11/21/22   Ngetich, Dinah C, NP  potassium chloride SA (KLOR-CON M) 20 MEQ tablet Take 2 tablets (40 mEq total) by mouth daily. 11/21/22   Ngetich, Dinah C, NP  torsemide (DEMADEX) 20 MG tablet Take 2 tablets (40 mg total) by mouth daily. 11/21/22   Ngetich, Donalee Citrin, NP    Physical Exam: Vitals:   02/13/23 0630 02/13/23 1610 02/13/23 0641 02/13/23 0645  BP: (!) 182/104   (!) 177/106  Pulse: 79  76 78  Resp: 19  17 18   Temp:  98 F (36.7 C)    TempSrc:  Oral    SpO2: 98%  97% 97%   Constitutional: Middle-age male who appears to be in no acute distress Eyes: PERRL, lids and conjunctivae normal ENMT: Mucous membranes are moist.   Neck: normal, supple,   JVD present Respiratory: clear to  auscultation bilaterally, no wheezing, no crackles.   No accessory muscle use.  Cardiovascular: Regular rate and rhythm, no murmurs / rubs / gallops.  Trace bilateral lower extremity edema. 2+ pedal pulses.    Abdomen: no tenderness, no masses palpated. Bowel sounds positive.  Musculoskeletal: no clubbing / cyanosis. No joint deformity upper and lower extremities. Good ROM, no contractures. Normal muscle tone.  Skin: no rashes, lesions, ulcers. No induration Neurologic: CN 2-12 grossly intact.  Strength 5/5 in all 4.  Psychiatric: Normal judgment and insight. Alert and oriented x 3. Normal mood.   Data Reviewed:  EKG reveals sinus rhythm at 82 bpm with left atrial abnormality and LVH.  Reviewed labs, imaging, and pertinent records as documented in this note.  Assessment and Plan:  Acute respiratory distress secondary to diastolic congestive heart failure exacerbation Acute on  chronic.  Patient presented with complaints of chest pain and shortness of breath.  O2 saturations were noted to be maintained but patient placed on 2 L of oxygen for comfort.  BNP elevated at 740.9 which is higher than previous.  Chest x-ray noted cardiomegaly with pulmonary edema and small pleural effusions.  Last echocardiogram noted EF to be 50 - 55% with severe concentric left ventricular hypertrophy in 08/2022.  Thought secondary to patient being out of home medications of torsemide -Admit to cardiac telemetry bed -Heart failure order set utilized -Strict I&Os and daily weights -Check echocardiogram -Lasix 40 mg IV twice daily -Continue beta-blocker -Keep outpatient follow-up with Dr. Allyson Sabal.  Hypertensive urgency Acute.  On admission blood pressures noted to be elevated up to 200/111. -Continue Coreg and amlodipine -Hydralazine IV as needed for elevated blood pressures  Elevated troponin Chronic.  Patient reported complaints of chest pain.  High-sensitivity troponins flat at 31.  EKG without significant  ischemic changes. -Continue to monitor  Acute kidney injury superimposed on chronic kidney disease stage IV Creatinine 4.79 with BUN 53.  Baseline creatinine previously noted to be 4.05 when checked 4/4.  Suspect secondary to hypoperfusion in setting of patient being fluid overloaded -Continue to monitor acute function with diuresis -Formally consult nephrology if kidney function worsens with IV diuresis  Hypokalemia Acute.  Initial potassium noted to be 3.2. -Give potassium chloride 40 meq p.o. -Continue to monitor and replace as needed  Microcytic hypochromic anemia Chronic.  Hemoglobin 8.8 with low MCV and MCH.  Prior iron studies back in January noted low iron  levels. -No reports of blood in stool  Asthma/COPD without acute exacerbation -Continue pharmacy substitution for Trelegy Ellipta -Albuterol nebs as needed    DVT prophylaxis: Heparin Advance Care Planning:   Code Status: Full Code    Consults: Nephrology  Family Communication: Wife updated at bedside  Severity of Illness: The appropriate patient status for this patient is INPATIENT. Inpatient status is judged to be reasonable and necessary in order to provide the required intensity of service to ensure the patient's safety. The patient's presenting symptoms, physical exam findings, and initial radiographic and laboratory data in the context of their chronic comorbidities is felt to place them at high risk for further clinical deterioration. Furthermore, it is not anticipated that the patient will be medically stable for discharge from the hospital within 2 midnights of admission.   * I certify that at the point of admission it is my clinical judgment that the patient will require inpatient hospital care spanning beyond 2 midnights from the point of admission due to high intensity of service, high risk for further deterioration and high frequency of surveillance required.*  Author: Clydie Braun, MD 02/13/2023 7:17  AM  For on call review [email or web address].

## 2023-02-13 NOTE — ED Notes (Signed)
ED TO INPATIENT HANDOFF REPORT  ED Nurse Name and Phone #: [phone]  S Name/Age/Gender George Day 47 y.o. male Room/Bed: 046C/046C  Code Status   Code Status: Full Code  Home/SNF/Other Home Patient oriented to: self, place, time, and situation Is this baseline? Yes   Triage Complete: Triage complete  Chief Complaint Acute on chronic diastolic CHF (congestive heart failure) (HCC) [I50.33]  Triage Note Pt to ED via EMS from home. Pt c/o CP pressure that started at 6pm. Pt also c/o headache earlier that has resolved. Pt endorses nausea, SOB, and blurred vision. Pt took 1 nitroglycerin at home at 6pm and EMS gave 2 more nitroglycerin en route with no relief. EMS also gave 6 of morphine en route. EMS also gave 4 of zofran en route. EMS placed pt on 2L [place] for comfort.    EMS Vitals: 20 L wrist 208/122 164/96 80 HR 130 CBG 96%    Allergies Allergies  Allergen Reactions   Honey Bee Venom [Bee Venom] Anaphylaxis   Penicillins Anaphylaxis    ALL   Tomato Swelling    Level of Care/Admitting Diagnosis ED Disposition     ED Disposition  Admit   Condition  --   Comment  Hospital [facility] [[id number]]  Level of Care: Telemetry Cardiac [[id number]]  May admit patient to [facility] or [facility] if equivalent level of care is available:: No  Covid Evaluation: Asymptomatic - no recent exposure (last 10 days) testing not required  Diagnosis: Acute on chronic diastolic CHF (congestive heart failure) [facility]) [[id number]]  Admitting Physician: Clydie Braun [[id number]]  Attending Physician: Clydie Braun [[id number]]  Certification:: I certify this patient will need inpatient services for at least 2 midnights  Estimated Length of Stay: 2          B Medical/Surgery History Past Medical History:  Diagnosis Date   Anemia 1998   Anginal pain (HCC)    Anxiety    Asthma    Coronary artery disease    Diabetes mellitus without complication (HCC) 1995    Hypertension 2019   MI (myocardial infarction) (HCC) 2010   Sleep apnea    Stroke (HCC) 2010   Past Surgical History:  Procedure Laterality Date   BIOPSY  08/18/2022   Procedure: BIOPSY;  Surgeon: Imogene Burn, MD;  Location[facility] ENDOSCOPY;  Service: Gastroenterology;;   CARDIAC CATHETERIZATION  2010   COLONOSCOPY WITH PROPOFOL N/A 08/18/2022   Procedure: COLONOSCOPY WITH PROPOFOL;  Surgeon: Imogene Burn, MD;  Location: [facility] ENDOSCOPY;  Service: Gastroenterology;  Laterality: N/A;   ESOPHAGOGASTRODUODENOSCOPY (EGD) WITH PROPOFOL N/A 08/18/2022   Procedure: ESOPHAGOGASTRODUODENOSCOPY (EGD) WITH PROPOFOL;  Surgeon: Imogene Burn, MD;  Location: [facility] ENDOSCOPY;  Service: Gastroenterology;  Laterality: N/A;   POLYPECTOMY  08/18/2022   Procedure: POLYPECTOMY;  Surgeon: Imogene Burn, MD;  Location: [facility] ENDOSCOPY;  Service: Gastroenterology;;     A IV Location/Drains/Wounds Patient Lines/Drains/Airways Status     Active Line/Drains/Airways     Name Placement date Placement time Site Days   Peripheral IV 02/12/23 20 G Left;Posterior Wrist 02/12/23  2304  Wrist  1   Peripheral IV 02/12/23 20 G 1" Anterior;Distal;Right;Upper Arm 02/12/23  2337  Arm  1            Intake/Output Last 24 hours  Intake/Output Summary (Last 24 hours) at 02/13/2023 1425 Last data filed at 02/13/2023 0708 Gross per 24 hour  Intake --  Output 2650 ml  Net -2650 ml  Labs/Imaging Results for orders placed or performed during the hospital encounter of 02/12/23 (from the past 48 hour(s))  CBG monitoring, ED     Status: Abnormal   Collection Time: 02/12/23 11:28 PM  Result Value Ref Range   Glucose-Capillary 119 (H) 70 - 99 mg/dL    Comment: Glucose reference range applies only to samples taken after fasting for at least 8 hours.  Troponin I (High Sensitivity)     Status: Abnormal   Collection Time: 02/12/23 11:35 PM  Result Value Ref Range   Troponin I (High Sensitivity) 31 (H) <18 ng/L    Comment:  (NOTE) Elevated high sensitivity troponin I (hsTnI) values and significant  changes across serial measurements may suggest ACS but many other  chronic and acute conditions are known to elevate hsTnI results.  Refer to the "Links" section for chest pain algorithms and additional  guidance. Performed at [facility], [address], [place], [place] [address]   Comprehensive metabolic panel     Status: Abnormal   Collection Time: 02/12/23 11:35 PM  Result Value Ref Range   Sodium 137 135 - 145 mmol/L   Potassium 3.2 (L) 3.5 - 5.1 mmol/L   Chloride 106 98 - 111 mmol/L   CO2 21 (L) 22 - 32 mmol/L   Glucose, Bld 123 (H) 70 - 99 mg/dL    Comment: Glucose reference range applies only to samples taken after fasting for at least 8 hours.   BUN 53 (H) 6 - 20 mg/dL   Creatinine, Ser 8.46 (H) 0.61 - 1.24 mg/dL   Calcium 8.1 (L) 8.9 - 10.3 mg/dL   Total Protein 6.2 (L) 6.5 - 8.1 g/dL   Albumin 2.7 (L) 3.5 - 5.0 g/dL   AST 28 15 - 41 U/L   ALT 32 0 - 44 U/L   Alkaline Phosphatase 100 38 - 126 U/L   Total Bilirubin 1.1 0.3 - 1.2 mg/dL   GFR, Estimated 14 (L) >60 mL/min    Comment: (NOTE) Calculated using the CKD-EPI Creatinine Equation (2021)    Anion gap 10 5 - 15    Comment: Performed at [facility], [address], [place], [place] [address]  Brain natriuretic peptide (order if patient c/o SOB ONLY)     Status: Abnormal   Collection Time: 02/12/23 11:35 PM  Result Value Ref Range   B Natriuretic Peptide 740.9 (H) 0.0 - 100.0 pg/mL    Comment: Performed at [facility], [address], [place], [place] [address]  CBC with Differential/Platelet     Status: Abnormal   Collection Time: 02/12/23 11:35 PM  Result Value Ref Range   WBC 6.0 4.0 - 10.5 K/uL   RBC 3.96 (L) 4.22 - 5.81 MIL/uL   Hemoglobin 8.8 (L) 13.0 - 17.0 g/dL    Comment: Reticulocyte Hemoglobin testing may be clinically indicated, consider ordering this additional test [id number]    HCT 27.3 (L) 39.0 -  52.0 %   MCV 68.9 (L) 80.0 - 100.0 fL   MCH 22.2 (L) 26.0 - 34.0 pg   MCHC 32.2 30.0 - 36.0 g/dL   RDW 25.3 (H) 66.4 - 40.3 %   Platelets 190 150 - 400 K/uL   nRBC 0.0 0.0 - 0.2 %   Neutrophils Relative % 66 %   Neutro Abs 4.1 1.7 - 7.7 K/uL   Lymphocytes Relative 20 %   Lymphs Abs 1.2 0.7 - 4.0 K/uL   Monocytes Relative 7 %  Monocytes Absolute 0.4 0.1 - 1.0 K/uL   Eosinophils Relative 5 %   Eosinophils Absolute 0.3 0.0 - 0.5 K/uL   Basophils Relative 1 %   Basophils Absolute 0.0 0.0 - 0.1 K/uL   Immature Granulocytes 1 %   Abs Immature Granulocytes 0.03 0.00 - 0.07 K/uL   Polychromasia PRESENT     Comment: Performed at [facility], [address], [place], [place] [address]  Protime-INR     Status: None   Collection Time: 02/12/23 11:35 PM  Result Value Ref Range   Prothrombin Time 13.4 11.4 - 15.2 seconds   INR 1.0 0.8 - 1.2    Comment: (NOTE) INR goal varies based on device and disease states. Performed at [facility], [address] [place], [place] [address]   I-stat chem 8, ED     Status: Abnormal   Collection Time: 02/12/23 11:46 PM  Result Value Ref Range   Sodium 140 135 - 145 mmol/L   Potassium 3.2 (L) 3.5 - 5.1 mmol/L   Chloride 107 98 - 111 mmol/L   BUN 48 (H) 6 - 20 mg/dL   Creatinine, Ser 0.98 (H) 0.61 - 1.24 mg/dL   Glucose, Bld 119 (H) 70 - 99 mg/dL    Comment: Glucose reference range applies only to samples taken after fasting for at least 8 hours.   Calcium, Ion 1.13 (L) 1.15 - 1.40 mmol/L   TCO2 21 (L) 22 - 32 mmol/L   Hemoglobin 11.2 (L) 13.0 - 17.0 g/dL   HCT 14.7 (L) 82.9 - 56.2 %  Troponin I (High Sensitivity)     Status: Abnormal   Collection Time: 02/13/23  1:25 AM  Result Value Ref Range   Troponin I (High Sensitivity) 31 (H) <18 ng/L    Comment: (NOTE) Elevated high sensitivity troponin I (hsTnI) values and significant  changes across serial measurements may suggest ACS but many other  chronic and acute conditions are  known to elevate hsTnI results.  Refer to the "Links" section for chest pain algorithms and additional  guidance. Performed at [facility], [address], [place], [place] [address]    DG Chest Portable 1 View  Result Date: 02/12/2023 CLINICAL DATA:  Chest pain. EXAM: PORTABLE CHEST 1 VIEW COMPARISON:  Radiograph 09/17/2022, CT 11/01/2022 FINDINGS: The heart is enlarged, progressive. Interstitial and septal thickening typical of pulmonary edema. Small bilateral pleural effusions suspected. No evidence of confluent consolidation. No pneumothorax. On limited assessment, no acute osseous findings. IMPRESSION: Cardiomegaly with pulmonary edema and small pleural effusions consistent with CHF. Electronically Signed   By: Narda Rutherford M.D.   On: 02/12/2023 23:38    Pending Labs Unresulted Labs (From admission, onward)     Start     Ordered   02/14/23 0500  CBC  Tomorrow morning,   R        02/13/23 1349   02/13/23 1350  Renal function panel  Daily,   R      02/13/23 1349            Vitals/Pain Today's Vitals   02/13/23 1107 02/13/23 1130 02/13/23 1300 02/13/23 1330  BP: (!) 152/90 (!) 162/97 (!) 182/106 (!) 173/113  Pulse: 68 67 74 70  Resp: 17 17 (!) 22 17  Temp:      TempSrc:      SpO2: 100% 100% 100% 100%  PainSc:        Isolation Precautions No active isolations  Medications Medications  carvedilol (COREG) tablet 50 mg (50 mg Oral Given 02/13/23 0923)  sodium chloride flush (NS) 0.9 % injection 3 mL (0 mLs Intravenous Hold 02/13/23 0936)  sodium chloride flush (NS) 0.9 % injection 3 mL (3 mLs Intravenous Given 02/13/23 0926)  0.9 %  sodium chloride infusion (has no administration in time range)  acetaminophen (TYLENOL) tablet 650 mg (has no administration in time range)  ondansetron (ZOFRAN) injection 4 mg (has no administration in time range)  amLODipine (NORVASC) tablet 10 mg (has no administration in time range)  heparin injection 5,000 Units (5,000 Units  Subcutaneous Patient Refused/Not Given 02/13/23 1230)  furosemide (LASIX) injection 40 mg (40 mg Intravenous Given 02/13/23 0924)  hydrALAZINE (APRESOLINE) injection 10 mg (has no administration in time range)  nitroGLYCERIN (NITROGLYN) 2 % ointment 1 inch (1 inch Topical Given 02/12/23 2347)  furosemide (LASIX) injection 40 mg (40 mg Intravenous Given 02/13/23 0032)  amLODipine (NORVASC) tablet 10 mg (10 mg Oral Given 02/13/23 0121)  hydrALAZINE (APRESOLINE) injection 10 mg (10 mg Intravenous Given 02/13/23 0259)  labetalol (NORMODYNE) injection 10 mg (10 mg Intravenous Given 02/13/23 0259)  furosemide (LASIX) injection 40 mg (40 mg Intravenous Given 02/13/23 0259)  nitroGLYCERIN (NITROGLYN) 2 % ointment 1 inch (1 inch Topical Given 02/13/23 0701)  potassium chloride SA (KLOR-CON M) CR tablet 40 mEq (40 mEq Oral Given 02/13/23 3664)    Mobility walks with person assist     Focused Assessments Cardiac Assessment Handoff:  Cardiac Rhythm: Normal sinus rhythm No results found for: "CKTOTAL", "CKMB", "CKMBINDEX", "TROPONINI" No results found for: "DDIMER" Does the Patient currently have chest pain? No    R Recommendations: See Admitting Provider Note  Report given to:   Additional Notes:

## 2023-02-14 ENCOUNTER — Encounter (HOSPITAL_COMMUNITY): Payer: Self-pay | Admitting: Internal Medicine

## 2023-02-14 ENCOUNTER — Inpatient Hospital Stay (HOSPITAL_COMMUNITY): Payer: 59

## 2023-02-14 DIAGNOSIS — I5031 Acute diastolic (congestive) heart failure: Secondary | ICD-10-CM | POA: Diagnosis not present

## 2023-02-14 DIAGNOSIS — I160 Hypertensive urgency: Secondary | ICD-10-CM | POA: Diagnosis not present

## 2023-02-14 DIAGNOSIS — I5033 Acute on chronic diastolic (congestive) heart failure: Secondary | ICD-10-CM | POA: Diagnosis not present

## 2023-02-14 DIAGNOSIS — D509 Iron deficiency anemia, unspecified: Secondary | ICD-10-CM | POA: Diagnosis not present

## 2023-02-14 DIAGNOSIS — N179 Acute kidney failure, unspecified: Secondary | ICD-10-CM | POA: Diagnosis not present

## 2023-02-14 LAB — CBC
HCT: 26 % — ABNORMAL LOW (ref 39.0–52.0)
Hemoglobin: 8.4 g/dL — ABNORMAL LOW (ref 13.0–17.0)
MCH: 22.3 pg — ABNORMAL LOW (ref 26.0–34.0)
MCHC: 32.3 g/dL (ref 30.0–36.0)
MCV: 69 fL — ABNORMAL LOW (ref 80.0–100.0)
Platelets: 186 10*3/uL (ref 150–400)
RBC: 3.77 MIL/uL — ABNORMAL LOW (ref 4.22–5.81)
RDW: 17.9 % — ABNORMAL HIGH (ref 11.5–15.5)
WBC: 5.8 10*3/uL (ref 4.0–10.5)
nRBC: 0 % (ref 0.0–0.2)

## 2023-02-14 LAB — RENAL FUNCTION PANEL
Albumin: 2.6 g/dL — ABNORMAL LOW (ref 3.5–5.0)
Anion gap: 8 (ref 5–15)
BUN: 48 mg/dL — ABNORMAL HIGH (ref 6–20)
CO2: 22 mmol/L (ref 22–32)
Calcium: 8 mg/dL — ABNORMAL LOW (ref 8.9–10.3)
Chloride: 105 mmol/L (ref 98–111)
Creatinine, Ser: 4.91 mg/dL — ABNORMAL HIGH (ref 0.61–1.24)
GFR, Estimated: 14 mL/min — ABNORMAL LOW (ref >60)
Glucose, Bld: 136 mg/dL — ABNORMAL HIGH (ref 70–99)
Phosphorus: 4.8 mg/dL — ABNORMAL HIGH (ref 2.5–4.6)
Potassium: 2.9 mmol/L — ABNORMAL LOW (ref 3.5–5.1)
Sodium: 135 mmol/L (ref 135–145)

## 2023-02-14 LAB — BASIC METABOLIC PANEL
Anion gap: 12 (ref 5–15)
BUN: 47 mg/dL — ABNORMAL HIGH (ref 6–20)
CO2: 23 mmol/L (ref 22–32)
Calcium: 8.4 mg/dL — ABNORMAL LOW (ref 8.9–10.3)
Chloride: 103 mmol/L (ref 98–111)
Creatinine, Ser: 5.19 mg/dL — ABNORMAL HIGH (ref 0.61–1.24)
GFR, Estimated: 13 mL/min — ABNORMAL LOW (ref >60)
Glucose, Bld: 102 mg/dL — ABNORMAL HIGH (ref 70–99)
Potassium: 3.4 mmol/L — ABNORMAL LOW (ref 3.5–5.1)
Sodium: 138 mmol/L (ref 135–145)

## 2023-02-14 LAB — ECHOCARDIOGRAM COMPLETE
AR max vel: 2.61 cm2
AV Peak grad: 10.2 mmHg
Ao pk vel: 1.6 m/s
Area-P 1/2: 4.8 cm2
Height: 69 in
MV M vel: 3.15 m/s
MV Peak grad: 39.7 mmHg
S' Lateral: 3.3 cm
Weight: 3168 oz

## 2023-02-14 MED ORDER — POTASSIUM CHLORIDE CRYS ER 20 MEQ PO TBCR
40.0000 meq | EXTENDED_RELEASE_TABLET | Freq: Once | ORAL | Status: AC
  Administered 2023-02-14: 40 meq via ORAL
  Filled 2023-02-14: qty 2

## 2023-02-14 MED ORDER — HYDROCORTISONE 1 % EX CREA
TOPICAL_CREAM | Freq: Two times a day (BID) | CUTANEOUS | Status: DC
  Filled 2023-02-14: qty 28

## 2023-02-14 MED ORDER — ISOSORB DINITRATE-HYDRALAZINE 20-37.5 MG PO TABS
1.0000 | ORAL_TABLET | Freq: Three times a day (TID) | ORAL | Status: DC
  Administered 2023-02-14 – 2023-02-15 (×3): 1 via ORAL
  Filled 2023-02-14 (×3): qty 1

## 2023-02-14 NOTE — Assessment & Plan Note (Signed)
Old records personally reviewed, neurology follow up from 05/2021. Seropositive ocular myasthenia gravis, no significant bulbar, or limb weakness noted.  He is not longer on Mestinon or prednisone, never treated with long term steroids sparing agent.   

## 2023-02-14 NOTE — Assessment & Plan Note (Signed)
Follow up cell count.  

## 2023-02-14 NOTE — Progress Notes (Signed)
Heart Failure Stewardship Pharmacist Progress Note   PCP: Ngetich, Donalee Citrin, NP PCP-Cardiologist: Nanetta Batty, MD    HPI:  [name] with PMH of CAD, T2DM, CKD IV, adrenal mass, anemia, HTN, HLD, OSA on CPAP, asthma, MS and diastolic HF. Also has history of MI in 2010 with no stent, CVA 2010.   Admitted 12/21 with CP. HsTroponin flat not consistent with ACS. Echo showed EF 50 to 55%, no RWMA, grade 2 DD. Blood pressure was markedly elevated during this admission, borderline elevated troponin was felt to be related to high blood pressure. Renal artery duplex was normal. Started on BP medication and discharged home.   He had regular follow up with General Cardiology. Had chest pain and underwent Myoview (1/22) showed EF 42%, ST segment depression noted in lead II, III, aVF and V6, no evidence of ischemia or infarction on the Myoview perfusion study, intermediate risk study due to moderately reduced LVEF. Echo 12/21 showed normal EF, elected not to repeat echo.   Found to have R adrenal gland mass (DDx of old hematoma vs cystic neoplasm) in 2/22. He was referred by Endocrinology to Dr. Duanne Guess for surgical excision.   Admitted 7/22 with HA, blurred vision and HTN urgency. BP was 213/126. Required ICU care and nicardipine gtt. Neurology consulted and MRI showed true cervical spinal cord lesions suggestive of demyelinating disease. He was started on steroid therapy with improvement in neuro symptoms. Drips weaned and placed on oral BP meds. Nephrology consulted with SCr 2.93>>3.4. He was discharged with Neurology and Nephrology follow up.   Admitted 12/23 with GIB. Underwent colonoscopy showing non-bleeding duodenal ulcer and a rectal polyp. Started on PPI and discharged with GI follow up. Admitted 1/24 with a/c dHF, had been off of his meds. BP was 235/127 on admission. He was diuresed with IV lasix and home BP meds restarted. Echo showed EF 50-55%, severe concentric LVH, RV normal,  moderate pericardial effusion. Nephrology consulted as his SCr 4.10 on admission, no indication for HD catheter placement. He was discharged home, weight 196 lbs.    He was seen in [facility] last 2/22. Volume overloaded. Torsemide was increased to 80 mg daily and started on amlodipine. He was not able to do that because he lost housing.   Seen back in [facility] 3/13. Unable to weigh. Taking all medications but did not pick up amlodipine. He plugs in CPAP when he can. He is homeless with his wife and 5 children living in  his car. He gets food stamps and his children's school helps with gas cards. Started back on amlodipine. Unfortunately, was a no show to follow up TOC appt. Underwent cardiac MRI on 5/19, LVEF 49%. Borderline elevated extracellular volume percentage, suspect increased myocardial fibrosis in the setting of hypertension.   Presented to the ED on 6/25 with chest pain, shortness of breath, LE edmea, blurred vision, headache, and nausea. Reported being out of his torsemide for 1 week. States he is otherwise compliant with medications. In the ED, BP was 200/111, BNP 740, hstrop 31. CXR with cardiomegaly and pulmonary edema., started on IV lasix, IV hydralazine, and IV labetalol. ECHO pending.   Current HF Medications: Diuretic: furosemide 40 mg IV BID Beta Blocker: carvedilol 50 mg BID *also on amlodipine 10 mg daily  Prior to admission HF Medications: Diuretic: torsemide 40 mg daily Beta blocker: carvedilol 25 mg BID *also on amlodipine 10 mg daily  Pertinent Lab Values: Serum creatinine 4.91, BUN 48, Potassium 2.9, Sodium 135,  BNP 740.9, A1c 6.0   Vital Signs: Weight: 198 lbs (admission weight: 200 lbs) Blood pressure: 170/100s  Heart rate: 70s  I/O: -3.1L yesterday; net -3.9L  Medication Assistance / Insurance Benefits Check: Does the patient have prescription insurance?  Yes Type of insurance plan: [place] [employer]  Outpatient Pharmacy:  Prior to admission outpatient pharmacy: [facility]  medical center Is the patient willing to use [facility] TOC pharmacy at discharge? Yes    Assessment: 1. Acute on chronic systolic CHF (LVEF 50-55%, repeat pending). NYHA class III symptoms. - Continue furosemide 40 mg IV BID. Strict I/Os and daily weights. Keep K>4 and Mg>2. Check magnesium in AM.  - Continue carvedilol 25 mg BID - Consider restarting BiDil 1 tab TID. He states that he had previous issue with headaches on hydralazine. He is willing to retry this.  - Continue amlodipine 10 mg daily  Plan: 1) Medication changes recommended at this time: - Add BiDil 1 tab TID   2) Patient assistance: - Has Medicaid  3)  Education  - Patient has been educated on current HF medications and potential additions to HF medication regimen - Patient verbalizes understanding that over the next few months, these medication doses may change and more medications may be added to optimize HF regimen - Patient has been educated on basic disease state pathophysiology and goals of therapy   Sharen Hones, PharmD, BCPS Heart Failure Stewardship Pharmacist Phone [phone]

## 2023-02-14 NOTE — Progress Notes (Signed)
Heart Failure Navigator Progress Note  Following this hospitalization to assess for HV TOC readiness.   EF 50-55% Patient going to Vascular, will plan to interview after patient returns.    Rhae Hammock, BSN, Scientist, clinical (histocompatibility and immunogenetics) Only

## 2023-02-14 NOTE — Consult Note (Signed)
   [facility] CM Inpatient Consult   02/14/2023  Nikalas Bramel [birth year] [id number]  [facility] [[facility]]  Accountable Care Organization [ACO] Patient: George Day   Primary Care Provider:  Caesar Bookman, NP, [facility]    Patient screened for hospitalization with noted high risk score for unplanned readmission risk and to assess for potential [facility]  [[facility]] Care Management service needs for post hospital transition for care coordination.  Review of patient's electronic medical record reveals patient is post procedure. 1:40 pm  Met with patient and spouse, to introduce them to [facility] Coordination follow up and support.  Reviewed SDOH and no needs currently.  Patient states he had issues getting his diuretic refilled [medication issues] and that PCP was asked to refill.  Explored ways patient to follow up on medications and not run out of medications. Patient verbalized understanding.  Plan:  Continue to follow progress and disposition to assess for post hospital community care coordination/management needs.  Referral request for community care coordination: following and plan to refer for community care coordination for HD follow up on needs.  Of note, [facility] Care Management/Population Health does not replace or interfere with any arrangements made by the Inpatient Transition of Care team.  For questions contact:   Charlesetta Shanks, RN BSN CCM [facility]  [phone] business mobile phone Toll free office [phone]  *Concierge Line  [phone] Fax number: [phone] [email or web address]

## 2023-02-14 NOTE — Progress Notes (Signed)
  Echocardiogram 2D Echocardiogram has been performed.  George Day 02/14/2023, 12:08 PM

## 2023-02-14 NOTE — Hospital Course (Addendum)
Mr. Kleinke was admitted to the hospital with the working diagnosis of volume overload.   47 yo male with the past medical history of hypertension, coronary artery disease, heart failure, COPD, pulmonary embolism and CKD stage IV who presented with chest pain, non exertional and radiated to left arm. Positive lower extremity edema. He has been off torsemide for the last 10 days. Because of severe chest pain, he called EMS. In the ED his blood pressure was 200/111, 182/104, HR 79, RR 19 and 02 saturation 98%, lungs with no wheezing or rales, heart with S1 and S2 present and rhythmic, abdomen with no distention and trace lower extremity edema.   Na 132, K 3,2 Cl 106 bicarbonate 21, glucose 123, bun 53 cr 4,79 BNP 740 High sensitive troponin 31 and 31  Wbc 6,0 hgb 8,8 plt 190   Chest radiograph with cardiomegaly with bilateral hilar vascular congestion, cephalization of the vasculature and bilateral symmetric central interstitial infiltrates.  EKG 82 bpm, normal axis, normal intervals, sinus rhythm with no significant ST segment or T wave changes. Positive LVH.   Patient was placed on furosemide IV with improvement in volume status.  Plan to continue torsemide and have close follow up with Nephrology.

## 2023-02-14 NOTE — Progress Notes (Signed)
Heart Failure Nurse Navigator Progress Note  PCP: Ngetich, Donalee Citrin, NP PCP-Cardiologist: Allyson Sabal Admission Diagnosis: Acute on chronic diastolic CHF.  Admitted from: Home via EMS  Presentation:   George Day presented with chest pressure, shortness of breath, headache, nausea and blurred vision. BP with EMS 208/122, HR 80, BNP 740, Creat 4.91. Patient reported to being out of his torsemide for 1 week. CXR with cardiomegaly with pulmonary edema and small pleural effusions consistent with CHF. IV lasix given,   Patient and his wife re-educated on the sign and symptoms of heart failure, daily weights, when to call his doctor or go to the  ED, Diet/ fluid restrictions, taking all medications as prescribed and attending all medical appointments. They verbalized their understanding, a HF TOC follow up appointment was scheduled for 02/27/2023 @ 1:30 pm.   ECHO/ LVEF: 50-55%  Clinical Course:  Past Medical History:  Diagnosis Date   Anemia 1998   Anginal pain (HCC)    Anxiety    Asthma    Coronary artery disease    Diabetes mellitus without complication (HCC) 1995   Hypertension 2019   MI (myocardial infarction) (HCC) 2010   Sleep apnea    Stroke [facility]) 2010     Social History   Socioeconomic History   Marital status: Married    Spouse name: Not on file   Number of children: 4   Years of education: Not on file   Highest education level: 10th grade  Occupational History   Occupation: Hydrologist  Tobacco Use   Smoking status: Former    Packs/day: 0.50    Years: 31.00    Additional pack years: 0.00    Total pack years: 15.50    Types: Cigars, E-cigarettes, Cigarettes    Quit date: 09/2021    Years since quitting: 1.4   Smokeless tobacco: Never  Vaping Use   Vaping Use: Never used  Substance and Sexual Activity   Alcohol use: Not Currently    Comment: occasioanlly    Drug use: Not Currently    Types: Marijuana   Sexual activity: Yes    Birth  control/protection: None  Other Topics Concern   Not on file  Social History Narrative   Not on file   Social Determinants of Health   Financial Resource Strain: Medium Risk (09/20/2022)   Overall Financial Resource Strain (CARDIA)    Difficulty of Paying Living Expenses: Somewhat hard  Food Insecurity: No Food Insecurity (02/13/2023)   Hunger Vital Sign    Worried About Running Out of Food in the Last Year: Never true    Ran Out of Food in the Last Year: Never true  Transportation Needs: No Transportation Needs (02/13/2023)   PRAPARE - Administrator, Civil Service (Medical): No    Lack of Transportation (Non-Medical): No  Physical Activity: Not on file  Stress: Not on file  Social Connections: Not on file   Education Assessment and Provision:  Detailed education and instructions provided on heart failure disease management including the following:  Signs and symptoms of Heart Failure When to call the physician Importance of daily weights Low sodium diet Fluid restriction Medication management Anticipated future follow-up appointments  Patient education given on each of the above topics.  Patient acknowledges understanding via teach back method and acceptance of all instructions.  Education Materials:  "Living Better With Heart Failure" Booklet, HF zone tool, & Daily Weight Tracker Tool.  Patient has scale at home: yes Patient has pill box  at home: yes    High Risk Criteria for Readmission and/or Poor Patient Outcomes: Heart failure hospital admissions (last 6 months): 1  No Show rate: 19% Difficult social situation: Lives with wife Demonstrates medication adherence: yes Primary Language: english Literacy level: reading, writing, and comprehension  Barriers of Care:   Continued HF education Diet/ fluid restrictions Daily weights  Considerations/Referrals:   Referral made to Heart Failure Pharmacist Stewardship: Yes Referral made to Heart Failure  CSW/NCM TOC: No Referral made to Heart & Vascular TOC clinic: Yes, Follow up 02/27/2023 @ 1:30 pm.   Items for Follow-up on DC/TOC: Continued HF education Diet/ fluid restrictions Daily weights   Rhae Hammock, BSN, RN Heart Failure Teacher, adult education Only

## 2023-02-14 NOTE — Assessment & Plan Note (Addendum)
Blood pressure has improved with current medical therapy with carvedilol, isosorbide, amlodipine and Bidil.

## 2023-02-14 NOTE — Assessment & Plan Note (Addendum)
CKD stage V. Hypokalemia.  Patient was placed on IV furosemide, negative fluid balance was achieved, -6,015 ml, with significant improvement in his symptoms.   At the time of his discharge his serum cr is 5,14 with K at 3.1, bicarbonate 22.  BUN is 52 and Na 135.  Patient serum cr baseline 4,8 to 5,3. I have discussed with him progressive nature of his renal failure and importance of close follow up as outpatient, as well as compliance with medical therapy. I have talked to nephrology and he will get a post hospitalization appointment.  He is getting close to needing renal replacement therapy.    Acute hypoxemic respiratory failure due to volume overload, acute pulmonary edema.  Oxygenation has improved with diuresis, today is 100 % on room air.   Resume oral torsemide 40 mg po daily. Considering progressive renal failure, will decrease K supplementation to 20 meq.

## 2023-02-14 NOTE — Progress Notes (Signed)
Progress Note   Patient: George Day YQM:[id number] DOB: 04/22/1976 DOA: 02/12/2023     1 DOS: the patient was seen and examined on 02/14/2023   Brief hospital course: George Day was admitted to the hospital with the working diagnosis of volume overload.   47 yo male with the past medical history of hypertension, coronary artery disease, heart failure, COPD, pulmonary embolism and CKD stage IV who presented with chest pain, non exertional and radiated to left arm. Positive lower extremity edema. He has been off torsemide for the last 10 days. Because of severe chest pain, he called EMS. In the ID his blood pressure was 200/111, 182/104, HR 79, RR 19 and 02 saturation 98%, lungs with no wheezing or rales, heart with S1 and S2 present and rhythmic, abdomen with no distention and trace lower extremity edema.   Na 132, K 3,2 Cl 106 bicarbonate 21, glucose 123, bun 53 cr 4,79 BNP 740 High sensitive troponin 31 and 31  Wbc 6,0 hgb 8,8 plt 190   Chest radiograph with cardiomegaly with bilateral hilar vascular congestion, cephalization of the vasculature and bilateral symmetric central interstitial infiltrates.  EKG 82 bpm, normal axis, normal intervals, sinus rhythm with no significant ST segment or T wave changes. Positive LVH.   Assessment and Plan: * Acute kidney injury superimposed on chronic kidney disease (HCC) CKD stage V. Hypokalemia. Urine output 2,675 ml  Renal function with serum cr at 4,91 with K at 2,9 and serum bicarbonate at 22.  Na 135 P 4,8   Patient has received IV furosemide this am and 40 meq Kcl po.  Plan to check renal function this pm and correct electrolytes if needed. Add phospate binders Resume torsemide tomorrow.   Acute hypoxemic respiratory failure due to volume overload, acute pulmonary edema.  Oxygenation has improved with diuresis, today is 98% on room air.  Continue oxymetry monitoring.   Acute on chronic diastolic CHF (congestive heart failure)  (HCC) Echocardiogram with preserved LV systolic function with EF 50 to 55%, severe concentric LVH, RV systolic function is preserved. LA with moderate dilatation. Large pericardial effusion, circumferential. No significant valvular disease.   Blood pressure continue to be elevated 205 over 122.   Plan to continue blood pressure control with amlodipine, carvedilol and will add Bidil.  Possible uremic pericardial effusion, will need close follow up as outpatient.  Encourage compliance with diuretic therapy.   Hypertensive urgency Blood pressure continue to be elevated.  Continue with amlodipine, carvedilol and bidil.   Microcytic anemia Follow up cell count.   COPD (chronic obstructive pulmonary disease) (HCC) No signs of acute exacerbation.       Subjective: Patient is feeling better, no chest pain, edema and dyspnea have improved.   Physical Exam: Vitals:   02/14/23 0411 02/14/23 0535 02/14/23 0640 02/14/23 0726  BP:  (!) 183/107 (!) 176/102 (!) 175/107  Pulse: 72   86  Resp: 17   17  Temp: 98.3 F (36.8 C)   98.4 F (36.9 C)  TempSrc: Oral   Oral  SpO2: 99%   98%  Weight: 89.8 kg     Height:       Neurology awake and alert ENT with mild pallor Cardiovascular with S1 and S2 present and rhythmic, positive S4 gallop, no rubs or murmurs No JVD No lower extremity edema Respiratory with no rales or wheezing, no rhonchi Abdomen with no distention  Data Reviewed:    Family Communication: no family at the bedside   Disposition: Status  is: Inpatient Remains inpatient appropriate because: renal failure with volume overload.   Planned Discharge Destination: Home     Author: Coralie Keens, MD 02/14/2023 3:15 PM  For on call review [email or web address].

## 2023-02-14 NOTE — Assessment & Plan Note (Addendum)
Echocardiogram with preserved LV systolic function with EF 50 to 55%, severe concentric LVH, RV systolic function is preserved. LA with moderate dilatation. Large pericardial effusion, circumferential. No significant valvular disease.   For uncontrolled hypertension he was placed on Bidil with good toleration.  Limited pharmacologic options due to reduced GFR.  Continue with amlodipine, and carvedilol.  Possible uremic pericardial effusion, will need close follow up as outpatient.  Encourage compliance with diuretic therapy.   I called cardiology about pericardial effusion, seems to be chronic, no hemodynamic compromise at this point. Plan to continue diuresis with torsemide and follow up echocardiogram as outpatient.

## 2023-02-15 ENCOUNTER — Other Ambulatory Visit (HOSPITAL_COMMUNITY): Payer: Self-pay

## 2023-02-15 DIAGNOSIS — N179 Acute kidney failure, unspecified: Secondary | ICD-10-CM | POA: Diagnosis not present

## 2023-02-15 DIAGNOSIS — I5033 Acute on chronic diastolic (congestive) heart failure: Secondary | ICD-10-CM | POA: Diagnosis not present

## 2023-02-15 DIAGNOSIS — I160 Hypertensive urgency: Secondary | ICD-10-CM | POA: Diagnosis not present

## 2023-02-15 DIAGNOSIS — D509 Iron deficiency anemia, unspecified: Secondary | ICD-10-CM | POA: Diagnosis not present

## 2023-02-15 LAB — RENAL FUNCTION PANEL
Albumin: 2.5 g/dL — ABNORMAL LOW (ref 3.5–5.0)
Anion gap: 9 (ref 5–15)
BUN: 52 mg/dL — ABNORMAL HIGH (ref 6–20)
CO2: 22 mmol/L (ref 22–32)
Calcium: 8.2 mg/dL — ABNORMAL LOW (ref 8.9–10.3)
Chloride: 104 mmol/L (ref 98–111)
Creatinine, Ser: 5.15 mg/dL — ABNORMAL HIGH (ref 0.61–1.24)
GFR, Estimated: 13 mL/min — ABNORMAL LOW (ref >60)
Glucose, Bld: 97 mg/dL (ref 70–99)
Phosphorus: 4.4 mg/dL (ref 2.5–4.6)
Potassium: 3.1 mmol/L — ABNORMAL LOW (ref 3.5–5.1)
Sodium: 135 mmol/L (ref 135–145)

## 2023-02-15 MED ORDER — POTASSIUM CHLORIDE CRYS ER 20 MEQ PO TBCR
40.0000 meq | EXTENDED_RELEASE_TABLET | Freq: Once | ORAL | Status: AC
  Administered 2023-02-15: 40 meq via ORAL
  Filled 2023-02-15: qty 2

## 2023-02-15 MED ORDER — ISOSORB DINITRATE-HYDRALAZINE 20-37.5 MG PO TABS
1.0000 | ORAL_TABLET | Freq: Three times a day (TID) | ORAL | 0 refills | Status: AC
  Filled 2023-02-15: qty 90, 30d supply, fill #0

## 2023-02-15 MED ORDER — TORSEMIDE 20 MG PO TABS
40.0000 mg | ORAL_TABLET | Freq: Every day | ORAL | 0 refills | Status: DC
Start: 2023-02-15 — End: 2023-04-15
  Filled 2023-02-15: qty 60, 30d supply, fill #0

## 2023-02-15 MED ORDER — POTASSIUM CHLORIDE CRYS ER 20 MEQ PO TBCR
20.0000 meq | EXTENDED_RELEASE_TABLET | Freq: Every day | ORAL | 2 refills | Status: DC
Start: 2023-02-15 — End: 2023-04-10

## 2023-02-15 NOTE — TOC Transition Note (Signed)
Transition of Care [facility]) - CM/SW Discharge Note   Patient Details  Name: George Day MRN: [id number] Date of Birth: Apr 28, 1976  Transition of Care [facility]) CM/SW Contact:  Leone Haven, RN Phone Number: 02/15/2023, 1:25 PM   Clinical Narrative:    For dc today, wife to transport home. Has no needs.   Final next level of care: Home/Self Care Barriers to Discharge: No Barriers Identified   Patient Goals and CMS Choice   Choice offered to / list presented to : NA  Discharge Placement                         Discharge Plan and Services Additional resources added to the After Visit Summary for   In-house Referral: NA Discharge Planning Services: CM Consult Post Acute Care Choice: NA          DME Arranged: N/A DME Agency: NA       HH Arranged: NA          Social Determinants of Health (SDOH) Interventions SDOH Screenings   Food Insecurity: No Food Insecurity (02/13/2023)  Housing: Low Risk  (02/13/2023)  Transportation Needs: No Transportation Needs (02/13/2023)  Utilities: Not At Risk (02/13/2023)  Alcohol Screen: Low Risk  (09/20/2022)  Depression (PHQ2-9): Low Risk  (11/21/2022)  Financial Resource Strain: Medium Risk (09/20/2022)  Tobacco Use: Medium Risk (02/14/2023)     Readmission Risk Interventions     No data to display

## 2023-02-15 NOTE — Plan of Care (Signed)
  Problem: Education: Goal: Knowledge of General Education information will improve Description: Including pain rating scale, medication(s)/side effects and non-pharmacologic comfort measures 02/15/2023 1054 by Irish Lack, RN Outcome: Progressing 02/15/2023 1053 by Irish Lack, RN Outcome: Progressing   Problem: Health Behavior/Discharge Planning: Goal: Ability to manage health-related needs will improve Outcome: Progressing   Problem: Clinical Measurements: Goal: Ability to maintain clinical measurements within normal limits will improve Outcome: Progressing Goal: Will remain free from infection Outcome: Progressing Goal: Diagnostic test results will improve Outcome: Progressing Goal: Respiratory complications will improve Outcome: Progressing Goal: Cardiovascular complication will be avoided Outcome: Progressing   Problem: Activity: Goal: Risk for activity intolerance will decrease Outcome: Progressing   Problem: Nutrition: Goal: Adequate nutrition will be maintained Outcome: Progressing   Problem: Coping: Goal: Level of anxiety will decrease Outcome: Progressing   Problem: Elimination: Goal: Will not experience complications related to bowel motility Outcome: Progressing Goal: Will not experience complications related to urinary retention Outcome: Progressing   Problem: Pain Managment: Goal: General experience of comfort will improve Outcome: Progressing   Problem: Safety: Goal: Ability to remain free from injury will improve Outcome: Progressing   Problem: Skin Integrity: Goal: Risk for impaired skin integrity will decrease Outcome: Progressing

## 2023-02-15 NOTE — Discharge Summary (Signed)
Physician Discharge Summary   Patient: George Day MRN: [id number] DOB: [birth year]  Admit date:     02/12/2023  Discharge date: 02/15/23  Discharge Physician: York Ram Taniyah Ballow   PCP: Caesar Bookman, NP   Recommendations at discharge:    Resumed torsemide 40 mg po daily, decreased K supplementation to 20 meq daily.  Follow up renal function and electrolytes in 7 days, he has an appointment with Dr Allyson Sabal next week.  Follow up with Primary Care and Nephrology as outpatient.  Added Bidil for better blood pressure control. Follow up echocardiogram for pericardial effusion as outpatient, he has an appointment with Dr Allyson Sabal next Tuesday.   Discharge Diagnoses: Principal Problem:   Acute kidney injury superimposed on chronic kidney disease (HCC) Active Problems:   Acute on chronic diastolic CHF (congestive heart failure) (HCC)   Hypertensive urgency   Microcytic anemia   COPD (chronic obstructive pulmonary disease) (HCC)  Resolved Problems:   * No resolved hospital problems. [facility] Course: George Day was admitted to the hospital with the working diagnosis of volume overload.   47 yo male with the past medical history of hypertension, coronary artery disease, heart failure, COPD, pulmonary embolism and CKD stage IV who presented with chest pain, non exertional and radiated to left arm. Positive lower extremity edema. He has been off torsemide for the last 10 days. Because of severe chest pain, he called EMS. In the ED his blood pressure was 200/111, 182/104, HR 79, RR 19 and 02 saturation 98%, lungs with no wheezing or rales, heart with S1 and S2 present and rhythmic, abdomen with no distention and trace lower extremity edema.   Na 132, K 3,2 Cl 106 bicarbonate 21, glucose 123, bun 53 cr 4,79 BNP 740 High sensitive troponin 31 and 31  Wbc 6,0 hgb 8,8 plt 190   Chest radiograph with cardiomegaly with bilateral hilar vascular congestion, cephalization of the vasculature  and bilateral symmetric central interstitial infiltrates.  EKG 82 bpm, normal axis, normal intervals, sinus rhythm with no significant ST segment or T wave changes. Positive LVH.   Patient was placed on furosemide IV with improvement in volume status.  Plan to continue torsemide and have close follow up with Nephrology.   Assessment and Plan: * Acute kidney injury superimposed on chronic kidney disease (HCC) CKD stage V. Hypokalemia.  Patient was placed on IV furosemide, negative fluid balance was achieved, -6,015 ml, with significant improvement in his symptoms.   At the time of his discharge his serum cr is 5,14 with K at 3.1, bicarbonate 22.  BUN is 52 and Na 135.  Patient serum cr baseline 4,8 to 5,3. I have discussed with him progressive nature of his renal failure and importance of close follow up as outpatient, as well as compliance with medical therapy. I have talked to nephrology and he will get a post hospitalization appointment.  He is getting close to needing renal replacement therapy.    Acute hypoxemic respiratory failure due to volume overload, acute pulmonary edema.  Oxygenation has improved with diuresis, today is 100 % on room air.   Resume oral torsemide 40 mg po daily. Considering progressive renal failure, will decrease K supplementation to 20 meq.   Acute on chronic diastolic CHF (congestive heart failure) (HCC) Echocardiogram with preserved LV systolic function with EF 50 to 55%, severe concentric LVH, RV systolic function is preserved. LA with moderate dilatation. Large pericardial effusion, circumferential. No significant valvular disease.   For uncontrolled hypertension he  was placed on Bidil with good toleration.  Limited pharmacologic options due to reduced GFR.  Continue with amlodipine, and carvedilol.  Possible uremic pericardial effusion, will need close follow up as outpatient.  Encourage compliance with diuretic therapy.   I called cardiology  about pericardial effusion, seems to be chronic, no hemodynamic compromise at this point. Plan to continue diuresis with torsemide and follow up echocardiogram as outpatient.   Hypertensive urgency Blood pressure has improved with current medical therapy with carvedilol, isosorbide, amlodipine and Bidil.   Microcytic anemia Follow up cell count.   COPD (chronic obstructive pulmonary disease) (HCC) No signs of acute exacerbation.          Consultants: none  Procedures performed: none   Disposition: Home Diet recommendation:  Cardiac diet DISCHARGE MEDICATION: Allergies as of 02/15/2023       Reactions   Honey Bee Venom [bee Venom] Anaphylaxis   Penicillins Anaphylaxis   ALL   Tomato Swelling        Medication List     STOP taking these medications    docusate sodium 100 MG capsule Commonly known as: COLACE   Ventolin HFA 108 (90 Base) MCG/ACT inhaler Generic drug: albuterol       TAKE these medications    amLODipine 10 MG tablet Commonly known as: NORVASC Take 1 tablet (10 mg total) by mouth daily.   aspirin EC 81 MG tablet Take 1 tablet (81 mg total) by mouth daily.   atorvastatin 40 MG tablet Commonly known as: LIPITOR Take 1 tablet (40 mg total) by mouth daily.   carvedilol 25 MG tablet Commonly known as: COREG Take 2 tablets (50 mg total) by mouth 2 (two) times daily.   famotidine 20 MG tablet Commonly known as: PEPCID Take 1 tablet (20 mg total) by mouth daily.   isosorbide-hydrALAZINE 20-37.5 MG tablet Commonly known as: BIDIL Take 1 tablet by mouth 3 (three) times daily.   nitroGLYCERIN 0.6 MG SL tablet Commonly known as: NITROSTAT Place 1 tablet (0.6 mg total) under the tongue every 5 (five) minutes as needed for chest pain.   potassium chloride SA 20 MEQ tablet Commonly known as: KLOR-CON M Take 1 tablet (20 mEq total) by mouth daily. What changed: how much to take   torsemide 20 MG tablet Commonly known as: DEMADEX Take 2  tablets (40 mg total) by mouth daily.   Trelegy Ellipta 100-62.5-25 MCG/ACT Aepb Generic drug: Fluticasone-Umeclidin-Vilant Inhale 1 puff into the lungs daily.        Follow-up Information     [facility]. Go in 15 day(s).   Specialty: Cardiology Why: Hospital follow up 03/04/2023 @ 1:30 pm PLEaSE bring a current medication list to appointment FREE valet parking, Entrance C, off [employer] information: [address] [id number] mc [place] [address] [phone]        [facility]. Go on 03/01/2023.   Why: @8 :45am Contact information: [address] [place] [address] [phone]               Discharge Exam: Filed Weights   02/13/23 1600 02/14/23 0411 02/15/23 0409  Weight: 91 kg 89.8 kg 89.3 kg   BP (!) 153/90 (BP Location: Left Arm)   Pulse 80   Temp 98.2 F (36.8 C) (Oral)   Resp 20   Ht 5\' 9"  (1.753 m)   Wt 89.3 kg   SpO2 100%   BMI 29.08 kg/m  Patient is feeling better, no nausea or vomiting, no dyspnea, edema, PND or orthopnea.   Neurology awake and alert ENT with mild pallor Cardiovascular with S1 and S2 present and rhythmic  Respiratory with no rales or wheezing, no rhonchi Abdomen with no distention  No lower extremity edema   Condition at discharge: stable  The results of significant diagnostics from this hospitalization (including imaging, microbiology, ancillary and laboratory) are listed below for reference.   Imaging Studies: ECHOCARDIOGRAM COMPLETE  Result Date: 02/14/2023    ECHOCARDIOGRAM REPORT   Patient Name:   RYEL FIORAVANTI Date of Exam: 02/14/2023 Medical Rec #:  [id number]      Height:       69.0 in Accession #:    [id number]     Weight:       198.0 lb Date of Birth:  24-Mar-1976       BSA:          2.057 m Patient Age:    47 years       BP:           175/107 mmHg Patient Gender: M              HR:            69 bpm. Exam Location:  Inpatient Procedure: 2D Echo, Cardiac Doppler and Color Doppler Indications:    CHF-Acute Diastolic I50.31  History:        Patient has prior history of Echocardiogram examinations, most                 recent 09/18/2022. CHF, CAD, COPD, Signs/Symptoms:Chest Pain;                 Risk Factors:Hypertension, Diabetes, Dyslipidemia and Former                 Smoker. CKD, stage 4.  Sonographer:    Lucendia Herrlich Referring Phys: [id number] RONDELL A SMITH IMPRESSIONS  1. Left ventricular ejection fraction, by estimation, is 50 to 55%. The left ventricle has low normal function. The left ventricle has no regional wall motion abnormalities. There is severe concentric left ventricular hypertrophy. Left ventricular diastolic parameters are consistent with Grade III diastolic dysfunction (restrictive).  2. Right ventricular systolic function is normal. The right ventricular size is normal. Tricuspid regurgitation signal is inadequate for assessing PA pressure.  3. Left atrial size was moderately dilated.  4. A large pericardial effusion is present and measures largest around the RA best visualized on subcostal views. The pericardial effusion is circumferential. There is no evidence of cardiac tamponade.  5. The mitral valve is grossly normal. Mild mitral valve regurgitation.  6. The aortic valve is tricuspid. Aortic valve regurgitation is not visualized. Aortic valve sclerosis is present, with no evidence of aortic valve stenosis.  7. The inferior vena cava is dilated in size with <50% respiratory variability, suggesting right atrial pressure of 15 mmHg. Comparison(s): Compared to prior TTE in 08/2022, the pericardial effusion is slightly larger around the RA but no evidence of tamponade physiology. FINDINGS  Left Ventricle: Left ventricular ejection fraction, by estimation, is 50 to 55%. The left ventricle has low normal function. The left ventricle has no regional wall motion abnormalities. The left  ventricular internal cavity size was normal in size. There is severe concentric left ventricular hypertrophy. Left ventricular diastolic parameters are consistent with Grade III diastolic dysfunction (restrictive). Right Ventricle: The right ventricular size is normal. No increase in right ventricular wall thickness. Right  ventricular systolic function is normal. Tricuspid regurgitation signal is inadequate for assessing PA pressure. Left Atrium: Left atrial size was moderately dilated. Right Atrium: Right atrial size was normal in size. Pericardium: A large pericardial effusion is present and measures largest around the RA best visualized on subcostal views. The pericardial effusion is circumferential. There is no evidence of cardiac tamponade. Mitral Valve: The mitral valve is grossly normal. There is mild thickening of the mitral valve leaflet(s). Mild mitral valve regurgitation. Tricuspid Valve: The tricuspid valve is normal in structure. Tricuspid valve regurgitation is trivial. Aortic Valve: The aortic valve is tricuspid. Aortic valve regurgitation is not visualized. Aortic valve sclerosis is present, with no evidence of aortic valve stenosis. Aortic valve peak gradient measures 10.2 mmHg. Pulmonic Valve: The pulmonic valve was normal in structure. Pulmonic valve regurgitation is trivial. Aorta: The aortic root is normal in size and structure. Venous: The inferior vena cava is dilated in size with less than 50% respiratory variability, suggesting right atrial pressure of 15 mmHg. IAS/Shunts: The atrial septum is grossly normal.  LEFT VENTRICLE PLAX 2D LVIDd:         4.70 cm   Diastology LVIDs:         3.30 cm   LV e' medial:    7.77 cm/s LV PW:         1.50 cm   LV E/e' medial:  14.8 LV IVS:        1.30 cm   LV e' lateral:   7.80 cm/s LVOT diam:     2.00 cm   LV E/e' lateral: 14.7 LV SV:         74 LV SV Index:   36 LVOT Area:     3.14 cm  RIGHT VENTRICLE             IVC RV S prime:     16.40 cm/s  IVC diam:  2.30 cm TAPSE (M-mode): 2.2 cm LEFT ATRIUM              Index        RIGHT ATRIUM           Index LA diam:        4.70 cm  2.28 cm/m   RA Area:     17.10 cm LA Vol (A2C):   86.7 ml  42.14 ml/m  RA Volume:   41.30 ml  20.08 ml/m LA Vol (A4C):   94.0 ml  45.69 ml/m LA Biplane Vol: 100.0 ml 48.61 ml/m  AORTIC VALVE                 PULMONIC VALVE AV Area (Vmax): 2.61 cm     PR End Diast Vel: 9.12 msec AV Vmax:        160.00 cm/s AV Peak Grad:   10.2 mmHg LVOT Vmax:      133.00 cm/s LVOT Vmean:     86.367 cm/s LVOT VTI:       0.237 m  AORTA Ao Root diam: 3.00 cm Ao Asc diam:  3.20 cm MITRAL VALVE MV Area (PHT): 4.80 cm     SHUNTS MV Decel Time: 158 msec     Systemic VTI:  0.24 m MR Peak grad: 39.7 mmHg     Systemic Diam: 2.00 cm MR Vmax:      315.00 cm/s MV E velocity: 115.00 cm/s MV A velocity: 49.90 cm/s MV E/A ratio:  2.30 Laurance Flatten MD Electronically signed by Laurance Flatten MD Signature Date/Time: 02/14/2023/1:58:47  PM    Final    DG Chest Portable 1 View  Result Date: 02/12/2023 CLINICAL DATA:  Chest pain. EXAM: PORTABLE CHEST 1 VIEW COMPARISON:  Radiograph 09/17/2022, CT 11/01/2022 FINDINGS: The heart is enlarged, progressive. Interstitial and septal thickening typical of pulmonary edema. Small bilateral pleural effusions suspected. No evidence of confluent consolidation. No pneumothorax. On limited assessment, no acute osseous findings. IMPRESSION: Cardiomegaly with pulmonary edema and small pleural effusions consistent with CHF. Electronically Signed   By: Narda Rutherford M.D.   On: 02/12/2023 23:38    Microbiology: Results for orders placed or performed during the hospital encounter of 02/19/21  Resp Panel by RT-PCR (Flu A&B, Covid) Nasopharyngeal Swab     Status: None   Collection Time: 02/19/21  4:36 AM   Specimen: Nasopharyngeal Swab; Nasopharyngeal(NP) swabs in vial transport medium  Result Value Ref Range Status   SARS Coronavirus 2 by RT PCR NEGATIVE NEGATIVE Final     Comment: (NOTE) SARS-CoV-2 target nucleic acids are NOT DETECTED.  The SARS-CoV-2 RNA is generally detectable in upper respiratory specimens during the acute phase of infection. The lowest concentration of SARS-CoV-2 viral copies this assay can detect is 138 copies/mL. A negative result does not preclude SARS-Cov-2 infection and should not be used as the sole basis for treatment or other patient management decisions. A negative result may occur with  improper specimen collection/handling, submission of specimen other than nasopharyngeal swab, presence of viral mutation(s) within the areas targeted by this assay, and inadequate number of viral copies(<138 copies/mL). A negative result must be combined with clinical observations, patient history, and epidemiological information. The expected result is Negative.  Fact Sheet for Patients:  BloggerCourse.com  Fact Sheet for Healthcare Providers:  SeriousBroker.it  This test is no t yet approved or cleared by the [place] FDA and  has been authorized for detection and/or diagnosis of SARS-CoV-2 by FDA under an Emergency Use Authorization (EUA). This EUA will remain  in effect (meaning this test can be used) for the duration of the COVID-19 declaration under Section 564(b)(1) of the Act, 21 U.S.C.section 360bbb-3(b)(1), unless the authorization is terminated  or revoked sooner.       Influenza A by PCR NEGATIVE NEGATIVE Final   Influenza B by PCR NEGATIVE NEGATIVE Final    Comment: (NOTE) The Xpert Xpress SARS-CoV-2/FLU/RSV plus assay is intended as an aid in the diagnosis of influenza from Nasopharyngeal swab specimens and should not be used as a sole basis for treatment. Nasal washings and aspirates are unacceptable for Xpert Xpress SARS-CoV-2/FLU/RSV testing.  Fact Sheet for Patients: BloggerCourse.com  Fact Sheet for Healthcare  Providers: SeriousBroker.it  This test is not yet approved or cleared by the [place] FDA and has been authorized for detection and/or diagnosis of SARS-CoV-2 by FDA under an Emergency Use Authorization (EUA). This EUA will remain in effect (meaning this test can be used) for the duration of the COVID-19 declaration under Section 564(b)(1) of the Act, 21 U.S.C. section 360bbb-3(b)(1), unless the authorization is terminated or revoked.  Performed at [facility], [address] [place], [place] [address]   MRSA Next Gen by PCR, Nasal     Status: None   Collection Time: 02/19/21  8:00 PM   Specimen: Nasal Mucosa; Nasal Swab  Result Value Ref Range Status   MRSA by PCR Next Gen NOT DETECTED NOT DETECTED Final    Comment: (NOTE) The GeneXpert MRSA Assay (FDA approved for NASAL specimens only), is one component of  a comprehensive MRSA colonization surveillance program. It is not intended to diagnose MRSA infection nor to guide or monitor treatment for MRSA infections. Test performance is not FDA approved in patients less than 42 years old. Performed at [facility], [address] [place], [place] [address]     Labs: CBC: Recent Labs  Lab 02/12/23 2335 02/12/23 2346 02/14/23 0112  WBC 6.0  --  5.8  NEUTROABS 4.1  --   --   HGB 8.8* 11.2* 8.4*  HCT 27.3* 33.0* 26.0*  MCV 68.9*  --  69.0*  PLT 190  --  186   Basic Metabolic Panel: Recent Labs  Lab 02/12/23 2335 02/12/23 2346 02/13/23 1615 02/14/23 0112 02/14/23 1523 02/15/23 0054  NA 137 140 138 135 138 135  K 3.2* 3.2* 3.4* 2.9* 3.4* 3.1*  CL 106 107 106 105 103 104  CO2 21*  --  21* 22 23 22   GLUCOSE 123* 116* 97 136* 102* 97  BUN 53* 48* 48* 48* 47* 52*  CREATININE 4.79* 5.30* 4.80* 4.91* 5.19* 5.15*  CALCIUM 8.1*  --  8.3* 8.0* 8.4* 8.2*  PHOS  --   --  5.0* 4.8*  --  4.4   Liver Function Tests: Recent Labs  Lab 02/12/23 2335 02/13/23 1615 02/14/23 0112  02/15/23 0054  AST 28  --   --   --   ALT 32  --   --   --   ALKPHOS 100  --   --   --   BILITOT 1.1  --   --   --   PROT 6.2*  --   --   --   ALBUMIN 2.7* 3.0* 2.6* 2.5*   CBG: Recent Labs  Lab 02/12/23 2328  GLUCAP 119*    Discharge time spent: greater than 30 minutes.  Signed: Coralie Keens, MD [facility] 02/15/2023

## 2023-02-15 NOTE — Plan of Care (Signed)

## 2023-02-15 NOTE — Progress Notes (Addendum)
Heart Failure Stewardship Pharmacist Progress Note   PCP: Ngetich, Donalee Citrin, NP PCP-Cardiologist: Nanetta Batty, MD    HPI:  [name] with PMH of CAD, T2DM, CKD IV, adrenal mass, anemia, HTN, HLD, OSA on CPAP, asthma, MS and diastolic HF. Also has history of MI in 2010 with no stent, CVA 2010.   Admitted 12/21 with CP. HsTroponin flat not consistent with ACS. Echo showed EF 50 to 55%, no RWMA, grade 2 DD. Blood pressure was markedly elevated during this admission, borderline elevated troponin was felt to be related to high blood pressure. Renal artery duplex was normal. Started on BP medication and discharged home.   He had regular follow up with General Cardiology. Had chest pain and underwent Myoview (1/22) showed EF 42%, ST segment depression noted in lead II, III, aVF and V6, no evidence of ischemia or infarction on the Myoview perfusion study, intermediate risk study due to moderately reduced LVEF. Echo 12/21 showed normal EF, elected not to repeat echo.   Found to have R adrenal gland mass (DDx of old hematoma vs cystic neoplasm) in 2/22. He was referred by Endocrinology to Dr. Duanne Guess for surgical excision.   Admitted 7/22 with HA, blurred vision and HTN urgency. BP was 213/126. Required ICU care and nicardipine gtt. Neurology consulted and MRI showed true cervical spinal cord lesions suggestive of demyelinating disease. He was started on steroid therapy with improvement in neuro symptoms. Drips weaned and placed on oral BP meds. Nephrology consulted with SCr 2.93>>3.4. He was discharged with Neurology and Nephrology follow up.   Admitted 12/23 with GIB. Underwent colonoscopy showing non-bleeding duodenal ulcer and a rectal polyp. Started on PPI and discharged with GI follow up. Admitted 1/24 with a/c dHF, had been off of his meds. BP was 235/127 on admission. He was diuresed with IV lasix and home BP meds restarted. Echo showed EF 50-55%, severe concentric LVH, RV normal,  moderate pericardial effusion. Nephrology consulted as his SCr 4.10 on admission, no indication for HD catheter placement. He was discharged home, weight 196 lbs.    He was seen in [facility] last 2/22. Volume overloaded. Torsemide was increased to 80 mg daily and started on amlodipine. He was not able to do that because he lost housing.   Seen back in [facility] 3/13. Unable to weigh. Taking all medications but did not pick up amlodipine. He plugs in CPAP when he can. He is homeless with his wife and 5 children living in  his car. He gets food stamps and his children's school helps with gas cards. Started back on amlodipine. Unfortunately, was a no show to follow up TOC appt. Underwent cardiac MRI on 5/19, LVEF 49%. Borderline elevated extracellular volume percentage, suspect increased myocardial fibrosis in the setting of hypertension.   Presented to the ED on 6/25 with chest pain, shortness of breath, LE edmea, blurred vision, headache, and nausea. Reported being out of his torsemide for 1 week. He states that he requested a refill from his PCP 2 weeks before he ran out but the new prescription was never called into the pharmacy. States he is otherwise compliant with medications. In the ED, BP was 200/111, BNP 740, hstrop 31. CXR with cardiomegaly and pulmonary edema., started on IV lasix, IV hydralazine, and IV labetalol. ECHO with LVEF 50-55%, severe concentric LVH, no regional wall motion abnormalities, G3DD, RV normal, mild MR.    Current HF Medications: Beta Blocker: carvedilol 50 mg BID Other: BiDil 1 tab TID *also  on amlodipine 10 mg daily  Prior to admission HF Medications: Diuretic: torsemide 40 mg daily Beta blocker: carvedilol 25 mg BID *also on amlodipine 10 mg daily  Pertinent Lab Values: Serum creatinine 5.15, BUN 52, Potassium 3.1, Sodium 135, BNP 740.9, A1c 6.0   Vital Signs: Weight: 196 lbs (admission weight: 200 lbs) Blood pressure: 150/80s  Heart rate: 70s  I/O: -3.4L yesterday; net  -6L  Medication Assistance / Insurance Benefits Check: Does the patient have prescription insurance?  Yes Type of insurance plan:  Medicaid  Outpatient Pharmacy:  Prior to admission outpatient pharmacy: [facility] Is the patient willing to use [facility] TOC pharmacy at discharge? Yes    Assessment: 1. Acute on chronic systolic CHF (LVEF 50-55%). NYHA class II symptoms. - Off IV lasix. Creatinine bumped today, baseline ~4.8. Possible restart torsemide tomorrow. Strict I/Os and daily weights. Keep K>4 and Mg>2. KCl 40 mEq x 1 given for replacement. - Continue carvedilol 25 mg BID - Continue BiDil 1 tab TID. Reports he is tolerating this well without any headaches.  - Continue amlodipine 10 mg daily  Plan: 1) Medication changes recommended at this time: - Continue current regimen, restart torsemide tomorrow, anticipated dc today  2) Patient assistance: - Has Medicaid - PCP transitioned to [facility] clinic. He states he had issues with getting refills authorized by previous PCP.  3)  Education  - Patient has been educated on current HF medications and potential additions to HF medication regimen - Patient verbalizes understanding that over the next few months, these medication doses may change and more medications may be added to optimize HF regimen - Patient has been educated on basic disease state pathophysiology and goals of therapy   Sharen Hones, PharmD, BCPS Heart Failure Stewardship Pharmacist Phone [phone]

## 2023-02-15 NOTE — Progress Notes (Signed)
Awaiting for TOC meds. 

## 2023-02-15 NOTE — TOC Initial Note (Signed)
Transition of Care [facility]) - Initial/Assessment Note    Patient Details  Name: George Day MRN: [id number] Date of Birth: [birth year]  Transition of Care [facility]) CM/SW Contact:    Leone Haven, RN Phone Number: 02/15/2023, 1:24 PM  Clinical Narrative:                 From home with wife, indep, he has PCP , but in process of looking for another one per wife.  He has insurance on file.  He currently does not have any HH services in place. He has a cpap machine at home.  His wife is at the bedside and will transport him home. She is his support system as well.   Expected Discharge Plan: Home/Self Care Barriers to Discharge: No Barriers Identified   Patient Goals and CMS Choice Patient states their goals for this hospitalization and ongoing recovery are:: return home   Choice offered to / list presented to : NA      Expected Discharge Plan and Services In-house Referral: NA Discharge Planning Services: CM Consult Post Acute Care Choice: NA Living arrangements for the past 2 months: Single Family Home Expected Discharge Date: 02/15/23               DME Arranged: N/A DME Agency: NA       HH Arranged: NA          Prior Living Arrangements/Services Living arrangements for the past 2 months: Single Family Home Lives with:: Spouse Patient language and need for interpreter reviewed:: Yes Do you feel safe going back to the place where you live?: Yes      Need for Family Participation in Patient Care: Yes (Comment) Care giver support system in place?: Yes (comment)   Criminal Activity/Legal Involvement Pertinent to Current Situation/Hospitalization: No - Comment as needed  Activities of Daily Living Home Assistive Devices/Equipment: None ADL Screening (condition at time of admission) Patient's cognitive ability adequate to safely complete daily activities?: Yes Is the patient deaf or have difficulty hearing?: No Does the patient have difficulty seeing, even when  wearing glasses/contacts?: No Does the patient have difficulty concentrating, remembering, or making decisions?: No Patient able to express need for assistance with ADLs?: Yes Does the patient have difficulty dressing or bathing?: No Independently performs ADLs?: Yes (appropriate for developmental age) Does the patient have difficulty walking or climbing stairs?: No Weakness of Legs: None Weakness of Arms/Hands: None  Permission Sought/Granted                  Emotional Assessment Appearance:: Appears stated age     Orientation: : Oriented to Self, Oriented to Place, Oriented to  Time, Oriented to Situation Alcohol / Substance Use: Not Applicable Psych Involvement: No (comment)  Admission diagnosis:  Acute on chronic diastolic CHF (congestive heart failure) (HCC) [I50.33] Patient Active Problem List   Diagnosis Date Noted   Acute on chronic diastolic CHF (congestive heart failure) (HCC) 02/13/2023   Elevated troponin 02/13/2023   Acute respiratory distress 02/13/2023   COPD (chronic obstructive pulmonary disease) (HCC) 02/13/2023   Chronic diastolic heart failure (HCC) 10/12/2022   MS (multiple sclerosis) (HCC) 10/12/2022   Hypervolemia 09/17/2022   Anasarca 09/17/2022   Hematochezia 08/18/2022   Pericardial effusion 08/17/2022   Coronary artery disease 08/17/2022   Homelessness 08/16/2022   Hypertensive urgency 02/19/2021   Acute kidney injury superimposed on chronic kidney disease (HCC)    Hypokalemia    Accelerated hypertension 02/16/2021   Right adrenal mass (  HCC) 11/28/2020   Anemia 08/31/2020   Former smoker 08/29/2020   Hyperlipidemia associated with type 2 diabetes mellitus (HCC) 08/29/2020   Microcytic anemia 08/29/2020   Influenza vaccination declined 08/29/2020   23-polyvalent pneumococcal polysaccharide vaccine declined 08/29/2020   Chest pain 08/08/2020   GAD (generalized anxiety disorder) 02/25/2020   Hyperlipidemia 02/25/2020   CKD (chronic kidney  disease) stage 4, GFR 15-29 ml/min (HCC) 02/25/2020   Diabetes mellitus (HCC) 01/05/2020   Essential hypertension 01/05/2020   Tobacco dependence 01/05/2020   Coronary artery disease involving native coronary artery of native heart without angina pectoris 01/05/2020   History of CVA with residual deficit 01/05/2020   Moderate persistent asthma without complication 01/05/2020   Class 1 obesity due to excess calories with serious comorbidity and body mass index (BMI) of 32.0 to 32.9 in adult 01/05/2020   PCP:  Ngetich, Donalee Citrin, NP Pharmacy:   [facility] [address] [place] [address] Phone: [phone] Fax: [phone]  [facility] [address] [place] [address] Phone: [phone] Fax: [phone]  [facility] [address] [place] [address] Phone: [phone] Fax: [phone]  [id number] [place], [place] - [address] AT [facility] [address] [place] [address] Phone: [phone] Fax: [phone]     Social Determinants of Health (SDOH) Social History: SDOH Screenings   Food Insecurity: No Food Insecurity (02/13/2023)  Housing: Low Risk  (02/13/2023)  Transportation Needs: No Transportation Needs (02/13/2023)  Utilities: Not At Risk (02/13/2023)  Alcohol Screen: Low Risk  (09/20/2022)  Depression (PHQ2-9): Low Risk  (11/21/2022)  Financial Resource Strain: Medium Risk (09/20/2022)  Tobacco Use: Medium Risk (02/14/2023)   SDOH Interventions:     Readmission Risk Interventions     No data to display

## 2023-02-18 ENCOUNTER — Telehealth: Payer: Self-pay | Admitting: *Deleted

## 2023-02-18 ENCOUNTER — Telehealth: Payer: Self-pay

## 2023-02-18 NOTE — Progress Notes (Signed)
  Care Coordination   Note   02/18/2023 Name: Sanjiv Petit MRN: [id number] DOB: 11/01/1975  Kinkade Jaco is a 47 y.o. year old male who sees Ngetich, Dinah C, NP for primary care. I reached out to [name] by phone today to offer care coordination services.  Mr. Consolo was given information about Care Coordination services today including:   The Care Coordination services include support from the care team which includes your Nurse Coordinator, Clinical Social Worker, or Pharmacist.  The Care Coordination team is here to help remove barriers to the health concerns and goals most important to you. Care Coordination services are voluntary, and the patient may decline or stop services at any time by request to their care team member.   Care Coordination Consent Status: Patient agreed to services and verbal consent obtained.   Follow up plan:  Telephone appointment with care coordination team member scheduled for:  02/27/2023  Encounter Outcome:  Pt. Scheduled from referral   Burman Nieves, [facility] Care Coordination Care Guide Direct Dial: [phone]

## 2023-02-18 NOTE — Transitions of Care (Post Inpatient/ED Visit) (Signed)
02/18/2023  Name: George Day MRN: [id number] DOB: [birth year]  Today's TOC FU Call Status: Today's TOC FU Call Status:: Successful TOC FU Call Competed TOC FU Call Complete Date: 02/18/23  Transition Care Management Follow-up Telephone Call Date of Discharge: 03/17/23 Discharge Facility: [facility]) Type of Discharge: Inpatient Admission Primary Inpatient Discharge Diagnosis:: Acute Kidney Injury Superimplsed on Chronic Kidney Failure How have you been since you were released from the hospital?: Better (Patient notes he is doing well.  Much better) Any questions or concerns?: No  Items Reviewed: Did you receive and understand the discharge instructions provided?: Yes Medications obtained,verified, and reconciled?: Yes (Medications Reviewed) Any new allergies since your discharge?: No Dietary orders reviewed?: Yes Type of Diet Ordered:: Low sodium Heart Healthy Do you have support at home?: Yes People in Home: spouse Name of Support/Comfort Primary Source: [name]  Medications Reviewed Today: Medications Reviewed Today     Reviewed by Jodelle Gross, RN (Case Manager) on 02/18/23 at 1121  Med List Status: <None>   Medication Order Taking? Sig Documenting Provider Last Dose Status Informant  amLODipine (NORVASC) 10 MG tablet [id number] Yes Take 1 tablet (10 mg total) by mouth daily. Ngetich, Donalee Citrin, NP Taking Active Self, Pharmacy Records  aspirin EC 81 MG tablet [id number] Yes Take 1 tablet (81 mg total) by mouth daily. Ngetich, Donalee Citrin, NP Taking Active Self, Pharmacy Records  atorvastatin (LIPITOR) 40 MG tablet [id number] Yes Take 1 tablet (40 mg total) by mouth daily. Ngetich, Donalee Citrin, NP Taking Active Self, Pharmacy Records  carvedilol (COREG) 25 MG tablet [id number] Yes Take 2 tablets (50 mg total) by mouth 2 (two) times daily. Ngetich, Donalee Citrin, NP Taking Active Self, Pharmacy Records  famotidine (PEPCID) 20 MG tablet [id number] Yes Take 1 tablet (20 mg total) by mouth  daily. Ngetich, Donalee Citrin, NP Taking Active Self, Pharmacy Records  Fluticasone-Umeclidin-Vilant [facility] ELLIPTA) 100-62.5-25 MCG/ACT AEPB [id number] Yes Inhale 1 puff into the lungs daily. Ngetich, Donalee Citrin, NP Taking Active Self, Pharmacy Records  isosorbide-hydrALAZINE (BIDIL) 20-37.5 MG tablet [id number] Yes Take 1 tablet by mouth 3 (three) times daily. Arrien, York Ram, MD Taking Active   nitroGLYCERIN (NITROSTAT) 0.6 MG SL tablet [id number] Yes Place 1 tablet (0.6 mg total) under the tongue every 5 (five) minutes as needed for chest pain. Ngetich, Donalee Citrin, NP Taking Active Self, Pharmacy Records  potassium chloride SA (KLOR-CON M) 20 MEQ tablet [id number] Yes Take 1 tablet (20 mEq total) by mouth daily. Arrien, York Ram, MD Taking Active   torsemide [facility]) 20 MG tablet [id number] Yes Take 2 tablets (40 mg total) by mouth daily. Arrien, York Ram, MD Taking Active             Home Care and Equipment/Supplies: Were Home Health Services Ordered?: No Any new equipment or medical supplies ordered?: No  Functional Questionnaire: Do you need assistance with bathing/showering or dressing?: No Do you need assistance with meal preparation?: No Do you need assistance with eating?: No Do you have difficulty maintaining continence: No Do you need assistance with getting out of bed/getting out of a chair/moving?: No  Follow up appointments reviewed: PCP Follow-up appointment confirmed?: Yes Date of PCP follow-up appointment?: 03/01/23 Follow-up Provider: Dr. Rosaura Carpenter Specialist [facility] Follow-up appointment confirmed?: Yes Date of Specialist follow-up appointment?: 02/19/23 Follow-Up Specialty Provider:: Dr. Allyson Sabal Do you need transportation to your follow-up appointment?: Yes Transportation Need Intervention Addressed By:: Other: (Discussed with patient to call his insurance provider to see if they have transportation in  his benefits.  Patient has a car but it does not  always work) Do you understand care options if your condition(s) worsen?: Yes-patient verbalized understanding  Jodelle Gross, RN, BSN, CCM Care Management Coordinator [place] [employer]

## 2023-02-19 ENCOUNTER — Encounter: Payer: Self-pay | Admitting: Cardiovascular Disease

## 2023-02-19 ENCOUNTER — Ambulatory Visit (INDEPENDENT_AMBULATORY_CARE_PROVIDER_SITE_OTHER): Payer: 59 | Admitting: Cardiovascular Disease

## 2023-02-19 ENCOUNTER — Telehealth: Payer: Self-pay | Admitting: Cardiovascular Disease

## 2023-02-19 VITALS — BP 128/78 | HR 56 | Ht 69.0 in | Wt 202.2 lb

## 2023-02-19 DIAGNOSIS — I2510 Atherosclerotic heart disease of native coronary artery without angina pectoris: Secondary | ICD-10-CM

## 2023-02-19 DIAGNOSIS — I5032 Chronic diastolic (congestive) heart failure: Secondary | ICD-10-CM

## 2023-02-19 DIAGNOSIS — I10 Essential (primary) hypertension: Secondary | ICD-10-CM | POA: Diagnosis not present

## 2023-02-19 DIAGNOSIS — N184 Chronic kidney disease, stage 4 (severe): Secondary | ICD-10-CM | POA: Diagnosis not present

## 2023-02-19 DIAGNOSIS — I3139 Other pericardial effusion (noninflammatory): Secondary | ICD-10-CM

## 2023-02-19 DIAGNOSIS — E782 Mixed hyperlipidemia: Secondary | ICD-10-CM

## 2023-02-19 NOTE — Patient Instructions (Signed)
Medication Instructions:  Your physician recommends that you continue on your current medications as directed. Please refer to the Current Medication list given to you today.  *If you need a refill on your cardiac medications before your next appointment, please call your pharmacy*    Testing/Procedures: Your physician has requested that you have an echocardiogram. Echocardiography is a painless test that uses sound waves to create images of your heart. It provides your doctor with information about the size and shape of your heart and how well your heart's chambers and valves are working. This procedure takes approximately one hour. There are no restrictions for this procedure. Please do NOT wear cologne, perfume, aftershave, or lotions (deodorant is allowed). Please arrive 15 minutes prior to your appointment time. This will take place at [address] [place] **To do in October**    Follow-Up: At [facility], you and your health needs are our priority.  As part of our continuing mission to provide you with exceptional heart care, we have created designated Provider Care Teams.  These Care Teams include your primary Cardiologist (physician) and Advanced Practice Providers (APPs -  Physician Assistants and Nurse Practitioners) who all work together to provide you with the care you need, when you need it.  We recommend signing up for the patient portal called "MyChart".  Sign up information is provided on this After Visit Summary.  MyChart is used to connect with patients for Virtual Visits (Telemedicine).  Patients are able to view lab/test results, encounter notes, upcoming appointments, etc.  Non-urgent messages can be sent to your provider as well.   To learn more about what you can do with MyChart, go to [email or web address].    Your next appointment:   3 month(s)  Provider:   Marjie Skiff, PA-C, Joni Reining, DNP, ANP, Azalee Course, PA-C, or Bernadene Person, NP       Then, Nanetta Batty, MD will plan to see you again in 6 month(s).

## 2023-02-19 NOTE — Assessment & Plan Note (Signed)
History of CAD status post cardiac catheterization in [place] in 2010 but apparently no stents were implanted.  He denies chest pain.

## 2023-02-19 NOTE — Assessment & Plan Note (Signed)
History of hyperlipidemia on statin therapy with lipid profile performed 4//24 revealing total cholesterol 134, LDL 74 and HDL 38.

## 2023-02-19 NOTE — Assessment & Plan Note (Signed)
Patient was recently hospitalized with diastolic heart failure.  Echo showed grade 3 diastolic dysfunction.  He had not taken his torsemide for the 3 weeks prior to his admission because of inability to get his medications.  He was diuresed over 6 L.  He appears euvolemic now on exam.  He is aware of salt intake.  He is on high-dose torsemide.

## 2023-02-19 NOTE — Assessment & Plan Note (Signed)
History of essential hypertension blood pressure measured today at 128/78.  He is on amlodipine, carvedilol, and BiDil.

## 2023-02-19 NOTE — Assessment & Plan Note (Signed)
History of moderate pericardial effusion by recent 2D echo performed during hospitalization 02/14/2023 revealing normal LV systolic function with a moderate pericardial effusion without evidence of tamponade.  He is back on his diuretics.  Will recheck a 2D echo in 3 months.  I suspect this is a uremic pericardial effusion.

## 2023-02-19 NOTE — Progress Notes (Signed)
02/19/2023 George Day   [birth year]  [id number]  Primary Physician Ngetich, Donalee Citrin, NP Primary Cardiologist: Runell Gess MD Roseanne Reno  HPI:  George Day is a 47 y.o.   mildly overweight married African-American American male father of 4 daughters who relocated from [place] to [place].  He is accompanied by his wife George Day .  I initially saw him during his hospitalization on 11/15/2020.  He has a history of difficult to control hypertension, diet-controlled diabetes and hyperlipidemia as well as moderate renal insufficiency.  He had renal Doppler studies that did not show evidence of renal artery stenosis, 2D echo that showed low normal EF with moderate concentric LVH and Myoview stress test that was low risk as well without significant ischemia.    Since I saw him 2 years ago he was hospitalized last week with volume overload secondary to not taking his diuretics for a week and a half.  Does have stage IV CKD with serum creatinine in the 5 range.  His echo revealed normal LV systolic function, grade 3 diastolic dysfunction with a moderate size circumferential pericardial effusion that did not have tamponade physiology.  He was diuresed over 6 L and was discharged home on the 28th.  He appears euvolemic today.  He is aware of his salt restriction.   Current Meds  Medication Sig   amLODipine (NORVASC) 10 MG tablet Take 1 tablet (10 mg total) by mouth daily.   aspirin EC 81 MG tablet Take 1 tablet (81 mg total) by mouth daily.   atorvastatin (LIPITOR) 40 MG tablet Take 1 tablet (40 mg total) by mouth daily.   carvedilol (COREG) 25 MG tablet Take 2 tablets (50 mg total) by mouth 2 (two) times daily.   famotidine (PEPCID) 20 MG tablet Take 1 tablet (20 mg total) by mouth daily.   Fluticasone-Umeclidin-Vilant (TRELEGY ELLIPTA) 100-62.5-25 MCG/ACT AEPB Inhale 1 puff into the lungs daily.   isosorbide-hydrALAZINE (BIDIL) 20-37.5 MG tablet Take 1 tablet by mouth  3 (three) times daily.   nitroGLYCERIN (NITROSTAT) 0.6 MG SL tablet Place 1 tablet (0.6 mg total) under the tongue every 5 (five) minutes as needed for chest pain.   potassium chloride SA (KLOR-CON M) 20 MEQ tablet Take 1 tablet (20 mEq total) by mouth daily.   torsemide (DEMADEX) 20 MG tablet Take 2 tablets (40 mg total) by mouth daily.     Allergies  Allergen Reactions   Honey Bee Venom [Bee Venom] Anaphylaxis   Penicillins Anaphylaxis    ALL   Tomato Swelling    Social History   Socioeconomic History   Marital status: Married    Spouse name: Not on file   Number of children: 4   Years of education: Not on file   Highest education level: 10th grade  Occupational History   Occupation: Hydrologist  Tobacco Use   Smoking status: Former    Packs/day: 0.50    Years: 31.00    Additional pack years: 0.00    Total pack years: 15.50    Types: Cigars, E-cigarettes, Cigarettes    Quit date: 09/2021    Years since quitting: 1.4   Smokeless tobacco: Never  Vaping Use   Vaping Use: Never used  Substance and Sexual Activity   Alcohol use: Not Currently    Comment: occasioanlly    Drug use: Yes    Types: Marijuana    Comment: 2 x week   Sexual activity: Yes  Birth control/protection: None  Other Topics Concern   Not on file  Social History Narrative   Not on file   Social Determinants of Health   Financial Resource Strain: Medium Risk (09/20/2022)   Overall Financial Resource Strain (CARDIA)    Difficulty of Paying Living Expenses: Somewhat hard  Food Insecurity: No Food Insecurity (02/13/2023)   Hunger Vital Sign    Worried About Running Out of Food in the Last Year: Never true    Ran Out of Food in the Last Year: Never true  Transportation Needs: No Transportation Needs (02/13/2023)   PRAPARE - Administrator, Civil Service (Medical): No    Lack of Transportation (Non-Medical): No  Physical Activity: Not on file  Stress: Not on file  Social  Connections: Not on file  Intimate Partner Violence: Not At Risk (02/13/2023)   Humiliation, Afraid, Rape, and Kick questionnaire    Fear of Current or Ex-Partner: No    Emotionally Abused: No    Physically Abused: No    Sexually Abused: No     Review of Systems: General: negative for chills, fever, night sweats or weight changes.  Cardiovascular: negative for chest pain, dyspnea on exertion, edema, orthopnea, palpitations, paroxysmal nocturnal dyspnea or shortness of breath Dermatological: negative for rash Respiratory: negative for cough or wheezing Urologic: negative for hematuria Abdominal: negative for nausea, vomiting, diarrhea, bright red blood per rectum, melena, or hematemesis Neurologic: negative for visual changes, syncope, or dizziness All other systems reviewed and are otherwise negative except as noted above.    Blood pressure 128/78, pulse (!) 56, height 5\' 9"  (1.753 m), weight 202 lb 3.2 oz (91.7 kg), SpO2 99 %.  General appearance: alert and no distress Neck: no adenopathy, no carotid bruit, no JVD, supple, symmetrical, trachea midline, and thyroid not enlarged, symmetric, no tenderness/mass/nodules Lungs: clear to auscultation bilaterally Heart: regular rate and rhythm, S1, S2 normal, no murmur, click, rub or gallop Extremities: extremities normal, atraumatic, no cyanosis or edema Pulses: 2+ and symmetric Skin: Skin color, texture, turgor normal. No rashes or lesions Neurologic: Grossly normal  EKG not performed today      ASSESSMENT AND PLAN:   Accelerated hypertension History of essential hypertension blood pressure measured today at 128/78.  He is on amlodipine, carvedilol, and BiDil.  Coronary artery disease involving native coronary artery of native heart without angina pectoris History of CAD status post cardiac catheterization in [place] in 2010 but apparently no stents were implanted.  He denies chest pain.  Hyperlipidemia History of  hyperlipidemia on statin therapy with lipid profile performed 4//24 revealing total cholesterol 134, LDL 74 and HDL 38.  Pericardial effusion History of moderate pericardial effusion by recent 2D echo performed during hospitalization 02/14/2023 revealing normal LV systolic function with a moderate pericardial effusion without evidence of tamponade.  He is back on his diuretics.  Will recheck a 2D echo in 3 months.  I suspect this is a uremic pericardial effusion.  Chronic diastolic heart failure [facility]) Patient was recently hospitalized with diastolic heart failure.  Echo showed grade 3 diastolic dysfunction.  He had not taken his torsemide for the 3 weeks prior to his admission because of inability to get his medications.  He was diuresed over 6 L.  He appears euvolemic now on exam.  He is aware of salt intake.  He is on high-dose torsemide.     Runell Gess MD FACP,FACC,FAHA, [facility] 02/19/2023 12:30 PM

## 2023-02-25 ENCOUNTER — Other Ambulatory Visit: Payer: Self-pay

## 2023-02-27 ENCOUNTER — Ambulatory Visit: Payer: Self-pay

## 2023-02-27 NOTE — Patient Instructions (Signed)
Visit Information  Thank you for taking time to visit with me today. Please don't hesitate to contact me if I can be of assistance to you.   Following are the goals we discussed today:   Goals Addressed             This Visit's Progress    To schedule a follow up visit with kidney specialist       Care Coordination Interventions: Assessed the Patient understanding of chronic kidney disease    Evaluation of current treatment plan related to chronic kidney disease self management and patient's adherence to plan as established by provider      Discussed the impact of chronic kidney disease on daily life and mental health and acknowledged and normalized feelings of disempowerment, fear, and frustration    Provided education on kidney disease progression    Educated patient and wife about the stages of CKD, reviewed with patient current GRF Discussed plans with patient for ongoing care coordination follow up and provided patient with direct contact information for nurse care coordinator    Reviewed and discussed recent referral for [facility], provided patient with the contact number and instructed patient to call for an appointment ASAP Mailed printed educational materials related to ESRD Last practice recorded BP readings:  BP Readings from Last 3 Encounters:  02/19/23 128/78  02/15/23 (!) 153/90  11/21/22 (!) 140/80  Most recent eGFR/CrCl:  Lab Results  Component Value Date   EGFR 13 (L) 02/15/2023    No components found for: "CRCL"            Our next appointment is by telephone on 03/27/23 at 09:30 AM  Please call the care guide team at [phone] if you need to cancel or reschedule your appointment.   If you are experiencing a Mental Health or Behavioral Health Crisis or need someone to talk to, please call 1-800-273-TALK (toll free, 24 hour hotline)  Patient verbalizes understanding of instructions and care plan provided today and agrees to view in MyChart.  Active MyChart status and patient understanding of how to access instructions and care plan via MyChart confirmed with patient.     [name], RN, BSN, CCM Care Management Coordinator [facility] Care Management Direct Phone: [phone]

## 2023-02-27 NOTE — Patient Outreach (Signed)
  Care Coordination   Initial Visit Note   02/27/2023 Name: George Day MRN: [id number] DOB: 05/16/1976  George Day is a 47 y.o. year old male who sees Ngetich, Dinah C, NP for primary care. I spoke with  George Day by phone today.  What matters to the patients health and wellness today?  Patient would like to re-establish with a kidney specialist for evaluation/treatment of his ESRD.     Goals Addressed             This Visit's Progress    To schedule a follow up visit with kidney specialist       Care Coordination Interventions: Assessed the Patient understanding of chronic kidney disease    Evaluation of current treatment plan related to chronic kidney disease self management and patient's adherence to plan as established by provider      Discussed the impact of chronic kidney disease on daily life and mental health and acknowledged and normalized feelings of disempowerment, fear, and frustration    Provided education on kidney disease progression    Educated patient and wife about the stages of CKD, reviewed with patient current GRF Discussed plans with patient for ongoing care coordination follow up and provided patient with direct contact information for nurse care coordinator    Reviewed and discussed recent referral for [facility], provided patient with the contact number and instructed patient to call for an appointment ASAP Mailed printed educational materials related to ESRD Last practice recorded BP readings:  BP Readings from Last 3 Encounters:  02/19/23 128/78  02/15/23 (!) 153/90  11/21/22 (!) 140/80  Most recent eGFR/CrCl:  Lab Results  Component Value Date   EGFR 13 (L) 02/15/2023    No components found for: "CRCL"        Interventions Today    Flowsheet Row Most Recent Value  Chronic Disease   Chronic disease during today's visit Chronic Kidney Disease/End Stage Renal Disease (ESRD), Congestive Heart Failure (CHF)  General Interventions    General Interventions Discussed/Reviewed General Interventions Discussed, General Interventions Reviewed, Labs, Doctor Visits  Doctor Visits Discussed/Reviewed Doctor Visits Reviewed, Doctor Visits Discussed, Specialist  Education Interventions   Education Provided Provided Education, Provided Printed Education  Provided Verbal Education On When to see the doctor, Labs  Labs Reviewed Kidney Function          SDOH assessments and interventions completed:  No     Care Coordination Interventions:  Yes, provided   Follow up plan: Follow up call scheduled for 03/27/23 @09 :30 AM    Encounter Outcome:  Pt. Visit Completed

## 2023-03-01 ENCOUNTER — Telehealth (HOSPITAL_COMMUNITY): Payer: Self-pay

## 2023-03-01 ENCOUNTER — Ambulatory Visit (INDEPENDENT_AMBULATORY_CARE_PROVIDER_SITE_OTHER): Payer: 59 | Admitting: Student

## 2023-03-01 VITALS — BP 177/102 | HR 78 | Temp 98.5°F | Ht 69.0 in | Wt 205.5 lb

## 2023-03-01 DIAGNOSIS — I5032 Chronic diastolic (congestive) heart failure: Secondary | ICD-10-CM | POA: Diagnosis not present

## 2023-03-01 DIAGNOSIS — N185 Chronic kidney disease, stage 5: Secondary | ICD-10-CM | POA: Diagnosis not present

## 2023-03-01 DIAGNOSIS — I132 Hypertensive heart and chronic kidney disease with heart failure and with stage 5 chronic kidney disease, or end stage renal disease: Secondary | ICD-10-CM

## 2023-03-01 DIAGNOSIS — D509 Iron deficiency anemia, unspecified: Secondary | ICD-10-CM

## 2023-03-01 DIAGNOSIS — R29818 Other symptoms and signs involving the nervous system: Secondary | ICD-10-CM

## 2023-03-01 DIAGNOSIS — I10 Essential (primary) hypertension: Secondary | ICD-10-CM

## 2023-03-01 DIAGNOSIS — Z87891 Personal history of nicotine dependence: Secondary | ICD-10-CM | POA: Diagnosis not present

## 2023-03-01 DIAGNOSIS — G35 Multiple sclerosis: Secondary | ICD-10-CM

## 2023-03-01 NOTE — Assessment & Plan Note (Signed)
CBC 02/14/2023 revealed hemoglobin of 8.4, MCV 69, RDW 17.9. This microcytic anemia seems to be ongoing since at least 01/05/2020, which is the first CBC I can access.  Plan: -CBC -Consider iron studies, additional workup for iron deficiency anemia such as age-appropriate cancer screening.

## 2023-03-01 NOTE — Patient Instructions (Signed)
Thank you, [name] for allowing [place] to provide your care today. Today we discussed CKD, heart failure, MS.    I have ordered the following labs for you:  Lab Orders         BMP8+Anion Gap         CBC no Diff       Referrals ordered today:   Referral Orders         Ambulatory referral to Neurology       I have ordered the following medication/changed the following medications:   Stop the following medications: There are no discontinued medications.   Start the following medications: No orders of the defined types were placed in this encounter.    Follow up: 2 months   We look forward to seeing you next time. Please call our clinic at [phone] if you have any questions or concerns. The best time to call is Monday-Friday from 9am-4pm, but there is someone available 24/7. If after hours or the weekend, call the main hospital number and ask for the Internal Medicine Resident On-Call. If you need medication refills, please notify your pharmacy one week in advance and they will send [place] a request.   Thank you for trusting me with your care. Wishing you the best!  [name] MD [facility]

## 2023-03-01 NOTE — Telephone Encounter (Signed)
Called to confirm Heart & Vascular Transitions of Care appointment. Patient reminded to bring all medications and pill box organizer with them. Confirmed patient has transportation. Gave directions, instructed to utilize valet parking.  Confirmed appointment prior to ending call.   

## 2023-03-01 NOTE — Assessment & Plan Note (Addendum)
Per patient, he has a history of MS diagnosed in 2022.  He moved up to [place] and then back to [place] in the interim.  It seems that during this time he was lost to follow-up and medical management for his presumed MS was never begun.  Patient endorses right leg weakness which is chronic and unchanged. On exam for [place] today his right knee flexion, knee extension, hip flexion, dorsi and plantarflexion are 4/5, patellar reflexes brisk on the right side.  Plan: -Referral to Neurology

## 2023-03-01 NOTE — Assessment & Plan Note (Signed)
Echocardiogram demonstrated preserved LV systolic function with EF 50 to 55%, severe concentric LVH, RV systolic function is preserved. LA with moderate dilatation. Large pericardial effusion. Patient followed up with cardiology on 02/19/2023 with Nanetta Batty, MD.   Patient euvolemic on exam today, the 10 pound weight gain since hospital discharge.  Denies shortness of breath, chest pain, cough.

## 2023-03-01 NOTE — Assessment & Plan Note (Signed)
Patient's blood pressure 177/102 today, his blood pressure at his recent cardiology visit 02/19/2023 was normal.  Current medication regimen is carvedilol, isosorbide, amlodipine and Bidil.  Patient says he has not taken his carvedilol or amlodipine today.  His Bidil is prescribed 3 times daily, over the patient since he is taking it only once a day currently.  We discussed the importance of continue to take his antihypertensive medication, and urged the patient to take his medications when he returns home.  Patient was instructed to call our office if he had persistent hypertension once he returned home.  Patient was encouraged to keep a blood pressure log and bring it to his next visit.   Plan: -Blood pressure log -Encourage medication compliance -BMP

## 2023-03-01 NOTE — Progress Notes (Signed)
Subjective:  CC: Hospital follow-up  HPI:  [name] is a 47 y.o. male with a past medical history stated below and presents today for hospital follow-up. Please see problem based assessment and plan for additional details.  Past Medical History:  Diagnosis Date   Anemia 1998   Anginal pain (HCC)    Anxiety    Asthma    Coronary artery disease    Diabetes mellitus without complication (HCC) 1995   Hypertension 2019   MI (myocardial infarction) (HCC) 2010   Sleep apnea    Stroke [facility]) 2010    Current Outpatient Medications on File Prior to Visit  Medication Sig Dispense Refill   amLODipine (NORVASC) 10 MG tablet Take 1 tablet (10 mg total) by mouth daily. 90 tablet 1   aspirin EC 81 MG tablet Take 1 tablet (81 mg total) by mouth daily. 90 tablet 1   atorvastatin (LIPITOR) 40 MG tablet Take 1 tablet (40 mg total) by mouth daily. 90 tablet 1   carvedilol (COREG) 25 MG tablet Take 2 tablets (50 mg total) by mouth 2 (two) times daily. 360 tablet 1   famotidine (PEPCID) 20 MG tablet Take 1 tablet (20 mg total) by mouth daily. 90 tablet 1   Fluticasone-Umeclidin-Vilant (TRELEGY ELLIPTA) 100-62.5-25 MCG/ACT AEPB Inhale 1 puff into the lungs daily. 60 each 5   isosorbide-hydrALAZINE (BIDIL) 20-37.5 MG tablet Take 1 tablet by mouth 3 (three) times daily. 90 tablet 0   nitroGLYCERIN (NITROSTAT) 0.6 MG SL tablet Place 1 tablet (0.6 mg total) under the tongue every 5 (five) minutes as needed for chest pain. 100 tablet 5   potassium chloride SA (KLOR-CON M) 20 MEQ tablet Take 1 tablet (20 mEq total) by mouth daily. 60 tablet 2   torsemide (DEMADEX) 20 MG tablet Take 2 tablets (40 mg total) by mouth daily. 60 tablet 0   No current facility-administered medications on file prior to visit.    Family History  Problem Relation Age of Onset   Diabetes Mother    Hyperlipidemia Mother    Diabetes Sister    Hyperlipidemia Sister    Hearing loss Sister    Diabetes Brother     Hyperlipidemia Brother     Social History   Socioeconomic History   Marital status: Married    Spouse name: Not on file   Number of children: 4   Years of education: Not on file   Highest education level: 10th grade  Occupational History   Occupation: Hydrologist  Tobacco Use   Smoking status: Former    Current packs/day: 0.00    Average packs/day: 0.5 packs/day for 31.0 years (15.5 ttl pk-yrs)    Types: Cigars, E-cigarettes, Cigarettes    Start date: 09/1990    Quit date: 09/2021    Years since quitting: 1.4   Smokeless tobacco: Never  Vaping Use   Vaping status: Never Used  Substance and Sexual Activity   Alcohol use: Not Currently    Comment: occasioanlly    Drug use: Yes    Types: Marijuana    Comment: 2 x week   Sexual activity: Yes    Birth control/protection: None  Other Topics Concern   Not on file  Social History Narrative   Not on file   Social Determinants of Health   Financial Resource Strain: Medium Risk (09/20/2022)   Overall Financial Resource Strain (CARDIA)    Difficulty of Paying Living Expenses: Somewhat hard  Food Insecurity: No Food Insecurity (02/13/2023)  Hunger Vital Sign    Worried About Running Out of Food in the Last Year: Never true    Ran Out of Food in the Last Year: Never true  Transportation Needs: No Transportation Needs (02/13/2023)   PRAPARE - Administrator, Civil Service (Medical): No    Lack of Transportation (Non-Medical): No  Physical Activity: Not on file  Stress: Not on file  Social Connections: Not on file  Intimate Partner Violence: Not At Risk (02/13/2023)   Humiliation, Afraid, Rape, and Kick questionnaire    Fear of Current or Ex-Partner: No    Emotionally Abused: No    Physically Abused: No    Sexually Abused: No    Review of Systems: ROS negative except for what is noted on the assessment and plan.  Objective:   Vitals:   03/01/23 0856 03/01/23 0949  BP: (!) 168/102 (!) 177/102   Pulse: 86 78  Temp: 98.5 F (36.9 C)   TempSrc: Oral   SpO2: 99%   Weight: 205 lb 8 oz (93.2 kg)   Height: 5\' 9"  (1.753 m)     Physical Exam: Constitutional: well-appearing, in no acute distress HENT: normocephalic atraumatic, mucous membranes moist Eyes: conjunctiva non-erythematous Neck: supple Cardiovascular: regular rate and rhythm, grade 2 systolic murmur best heard at the lower left sternal border.  No lower extremity edema present, no JVD elevation Pulmonary/Chest: normal work of breathing on room air, lungs clear to auscultation bilaterally Abdominal: soft, non-tender, non-distended MSK: normal bulk and tone Neurological: alert & oriented x 3, 4/5 strength of the right lower extremity.  Brisk patellar reflex,  Skin: warm and dry Psych: Normal mood and affect   Assessment & Plan:  Chronic diastolic heart failure (HCC) Echocardiogram demonstrated preserved LV systolic function with EF 50 to 55%, severe concentric LVH, RV systolic function is preserved. LA with moderate dilatation. Large pericardial effusion. Patient followed up with cardiology on 02/19/2023 with Nanetta Batty, MD.   Patient euvolemic on exam today, the 10 pound weight gain since hospital discharge.  Denies shortness of breath, chest pain, cough.  Chronic focal neurological deficit Per patient, he has a history of MS diagnosed in 2022.  He moved up to [place] and then back to [place] in the interim.  It seems that during this time he was lost to follow-up and medical management for his presumed MS was never begun.  Patient endorses right leg weakness which is chronic and unchanged. On exam for [place] today his right knee flexion, knee extension, hip flexion, dorsi and plantarflexion are 4/5, patellar reflexes brisk on the right side.  Plan: -Referral to Neurology  CKD (chronic kidney disease) stage 5, GFR less than 15 ml/min (HCC) Stage V CKD, heart failure with preserved ejection fractionAdmitted for  volume overload on 02/12/2023, discharged 02/15/2023.  At the time of his discharge his serum creatinine was 5.14, potassium 3.1, BUN 52, sodium 135.  He has an appointment with nephrology on the 28th.  Patient states he was recently contacted by the nephrology nurse who discussed with him the likely need to begin dialysis soon.  He is currently on torsemide 40 mg po daily, and 20 meq K+.   Microcytic anemia CBC 02/14/2023 revealed hemoglobin of 8.4, MCV 69, RDW 17.9. This microcytic anemia seems to be ongoing since at least 01/05/2020, which is the first CBC I can access.  Plan: -CBC -Consider iron studies, additional workup for iron deficiency anemia such as age-appropriate cancer screening.  Essential hypertension  Patient's blood pressure 177/102 today, his blood pressure at his recent cardiology visit 02/19/2023 was normal.  Current medication regimen is carvedilol, isosorbide, amlodipine and Bidil.  Patient says he has not taken his carvedilol or amlodipine today.  His Bidil is prescribed 3 times daily, over the patient since he is taking it only once a day currently.  We discussed the importance of continue to take his antihypertensive medication, and urged the patient to take his medications when he returns home.  Patient was instructed to call our office if he had persistent hypertension once he returned home.  Patient was encouraged to keep a blood pressure log and bring it to his next visit.   Plan: -Blood pressure log -Encourage medication compliance -BMP   Patient seen with Dr. Julian Reil MD [facility]  PGY-[id number] Pager: [phone]  Phone: [phone] Date 03/01/2023  Time 4:45 PM

## 2023-03-01 NOTE — Assessment & Plan Note (Signed)
Stage V CKD, heart failure with preserved ejection fractionAdmitted for volume overload on 02/12/2023, discharged 02/15/2023.  At the time of his discharge his serum creatinine was 5.14, potassium 3.1, BUN 52, sodium 135.  He has an appointment with nephrology on the 28th.  Patient states he was recently contacted by the nephrology nurse who discussed with him the likely need to begin dialysis soon.  He is currently on torsemide 40 mg po daily, and 20 meq K+.

## 2023-03-02 LAB — BMP8+ANION GAP

## 2023-03-02 LAB — CBC
Hemoglobin: 9.9 g/dL — ABNORMAL LOW (ref 13.0–17.7)
RDW: 17.7 % — ABNORMAL HIGH (ref 11.6–15.4)
WBC: 6.6 10*3/uL (ref 3.4–10.8)

## 2023-03-03 ENCOUNTER — Other Ambulatory Visit: Payer: Self-pay

## 2023-03-03 ENCOUNTER — Inpatient Hospital Stay (HOSPITAL_COMMUNITY)
Admission: EM | Admit: 2023-03-03 | Discharge: 2023-03-07 | DRG: 291 | Disposition: A | Discharge status: Home / Self Care | Payer: 59 | Attending: Internal Medicine | Admitting: Internal Medicine

## 2023-03-03 ENCOUNTER — Emergency Department (HOSPITAL_COMMUNITY): Payer: 59

## 2023-03-03 ENCOUNTER — Encounter (HOSPITAL_COMMUNITY): Payer: Self-pay

## 2023-03-03 DIAGNOSIS — I2510 Atherosclerotic heart disease of native coronary artery without angina pectoris: Secondary | ICD-10-CM | POA: Diagnosis not present

## 2023-03-03 DIAGNOSIS — D638 Anemia in other chronic diseases classified elsewhere: Secondary | ICD-10-CM | POA: Diagnosis not present

## 2023-03-03 DIAGNOSIS — Z5986 Financial insecurity: Secondary | ICD-10-CM

## 2023-03-03 DIAGNOSIS — E1122 Type 2 diabetes mellitus with diabetic chronic kidney disease: Secondary | ICD-10-CM | POA: Diagnosis not present

## 2023-03-03 DIAGNOSIS — N186 End stage renal disease: Secondary | ICD-10-CM | POA: Diagnosis present

## 2023-03-03 DIAGNOSIS — I252 Old myocardial infarction: Secondary | ICD-10-CM

## 2023-03-03 DIAGNOSIS — Z555 Less than a high school diploma: Secondary | ICD-10-CM

## 2023-03-03 DIAGNOSIS — Z8673 Personal history of transient ischemic attack (TIA), and cerebral infarction without residual deficits: Secondary | ICD-10-CM | POA: Diagnosis not present

## 2023-03-03 DIAGNOSIS — Z91018 Allergy to other foods: Secondary | ICD-10-CM | POA: Diagnosis not present

## 2023-03-03 DIAGNOSIS — Z83438 Family history of other disorder of lipoprotein metabolism and other lipidemia: Secondary | ICD-10-CM

## 2023-03-03 DIAGNOSIS — I700 Atherosclerosis of aorta: Secondary | ICD-10-CM | POA: Diagnosis not present

## 2023-03-03 DIAGNOSIS — I5020 Unspecified systolic (congestive) heart failure: Secondary | ICD-10-CM

## 2023-03-03 DIAGNOSIS — D509 Iron deficiency anemia, unspecified: Secondary | ICD-10-CM | POA: Diagnosis present

## 2023-03-03 DIAGNOSIS — I5032 Chronic diastolic (congestive) heart failure: Secondary | ICD-10-CM | POA: Diagnosis present

## 2023-03-03 DIAGNOSIS — E278 Other specified disorders of adrenal gland: Secondary | ICD-10-CM | POA: Diagnosis not present

## 2023-03-03 DIAGNOSIS — E279 Disorder of adrenal gland, unspecified: Secondary | ICD-10-CM | POA: Diagnosis present

## 2023-03-03 DIAGNOSIS — N189 Chronic kidney disease, unspecified: Secondary | ICD-10-CM

## 2023-03-03 DIAGNOSIS — E785 Hyperlipidemia, unspecified: Secondary | ICD-10-CM | POA: Diagnosis not present

## 2023-03-03 DIAGNOSIS — L732 Hidradenitis suppurativa: Secondary | ICD-10-CM | POA: Diagnosis present

## 2023-03-03 DIAGNOSIS — J4489 Other specified chronic obstructive pulmonary disease: Secondary | ICD-10-CM | POA: Diagnosis present

## 2023-03-03 DIAGNOSIS — L02412 Cutaneous abscess of left axilla: Secondary | ICD-10-CM | POA: Diagnosis present

## 2023-03-03 DIAGNOSIS — Z79899 Other long term (current) drug therapy: Secondary | ICD-10-CM | POA: Diagnosis not present

## 2023-03-03 DIAGNOSIS — D631 Anemia in chronic kidney disease: Secondary | ICD-10-CM | POA: Diagnosis not present

## 2023-03-03 DIAGNOSIS — D539 Nutritional anemia, unspecified: Secondary | ICD-10-CM | POA: Diagnosis not present

## 2023-03-03 DIAGNOSIS — Z992 Dependence on renal dialysis: Secondary | ICD-10-CM | POA: Diagnosis not present

## 2023-03-03 DIAGNOSIS — I3139 Other pericardial effusion (noninflammatory): Secondary | ICD-10-CM | POA: Diagnosis not present

## 2023-03-03 DIAGNOSIS — E2749 Other adrenocortical insufficiency: Secondary | ICD-10-CM | POA: Diagnosis not present

## 2023-03-03 DIAGNOSIS — F419 Anxiety disorder, unspecified: Secondary | ICD-10-CM | POA: Diagnosis present

## 2023-03-03 DIAGNOSIS — Z87891 Personal history of nicotine dependence: Secondary | ICD-10-CM | POA: Diagnosis not present

## 2023-03-03 DIAGNOSIS — R066 Hiccough: Secondary | ICD-10-CM | POA: Diagnosis not present

## 2023-03-03 DIAGNOSIS — Z7982 Long term (current) use of aspirin: Secondary | ICD-10-CM

## 2023-03-03 DIAGNOSIS — I132 Hypertensive heart and chronic kidney disease with heart failure and with stage 5 chronic kidney disease, or end stage renal disease: Secondary | ICD-10-CM | POA: Diagnosis not present

## 2023-03-03 DIAGNOSIS — G35 Multiple sclerosis: Secondary | ICD-10-CM | POA: Diagnosis not present

## 2023-03-03 DIAGNOSIS — R109 Unspecified abdominal pain: Secondary | ICD-10-CM | POA: Diagnosis not present

## 2023-03-03 DIAGNOSIS — E871 Hypo-osmolality and hyponatremia: Secondary | ICD-10-CM | POA: Diagnosis not present

## 2023-03-03 DIAGNOSIS — R110 Nausea: Secondary | ICD-10-CM | POA: Diagnosis not present

## 2023-03-03 DIAGNOSIS — Z86711 Personal history of pulmonary embolism: Secondary | ICD-10-CM

## 2023-03-03 DIAGNOSIS — Z91030 Bee allergy status: Secondary | ICD-10-CM | POA: Diagnosis not present

## 2023-03-03 DIAGNOSIS — Z539 Procedure and treatment not carried out, unspecified reason: Secondary | ICD-10-CM | POA: Diagnosis present

## 2023-03-03 DIAGNOSIS — N19 Unspecified kidney failure: Secondary | ICD-10-CM | POA: Diagnosis not present

## 2023-03-03 DIAGNOSIS — Z880 Allergy status to penicillin: Secondary | ICD-10-CM | POA: Diagnosis not present

## 2023-03-03 DIAGNOSIS — N185 Chronic kidney disease, stage 5: Secondary | ICD-10-CM

## 2023-03-03 DIAGNOSIS — I120 Hypertensive chronic kidney disease with stage 5 chronic kidney disease or end stage renal disease: Secondary | ICD-10-CM | POA: Diagnosis not present

## 2023-03-03 DIAGNOSIS — I129 Hypertensive chronic kidney disease with stage 1 through stage 4 chronic kidney disease, or unspecified chronic kidney disease: Secondary | ICD-10-CM | POA: Diagnosis not present

## 2023-03-03 DIAGNOSIS — G4730 Sleep apnea, unspecified: Secondary | ICD-10-CM | POA: Diagnosis present

## 2023-03-03 DIAGNOSIS — I5030 Unspecified diastolic (congestive) heart failure: Secondary | ICD-10-CM | POA: Diagnosis not present

## 2023-03-03 DIAGNOSIS — E876 Hypokalemia: Secondary | ICD-10-CM | POA: Diagnosis present

## 2023-03-03 DIAGNOSIS — Z841 Family history of disorders of kidney and ureter: Secondary | ICD-10-CM

## 2023-03-03 DIAGNOSIS — Z813 Family history of other psychoactive substance abuse and dependence: Secondary | ICD-10-CM

## 2023-03-03 DIAGNOSIS — Z833 Family history of diabetes mellitus: Secondary | ICD-10-CM

## 2023-03-03 DIAGNOSIS — I130 Hypertensive heart and chronic kidney disease with heart failure and stage 1 through stage 4 chronic kidney disease, or unspecified chronic kidney disease: Secondary | ICD-10-CM | POA: Diagnosis not present

## 2023-03-03 HISTORY — DX: Multiple sclerosis: G35

## 2023-03-03 HISTORY — DX: Chronic kidney disease, stage 5: N18.5

## 2023-03-03 HISTORY — DX: Unspecified diastolic (congestive) heart failure: I50.30

## 2023-03-03 LAB — CBC WITH DIFFERENTIAL/PLATELET
Abs Immature Granulocytes: 0.02 10*3/uL (ref 0.00–0.07)
Basophils Absolute: 0 10*3/uL (ref 0.0–0.1)
Basophils Relative: 0 %
Eosinophils Absolute: 0.1 10*3/uL (ref 0.0–0.5)
Eosinophils Relative: 1 %
HCT: 29.1 % — ABNORMAL LOW (ref 39.0–52.0)
Hemoglobin: 9.3 g/dL — ABNORMAL LOW (ref 13.0–17.0)
Immature Granulocytes: 0 %
Lymphocytes Relative: 19 %
Lymphs Abs: 1.3 10*3/uL (ref 0.7–4.0)
MCH: 22.2 pg — ABNORMAL LOW (ref 26.0–34.0)
MCHC: 32 g/dL (ref 30.0–36.0)
MCV: 69.5 fL — ABNORMAL LOW (ref 80.0–100.0)
Monocytes Absolute: 0.4 10*3/uL (ref 0.1–1.0)
Monocytes Relative: 7 %
Neutro Abs: 4.9 10*3/uL (ref 1.7–7.7)
Neutrophils Relative %: 73 %
Platelets: 228 10*3/uL (ref 150–400)
RBC: 4.19 MIL/uL — ABNORMAL LOW (ref 4.22–5.81)
RDW: 17.5 % — ABNORMAL HIGH (ref 11.5–15.5)
WBC: 6.7 10*3/uL (ref 4.0–10.5)
nRBC: 0 % (ref 0.0–0.2)

## 2023-03-03 LAB — BMP8+ANION GAP
Anion Gap: 16 mmol/L (ref 10.0–18.0)
BUN/Creatinine Ratio: 10 (ref 9–20)
BUN: 50 mg/dL — ABNORMAL HIGH (ref 6–24)
CO2: 23 mmol/L (ref 20–29)
Chloride: 102 mmol/L (ref 96–106)
Potassium: 3.9 mmol/L (ref 3.5–5.2)
eGFR: 14 mL/min/{1.73_m2} — ABNORMAL LOW (ref >59)

## 2023-03-03 LAB — COMPREHENSIVE METABOLIC PANEL
ALT: 22 U/L (ref 0–44)
AST: 23 U/L (ref 15–41)
Albumin: 3.1 g/dL — ABNORMAL LOW (ref 3.5–5.0)
Alkaline Phosphatase: 64 U/L (ref 38–126)
Anion gap: 10 (ref 5–15)
BUN: 54 mg/dL — ABNORMAL HIGH (ref 6–20)
CO2: 22 mmol/L (ref 22–32)
Calcium: 8.7 mg/dL — ABNORMAL LOW (ref 8.9–10.3)
Chloride: 105 mmol/L (ref 98–111)
Creatinine, Ser: 5.69 mg/dL — ABNORMAL HIGH (ref 0.61–1.24)
GFR, Estimated: 12 mL/min — ABNORMAL LOW (ref >60)
Glucose, Bld: 109 mg/dL — ABNORMAL HIGH (ref 70–99)
Potassium: 3.4 mmol/L — ABNORMAL LOW (ref 3.5–5.1)
Sodium: 137 mmol/L (ref 135–145)
Total Bilirubin: 0.9 mg/dL (ref 0.3–1.2)
Total Protein: 7.1 g/dL (ref 6.5–8.1)

## 2023-03-03 LAB — URINALYSIS, ROUTINE W REFLEX MICROSCOPIC
Bacteria, UA: NONE SEEN
Bilirubin Urine: NEGATIVE
Glucose, UA: NEGATIVE mg/dL
Hgb urine dipstick: NEGATIVE
Ketones, ur: NEGATIVE mg/dL
Nitrite: NEGATIVE
Protein, ur: 100 mg/dL — AB
Specific Gravity, Urine: 1.01 (ref 1.005–1.030)
pH: 6 (ref 5.0–8.0)

## 2023-03-03 LAB — CBC
Hematocrit: 33.3 % — ABNORMAL LOW (ref 37.5–51.0)
MCH: 21.9 pg — ABNORMAL LOW (ref 26.6–33.0)
MCHC: 29.7 g/dL — ABNORMAL LOW (ref 31.5–35.7)
MCV: 74 fL — ABNORMAL LOW (ref 79–97)
Platelets: 283 10*3/uL (ref 150–450)
RBC: 4.53 x10E6/uL (ref 4.14–5.80)

## 2023-03-03 MED ORDER — FAMOTIDINE 20 MG PO TABS
20.0000 mg | ORAL_TABLET | Freq: Every day | ORAL | Status: DC
  Administered 2023-03-04 – 2023-03-07 (×4): 20 mg via ORAL
  Filled 2023-03-03 (×4): qty 1

## 2023-03-03 MED ORDER — CARVEDILOL 25 MG PO TABS
50.0000 mg | ORAL_TABLET | Freq: Two times a day (BID) | ORAL | Status: DC
  Administered 2023-03-04 – 2023-03-07 (×9): 50 mg via ORAL
  Filled 2023-03-03 (×9): qty 2

## 2023-03-03 MED ORDER — AMLODIPINE BESYLATE 10 MG PO TABS
10.0000 mg | ORAL_TABLET | Freq: Every day | ORAL | Status: DC
  Administered 2023-03-04 – 2023-03-07 (×4): 10 mg via ORAL
  Filled 2023-03-03 (×5): qty 1

## 2023-03-03 MED ORDER — ASPIRIN 81 MG PO TBEC
81.0000 mg | DELAYED_RELEASE_TABLET | Freq: Every day | ORAL | Status: DC
  Administered 2023-03-04 – 2023-03-07 (×3): 81 mg via ORAL
  Filled 2023-03-03 (×3): qty 1

## 2023-03-03 MED ORDER — ISOSORB DINITRATE-HYDRALAZINE 20-37.5 MG PO TABS
1.0000 | ORAL_TABLET | Freq: Three times a day (TID) | ORAL | Status: DC
  Administered 2023-03-04 – 2023-03-07 (×8): 1 via ORAL
  Filled 2023-03-03 (×9): qty 1

## 2023-03-03 MED ORDER — HEPARIN SODIUM (PORCINE) 5000 UNIT/ML IJ SOLN
5000.0000 [IU] | Freq: Three times a day (TID) | INTRAMUSCULAR | Status: DC
  Administered 2023-03-04 – 2023-03-07 (×7): 5000 [IU] via SUBCUTANEOUS
  Filled 2023-03-03 (×7): qty 1

## 2023-03-03 MED ORDER — TORSEMIDE 20 MG PO TABS
40.0000 mg | ORAL_TABLET | Freq: Every day | ORAL | Status: DC
  Administered 2023-03-04 – 2023-03-07 (×4): 40 mg via ORAL
  Filled 2023-03-03 (×4): qty 2

## 2023-03-03 MED ORDER — ONDANSETRON HCL 4 MG/2ML IJ SOLN
4.0000 mg | Freq: Once | INTRAMUSCULAR | Status: AC
  Administered 2023-03-03: 4 mg via INTRAVENOUS
  Filled 2023-03-03: qty 2

## 2023-03-03 MED ORDER — FLUTICASONE FUROATE-VILANTEROL 100-25 MCG/ACT IN AEPB
1.0000 | INHALATION_SPRAY | Freq: Every day | RESPIRATORY_TRACT | Status: DC
  Administered 2023-03-04 – 2023-03-07 (×4): 1 via RESPIRATORY_TRACT
  Filled 2023-03-03: qty 28

## 2023-03-03 MED ORDER — DOCUSATE SODIUM 100 MG PO CAPS
100.0000 mg | ORAL_CAPSULE | Freq: Two times a day (BID) | ORAL | Status: DC
  Administered 2023-03-04 – 2023-03-07 (×7): 100 mg via ORAL
  Filled 2023-03-03 (×8): qty 1

## 2023-03-03 MED ORDER — UMECLIDINIUM BROMIDE 62.5 MCG/ACT IN AEPB
1.0000 | INHALATION_SPRAY | Freq: Every day | RESPIRATORY_TRACT | Status: DC
  Administered 2023-03-04 – 2023-03-07 (×4): 1 via RESPIRATORY_TRACT
  Filled 2023-03-03: qty 7

## 2023-03-03 MED ORDER — POTASSIUM CHLORIDE CRYS ER 20 MEQ PO TBCR
20.0000 meq | EXTENDED_RELEASE_TABLET | Freq: Every day | ORAL | Status: DC
  Administered 2023-03-04 – 2023-03-07 (×4): 20 meq via ORAL
  Filled 2023-03-03 (×4): qty 1

## 2023-03-03 MED ORDER — SODIUM CHLORIDE 0.9 % IV BOLUS
1000.0000 mL | Freq: Once | INTRAVENOUS | Status: AC
  Administered 2023-03-03: 1000 mL via INTRAVENOUS

## 2023-03-03 MED ORDER — MORPHINE SULFATE (PF) 4 MG/ML IV SOLN
4.0000 mg | Freq: Once | INTRAVENOUS | Status: AC
  Administered 2023-03-03: 4 mg via INTRAVENOUS
  Filled 2023-03-03: qty 1

## 2023-03-03 MED ORDER — ATORVASTATIN CALCIUM 40 MG PO TABS
40.0000 mg | ORAL_TABLET | Freq: Every day | ORAL | Status: DC
  Administered 2023-03-04 – 2023-03-07 (×4): 40 mg via ORAL
  Filled 2023-03-03 (×4): qty 1

## 2023-03-03 NOTE — Hospital Course (Addendum)
CKD stage 5 Concern for progression of CKD to stage 5 with GFR 12. He has uremic symptoms that have progressed including hiccups, acute drop in UOP, and end-stage renal disease with prior discussions to start HD, although BUN is only 51. Nephrology consulted to see if initiating HD is necessary for this hospitalization. Nephrology recommends starting HD inpatient, IR and vascular surgery contacted to place tunneled catheter and create fistula in LUE. Patient started HD 7/16.   Iron deficiency anemia MCV has showed a microcytic anemia that seems to be chronic. Ferritin and iron is low, replaced iron with Feraheme. Suspect his anemia is also multifactorial due to CKD, could also be anemia of chronic disease. Calculated iron deficit is 1383mg , so far we've given 510 with Feraheme on 7/15. Will replace with Feraheme on 7/18 as dosing is spaced 72 hours apart.   HTN Home regimen includes amlodipine 10 mg daily, carvedilol 25 mg BID, isosorbide-hydralazine 20-37.5 mg TID, torsemide 40 mg daily.   Hx asthma No respiratory symptoms at this time. Home regimen is trelegy ellipta.  Anemia of chronic disease Suspect 2/2 CKD. Has had colonoscopy as recent is 07/2022 with recommendation for 10 year follow up.  Hx MI s/p cardiac catheterization in 2010 Hx CVA CAD No residual deficits. On ASA 81 mg daily, atorvastatin 40 mg daily. Most recent lipid panel 3 months ago with LDL just above goal at 74.  Hx T2DM Last HbA1c 3 months ago of 6.0%. Not on outpatient therapy for this.   Multiple sclerosis Recent diagnosis. Has not been able to get in with neurology yet.   HFpEF Echocardiogram 01/2023 showed LV EF 50-55% with severe concentric LVH, moderately dilated LA. Clinically euvolemic. Has had decreased UOP since discharge from last admission, concerning in setting of CKD stage 5 for risk of volume overload. Outpatient regimen includes torsemide 40 mg daily.   Right Adrenal Mass Noted on CT, stable,  previously referred to endocrinology for surgical removal.  -Continue to recommend outpatient follow up with Endocrinology7/15 Chest pain, abdominal pain, coming and going over past 3 weeks, worsened yesterday; CVA tenderness, resolved. Started yesterday morning suddenly, woke him up, felt like he "couldn't move" and had to urinate. No blood in urine. Urine amount varies, stays within 6 cups of water daily; metallic taste in mouth yesterday. More hiccuping, yawning.     7/16 Poor sleep last night, patient spoke with nephrology team

## 2023-03-03 NOTE — ED Notes (Signed)
ED TO INPATIENT HANDOFF REPORT  ED Nurse Name and Phone #: Alycia Rossetti [phone]  S Name/Age/Gender George Day 47 y.o. male Room/Bed: H015C/H015C  Code Status   Code Status: Full Code  Home/SNF/Other Home Patient oriented to: self, place, time, and situation Is this baseline? Yes   Triage Complete: Triage complete  Chief Complaint Chronic kidney disease (CKD), stage V (HCC) [N18.5]  Triage Note Pt comes from home with abdominal and bilateral flank pain. Has hx of CKD Stage V  in the process of starting dialysis. Vitals stable. No access, no interventions done via EMS.    Allergies Allergies  Allergen Reactions   Honey Bee Venom [Bee Venom] Anaphylaxis   Penicillins Anaphylaxis    ALL   Tomato Swelling    Level of Care/Admitting Diagnosis ED Disposition     ED Disposition  Admit   Condition  --   Comment  Hospital Area: [facility] [[id number]]  Level of Care: Med-Surg [16]  May admit patient to [facility] or [facility] if equivalent level of care is available:: No  Covid Evaluation: Asymptomatic - no recent exposure (last 10 days) testing not required  Diagnosis: Chronic kidney disease (CKD), stage V [facility]) [[id number]]  Admitting Physician: Dickie La [[id number]]  Attending Physician: Dickie La [[id number]]  Certification:: I certify this patient will need inpatient services for at least 2 midnights  Estimated Length of Stay: 2          B Medical/Surgery History Past Medical History:  Diagnosis Date   Anemia 1998   Anginal pain (HCC)    Anxiety    Asthma    Coronary artery disease    Diabetes mellitus without complication (HCC) 1995   Hypertension 2019   MI (myocardial infarction) (HCC) 2010   Sleep apnea    Stroke (HCC) 2010   Past Surgical History:  Procedure Laterality Date   BIOPSY  08/18/2022   Procedure: BIOPSY;  Surgeon: Imogene Burn, MD;  Location: [facility] ENDOSCOPY;  Service: Gastroenterology;;   CARDIAC CATHETERIZATION  2010    COLONOSCOPY WITH PROPOFOL N/A 08/18/2022   Procedure: COLONOSCOPY WITH PROPOFOL;  Surgeon: Imogene Burn, MD;  Location: [facility] ENDOSCOPY;  Service: Gastroenterology;  Laterality: N/A;   ESOPHAGOGASTRODUODENOSCOPY (EGD) WITH PROPOFOL N/A 08/18/2022   Procedure: ESOPHAGOGASTRODUODENOSCOPY (EGD) WITH PROPOFOL;  Surgeon: Imogene Burn, MD;  Location: [facility] ENDOSCOPY;  Service: Gastroenterology;  Laterality: N/A;   POLYPECTOMY  08/18/2022   Procedure: POLYPECTOMY;  Surgeon: Imogene Burn, MD;  Location: [facility] ENDOSCOPY;  Service: Gastroenterology;;     A IV Location/Drains/Wounds Patient Lines/Drains/Airways Status     Active Line/Drains/Airways     Name Placement date Placement time Site Days   Peripheral IV 03/03/23 20 G Anterior;Left Forearm 03/03/23  1915  Forearm  less than 1            Intake/Output Last 24 hours No intake or output data in the 24 hours ending 03/03/23 2249  Labs/Imaging Results for orders placed or performed during the hospital encounter of 03/03/23 (from the past 48 hour(s))  CBC with Differential     Status: Abnormal   Collection Time: 03/03/23  7:12 PM  Result Value Ref Range   WBC 6.7 4.0 - 10.5 K/uL   RBC 4.19 (L) 4.22 - 5.81 MIL/uL   Hemoglobin 9.3 (L) 13.0 - 17.0 g/dL   HCT 08.6 (L) 57.8 - 46.9 %   MCV 69.5 (L) 80.0 - 100.0 fL   MCH 22.2 (L) 26.0 - 34.0 pg  MCHC 32.0 30.0 - 36.0 g/dL   RDW 16.1 (H) 09.6 - 04.5 %   Platelets 228 150 - 400 K/uL    Comment: REPEATED TO VERIFY   nRBC 0.0 0.0 - 0.2 %   Neutrophils Relative % 73 %   Neutro Abs 4.9 1.7 - 7.7 K/uL   Lymphocytes Relative 19 %   Lymphs Abs 1.3 0.7 - 4.0 K/uL   Monocytes Relative 7 %   Monocytes Absolute 0.4 0.1 - 1.0 K/uL   Eosinophils Relative 1 %   Eosinophils Absolute 0.1 0.0 - 0.5 K/uL   Basophils Relative 0 %   Basophils Absolute 0.0 0.0 - 0.1 K/uL   WBC Morphology MORPHOLOGY UNREMARKABLE    RBC Morphology MORPHOLOGY UNREMARKABLE    Smear Review MORPHOLOGY UNREMARKABLE     Immature Granulocytes 0 %   Abs Immature Granulocytes 0.02 0.00 - 0.07 K/uL    Comment: Performed at [facility], [address], [place], [place] [address]  Comprehensive metabolic panel     Status: Abnormal   Collection Time: 03/03/23  7:12 PM  Result Value Ref Range   Sodium 137 135 - 145 mmol/L   Potassium 3.4 (L) 3.5 - 5.1 mmol/L   Chloride 105 98 - 111 mmol/L   CO2 22 22 - 32 mmol/L   Glucose, Bld 109 (H) 70 - 99 mg/dL    Comment: Glucose reference range applies only to samples taken after fasting for at least 8 hours.   BUN 54 (H) 6 - 20 mg/dL   Creatinine, Ser 1.91 (H) 0.61 - 1.24 mg/dL   Calcium 8.7 (L) 8.9 - 10.3 mg/dL   Total Protein 7.1 6.5 - 8.1 g/dL   Albumin 3.1 (L) 3.5 - 5.0 g/dL   AST 23 15 - 41 U/L   ALT 22 0 - 44 U/L   Alkaline Phosphatase 64 38 - 126 U/L   Total Bilirubin 0.9 0.3 - 1.2 mg/dL   GFR, Estimated 12 (L) >60 mL/min    Comment: (NOTE) Calculated using the CKD-EPI Creatinine Equation (2021)    Anion gap 10 5 - 15    Comment: Performed at [facility], [address] [place], [place] [address]  Urinalysis, Routine w reflex microscopic -Urine, Clean Catch     Status: Abnormal   Collection Time: 03/03/23  9:38 PM  Result Value Ref Range   Color, Urine YELLOW YELLOW   APPearance CLEAR CLEAR   Specific Gravity, Urine 1.010 1.005 - 1.030   pH 6.0 5.0 - 8.0   Glucose, UA NEGATIVE NEGATIVE mg/dL   Hgb urine dipstick NEGATIVE NEGATIVE   Bilirubin Urine NEGATIVE NEGATIVE   Ketones, ur NEGATIVE NEGATIVE mg/dL   Protein, ur 562 (A) NEGATIVE mg/dL   Nitrite NEGATIVE NEGATIVE   Leukocytes,Ua TRACE (A) NEGATIVE   RBC / HPF 0-5 0 - 5 RBC/hpf   WBC, UA 6-10 0 - 5 WBC/hpf   Bacteria, UA NONE SEEN NONE SEEN   Squamous Epithelial / HPF 0-5 0 - 5 /HPF   Mucus PRESENT     Comment: Performed at [facility], [address], [place], [place] [address]   CT Renal Stone Study  Result Date: 03/03/2023 CLINICAL DATA:  Flank pain EXAM: CT ABDOMEN  AND PELVIS WITHOUT CONTRAST TECHNIQUE: Multidetector CT imaging of the abdomen and pelvis was performed following the standard protocol without IV contrast. RADIATION DOSE REDUCTION: This exam was performed according to the departmental dose-optimization program which includes automated exposure control,  adjustment of the mA and/or kV according to patient size and/or use of iterative reconstruction technique. COMPARISON:  CT 08/16/2022, MRI 10/28/2022 FINDINGS: Lower chest: Lung bases demonstrate no acute airspace disease. Cardiomegaly with trace pericardial effusion. Hepatobiliary: No focal liver abnormality is seen. No gallstones, gallbladder wall thickening, or biliary dilatation. Pancreas: Unremarkable. No pancreatic ductal dilatation or surrounding inflammatory changes. Spleen: Normal in size without focal abnormality. Adrenals/Urinary Tract: Stable normal left adrenal gland. Small exophytic hypodensity midpole left kidney, likely a cyst, no imaging follow-up is recommended. Hypodense right adrenal mass with peripheral calcifications, this measures 8 x 4.6 cm, previously 8.2 by 4.8 cm by CT. There is no hydronephrosis or ureteral stone. The bladder is unremarkable Stomach/Bowel: Stomach nonenlarged. No dilated small bowel. Negative appendix. Vascular/Lymphatic: Mild aortic atherosclerosis. No aneurysm. No suspicious lymph nodes. Reproductive: Prostate is unremarkable. Other: Negative for pelvic effusion or free air. Musculoskeletal: No acute or suspicious osseous abnormality IMPRESSION: 1. Negative for hydronephrosis or ureteral stone. 2. Cardiomegaly with trace pericardial effusion. 3. 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 Aortic atherosclerosis. Aortic Atherosclerosis (ICD10-I70.0). Electronically Signed   By: Jasmine Pang M.D.   On: 03/03/2023 19:31    Pending Labs Unresulted  Labs (From admission, onward)     Start     Ordered   03/04/23 0500  CBC  Tomorrow morning,   R        03/03/23 2246   03/04/23 0500  Renal function panel  Tomorrow morning,   R        03/03/23 2246   03/04/23 0500  Iron and TIBC  Tomorrow morning,   R        03/03/23 2246   03/04/23 0500  Ferritin  Tomorrow morning,   R        03/03/23 2246            Vitals/Pain Today's Vitals   03/03/23 1915  BP: (!) 157/90  Pulse: 81  Resp: 18  Temp: 98.5 F (36.9 C)  TempSrc: Oral  SpO2: 98%    Isolation Precautions No active isolations  Medications Medications  heparin injection 5,000 Units (has no administration in time range)  docusate sodium (COLACE) capsule 100 mg (has no administration in time range)  morphine (PF) 4 MG/ML injection 4 mg (4 mg Intravenous Given 03/03/23 1928)  ondansetron (ZOFRAN) injection 4 mg (4 mg Intravenous Given 03/03/23 1927)  sodium chloride 0.9 % bolus 1,000 mL (0 mLs Intravenous Stopped 03/03/23 2237)    Mobility walks     Focused Assessments Renal Assessment Handoff:  Hemodialysis Schedule:  Last Hemodialysis date and time: Will need at some point.    Restricted appendage:     R Recommendations: See Admitting Provider Note  Report given to:   Additional Notes: Pleasant man, alert, oriented, ambulatory. Needs dialysis, never has had it before. Also has mass on kidney per scan.

## 2023-03-03 NOTE — ED Triage Notes (Signed)
Pt comes from home with abdominal and bilateral flank pain. Has hx of CKD Stage V  in the process of starting dialysis. Vitals stable. No access, no interventions done via EMS.

## 2023-03-03 NOTE — ED Provider Notes (Signed)
George Day EMERGENCY DEPARTMENT AT [facility] Provider Note   CSN: [id number] Arrival date & time: 03/03/23  1858     History  Chief Complaint  Patient presents with   Abdominal Pain    George Day is a 47 y.o. male here presenting with left flank pain.  Patient states that he had acute onset of left flank pain.  Patient has worsening CKD and his doctor is considering dialysis.  He does not have any dialysis access however.  Patient states that he rarely urinates now.  Patient states that he felt nauseated.  Denies any history of kidney stones  The history is provided by the patient.       Home Medications Prior to Admission medications   Medication Sig Start Date End Date Taking? Authorizing Provider  amLODipine (NORVASC) 10 MG tablet Take 1 tablet (10 mg total) by mouth daily. 11/21/22   Ngetich, Dinah C, NP  aspirin EC 81 MG tablet Take 1 tablet (81 mg total) by mouth daily. 11/21/22   Ngetich, Dinah C, NP  atorvastatin (LIPITOR) 40 MG tablet Take 1 tablet (40 mg total) by mouth daily. 11/21/22   Ngetich, Dinah C, NP  carvedilol (COREG) 25 MG tablet Take 2 tablets (50 mg total) by mouth 2 (two) times daily. 11/21/22   Ngetich, Dinah C, NP  famotidine (PEPCID) 20 MG tablet Take 1 tablet (20 mg total) by mouth daily. 11/21/22   Ngetich, Dinah C, NP  Fluticasone-Umeclidin-Vilant (TRELEGY ELLIPTA) 100-62.5-25 MCG/ACT AEPB Inhale 1 puff into the lungs daily. 11/21/22   Ngetich, Dinah C, NP  isosorbide-hydrALAZINE (BIDIL) 20-37.5 MG tablet Take 1 tablet by mouth 3 (three) times daily. 02/15/23 03/17/23  Arrien, York Ram, MD  nitroGLYCERIN (NITROSTAT) 0.6 MG SL tablet Place 1 tablet (0.6 mg total) under the tongue every 5 (five) minutes as needed for chest pain. 11/21/22   Ngetich, Dinah C, NP  potassium chloride SA (KLOR-CON M) 20 MEQ tablet Take 1 tablet (20 mEq total) by mouth daily. 02/15/23   Arrien, York Ram, MD  torsemide (DEMADEX) 20 MG tablet Take 2 tablets (40 mg  total) by mouth daily. 02/15/23 03/17/23  Arrien, York Ram, MD      Allergies    Honey bee venom [bee venom], Penicillins, and Tomato    Review of Systems   Review of Systems  Gastrointestinal:  Positive for abdominal pain.  All other systems reviewed and are negative.   Physical Exam Updated Vital Signs BP (!) 157/90 (BP Location: Right Arm)   Pulse 81   Temp 98.5 F (36.9 C) (Oral)   Resp 18   SpO2 98%  Physical Exam Vitals and nursing note reviewed.  Constitutional:      Comments: Chronically ill-appearing and uncomfortable  HENT:     Head: Normocephalic.     Mouth/Throat:     Mouth: Mucous membranes are moist.  Eyes:     Extraocular Movements: Extraocular movements intact.     Pupils: Pupils are equal, round, and reactive to light.  Cardiovascular:     Rate and Rhythm: Normal rate and regular rhythm.     Heart sounds: Normal heart sounds.  Pulmonary:     Effort: Pulmonary effort is normal.     Breath sounds: Normal breath sounds.  Abdominal:     General: Abdomen is flat.     Comments: Mild left CVA tenderness  Skin:    General: Skin is warm.     Capillary Refill: Capillary refill takes less than 2  seconds.  Neurological:     General: No focal deficit present.     Mental Status: He is oriented to person, place, and time.  Psychiatric:        Mood and Affect: Mood normal.        Behavior: Behavior normal.     ED Results / Procedures / Treatments   Labs (all labs ordered are listed, but only abnormal results are displayed) Labs Reviewed  CBC WITH DIFFERENTIAL/PLATELET - Abnormal; Notable for the following components:      Result Value   RBC 4.19 (*)    Hemoglobin 9.3 (*)    HCT 29.1 (*)    MCV 69.5 (*)    MCH 22.2 (*)    RDW 17.5 (*)    All other components within normal limits  COMPREHENSIVE METABOLIC PANEL - Abnormal; Notable for the following components:   Potassium 3.4 (*)    Glucose, Bld 109 (*)    BUN 54 (*)    Creatinine, Ser 5.69 (*)     Calcium 8.7 (*)    Albumin 3.1 (*)    GFR, Estimated 12 (*)    All other components within normal limits  URINALYSIS, ROUTINE W REFLEX MICROSCOPIC    EKG None  Radiology CT Renal Stone Study  Result Date: 03/03/2023 CLINICAL DATA:  Flank pain EXAM: CT ABDOMEN AND PELVIS WITHOUT CONTRAST TECHNIQUE: Multidetector CT imaging of the abdomen and pelvis was performed following the standard protocol without IV contrast. RADIATION DOSE REDUCTION: This exam was performed according to the departmental dose-optimization program which includes automated exposure control, adjustment of the mA and/or kV according to patient size and/or use of iterative reconstruction technique. COMPARISON:  CT 08/16/2022, MRI 10/28/2022 FINDINGS: Lower chest: Lung bases demonstrate no acute airspace disease. Cardiomegaly with trace pericardial effusion. Hepatobiliary: No focal liver abnormality is seen. No gallstones, gallbladder wall thickening, or biliary dilatation. Pancreas: Unremarkable. No pancreatic ductal dilatation or surrounding inflammatory changes. Spleen: Normal in size without focal abnormality. Adrenals/Urinary Tract: Stable normal left adrenal gland. Small exophytic hypodensity midpole left kidney, likely a cyst, no imaging follow-up is recommended. Hypodense right adrenal mass with peripheral calcifications, this measures 8 x 4.6 cm, previously 8.2 by 4.8 cm by CT. There is no hydronephrosis or ureteral stone. The bladder is unremarkable Stomach/Bowel: Stomach nonenlarged. No dilated small bowel. Negative appendix. Vascular/Lymphatic: Mild aortic atherosclerosis. No aneurysm. No suspicious lymph nodes. Reproductive: Prostate is unremarkable. Other: Negative for pelvic effusion or free air. Musculoskeletal: No acute or suspicious osseous abnormality IMPRESSION: 1. Negative for hydronephrosis or ureteral stone. 2. Cardiomegaly with trace pericardial effusion. 3. 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 Aortic atherosclerosis. Aortic Atherosclerosis (ICD10-I70.0). Electronically Signed   By: Jasmine Pang M.D.   On: 03/03/2023 19:31    Procedures Procedures    Medications Ordered in ED Medications  morphine (PF) 4 MG/ML injection 4 mg (4 mg Intravenous Given 03/03/23 1928)  ondansetron (ZOFRAN) injection 4 mg (4 mg Intravenous Given 03/03/23 1927)    ED Course/ Medical Decision Making/ A&P                             Medical Decision Making George Day is a 47 y.o. male here presenting with left flank pain.  He has baseline CKD.  I wonder if he has worsening CKD versus renal colic.  Plan  to get CBC and CMP and CT renal stone and urinalysis.  8:57 PM Reviewed patient's labs and creatinine went up to 5.7.  CT renal stone showed no hydronephrosis.  Discussed case with Dr. Arlean Hopping from nephrology.  He recommends admission and consult nephrology in the morning.  Patient's symptoms likely from uremia and patient may need to be started on dialysis  Problems Addressed: Chronic kidney disease, unspecified CKD stage: acute illness or injury Uremia: acute illness or injury  Amount and/or Complexity of Data Reviewed Labs: ordered. Decision-making details documented in ED Course. Radiology: ordered and independent interpretation performed. Decision-making details documented in ED Course.  Risk Prescription drug management.    Final Clinical Impression(s) / ED Diagnoses Final diagnoses:  None    Rx / DC Orders ED Discharge Orders     None         Charlynne Pander, MD 03/03/23 2102

## 2023-03-03 NOTE — H&P (Signed)
Date: 03/04/2023               Patient Name:  George Day MRN: [id number]  DOB: 06/27/1976 Age / Sex: 47 y.o., male   PCP: [name], MD         Medical Service: Internal Medicine Teaching Service         Attending Physician: Dr. Dickie La, MD    First Contact: Jannette Spanner, MS IV Pager: [phone]  Second Contact: Elza Rafter, DO Pager: [phone]       After Hours (After 5p/  First Contact Pager: [phone]  weekends / holidays): Second Contact Pager: [phone]   Chief Complaint: Abdominal pain  History of Present Illness:  George Day is a 47 y.o. 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 The pain is gripping intermittently but worsening over time. His chest pain has been present for months and improves when he leans forward. He has never had he pain in the abdomen present like this before. He is fatigued, nauseous (no vomiting), has a decreasing appetite in the last few months, and does have a metallic taste in the mouth. He reports his urine output has acutely decreased since last admission. Denies any recent fevers, diarrhea, cough, or SOB.   ED course:   He presented with flank pain that was worsening from baseline. Assessment was CKD vs renal colic. A CBC, CMP, UA and CT renal stone were ordered. The CT was negative for any hydronephrosis or ureteral stone. Did show a pericardial effusion and known adrenal mass (hemorrhagic adrenal cyst). BUN was 51 Cr 5.60 K 3.0 PO4 to be ordered Ca 8.1 WBC WNL H.H 8.8/27.8  Meds:  Amlodipine 10 mg daily ASA 81 mg daily Atorvastatin 40 mg daily Carvedilol 50 mg BID Famotidine 20 mg daily Trelegy ellipta 1 puff daily Isosorbide-hydralazine 20-37.5 mg TID Potassium Chloride 20 mEq daily Torsemide 40 mg daily  Allergies: Allergies as of  03/03/2023 - Review Complete 03/03/2023  Allergen Reaction Noted   Honey bee venom [bee venom] Anaphylaxis 08/17/2022   Penicillins Anaphylaxis 01/05/2020   Tomato Swelling 08/08/2020   Past Medical History:  Diagnosis Date   (HFpEF) heart failure with preserved ejection fraction (HCC)    Anemia 1998   Anginal pain (HCC)    Anxiety    Asthma    CKD (chronic kidney disease) stage 5, GFR less than 15 ml/min (HCC)    Coronary artery disease    Diabetes mellitus without complication (HCC) 1995   Hypertension 2019   MI (myocardial infarction) (HCC) 2010   Multiple sclerosis (HCC)    Sleep apnea    Stroke (HCC) 2010   Past Surgical History:  Procedure Laterality Date   BIOPSY  08/18/2022   Procedure: BIOPSY;  Surgeon: Imogene Burn, MD;  Location: [facility] ENDOSCOPY;  Service: Gastroenterology;;   CARDIAC CATHETERIZATION  2010   COLONOSCOPY WITH PROPOFOL N/A 08/18/2022   Procedure: COLONOSCOPY WITH PROPOFOL;  Surgeon: Imogene Burn, MD;  Location: [facility] ENDOSCOPY;  Service: Gastroenterology;  Laterality: N/A;   ESOPHAGOGASTRODUODENOSCOPY (EGD) WITH PROPOFOL N/A 08/18/2022   Procedure: ESOPHAGOGASTRODUODENOSCOPY (EGD) WITH PROPOFOL;  Surgeon: Imogene Burn, MD;  Location: [facility] ENDOSCOPY;  Service: Gastroenterology;  Laterality: N/A;   POLYPECTOMY  08/18/2022   Procedure: POLYPECTOMY;  Surgeon: Norwood Levo  C, MD;  Location: [facility] ENDOSCOPY;  Service: Gastroenterology;;    Family History:   Family History  Problem Relation Age of Onset   Diabetes Mother    Hyperlipidemia Mother    Kidney disease Mother    Drug abuse Father    Diabetes Sister    Hyperlipidemia Sister    Hearing loss Sister    Diabetes Brother    Hyperlipidemia Brother     Social History:   Lives at home with wife and children. Is independent in all ADLs and ILDs.  Review of Systems: A complete ROS was negative except as per HPI.   Physical Exam: Blood pressure (!) 148/81, pulse 78, temperature 98.2 F (36.8 C),  temperature source Oral, resp. rate 17, weight 92 kg, SpO2 100%. AAO x3  Pleasant, in no acute distress  Holosystolic murmur, S1, S2. Frictional Rub present. No gallops.  Breath sounds clear to auscultation.  Abdominal sounds normal, tender to palpation (generalized), CVA tenderness bilaterally.  No focal tenderness over spine.  No pedal edema.  No asterixis.  EKG: personally reviewed my interpretation is left ventricular enlargement. Normal sinus rhythm. No significant change from the one in June.  CT renal: No ureteral stone, no hydronephrosis. Adrenal mass (suspected hemorrhagic cyst) 8.2 cm noted on MRI (not significantly changed).  Assessment & Plan by Problem: Principal Problem:   Chronic kidney disease (CKD), stage V (HCC)  CKD stage 5 Concern for progression of CKD with more urgently-impending need for HD. He has uremic symptoms that have progressed, acute drop in UOP, and end-stage renal disease with prior discussions surrounding upcoming need to start HD. He does have a history of pericardial effusion thought to be more chronic in nature and possible related to uremia. Nephrology curbsided by EDP who states no urgent HD needs at this time. -Consult nephrology in AM  HTN Home regimen includes amlodipine 10 mg daily, carvedilol 25 mg BID, isosorbide-hydralazine 20-37.5 mg TID, torsemide 40 mg daily. -Continue home regimen  Hx asthma No respiratory symptoms at this time. Home regimen is trelegy ellipta. -Continue home regimen equivalent (Breo Ellipta AND Incruse Ellipta) 1 puff each   Anemia of chronic disease Suspect 2/2 CKD. Has had colonoscopy as recent is 07/2022 with recommendation for 10 year follow up. -Trend CBC -Iron studies, ferritin in AM  Hx MI s/p cardiac catheterization in 2010 Hx CVA CAD No residual deficits. On ASA 81 mg daily, atorvastatin 40 mg daily. Most recent lipid panel 3 months ago with LDL just above goal at 74. -Continue home regimen  Hx  T2DM Last HbA1c 3 months ago of 6.0%. Not on outpatient therapy for this.  Multiple sclerosis Recent diagnosis. Has not been able to get in with neurology yet.  HFpEF Echocardiogram 01/2023 showed LV EF 50-55% with severe concentric LVH, moderately dilated LA. Clinically euvolemic. Has had decreased UOP since discharge from last admission, concerning in setting of CKD stage 5 for risk of volume overload. Outpatient regimen includes torsemide 40 mg daily.  -Continue torsemide 40 mg daily  Right Adrenal Mass Noted on CT, stable, previously referred to endocrinology for surgical removal.  -Continue to recommend outpatient follow up with Endocrinology  CODE STATUS: Full code Antibiotics: None DVT prophylaxis: Heparin Diet: Renal diet w/ 2L fluid restriction Dispo: Admit patient to Inpatient with expected length of stay greater than 2 midnights.  SignedManuela Neptune, MD 03/04/2023, 5:24 AM  After 5pm on weekdays and 1pm on weekends: On Call pager: [phone]

## 2023-03-04 ENCOUNTER — Encounter (HOSPITAL_COMMUNITY): Payer: Self-pay | Admitting: Internal Medicine

## 2023-03-04 ENCOUNTER — Ambulatory Visit (HOSPITAL_COMMUNITY): Payer: 59

## 2023-03-04 DIAGNOSIS — Z8673 Personal history of transient ischemic attack (TIA), and cerebral infarction without residual deficits: Secondary | ICD-10-CM

## 2023-03-04 DIAGNOSIS — D509 Iron deficiency anemia, unspecified: Secondary | ICD-10-CM | POA: Diagnosis not present

## 2023-03-04 DIAGNOSIS — I130 Hypertensive heart and chronic kidney disease with heart failure and stage 1 through stage 4 chronic kidney disease, or unspecified chronic kidney disease: Secondary | ICD-10-CM | POA: Diagnosis not present

## 2023-03-04 DIAGNOSIS — G35 Multiple sclerosis: Secondary | ICD-10-CM | POA: Insufficient documentation

## 2023-03-04 DIAGNOSIS — I5030 Unspecified diastolic (congestive) heart failure: Secondary | ICD-10-CM | POA: Insufficient documentation

## 2023-03-04 DIAGNOSIS — N185 Chronic kidney disease, stage 5: Secondary | ICD-10-CM

## 2023-03-04 DIAGNOSIS — Z87891 Personal history of nicotine dependence: Secondary | ICD-10-CM

## 2023-03-04 DIAGNOSIS — I2510 Atherosclerotic heart disease of native coronary artery without angina pectoris: Secondary | ICD-10-CM

## 2023-03-04 LAB — CBC
HCT: 27.8 % — ABNORMAL LOW (ref 39.0–52.0)
Hemoglobin: 8.8 g/dL — ABNORMAL LOW (ref 13.0–17.0)
MCH: 21.9 pg — ABNORMAL LOW (ref 26.0–34.0)
MCHC: 31.7 g/dL (ref 30.0–36.0)
MCV: 69.3 fL — ABNORMAL LOW (ref 80.0–100.0)
Platelets: 225 10*3/uL (ref 150–400)
RBC: 4.01 MIL/uL — ABNORMAL LOW (ref 4.22–5.81)
RDW: 17.3 % — ABNORMAL HIGH (ref 11.5–15.5)
WBC: 6 10*3/uL (ref 4.0–10.5)
nRBC: 0 % (ref 0.0–0.2)

## 2023-03-04 LAB — RENAL FUNCTION PANEL
Albumin: 2.9 g/dL — ABNORMAL LOW (ref 3.5–5.0)
Anion gap: 8 (ref 5–15)
BUN: 51 mg/dL — ABNORMAL HIGH (ref 6–20)
CO2: 22 mmol/L (ref 22–32)
Calcium: 8.1 mg/dL — ABNORMAL LOW (ref 8.9–10.3)
Chloride: 106 mmol/L (ref 98–111)
Creatinine, Ser: 5.6 mg/dL — ABNORMAL HIGH (ref 0.61–1.24)
GFR, Estimated: 12 mL/min — ABNORMAL LOW (ref >60)
Glucose, Bld: 127 mg/dL — ABNORMAL HIGH (ref 70–99)
Phosphorus: 4.2 mg/dL (ref 2.5–4.6)
Potassium: 3 mmol/L — ABNORMAL LOW (ref 3.5–5.1)
Sodium: 136 mmol/L (ref 135–145)

## 2023-03-04 LAB — IRON AND TIBC
Iron: 29 ug/dL — ABNORMAL LOW (ref 45–182)
Saturation Ratios: 8 % — ABNORMAL LOW (ref 17.9–39.5)
TIBC: 344 ug/dL (ref 250–450)
UIBC: 315 ug/dL

## 2023-03-04 LAB — HEPATITIS B SURFACE ANTIGEN: Hepatitis B Surface Ag: NONREACTIVE

## 2023-03-04 LAB — FERRITIN: Ferritin: 21 ng/mL — ABNORMAL LOW (ref 24–336)

## 2023-03-04 MED ORDER — POTASSIUM CHLORIDE 10 MEQ/100ML IV SOLN
10.0000 meq | INTRAVENOUS | Status: AC
  Administered 2023-03-04 (×5): 10 meq via INTRAVENOUS
  Filled 2023-03-04 (×5): qty 100

## 2023-03-04 MED ORDER — ACETAMINOPHEN 500 MG PO TABS: 1000.0000 mg | ORAL_TABLET | Freq: Three times a day (TID) | ORAL | Status: DC | PRN

## 2023-03-04 MED ORDER — HYDROMORPHONE HCL 2 MG PO TABS
2.0000 mg | ORAL_TABLET | Freq: Four times a day (QID) | ORAL | Status: DC | PRN
  Administered 2023-03-05: 2 mg via ORAL
  Filled 2023-03-04: qty 1

## 2023-03-04 MED ORDER — SODIUM CHLORIDE 0.9 % IV SOLN
510.0000 mg | Freq: Once | INTRAVENOUS | Status: AC
  Administered 2023-03-04: 510 mg via INTRAVENOUS
  Filled 2023-03-04: qty 17

## 2023-03-04 NOTE — Consult Note (Signed)
George Day Admit Date: 03/03/2023 03/04/2023 Arita Miss Requesting Physician:  Sol Blazing MD  Reason for Consult:  Uremia, CKD5  HPI:  7M PMH including severe hypertension, DM2, history of PE, history of adrenal mass, CAD, history of CVA, question multiple sclerosis, admitted overnight after presenting to the emergency room with right-sided flank and abdominal pain related to deep inspiration with accompanying fatigue, nausea/anorexia, dysgeusia.  ED workup with included CT of the abdomen and pelvis was which was negative for hydronephrosis but demonstrated cardiomegaly with trace pericardial effusion and a stable right adrenal mass.  He has known CKD 5.  He has not been seen at [facility] since 2022.  He was seen in the hospital in January of this year with recognition of advancing CKD.  In the ED overnight his creatinine was 5.6, BUN 51, K3.0, bicarbonate 22.  Hemoglobin 8.8 with microcytosis and evidence of iron deficiency.  He was admitted to the internal medicine service.  This morning he reconfirms progressive dysgeusia, fatigue, anorexia.  He seems aware that it is time to start dialysis and has no objection to doing so.  He lives in the [place] area.  Creat (mg/dL)  Date Value  16/05/9603 4.05 (H)   Creatinine, Ser (mg/dL)  Date Value  54/04/8118 5.60 (H)  03/03/2023 5.69 (H)  03/01/2023 4.93 (H)  02/15/2023 5.15 (H)  02/14/2023 5.19 (H)  02/14/2023 4.91 (H)  02/13/2023 4.80 (H)  02/12/2023 5.30 (H)  02/12/2023 4.79 (H)  10/31/2022 4.31 (H)  ] I/Os:  ROS Balance of 12 systems is negative w/ exceptions as above  PMH  Past Medical History:  Diagnosis Date   (HFpEF) heart failure with preserved ejection fraction (HCC)    Anemia 1998   Anginal pain (HCC)    Anxiety    Asthma    CKD (chronic kidney disease) stage 5, GFR less than 15 ml/min (HCC)    Coronary artery disease    Diabetes mellitus without complication (HCC) 1995   Hypertension 2019   MI  (myocardial infarction) (HCC) 2010   Multiple sclerosis (HCC)    Sleep apnea    Stroke (HCC) 2010   PSH  Past Surgical History:  Procedure Laterality Date   BIOPSY  08/18/2022   Procedure: BIOPSY;  Surgeon: Imogene Burn, MD;  Location: [facility] ENDOSCOPY;  Service: Gastroenterology;;   CARDIAC CATHETERIZATION  2010   COLONOSCOPY WITH PROPOFOL N/A 08/18/2022   Procedure: COLONOSCOPY WITH PROPOFOL;  Surgeon: Imogene Burn, MD;  Location: [facility] ENDOSCOPY;  Service: Gastroenterology;  Laterality: N/A;   ESOPHAGOGASTRODUODENOSCOPY (EGD) WITH PROPOFOL N/A 08/18/2022   Procedure: ESOPHAGOGASTRODUODENOSCOPY (EGD) WITH PROPOFOL;  Surgeon: Imogene Burn, MD;  Location: [facility] ENDOSCOPY;  Service: Gastroenterology;  Laterality: N/A;   POLYPECTOMY  08/18/2022   Procedure: POLYPECTOMY;  Surgeon: Imogene Burn, MD;  Location: [facility] ENDOSCOPY;  Service: Gastroenterology;;   FH  Family History  Problem Relation Age of Onset   Diabetes Mother    Hyperlipidemia Mother    Kidney disease Mother    Drug abuse Father    Diabetes Sister    Hyperlipidemia Sister    Hearing loss Sister    Diabetes Brother    Hyperlipidemia Brother    SH  reports that he quit smoking about 17 months ago. His smoking use included cigars, e-cigarettes, and cigarettes. He started smoking about 32 years ago. He has a 15.5 pack-year smoking history. He has never used smokeless tobacco. He reports that he does not currently use alcohol. He reports current  drug use. Drug: Marijuana. Allergies  Allergies  Allergen Reactions   Honey Bee Venom [Bee Venom] Anaphylaxis   Penicillins Anaphylaxis    ALL   Tomato Swelling   Home medications Prior to Admission medications   Medication Sig Start Date End Date Taking? Authorizing Provider  amLODipine (NORVASC) 10 MG tablet Take 1 tablet (10 mg total) by mouth daily. 11/21/22   Ngetich, Dinah C, NP  aspirin EC 81 MG tablet Take 1 tablet (81 mg total) by mouth daily. 11/21/22   Ngetich, Dinah C, NP   atorvastatin (LIPITOR) 40 MG tablet Take 1 tablet (40 mg total) by mouth daily. 11/21/22   Ngetich, Dinah C, NP  carvedilol (COREG) 25 MG tablet Take 2 tablets (50 mg total) by mouth 2 (two) times daily. 11/21/22   Ngetich, Dinah C, NP  famotidine (PEPCID) 20 MG tablet Take 1 tablet (20 mg total) by mouth daily. 11/21/22   Ngetich, Dinah C, NP  Fluticasone-Umeclidin-Vilant (TRELEGY ELLIPTA) 100-62.5-25 MCG/ACT AEPB Inhale 1 puff into the lungs daily. 11/21/22   Ngetich, Dinah C, NP  isosorbide-hydrALAZINE (BIDIL) 20-37.5 MG tablet Take 1 tablet by mouth 3 (three) times daily. 02/15/23 03/17/23  Arrien, York Ram, MD  nitroGLYCERIN (NITROSTAT) 0.6 MG SL tablet Place 1 tablet (0.6 mg total) under the tongue every 5 (five) minutes as needed for chest pain. 11/21/22   Ngetich, Dinah C, NP  potassium chloride SA (KLOR-CON M) 20 MEQ tablet Take 1 tablet (20 mEq total) by mouth daily. 02/15/23   Arrien, York Ram, MD  torsemide (DEMADEX) 20 MG tablet Take 2 tablets (40 mg total) by mouth daily. 02/15/23 03/17/23  Coralie Keens, MD    Current Medications Scheduled Meds:  amLODipine  10 mg Oral Daily   aspirin EC  81 mg Oral Daily   atorvastatin  40 mg Oral Daily   carvedilol  50 mg Oral BID   docusate sodium  100 mg Oral BID   famotidine  20 mg Oral Daily   fluticasone furoate-vilanterol  1 puff Inhalation Daily   And   umeclidinium bromide  1 puff Inhalation Daily   heparin  5,000 Units Subcutaneous Q8H   isosorbide-hydrALAZINE  1 tablet Oral TID   potassium chloride SA  20 mEq Oral Daily   torsemide  40 mg Oral Daily   Continuous Infusions:  ferumoxytol (FERAHEME) 510 mg in sodium chloride 0.9 % 100 mL IVPB     PRN Meds:.acetaminophen, HYDROmorphone  CBC Recent Labs  Lab 03/01/23 1005 03/03/23 1912 03/04/23 0135  WBC 6.6 6.7 6.0  NEUTROABS  --  4.9  --   HGB 9.9* 9.3* 8.8*  HCT 33.3* 29.1* 27.8*  MCV 74* 69.5* 69.3*  PLT 283 228 225   Basic Metabolic Panel Recent Labs   Lab 03/01/23 1005 03/03/23 1912 03/04/23 0135  NA 141 137 136  K 3.9 3.4* 3.0*  CL 102 105 106  CO2 23 22 22   GLUCOSE 93 109* 127*  BUN 50* 54* 51*  CREATININE 4.93* 5.69* 5.60*  CALCIUM 8.7 8.7* 8.1*  PHOS  --   --  4.2    Physical Exam  Blood pressure (!) 160/100, pulse 80, temperature 98.7 F (37.1 C), temperature source Oral, resp. rate 16, weight 92 kg, SpO2 98%. GEN: NAD, Sitting at the edge of the bed finishing lunch ENT: NCAT EYES: EOMI CV: Regular, no rub PULM: Clear bilaterally, normal work of breathing ABD: Soft, nontender SKIN: No rashes or lesions EXT: No significant edema NEURO: AAO x4,  no asterixus,   Assessment 1M with progressive CKD 5 likely due to hypertensive nephropathy now with uremia to start dialysis.  New ESRD with uremia Start iHD: HD #1 after HD cath placement CLIP, maybe to TCU IR consult for [facility]VS consult for AVF, pt aware, order vein mapping HTN, longstanding and uncontrolled with dialysis.  No immediate changes to medications Microcytic anemia with features of iron deficiency and CKD: Receiving IV iron and will need ESA and time Trace pericardial effusion likely due to ESRD, doubt hemodynamically significant. DM2 History of adrenal mass, outpatient management  Plan As above Daily weights, Daily Renal Panel, Strict I/Os, Avoid nephrotoxins (NSAIDs, judicious IV Contrast) WIll follow along  Arita Miss  03/04/2023, 1:05 PM

## 2023-03-04 NOTE — Plan of Care (Signed)

## 2023-03-04 NOTE — Progress Notes (Addendum)
HD#1 SUBJECTIVE:  Patient Summary: George Day is a 47 y.o. 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   Overnight Events: Patient reports that he slept well. He denies any chest, flank, or abdominal pain this morning. He states that his abdominal pain started yesterday morning suddenly and it woke him up. Per patient his urine amount varies. He also reports a metallic taste in mouth yesterday with hiccupping and yawning.      OBJECTIVE:  Vital Signs: Vitals:   03/03/23 2333 03/03/23 2336 03/04/23 0114 03/04/23 0601  BP: (!) 152/85  (!) 148/81 (!) 158/88  Pulse: 83  78 79  Resp: 19  17 18   Temp: 98.2 F (36.8 C)   98.3 F (36.8 C)  TempSrc: Oral   Oral  SpO2: 95%  100% 98%  Weight:  92 kg     Supplemental O2: Room Air SpO2: 98 %  Filed Weights   03/03/23 2336  Weight: 92 kg     Intake/Output Summary (Last 24 hours) at 03/04/2023 0657 Last data filed at 03/04/2023 0437 Gross per 24 hour  Intake 79.45 ml  Output 350 ml  Net -270.55 ml   Net IO Since Admission: -270.55 mL [03/04/23 0657]  Physical Exam Constitutional:      General: He is not in acute distress. HENT:     Head: Normocephalic and atraumatic.  Cardiovascular:     Rate and Rhythm: Normal rate and regular rhythm.  Pulmonary:     Effort: Pulmonary effort is normal. No respiratory distress.     Breath sounds: Normal breath sounds.  Abdominal:     General: Bowel sounds are normal. There is distension.     Palpations: Abdomen is soft.     Tenderness: There is generalized abdominal tenderness. There is right CVA tenderness and left CVA tenderness.  Skin:    General: Skin is warm and dry.  Neurological:     General: No focal deficit present.     Mental Status: He is alert and oriented to person, place, and time.  Psychiatric:        Mood  and Affect: Mood normal.        Behavior: Behavior normal.     Patient Lines/Drains/Airways Status     Active Line/Drains/Airways     Name Placement date Placement time Site Days   Peripheral IV 03/03/23 20 G Anterior;Left Forearm 03/03/23  1915  Forearm  1            Pertinent Labs:    Latest Ref Rng & Units 03/04/2023    1:35 AM 03/03/2023    7:12 PM 03/01/2023   10:05 AM  CBC  WBC 4.0 - 10.5 K/uL 6.0  6.7  6.6   Hemoglobin 13.0 - 17.0 g/dL 8.8  9.3  9.9   Hematocrit 39.0 - 52.0 % 27.8  29.1  33.3   Platelets 150 - 400 K/uL 225  228  283        Latest Ref Rng & Units 03/04/2023    1:35 AM 03/03/2023    7:12 PM 03/01/2023   10:05 AM  CMP  Glucose 70 - 99 mg/dL 161  096  93   BUN 6 - 20 mg/dL 51  54  50   Creatinine 0.61 - 1.24 mg/dL 0.45  4.09  8.11  Sodium 135 - 145 mmol/L 136  137  141   Potassium 3.5 - 5.1 mmol/L 3.0  3.4  3.9   Chloride 98 - 111 mmol/L 106  105  102   CO2 22 - 32 mmol/L 22  22  23    Calcium 8.9 - 10.3 mg/dL 8.1  8.7  8.7   Total Protein 6.5 - 8.1 g/dL  7.1    Total Bilirubin 0.3 - 1.2 mg/dL  0.9    Alkaline Phos 38 - 126 U/L  64    AST 15 - 41 U/L  23    ALT 0 - 44 U/L  22      No results for input(s): "GLUCAP" in the last 72 hours.   Pertinent Imaging: CT Renal Stone Study  Result Date: 03/03/2023 CLINICAL DATA:  Flank pain EXAM: CT ABDOMEN AND PELVIS WITHOUT CONTRAST IMPRESSION: 1. Negative for hydronephrosis or ureteral stone. 2. Cardiomegaly with trace pericardial effusion. 3. 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 Aortic atherosclerosis. Aortic Atherosclerosis (ICD10-I70.0). Electronically Signed   By: Jasmine Pang M.D.   On: 03/03/2023 19:31    ASSESSMENT/PLAN:  Assessment: Principal Problem:   Chronic kidney disease (CKD), stage V (HCC)   George Day is a 47 y.o. 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 On hospital day 1  Plan: #CKD stage 5 Concern for progression of CKD to stage 5 with GFR 12. He has uremic symptoms that have progressed including hiccups, acute drop in UOP, and end-stage renal disease with prior discussions to start HD, although BUN is only 51. Nephrology consulted to see if initiating HD is necessary for this hospitalization. If HD is not started now, could likely discharge back home.  -appreciate nephrology recs -strict I/Os to monitor urine output -fluid restriction, 2026mL/day  Hypokalemia Potassium 3.0 today. Will replace. -potassium tablet daily   HTN Home regimen includes amlodipine 10 mg daily, carvedilol 25 mg BID, isosorbide-hydralazine 20-37.5 mg TID, torsemide 40 mg daily. -Continue home meds  Iron deficiency anemia MCV has showed a microcytic anemia that seems to be chronic. Ferritin and iron is low, will replace iron. Suspect his anemia is also multifactorial due to CKD, could also be anemia of chronic disease. Has had colonoscopy as recent is 07/2022 with recommendation for 10 year follow up.  -Trend CBC -Feraheme 510mg  IV x1 dose    Hx MI s/p cardiac catheterization in 2010 Hx CVA CAD No residual deficits. On ASA 81 mg daily, atorvastatin 40 mg daily. Most recent lipid panel 3 months ago with LDL just above goal at 74. -Continue home regimen   Hx T2DM Last HbA1c 3 months ago of 6.0%. Not on outpatient therapy for this, can consider starting therapy outpatient at follow up.   HFpEF LV EF 50-55%. Clinically euvolemic. Outpatient regimen includes torsemide 40 mg daily.  -Continue torsemide 40 mg daily    Best Practice: Diet: Renal diet IVF: Fluids: none VTE: heparin injection 5,000 Units Start: 03/04/23 1400 Code: Full Therapy Recs: none Family Contact: wife, to be  notified. DISPO: Anticipated discharge in 1-3 days to Home pending  kidney function improvement or initiation of dialysis.   Signature: Gae Gallop, Medical Student   Please contact the on call pager  after 5 pm and on weekends at [phone].   Attestation for Student Documentation:  I personally was present and performed or re-performed the history, physical exam and medical decision-making activities of this service and have verified that the service and findings are accurately documented in the student's note.  Chauncey Mann, DO 03/04/2023, 12:35 PM

## 2023-03-04 NOTE — Plan of Care (Signed)
  Problem: Education: Goal: Knowledge of General Education information will improve Description: Including pain rating scale, medication(s)/side effects and non-pharmacologic comfort measures Outcome: Progressing   Problem: Clinical Measurements: Goal: Will remain free from infection Outcome: Progressing Goal: Diagnostic test results will improve Outcome: Progressing   Problem: Nutrition: Goal: Adequate nutrition will be maintained Outcome: Progressing   Problem: Coping: Goal: Level of anxiety will decrease Outcome: Progressing   Problem: Pain Managment: Goal: General experience of comfort will improve Outcome: Progressing   Problem: Safety: Goal: Ability to remain free from injury will improve Outcome: Progressing   Problem: Skin Integrity: Goal: Risk for impaired skin integrity will decrease Outcome: Progressing

## 2023-03-04 NOTE — Consult Note (Addendum)
[facility] NOTE   MRN : [id number]  Reason for Consult: ESRD Referring Physician: Dr. Marisue Humble  History of Present Illness: 47 y/o male with AKI on CKD.  We have been asked to provide permanent access.  He is right hand dominant and has no history of chest implants.     He presented to the ED with reports of decreased urine OP.  His lab work demonstrated elevated Cr and a TDC was ordered to be placed by IR.    Past medical history includes: HFpEF, CAD, suspected uremic pericardial effusion, multiple sclerosis, HTN and history of MI without intervention.     Current Facility-Administered Medications  Medication Dose Route Frequency Provider Last Rate Last Admin   acetaminophen (TYLENOL) tablet 1,000 mg  1,000 mg Oral Q8H PRN Alexander-Savino, [place], MD       amLODipine (NORVASC) tablet 10 mg  10 mg Oral Daily Champ Mungo, DO   10 mg at 03/04/23 1004   aspirin EC tablet 81 mg  81 mg Oral Daily Champ Mungo, DO   81 mg at 03/04/23 1003   atorvastatin (LIPITOR) tablet 40 mg  40 mg Oral Daily Champ Mungo, DO   40 mg at 03/04/23 1003   carvedilol (COREG) tablet 50 mg  50 mg Oral BID Champ Mungo, DO   50 mg at 03/04/23 1003   docusate sodium (COLACE) capsule 100 mg  100 mg Oral BID Champ Mungo, DO   100 mg at 03/04/23 1004   famotidine (PEPCID) tablet 20 mg  20 mg Oral Daily Champ Mungo, DO   20 mg at 03/04/23 1004   fluticasone furoate-vilanterol (BREO ELLIPTA) 100-25 MCG/ACT 1 puff  1 puff Inhalation Daily Champ Mungo, DO   1 puff at 03/04/23 0805   And   umeclidinium bromide (INCRUSE ELLIPTA) 62.5 MCG/ACT 1 puff  1 puff Inhalation Daily Champ Mungo, DO   1 puff at 03/04/23 0805   heparin injection 5,000 Units  5,000 Units Subcutaneous Q8H Champ Mungo, DO   5,000 Units at 03/04/23 1307   HYDROmorphone (DILAUDID) tablet 2 mg  2 mg Oral Q6H PRN Alexander-Savino, [place], MD       isosorbide-hydrALAZINE (BIDIL) 20-37.5 MG per tablet 1 tablet  1 tablet Oral  TID Champ Mungo, DO   1 tablet at 03/04/23 1003   potassium chloride SA (KLOR-CON M) CR tablet 20 mEq  20 mEq Oral Daily Champ Mungo, DO   20 mEq at 03/04/23 1003   torsemide (DEMADEX) tablet 40 mg  40 mg Oral Daily Champ Mungo, DO   40 mg at 03/04/23 1003    Pt meds include: Statin :Yes Betablocker: Yes ASA: Yes Other anticoagulants/antiplatelets: None  Past Medical History:  Diagnosis Date   (HFpEF) heart failure with preserved ejection fraction (HCC)    Anemia 1998   Anginal pain (HCC)    Anxiety    Asthma    CKD (chronic kidney disease) stage 5, GFR less than 15 ml/min (HCC)    Coronary artery disease    Diabetes mellitus without complication (HCC) 1995   Hypertension 2019   MI (myocardial infarction) (HCC) 2010   Multiple sclerosis (HCC)    Sleep apnea    Stroke (HCC) 2010    Past Surgical History:  Procedure Laterality Date   BIOPSY  08/18/2022   Procedure: BIOPSY;  Surgeon: Imogene Burn, MD;  Location: [facility] ENDOSCOPY;  Service: Gastroenterology;;   CARDIAC CATHETERIZATION  2010   COLONOSCOPY WITH PROPOFOL N/A 08/18/2022  Procedure: COLONOSCOPY WITH PROPOFOL;  Surgeon: Imogene Burn, MD;  Location: [facility] ENDOSCOPY;  Service: Gastroenterology;  Laterality: N/A;   ESOPHAGOGASTRODUODENOSCOPY (EGD) WITH PROPOFOL N/A 08/18/2022   Procedure: ESOPHAGOGASTRODUODENOSCOPY (EGD) WITH PROPOFOL;  Surgeon: Imogene Burn, MD;  Location: [facility] ENDOSCOPY;  Service: Gastroenterology;  Laterality: N/A;   POLYPECTOMY  08/18/2022   Procedure: POLYPECTOMY;  Surgeon: Imogene Burn, MD;  Location: [facility] ENDOSCOPY;  Service: Gastroenterology;;    Social History Social History   Tobacco Use   Smoking status: Former    Current packs/day: 0.00    Average packs/day: 0.5 packs/day for 31.0 years (15.5 ttl pk-yrs)    Types: Cigars, E-cigarettes, Cigarettes    Start date: 09/1990    Quit date: 09/2021    Years since quitting: 1.4   Smokeless tobacco: Never  Vaping Use   Vaping status: Never  Used  Substance Use Topics   Alcohol use: Not Currently    Comment: occasioanlly    Drug use: Yes    Types: Marijuana    Comment: 2 x week    Family History Family History  Problem Relation Age of Onset   Diabetes Mother    Hyperlipidemia Mother    Kidney disease Mother    Drug abuse Father    Diabetes Sister    Hyperlipidemia Sister    Hearing loss Sister    Diabetes Brother    Hyperlipidemia Brother     Allergies  Allergen Reactions   Honey Bee Venom [Bee Venom] Anaphylaxis   Penicillins Anaphylaxis    ALL   Tomato Swelling     REVIEW OF SYSTEMS  General: [ ]  Weight loss, [ ]  Fever, [ ]  chills Neurologic: [ ]  Dizziness, [ ]  Blackouts, [ ]  Seizure [ ]  Stroke, [ ]  "Mini stroke", [ ]  Slurred speech, [ ]  Temporary blindness; [ ]  weakness in arms or legs, [ ]  Hoarseness [ ]  Dysphagia Cardiac: [ ]  Chest pain/pressure, [ ]  Shortness of breath at rest [ ]  Shortness of breath with exertion, [ ]  Atrial fibrillation or irregular heartbeat  Vascular: [ ]  Pain in legs with walking, [ ]  Pain in legs at rest, [ ]  Pain in legs at night,  [ ]  Non-healing ulcer, [ ]  Blood clot in vein/DVT,   Pulmonary: [ ]  Home oxygen, [ ]  Productive cough, [ ]  Coughing up blood, [ ]  Asthma,  [ ]  Wheezing [ ]  COPD Musculoskeletal:  [ ]  Arthritis, [ ]  Low back pain, [ ]  Joint pain Hematologic: [ ]  Easy Bruising, [ ]  Anemia; [ ]  Hepatitis Gastrointestinal: [ ]  Blood in stool, [ ]  Gastroesophageal Reflux/heartburn, Urinary: [ x] chronic Kidney disease, [ ]  on HD - [ ]  MWF or [ ]  TTHS, [ ]  Burning with urination, [ ]  Difficulty urinating Skin: [ ]  Rashes, [ ]  Wounds Psychological: [ ]  Anxiety, [ ]  Depression  Physical Examination Vitals:   03/04/23 0601 03/04/23 0743 03/04/23 0750 03/04/23 0805  BP: (!) 158/88 (!) 164/100 (!) 160/100   Pulse: 79 71 80   Resp: 18 16 16    Temp: 98.3 F (36.8 C) 97.9 F (36.6 C) 98.7 F (37.1 C)   TempSrc: Oral Oral Oral   SpO2: 98% 99% 100% 98%  Weight:        Body mass index is 29.95 kg/m.  General:  WDWN in NAD Gait: Normal HENT: WNL Eyes: Pupils equal Pulmonary: normal non-labored breathing , without Rales, rhonchi,  wheezing Cardiac: RRR, with Murmur, rubs or gallops; No carotid bruits Abdomen:  soft, NT, no masses Skin: no rashes, ulcers noted;  no Gangrene , no cellulitis; no open wounds;   Vascular Exam/Pulses:Palpable radial pulses B UE   Musculoskeletal: no muscle wasting or atrophy Neurologic: A&O X 3; Appropriate Affect ;  SENSATION: normal; MOTOR FUNCTION: 5/5 Symmetric Speech is fluent/normal   Significant Diagnostic Studies: CBC Lab Results  Component Value Date   WBC 6.0 03/04/2023   HGB 8.8 (L) 03/04/2023   HCT 27.8 (L) 03/04/2023   MCV 69.3 (L) 03/04/2023   PLT 225 03/04/2023    BMET    Component Value Date/Time   NA 136 03/04/2023 0135   NA 141 03/01/2023 1005   K 3.0 (L) 03/04/2023 0135   CL 106 03/04/2023 0135   CO2 22 03/04/2023 0135   GLUCOSE 127 (H) 03/04/2023 0135   BUN 51 (H) 03/04/2023 0135   BUN 50 (H) 03/01/2023 1005   CREATININE 5.60 (H) 03/04/2023 0135   CREATININE 4.05 (H) 11/22/2022 0811   CALCIUM 8.1 (L) 03/04/2023 0135   GFRNONAA 12 (L) 03/04/2023 0135   GFRAA 42 (L) 02/25/2020 1507   Estimated Creatinine Clearance: 18.3 mL/min (A) (by C-G formula based on SCr of 5.6 mg/dL (H)).  COAG Lab Results  Component Value Date   INR 1.0 02/12/2023   INR 1.1 08/16/2022   INR 1.0 02/19/2021     Non-Invasive Vascular Imaging:  Vein mapping ordered   ASSESSMENT/PLAN:   ESRD due to HTN  IR plans to place [facility] today He is right hand dominant and we will plan to place permanent access fistula verse graft in the left UE pending vein mapping results.    He has a good understanding of HD after his Mother went through it.  Pending OR schedule.     Mosetta Pigeon 03/04/2023 2:03 PM   I have interviewed and examined patient with PA and agree with assessment and plan above.   [facility] tomorrow with interventional radiology and we will follow-up vein mapping for definitive access planning.  Josie Burleigh C. Randie Heinz, MD [facility] [place] Office: [phone] Pager: [phone]

## 2023-03-04 NOTE — Progress Notes (Signed)
New Admission Note:    Arrival Method: via stretcher via EMS Mental Orientation: A&O x4  Telemetry: N/a  Assessment: Completed Skin: Intact, no skin issues present  IV:  L. FA #20 Pain: 8/10 (Bilateral flank and abdomen)  Tubes:  None  Safety Measures: Safety Fall Prevention Plan has been given, discussed and signed Admission: Completed 5 MW Orientation: Patient has been orientated to the room, unit and staff.  Family:  Wife notified via pt    Patient brought his cell phone ,  clothing and a necklace.  He was advised of the [employer].   Orders have been reviewed and implemented. Will continue to monitor the patient. Call light has been placed within reach and bed alarm has been activated.    Tracie Harrier RN Phone number: [phone]

## 2023-03-04 NOTE — TOC CM/SW Note (Addendum)
  Transition of Care [facility]) - Inpatient Brief Assessment   Patient Details  Name: George Day MRN: [id number] Date of Birth: [birth year]  Transition of Care [facility]) CM/SW Contact:    Tom-Johnson, Hershal Coria, RN Phone Number: 03/04/2023, 3:24 PM   Clinical Narrative:  Patient presented to the ED with Abdominal and Bilateral Flank pain with decreased urine output .  Found to have worsening Kidney function. Has Hx of CKD V, Patient to start Dialysis. Nephrology following. Vascular consult for Vein Mapping/Permanent access. Renal Navigator following for outpatient Dialysis clipping. Patient states he has Medicaid and will call to schedule for outpatient dialysis when scheduled.  Has Hx of MS, on Disability.   Patient is from home with his wife and five out of six children. States he recent ly moved back from [place] to [place] in December. Independent with care and drive self prior to admission. Was recently homeless but now lives in an apartment.  Has a CPAP machine at home.  PCP is [name], MD and uses [facility].    No recommendations noted at this time. CM will continue to follow as patient progresses with care towards discharge.    Transition of Care Asessment: Insurance and Status: Insurance coverage has been reviewed Patient has primary care physician: Yes Home environment has been reviewed: yes Prior level of function:: Independent Prior/Current Home Services: No current home services Social Determinants of Health Reivew: SDOH reviewed no interventions necessary (Patient has Medicaid, will call to schedule transportation to and from outpatient dialysis.) Readmission risk has been reviewed: Yes Transition of care needs: transition of care needs identified, TOC will continue to follow

## 2023-03-04 NOTE — Progress Notes (Signed)
Heart Failure Navigator Progress Note  Assessed for Heart & Vascular TOC clinic readiness.  Patient prior appointment for Hf TOC was cancelled due to patient being hospitalized. Will not reschedule appointment due to patient now ESRD starting hemodialysis. .   Navigator will sign off at this time.   Rhae Hammock, BSN, Scientist, clinical (histocompatibility and immunogenetics) Only

## 2023-03-05 ENCOUNTER — Encounter (HOSPITAL_COMMUNITY): Payer: Self-pay | Admitting: Internal Medicine

## 2023-03-05 ENCOUNTER — Inpatient Hospital Stay (HOSPITAL_COMMUNITY): Payer: 59

## 2023-03-05 DIAGNOSIS — N186 End stage renal disease: Secondary | ICD-10-CM

## 2023-03-05 DIAGNOSIS — I129 Hypertensive chronic kidney disease with stage 1 through stage 4 chronic kidney disease, or unspecified chronic kidney disease: Secondary | ICD-10-CM

## 2023-03-05 DIAGNOSIS — E876 Hypokalemia: Secondary | ICD-10-CM

## 2023-03-05 HISTORY — PX: IR US GUIDE VASC ACCESS RIGHT: IMG2390

## 2023-03-05 HISTORY — PX: IR FLUORO GUIDE CV LINE RIGHT: IMG2283

## 2023-03-05 LAB — CBC
HCT: 27.5 % — ABNORMAL LOW (ref 39.0–52.0)
Hemoglobin: 9 g/dL — ABNORMAL LOW (ref 13.0–17.0)
MCH: 22.6 pg — ABNORMAL LOW (ref 26.0–34.0)
MCHC: 32.7 g/dL (ref 30.0–36.0)
MCV: 69.1 fL — ABNORMAL LOW (ref 80.0–100.0)
Platelets: 200 10*3/uL (ref 150–400)
RBC: 3.98 MIL/uL — ABNORMAL LOW (ref 4.22–5.81)
RDW: 17.2 % — ABNORMAL HIGH (ref 11.5–15.5)
WBC: 5.2 10*3/uL (ref 4.0–10.5)
nRBC: 0 % (ref 0.0–0.2)

## 2023-03-05 LAB — RENAL FUNCTION PANEL
Albumin: 2.9 g/dL — ABNORMAL LOW (ref 3.5–5.0)
Anion gap: 7 (ref 5–15)
BUN: 54 mg/dL — ABNORMAL HIGH (ref 6–20)
CO2: 22 mmol/L (ref 22–32)
Calcium: 8.3 mg/dL — ABNORMAL LOW (ref 8.9–10.3)
Chloride: 105 mmol/L (ref 98–111)
Creatinine, Ser: 5.97 mg/dL — ABNORMAL HIGH (ref 0.61–1.24)
GFR, Estimated: 11 mL/min — ABNORMAL LOW (ref >60)
Glucose, Bld: 100 mg/dL — ABNORMAL HIGH (ref 70–99)
Phosphorus: 4.5 mg/dL (ref 2.5–4.6)
Potassium: 3.4 mmol/L — ABNORMAL LOW (ref 3.5–5.1)
Sodium: 134 mmol/L — ABNORMAL LOW (ref 135–145)

## 2023-03-05 MED ORDER — VANCOMYCIN HCL IN DEXTROSE 1-5 GM/200ML-% IV SOLN: 1000.0000 mg | INTRAVENOUS | Status: AC

## 2023-03-05 MED ORDER — MIDAZOLAM HCL 2 MG/2ML IJ SOLN
INTRAMUSCULAR | Status: AC
  Filled 2023-03-05: qty 2

## 2023-03-05 MED ORDER — LIDOCAINE-EPINEPHRINE 1 %-1:100000 IJ SOLN
10.0000 mL | Freq: Once | INTRAMUSCULAR | Status: AC
  Administered 2023-03-05: 10 mL
  Filled 2023-03-05: qty 10

## 2023-03-05 MED ORDER — FENTANYL CITRATE (PF) 100 MCG/2ML IJ SOLN
INTRAMUSCULAR | Status: AC | PRN
  Administered 2023-03-05: 50 ug via INTRAVENOUS

## 2023-03-05 MED ORDER — VANCOMYCIN HCL IN DEXTROSE 1-5 GM/200ML-% IV SOLN
INTRAVENOUS | Status: AC
  Filled 2023-03-05: qty 200

## 2023-03-05 MED ORDER — FENTANYL CITRATE (PF) 100 MCG/2ML IJ SOLN
INTRAMUSCULAR | Status: AC
  Filled 2023-03-05: qty 2

## 2023-03-05 MED ORDER — CHLORHEXIDINE GLUCONATE CLOTH 2 % EX PADS
6.0000 | MEDICATED_PAD | Freq: Every day | CUTANEOUS | Status: DC
  Administered 2023-03-05 – 2023-03-07 (×4): 6 via TOPICAL

## 2023-03-05 MED ORDER — DARBEPOETIN ALFA 100 MCG/0.5ML IJ SOSY
100.0000 ug | PREFILLED_SYRINGE | INTRAMUSCULAR | Status: DC
  Administered 2023-03-05: 100 ug via SUBCUTANEOUS
  Filled 2023-03-05: qty 0.5

## 2023-03-05 MED ORDER — MIDAZOLAM HCL 2 MG/2ML IJ SOLN
INTRAMUSCULAR | Status: AC | PRN
  Administered 2023-03-05: 1 mg via INTRAVENOUS

## 2023-03-05 MED ORDER — VANCOMYCIN HCL IN DEXTROSE 1-5 GM/200ML-% IV SOLN
INTRAVENOUS | Status: AC | PRN
  Administered 2023-03-05: 1000 mg via INTRAVENOUS

## 2023-03-05 MED ORDER — HEPARIN SODIUM (PORCINE) 1000 UNIT/ML IJ SOLN
INTRAMUSCULAR | Status: AC
  Filled 2023-03-05: qty 4

## 2023-03-05 NOTE — Consult Note (Signed)
Chief Complaint: Patient was seen in consultation today for [facility] placement Chief Complaint  Patient presents with   Abdominal Pain   at the request of Sabra Heck   Referring Physician(s): Sabra Heck    Supervising Physician: Ruel Favors  Patient Status: [facility] - In-pt  History of Present Illness: George Day is a 47 y.o. 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   Patient came to ED on 03/03/23 due to abd/left flank pain CT AP w/o was unremarkable. Labs with stable BMP and CBC, no s.s of acute infection.  Patient has been followed by nephrology as outpatient due to CKD 5, nephrology was consulted upon admission and initiation of HD was recommended to the patient which he agreed to proceed.   IR was requested for image guided [facility] placement.   Patient laying in bed, not in acute distress.  Denise headache, fever, chills, shortness of breath, cough, chest pain, abdominal pain, nausea ,vomiting, and bleeding.   Past Medical History:  Diagnosis Date   (HFpEF) heart failure with preserved ejection fraction (HCC)    Anemia 1998   Anginal pain (HCC)    Anxiety    Asthma    CKD (chronic kidney disease) stage 5, GFR less than 15 ml/min (HCC)    Coronary artery disease    Diabetes mellitus without complication (HCC) 1995   Hypertension 2019   MI (myocardial infarction) (HCC) 2010   Multiple sclerosis (HCC)    Sleep apnea    Stroke (HCC) 2010    Past Surgical History:  Procedure Laterality Date   BIOPSY  08/18/2022   Procedure: BIOPSY;  Surgeon: Imogene Burn, MD;  Location: [facility] ENDOSCOPY;  Service: Gastroenterology;;   CARDIAC CATHETERIZATION  2010   COLONOSCOPY WITH PROPOFOL N/A 08/18/2022   Procedure: COLONOSCOPY WITH PROPOFOL;  Surgeon: Imogene Burn, MD;  Location: [facility] ENDOSCOPY;  Service: Gastroenterology;  Laterality: N/A;   ESOPHAGOGASTRODUODENOSCOPY  (EGD) WITH PROPOFOL N/A 08/18/2022   Procedure: ESOPHAGOGASTRODUODENOSCOPY (EGD) WITH PROPOFOL;  Surgeon: Imogene Burn, MD;  Location: [facility] ENDOSCOPY;  Service: Gastroenterology;  Laterality: N/A;   POLYPECTOMY  08/18/2022   Procedure: POLYPECTOMY;  Surgeon: Imogene Burn, MD;  Location: [facility] ENDOSCOPY;  Service: Gastroenterology;;    Allergies: Honey bee venom [bee venom], Penicillins, and Tomato  Medications: Prior to Admission medications   Medication Sig Start Date End Date Taking? Authorizing Provider  amLODipine (NORVASC) 10 MG tablet Take 1 tablet (10 mg total) by mouth daily. 11/21/22   Ngetich, Dinah C, NP  aspirin EC 81 MG tablet Take 1 tablet (81 mg total) by mouth daily. 11/21/22   Ngetich, Dinah C, NP  atorvastatin (LIPITOR) 40 MG tablet Take 1 tablet (40 mg total) by mouth daily. 11/21/22   Ngetich, Dinah C, NP  carvedilol (COREG) 25 MG tablet Take 2 tablets (50 mg total) by mouth 2 (two) times daily. 11/21/22   Ngetich, Dinah C, NP  famotidine (PEPCID) 20 MG tablet Take 1 tablet (20 mg total) by mouth daily. 11/21/22   Ngetich, Dinah C, NP  Fluticasone-Umeclidin-Vilant (TRELEGY ELLIPTA) 100-62.5-25 MCG/ACT AEPB Inhale 1 puff into the lungs daily. 11/21/22   Ngetich, Dinah C, NP  isosorbide-hydrALAZINE (BIDIL) 20-37.5 MG tablet Take 1 tablet by mouth 3 (three) times daily. 02/15/23 03/17/23  Arrien, York Ram, MD  nitroGLYCERIN (NITROSTAT) 0.6 MG SL tablet Place 1 tablet (0.6 mg total) under the tongue  every 5 (five) minutes as needed for chest pain. 11/21/22   Ngetich, Dinah C, NP  potassium chloride SA (KLOR-CON M) 20 MEQ tablet Take 1 tablet (20 mEq total) by mouth daily. 02/15/23   Arrien, York Ram, MD  torsemide (DEMADEX) 20 MG tablet Take 2 tablets (40 mg total) by mouth daily. 02/15/23 03/17/23  Arrien, York Ram, MD     Family History  Problem Relation Age of Onset   Diabetes Mother    Hyperlipidemia Mother    Kidney disease Mother    Drug abuse Father    Diabetes  Sister    Hyperlipidemia Sister    Hearing loss Sister    Diabetes Brother    Hyperlipidemia Brother     Social History   Socioeconomic History   Marital status: Married    Spouse name: Not on file   Number of children: 4   Years of education: Not on file   Highest education level: 10th grade  Occupational History   Occupation: Hydrologist  Tobacco Use   Smoking status: Former    Current packs/day: 0.00    Average packs/day: 0.5 packs/day for 31.0 years (15.5 ttl pk-yrs)    Types: Cigars, E-cigarettes, Cigarettes    Start date: 09/1990    Quit date: 09/2021    Years since quitting: 1.4   Smokeless tobacco: Never  Vaping Use   Vaping status: Never Used  Substance and Sexual Activity   Alcohol use: Not Currently    Comment: occasioanlly    Drug use: Yes    Types: Marijuana    Comment: 2 x week   Sexual activity: Yes    Birth control/protection: None  Other Topics Concern   Not on file  Social History Narrative   Not on file   Social Determinants of Health   Financial Resource Strain: Medium Risk (09/20/2022)   Overall Financial Resource Strain (CARDIA)    Difficulty of Paying Living Expenses: Somewhat hard  Food Insecurity: No Food Insecurity (03/03/2023)   Hunger Vital Sign    Worried About Running Out of Food in the Last Year: Never true    Ran Out of Food in the Last Year: Never true  Transportation Needs: No Transportation Needs (03/03/2023)   PRAPARE - Administrator, Civil Service (Medical): No    Lack of Transportation (Non-Medical): No  Physical Activity: Not on file  Stress: Not on file  Social Connections: Not on file     Review of Systems: A 12 point ROS discussed and pertinent positives are indicated in the HPI above.  All other systems are negative.  Vital Signs: BP (!) 169/102 (BP Location: Right Arm)   Pulse 78   Temp 98 F (36.7 C) (Oral)   Resp 19   Wt 202 lb 13.2 oz (92 kg)   SpO2 98%   BMI 29.95 kg/m     Physical Exam Vitals and nursing note reviewed.  Constitutional:      General: Patient is not in acute distress.    Appearance: Normal appearance. Patient is not ill-appearing.  HENT:     Head: Normocephalic and atraumatic.     Mouth/Throat:     Mouth: Mucous membranes are moist.     Pharynx: Oropharynx is clear.  Cardiovascular:     Rate and Rhythm: Normal rate and regular rhythm.     Pulses: Normal pulses.     Heart sounds: Normal heart sounds.  Pulmonary:     Effort: Pulmonary effort is  normal.     Breath sounds: Normal breath sounds.  Abdominal:     General: Abdomen is flat. Bowel sounds are normal.     Palpations: Abdomen is soft.  Musculoskeletal:     Cervical back: Neck supple.  Skin:    General: Skin is warm and dry.     Coloration: Skin is not jaundiced or pale.  Neurological:     Mental Status: Patient is alert and oriented to person, place, and time.  Psychiatric:        Mood and Affect: Mood normal.        Behavior: Behavior normal.        Judgment: Judgment normal.    MD Evaluation Airway: WNL Heart: WNL Abdomen: WNL Chest/ Lungs: WNL ASA  Classification: 3 Mallampati/Airway Score: Two  Imaging: CT Renal Stone Study  Result Date: 03/03/2023 CLINICAL DATA:  Flank pain EXAM: CT ABDOMEN AND PELVIS WITHOUT CONTRAST TECHNIQUE: Multidetector CT imaging of the abdomen and pelvis was performed following the standard protocol without IV contrast. RADIATION DOSE REDUCTION: This exam was performed according to the departmental dose-optimization program which includes automated exposure control, adjustment of the mA and/or kV according to patient size and/or use of iterative reconstruction technique. COMPARISON:  CT 08/16/2022, MRI 10/28/2022 FINDINGS: Lower chest: Lung bases demonstrate no acute airspace disease. Cardiomegaly with trace pericardial effusion. Hepatobiliary: No focal liver abnormality is seen. No gallstones, gallbladder wall thickening, or biliary  dilatation. Pancreas: Unremarkable. No pancreatic ductal dilatation or surrounding inflammatory changes. Spleen: Normal in size without focal abnormality. Adrenals/Urinary Tract: Stable normal left adrenal gland. Small exophytic hypodensity midpole left kidney, likely a cyst, no imaging follow-up is recommended. Hypodense right adrenal mass with peripheral calcifications, this measures 8 x 4.6 cm, previously 8.2 by 4.8 cm by CT. There is no hydronephrosis or ureteral stone. The bladder is unremarkable Stomach/Bowel: Stomach nonenlarged. No dilated small bowel. Negative appendix. Vascular/Lymphatic: Mild aortic atherosclerosis. No aneurysm. No suspicious lymph nodes. Reproductive: Prostate is unremarkable. Other: Negative for pelvic effusion or free air. Musculoskeletal: No acute or suspicious osseous abnormality IMPRESSION: 1. Negative for hydronephrosis or ureteral stone. 2. Cardiomegaly with trace pericardial effusion. 3. 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 Aortic atherosclerosis. Aortic Atherosclerosis (ICD10-I70.0). Electronically Signed   By: Jasmine Pang M.D.   On: 03/03/2023 19:31   ECHOCARDIOGRAM COMPLETE  Result Date: 02/14/2023    ECHOCARDIOGRAM REPORT   Patient Name:   George Day Date of Exam: 02/14/2023 Medical Rec #:  [id number]      Height:       69.0 in Accession #:    [id number]     Weight:       198.0 lb Date of Birth:  April 15, 1976       BSA:          2.057 m Patient Age:    47 years       BP:           175/107 mmHg Patient Gender: M              HR:           69 bpm. Exam Location:  Inpatient Procedure: 2D Echo, Cardiac Doppler and Color Doppler Indications:    CHF-Acute Diastolic I50.31  History:        Patient has prior history of Echocardiogram examinations, most  recent 09/18/2022. CHF, CAD, COPD, Signs/Symptoms:Chest Pain;                 Risk  Factors:Hypertension, Diabetes, Dyslipidemia and Former                 Smoker. CKD, stage 4.  Sonographer:    Lucendia Herrlich Referring Phys: [id number] RONDELL A SMITH IMPRESSIONS  1. Left ventricular ejection fraction, by estimation, is 50 to 55%. The left ventricle has low normal function. The left ventricle has no regional wall motion abnormalities. There is severe concentric left ventricular hypertrophy. Left ventricular diastolic parameters are consistent with Grade III diastolic dysfunction (restrictive).  2. Right ventricular systolic function is normal. The right ventricular size is normal. Tricuspid regurgitation signal is inadequate for assessing PA pressure.  3. Left atrial size was moderately dilated.  4. A large pericardial effusion is present and measures largest around the RA best visualized on subcostal views. The pericardial effusion is circumferential. There is no evidence of cardiac tamponade.  5. The mitral valve is grossly normal. Mild mitral valve regurgitation.  6. The aortic valve is tricuspid. Aortic valve regurgitation is not visualized. Aortic valve sclerosis is present, with no evidence of aortic valve stenosis.  7. The inferior vena cava is dilated in size with <50% respiratory variability, suggesting right atrial pressure of 15 mmHg. Comparison(s): Compared to prior TTE in 08/2022, the pericardial effusion is slightly larger around the RA but no evidence of tamponade physiology. FINDINGS  Left Ventricle: Left ventricular ejection fraction, by estimation, is 50 to 55%. The left ventricle has low normal function. The left ventricle has no regional wall motion abnormalities. The left ventricular internal cavity size was normal in size. There is severe concentric left ventricular hypertrophy. Left ventricular diastolic parameters are consistent with Grade III diastolic dysfunction (restrictive). Right Ventricle: The right ventricular size is normal. No increase in right ventricular wall  thickness. Right ventricular systolic function is normal. Tricuspid regurgitation signal is inadequate for assessing PA pressure. Left Atrium: Left atrial size was moderately dilated. Right Atrium: Right atrial size was normal in size. Pericardium: A large pericardial effusion is present and measures largest around the RA best visualized on subcostal views. The pericardial effusion is circumferential. There is no evidence of cardiac tamponade. Mitral Valve: The mitral valve is grossly normal. There is mild thickening of the mitral valve leaflet(s). Mild mitral valve regurgitation. Tricuspid Valve: The tricuspid valve is normal in structure. Tricuspid valve regurgitation is trivial. Aortic Valve: The aortic valve is tricuspid. Aortic valve regurgitation is not visualized. Aortic valve sclerosis is present, with no evidence of aortic valve stenosis. Aortic valve peak gradient measures 10.2 mmHg. Pulmonic Valve: The pulmonic valve was normal in structure. Pulmonic valve regurgitation is trivial. Aorta: The aortic root is normal in size and structure. Venous: The inferior vena cava is dilated in size with less than 50% respiratory variability, suggesting right atrial pressure of 15 mmHg. IAS/Shunts: The atrial septum is grossly normal.  LEFT VENTRICLE PLAX 2D LVIDd:         4.70 cm   Diastology LVIDs:         3.30 cm   LV e' medial:    7.77 cm/s LV PW:         1.50 cm   LV E/e' medial:  14.8 LV IVS:        1.30 cm   LV e' lateral:   7.80 cm/s LVOT diam:     2.00 cm   LV  E/e' lateral: 14.7 LV SV:         74 LV SV Index:   36 LVOT Area:     3.14 cm  RIGHT VENTRICLE             IVC RV S prime:     16.40 cm/s  IVC diam: 2.30 cm TAPSE (M-mode): 2.2 cm LEFT ATRIUM              Index        RIGHT ATRIUM           Index LA diam:        4.70 cm  2.28 cm/m   RA Area:     17.10 cm LA Vol (A2C):   86.7 ml  42.14 ml/m  RA Volume:   41.30 ml  20.08 ml/m LA Vol (A4C):   94.0 ml  45.69 ml/m LA Biplane Vol: 100.0 ml 48.61 ml/m   AORTIC VALVE                 PULMONIC VALVE AV Area (Vmax): 2.61 cm     PR End Diast Vel: 9.12 msec AV Vmax:        160.00 cm/s AV Peak Grad:   10.2 mmHg LVOT Vmax:      133.00 cm/s LVOT Vmean:     86.367 cm/s LVOT VTI:       0.237 m  AORTA Ao Root diam: 3.00 cm Ao Asc diam:  3.20 cm MITRAL VALVE MV Area (PHT): 4.80 cm     SHUNTS MV Decel Time: 158 msec     Systemic VTI:  0.24 m MR Peak grad: 39.7 mmHg     Systemic Diam: 2.00 cm MR Vmax:      315.00 cm/s MV E velocity: 115.00 cm/s MV A velocity: 49.90 cm/s MV E/A ratio:  2.30 Laurance Flatten MD Electronically signed by Laurance Flatten MD Signature Date/Time: 02/14/2023/1:58:47 PM    Final    DG Chest Portable 1 View  Result Date: 02/12/2023 CLINICAL DATA:  Chest pain. EXAM: PORTABLE CHEST 1 VIEW COMPARISON:  Radiograph 09/17/2022, CT 11/01/2022 FINDINGS: The heart is enlarged, progressive. Interstitial and septal thickening typical of pulmonary edema. Small bilateral pleural effusions suspected. No evidence of confluent consolidation. No pneumothorax. On limited assessment, no acute osseous findings. IMPRESSION: Cardiomegaly with pulmonary edema and small pleural effusions consistent with CHF. Electronically Signed   By: Narda Rutherford M.D.   On: 02/12/2023 23:38    Labs:  CBC: Recent Labs    03/01/23 1005 03/03/23 1912 03/04/23 0135 03/05/23 0104  WBC 6.6 6.7 6.0 5.2  HGB 9.9* 9.3* 8.8* 9.0*  HCT 33.3* 29.1* 27.8* 27.5*  PLT 283 228 225 200    COAGS: Recent Labs    08/16/22 0622 02/12/23 2335  INR 1.1 1.0    BMP: Recent Labs    02/15/23 0054 03/01/23 1005 03/03/23 1912 03/04/23 0135 03/05/23 0104  NA 135 141 137 136 134*  K 3.1* 3.9 3.4* 3.0* 3.4*  CL 104 102 105 106 105  CO2 22 23 22 22 22   GLUCOSE 97 93 109* 127* 100*  BUN 52* 50* 54* 51* 54*  CALCIUM 8.2* 8.7 8.7* 8.1* 8.3*  CREATININE 5.15* 4.93* 5.69* 5.60* 5.97*  GFRNONAA 13*  --  12* 12* 11*    LIVER FUNCTION TESTS: Recent Labs    08/16/22 0622  09/20/22 0001 11/22/22 0811 02/12/23 2335 02/13/23 1615 02/15/23 0054 03/03/23 1912 03/04/23 0135 03/05/23 0104  BILITOT 0.9  --  0.6 1.1  --   --  0.9  --   --   AST 30  --  22 28  --   --  23  --   --   ALT 36  --  27 32  --   --  22  --   --   ALKPHOS 85  --   --  100  --   --  64  --   --   PROT 6.5  --  6.8 6.2*  --   --  7.1  --   --   ALBUMIN 3.0*   < >  --  2.7*   < > 2.5* 3.1* 2.9* 2.9*   < > = values in this interval not displayed.    TUMOR MARKERS: No results for input(s): "AFPTM", "CEA", "CA199", "CHROMGRNA" in the last 8760 hours.  Assessment and Plan: 47 y.o. male with CKD 5 with progressive SOB who is in need of HD.   NPO since Mn VS hypertensive but stable 169/102 No s/s of infection Allergic to penicillin, 1 g vanc to be given in IR.   Risks and benefits discussed with the patient including, but not limited to bleeding, infection, vascular injury, pneumothorax which may require chest tube placement, air embolism or even death  All of the patient's questions were answered, patient is agreeable to proceed. Consent signed and in chart.    Thank you for this interesting consult.  I greatly enjoyed meeting Donell Tomkins and look forward to participating in their care.  A copy of this report was sent to the requesting provider on this date.  Electronically Signed: Willette Brace, PA-C 03/05/2023, 9:21 AM   I spent a total of 20 Minutes    in face to face in clinical consultation, greater than 50% of which was counseling/coordinating care for [facility] placement.   This chart was dictated using voice recognition software.  Despite best efforts to proofread,  errors can occur which can change the documentation meaning.

## 2023-03-05 NOTE — Plan of Care (Signed)
  Problem: Education: Goal: Knowledge of General Education information will improve Description: Including pain rating scale, medication(s)/side effects and non-pharmacologic comfort measures Outcome: Progressing   Problem: Clinical Measurements: Goal: Ability to maintain clinical measurements within normal limits will improve Outcome: Progressing Goal: Will remain free from infection Outcome: Progressing Goal: Diagnostic test results will improve Outcome: Progressing Goal: Respiratory complications will improve Outcome: Progressing Goal: Cardiovascular complication will be avoided Outcome: Progressing   Problem: Activity: Goal: Risk for activity intolerance will decrease Outcome: Progressing   Problem: Nutrition: Goal: Adequate nutrition will be maintained Outcome: Progressing   Problem: Coping: Goal: Level of anxiety will decrease Outcome: Progressing   Problem: Elimination: Goal: Will not experience complications related to bowel motility Outcome: Progressing Goal: Will not experience complications related to urinary retention Outcome: Progressing   Problem: Pain Managment: Goal: General experience of comfort will improve Outcome: Progressing   Problem: Safety: Goal: Ability to remain free from injury will improve Outcome: Progressing   Problem: Skin Integrity: Goal: Risk for impaired skin integrity will decrease Outcome: Progressing   Problem: Education: Goal: Knowledge of disease and its progression will improve Outcome: Progressing Goal: Individualized Educational Video(s) Outcome: Progressing   Problem: Fluid Volume: Goal: Compliance with measures to maintain balanced fluid volume will improve Outcome: Progressing   Problem: Health Behavior/Discharge Planning: Goal: Ability to manage health-related needs will improve Outcome: Progressing   Problem: Nutritional: Goal: Ability to make healthy dietary choices will improve Outcome: Progressing    Problem: Clinical Measurements: Goal: Complications related to the disease process, condition or treatment will be avoided or minimized Outcome: Progressing

## 2023-03-05 NOTE — Progress Notes (Addendum)
HD#2 SUBJECTIVE:  Patient Summary: George Day is a 47 y.o. 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   Overnight Events: Patient reports he didn't sleep well but denies pain today. He is ready to start getting dialysis so he can eat.    OBJECTIVE:  Vital Signs: Vitals:   03/05/23 1105 03/05/23 1110 03/05/23 1115 03/05/23 1120  BP: (!) 190/115 (!) 190/99 (!) 185/94 (!) 182/110  Pulse: 70 71 71 71  Resp: 20 20 20 20   Temp:      TempSrc:      SpO2: 100% 99% 100% 98%  Weight:       Supplemental O2: Room Air SpO2: 98 % O2 Flow Rate (L/min): 2 L/min  Filed Weights   03/03/23 2336  Weight: 92 kg     Intake/Output Summary (Last 24 hours) at 03/05/2023 1321 Last data filed at 03/05/2023 1303 Gross per 24 hour  Intake 237 ml  Output 3550 ml  Net -3313 ml   Net IO Since Admission: -4,078.55 mL [03/05/23 1321]  Physical Exam Constitutional:      General: He is not in acute distress. HENT:     Head: Normocephalic and atraumatic.  Cardiovascular:     Rate and Rhythm: Normal rate and regular rhythm.  Pulmonary:     Effort: Pulmonary effort is normal. No respiratory distress.     Breath sounds: Normal breath sounds.  Abdominal:     General: Bowel sounds are normal. There is distension.     Palpations: Abdomen is soft.  Skin:    General: Skin is warm and dry.  Neurological:     General: No focal deficit present.     Mental Status: He is alert and oriented to person, place, and time.  Psychiatric:        Mood and Affect: Mood normal.        Behavior: Behavior normal.     Patient Lines/Drains/Airways Status     Active Line/Drains/Airways     Name Placement date Placement time Site Days   Peripheral IV 03/03/23 20 G Anterior;Left Forearm 03/03/23  1915  Forearm  1            Pertinent Labs:     Latest Ref Rng & Units 03/05/2023    1:04 AM 03/04/2023    1:35 AM 03/03/2023    7:12 PM  CBC  WBC 4.0 - 10.5 K/uL 5.2  6.0  6.7   Hemoglobin 13.0 - 17.0 g/dL 9.0  8.8  9.3   Hematocrit 39.0 - 52.0 % 27.5  27.8  29.1   Platelets 150 - 400 K/uL 200  225  228        Latest Ref Rng & Units 03/05/2023    1:04 AM 03/04/2023    1:35 AM 03/03/2023    7:12 PM  CMP  Glucose 70 - 99 mg/dL 409  811  914   BUN 6 - 20 mg/dL 54  51  54   Creatinine 0.61 - 1.24 mg/dL 7.82  9.56  2.13   Sodium 135 - 145 mmol/L 134  136  137   Potassium 3.5 - 5.1 mmol/L 3.4  3.0  3.4   Chloride 98 - 111 mmol/L 105  106  105   CO2 22 - 32 mmol/L 22  22  22    Calcium  8.9 - 10.3 mg/dL 8.3  8.1  8.7   Total Protein 6.5 - 8.1 g/dL   7.1   Total Bilirubin 0.3 - 1.2 mg/dL   0.9   Alkaline Phos 38 - 126 U/L   64   AST 15 - 41 U/L   23   ALT 0 - 44 U/L   22     No results for input(s): "GLUCAP" in the last 72 hours.   Pertinent Imaging: CT Renal Stone Study  Result Date: 03/03/2023 CLINICAL DATA:  Flank pain EXAM: CT ABDOMEN AND PELVIS WITHOUT CONTRAST IMPRESSION: 1. Negative for hydronephrosis or ureteral stone. 2. Cardiomegaly with trace pericardial effusion. 3. 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 Aortic atherosclerosis. Aortic Atherosclerosis (ICD10-I70.0). Electronically Signed   By: Jasmine Pang M.D.   On: 03/03/2023 19:31    ASSESSMENT/PLAN:  Assessment: Principal Problem:   Chronic kidney disease (CKD), stage V (HCC)   George Day is a 47 y.o. 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 On hospital day 1  Plan: #CKD stage 5 Nephrology recommends starting HD inpatient, IR and vascular surgery contacted to place tunneled  catheter and create fistula in LUE. Patient will start HD today. -appreciate nephrology, IR, and vasc surg  -strict I/Os to monitor urine output -fluid restriction, 2076mL/day  Iron deficiency anemia MCV has showed a microcytic anemia that seems to be chronic. Ferritin and iron is low, replaced iron with Feraheme. Suspect his anemia is also multifactorial due to CKD, could also be anemia of chronic disease. Calculated iron deficit is 1383mg , so far we've given 510 with Feraheme on 7/15. Will replace with Feraheme on 7/18 as dosing is spaced 72 hours apart.  -Trend CBC -On 7/18 give Feraheme 510mg  IV x1 dose   Hypokalemia Potassium 3.4 today, improved from yesterday. Will monitor for dropping potassium after hemodialysis.  HTN Home regimen includes amlodipine 10 mg daily, carvedilol 25 mg BID, isosorbide-hydralazine 20-37.5 mg TID, torsemide 40 mg daily. Will hold until after hemodialysis. -Continue home meds after HD session    Best Practice: Diet: Renal diet IVF: Fluids: none VTE: heparin injection 5,000 Units Start: 03/04/23 1400 Code: Full Therapy Recs: none Family Contact: wife, to be notified. DISPO: Anticipated discharge in 1-3 days to Home pending  kidney function improvement or initiation of dialysis.   Signature: Gae Gallop, Medical Student   Please contact the on call pager after 5 pm and on weekends at [phone].   Attestation for Student Documentation:  I personally was present and performed or re-performed the history, physical exam and medical decision-making activities of this service and have verified that the service and findings are accurately documented in the student's note.  Chauncey Mann, DO 03/05/2023, 4:09 PM

## 2023-03-05 NOTE — Progress Notes (Signed)
Admit: 03/03/2023 LOS: 2  68M new ESRD with uremia  Subjective:  S/p R internal jugular TDC earlier today VVS following for access, vein mapping pending For HD #1 today CLIP in process No c/o or needs   07/15 0701 - 07/16 0700 In: 892 [P.O.:775; IV Piggyback:117] Out: 3000 [Urine:3000]  Filed Weights   03/03/23 2336  Weight: 92 kg    Scheduled Meds:  amLODipine  10 mg Oral Daily   aspirin EC  81 mg Oral Daily   atorvastatin  40 mg Oral Daily   carvedilol  50 mg Oral BID   Chlorhexidine Gluconate Cloth  6 each Topical Daily   docusate sodium  100 mg Oral BID   famotidine  20 mg Oral Daily   fluticasone furoate-vilanterol  1 puff Inhalation Daily   And   umeclidinium bromide  1 puff Inhalation Daily   heparin  5,000 Units Subcutaneous Q8H   isosorbide-hydrALAZINE  1 tablet Oral TID   potassium chloride SA  20 mEq Oral Daily   torsemide  40 mg Oral Daily   Continuous Infusions:  vancomycin     PRN Meds:.acetaminophen, HYDROmorphone  Current Labs: reviewed    Physical Exam:  Blood pressure (!) 182/110, pulse 71, temperature 98.3 F (36.8 C), resp. rate 20, weight 92 kg, SpO2 98%. GEN: NAD, Sitting at the edge of the bed finishing lunch ENT: NCAT EYES: EOMI CV: Regular, no rub PULM: Clear bilaterally, normal work of breathing ABD: Soft, nontender SKIN: No rashes or lesions EXT: No significant edema NEURO: AAO x4, no asterixus,   A New ESRD with uremia Start iHD: HD #1 after HD cath placement today CLIP, maybe to TCU R internal jugular TDC placed 7/16 with IR VVS consult for AVF, pt aware, vein mapping HTN, longstanding and uncontrolled with dialysis.  No immediate changes to medications; trend with HD and UF;  prob would add on ARB as next agent  Microcytic anemia with features of iron deficiency and CKD: Receiving IV iron and start aranesp 100 qTues Trace pericardial effusion likely due to ESRD, doubt hemodynamically significant. DM2 History of adrenal  mass, outpatient management  P HD later today CLIP in process Trend BP Start ESA Medication Issues; Preferred narcotic agents for pain control are hydromorphone, fentanyl, and methadone. Morphine should not be used.  Baclofen should be avoided Avoid oral sodium phosphate and magnesium citrate based laxatives / bowel preps    Sabra Heck MD 03/05/2023, 1:54 PM  Recent Labs  Lab 03/03/23 1912 03/04/23 0135 03/05/23 0104  NA 137 136 134*  K 3.4* 3.0* 3.4*  CL 105 106 105  CO2 22 22 22   GLUCOSE 109* 127* 100*  BUN 54* 51* 54*  CREATININE 5.69* 5.60* 5.97*  CALCIUM 8.7* 8.1* 8.3*  PHOS  --  4.2 4.5   Recent Labs  Lab 03/03/23 1912 03/04/23 0135 03/05/23 0104  WBC 6.7 6.0 5.2  NEUTROABS 4.9  --   --   HGB 9.3* 8.8* 9.0*  HCT 29.1* 27.8* 27.5*  MCV 69.5* 69.3* 69.1*  PLT 228 225 200

## 2023-03-05 NOTE — Progress Notes (Addendum)
Attempted to meet with pt regarding out-pt HD needs at d/c but pt currently unavailable. Will attempt to f/u with pt later today if possible.   Olivia Canter Renal Navigator [phone]  Addendum at 2:04 pm: Attempted to meet with pt x2 but pt off the floor.   Addendum at 3:52 pm: Met with pt at bedside while pt receiving HD treatment. Pt lives in [place] and discussed TCU option per MD recommendation. Discussed TCU schedule and goals. Pt and pt's wife agreeable to TCU referral. Pt's referral submitted to [facility] admissions for review. Pt voiced interest in transportation to HD. Per RN CM, pt's medicaid should be an option to assist with transportation at d/c. Will assist as needed.

## 2023-03-05 NOTE — Progress Notes (Signed)
BUE vein mapping has been completed.   Results can be found under chart review under CV PROC. 03/05/2023 2:32 PM Loribeth Katich RVT, RDMS

## 2023-03-05 NOTE — Progress Notes (Signed)
   03/05/23 1718  Vitals  Temp 98.3 F (36.8 C)  Pulse Rate 70  Resp (!) 22  BP (!) 204/108  SpO2 99 %  O2 Device Room Air  Post Treatment  Dialyzer Clearance Lightly streaked  Duration of HD Treatment -hour(s) 2 hour(s)  Hemodialysis Intake (mL) 0 mL  Liters Processed 24  Fluid Removed (mL) 0 mL  Tolerated HD Treatment Yes  Post-Hemodialysis Comments Tx completed. Tx tolerated well.   Received patient in bed to unit.  Alert and oriented.  Informed consent signed and in chart.   TX duration:2hrs  Patient tolerated well.  Transported back to the room  Alert, without acute distress.  Hand-off given to patient's nurse.   Access used: [facility] Access issues: none  Total UF removed: 0 Medication(s) given: none    Na'Shaminy T Lafe Clerk [facility]

## 2023-03-05 NOTE — Progress Notes (Signed)
   03/04/23 2240  BiPAP/CPAP/SIPAP  BiPAP/CPAP/SIPAP Pt Type Adult  Reason BIPAP/CPAP not in use Non-compliant   Patient refused CPAP HS tonight. Patient in no distress at this time.

## 2023-03-05 NOTE — Plan of Care (Signed)
Pt went for HD cath placement today. RN held off on BP medications due to pt going for dialysis after cath placement today 7/16. When pt came back to floor from HD cath placement RN spoke with hemodialysis nurse, per HD nurse do not give BP meds and hold off on giving even if pt last BP is 182/110 because pt new to dialysis may drop during treatment. RN made Dr.Grace Sol Blazing and team aware.

## 2023-03-05 NOTE — Procedures (Signed)
 Interventional Radiology Procedure Note  Procedure: RT internal jugular HD CATH TUNNELED    Complications: None  Estimated Blood Loss:  MIN  Findings: TIP SVCRA    Sharen Counter, MD

## 2023-03-06 LAB — CBC
HCT: 29 % — ABNORMAL LOW (ref 39.0–52.0)
Hemoglobin: 9.1 g/dL — ABNORMAL LOW (ref 13.0–17.0)
MCH: 21.6 pg — ABNORMAL LOW (ref 26.0–34.0)
MCHC: 31.4 g/dL (ref 30.0–36.0)
MCV: 68.7 fL — ABNORMAL LOW (ref 80.0–100.0)
Platelets: 200 10*3/uL (ref 150–400)
RBC: 4.22 MIL/uL (ref 4.22–5.81)
RDW: 17.2 % — ABNORMAL HIGH (ref 11.5–15.5)
WBC: 4.8 10*3/uL (ref 4.0–10.5)
nRBC: 0 % (ref 0.0–0.2)

## 2023-03-06 LAB — RENAL FUNCTION PANEL
Albumin: 3 g/dL — ABNORMAL LOW (ref 3.5–5.0)
Anion gap: 9 (ref 5–15)
BUN: 43 mg/dL — ABNORMAL HIGH (ref 6–20)
CO2: 23 mmol/L (ref 22–32)
Calcium: 8.5 mg/dL — ABNORMAL LOW (ref 8.9–10.3)
Chloride: 101 mmol/L (ref 98–111)
Creatinine, Ser: 5.29 mg/dL — ABNORMAL HIGH (ref 0.61–1.24)
GFR, Estimated: 13 mL/min — ABNORMAL LOW (ref >60)
Glucose, Bld: 89 mg/dL (ref 70–99)
Phosphorus: 4.8 mg/dL — ABNORMAL HIGH (ref 2.5–4.6)
Potassium: 3.4 mmol/L — ABNORMAL LOW (ref 3.5–5.1)
Sodium: 133 mmol/L — ABNORMAL LOW (ref 135–145)

## 2023-03-06 LAB — HEPATITIS B SURFACE ANTIBODY, QUANTITATIVE: Hep B S AB Quant (Post): <3.5 m[IU]/mL — ABNORMAL LOW

## 2023-03-06 MED ORDER — RENA-VITE PO TABS
1.0000 | ORAL_TABLET | Freq: Every day | ORAL | Status: DC
  Administered 2023-03-06: 1 via ORAL
  Filled 2023-03-06: qty 1

## 2023-03-06 MED ORDER — ENSURE ENLIVE PO LIQD: 237.0000 mL | Freq: Two times a day (BID) | ORAL | Status: DC

## 2023-03-06 NOTE — Progress Notes (Signed)
Pt has been accepted at TCU at [facility] on Monday, Tuesday, Thursday, Friday with 7:30 am chair time. Pt can start on Friday and will need to arrive at 7:00 am to complete paperwork prior to treatment. Met with pt at bedside to provide details. Schedule letter provided to pt as well. Pt agreeable to plan. Arrangements added to AVS. Update provided to attending, nephrologist and RN CM. Will assist as needed.   Olivia Canter Renal Navigator [phone]

## 2023-03-06 NOTE — Progress Notes (Addendum)
HD#3 SUBJECTIVE:  Patient Summary: George Day is a 47 y.o. 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   Overnight Events: Patient reports he slept better. He does feel some discomfort at his tunneled catheter site but no pain elsewhere.    OBJECTIVE:  Vital Signs: Vitals:   03/05/23 1824 03/05/23 1859 03/05/23 2103 03/06/23 0551  BP: (!) 203/112 (!) 199/107 (!) 159/83 (!) 146/92  Pulse:  70 71 68  Resp: 18 18 18 18   Temp:   98 F (36.7 C) 98.2 F (36.8 C)  TempSrc: Oral  Oral Oral  SpO2: 100%  99% 96%  Weight:       Supplemental O2: Room Air SpO2: 96 % O2 Flow Rate (L/min): 2 L/min FiO2 (%): 21 %  Filed Weights   03/03/23 2336  Weight: 92 kg     Intake/Output Summary (Last 24 hours) at 03/06/2023 0640 Last data filed at 03/06/2023 0552 Gross per 24 hour  Intake 120 ml  Output 2150 ml  Net -2030 ml   Net IO Since Admission: -4,908.55 mL [03/06/23 0640]  Physical Exam Constitutional:      General: He is not in acute distress. HENT:     Head: Normocephalic and atraumatic.  Cardiovascular:     Rate and Rhythm: Normal rate and regular rhythm.  Pulmonary:     Effort: Pulmonary effort is normal. No respiratory distress.     Breath sounds: Normal breath sounds.  Chest:     Comments: Right internal jugular HD tunneled catheter site: clean/dry/dressing intact  Abdominal:     General: Bowel sounds are normal. There is distension.     Palpations: Abdomen is soft.  Musculoskeletal:     Right lower leg: No edema.     Left lower leg: No edema.  Skin:    General: Skin is warm and dry.  Neurological:     General: No focal deficit present.     Mental Status: He is alert and oriented to person, place, and time.  Psychiatric:        Mood and Affect: Mood normal.        Behavior: Behavior normal.      Patient Lines/Drains/Airways Status     Active Line/Drains/Airways     Name Placement date Placement time Site Days   Peripheral IV 03/03/23 20 G Anterior;Left Forearm 03/03/23  1915  Forearm  1            Pertinent Labs:    Latest Ref Rng & Units 03/06/2023    3:11 AM 03/05/2023    1:04 AM 03/04/2023    1:35 AM  CBC  WBC 4.0 - 10.5 K/uL 4.8  5.2  6.0   Hemoglobin 13.0 - 17.0 g/dL 9.1  9.0  8.8   Hematocrit 39.0 - 52.0 % 29.0  27.5  27.8   Platelets 150 - 400 K/uL 200  200  225        Latest Ref Rng & Units 03/06/2023    3:11 AM 03/05/2023    1:04 AM 03/04/2023    1:35 AM  CMP  Glucose 70 - 99 mg/dL 89  119  147   BUN 6 - 20 mg/dL 43  54  51   Creatinine 0.61 - 1.24 mg/dL 8.29  5.62  1.30   Sodium 135 - 145 mmol/L  133  134  136   Potassium 3.5 - 5.1 mmol/L 3.4  3.4  3.0   Chloride 98 - 111 mmol/L 101  105  106   CO2 22 - 32 mmol/L 23  22  22    Calcium 8.9 - 10.3 mg/dL 8.5  8.3  8.1     No results for input(s): "GLUCAP" in the last 72 hours.   Pertinent Imaging: CT Renal Stone Study  Result Date: 03/03/2023 CLINICAL DATA:  Flank pain EXAM: CT ABDOMEN AND PELVIS WITHOUT CONTRAST IMPRESSION: 1. Negative for hydronephrosis or ureteral stone. 2. Cardiomegaly with trace pericardial effusion. 3. 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 Aortic atherosclerosis. Aortic Atherosclerosis (ICD10-I70.0). Electronically Signed   By: Jasmine Pang M.D.   On: 03/03/2023 19:31    ASSESSMENT/PLAN:  Assessment: Principal Problem:   Chronic kidney disease (CKD), stage V (HCC)   George Day is a 47 y.o. 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 On hospital day 2.  Plan: #CKD stage 5 Per  nephrology, inpatient HD started yesterday, patient will go again today. IR placed tunneled cath yesterday and vascular surgery plans to create fistula with graft in LUE tomorrow.  -NPO at midnight -appreciate nephrology, IR, and vasc surg  -weekly erythropoietin stimulating agent (next dose Tues 7/23) -strict I/Os to monitor urine output -fluid restriction, 2031mL/day  Iron deficiency anemia MCV has showed a microcytic anemia that seems to be chronic. Ferritin and iron is low, replaced iron with Feraheme. Suspect his anemia is also multifactorial due to CKD, could also be anemia of chronic disease. Calculated iron deficit is 1383mg , so far we've given 510 with Feraheme on 7/15. Will replace with Feraheme on 7/18 as dosing is spaced 72 hours apart.  -Trend CBC -On 7/18 give Feraheme 510mg  IV x1 dose   Hypokalemia Potassium 3.4 today. Will monitor for dropping potassium after hemodialysis.  HTN Home regimen includes amlodipine 10 mg daily, carvedilol 25 mg BID, isosorbide-hydralazine 20-37.5 mg TID, torsemide 40 mg daily. -Continue home meds    Best Practice: Diet: Renal diet IVF: Fluids: none VTE: heparin injection 5,000 Units Start: 03/04/23 1400 Code: Full Therapy Recs: none Family Contact: wife, to be notified. DISPO: Anticipated discharge in 1-3 days to Home pending  kidney function improvement or initiation of dialysis.   Signature: Gae Gallop, Medical Student   Please contact the on call pager after 5 pm and on weekends at [phone].

## 2023-03-06 NOTE — Progress Notes (Addendum)
  Progress Note    03/06/2023 8:07 AM * No surgery date entered *  Subjective:  no complaints   Vitals:   03/05/23 2103 03/06/23 0551  BP: (!) 159/83 (!) 146/92  Pulse: 71 68  Resp: 18 18  Temp: 98 F (36.7 C) 98.2 F (36.8 C)  SpO2: 99% 96%   Physical Exam: Lungs:  non labored Extremities:  moving all ext well Neurologic: A&O  CBC    Component Value Date/Time   WBC 4.8 03/06/2023 0311   RBC 4.22 03/06/2023 0311   HGB 9.1 (L) 03/06/2023 0311   HGB 9.9 (L) 03/01/2023 1005   HCT 29.0 (L) 03/06/2023 0311   HCT 33.3 (L) 03/01/2023 1005   PLT 200 03/06/2023 0311   PLT 283 03/01/2023 1005   MCV 68.7 (L) 03/06/2023 0311   MCV 74 (L) 03/01/2023 1005   MCH 21.6 (L) 03/06/2023 0311   MCHC 31.4 03/06/2023 0311   RDW 17.2 (H) 03/06/2023 0311   RDW 17.7 (H) 03/01/2023 1005   LYMPHSABS 1.3 03/03/2023 1912   MONOABS 0.4 03/03/2023 1912   EOSABS 0.1 03/03/2023 1912   BASOSABS 0.0 03/03/2023 1912    BMET    Component Value Date/Time   NA 133 (L) 03/06/2023 0311   NA 141 03/01/2023 1005   K 3.4 (L) 03/06/2023 0311   CL 101 03/06/2023 0311   CO2 23 03/06/2023 0311   GLUCOSE 89 03/06/2023 0311   BUN 43 (H) 03/06/2023 0311   BUN 50 (H) 03/01/2023 1005   CREATININE 5.29 (H) 03/06/2023 0311   CREATININE 4.05 (H) 11/22/2022 0811   CALCIUM 8.5 (L) 03/06/2023 0311   GFRNONAA 13 (L) 03/06/2023 0311   GFRAA 42 (L) 02/25/2020 1507    INR    Component Value Date/Time   INR 1.0 02/12/2023 2335     Intake/Output Summary (Last 24 hours) at 03/06/2023 0807 Last data filed at 03/06/2023 0552 Gross per 24 hour  Intake 120 ml  Output 1450 ml  Net -1330 ml     Assessment/Plan:  47 y.o. male with ESRD  Plan is for Left arm AVF vs graft tomorrow in OR with Dr. Lenell Antu.  Case was discussed in detail and all questions were answered.  Restrict L UE.  NPO past midnight.  Consent ordered.    Emilie Rutter, PA-C [facility] [phone] 03/06/2023 8:07  AM  I have independently interviewed and examined patient and agree with PA assessment and plan above.  Does not appear to have suitable surface veins by duplex on physical exam.  OR tomorrow for likely graft with possible fistula placement.  N.p.o. past midnight.  Niang Mitcheltree C. Randie Heinz, MD [facility] [place] Office: [phone] Pager: [phone]

## 2023-03-06 NOTE — Progress Notes (Signed)
Internal Medicine Clinic Attending  I was physically present during the key portions of the resident provided service and participated in the medical decision making of patient's management care. I reviewed pertinent patient test results.  The assessment, diagnosis, and plan were formulated together and I agree with the documentation in the resident's note.  Lau, Grace, MD  

## 2023-03-06 NOTE — Addendum Note (Signed)
Addended by: Dickie La on: 03/06/2023 02:15 PM   Modules accepted: Level of Service

## 2023-03-06 NOTE — Progress Notes (Addendum)
Initial Nutrition Assessment  DOCUMENTATION CODES:   Not applicable  INTERVENTION:  Encouraged adequate oral intake at meals.  Provide Ensure Enlive po BID, each supplement provides 350 kcal and 20 grams of protein.  Provided education regarding new ESRD on HD per consult (note to follow).  Provide Rena-vite po at bedtime.   NUTRITION DIAGNOSIS:   Inadequate oral intake related to decreased appetite as evidenced by per patient/family report.  GOAL:   Patient will meet greater than or equal to 90% of their needs  MONITOR:   PO intake, Supplement acceptance, Labs, Weight trends, I & O's  REASON FOR ASSESSMENT:   Consult Diet education  ASSESSMENT:   47 year old male with PMHx of MS, CVA, chronic pericarditis, MI, CAD, HTN, CKD stage V now with new ESRD with uremia starting HD.  7/16: s/p right internal jugular TDC and HD #1  Met with pt and his wife at bedside. Pt reports decreased appetite for 3-4 months PTA. He reports he just overall wasn't feeling well and didn't want to eat. He was still eating 2-3 meals per day but eating smaller portions at meals. For breakfast he eats eggs with sausage and coffee. For lunch he has burger or other small meal. Occasionally skips lunch. For dinner he has protein with sides. Reports allergy to raw tomatoes (throat swelling). Pt then later reported he can have tomato sauce and ketchup. Denies nausea, emesis, abdominal pain, or difficulty with chewing/swallowing. Discussed importance of adequate intake. PO intake documented this admission has been variable 0-95%. Encouraged adequate intake at meals. Pt reports he feels he can start eating more at meals. Pt also amenable to drink oral nutrition supplement to help meet kcal/protein needs.  Pt reports lately UBW has been 195-205 lbs. He reports he previously weighed 285 lbs, but that was when he had a significant amount of edema, so unsure how much of that was fluid.   Medications reviewed and  include: Colace 100 mg BID, famotidine, potassium chloride 20 mEq daily  Labs reviewed: Sodium 133, Potassium 3.4, BUN 43, Creatinine 5.29, Phosphorus 4.8  UOP: 2150 mL UOP (1 mL/kg/hr) in previous 24 hours  I/O: -4908.6 mL since admission  NUTRITION - FOCUSED PHYSICAL EXAM:  Flowsheet Row Most Recent Value  Orbital Region No depletion  Upper Arm Region No depletion  Thoracic and Lumbar Region No depletion  Buccal Region No depletion  Temple Region No depletion  Clavicle Bone Region No depletion  Clavicle and Acromion Bone Region No depletion  Scapular Bone Region No depletion  Dorsal Hand No depletion  Patellar Region No depletion  Anterior Thigh Region No depletion  Posterior Calf Region No depletion  Edema (RD Assessment) Mild  Hair Reviewed  Eyes Reviewed  Mouth Reviewed  Skin Reviewed  Nails Reviewed      Diet Order:   Diet Order             Diet NPO time specified  Diet effective midnight           Diet renal with fluid restriction Fluid restriction: 1200 mL Fluid; Room service appropriate? Yes; Fluid consistency: Thin  Diet effective now                  EDUCATION NEEDS:   Education needs have been addressed  Skin:  Skin Assessment: Reviewed RN Assessment  Last BM:  03/04/23 per chart  Height:   Ht Readings from Last 1 Encounters:  03/01/23 5\' 9"  (1.753 m)   Weight:  Wt Readings from Last 1 Encounters:  03/03/23 92 kg   Ideal Body Weight:  72.7 kg  BMI:  Body mass index is 29.95 kg/m.  Estimated Nutritional Needs:   Kcal:  2100-2300  Protein:  105-115 grams  Fluid:  UOP + 1 L  Daivon Rayos Tollie Eth, MS, RD, LDN, CNSC Pager number available on Amion

## 2023-03-06 NOTE — Progress Notes (Signed)
Admit: 03/03/2023 LOS: 3  36M new ESRD with uremia  Subjective:  S/p R internal jugular TDC yesterday and HD #1 yesterday PM CLIP in process probably to TCU For AVF/G tomorrow with VVS No c/o or needs K 3.4  07/16 0701 - 07/17 0700 In: 120 [P.O.:120] Out: 2150 [Urine:2150]  Filed Weights   03/03/23 2336  Weight: 92 kg    Scheduled Meds:  amLODipine  10 mg Oral Daily   aspirin EC  81 mg Oral Daily   atorvastatin  40 mg Oral Daily   carvedilol  50 mg Oral BID   Chlorhexidine Gluconate Cloth  6 each Topical Daily   darbepoetin (ARANESP) injection - DIALYSIS  100 mcg Subcutaneous Q Tue-1800   docusate sodium  100 mg Oral BID   famotidine  20 mg Oral Daily   fluticasone furoate-vilanterol  1 puff Inhalation Daily   And   umeclidinium bromide  1 puff Inhalation Daily   heparin  5,000 Units Subcutaneous Q8H   isosorbide-hydrALAZINE  1 tablet Oral TID   potassium chloride SA  20 mEq Oral Daily   torsemide  40 mg Oral Daily   Continuous Infusions:  vancomycin     PRN Meds:.acetaminophen, HYDROmorphone  Current Labs: reviewed    Physical Exam:  Blood pressure (!) 146/92, pulse 68, temperature 98.2 F (36.8 C), temperature source Oral, resp. rate 18, weight 92 kg, SpO2 96%. GEN: NAD, Sitting at the edge of the bed finishing lunch ENT: NCAT EYES: EOMI CV: Regular, no rub PULM: Clear bilaterally, normal work of breathing ABD: Soft, nontender SKIN: No rashes or lesions EXT: No significant edema NEURO: AAO x4, no asterixus,   A New ESRD with uremia Start iHD: HD #1 7/16 R internal jugular TDC placed 7/16 with IR CLIP, maybe to TCU, in process VVS consult for AVF, pt aware, surgery tomorrow HD#2 today HTN, longstanding and uncontrolled with dialysis.  No immediate changes to medications; trend with HD and UF;  prob would add on ARB as next agent; good UOP cont diuretic and KCl for now Microcytic anemia with features of iron deficiency and CKD: Receiving IV iron and  started aranesp 100 qTues Trace pericardial effusion likely due to ESRD, doubt hemodynamically significant. Will improve with HD DM2 History of adrenal mass, outpatient management  P HD later today, #2 CLIP in process Trend BP as above Start ESA Medication Issues; Preferred narcotic agents for pain control are hydromorphone, fentanyl, and methadone. Morphine should not be used.  Baclofen should be avoided Avoid oral sodium phosphate and magnesium citrate based laxatives / bowel preps    Sabra Heck MD 03/06/2023, 9:22 AM  Recent Labs  Lab 03/04/23 0135 03/05/23 0104 03/06/23 0311  NA 136 134* 133*  K 3.0* 3.4* 3.4*  CL 106 105 101  CO2 22 22 23   GLUCOSE 127* 100* 89  BUN 51* 54* 43*  CREATININE 5.60* 5.97* 5.29*  CALCIUM 8.1* 8.3* 8.5*  PHOS 4.2 4.5 4.8*   Recent Labs  Lab 03/03/23 1912 03/04/23 0135 03/05/23 0104 03/06/23 0311  WBC 6.7 6.0 5.2 4.8  NEUTROABS 4.9  --   --   --   HGB 9.3* 8.8* 9.0* 9.1*  HCT 29.1* 27.8* 27.5* 29.0*  MCV 69.5* 69.3* 69.1* 68.7*  PLT 228 225 200 200

## 2023-03-06 NOTE — Progress Notes (Signed)
Patient declined CPAP. Unit at bedside if he changes his mind.

## 2023-03-06 NOTE — Plan of Care (Signed)
Nutrition Education Note  RD consulted for Renal Education. Provided "Nutrition for Dialysis" handout from [employer] with patient/family. Reviewed food groups and provided written recommended serving sizes specifically determined for patient's current nutritional status.   Explained why diet restrictions are needed and provided lists of foods to limit/avoid that are high potassium, sodium, and phosphorus. Provided specific recommendations on safer alternatives of these foods. Strongly encouraged compliance of this diet.   Discussed importance of protein intake at each meal and snack. Provided examples of how to maximize protein intake throughout the day. Discussed need for fluid restriction with dialysis, importance of minimizing weight gain between HD treatments, and renal-friendly beverage options.  Encouraged pt to discuss specific diet questions/concerns with RD at HD outpatient facility. Teach back method used.  Expect good compliance.  Current diet order is renal with 1200 mL fluid restriction, patient is consuming approximately 0-95% of meals at this time. See other RD note from 03/06/23 for initial assessment.  Letta Median, MS, RD, LDN, CNSC Pager number available on Amion

## 2023-03-07 ENCOUNTER — Inpatient Hospital Stay (HOSPITAL_COMMUNITY): Payer: 59 | Admitting: Certified Registered"

## 2023-03-07 ENCOUNTER — Encounter (HOSPITAL_COMMUNITY): Payer: Self-pay | Admitting: Internal Medicine

## 2023-03-07 ENCOUNTER — Other Ambulatory Visit: Payer: Self-pay

## 2023-03-07 ENCOUNTER — Encounter (HOSPITAL_COMMUNITY)
Admission: EM | Disposition: A | Discharge status: Home / Self Care | Payer: Self-pay | Source: Home / Self Care | Attending: Internal Medicine

## 2023-03-07 DIAGNOSIS — N186 End stage renal disease: Secondary | ICD-10-CM

## 2023-03-07 DIAGNOSIS — I132 Hypertensive heart and chronic kidney disease with heart failure and with stage 5 chronic kidney disease, or end stage renal disease: Secondary | ICD-10-CM | POA: Diagnosis not present

## 2023-03-07 DIAGNOSIS — Z87891 Personal history of nicotine dependence: Secondary | ICD-10-CM | POA: Diagnosis not present

## 2023-03-07 DIAGNOSIS — I5032 Chronic diastolic (congestive) heart failure: Secondary | ICD-10-CM | POA: Diagnosis not present

## 2023-03-07 LAB — RENAL FUNCTION PANEL
Albumin: 3.2 g/dL — ABNORMAL LOW (ref 3.5–5.0)
Anion gap: 9 (ref 5–15)
BUN: 31 mg/dL — ABNORMAL HIGH (ref 6–20)
CO2: 25 mmol/L (ref 22–32)
Calcium: 8.3 mg/dL — ABNORMAL LOW (ref 8.9–10.3)
Chloride: 97 mmol/L — ABNORMAL LOW (ref 98–111)
Creatinine, Ser: 4.5 mg/dL — ABNORMAL HIGH (ref 0.61–1.24)
GFR, Estimated: 15 mL/min — ABNORMAL LOW (ref >60)
Glucose, Bld: 169 mg/dL — ABNORMAL HIGH (ref 70–99)
Phosphorus: 2.8 mg/dL (ref 2.5–4.6)
Potassium: 3.1 mmol/L — ABNORMAL LOW (ref 3.5–5.1)
Sodium: 131 mmol/L — ABNORMAL LOW (ref 135–145)

## 2023-03-07 LAB — SURGICAL PCR SCREEN
MRSA, PCR: NEGATIVE
Staphylococcus aureus: POSITIVE — AB

## 2023-03-07 LAB — GLUCOSE, CAPILLARY: Glucose-Capillary: 95 mg/dL (ref 70–99)

## 2023-03-07 LAB — MAGNESIUM: Magnesium: 2.2 mg/dL (ref 1.7–2.4)

## 2023-03-07 SURGERY — ARTERIOVENOUS (AV) FISTULA CREATION
Anesthesia: General

## 2023-03-07 MED ORDER — PHENYLEPHRINE 80 MCG/ML (10ML) SYRINGE FOR IV PUSH (FOR BLOOD PRESSURE SUPPORT)
PREFILLED_SYRINGE | INTRAVENOUS | Status: DC | PRN
  Administered 2023-03-07: 80 ug via INTRAVENOUS
  Administered 2023-03-07: 160 ug via INTRAVENOUS

## 2023-03-07 MED ORDER — PROPOFOL 10 MG/ML IV BOLUS
INTRAVENOUS | Status: AC
  Filled 2023-03-07: qty 20

## 2023-03-07 MED ORDER — ACETAMINOPHEN 500 MG PO TABS: 1000.0000 mg | ORAL_TABLET | Freq: Once | ORAL | Status: DC | PRN

## 2023-03-07 MED ORDER — PAPAVERINE HCL 30 MG/ML IJ SOLN
INTRAMUSCULAR | Status: AC
  Filled 2023-03-07: qty 2

## 2023-03-07 MED ORDER — ACETAMINOPHEN 160 MG/5ML PO SOLN: 1000.0000 mg | Freq: Once | ORAL | Status: DC | PRN

## 2023-03-07 MED ORDER — CHLORHEXIDINE GLUCONATE 0.12 % MT SOLN
OROMUCOSAL | Status: AC
  Filled 2023-03-07: qty 15

## 2023-03-07 MED ORDER — MIDAZOLAM HCL 2 MG/2ML IJ SOLN
INTRAMUSCULAR | Status: AC
  Filled 2023-03-07: qty 2

## 2023-03-07 MED ORDER — LIDOCAINE 2% (20 MG/ML) 5 ML SYRINGE
INTRAMUSCULAR | Status: DC | PRN
  Administered 2023-03-07: 100 mg via INTRAVENOUS

## 2023-03-07 MED ORDER — FENTANYL CITRATE (PF) 250 MCG/5ML IJ SOLN
INTRAMUSCULAR | Status: DC | PRN
  Administered 2023-03-07: 75 ug via INTRAVENOUS

## 2023-03-07 MED ORDER — FENTANYL CITRATE (PF) 250 MCG/5ML IJ SOLN
INTRAMUSCULAR | Status: AC
  Filled 2023-03-07: qty 5

## 2023-03-07 MED ORDER — OXYCODONE HCL 5 MG PO TABS: 5.0000 mg | ORAL_TABLET | Freq: Once | ORAL | Status: DC | PRN

## 2023-03-07 MED ORDER — SODIUM CHLORIDE 0.9 % IV SOLN: INTRAVENOUS | Status: DC | PRN

## 2023-03-07 MED ORDER — OXYCODONE HCL 5 MG/5ML PO SOLN: 5.0000 mg | Freq: Once | ORAL | Status: DC | PRN

## 2023-03-07 MED ORDER — MIDAZOLAM HCL 2 MG/2ML IJ SOLN
INTRAMUSCULAR | Status: DC | PRN
  Administered 2023-03-07: 2 mg via INTRAVENOUS

## 2023-03-07 MED ORDER — CHLORHEXIDINE GLUCONATE CLOTH 2 % EX PADS
6.0000 | MEDICATED_PAD | Freq: Every day | CUTANEOUS | Status: DC
  Administered 2023-03-07: 6 via TOPICAL

## 2023-03-07 MED ORDER — HEPARIN 6000 UNIT IRRIGATION SOLUTION
Status: AC
  Filled 2023-03-07: qty 500

## 2023-03-07 MED ORDER — ROCURONIUM BROMIDE 10 MG/ML (PF) SYRINGE
PREFILLED_SYRINGE | INTRAVENOUS | Status: DC | PRN
  Administered 2023-03-07: 70 mg via INTRAVENOUS

## 2023-03-07 MED ORDER — FENTANYL CITRATE (PF) 100 MCG/2ML IJ SOLN: 25.0000 ug | INTRAMUSCULAR | Status: DC | PRN

## 2023-03-07 MED ORDER — HEPARIN 6000 UNIT IRRIGATION SOLUTION
Status: DC | PRN
  Administered 2023-03-07: 1

## 2023-03-07 MED ORDER — ORAL CARE MOUTH RINSE: 15.0000 mL | OROMUCOSAL | Status: DC | PRN

## 2023-03-07 MED ORDER — LIDOCAINE-EPINEPHRINE (PF) 1 %-1:200000 IJ SOLN
INTRAMUSCULAR | Status: AC
  Filled 2023-03-07: qty 30

## 2023-03-07 MED ORDER — DOXYCYCLINE HYCLATE 100 MG PO TABS
100.0000 mg | ORAL_TABLET | Freq: Two times a day (BID) | ORAL | 0 refills | Status: DC
  Filled 2023-03-07 – 2023-03-13 (×2): qty 28, 14d supply, fill #0

## 2023-03-07 MED ORDER — PROPOFOL 10 MG/ML IV BOLUS
INTRAVENOUS | Status: DC | PRN
Start: 2023-03-07 — End: 2023-03-07
  Administered 2023-03-07: 60 mg via INTRAVENOUS
  Administered 2023-03-07: 140 mg via INTRAVENOUS

## 2023-03-07 MED ORDER — DOXYCYCLINE HYCLATE 100 MG PO TABS: 100.0000 mg | ORAL_TABLET | Freq: Two times a day (BID) | ORAL | Status: DC

## 2023-03-07 MED ORDER — MUPIROCIN 2 % EX OINT
1.0000 | TOPICAL_OINTMENT | Freq: Two times a day (BID) | CUTANEOUS | Status: DC
  Administered 2023-03-07: 1 via NASAL

## 2023-03-07 MED ORDER — DOXYCYCLINE HYCLATE 100 MG PO TABS: 100.0000 mg | ORAL_TABLET | Freq: Two times a day (BID) | ORAL | Status: AC

## 2023-03-07 MED ORDER — ACETAMINOPHEN 10 MG/ML IV SOLN: 1000.0000 mg | Freq: Once | INTRAVENOUS | Status: DC | PRN

## 2023-03-07 MED ORDER — SUGAMMADEX SODIUM 200 MG/2ML IV SOLN
INTRAVENOUS | Status: DC | PRN
  Administered 2023-03-07: 400 mg via INTRAVENOUS

## 2023-03-07 MED ORDER — 0.9 % SODIUM CHLORIDE (POUR BTL) OPTIME
TOPICAL | Status: DC | PRN
  Administered 2023-03-07: 1000 mL

## 2023-03-07 MED ORDER — SODIUM CHLORIDE 0.9 % IV SOLN
510.0000 mg | Freq: Once | INTRAVENOUS | Status: DC
  Filled 2023-03-07: qty 17

## 2023-03-07 MED ORDER — ONDANSETRON HCL 4 MG/2ML IJ SOLN
INTRAMUSCULAR | Status: DC | PRN
  Administered 2023-03-07: 4 mg via INTRAVENOUS

## 2023-03-07 MED ORDER — POTASSIUM CHLORIDE CRYS ER 20 MEQ PO TBCR: 40.0000 meq | EXTENDED_RELEASE_TABLET | Freq: Once | ORAL | Status: DC

## 2023-03-07 SURGICAL SUPPLY — 37 items
APL PRP STRL LF DISP 70% ISPRP (MISCELLANEOUS)
APL SKNCLS STERI-STRIP NONHPOA (GAUZE/BANDAGES/DRESSINGS)
ARMBAND PINK RESTRICT EXTREMIT (MISCELLANEOUS) ×1 IMPLANT
BENZOIN TINCTURE PRP APPL 2/3 (GAUZE/BANDAGES/DRESSINGS) ×1 IMPLANT
CANISTER SUCT 3000ML PPV (MISCELLANEOUS) ×1 IMPLANT
CANNULA VESSEL 3MM 2 BLNT TIP (CANNULA) ×1 IMPLANT
CHLORAPREP W/TINT 26 (MISCELLANEOUS) ×1 IMPLANT
CLIP LIGATING EXTRA MED SLVR (CLIP) ×1 IMPLANT
CLIP LIGATING EXTRA SM BLUE (MISCELLANEOUS) ×1 IMPLANT
COVER PROBE W GEL 5X96 (DRAPES) IMPLANT
DRAPE HALF SHEET 40X57 (DRAPES) IMPLANT
ELECT REM PT RETURN 9FT ADLT (ELECTROSURGICAL)
ELECTRODE REM PT RTRN 9FT ADLT (ELECTROSURGICAL) ×1 IMPLANT
GLOVE BIO SURGEON STRL SZ8 (GLOVE) ×1 IMPLANT
GOWN STRL REUS W/ TWL LRG LVL3 (GOWN DISPOSABLE) ×2 IMPLANT
GOWN STRL REUS W/ TWL XL LVL3 (GOWN DISPOSABLE) ×1 IMPLANT
GOWN STRL REUS W/TWL LRG LVL3 (GOWN DISPOSABLE)
GOWN STRL REUS W/TWL XL LVL3 (GOWN DISPOSABLE)
INSERT FOGARTY SM (MISCELLANEOUS) IMPLANT
KIT BASIN OR (CUSTOM PROCEDURE TRAY) ×1 IMPLANT
KIT TURNOVER KIT B (KITS) ×1 IMPLANT
NDL 18GX1X1/2 (RX/OR ONLY) (NEEDLE) IMPLANT
NEEDLE 18GX1X1/2 (RX/OR ONLY) (NEEDLE) IMPLANT
NS IRRIG 1000ML POUR BTL (IV SOLUTION) ×1 IMPLANT
PACK CV ACCESS (CUSTOM PROCEDURE TRAY) ×1 IMPLANT
PAD ARMBOARD 7.5X6 YLW CONV (MISCELLANEOUS) ×2 IMPLANT
SLING ARM FOAM STRAP LRG (SOFTGOODS) IMPLANT
SLING ARM FOAM STRAP MED (SOFTGOODS) IMPLANT
STRIP CLOSURE SKIN 1/2X4 (GAUZE/BANDAGES/DRESSINGS) ×1 IMPLANT
SUT MNCRL AB 4-0 PS2 18 (SUTURE) ×1 IMPLANT
SUT PROLENE 6 0 BV (SUTURE) ×1 IMPLANT
SUT VIC AB 3-0 SH 27 (SUTURE)
SUT VIC AB 3-0 SH 27X BRD (SUTURE) ×1 IMPLANT
SYR 3ML LL SCALE MARK (SYRINGE) IMPLANT
TOWEL GREEN STERILE (TOWEL DISPOSABLE) ×1 IMPLANT
UNDERPAD 30X36 HEAVY ABSORB (UNDERPADS AND DIAPERS) ×1 IMPLANT
WATER STERILE IRR 1000ML POUR (IV SOLUTION) ×1 IMPLANT

## 2023-03-07 NOTE — Progress Notes (Signed)
Advised earlier today of plan for pt to d/c after procedure today. TCU advised that pt should d/c after procedure and start at [facility] tomorrow. Renal PA has sent orders. Met with pt yesterday to provide details and added to AVS as well.   Olivia Canter Renal Navigator [phone]

## 2023-03-07 NOTE — Op Note (Signed)
Operative procedure aborted. See progress note for details.   Rande Brunt. Lenell Antu, MD [facility] Phone Number: [phone] 03/07/2023 4:56 PM

## 2023-03-07 NOTE — Progress Notes (Signed)
After induction of anesthesia, we identified a abscess in the left axilla which appeared consistent with hidradenitis suppurativa.  I was concerned that placement of an arteriovenous graft would likely become infected with this nearby.  I discussed my concerns with the patient's wife via telephone.  I offered to place a forearm loop graft.  The patient's wife was unsure that the patient would want to pursue this, and elected to abandon the procedure today.  I will prescribe him a 2-week course of doxycycline and reevaluate him in the office in 2 weeks to discuss left arm arteriovenous graft at that time.  He is safe for discharge.  Rande Brunt. Lenell Antu, MD [facility] Phone Number: [phone] 03/07/2023 4:55 PM

## 2023-03-07 NOTE — Discharge Planning (Signed)
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, MI with no stent placement, CAD, HTN   Date of First Dialysis: 03/05/23 Cause of renal disease: HTN  Dialysis Prescription: Dialysis Frequency: MTThF Tx duration: 3hrs  BFR: 300>350>400  DFR: 400>500>600>700  EDW: 92kg - call provider on call if seems way off or needs to be challenged  Dialyzer: 180NRe UF profile/Sodium modeling?: no Dialysis Bath: 4 K 2 Ca  Dialysis access: Access type: TDC LU AVF vs AVG (to be done this afternoon) Date placed: 03/07/23 Surgeon: Lenell Antu Needle gauge: none  In Center Medications: Heparin Dose: 1500  Type: porcine VDRA: per protocol Venofer: 100mg  IV qHD x5, 500mg  given during admission.  Mircera: q2wks Next dose due: 7/23 Sensipar: per protocol  Discharge labs: Hgb: 9.1 K+: 3.1 Ca: 8.3  Phos: 2.8 Alb: 3.2  Please draw routine labs. Additional labs needed: monthly labs 1st HD Additional notes/follow-up:

## 2023-03-07 NOTE — Progress Notes (Signed)
Received patient in bed to unit.  Alert and oriented.  Informed consent signed and in chart.   TX duration:2.5 hours  Patient tolerated well.  Transported back to the room  Alert, without acute distress.  Hand-off given to patient's nurse.   Access used: Catheter Access issues: none  Total UF removed: 1000 mls Medication(s) given: none Post HD VS: 174/104 Post HD weight: 89.4 kg    03/06/23 2223  Vitals  Temp 98.5 F (36.9 C)  Temp Source Oral  BP (!) 174/104  MAP (mmHg) 120  BP Location Right Arm  BP Method Automatic  Patient Position (if appropriate) Lying  Pulse Rate 70  ECG Heart Rate 70  Resp (!) 26  Oxygen Therapy  SpO2 99 %  O2 Device Room Air  Patient Activity (if Appropriate) In bed  Pulse Oximetry Type Continuous  Post Treatment  Dialyzer Clearance Lightly streaked  Duration of HD Treatment -hour(s) 2.5 hour(s)  Hemodialysis Intake (mL) 0 mL  Liters Processed 37.5  Fluid Removed (mL) 1000 mL  Tolerated HD Treatment Yes  Post-Hemodialysis Comments HD tx Achieved as expected, tolerated well, no complaints, pt is stable.  AVG/AVF Arterial Site Held (minutes) 0 minutes  AVG/AVF Venous Site Held (minutes) 0 minutes  Hemodialysis Catheter Right Internal jugular Double lumen Permanent (Tunneled)  Placement Date/Time: 03/05/23 1114   Serial / Lot #: [id number]  Expiration Date: 01/16/27  Time Out: Correct patient;Correct site;Correct procedure  Maximum sterile barrier precautions: Hand hygiene;Cap;Mask;Sterile gown;Sterile gloves;Large sterile ...  Site Condition No complications  Blue Lumen Status Flushed;Heparin locked;Dead end cap in place  Red Lumen Status Flushed;Heparin locked;Dead end cap in place  Purple Lumen Status N/A  Catheter fill solution Heparin 1000 units/ml  Catheter fill volume (Arterial) 1.9 cc  Catheter fill volume (Venous) 1.9  Dressing Type Transparent  Dressing Status Antimicrobial disc in place  Drainage Description None  Post  treatment catheter status Capped and Clamped

## 2023-03-07 NOTE — Transfer of Care (Signed)
Immediate Anesthesia Transfer of Care Note  Patient: George Day  Procedure(s) Performed: CANCELED PROCEDURE (Left)  Patient Location: PACU  Anesthesia Type:General  Level of Consciousness: awake, alert , and oriented  Airway & Oxygen Therapy: Patient Spontanous Breathing  Post-op Assessment: Report given to RN and Post -op Vital signs reviewed and stable  Post vital signs: Reviewed and stable  Last Vitals:  Vitals Value Taken Time  BP 156/62 03/07/23 1701  Temp 36.8 C 03/07/23 1700  Pulse 72 03/07/23 1713  Resp 18 03/07/23 1713  SpO2 93 % 03/07/23 1713  Vitals shown include unfiled device data.  Last Pain:  Vitals:   03/07/23 1700  TempSrc:   PainSc: 0-No pain      Patients Stated Pain Goal: 0 (03/05/23 2234)  Complications: No notable events documented.

## 2023-03-07 NOTE — Anesthesia Procedure Notes (Signed)
Procedure Name: Intubation Date/Time: 03/07/2023 4:12 PM  Performed by: Macie Burows, CRNAPre-anesthesia Checklist: Patient identified, Emergency Drugs available, Suction available and Patient being monitored Patient Re-evaluated:Patient Re-evaluated prior to induction Oxygen Delivery Method: Circle system utilized Preoxygenation: Pre-oxygenation with 100% oxygen Induction Type: IV induction Ventilation: Mask ventilation without difficulty Laryngoscope Size: Mac and 3 Grade View: Grade II Tube type: Oral Tube size: 7.5 mm Number of attempts: 1 Airway Equipment and Method: Stylet and Oral airway Placement Confirmation: ETT inserted through vocal cords under direct vision, positive ETCO2 and breath sounds checked- equal and bilateral Secured at: 22 cm Tube secured with: Tape Comments: Scant blood from oropharynx noted after DL x3 (1 Miller 2, 1 Miller 3, 1 Mac3)

## 2023-03-07 NOTE — Progress Notes (Signed)
New Dialysis Start    Patient identified as new dialysis start. Kidney Education packet assembled and given. Discussed the following items with patient:     Current medications and possible changes once started:  Discussed that patient's medications may change over time.  Ex; hypertension medications and diabetes medication.  Nephrologists will adjust as needed.   Fluid restrictions reviewed:  32 oz daily goal:  All liquids count; soups, ice, jello, fruits. Will also refer dietitian.   Phosphorus and potassium: Handout given showing high potassium and phosphorus foods.  Alternative food and drink options given. Will also refer dietitian.   Family support:  Wife at bedside, very supportive and inquisitive about the process.   Outpatient Clinic Resources:  Discussed roles of Outpatient clinic staff and advised to make a list of needs, if any, to talk with outpatient staff if needed.   Care plan schedule: Informed patient of Care Plans in outpatient setting and to participate in the care plan.  An invitation would be given from outpatient clinic.    Dialysis Access Options:  Reviewed access options with patients. Discussed in detail about care at home with new AVG & AVF. Reviewed checking bruit and thrill. If dialysis catheter present, educated that patient could not take showers.  Catheter dressing changes were to be done by outpatient clinic staff only. Patient scheduled to get AVG today.    Home therapy options:  Educated patient about home therapy options:  PD vs home hemo.  Patient going to TCU, very appropriate, and excited about the process.    Patient also verbalized that he was interested in kidney transplant. Encouraged him to start the process. Patient verbalized understanding of all information shared. Will continue to round on patient during admission.    Jean Rosenthal Dialysis Nurse Coordinator [phone]

## 2023-03-07 NOTE — Anesthesia Preprocedure Evaluation (Signed)
Anesthesia Evaluation  Patient identified by MRN, date of birth, ID band Patient awake    Reviewed: Allergy & Precautions, NPO status , Patient's Chart, lab work & pertinent test results, reviewed documented beta blocker date and time   History of Anesthesia Complications Negative for: history of anesthetic complications  Airway Mallampati: II  TM Distance: >3 FB Neck ROM: Full    Dental  (+) Teeth Intact, Dental Advisory Given   Pulmonary neg shortness of breath, sleep apnea and Continuous Positive Airway Pressure Ventilation , COPD, neg recent URI, former smoker   breath sounds clear to auscultation       Cardiovascular hypertension, Pt. on medications and Pt. on home beta blockers (-) angina + CAD, + Past MI and +CHF   Rhythm:Regular  1. Left ventricular ejection fraction, by estimation, is 50 to 55%. The  left ventricle has low normal function. The left ventricle has no regional  wall motion abnormalities. There is severe concentric left ventricular  hypertrophy. Left ventricular  diastolic parameters are consistent with Grade III diastolic dysfunction  (restrictive).   2. Right ventricular systolic function is normal. The right ventricular  size is normal. Tricuspid regurgitation signal is inadequate for assessing  PA pressure.   3. Left atrial size was moderately dilated.   4. A large pericardial effusion is present and measures largest around  the RA best visualized on subcostal views. The pericardial effusion is  circumferential. There is no evidence of cardiac tamponade.   5. The mitral valve is grossly normal. Mild mitral valve regurgitation.   6. The aortic valve is tricuspid. Aortic valve regurgitation is not  visualized. Aortic valve sclerosis is present, with no evidence of aortic  valve stenosis.   7. The inferior vena cava is dilated in size with <50% respiratory  variability, suggesting right atrial pressure of  15 mmHg.    2022 stress:  The left ventricular ejection fraction is moderately decreased (30-44%).  Nuclear stress EF: 42%.  ST segment depression was noted during stress in the II, III, aVF and V6 leads, and returning to baseline after 1-5 minutes of recovery.  No evidence of ischemia or infarct on nuclear perfusion study.  This is an intermediate risk study due to moderately reduced LVEF.   Laurance Flatten, MD    Neuro/Psych  PSYCHIATRIC DISORDERS Anxiety     CVA in 2010 with MI CVA, No Residual Symptoms    GI/Hepatic negative GI ROS, Neg liver ROS,,,  Endo/Other  diabetes    Renal/GU ESRF and DialysisRenal diseaseLab Results      Component                Value               Date                      NA                       131 (L)             03/07/2023                K                        3.1 (L)             03/07/2023  CO2                      25                  03/07/2023                GLUCOSE                  169 (H)             03/07/2023                BUN                      31 (H)              03/07/2023                CREATININE               4.50 (H)            03/07/2023                CALCIUM                  8.3 (L)             03/07/2023                GFR                      25.35 (L)           02/15/2021                EGFR                     14 (L)              03/01/2023                GFRNONAA                 15 (L)              03/07/2023                Musculoskeletal   Abdominal   Peds  Hematology  (+) Blood dyscrasia, anemia Lab Results      Component                Value               Date                      WBC                      4.8                 03/06/2023                HGB                      9.1 (L)             03/06/2023                HCT  29.0 (L)            03/06/2023                MCV                      68.7 (L)            03/06/2023                PLT                       200                 03/06/2023              Anesthesia Other Findings Large tongue  Reproductive/Obstetrics                              Anesthesia Physical Anesthesia Plan  ASA: 3  Anesthesia Plan: General   Post-op Pain Management: Ofirmev IV (intra-op)*   Induction: Intravenous  PONV Risk Score and Plan: 2 and Ondansetron and Dexamethasone  Airway Management Planned: Oral ETT  Additional Equipment: None  Intra-op Plan:   Post-operative Plan: Extubation in OR  Informed Consent: I have reviewed the patients History and Physical, chart, labs and discussed the procedure including the risks, benefits and alternatives for the proposed anesthesia with the patient or authorized representative who has indicated his/her understanding and acceptance.     Dental advisory given  Plan Discussed with: CRNA  Anesthesia Plan Comments:          Anesthesia Quick Evaluation

## 2023-03-07 NOTE — Progress Notes (Signed)
Admit: 03/03/2023 LOS: 4  37M new ESRD with uremia  Subjective:  HD #2 yesterday For AV access today CLIP completed to [facility] TCU Interested in HHD Wife / family at bedside, questions answered  07/17 0701 - 07/18 0700 In: 1077 [P.O.:1077] Out: 3025 [Urine:2025]  Filed Weights   03/03/23 2336  Weight: 92 kg    Scheduled Meds:  amLODipine  10 mg Oral Daily   aspirin EC  81 mg Oral Daily   atorvastatin  40 mg Oral Daily   carvedilol  50 mg Oral BID   Chlorhexidine Gluconate Cloth  6 each Topical Daily   Chlorhexidine Gluconate Cloth  6 each Topical Daily   darbepoetin (ARANESP) injection - DIALYSIS  100 mcg Subcutaneous Q Tue-1800   docusate sodium  100 mg Oral BID   famotidine  20 mg Oral Daily   feeding supplement  237 mL Oral BID BM   fluticasone furoate-vilanterol  1 puff Inhalation Daily   And   umeclidinium bromide  1 puff Inhalation Daily   heparin  5,000 Units Subcutaneous Q8H   isosorbide-hydrALAZINE  1 tablet Oral TID   multivitamin  1 tablet Oral QHS   mupirocin ointment  1 Application Nasal BID   potassium chloride SA  20 mEq Oral Daily   torsemide  40 mg Oral Daily   Continuous Infusions:  ferumoxytol (FERAHEME) 510 mg in sodium chloride 0.9 % 100 mL IVPB     PRN Meds:.acetaminophen, HYDROmorphone, mouth rinse  Current Labs: reviewed    Physical Exam:  Blood pressure (!) 164/89, pulse 66, temperature 98.4 F (36.9 C), resp. rate 17, weight 92 kg, SpO2 100%. GEN: NAD, Sitting at the edge of the bed finishing lunch ENT: NCAT EYES: EOMI CV: Regular, no rub PULM: Clear bilaterally, normal work of breathing ABD: Soft, nontender SKIN: No rashes or lesions EXT: No significant edema NEURO: AAO x4, no asterixus,   A New ESRD with uremia Start iHD: HD #1 7/16 R internal jugular TDC placed 7/16 with IR CLIP to TCU complete AVF/G today HTN, longstanding and uncontrolled with dialysis.  No immediate changes to medications; trend with HD and UF;  prob would  add on ARB as next agent; good UOP cont diuretic and KCl for now Microcytic anemia with features of iron deficiency and CKD: Receiving IV iron and started aranesp 100 qTues Trace pericardial effusion likely due to ESRD, doubt hemodynamically significant. Will improve with HD DM2 History of adrenal mass, outpatient management  P AV access today Then can DC and will go to TCU tomorrow for ongoing care Medication Issues; Preferred narcotic agents for pain control are hydromorphone, fentanyl, and methadone. Morphine should not be used.  Baclofen should be avoided Avoid oral sodium phosphate and magnesium citrate based laxatives / bowel preps    Sabra Heck MD 03/07/2023, 11:34 AM  Recent Labs  Lab 03/05/23 0104 03/06/23 0311 03/07/23 0044  NA 134* 133* 131*  K 3.4* 3.4* 3.1*  CL 105 101 97*  CO2 22 23 25   GLUCOSE 100* 89 169*  BUN 54* 43* 31*  CREATININE 5.97* 5.29* 4.50*  CALCIUM 8.3* 8.5* 8.3*  PHOS 4.5 4.8* 2.8   Recent Labs  Lab 03/03/23 1912 03/04/23 0135 03/05/23 0104 03/06/23 0311  WBC 6.7 6.0 5.2 4.8  NEUTROABS 4.9  --   --   --   HGB 9.3* 8.8* 9.0* 9.1*  HCT 29.1* 27.8* 27.5* 29.0*  MCV 69.5* 69.3* 69.1* 68.7*  PLT 228 225 200 200

## 2023-03-07 NOTE — Progress Notes (Signed)
[facility] [place] PROGRESS NOTE  ASSESSMENT / PLAN: George Day is a 47 y.o. male with ESRD; needs permanent dialysis access. Plan L arm arteriovenous graft today in OR.   SUBJECTIVE: No complaints. Ready for OR. Reviewed OR plan.  OBJECTIVE: BP (!) 164/89 (BP Location: Right Arm)   Pulse 66   Temp 98.4 F (36.9 C)   Resp 17   Ht 5\' 9"  (1.753 m)   Wt 92 kg   SpO2 100%   BMI 29.95 kg/m   Intake/Output Summary (Last 24 hours) at 03/07/2023 1523 Last data filed at 03/07/2023 0600 Gross per 24 hour  Intake 597 ml  Output 2175 ml  Net -1578 ml    Well appearing man in no distress Regular rate and rhythm Unlabored breathing 2+ radial pulses     Latest Ref Rng & Units 03/06/2023    3:11 AM 03/05/2023    1:04 AM 03/04/2023    1:35 AM  CBC  WBC 4.0 - 10.5 K/uL 4.8  5.2  6.0   Hemoglobin 13.0 - 17.0 g/dL 9.1  9.0  8.8   Hematocrit 39.0 - 52.0 % 29.0  27.5  27.8   Platelets 150 - 400 K/uL 200  200  225         Latest Ref Rng & Units 03/07/2023   12:44 AM 03/06/2023    3:11 AM 03/05/2023    1:04 AM  CMP  Glucose 70 - 99 mg/dL 782  89  956   BUN 6 - 20 mg/dL 31  43  54   Creatinine 0.61 - 1.24 mg/dL 2.13  0.86  5.78   Sodium 135 - 145 mmol/L 131  133  134   Potassium 3.5 - 5.1 mmol/L 3.1  3.4  3.4   Chloride 98 - 111 mmol/L 97  101  105   CO2 22 - 32 mmol/L 25  23  22    Calcium 8.9 - 10.3 mg/dL 8.3  8.5  8.3     Estimated Creatinine Clearance: 22.7 mL/min (A) (by C-G formula based on SCr of 4.5 mg/dL (H)).  Rande Brunt. Lenell Antu, MD [facility] Phone Number: [phone] 03/07/2023 3:23 PM

## 2023-03-07 NOTE — Discharge Summary (Addendum)
Name: George Day MRN: [id number] DOB: 22-Mar-1976 47 y.o. PCP: [name], MD  Date of Admission: 03/03/2023  6:58 PM Date of Discharge: 03/07/2023 Attending Physician: Dr.  Sol Blazing  Discharge Diagnosis: CKDV progressing to ESRD Iron Defiency Anemia HTN Hypokalemia Hidradenitis Suppurativa HFpEF HLD/CAD/Hx of CVA T2DM Asthma/COPD  Discharge Medications: Allergies as of 03/07/2023       Reactions   Honey Bee Venom [bee Venom] Anaphylaxis   Penicillins Anaphylaxis   ALL   Tomato Swelling        Medication List     TAKE these medications    amLODipine 10 MG tablet Commonly known as: NORVASC Take 1 tablet (10 mg total) by mouth daily.   aspirin EC 81 MG tablet Take 1 tablet (81 mg total) by mouth daily.   atorvastatin 40 MG tablet Commonly known as: LIPITOR Take 1 tablet (40 mg total) by mouth daily.   carvedilol 25 MG tablet Commonly known as: COREG Take 2 tablets (50 mg total) by mouth 2 (two) times daily.   doxycycline 100 MG tablet Commonly known as: ADOXA Take 1 tablet (100 mg total) by mouth 2 (two) times daily.   doxycycline 100 MG tablet Commonly known as: VIBRA-TABS Take 1 tablet (100 mg total) by mouth every 12 (twelve) hours for 14 days.   famotidine 20 MG tablet Commonly known as: PEPCID Take 1 tablet (20 mg total) by mouth daily.   isosorbide-hydrALAZINE 20-37.5 MG tablet Commonly known as: BIDIL Take 1 tablet by mouth 3 (three) times daily.   nitroGLYCERIN 0.6 MG SL tablet Commonly known as: NITROSTAT Place 1 tablet (0.6 mg total) under the tongue every 5 (five) minutes as needed for chest pain.   potassium chloride SA 20 MEQ tablet Commonly known as: KLOR-CON M Take 1 tablet (20 mEq total) by mouth daily.   torsemide 20 MG tablet Commonly known as: DEMADEX Take 2 tablets (40 mg total) by mouth daily.   Trelegy Ellipta 100-62.5-25 MCG/ACT Aepb Generic drug: Fluticasone-Umeclidin-Vilant Inhale 1 puff into the lungs  daily.        Disposition and follow-up:   [name] was discharged from [facility] in Stable condition.  At the hospital follow up visit please address:  1.  Follow-up:  A. CKD stage 5, initiated hemodialysis this hospitalization. Ensure he has followed with vascular surgery.     B. MS: patient supposed to see neurology outpatient and has an appointment set up, but hasn't been seen yet    C. Iron deficiency anemia: gave two doses of Feraheme during admission, please follow up iron levels at follow-up   D. HTN: Ensure pt has good bp control. If hypotensive, decrease BP regimen as pt is on HD.   E. Hidradenitis Suppurativa: Ensure pt completes abx course and axilla without any signs of infection.   2.  Labs / imaging needed at time of follow-up: BMP, CBC  3.  Pending labs/ test needing follow-up: none  4.  Medication Changes  Started: Darbepoetin Alfa (ARANESP) injection weekly    Follow-up Appointments: 7/31 at 2:15pm at Internal Medicine Clinic with Dr. Daiva Eves 10/2 at 12:30pm Echo at [facility] CV Imaging on [facility] 10/3 at 1:30pm at [facility] at [facility] with Bernadene Person, NP    Follow-up Information     Center, [facility]. Go on 03/08/2023.   Why: Transitional Care Unit- Monday, Tuesday, Thursday,Friday with 7:30 am chair time.  On Friday, please arrive at 7:00 am to complete paperwork prior  to treatment. Contact information: [address] [place] [address] [phone]         Morene Crocker, MD. Go to.   Specialty: Internal Medicine Why: 03/20/23 2:00 pm Contact information: [address] [place] [address] [phone]         [facility] Follow up in 2 week(s).   Why: The office will call the patient with an appointment Contact information: [address] [place] [address] [phone]                Hospital Course by problem list:   CKD stage 5 progressing ESRD Concern for progression of CKD to stage 5 with GFR 12. He had uremic symptoms that have progressed, acute drop in UOP, and end-stage renal disease with prior discussions to start HD. Nephrology recommended starting HD inpatient, IR and vascular surgery contacted to place tunneled catheter and create fistula in LUE. Patient started HD 7/16. Vascular surgery attempted to place AV with graft 7/18 but was unsuccessful due to findings of HS (see below). He will need outpatient vascular follow up and the vascular team is to arrange that. Patient started weekly EPO stimulating agent. Patient has been accepted at TCU at [facility] for outpatient HD on Mondays, Tuesdays, Thursdays, and Fridays with 7:30 am chair time.  Iron deficiency anemia MCV has showed a microcytic anemia that seems to be chronic. Ferritin and iron is low, replaced iron with Feraheme two doses. Suspect his anemia is also multifactorial due to CKD, could also be anemia of chronic disease. Has had colonoscopy as recent is 07/2022 with recommendation for 10 year follow up.  HTN Home regimen includes amlodipine 10 mg daily, carvedilol 25 mg BID, isosorbide-hydralazine 20-37.5 mg TID, torsemide 40 mg daily. Continued all home meds during hospitalization.    Hidradenitis Suppurativa: Found during pt's graft procedure. Pt started on Doxycycline for 2 weeks. He will need to follow up with vascular surgery in 2 weeks.   History of asthma No respiratory symptoms at this time. Home regimen is trelegy ellipta.  History of MI s/p cardiac catheterization in 2010 History of CVA CAD No residual deficits. On ASA 81 mg daily, atorvastatin 40 mg daily. Most recent lipid panel 3 months ago with LDL just above goal at 74.  T2DM Last HbA1c 3 months ago of 6.0%. Not on outpatient therapy for this.   Multiple sclerosis Recent diagnosis. Has not been able to get in with neurology yet.   HFpEF Echocardiogram 01/2023 showed LV EF  50-55% with severe concentric LVH, moderately dilated LA. Clinically euvolemic. Has had decreased UOP since discharge from last admission, concerning in setting of CKD stage 5 for risk of volume overload. Outpatient regimen includes torsemide 40 mg daily.   Right Adrenal Mass Noted on CT, stable, previously referred to endocrinology for surgical removal.  Recommend outpatient follow up.   Discharge Subjective: Patient feels well and is agreeable with going home. He denies any pain and understands he will start dialysis the day after discharge at [facility].  Discharge Exam:   BP (!) 144/83 (BP Location: Right Arm)   Pulse 77   Temp 98 F (36.7 C)   Resp (!) 21   Ht 5\' 9"  (1.753 m)   Wt 92 kg   SpO2 92%   BMI 29.95 kg/m  Constitutional: well-appearing male sitting in chair, in no acute distress HENT: normocephalic atraumatic Eyes: conjunctiva non-erythematous Neck: supple Cardiovascular: regular rate and rhythm, no m/r/g Pulmonary/Chest: normal  work of breathing on room air, lungs clear to auscultation bilaterally Abdominal: soft, non-tender MSK: normal bulk and tone Neurological: alert & oriented x 3 Psych: normal mood and affect   Pertinent Labs, Studies, and Procedures:     Latest Ref Rng & Units 03/06/2023    3:11 AM 03/05/2023    1:04 AM 03/04/2023    1:35 AM  CBC  WBC 4.0 - 10.5 K/uL 4.8  5.2  6.0   Hemoglobin 13.0 - 17.0 g/dL 9.1  9.0  8.8   Hematocrit 39.0 - 52.0 % 29.0  27.5  27.8   Platelets 150 - 400 K/uL 200  200  225        Latest Ref Rng & Units 03/07/2023   12:44 AM 03/06/2023    3:11 AM 03/05/2023    1:04 AM  CMP  Glucose 70 - 99 mg/dL 161  89  096   BUN 6 - 20 mg/dL 31  43  54   Creatinine 0.61 - 1.24 mg/dL 0.45  4.09  8.11   Sodium 135 - 145 mmol/L 131  133  134   Potassium 3.5 - 5.1 mmol/L 3.1  3.4  3.4   Chloride 98 - 111 mmol/L 97  101  105   CO2 22 - 32 mmol/L 25  23  22    Calcium 8.9 - 10.3 mg/dL 8.3  8.5  8.3     CT Renal  Stone Study  Result Date: 03/03/2023 CLINICAL DATA:  Flank pain EXAM: CT ABDOMEN AND PELVIS WITHOUT CONTRAST  IMPRESSION: 1. Negative for hydronephrosis or ureteral stone. 2. Cardiomegaly with trace pericardial effusion. 3. 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 Aortic atherosclerosis. Aortic Atherosclerosis (ICD10-I70.0). Electronically Signed   By: Jasmine Pang M.D.   On: 03/03/2023 19:31     Discharge Instructions: Discharge Instructions     Call MD for:  difficulty breathing, headache or visual disturbances   Complete by: As directed    Call MD for:  extreme fatigue   Complete by: As directed    Call MD for:  hives   Complete by: As directed    Call MD for:  persistant dizziness or light-headedness   Complete by: As directed    Call MD for:  persistant nausea and vomiting   Complete by: As directed    Call MD for:  redness, tenderness, or signs of infection (pain, swelling, redness, odor or green/yellow discharge around incision site)   Complete by: As directed    Call MD for:  severe uncontrolled pain   Complete by: As directed    Call MD for:  temperature >100.4   Complete by: As directed    Diet - low sodium heart healthy   Complete by: As directed    Increase activity slowly   Complete by: As directed    No wound care   Complete by: As directed         Discharge Instructions      You were hospitalized for end stage kidney disease. Thank you for allowing [place] to be part of your care.   We arranged for you to follow up at:  7/31 at 2:15pm at Internal Medicine Clinic with Dr. Daiva Eves 10/2 at 12:30pm Echo at [facility] CV Imaging on [facility] 10/3 at 1:30pm at [facility] at [facility] with Bernadene Person, NP   Please note these changes made  to your medications:   *Please START taking:  Darbepoetin Alfa (ARANESP) injection  once a week  *Please start Doxycyline for your infection. You need to complete the full course. Follow up with vascular surgeon in the office to discuss the plan for fistula.   *Please CONTINUE taking:  Amlodipine 10 mg once a day ASA 81 mg once a day Atorvastatin 40 mg once a day Carvedilol 50 mg twice a day Famotidine 20 mg once a day Trelegy ellipta 1 puff daily Isosorbide-hydralazine 20-37.5 mg three times a day Potassium Chloride 20 mEq once a day Torsemide 40 mg once a day     Please try your best to make it to all your dialysis appointments.  Please make sure to come back to the emergency room if you experience fever, shortness of breath, dizziness, lightheadedness, increasing lower back pain, or your fistula site on your arm is getting red, swollen, hot, and painful. Do not take any ibuprofen, Aleve, naproxen as these medications can be harmful to your kidneys. Try to limit your salt intake.  Please call our clinic if you have any questions or concerns, we may be able to help and keep you from a long and expensive emergency room wait. Our clinic and after hours phone number is [phone], the best time to call is Monday through Friday 9 am to 4 pm but there is always someone available 24/7 if you have an emergency. If you need medication refills please notify your pharmacy one week in advance and they will send [place] a request.         Signed: Gwenevere Abbot, MD 03/07/2023, 7:36 PM   Pager: [phone]

## 2023-03-07 NOTE — Discharge Instructions (Addendum)
You were hospitalized for end stage kidney disease. Thank you for allowing [place] to be part of your care.   We arranged for you to follow up at:  7/31 at 2:15pm at Internal Medicine Clinic with Dr. Daiva Eves 10/2 at 12:30pm Echo at [facility] CV Imaging on [facility] 10/3 at 1:30pm at [facility] at [facility] with Bernadene Person, NP   Please note these changes made to your medications:   *Please START taking:  Darbepoetin Alfa (ARANESP) injection once a week  *Please start Doxycyline for your infection. You need to complete the full course. Follow up with vascular surgeon in the office to discuss the plan for fistula.   *Please CONTINUE taking:  Amlodipine 10 mg once a day ASA 81 mg once a day Atorvastatin 40 mg once a day Carvedilol 50 mg twice a day Famotidine 20 mg once a day Trelegy ellipta 1 puff daily Isosorbide-hydralazine 20-37.5 mg three times a day Potassium Chloride 20 mEq once a day Torsemide 40 mg once a day     Please try your best to make it to all your dialysis appointments.  Please make sure to come back to the emergency room if you experience fever, shortness of breath, dizziness, lightheadedness, increasing lower back pain, or your fistula site on your arm is getting red, swollen, hot, and painful. Do not take any ibuprofen, Aleve, naproxen as these medications can be harmful to your kidneys. Try to limit your salt intake.  Please call our clinic if you have any questions or concerns, we may be able to help and keep you from a long and expensive emergency room wait. Our clinic and after hours phone number is [phone], the best time to call is Monday through Friday 9 am to 4 pm but there is always someone available 24/7 if you have an emergency. If you need medication refills please notify your pharmacy one week in advance and they will send [place] a request.

## 2023-03-08 ENCOUNTER — Other Ambulatory Visit (HOSPITAL_COMMUNITY): Payer: Self-pay

## 2023-03-08 ENCOUNTER — Telehealth: Payer: Self-pay | Admitting: Physician Assistant

## 2023-03-08 ENCOUNTER — Other Ambulatory Visit: Payer: Self-pay

## 2023-03-08 ENCOUNTER — Telehealth: Payer: Self-pay

## 2023-03-08 DIAGNOSIS — N186 End stage renal disease: Secondary | ICD-10-CM | POA: Diagnosis not present

## 2023-03-08 DIAGNOSIS — Z992 Dependence on renal dialysis: Secondary | ICD-10-CM | POA: Diagnosis not present

## 2023-03-08 NOTE — Telephone Encounter (Signed)
Transition of care contact from inpatient facility  Date of Discharge: 03/07/23 Date of Contact: 03/08/23 Method of contact: Phone  Attempted to contact patient to discuss transition of care from inpatient admission. Patient did not answer the phone. Unable to leave voicemail. RN will follow up with patient at HD today.  Rogers Blocker, PA-C 03/08/2023, 2:07 PM  [facility] Pager: [phone]

## 2023-03-08 NOTE — Transitions of Care (Post Inpatient/ED Visit) (Signed)
   03/08/2023  Name: George Day MRN: [id number] DOB: 1975-12-14  Today's TOC FU Call Status: Today's TOC FU Call Status:: Unsuccessul Call (1st Attempt) TOC FU Call Complete Date: 03/08/23  Attempted to reach the patient regarding the most recent Inpatient/ED visit.  Follow Up Plan: Additional outreach attempts will be made to reach the patient to complete the Transitions of Care (Post Inpatient/ED visit) call.   Jodelle Gross, RN, BSN, CCM Care Management Coordinator [facility]

## 2023-03-09 NOTE — Anesthesia Postprocedure Evaluation (Signed)
Anesthesia Post Note  Patient: George Day  Procedure(s) Performed: CANCELED PROCEDURE (Left)     Patient location during evaluation: PACU Anesthesia Type: General Level of consciousness: awake and alert Pain management: pain level controlled Vital Signs Assessment: post-procedure vital signs reviewed and stable Respiratory status: spontaneous breathing, nonlabored ventilation and respiratory function stable Cardiovascular status: blood pressure returned to baseline and stable Postop Assessment: no apparent nausea or vomiting Anesthetic complications: no   No notable events documented.  Last Vitals:  Vitals:   03/07/23 1715 03/07/23 1730  BP: (!) 148/81 (!) 144/83  Pulse: 75 77  Resp: 17 (!) 21  Temp:  36.7 C  SpO2: 93% 92%    Last Pain:  Vitals:   03/07/23 1730  TempSrc:   PainSc: 0-No pain                 Merna Baldi

## 2023-03-11 ENCOUNTER — Telehealth: Payer: Self-pay

## 2023-03-11 DIAGNOSIS — N186 End stage renal disease: Secondary | ICD-10-CM | POA: Diagnosis not present

## 2023-03-11 DIAGNOSIS — D631 Anemia in chronic kidney disease: Secondary | ICD-10-CM | POA: Diagnosis not present

## 2023-03-11 DIAGNOSIS — E8770 Fluid overload, unspecified: Secondary | ICD-10-CM | POA: Diagnosis not present

## 2023-03-11 DIAGNOSIS — Z992 Dependence on renal dialysis: Secondary | ICD-10-CM | POA: Diagnosis not present

## 2023-03-11 NOTE — Transitions of Care (Post Inpatient/ED Visit) (Signed)
03/11/2023  Name: George Day MRN: [id number] DOB: 06-06-1976  Today's TOC FU Call Status: Today's TOC FU Call Status:: Successful TOC FU Call Competed TOC FU Call Complete Date: 03/11/23  Transition Care Management Follow-up Telephone Call Date of Discharge: 03/07/23 Discharge Facility: [facility]) Type of Discharge: Inpatient Admission Primary Inpatient Discharge Diagnosis:: ESRD How have you been since you were released from the hospital?: Better (Patient noted that dialysis is going well, he is hungry.) Any questions or concerns?: No  Items Reviewed: Did you receive and understand the discharge instructions provided?: Yes Medications obtained,verified, and reconciled?: Yes (Medications Reviewed) Any new allergies since your discharge?: No Dietary orders reviewed?: No Do you have support at home?: Yes Name of Support/Comfort Primary Source: [name]  Medications Reviewed Today: Medications Reviewed Today     Reviewed by Jodelle Gross, RN (Case Manager) on 03/11/23 at 1056  Med List Status: <None>   Medication Order Taking? Sig Documenting Provider Last Dose Status Informant  amLODipine (NORVASC) 10 MG tablet [id number] Yes Take 1 tablet (10 mg total) by mouth daily. Ngetich, Donalee Citrin, NP Taking Active Self, Pharmacy Records  aspirin EC 81 MG tablet [id number] Yes Take 1 tablet (81 mg total) by mouth daily. Ngetich, Donalee Citrin, NP Taking Active Self, Pharmacy Records  atorvastatin (LIPITOR) 40 MG tablet [id number] Yes Take 1 tablet (40 mg total) by mouth daily. Ngetich, Donalee Citrin, NP Taking Active Self, Pharmacy Records  carvedilol (COREG) 25 MG tablet [id number] Yes Take 2 tablets (50 mg total) by mouth 2 (two) times daily. Ngetich, Donalee Citrin, NP Taking Active Self, Pharmacy Records  doxycycline (VIBRA-TABS) 100 MG tablet [id number] No Take 1 tablet (100 mg total) by mouth 2 (two) times daily.  Patient not taking: Reported on 03/11/2023   Gwenevere Abbot, MD Not Taking Active             Med Note Electa Sniff [facility]   Mon Mar 11, 2023 10:55 AM) Patient was unable to pick up until today due to pharmacy computer issue  doxycycline (VIBRA-TABS) 100 MG tablet [id number] No Take 1 tablet (100 mg total) by mouth every 12 (twelve) hours for 14 days.  Patient not taking: Reported on 03/11/2023   Gwenevere Abbot, MD Not Taking Active   famotidine (PEPCID) 20 MG tablet [id number] Yes Take 1 tablet (20 mg total) by mouth daily. Ngetich, Donalee Citrin, NP Taking Active Self, Pharmacy Records  Fluticasone-Umeclidin-Vilant [facility] ELLIPTA) 100-62.5-25 MCG/ACT AEPB [id number] Yes Inhale 1 puff into the lungs daily. Ngetich, Donalee Citrin, NP Taking Active Self, Pharmacy Records  isosorbide-hydrALAZINE (BIDIL) 20-37.5 MG tablet [id number] Yes Take 1 tablet by mouth 3 (three) times daily. Arrien, [place] Ram, MD Taking Active Self, Pharmacy Records  nitroGLYCERIN (NITROSTAT) 0.6 MG SL tablet [id number]  Place 1 tablet (0.6 mg total) under the tongue every 5 (five) minutes as needed for chest pain. Ngetich, Donalee Citrin, NP  Active Self, Pharmacy Records  potassium chloride SA (KLOR-CON M) 20 MEQ tablet [id number] Yes Take 1 tablet (20 mEq total) by mouth daily. Arrien, [place] Ram, MD Taking Active Self, Pharmacy Records  torsemide [facility]) 20 MG tablet [id number] Yes Take 2 tablets (40 mg total) by mouth daily. Arrien, [place] Ram, MD Taking Active Self, Pharmacy Records            Home Care and Equipment/Supplies: Were Home Health Services Ordered?: No Any new equipment or medical supplies ordered?: No  Functional Questionnaire: Do you need assistance with bathing/showering or dressing?: No Do you need assistance with  meal preparation?: No Do you need assistance with eating?: No Do you have difficulty maintaining continence: No Do you need assistance with getting out of bed/getting out of a chair/moving?: No Do you have difficulty managing or taking your medications?: No  Follow up appointments  reviewed: PCP Follow-up appointment confirmed?: Yes Date of PCP follow-up appointment?: 03/20/23 Follow-up Provider: Dr. Rosaura Carpenter Specialist [facility] Follow-up appointment confirmed?: No Reason Specialist Follow-Up Not Confirmed: Patient has Specialist Provider Number and will Call for Appointment Do you need transportation to your follow-up appointment?: No Do you understand care options if your condition(s) worsen?: Yes-patient verbalized understanding  SDOH Interventions Today    Flowsheet Row Most Recent Value  SDOH Interventions   Food Insecurity Interventions Intervention Not Indicated  Housing Interventions Intervention Not Indicated      Jodelle Gross, RN, BSN, CCM Care Management Coordinator [facility] Phone: [phone]/Fax: [phone]

## 2023-03-12 DIAGNOSIS — E8770 Fluid overload, unspecified: Secondary | ICD-10-CM | POA: Diagnosis not present

## 2023-03-12 DIAGNOSIS — N186 End stage renal disease: Secondary | ICD-10-CM | POA: Diagnosis not present

## 2023-03-12 DIAGNOSIS — D631 Anemia in chronic kidney disease: Secondary | ICD-10-CM | POA: Diagnosis not present

## 2023-03-12 DIAGNOSIS — Z992 Dependence on renal dialysis: Secondary | ICD-10-CM | POA: Diagnosis not present

## 2023-03-13 ENCOUNTER — Other Ambulatory Visit: Payer: Self-pay

## 2023-03-14 DIAGNOSIS — N186 End stage renal disease: Secondary | ICD-10-CM | POA: Diagnosis not present

## 2023-03-14 DIAGNOSIS — E8770 Fluid overload, unspecified: Secondary | ICD-10-CM | POA: Diagnosis not present

## 2023-03-14 DIAGNOSIS — D631 Anemia in chronic kidney disease: Secondary | ICD-10-CM | POA: Diagnosis not present

## 2023-03-14 DIAGNOSIS — Z992 Dependence on renal dialysis: Secondary | ICD-10-CM | POA: Diagnosis not present

## 2023-03-15 DIAGNOSIS — Z992 Dependence on renal dialysis: Secondary | ICD-10-CM | POA: Diagnosis not present

## 2023-03-15 DIAGNOSIS — N186 End stage renal disease: Secondary | ICD-10-CM | POA: Diagnosis not present

## 2023-03-15 DIAGNOSIS — E8770 Fluid overload, unspecified: Secondary | ICD-10-CM | POA: Diagnosis not present

## 2023-03-15 DIAGNOSIS — D631 Anemia in chronic kidney disease: Secondary | ICD-10-CM | POA: Diagnosis not present

## 2023-03-18 DIAGNOSIS — Z992 Dependence on renal dialysis: Secondary | ICD-10-CM | POA: Diagnosis not present

## 2023-03-18 DIAGNOSIS — N186 End stage renal disease: Secondary | ICD-10-CM | POA: Diagnosis not present

## 2023-03-18 DIAGNOSIS — E8770 Fluid overload, unspecified: Secondary | ICD-10-CM | POA: Diagnosis not present

## 2023-03-19 DIAGNOSIS — N186 End stage renal disease: Secondary | ICD-10-CM | POA: Diagnosis not present

## 2023-03-19 DIAGNOSIS — Z992 Dependence on renal dialysis: Secondary | ICD-10-CM | POA: Diagnosis not present

## 2023-03-19 DIAGNOSIS — E8770 Fluid overload, unspecified: Secondary | ICD-10-CM | POA: Diagnosis not present

## 2023-03-20 ENCOUNTER — Encounter: Payer: 59 | Admitting: Student

## 2023-03-20 DIAGNOSIS — N186 End stage renal disease: Secondary | ICD-10-CM | POA: Diagnosis not present

## 2023-03-20 DIAGNOSIS — I120 Hypertensive chronic kidney disease with stage 5 chronic kidney disease or end stage renal disease: Secondary | ICD-10-CM | POA: Diagnosis not present

## 2023-03-20 DIAGNOSIS — Z992 Dependence on renal dialysis: Secondary | ICD-10-CM | POA: Diagnosis not present

## 2023-03-21 DIAGNOSIS — E8770 Fluid overload, unspecified: Secondary | ICD-10-CM | POA: Diagnosis not present

## 2023-03-21 DIAGNOSIS — N186 End stage renal disease: Secondary | ICD-10-CM | POA: Diagnosis not present

## 2023-03-21 DIAGNOSIS — Z992 Dependence on renal dialysis: Secondary | ICD-10-CM | POA: Diagnosis not present

## 2023-03-22 ENCOUNTER — Ambulatory Visit: Payer: Medicaid Other | Admitting: Family

## 2023-03-22 DIAGNOSIS — Z992 Dependence on renal dialysis: Secondary | ICD-10-CM | POA: Diagnosis not present

## 2023-03-22 DIAGNOSIS — E8770 Fluid overload, unspecified: Secondary | ICD-10-CM | POA: Diagnosis not present

## 2023-03-22 DIAGNOSIS — N186 End stage renal disease: Secondary | ICD-10-CM | POA: Diagnosis not present

## 2023-03-25 DIAGNOSIS — E8770 Fluid overload, unspecified: Secondary | ICD-10-CM | POA: Diagnosis not present

## 2023-03-25 DIAGNOSIS — N186 End stage renal disease: Secondary | ICD-10-CM | POA: Diagnosis not present

## 2023-03-25 DIAGNOSIS — Z992 Dependence on renal dialysis: Secondary | ICD-10-CM | POA: Diagnosis not present

## 2023-03-26 DIAGNOSIS — E8770 Fluid overload, unspecified: Secondary | ICD-10-CM | POA: Diagnosis not present

## 2023-03-26 DIAGNOSIS — N186 End stage renal disease: Secondary | ICD-10-CM | POA: Diagnosis not present

## 2023-03-26 DIAGNOSIS — Z992 Dependence on renal dialysis: Secondary | ICD-10-CM | POA: Diagnosis not present

## 2023-03-27 ENCOUNTER — Ambulatory Visit: Payer: Self-pay

## 2023-03-27 NOTE — Patient Outreach (Signed)
  Care Coordination   Follow Up Visit Note   03/27/2023 Name: George Day MRN: [id number] DOB: [birth year]  George Day is a 47 y.o. year old male who sees George Macadamia, MD for primary care. I spoke with  George Day by phone today.  What matters to the patients health and wellness today?  Patient would like to learn more about Peritoneal dialysis. He is trying to get use to having a dialysis catheter.     Goals Addressed             This Visit's Progress    To schedule a follow up visit with kidney specialist       Care Coordination Interventions: Assessed the Patient understanding of chronic kidney disease stage V  Evaluation of current treatment plan related to chronic kidney disease self management and patient's adherence to plan as established by provider     Determined patient was admitted to [facility] for uremia, he was started on hemodialysis Discussed with patient he is receiving outpatient dialysis at the [facility] on [facility] on Monday, Tuesday, Thursday and Friday  Reviewed prescribed diet renal. Instructed patient to follow the renal diet and fluid restrictions provided by the kidney center dietician Assessed social determinant of health barriers   , patient is using [place] transportation for his dialysis treatments  Reviewed upcoming scheduled vascular procedure for graft placement Mailed printed educational materials related to Peritoneal dialysis, Hemodialysis, Kidney Transplant Last practice recorded BP readings:  BP Readings from Last 3 Encounters:  03/07/23 (!) 144/83  03/01/23 (!) 177/102  02/19/23 128/78   Most recent eGFR/CrCl:  Lab Results  Component Value Date   EGFR 14 (L) 03/01/2023    No components found for: "CRCL"    Interventions Today    Flowsheet Row Most Recent Value  Chronic Disease   Chronic disease during today's visit Chronic Kidney Disease/End Stage Renal Disease (ESRD)  General Interventions   General  Interventions Discussed/Reviewed General Interventions Discussed, General Interventions Reviewed, Doctor Visits  Doctor Visits Discussed/Reviewed Doctor Visits Discussed, Doctor Visits Reviewed, Specialist  Education Interventions   Education Provided Provided Education, Provided Printed Education  Provided Verbal Education On Nutrition, When to see the doctor  Nutrition Interventions   Nutrition Discussed/Reviewed Nutrition Discussed, Nutrition Reviewed, Fluid intake  [renal]          SDOH assessments and interventions completed:  No     Care Coordination Interventions:  Yes, provided   Follow up plan: Follow up call scheduled for 04/24/23 @09 :30 AM    Encounter Outcome:  Pt. Visit Completed

## 2023-03-27 NOTE — Patient Instructions (Signed)
Visit Information  Thank you for taking time to visit with me today. Please don't hesitate to contact me if I can be of assistance to you.   Following are the goals we discussed today:   Goals Addressed             This Visit's Progress    To schedule a follow up visit with kidney specialist       Care Coordination Interventions: Assessed the Patient understanding of chronic kidney disease stage V  Evaluation of current treatment plan related to chronic kidney disease self management and patient's adherence to plan as established by provider     Determined patient was admitted to [facility] for uremia, he was started on hemodialysis Discussed with patient he is receiving outpatient dialysis at the [facility] on [facility] on Monday, Tuesday, Thursday and Friday  Reviewed prescribed diet renal. Instructed patient to follow the renal diet and fluid restrictions provided by the kidney center dietician Assessed social determinant of health barriers   , patient is using [place] transportation for his dialysis treatments  Reviewed upcoming scheduled vascular procedure for graft placement Mailed printed educational materials related to Peritoneal dialysis, Hemodialysis, Kidney Transplant Last practice recorded BP readings:  BP Readings from Last 3 Encounters:  03/07/23 (!) 144/83  03/01/23 (!) 177/102  02/19/23 128/78   Most recent eGFR/CrCl:  Lab Results  Component Value Date   EGFR 14 (L) 03/01/2023    No components found for: "CRCL"        Our next appointment is by telephone on 04/24/23 at 09:30 AM  Please call the care guide team at [phone] if you need to cancel or reschedule your appointment.   If you are experiencing a Mental Health or Behavioral Health Crisis or need someone to talk to, please call [phone]-TALK (toll free, 24 hour hotline)  Patient verbalizes understanding of instructions and care plan provided today and agrees to view in MyChart. Active  MyChart status and patient understanding of how to access instructions and care plan via MyChart confirmed with patient.     [name], RN, BSN, CCM Care Management Coordinator [facility] Care Management  Direct Phone: [phone]

## 2023-03-28 DIAGNOSIS — Z992 Dependence on renal dialysis: Secondary | ICD-10-CM | POA: Diagnosis not present

## 2023-03-28 DIAGNOSIS — E8770 Fluid overload, unspecified: Secondary | ICD-10-CM | POA: Diagnosis not present

## 2023-03-28 DIAGNOSIS — N186 End stage renal disease: Secondary | ICD-10-CM | POA: Diagnosis not present

## 2023-03-29 DIAGNOSIS — N186 End stage renal disease: Secondary | ICD-10-CM | POA: Diagnosis not present

## 2023-03-29 DIAGNOSIS — E8770 Fluid overload, unspecified: Secondary | ICD-10-CM | POA: Diagnosis not present

## 2023-03-29 DIAGNOSIS — Z992 Dependence on renal dialysis: Secondary | ICD-10-CM | POA: Diagnosis not present

## 2023-04-01 DIAGNOSIS — T82898A Other specified complication of vascular prosthetic devices, implants and grafts, initial encounter: Secondary | ICD-10-CM | POA: Diagnosis not present

## 2023-04-01 DIAGNOSIS — E8770 Fluid overload, unspecified: Secondary | ICD-10-CM | POA: Diagnosis not present

## 2023-04-01 DIAGNOSIS — Z992 Dependence on renal dialysis: Secondary | ICD-10-CM | POA: Diagnosis not present

## 2023-04-01 DIAGNOSIS — N186 End stage renal disease: Secondary | ICD-10-CM | POA: Diagnosis not present

## 2023-04-02 DIAGNOSIS — Z992 Dependence on renal dialysis: Secondary | ICD-10-CM | POA: Diagnosis not present

## 2023-04-02 DIAGNOSIS — E8770 Fluid overload, unspecified: Secondary | ICD-10-CM | POA: Diagnosis not present

## 2023-04-02 DIAGNOSIS — N186 End stage renal disease: Secondary | ICD-10-CM | POA: Diagnosis not present

## 2023-04-04 ENCOUNTER — Other Ambulatory Visit: Payer: Self-pay

## 2023-04-04 DIAGNOSIS — N186 End stage renal disease: Secondary | ICD-10-CM | POA: Diagnosis not present

## 2023-04-04 DIAGNOSIS — Z992 Dependence on renal dialysis: Secondary | ICD-10-CM | POA: Diagnosis not present

## 2023-04-04 DIAGNOSIS — E8770 Fluid overload, unspecified: Secondary | ICD-10-CM | POA: Diagnosis not present

## 2023-04-04 MED ORDER — OLMESARTAN MEDOXOMIL 20 MG PO TABS
20.0000 mg | ORAL_TABLET | Freq: Every day | ORAL | 11 refills | Status: AC
  Filled 2023-04-04: qty 30, 30d supply, fill #0

## 2023-04-05 ENCOUNTER — Other Ambulatory Visit: Payer: Self-pay

## 2023-04-05 DIAGNOSIS — E8770 Fluid overload, unspecified: Secondary | ICD-10-CM | POA: Diagnosis not present

## 2023-04-05 DIAGNOSIS — N186 End stage renal disease: Secondary | ICD-10-CM | POA: Diagnosis not present

## 2023-04-05 DIAGNOSIS — Z992 Dependence on renal dialysis: Secondary | ICD-10-CM | POA: Diagnosis not present

## 2023-04-08 ENCOUNTER — Other Ambulatory Visit: Payer: Self-pay

## 2023-04-08 DIAGNOSIS — D509 Iron deficiency anemia, unspecified: Secondary | ICD-10-CM | POA: Diagnosis not present

## 2023-04-08 DIAGNOSIS — N2581 Secondary hyperparathyroidism of renal origin: Secondary | ICD-10-CM | POA: Diagnosis not present

## 2023-04-08 DIAGNOSIS — E1122 Type 2 diabetes mellitus with diabetic chronic kidney disease: Secondary | ICD-10-CM | POA: Diagnosis not present

## 2023-04-08 DIAGNOSIS — D631 Anemia in chronic kidney disease: Secondary | ICD-10-CM | POA: Diagnosis not present

## 2023-04-08 DIAGNOSIS — E43 Unspecified severe protein-calorie malnutrition: Secondary | ICD-10-CM | POA: Diagnosis not present

## 2023-04-08 DIAGNOSIS — Z992 Dependence on renal dialysis: Secondary | ICD-10-CM | POA: Diagnosis not present

## 2023-04-08 DIAGNOSIS — N186 End stage renal disease: Secondary | ICD-10-CM | POA: Diagnosis not present

## 2023-04-08 MED ORDER — SEVELAMER CARBONATE 800 MG PO TABS
800.0000 mg | ORAL_TABLET | Freq: Three times a day (TID) | ORAL | 3 refills | Status: AC
  Filled 2023-04-08: qty 90, 30d supply, fill #0
  Filled 2023-05-08: qty 90, 30d supply, fill #1

## 2023-04-08 NOTE — Progress Notes (Unsigned)
[facility] SPECIAL[facility] OF [place]  ASSESSMENT / PLAN: George Day is a 47 y.o. right handed male in need of permanent dialysis access.  Hidradenitis of left axilla has resolved.  Plan to proceed with left arteriovenous graft.  Will evaluate left axilla immediately prior to proceeding with surgery.  CHIEF COMPLAINT: End stage renal disease  HISTORY OF PRESENT ILLNESS: George Day is a 47 y.o. male with end-stage renal disease dialyzing through a right IJ tunneled dialysis catheter.  I had planned to create a left arm arteriovenous graft for him several weeks ago, but had to abort because of a hidradenitis infection in his left axilla.  I prescribed him doxycycline, and he sought significant improvement in his left axilla.  He has been dealing with hidradenitis for his whole life, but did not know this was a diagnosis.  He just figured this was something that happened to him.  I counseled him about hidradenitis and the risk of graft infection with active infection.    Past Medical History:  Diagnosis Date   (HFpEF) heart failure with preserved ejection fraction (HCC)    Anemia 1998   Anginal pain (HCC)    Anxiety    Asthma    CKD (chronic kidney disease) stage 5, GFR less than 15 ml/min (HCC)    Coronary artery disease    Diabetes mellitus without complication (HCC) 1995   Hypertension 2019   MI (myocardial infarction) (HCC) 2010   Multiple sclerosis (HCC)    Sleep apnea    Stroke (HCC) 2010    Past Surgical History:  Procedure Laterality Date   BIOPSY  08/18/2022   Procedure: BIOPSY;  Surgeon: Imogene Burn, MD;  Location: [facility] ENDOSCOPY;  Service: Gastroenterology;;   CARDIAC CATHETERIZATION  2010   COLONOSCOPY WITH PROPOFOL N/A 08/18/2022   Procedure: COLONOSCOPY WITH PROPOFOL;  Surgeon: Imogene Burn, MD;  Location: [facility] ENDOSCOPY;  Service: Gastroenterology;  Laterality: N/A;   ESOPHAGOGASTRODUODENOSCOPY (EGD) WITH PROPOFOL N/A 08/18/2022   Procedure:  ESOPHAGOGASTRODUODENOSCOPY (EGD) WITH PROPOFOL;  Surgeon: Imogene Burn, MD;  Location: [facility] ENDOSCOPY;  Service: Gastroenterology;  Laterality: N/A;   IR FLUORO GUIDE CV LINE RIGHT  03/05/2023   IR US GUIDE VASC ACCESS RIGHT  03/05/2023   POLYPECTOMY  08/18/2022   Procedure: POLYPECTOMY;  Surgeon: Imogene Burn, MD;  Location: [facility] ENDOSCOPY;  Service: Gastroenterology;;    Family History  Problem Relation Age of Onset   Diabetes Mother    Hyperlipidemia Mother    Kidney disease Mother    Drug abuse Father    Diabetes Sister    Hyperlipidemia Sister    Hearing loss Sister    Diabetes Brother    Hyperlipidemia Brother     Social History   Socioeconomic History   Marital status: Married    Spouse name: Not on file   Number of children: 4   Years of education: Not on file   Highest education level: 10th grade  Occupational History   Occupation: Hydrologist  Tobacco Use   Smoking status: Former    Current packs/day: 0.00    Average packs/day: 0.5 packs/day for 31.0 years (15.5 ttl pk-yrs)    Types: Cigars, E-cigarettes, Cigarettes    Start date: 09/1990    Quit date: 09/2021    Years since quitting: 1.5   Smokeless tobacco: Never  Vaping Use   Vaping status: Never Used  Substance and Sexual Activity   Alcohol use: Not Currently    Comment: [employer]  Drug use: Yes    Types: Marijuana    Comment: 2 x week   Sexual activity: Yes    Birth control/protection: None  Other Topics Concern   Not on file  Social History Narrative   Not on file   Social Determinants of Health   Financial Resource Strain: Medium Risk (09/20/2022)   Overall Financial Resource Strain (CARDIA)    Difficulty of Paying Living Expenses: Somewhat hard  Food Insecurity: No Food Insecurity (03/11/2023)   Hunger Vital Sign    Worried About Running Out of Food in the Last Year: Never true    Ran Out of Food in the Last Year: Never true  Transportation Needs: No Transportation Needs  (03/03/2023)   PRAPARE - Administrator, Civil Service (Medical): No    Lack of Transportation (Non-Medical): No  Physical Activity: Not on file  Stress: Not on file  Social Connections: Not on file  Intimate Partner Violence: Not At Risk (03/03/2023)   Humiliation, Afraid, Rape, and Kick questionnaire    Fear of Current or Ex-Partner: No    Emotionally Abused: No    Physically Abused: No    Sexually Abused: No    Allergies  Allergen Reactions   Honey Bee Venom [Bee Venom] Anaphylaxis   Penicillins Anaphylaxis    ALL   Tomato Swelling    Current Outpatient Medications  Medication Sig Dispense Refill   amLODipine (NORVASC) 10 MG tablet Take 1 tablet (10 mg total) by mouth daily. 90 tablet 1   aspirin EC 81 MG tablet Take 1 tablet (81 mg total) by mouth daily. 90 tablet 1   atorvastatin (LIPITOR) 40 MG tablet Take 1 tablet (40 mg total) by mouth daily. 90 tablet 1   carvedilol (COREG) 25 MG tablet Take 2 tablets (50 mg total) by mouth 2 (two) times daily. 360 tablet 1   doxycycline (VIBRA-TABS) 100 MG tablet Take 1 tablet (100 mg total) by mouth 2 (two) times daily. 28 tablet 0   famotidine (PEPCID) 20 MG tablet Take 1 tablet (20 mg total) by mouth daily. 90 tablet 1   Fluticasone-Umeclidin-Vilant (TRELEGY ELLIPTA) 100-62.5-25 MCG/ACT AEPB Inhale 1 puff into the lungs daily. 60 each 5   nitroGLYCERIN (NITROSTAT) 0.6 MG SL tablet Place 1 tablet (0.6 mg total) under the tongue every 5 (five) minutes as needed for chest pain. 100 tablet 5   olmesartan (BENICAR) 20 MG tablet Take 1 tablet (20 mg total) by mouth at bedtime for high blood pressure. 30 tablet 11   potassium chloride SA (KLOR-CON M) 20 MEQ tablet Take 1 tablet (20 mEq total) by mouth daily. 60 tablet 2   sevelamer carbonate (RENVELA) 800 MG tablet Take 1 tablet (800 mg total) by mouth 3 (three) times daily with meals. 270 tablet 3   torsemide (DEMADEX) 20 MG tablet Take 2 tablets (40 mg total) by mouth daily. 60  tablet 0   No current facility-administered medications for this visit.    PHYSICAL EXAM Vitals:   04/09/23 0905  BP: (!) 152/83  Pulse: 83  Resp: 20  Temp: 98.1 F (36.7 C)  SpO2: 97%  Weight: 200 lb (90.7 kg)  Height: 5\' 9"  (1.753 m)    No distress Left axillary infection resolved Scarring in both axillae consistent with hidradenitis Palpable radial pulses  PERTINENT LABORATORY AND RADIOLOGIC DATA  Most recent CBC    Latest Ref Rng & Units 03/06/2023    3:11 AM 03/05/2023    1:04 AM 03/04/2023  1:35 AM  CBC  WBC 4.0 - 10.5 K/uL 4.8  5.2  6.0   Hemoglobin 13.0 - 17.0 g/dL 9.1  9.0  8.8   Hematocrit 39.0 - 52.0 % 29.0  27.5  27.8   Platelets 150 - 400 K/uL 200  200  225      Most recent CMP    Latest Ref Rng & Units 03/07/2023   12:44 AM 03/06/2023    3:11 AM 03/05/2023    1:04 AM  CMP  Glucose 70 - 99 mg/dL 409  89  811   BUN 6 - 20 mg/dL 31  43  54   Creatinine 0.61 - 1.24 mg/dL 9.14  7.82  9.56   Sodium 135 - 145 mmol/L 131  133  134   Potassium 3.5 - 5.1 mmol/L 3.1  3.4  3.4   Chloride 98 - 111 mmol/L 97  101  105   CO2 22 - 32 mmol/L 25  23  22    Calcium 8.9 - 10.3 mg/dL 8.3  8.5  8.3     Renal function CrCl cannot be calculated (Patient's most recent lab result is older than the maximum 21 days allowed.).  HbA1c POC (<> result, manual entry) (%)  Date Value  01/06/2020 5.6   Hgb A1c MFr Bld (% of total Hgb)  Date Value  11/22/2022 6.0 (H)    LDL Cholesterol (Calc)  Date Value Ref Range Status  11/22/2022 74 mg/dL (calc) Final    Comment:    Reference range: <100 . Desirable range <100 mg/dL for primary prevention;   <70 mg/dL for patients with CHD or diabetic patients  with > or = 2 CHD risk factors. Marland Kitchen LDL-C is now calculated using the Martin-Hopkins  calculation, which is a validated novel method providing  better accuracy than the Friedewald equation in the  estimation of LDL-C.  Horald Pollen et al. Lenox Ahr. 2130;865(78): 2061-2068   (http://education.[employer].com/faq/FAQ164)     Rande Brunt. Lenell Antu, MD FACS [facility] Phone Number: [phone] 04/09/2023 11:06 AM   Total time spent on preparing this encounter including chart review, data review, collecting history, examining the patient, coordinating care for this established patient, 20 minutes.  Portions of this report may have been transcribed using voice recognition software.  Every effort has been made to ensure accuracy; however, inadvertent computerized transcription errors may still be present.

## 2023-04-08 NOTE — H&P (View-Only) (Signed)
VASCULAR AND VEIN SPECIALISTS OF [place]  ASSESSMENT / PLAN: George Day is a 47 y.o. right handed male in need of permanent dialysis access.  Hidradenitis of left axilla has resolved.  Plan to proceed with left arteriovenous graft.  Will evaluate left axilla immediately prior to proceeding with surgery.  CHIEF COMPLAINT: End stage renal disease  HISTORY OF PRESENT ILLNESS: George Day is a 47 y.o. male with end-stage renal disease dialyzing through a right IJ tunneled dialysis catheter.  I had planned to create a left arm arteriovenous graft for him several weeks ago, but had to abort because of a hidradenitis infection in his left axilla.  I prescribed him doxycycline, and he sought significant improvement in his left axilla.  He has been dealing with hidradenitis for his whole life, but did not know this was a diagnosis.  He just figured this was something that happened to him.  I counseled him about hidradenitis and the risk of graft infection with active infection.    Past Medical History:  Diagnosis Date   (HFpEF) heart failure with preserved ejection fraction (HCC)    Anemia 1998   Anginal pain (HCC)    Anxiety    Asthma    CKD (chronic kidney disease) stage 5, GFR less than 15 ml/min (HCC)    Coronary artery disease    Diabetes mellitus without complication (HCC) 1995   Hypertension 2019   MI (myocardial infarction) (HCC) 2010   Multiple sclerosis (HCC)    Sleep apnea    Stroke (HCC) 2010    Past Surgical History:  Procedure Laterality Date   BIOPSY  08/18/2022   Procedure: BIOPSY;  Surgeon: Imogene Burn, MD;  Location: [facility] ENDOSCOPY;  Service: Gastroenterology;;   CARDIAC CATHETERIZATION  2010   COLONOSCOPY WITH PROPOFOL N/A 08/18/2022   Procedure: COLONOSCOPY WITH PROPOFOL;  Surgeon: Imogene Burn, MD;  Location: [facility] ENDOSCOPY;  Service: Gastroenterology;  Laterality: N/A;   ESOPHAGOGASTRODUODENOSCOPY (EGD) WITH PROPOFOL N/A 08/18/2022   Procedure:  ESOPHAGOGASTRODUODENOSCOPY (EGD) WITH PROPOFOL;  Surgeon: Imogene Burn, MD;  Location: [facility] ENDOSCOPY;  Service: Gastroenterology;  Laterality: N/A;   IR FLUORO GUIDE CV LINE RIGHT  03/05/2023   IR US GUIDE VASC ACCESS RIGHT  03/05/2023   POLYPECTOMY  08/18/2022   Procedure: POLYPECTOMY;  Surgeon: Imogene Burn, MD;  Location: [facility] ENDOSCOPY;  Service: Gastroenterology;;    Family History  Problem Relation Age of Onset   Diabetes Mother    Hyperlipidemia Mother    Kidney disease Mother    Drug abuse Father    Diabetes Sister    Hyperlipidemia Sister    Hearing loss Sister    Diabetes Brother    Hyperlipidemia Brother     Social History   Socioeconomic History   Marital status: Married    Spouse name: Not on file   Number of children: 4   Years of education: Not on file   Highest education level: 10th grade  Occupational History   Occupation: Hydrologist  Tobacco Use   Smoking status: Former    Current packs/day: 0.00    Average packs/day: 0.5 packs/day for 31.0 years (15.5 ttl pk-yrs)    Types: Cigars, E-cigarettes, Cigarettes    Start date: 09/1990    Quit date: 09/2021    Years since quitting: 1.5   Smokeless tobacco: Never  Vaping Use   Vaping status: Never Used  Substance and Sexual Activity   Alcohol use: Not Currently    Comment: [employer]  Drug use: Yes    Types: Marijuana    Comment: 2 x week   Sexual activity: Yes    Birth control/protection: None  Other Topics Concern   Not on file  Social History Narrative   Not on file   Social Determinants of Health   Financial Resource Strain: Medium Risk (09/20/2022)   Overall Financial Resource Strain (CARDIA)    Difficulty of Paying Living Expenses: Somewhat hard  Food Insecurity: No Food Insecurity (03/11/2023)   Hunger Vital Sign    Worried About Running Out of Food in the Last Year: Never true    Ran Out of Food in the Last Year: Never true  Transportation Needs: No Transportation Needs  (03/03/2023)   PRAPARE - Administrator, Civil Service (Medical): No    Lack of Transportation (Non-Medical): No  Physical Activity: Not on file  Stress: Not on file  Social Connections: Not on file  Intimate Partner Violence: Not At Risk (03/03/2023)   Humiliation, Afraid, Rape, and Kick questionnaire    Fear of Current or Ex-Partner: No    Emotionally Abused: No    Physically Abused: No    Sexually Abused: No    Allergies  Allergen Reactions   Honey Bee Venom [Bee Venom] Anaphylaxis   Penicillins Anaphylaxis    ALL   Tomato Swelling    Current Outpatient Medications  Medication Sig Dispense Refill   amLODipine (NORVASC) 10 MG tablet Take 1 tablet (10 mg total) by mouth daily. 90 tablet 1   aspirin EC 81 MG tablet Take 1 tablet (81 mg total) by mouth daily. 90 tablet 1   atorvastatin (LIPITOR) 40 MG tablet Take 1 tablet (40 mg total) by mouth daily. 90 tablet 1   carvedilol (COREG) 25 MG tablet Take 2 tablets (50 mg total) by mouth 2 (two) times daily. 360 tablet 1   doxycycline (VIBRA-TABS) 100 MG tablet Take 1 tablet (100 mg total) by mouth 2 (two) times daily. 28 tablet 0   famotidine (PEPCID) 20 MG tablet Take 1 tablet (20 mg total) by mouth daily. 90 tablet 1   Fluticasone-Umeclidin-Vilant (TRELEGY ELLIPTA) 100-62.5-25 MCG/ACT AEPB Inhale 1 puff into the lungs daily. 60 each 5   nitroGLYCERIN (NITROSTAT) 0.6 MG SL tablet Place 1 tablet (0.6 mg total) under the tongue every 5 (five) minutes as needed for chest pain. 100 tablet 5   olmesartan (BENICAR) 20 MG tablet Take 1 tablet (20 mg total) by mouth at bedtime for high blood pressure. 30 tablet 11   potassium chloride SA (KLOR-CON M) 20 MEQ tablet Take 1 tablet (20 mEq total) by mouth daily. 60 tablet 2   sevelamer carbonate (RENVELA) 800 MG tablet Take 1 tablet (800 mg total) by mouth 3 (three) times daily with meals. 270 tablet 3   torsemide (DEMADEX) 20 MG tablet Take 2 tablets (40 mg total) by mouth daily. 60  tablet 0   No current facility-administered medications for this visit.    PHYSICAL EXAM Vitals:   04/09/23 0905  BP: (!) 152/83  Pulse: 83  Resp: 20  Temp: 98.1 F (36.7 C)  SpO2: 97%  Weight: 200 lb (90.7 kg)  Height: 5\' 9"  (1.753 m)    No distress Left axillary infection resolved Scarring in both axillae consistent with hidradenitis Palpable radial pulses  PERTINENT LABORATORY AND RADIOLOGIC DATA  Most recent CBC    Latest Ref Rng & Units 03/06/2023    3:11 AM 03/05/2023    1:04 AM 03/04/2023  1:35 AM  CBC  WBC 4.0 - 10.5 K/uL 4.8  5.2  6.0   Hemoglobin 13.0 - 17.0 g/dL 9.1  9.0  8.8   Hematocrit 39.0 - 52.0 % 29.0  27.5  27.8   Platelets 150 - 400 K/uL 200  200  225      Most recent CMP    Latest Ref Rng & Units 03/07/2023   12:44 AM 03/06/2023    3:11 AM 03/05/2023    1:04 AM  CMP  Glucose 70 - 99 mg/dL 604  89  540   BUN 6 - 20 mg/dL 31  43  54   Creatinine 0.61 - 1.24 mg/dL 9.81  1.91  4.78   Sodium 135 - 145 mmol/L 131  133  134   Potassium 3.5 - 5.1 mmol/L 3.1  3.4  3.4   Chloride 98 - 111 mmol/L 97  101  105   CO2 22 - 32 mmol/L 25  23  22    Calcium 8.9 - 10.3 mg/dL 8.3  8.5  8.3     Renal function CrCl cannot be calculated (Patient's most recent lab result is older than the maximum 21 days allowed.).  HbA1c POC (<> result, manual entry) (%)  Date Value  01/06/2020 5.6   Hgb A1c MFr Bld (% of total Hgb)  Date Value  11/22/2022 6.0 (H)    LDL Cholesterol (Calc)  Date Value Ref Range Status  11/22/2022 74 mg/dL (calc) Final    Comment:    Reference range: <100 . Desirable range <100 mg/dL for primary prevention;   <70 mg/dL for patients with CHD or diabetic patients  with > or = 2 CHD risk factors. Marland Kitchen LDL-C is now calculated using the Martin-Hopkins  calculation, which is a validated novel method providing  better accuracy than the Friedewald equation in the  estimation of LDL-C.  Horald Pollen et al. Lenox Ahr. 2956;213(08): 2061-2068   (http://education.[facility].com/faq/FAQ164)     Rande Brunt. Lenell Antu, MD FACS [facility] Phone Number: [phone] 04/09/2023 11:06 AM   Total time spent on preparing this encounter including chart review, data review, collecting history, examining the patient, coordinating care for this established patient, 20 minutes.  Portions of this report may have been transcribed using voice recognition software.  Every effort has been made to ensure accuracy; however, inadvertent computerized transcription errors may still be present.

## 2023-04-09 ENCOUNTER — Encounter: Payer: Self-pay | Admitting: Vascular Surgery

## 2023-04-09 ENCOUNTER — Other Ambulatory Visit: Payer: Self-pay

## 2023-04-09 ENCOUNTER — Ambulatory Visit (INDEPENDENT_AMBULATORY_CARE_PROVIDER_SITE_OTHER): Payer: 59 | Admitting: Vascular Surgery

## 2023-04-09 VITALS — BP 152/83 | HR 83 | Temp 98.1°F | Resp 20 | Ht 69.0 in | Wt 200.0 lb

## 2023-04-09 DIAGNOSIS — E1122 Type 2 diabetes mellitus with diabetic chronic kidney disease: Secondary | ICD-10-CM | POA: Diagnosis not present

## 2023-04-09 DIAGNOSIS — D631 Anemia in chronic kidney disease: Secondary | ICD-10-CM | POA: Diagnosis not present

## 2023-04-09 DIAGNOSIS — Z992 Dependence on renal dialysis: Secondary | ICD-10-CM | POA: Diagnosis not present

## 2023-04-09 DIAGNOSIS — E43 Unspecified severe protein-calorie malnutrition: Secondary | ICD-10-CM | POA: Diagnosis not present

## 2023-04-09 DIAGNOSIS — N186 End stage renal disease: Secondary | ICD-10-CM

## 2023-04-09 DIAGNOSIS — L732 Hidradenitis suppurativa: Secondary | ICD-10-CM

## 2023-04-09 DIAGNOSIS — D509 Iron deficiency anemia, unspecified: Secondary | ICD-10-CM | POA: Diagnosis not present

## 2023-04-09 DIAGNOSIS — N2581 Secondary hyperparathyroidism of renal origin: Secondary | ICD-10-CM | POA: Diagnosis not present

## 2023-04-10 ENCOUNTER — Other Ambulatory Visit: Payer: Self-pay

## 2023-04-10 DIAGNOSIS — K219 Gastro-esophageal reflux disease without esophagitis: Secondary | ICD-10-CM

## 2023-04-10 DIAGNOSIS — I5032 Chronic diastolic (congestive) heart failure: Secondary | ICD-10-CM

## 2023-04-10 MED ORDER — FAMOTIDINE 20 MG PO TABS
20.0000 mg | ORAL_TABLET | Freq: Every day | ORAL | 1 refills | Status: DC
Start: 2023-04-10 — End: 2023-10-02

## 2023-04-10 MED ORDER — TRELEGY ELLIPTA 100-62.5-25 MCG/ACT IN AEPB: 1.0000 | INHALATION_SPRAY | Freq: Every day | RESPIRATORY_TRACT | 5 refills | Status: DC

## 2023-04-10 MED ORDER — POTASSIUM CHLORIDE CRYS ER 20 MEQ PO TBCR
20.0000 meq | EXTENDED_RELEASE_TABLET | Freq: Every day | ORAL | Status: DC
Start: 2023-04-10 — End: 2023-10-02

## 2023-04-11 ENCOUNTER — Other Ambulatory Visit: Payer: Self-pay

## 2023-04-11 DIAGNOSIS — N186 End stage renal disease: Secondary | ICD-10-CM | POA: Diagnosis not present

## 2023-04-11 DIAGNOSIS — N2581 Secondary hyperparathyroidism of renal origin: Secondary | ICD-10-CM | POA: Diagnosis not present

## 2023-04-11 DIAGNOSIS — Z992 Dependence on renal dialysis: Secondary | ICD-10-CM | POA: Diagnosis not present

## 2023-04-12 ENCOUNTER — Other Ambulatory Visit: Payer: Self-pay

## 2023-04-12 ENCOUNTER — Encounter (HOSPITAL_COMMUNITY): Payer: Self-pay | Admitting: Emergency Medicine

## 2023-04-12 ENCOUNTER — Emergency Department (HOSPITAL_COMMUNITY)
Admission: EM | Admit: 2023-04-12 | Discharge: 2023-04-13 | Disposition: A | Discharge status: Home / Self Care | Payer: 59 | Attending: Emergency Medicine | Admitting: Emergency Medicine

## 2023-04-12 DIAGNOSIS — J449 Chronic obstructive pulmonary disease, unspecified: Secondary | ICD-10-CM | POA: Insufficient documentation

## 2023-04-12 DIAGNOSIS — J45909 Unspecified asthma, uncomplicated: Secondary | ICD-10-CM | POA: Insufficient documentation

## 2023-04-12 DIAGNOSIS — R519 Headache, unspecified: Secondary | ICD-10-CM

## 2023-04-12 DIAGNOSIS — N186 End stage renal disease: Secondary | ICD-10-CM | POA: Insufficient documentation

## 2023-04-12 DIAGNOSIS — E1122 Type 2 diabetes mellitus with diabetic chronic kidney disease: Secondary | ICD-10-CM | POA: Insufficient documentation

## 2023-04-12 DIAGNOSIS — M791 Myalgia, unspecified site: Secondary | ICD-10-CM | POA: Insufficient documentation

## 2023-04-12 DIAGNOSIS — Z79899 Other long term (current) drug therapy: Secondary | ICD-10-CM | POA: Diagnosis not present

## 2023-04-12 DIAGNOSIS — I132 Hypertensive heart and chronic kidney disease with heart failure and with stage 5 chronic kidney disease, or end stage renal disease: Secondary | ICD-10-CM | POA: Insufficient documentation

## 2023-04-12 DIAGNOSIS — I509 Heart failure, unspecified: Secondary | ICD-10-CM | POA: Diagnosis not present

## 2023-04-12 DIAGNOSIS — R0789 Other chest pain: Secondary | ICD-10-CM | POA: Diagnosis not present

## 2023-04-12 DIAGNOSIS — R52 Pain, unspecified: Secondary | ICD-10-CM

## 2023-04-12 DIAGNOSIS — Z20822 Contact with and (suspected) exposure to covid-19: Secondary | ICD-10-CM | POA: Insufficient documentation

## 2023-04-12 DIAGNOSIS — I2510 Atherosclerotic heart disease of native coronary artery without angina pectoris: Secondary | ICD-10-CM | POA: Insufficient documentation

## 2023-04-12 DIAGNOSIS — Z7982 Long term (current) use of aspirin: Secondary | ICD-10-CM | POA: Diagnosis not present

## 2023-04-12 DIAGNOSIS — N2581 Secondary hyperparathyroidism of renal origin: Secondary | ICD-10-CM | POA: Diagnosis not present

## 2023-04-12 DIAGNOSIS — Z992 Dependence on renal dialysis: Secondary | ICD-10-CM | POA: Insufficient documentation

## 2023-04-12 DIAGNOSIS — Z743 Need for continuous supervision: Secondary | ICD-10-CM | POA: Diagnosis not present

## 2023-04-12 LAB — CBC WITH DIFFERENTIAL/PLATELET
Abs Immature Granulocytes: 0.01 10*3/uL (ref 0.00–0.07)
Basophils Absolute: 0 10*3/uL (ref 0.0–0.1)
Basophils Relative: 1 %
Eosinophils Absolute: 0.3 10*3/uL (ref 0.0–0.5)
Eosinophils Relative: 5 %
HCT: 37 % — ABNORMAL LOW (ref 39.0–52.0)
Hemoglobin: 11.9 g/dL — ABNORMAL LOW (ref 13.0–17.0)
Immature Granulocytes: 0 %
Lymphocytes Relative: 33 %
Lymphs Abs: 1.7 10*3/uL (ref 0.7–4.0)
MCH: 23 pg — ABNORMAL LOW (ref 26.0–34.0)
MCHC: 32.2 g/dL (ref 30.0–36.0)
MCV: 71.6 fL — ABNORMAL LOW (ref 80.0–100.0)
Monocytes Absolute: 0.7 10*3/uL (ref 0.1–1.0)
Monocytes Relative: 14 %
Neutro Abs: 2.6 10*3/uL (ref 1.7–7.7)
Neutrophils Relative %: 47 %
Platelets: 171 10*3/uL (ref 150–400)
RBC: 5.17 MIL/uL (ref 4.22–5.81)
RDW: 17.4 % — ABNORMAL HIGH (ref 11.5–15.5)
WBC: 5.3 10*3/uL (ref 4.0–10.5)
nRBC: 0 % (ref 0.0–0.2)

## 2023-04-12 MED ORDER — PROCHLORPERAZINE MALEATE 5 MG PO TABS
10.0000 mg | ORAL_TABLET | Freq: Once | ORAL | Status: AC
  Administered 2023-04-12: 10 mg via ORAL
  Filled 2023-04-12: qty 2

## 2023-04-12 MED ORDER — DEXAMETHASONE SODIUM PHOSPHATE 10 MG/ML IJ SOLN
10.0000 mg | Freq: Once | INTRAMUSCULAR | Status: AC
  Administered 2023-04-12: 10 mg via INTRAMUSCULAR
  Filled 2023-04-12: qty 1

## 2023-04-12 NOTE — ED Triage Notes (Signed)
BIB EMS from home.  Feeling weak and aches since this morning.  Pt is a dialysis patient and has not missed any treatments.  Feels generally unwell.

## 2023-04-13 LAB — BASIC METABOLIC PANEL
Anion gap: 18 — ABNORMAL HIGH (ref 5–15)
BUN: 28 mg/dL — ABNORMAL HIGH (ref 6–20)
CO2: 24 mmol/L (ref 22–32)
Calcium: 8.3 mg/dL — ABNORMAL LOW (ref 8.9–10.3)
Chloride: 94 mmol/L — ABNORMAL LOW (ref 98–111)
Creatinine, Ser: 7.05 mg/dL — ABNORMAL HIGH (ref 0.61–1.24)
GFR, Estimated: 9 mL/min — ABNORMAL LOW (ref >60)
Glucose, Bld: 105 mg/dL — ABNORMAL HIGH (ref 70–99)
Potassium: 3.4 mmol/L — ABNORMAL LOW (ref 3.5–5.1)
Sodium: 136 mmol/L (ref 135–145)

## 2023-04-13 LAB — SARS CORONAVIRUS 2 BY RT PCR: SARS Coronavirus 2 by RT PCR: NEGATIVE

## 2023-04-13 NOTE — Discharge Instructions (Signed)
You were evaluated today for body aches and a headache.  Your headache improved with medicine.  Please take Tylenol at home for continued headache management.  You may be developing a viral infection.  Please be sure to get plenty of rest and fluids as you are able.  If you develop any life-threatening symptoms please return to the emergency department.

## 2023-04-13 NOTE — ED Notes (Signed)
Patient became irate and cursing at staff, stating " as soon as I asked for food, you guys discharge me". I advised there was no correlation between the two, and that he was ready to go.

## 2023-04-13 NOTE — ED Provider Notes (Signed)
EMERGENCY DEPARTMENT AT [facility] Provider Note   CSN: [id number] Arrival date & time: 04/12/23  2247     History  Chief Complaint  Patient presents with   Illness    George Day is a 47 y.o. male.  Patient presents to the emergency department via EMS complaining of bodyaches which began at approximately noon today.  He is a Monday Wednesday Friday dialysis patient had his normal dialysis session this morning without difficulty.  He denies fever, vomiting, abdominal pain, chest pain, shortness of breath.  He does endorse a mild headache with mild nausea.  Past medical history is significant for hypertension, heart failure preserved ejection fraction, history of CVA, coronary artery disease, asthma, anxiety, renal disease on dialysis, type II DM, COPD, MS  HPI     Home Medications Prior to Admission medications   Medication Sig Start Date End Date Taking? Authorizing Provider  amLODipine (NORVASC) 10 MG tablet Take 1 tablet (10 mg total) by mouth daily. 11/21/22   Ngetich, Dinah C, NP  aspirin EC 81 MG tablet Take 1 tablet (81 mg total) by mouth daily. 11/21/22   Ngetich, Dinah C, NP  atorvastatin (LIPITOR) 40 MG tablet Take 1 tablet (40 mg total) by mouth daily. 11/21/22   Ngetich, Dinah C, NP  carvedilol (COREG) 25 MG tablet Take 2 tablets (50 mg total) by mouth 2 (two) times daily. 11/21/22   Ngetich, Dinah C, NP  doxycycline (VIBRA-TABS) 100 MG tablet Take 1 tablet (100 mg total) by mouth 2 (two) times daily. 03/07/23   Gwenevere Abbot, MD  famotidine (PEPCID) 20 MG tablet Take 1 tablet (20 mg total) by mouth daily. 04/10/23   [name], MD  Fluticasone-Umeclidin-Vilant (TRELEGY ELLIPTA) 100-62.5-25 MCG/ACT AEPB Inhale 1 puff into the lungs daily. 04/10/23   [name], MD  nitroGLYCERIN (NITROSTAT) 0.6 MG SL tablet Place 1 tablet (0.6 mg total) under the tongue every 5 (five) minutes as needed for chest pain. 11/21/22   Ngetich, Dinah C, NP  olmesartan  (BENICAR) 20 MG tablet Take 1 tablet (20 mg total) by mouth at bedtime for high blood pressure. 04/04/23     potassium chloride SA (KLOR-CON M) 20 MEQ tablet Take 1 tablet (20 mEq total) by mouth daily. 04/10/23   [name], MD  sevelamer carbonate (RENVELA) 800 MG tablet Take 1 tablet (800 mg total) by mouth 3 (three) times daily with meals. 04/08/23     torsemide (DEMADEX) 20 MG tablet Take 2 tablets (40 mg total) by mouth daily. 02/15/23 03/17/23  Arrien, York Ram, MD      Allergies    Honey bee venom Alphonsus Sias venom], Penicillins, and Tomato    Review of Systems   Review of Systems  Physical Exam Updated Vital Signs BP 118/68   Pulse 71   Temp 98.5 F (36.9 C) (Oral)   Resp (!) 22   SpO2 99%  Physical Exam Vitals and nursing note reviewed.  Constitutional:      General: He is not in acute distress.    Appearance: He is well-developed.  HENT:     Head: Normocephalic and atraumatic.     Mouth/Throat:     Mouth: Mucous membranes are moist.  Eyes:     General: No scleral icterus.    Conjunctiva/sclera: Conjunctivae normal.  Cardiovascular:     Rate and Rhythm: Normal rate and regular rhythm.  Pulmonary:     Effort: Pulmonary effort is normal. No respiratory distress.     Breath  sounds: Normal breath sounds. No wheezing, rhonchi or rales.  Abdominal:     Palpations: Abdomen is soft.     Tenderness: There is no abdominal tenderness.  Musculoskeletal:        General: No swelling.     Cervical back: Neck supple.  Skin:    General: Skin is warm and dry.     Capillary Refill: Capillary refill takes less than 2 seconds.  Neurological:     Mental Status: He is alert.  Psychiatric:        Mood and Affect: Mood normal.     ED Results / Procedures / Treatments   Labs (all labs ordered are listed, but only abnormal results are displayed) Labs Reviewed  BASIC METABOLIC PANEL - Abnormal; Notable for the following components:      Result Value   Potassium 3.4 (*)     Chloride 94 (*)    Glucose, Bld 105 (*)    BUN 28 (*)    Creatinine, Ser 7.05 (*)    Calcium 8.3 (*)    GFR, Estimated 9 (*)    Anion gap 18 (*)    All other components within normal limits  CBC WITH DIFFERENTIAL/PLATELET - Abnormal; Notable for the following components:   Hemoglobin 11.9 (*)    HCT 37.0 (*)    MCV 71.6 (*)    MCH 23.0 (*)    RDW 17.4 (*)    All other components within normal limits  SARS CORONAVIRUS 2 BY RT PCR    EKG None  Radiology No results found.  Procedures Procedures    Medications Ordered in ED Medications  dexamethasone (DECADRON) injection 10 mg (10 mg Intramuscular Given 04/12/23 2335)  prochlorperazine (COMPAZINE) tablet 10 mg (10 mg Oral Given 04/12/23 2335)    ED Course/ Medical Decision Making/ A&P                                 Medical Decision Making Amount and/or Complexity of Data Reviewed Labs: ordered.  Risk Prescription drug management.   This patient presents to the ED for concern of bodyaches and chills, this involves an extensive number of treatment options, and is a complaint that carries with it a high risk of complications and morbidity.  The differential diagnosis includes COVID-19, influenza, other viral illnesses, and others   Co morbidities that complicate the patient evaluation  Hemodialysis patient, COPD, heart failure   Additional history obtained:  Additional history obtained from visitor at bedside External records from outside source obtained and reviewed including vascular surgery notes   Lab Tests:  I Ordered, and personally interpreted labs.  The pertinent results include: BMP and CBC appear to be grossly at baseline, negative COVID test  Problem List / ED Course / Critical interventions / Medication management   I ordered medication including Decadron and Compazine for headache Reevaluation of the patient after these medicines showed that the patient improved I have reviewed the patients  home medicines and have made adjustments as needed   Social Determinants of Health:  Patient has history of housing insecurity   Test / Admission - Considered:  The patient's headache and nausea improved with medication.  No red flag symptoms such as sudden onset, worst headache of life to necessitate head CT at this time.  COVID test was negative.  Symptoms overall sound like a viral process.  Lab work was grossly unremarkable.  See no indication for  further emergent workup or admission at this time.  Plan to discharge home with recommendation for supportive care with Tylenol and return precautions.  Patient voices understanding with plan         Final Clinical Impression(s) / ED Diagnoses Final diagnoses:  Generalized body aches  Acute nonintractable headache, unspecified headache type    Rx / DC Orders ED Discharge Orders     None         Pamala Duffel 04/13/23 0210    Sabas Sous, MD 04/13/23 [phone]

## 2023-04-15 DIAGNOSIS — E8770 Fluid overload, unspecified: Secondary | ICD-10-CM | POA: Diagnosis not present

## 2023-04-15 DIAGNOSIS — Z992 Dependence on renal dialysis: Secondary | ICD-10-CM | POA: Diagnosis not present

## 2023-04-15 DIAGNOSIS — N2581 Secondary hyperparathyroidism of renal origin: Secondary | ICD-10-CM | POA: Diagnosis not present

## 2023-04-15 DIAGNOSIS — N186 End stage renal disease: Secondary | ICD-10-CM | POA: Diagnosis not present

## 2023-04-16 DIAGNOSIS — Z992 Dependence on renal dialysis: Secondary | ICD-10-CM | POA: Diagnosis not present

## 2023-04-16 DIAGNOSIS — N186 End stage renal disease: Secondary | ICD-10-CM | POA: Diagnosis not present

## 2023-04-16 DIAGNOSIS — E8770 Fluid overload, unspecified: Secondary | ICD-10-CM | POA: Diagnosis not present

## 2023-04-16 DIAGNOSIS — N2581 Secondary hyperparathyroidism of renal origin: Secondary | ICD-10-CM | POA: Diagnosis not present

## 2023-04-18 DIAGNOSIS — Z992 Dependence on renal dialysis: Secondary | ICD-10-CM | POA: Diagnosis not present

## 2023-04-18 DIAGNOSIS — E8770 Fluid overload, unspecified: Secondary | ICD-10-CM | POA: Diagnosis not present

## 2023-04-18 DIAGNOSIS — N2581 Secondary hyperparathyroidism of renal origin: Secondary | ICD-10-CM | POA: Diagnosis not present

## 2023-04-18 DIAGNOSIS — N186 End stage renal disease: Secondary | ICD-10-CM | POA: Diagnosis not present

## 2023-04-19 DIAGNOSIS — E8770 Fluid overload, unspecified: Secondary | ICD-10-CM | POA: Diagnosis not present

## 2023-04-19 DIAGNOSIS — Z992 Dependence on renal dialysis: Secondary | ICD-10-CM | POA: Diagnosis not present

## 2023-04-19 DIAGNOSIS — N186 End stage renal disease: Secondary | ICD-10-CM | POA: Diagnosis not present

## 2023-04-19 DIAGNOSIS — N2581 Secondary hyperparathyroidism of renal origin: Secondary | ICD-10-CM | POA: Diagnosis not present

## 2023-04-20 DIAGNOSIS — I120 Hypertensive chronic kidney disease with stage 5 chronic kidney disease or end stage renal disease: Secondary | ICD-10-CM | POA: Diagnosis not present

## 2023-04-20 DIAGNOSIS — N186 End stage renal disease: Secondary | ICD-10-CM | POA: Diagnosis not present

## 2023-04-20 DIAGNOSIS — Z992 Dependence on renal dialysis: Secondary | ICD-10-CM | POA: Diagnosis not present

## 2023-04-22 DIAGNOSIS — N2581 Secondary hyperparathyroidism of renal origin: Secondary | ICD-10-CM | POA: Diagnosis not present

## 2023-04-22 DIAGNOSIS — Z992 Dependence on renal dialysis: Secondary | ICD-10-CM | POA: Diagnosis not present

## 2023-04-22 DIAGNOSIS — N186 End stage renal disease: Secondary | ICD-10-CM | POA: Diagnosis not present

## 2023-04-22 DIAGNOSIS — E8770 Fluid overload, unspecified: Secondary | ICD-10-CM | POA: Diagnosis not present

## 2023-04-23 ENCOUNTER — Encounter (HOSPITAL_COMMUNITY): Payer: Self-pay | Admitting: Vascular Surgery

## 2023-04-23 ENCOUNTER — Other Ambulatory Visit: Payer: Self-pay

## 2023-04-23 DIAGNOSIS — E8770 Fluid overload, unspecified: Secondary | ICD-10-CM | POA: Diagnosis not present

## 2023-04-23 DIAGNOSIS — N186 End stage renal disease: Secondary | ICD-10-CM | POA: Diagnosis not present

## 2023-04-23 DIAGNOSIS — N2581 Secondary hyperparathyroidism of renal origin: Secondary | ICD-10-CM | POA: Diagnosis not present

## 2023-04-23 DIAGNOSIS — Z992 Dependence on renal dialysis: Secondary | ICD-10-CM | POA: Diagnosis not present

## 2023-04-23 NOTE — Progress Notes (Signed)
Anesthesia Chart Review:  Follows with cardiology for history of HFpEF,? Prior MI in [place] in 2010, difficult to control hypertension.  Nuclear stress test 08/2020 showed no evidence of ischemia or infarct.  Echo 02/14/2023 showed EF 50 to 55%, grade 3 diastolic dysfunction, large pericardial effusion without evidence of tamponade.  Per cardiology notes, effusion was felt likely secondary to uremia.  Recommended repeat echo in 3 months.  Recent admission 7/14 through 03/07/2023 with progression of stage V CKD to ESRD.  TDC was placed.  Vascular attempted to create fistula in LUE but procedure was canceled due to findings of infected hidradenitis suppurativa.  History of asthma, maintained on Trelegy Ellipta.  Diet controlled DM2.  OSA on CPAP.  History of multiple sclerosis by imaging 02/2021.  Not currently followed by neurology.  Scheduled to establish with neurology in October.  Will need DOS labs and eval.   EKG 03/03/23: NSR. Rate 85. Possible LAE. LVH with repol abnormality. Cannot rule out septal infarct, age undetermined.  TTE 02/14/2023:  1. Left ventricular ejection fraction, by estimation, is 50 to 55%. The  left ventricle has low normal function. The left ventricle has no regional  wall motion abnormalities. There is severe concentric left ventricular  hypertrophy. Left ventricular  diastolic parameters are consistent with Grade III diastolic dysfunction  (restrictive).   2. Right ventricular systolic function is normal. The right ventricular  size is normal. Tricuspid regurgitation signal is inadequate for assessing  PA pressure.   3. Left atrial size was moderately dilated.   4. A large pericardial effusion is present and measures largest around  the RA best visualized on subcostal views. The pericardial effusion is  circumferential. There is no evidence of cardiac tamponade.   5. The mitral valve is grossly normal. Mild mitral valve regurgitation.   6. The aortic valve  is tricuspid. Aortic valve regurgitation is not  visualized. Aortic valve sclerosis is present, with no evidence of aortic  valve stenosis.   7. The inferior vena cava is dilated in size with <50% respiratory  variability, suggesting right atrial pressure of 15 mmHg.   Comparison(s): Compared to prior TTE in 08/2022, the pericardial effusion  is slightly larger around the RA but no evidence of tamponade physiology.   Cardiac MRI 01/04/2023: IMPRESSION: 1. Normal LV size with moderate LV hypertrophy. EF 49%, diffuse mild hypokinesis.   2.  Normal RV size and systolic function, EF 51%.   3. Subtle mid-wall LGE primarily in the basal to mid septum. This is nonspecific.   4. Borderline elevated extracellular volume percentage, suspect increased myocardial fibrosis in setting of hypertension.   5. Moderate pericardial effusion, primarily inferior to heart. No evidence for tamponade.  Nuclear stress 09/14/2020: The left ventricular ejection fraction is moderately decreased (30-44%). Nuclear stress EF: 42%. ST segment depression was noted during stress in the II, III, aVF and V6 leads, and returning to baseline after 1-5 minutes of recovery. No evidence of ischemia or infarct on nuclear perfusion study. This is an intermediate risk study due to moderately reduced LVEF.    Zannie Cove [facility] Short Stay Center/Anesthesiology Phone [phone] 04/23/2023 11:49 AM

## 2023-04-23 NOTE — Anesthesia Preprocedure Evaluation (Addendum)
Anesthesia Evaluation  Patient identified by MRN, date of birth, ID band Patient awake    Reviewed: Allergy & Precautions, NPO status , Patient's Chart, lab work & pertinent test results, reviewed documented beta blocker date and time   History of Anesthesia Complications Negative for: history of anesthetic complications  Airway Mallampati: I  TM Distance: >3 FB Neck ROM: Full    Dental  (+) Dental Advisory Given, Teeth Intact   Pulmonary asthma , sleep apnea and Continuous Positive Airway Pressure Ventilation , COPD,  COPD inhaler, Patient abstained from smoking., former smoker   Pulmonary exam normal        Cardiovascular hypertension, Pt. on home beta blockers and Pt. on medications + CAD and + Past MI  Normal cardiovascular exam   '24 TTE - EF 50 to 55%. There is severe concentric left ventricular hypertrophy. Grade III diastolic dysfunction (restrictive). Left atrial size was moderately dilated. A large pericardial effusion is present and measures largest around the RA best visualized on subcostal views. The pericardial effusion is circumferential. There is no evidence of cardiac tamponade. Mild MR.    Neuro/Psych  PSYCHIATRIC DISORDERS Anxiety      Multiple sclerosis  CVA, No Residual Symptoms    GI/Hepatic negative GI ROS, Neg liver ROS,,,  Endo/Other  diabetes    Renal/GU ESRF and DialysisRenal disease     Musculoskeletal negative musculoskeletal ROS (+)    Abdominal   Peds  Hematology  (+) Blood dyscrasia, anemia   Anesthesia Other Findings   Reproductive/Obstetrics                             Anesthesia Physical Anesthesia Plan  ASA: 3  Anesthesia Plan: Regional   Post-op Pain Management: Regional block* and Tylenol PO (pre-op)*   Induction:   PONV Risk Score and Plan: 1 and Propofol infusion and Treatment may vary due to age or medical condition  Airway Management  Planned: Natural Airway and Simple Face Mask  Additional Equipment: None  Intra-op Plan:   Post-operative Plan:   Informed Consent: I have reviewed the patients History and Physical, chart, labs and discussed the procedure including the risks, benefits and alternatives for the proposed anesthesia with the patient or authorized representative who has indicated his/her understanding and acceptance.       Plan Discussed with: CRNA and Anesthesiologist  Anesthesia Plan Comments: (See PAT note  )        Anesthesia Quick Evaluation

## 2023-04-23 NOTE — Progress Notes (Signed)
SDW call  Patient was given pre-op instructions over the phone. Patient verbalized understanding of instructions provided.     PCP - Dr. [name] Cardiologist - Dr. York Ram Pulmonary:    PPM/ICD - denies Device Orders - n/a Rep Notified - n/a   Chest x-ray - 02/12/2023 EKG -  03/04/2023 Stress Test - 09/14/2020 ECHO - 02/14/2023 Cardiac Cath -   Sleep Study/sleep apnea/CPAP: denies  Type II diabetic.  States he does not check his blood sugar and takes no medication for diabetes.  Fasting Blood sugar range: does not check How often check sugars: does not check  Blood Thinner Instructions: denies Aspirin Instructions:continue   ERAS Protcol - No, NPO   COVID TEST- n/a    Anesthesia review: Yes.  CAD, HTN, MI, stroke, DM, angina, sleep apnea, CKD with dialysis, MS   Patient denies shortness of breath, fever, cough and chest pain over the phone call  Your procedure is scheduled on Wednesday April 24, 2023   Report to [facility] Main Entrance "A" at  1015  A.M., then check in with the Admitting office.  Call this number if you have problems the morning of surgery:  [phone]   If you have any questions prior to your surgery date call [phone]: Open Monday-Friday 8am-4pm If you experience any cold or flu symptoms such as cough, fever, chills, shortness of breath, etc. between now and your scheduled surgery, please notify us at the above number     Remember:  Do not eat or drink after midnight the night before your surgery  Take these medicines the morning of surgery with A SIP OF WATER:  Amlodipine, asa, storvastatin, carvedilol, pepcid, trelegy ellipta, renvela, doxycycline  As of today, STOP taking any Aleve, Naproxen, Ibuprofen, Motrin, Advil, Goody's, BC's, all herbal medications, fish oil, and all vitamins.

## 2023-04-24 ENCOUNTER — Ambulatory Visit: Payer: Self-pay

## 2023-04-24 ENCOUNTER — Ambulatory Visit (HOSPITAL_COMMUNITY)
Admission: RE | Admit: 2023-04-24 | Discharge: 2023-04-24 | Disposition: A | Discharge status: Home / Self Care | Payer: 59 | Attending: Vascular Surgery | Admitting: Vascular Surgery

## 2023-04-24 ENCOUNTER — Telehealth: Payer: Self-pay | Admitting: Vascular Surgery

## 2023-04-24 ENCOUNTER — Encounter (HOSPITAL_COMMUNITY)
Admission: RE | Disposition: A | Discharge status: Home / Self Care | Payer: Self-pay | Source: Home / Self Care | Attending: Vascular Surgery

## 2023-04-24 ENCOUNTER — Ambulatory Visit (HOSPITAL_BASED_OUTPATIENT_CLINIC_OR_DEPARTMENT_OTHER): Payer: 59 | Admitting: Physician Assistant

## 2023-04-24 ENCOUNTER — Other Ambulatory Visit: Payer: Self-pay

## 2023-04-24 ENCOUNTER — Ambulatory Visit (HOSPITAL_COMMUNITY): Payer: 59 | Admitting: Physician Assistant

## 2023-04-24 ENCOUNTER — Encounter (HOSPITAL_COMMUNITY): Payer: Self-pay | Admitting: Vascular Surgery

## 2023-04-24 DIAGNOSIS — N185 Chronic kidney disease, stage 5: Secondary | ICD-10-CM | POA: Diagnosis not present

## 2023-04-24 DIAGNOSIS — G35 Multiple sclerosis: Secondary | ICD-10-CM | POA: Insufficient documentation

## 2023-04-24 DIAGNOSIS — N186 End stage renal disease: Secondary | ICD-10-CM | POA: Insufficient documentation

## 2023-04-24 DIAGNOSIS — Z992 Dependence on renal dialysis: Secondary | ICD-10-CM | POA: Diagnosis not present

## 2023-04-24 DIAGNOSIS — I340 Nonrheumatic mitral (valve) insufficiency: Secondary | ICD-10-CM | POA: Diagnosis not present

## 2023-04-24 DIAGNOSIS — I3139 Other pericardial effusion (noninflammatory): Secondary | ICD-10-CM | POA: Insufficient documentation

## 2023-04-24 DIAGNOSIS — I132 Hypertensive heart and chronic kidney disease with heart failure and with stage 5 chronic kidney disease, or end stage renal disease: Secondary | ICD-10-CM | POA: Insufficient documentation

## 2023-04-24 DIAGNOSIS — I2510 Atherosclerotic heart disease of native coronary artery without angina pectoris: Secondary | ICD-10-CM | POA: Diagnosis not present

## 2023-04-24 DIAGNOSIS — I252 Old myocardial infarction: Secondary | ICD-10-CM | POA: Diagnosis not present

## 2023-04-24 DIAGNOSIS — Z87891 Personal history of nicotine dependence: Secondary | ICD-10-CM | POA: Diagnosis not present

## 2023-04-24 DIAGNOSIS — Z7951 Long term (current) use of inhaled steroids: Secondary | ICD-10-CM | POA: Insufficient documentation

## 2023-04-24 DIAGNOSIS — G4730 Sleep apnea, unspecified: Secondary | ICD-10-CM | POA: Insufficient documentation

## 2023-04-24 DIAGNOSIS — J4489 Other specified chronic obstructive pulmonary disease: Secondary | ICD-10-CM | POA: Insufficient documentation

## 2023-04-24 DIAGNOSIS — I5032 Chronic diastolic (congestive) heart failure: Secondary | ICD-10-CM | POA: Diagnosis not present

## 2023-04-24 DIAGNOSIS — E1122 Type 2 diabetes mellitus with diabetic chronic kidney disease: Secondary | ICD-10-CM | POA: Diagnosis not present

## 2023-04-24 DIAGNOSIS — L732 Hidradenitis suppurativa: Secondary | ICD-10-CM | POA: Insufficient documentation

## 2023-04-24 DIAGNOSIS — G8918 Other acute postprocedural pain: Secondary | ICD-10-CM | POA: Diagnosis not present

## 2023-04-24 HISTORY — PX: AV FISTULA PLACEMENT: SHX1204

## 2023-04-24 LAB — POCT I-STAT, CHEM 8
BUN: 44 mg/dL — ABNORMAL HIGH (ref 6–20)
Calcium, Ion: 1.1 mmol/L — ABNORMAL LOW (ref 1.15–1.40)
Chloride: 95 mmol/L — ABNORMAL LOW (ref 98–111)
Creatinine, Ser: 7.3 mg/dL — ABNORMAL HIGH (ref 0.61–1.24)
Glucose, Bld: 74 mg/dL (ref 70–99)
HCT: 43 % (ref 39.0–52.0)
Hemoglobin: 14.6 g/dL (ref 13.0–17.0)
Potassium: 3.6 mmol/L (ref 3.5–5.1)
Sodium: 136 mmol/L (ref 135–145)
TCO2: 29 mmol/L (ref 22–32)

## 2023-04-24 LAB — GLUCOSE, CAPILLARY
Glucose-Capillary: 66 mg/dL — ABNORMAL LOW (ref 70–99)
Glucose-Capillary: 75 mg/dL (ref 70–99)
Glucose-Capillary: 105 mg/dL — ABNORMAL HIGH (ref 70–99)
Glucose-Capillary: 64 mg/dL — ABNORMAL LOW (ref 70–99)
Glucose-Capillary: 95 mg/dL (ref 70–99)

## 2023-04-24 SURGERY — INSERTION OF ARTERIOVENOUS (AV) GORE-TEX GRAFT ARM
Anesthesia: Regional | Site: Arm Upper | Laterality: Left

## 2023-04-24 MED ORDER — CHLORHEXIDINE GLUCONATE 0.12 % MT SOLN: 15.0000 mL | OROMUCOSAL | Status: AC

## 2023-04-24 MED ORDER — ACETAMINOPHEN 500 MG PO TABS
ORAL_TABLET | ORAL | Status: AC
  Administered 2023-04-24: 1000 mg via ORAL
  Filled 2023-04-24: qty 2

## 2023-04-24 MED ORDER — DEXTROSE 50 % IV SOLN
INTRAVENOUS | Status: AC
  Administered 2023-04-24: 50 mL
  Filled 2023-04-24: qty 50

## 2023-04-24 MED ORDER — HEPARIN 6000 UNIT IRRIGATION SOLUTION
Status: AC
  Filled 2023-04-24: qty 500

## 2023-04-24 MED ORDER — PROPOFOL 10 MG/ML IV BOLUS
INTRAVENOUS | Status: AC
  Filled 2023-04-24: qty 20

## 2023-04-24 MED ORDER — FENTANYL CITRATE (PF) 100 MCG/2ML IJ SOLN: 50.0000 ug | Freq: Once | INTRAMUSCULAR | Status: AC

## 2023-04-24 MED ORDER — DEXTROSE 50 % IV SOLN
INTRAVENOUS | Status: AC
  Administered 2023-04-24: 25 mL via INTRAVENOUS
  Filled 2023-04-24: qty 50

## 2023-04-24 MED ORDER — VANCOMYCIN HCL IN DEXTROSE 1-5 GM/200ML-% IV SOLN
INTRAVENOUS | Status: AC
  Administered 2023-04-24: 1000 mg via INTRAVENOUS
  Filled 2023-04-24: qty 200

## 2023-04-24 MED ORDER — CHLORHEXIDINE GLUCONATE 4 % EX SOLN: 60.0000 mL | Freq: Once | CUTANEOUS | Status: DC

## 2023-04-24 MED ORDER — CHLORHEXIDINE GLUCONATE 0.12 % MT SOLN
OROMUCOSAL | Status: AC
  Administered 2023-04-24: 15 mL via OROMUCOSAL
  Filled 2023-04-24: qty 15

## 2023-04-24 MED ORDER — ONDANSETRON HCL 4 MG/2ML IJ SOLN
INTRAMUSCULAR | Status: AC
  Filled 2023-04-24: qty 2

## 2023-04-24 MED ORDER — LIDOCAINE-EPINEPHRINE (PF) 1 %-1:200000 IJ SOLN
INTRAMUSCULAR | Status: AC
  Filled 2023-04-24: qty 30

## 2023-04-24 MED ORDER — OXYCODONE-ACETAMINOPHEN 5-325 MG PO TABS: 1.0000 | ORAL_TABLET | Freq: Four times a day (QID) | ORAL | 0 refills | Status: DC | PRN

## 2023-04-24 MED ORDER — MEPIVACAINE HCL (PF) 1.5 % IJ SOLN
INTRAMUSCULAR | Status: DC | PRN
Start: 2023-04-24 — End: 2023-04-24
  Administered 2023-04-24: 20 mL via PERINEURAL

## 2023-04-24 MED ORDER — ACETAMINOPHEN 500 MG PO TABS: 1000.0000 mg | ORAL_TABLET | ORAL | Status: AC

## 2023-04-24 MED ORDER — VANCOMYCIN HCL IN DEXTROSE 1-5 GM/200ML-% IV SOLN: 1000.0000 mg | INTRAVENOUS | Status: AC

## 2023-04-24 MED ORDER — PAPAVERINE HCL 30 MG/ML IJ SOLN
INTRAMUSCULAR | Status: AC
  Filled 2023-04-24: qty 2

## 2023-04-24 MED ORDER — DEXTROSE 50 % IV SOLN
25.0000 mL | Freq: Once | INTRAVENOUS | Status: AC
  Filled 2023-04-24: qty 50

## 2023-04-24 MED ORDER — MIDAZOLAM HCL 2 MG/2ML IJ SOLN
INTRAMUSCULAR | Status: AC
  Administered 2023-04-24: 1 mg via INTRAVENOUS
  Filled 2023-04-24: qty 2

## 2023-04-24 MED ORDER — DEXTROSE 50 % IV SOLN: 50.0000 mL | Freq: Once | INTRAVENOUS | Status: DC

## 2023-04-24 MED ORDER — DEXAMETHASONE SODIUM PHOSPHATE 10 MG/ML IJ SOLN
INTRAMUSCULAR | Status: DC | PRN
Start: 2023-04-24 — End: 2023-04-24
  Administered 2023-04-24: 5 mg via INTRAVENOUS

## 2023-04-24 MED ORDER — 0.9 % SODIUM CHLORIDE (POUR BTL) OPTIME
TOPICAL | Status: DC | PRN
  Administered 2023-04-24: 1000 mL

## 2023-04-24 MED ORDER — DEXAMETHASONE SODIUM PHOSPHATE 10 MG/ML IJ SOLN
INTRAMUSCULAR | Status: AC
  Filled 2023-04-24: qty 1

## 2023-04-24 MED ORDER — HEPARIN 6000 UNIT IRRIGATION SOLUTION
Status: DC | PRN
  Administered 2023-04-24: 1

## 2023-04-24 MED ORDER — ONDANSETRON HCL 4 MG/2ML IJ SOLN
INTRAMUSCULAR | Status: DC | PRN
  Administered 2023-04-24: 4 mg via INTRAVENOUS

## 2023-04-24 MED ORDER — FENTANYL CITRATE (PF) 100 MCG/2ML IJ SOLN
INTRAMUSCULAR | Status: AC
  Administered 2023-04-24: 50 ug via INTRAVENOUS
  Filled 2023-04-24: qty 2

## 2023-04-24 MED ORDER — OXYCODONE-ACETAMINOPHEN 5-325 MG PO TABS
1.0000 | ORAL_TABLET | Freq: Four times a day (QID) | ORAL | 0 refills | Status: DC | PRN
Start: 2023-04-24 — End: 2023-10-02

## 2023-04-24 MED ORDER — SODIUM CHLORIDE 0.9 % IV SOLN: INTRAVENOUS | Status: DC

## 2023-04-24 MED ORDER — DEXTROSE 50 % IV SOLN
25.0000 mL | Freq: Once | INTRAVENOUS | Status: AC
  Administered 2023-04-24: 25 mL via INTRAVENOUS

## 2023-04-24 MED ORDER — MIDAZOLAM HCL 2 MG/2ML IJ SOLN: 1.0000 mg | Freq: Once | INTRAMUSCULAR | Status: AC

## 2023-04-24 MED ORDER — PROPOFOL 500 MG/50ML IV EMUL
INTRAVENOUS | Status: DC | PRN
  Administered 2023-04-24: 75 ug/kg/min via INTRAVENOUS

## 2023-04-24 SURGICAL SUPPLY — 42 items
APL PRP STRL LF DISP 70% ISPRP (MISCELLANEOUS) ×1
APL SKNCLS STERI-STRIP NONHPOA (GAUZE/BANDAGES/DRESSINGS) ×1
ARMBAND PINK RESTRICT EXTREMIT (MISCELLANEOUS) ×1 IMPLANT
BENZOIN TINCTURE PRP APPL 2/3 (GAUZE/BANDAGES/DRESSINGS) ×1 IMPLANT
CANISTER SUCT 3000ML PPV (MISCELLANEOUS) ×1 IMPLANT
CANNULA VESSEL 3MM 2 BLNT TIP (CANNULA) ×1 IMPLANT
CHLORAPREP W/TINT 26 (MISCELLANEOUS) ×1 IMPLANT
CLIP LIGATING EXTRA MED SLVR (CLIP) ×1 IMPLANT
CLIP LIGATING EXTRA SM BLUE (MISCELLANEOUS) ×1 IMPLANT
CLSR STERI-STRIP ANTIMIC 1/2X4 (GAUZE/BANDAGES/DRESSINGS) IMPLANT
DRSG TEGADERM 4X4.75 (GAUZE/BANDAGES/DRESSINGS) IMPLANT
ELECT REM PT RETURN 9FT ADLT (ELECTROSURGICAL) ×1
ELECTRODE REM PT RTRN 9FT ADLT (ELECTROSURGICAL) ×1 IMPLANT
GAUZE SPONGE 4X4 12PLY STRL LF (GAUZE/BANDAGES/DRESSINGS) IMPLANT
GLOVE BIO SURGEON STRL SZ8 (GLOVE) ×1 IMPLANT
GOWN STRL REUS W/ TWL LRG LVL3 (GOWN DISPOSABLE) ×2 IMPLANT
GOWN STRL REUS W/ TWL XL LVL3 (GOWN DISPOSABLE) ×1 IMPLANT
GOWN STRL REUS W/TWL LRG LVL3 (GOWN DISPOSABLE) ×2
GOWN STRL REUS W/TWL XL LVL3 (GOWN DISPOSABLE) ×1
GRAFT GORETEX STRT 4-7X45 (Vascular Products) IMPLANT
HEMOSTAT SNOW SURGICEL 2X4 (HEMOSTASIS) IMPLANT
KIT BASIN OR (CUSTOM PROCEDURE TRAY) ×1 IMPLANT
KIT TURNOVER KIT B (KITS) ×1 IMPLANT
NDL 18GX1X1/2 (RX/OR ONLY) (NEEDLE) IMPLANT
NEEDLE 18GX1X1/2 (RX/OR ONLY) (NEEDLE) IMPLANT
NS IRRIG 1000ML POUR BTL (IV SOLUTION) ×1 IMPLANT
PACK CV ACCESS (CUSTOM PROCEDURE TRAY) ×1 IMPLANT
PAD ARMBOARD 7.5X6 YLW CONV (MISCELLANEOUS) ×2 IMPLANT
SHEATH PROBE COVER 6X72 (BAG) ×1 IMPLANT
SLING ARM FOAM STRAP LRG (SOFTGOODS) IMPLANT
SLING ARM FOAM STRAP MED (SOFTGOODS) IMPLANT
STRIP CLOSURE SKIN 1/2X4 (GAUZE/BANDAGES/DRESSINGS) ×1 IMPLANT
SUT MNCRL AB 4-0 PS2 18 (SUTURE) IMPLANT
SUT PROLENE 6 0 BV (SUTURE) IMPLANT
SUT SILK 2 0 SH (SUTURE) IMPLANT
SUT VIC AB 3-0 SH 27 (SUTURE) ×2
SUT VIC AB 3-0 SH 27X BRD (SUTURE) ×2 IMPLANT
SYR 3ML LL SCALE MARK (SYRINGE) IMPLANT
SYR TOOMEY 50ML (SYRINGE) IMPLANT
TOWEL GREEN STERILE (TOWEL DISPOSABLE) ×1 IMPLANT
UNDERPAD 30X36 HEAVY ABSORB (UNDERPADS AND DIAPERS) ×1 IMPLANT
WATER STERILE IRR 1000ML POUR (IV SOLUTION) ×1 IMPLANT

## 2023-04-24 NOTE — Patient Instructions (Signed)
Visit Information  Thank you for taking time to visit with me today. Please don't hesitate to contact me if I can be of assistance to you.   Following are the goals we discussed today:   Goals Addressed             This Visit's Progress    To schedule a follow up visit with kidney specialist   On track    Care Coordination Interventions: Assessed the Patient understanding of chronic kidney disease stage V  Evaluation of current treatment plan related to chronic kidney disease self management and patient's adherence to plan as established by provider     Determined patient experienced an ED visit for symptoms including body aches Review of patient status, including review of consultant's reports, relevant laboratory and other test results, and medications completed Determined patient continues to have a Quinton catheter in order to receive his dialysis treatments, determined patient has experienced mild body aches x 2 weeks, blood cultures were not collected during his ED visit Reviewed upcoming scheduled vascular procedure for AVF, scheduled for today Discussed with patient the importance to report his symptoms to his Nephrologist during his next scheduled HD treatment scheduled for tomorrow Reviewed and discussed with patient his self referral to [facility] for renal transplant, his initial visit was completed and patient is a candidate for complete work up Last practice recorded BP readings:  BP Readings from Last 3 Encounters:  04/24/23 (!) 166/92  04/13/23 124/74  04/09/23 (!) 152/83   Most recent eGFR/CrCl:  Lab Results  Component Value Date   EGFR 14 (L) 03/01/2023    No components found for: "CRCL"          Our next appointment is by telephone on 05/15/23 at 11:30 AM  Please call the care guide team at [phone] if you need to cancel or reschedule your appointment.   If you are experiencing a Mental Health or Behavioral Health Crisis or need someone to talk to,  please call 1-800-273-TALK (toll free, 24 hour hotline)  Patient verbalizes understanding of instructions and care plan provided today and agrees to view in MyChart. Active MyChart status and patient understanding of how to access instructions and care plan via MyChart confirmed with patient.     [name], RN, BSN, CCM Care Management Coordinator [facility] Care Management  Direct Phone: [phone]

## 2023-04-24 NOTE — Anesthesia Procedure Notes (Addendum)
Anesthesia Regional Block: Supraclavicular block   Pre-Anesthetic Checklist: , timeout performed,  Correct Patient, Correct Site, Correct Laterality,  Correct Procedure, Correct Position, site marked,  Risks and benefits discussed,  Surgical consent,  Pre-op evaluation,  At surgeon's request and post-op pain management  Laterality: Left  Prep: chloraprep       Needles:  Injection technique: Single-shot  Needle Type: Echogenic Needle     Needle Length: 5cm  Needle Gauge: 21     Additional Needles:   Narrative:  Start time: 04/24/2023 12:49 PM End time: 04/24/2023 12:52 PM Injection made incrementally with aspirations every 5 mL.  Performed by: Personally  Anesthesiologist: Beryle Lathe, MD  Additional Notes: No pain on injection. No increased resistance to injection. Injection made in 5cc increments. Good needle visualization. Patient tolerated the procedure well.

## 2023-04-24 NOTE — Patient Outreach (Signed)
  Care Coordination   Follow Up Visit Note   04/24/2023 Name: George Day MRN: [id number] DOB: Jun 09, 1976  George Day is a 47 y.o. year old male who sees [name], MD for primary care. I spoke with  George Day by phone today.  What matters to the patients health and wellness today?  Patient would like to complete his AVF procedure without complications. He would like to work toward being placed on the renal transplant list.     Goals Addressed             This Visit's Progress    To schedule a follow up visit with kidney specialist   On track    Care Coordination Interventions: Assessed the Patient understanding of chronic kidney disease stage V  Evaluation of current treatment plan related to chronic kidney disease self management and patient's adherence to plan as established by provider     Determined patient experienced an ED visit for symptoms including body aches Review of patient status, including review of consultant's reports, relevant laboratory and other test results, and medications completed Determined patient continues to have a Quinton catheter in order to receive his dialysis treatments, determined patient has experienced mild body aches x 2 weeks, blood cultures were not collected during his ED visit Reviewed upcoming scheduled vascular procedure for AVF, scheduled for today Discussed with patient the importance to report his symptoms to his Nephrologist during his next scheduled HD treatment scheduled for tomorrow Reviewed and discussed with patient his self referral to [facility] for renal transplant, his initial visit was completed and patient is a candidate for complete work up Last practice recorded BP readings:  BP Readings from Last 3 Encounters:  04/24/23 (!) 166/92  04/13/23 124/74  04/09/23 (!) 152/83   Most recent eGFR/CrCl:  Lab Results  Component Value Date   EGFR 14 (L) 03/01/2023    No components found for: "CRCL"        Interventions Today    Flowsheet Row Most Recent Value  Chronic Disease   Chronic disease during today's visit Chronic Kidney Disease/End Stage Renal Disease (ESRD)  General Interventions   General Interventions Discussed/Reviewed General Interventions Discussed, General Interventions Reviewed, Doctor Visits  Doctor Visits Discussed/Reviewed Doctor Visits Discussed, Doctor Visits Reviewed, Specialist  Education Interventions   Education Provided Provided Education  Provided Verbal Education On When to see the doctor          SDOH assessments and interventions completed:  No     Care Coordination Interventions:  Yes, provided   Follow up plan: Follow up call scheduled for 05/13/23 @11 :30 AM    Encounter Outcome:  Patient Visit Completed

## 2023-04-24 NOTE — Anesthesia Postprocedure Evaluation (Signed)
Anesthesia Post Note  Patient: George Day  Procedure(s) Performed: INSERTION OF LEFT ARM ARTERIOVENOUS (AV) GOR-TEX GRAFT (Left: Arm Upper)     Patient location during evaluation: PACU Anesthesia Type: Regional Level of consciousness: awake and alert Pain management: pain level controlled Vital Signs Assessment: post-procedure vital signs reviewed and stable Respiratory status: spontaneous breathing, nonlabored ventilation and respiratory function stable Cardiovascular status: stable and blood pressure returned to baseline Anesthetic complications: no   No notable events documented.  Last Vitals:  Vitals:   04/24/23 1500 04/24/23 1510  BP: (!) 166/92   Pulse: 68 65  Resp: 20 20  Temp:  36.5 C  SpO2: 100% 99%    Last Pain:  Vitals:   04/24/23 1440  TempSrc:   PainSc: Asleep                 Beryle Lathe

## 2023-04-24 NOTE — Op Note (Signed)
DATE OF SERVICE: 04/24/2023  PATIENT:  George Day  47 y.o. male  PRE-OPERATIVE DIAGNOSIS:  ESRD  POST-OPERATIVE DIAGNOSIS:  Same  PROCEDURE:   Creation of left arteriovenous graft  SURGEON:  Surgeons and Role:    * Leonie Douglas, MD - Primary  ASSISTANT: Doreatha Massed, PA-C  An experienced assistant was required given the complexity of this procedure and the standard of surgical care. My assistant helped with exposure through counter tension, suctioning, ligation and retraction to better visualize the surgical field.  My assistant expedited sewing during the case by following my sutures. Wherever I use the term "we" in the report, my assistant actively helped me with that portion of the procedure.  ANESTHESIA:   regional and MAC  EBL: minimal  BLOOD ADMINISTERED:none  DRAINS: none   LOCAL MEDICATIONS USED:  NONE  SPECIMEN:  none  COUNTS: confirmed correct.  TOURNIQUET:  none  PATIENT DISPOSITION:  PACU - hemodynamically stable.   Delay start of Pharmacological VTE agent (>24hrs) due to surgical blood loss or risk of bleeding: no  INDICATION FOR PROCEDURE: George Day is a 47 y.o. male with ESRD. After careful discussion of risks, benefits, and alternatives the patient was offered left arm arteriovenous graft. The patient understood and wished to proceed.  OPERATIVE FINDINGS: high brachial bifurcation. Ulnar artery to brachial vein graft created. Good doppler flow at completion.  DESCRIPTION OF PROCEDURE: After identification of the patient in the pre-operative holding area, the patient was transferred to the operating room. The patient was positioned supine on the operating room table. Anesthesia was induced. The left arm was prepped and draped in standard fashion. A surgical pause was performed confirming correct patient, procedure, and operative location.  The left ulnar artery was exposed using a longitudinal incision in the distal arm just above the  antecubital fossa.  Incision was carried down through subcutaneous tissue until the brachial sheath was encountered.  This was incised sharply.  The brachial artery was exposed and encircled with Silastic Vesseloops proximally and distally to the site of planned inflow.   The left brachial vein at the axilla was exposed using longitudinal incision just below the hairbearing area of the axilla.  Incision was carried down until the brachial sheath was encountered.  The brachial vein was identified, exposed, encircled with Silastic Vesseloops.   Using a curved, sheathed tunneling device, a 4-7 mm tapered Gore-Tex graft was tunneled subcutaneously and gentle arc across the biceps of the left arm.  Patient was then heparinized with 5000 units of IV heparin.   The brachial artery was clamped proximally and distally.  An anterior arteriotomy was made with an 11 blade.  This was extended with Potts scissors.  The 4 mm end of the Gore-Tex graft was spatulated and then anastomosed end-to-side to the brachial arteriotomy using continuous running suture of 6-0 Prolene.  The anastomosis was completed and hemostasis ensured.  The graft was clamped to restore perfusion to the hand.   The brachial vein was clamped proximally and distally.  An anterior venotomy was made with an 11 blade.  This was extended with Potts scissors.  The 7 mm end of the Gore-Tex graft was then anastomosed end to side to the brachial vein venotomy using continuous running suture of 6-0 Prolene.  Immediately prior to completion the anastomosis was de-aired and flushed.  Anastomosis was then completed.  Hemostasis was insured.   Doppler machine was brought onto the field to interrogate the graft.   Doppler flow was  noted in the radial artery.  About the arterial anastomosis flow was noted proximal and distal to the arterial anastomosis.  Distal to the venous anastomosis a Doppler bruit was heard.  Satisfied we ended the case here.   Surgical beds  were irrigated copiously.  Hemostasis was again ensured in the surgical beds.  The wounds were closed in layers using 3-0 Vicryl and 4-0 Monocryl.  Clean bandages were applied.  Upon completion of the case instrument and sharps counts were confirmed correct. The patient was transferred to the PACU in good condition. I was present for all portions of the procedure.  FOLLOW UP PLAN: Assuming a normal postoperative course, VVS PA will see the patient in 4 weeks with AVF duplex.   George Day. George Antu, MD [facility] Phone Number: [phone] 04/24/2023 2:36 PM

## 2023-04-24 NOTE — Anesthesia Procedure Notes (Addendum)
Procedure Name: MAC Date/Time: 04/24/2023 1:13 PM  Performed by: Sharyn Dross, CRNAPre-anesthesia Checklist: Emergency Drugs available, Suction available, Patient identified, Patient being monitored and Timeout performed Patient Re-evaluated:Patient Re-evaluated prior to induction Oxygen Delivery Method: Simple face mask Preoxygenation: Pre-oxygenation with 100% oxygen Induction Type: IV induction Placement Confirmation: positive ETCO2

## 2023-04-24 NOTE — Progress Notes (Signed)
Hypoglycemic Event  CBG: 66  Treatment: D50 25 mL (12.5 gm)  Symptoms: None  Follow-up CBG: Time:1045 CBG Result:75  Possible Reasons for Event: Inadequate meal intake  Comments/MD notified:Brock made aware, stated to give another Dextrose 25ml for a total of 50ml. Orders received and carried out.    Venissa Nappi A Blondine Hottel

## 2023-04-24 NOTE — Discharge Instructions (Signed)
   [facility]  Discharge Instructions  AV Fistula or Graft Surgery for Dialysis Access  Please refer to the following instructions for your post-procedure care. Your surgeon or physician assistant will discuss any changes with you.  Activity  You may drive the day following your surgery, if you are comfortable and no longer taking prescription pain medication. Resume full activity as the soreness in your incision resolves.  Bathing/Showering  You may shower after you go home. Keep your incision dry for 48 hours. Do not soak in a bathtub, hot tub, or swim until the incision heals completely. You may not shower if you have a hemodialysis catheter.  Incision Care  Clean your incision with mild soap and water after 48 hours. Pat the area dry with a clean towel. You do not need a bandage unless otherwise instructed. Do not apply any ointments or creams to your incision. You may have skin glue on your incision. Do not peel it off. It will come off on its own in about one week. Your arm may swell a bit after surgery. To reduce swelling use pillows to elevate your arm so it is above your heart. Your doctor will tell you if you need to lightly wrap your arm with an ACE bandage.  Diet  Resume your normal diet. There are not special food restrictions following this procedure. In order to heal from your surgery, it is CRITICAL to get adequate nutrition. Your body requires vitamins, minerals, and protein. Vegetables are the best source of vitamins and minerals. Vegetables also provide the perfect balance of protein. Processed food has little nutritional value, so try to avoid this.  Medications  Resume taking all of your medications. If your incision is causing pain, you may take over-the counter pain relievers such as acetaminophen (Tylenol). If you were prescribed a stronger pain medication, please be aware these medications can cause nausea and constipation. Prevent  nausea by taking the medication with a snack or meal. Avoid constipation by drinking plenty of fluids and eating foods with high amount of fiber, such as fruits, vegetables, and grains.  Do not take Tylenol if you are taking prescription pain medications.  Follow up Your surgeon may want to see you in the office following your access surgery. If so, this will be arranged at the time of your surgery.  Please call us immediately for any of the following conditions:  Increased pain, redness, drainage (pus) from your incision site Fever of 101 degrees or higher Severe or worsening pain at your incision site Hand pain or numbness.  Reduce your risk of vascular disease:  Stop smoking. If you would like help, call [facility] at 1-800-QUIT-NOW (317-456-8522) or [place] at 412-773-8033  Manage your cholesterol Maintain a desired weight Control your diabetes Keep your blood pressure down  Dialysis  It will take several weeks to several months for your new dialysis access to be ready for use. Your surgeon will determine when it is okay to use it. Your nephrologist will continue to direct your dialysis. You can continue to use your Permcath until your new access is ready for use.   04/24/2023 Caitlin Symes [id number] 19-May-1976  Surgeon(s): Leonie Douglas, MD  Procedure(s): INSERTION OF LEFT ARM ARTERIOVENOUS (AV) GOR-TEX GRAFT  x Do not stick graft for 4 weeks    If you have any questions, please call the office at [phone].

## 2023-04-24 NOTE — Interval H&P Note (Signed)
History and Physical Interval Note:  04/24/2023 12:30 PM  George Day  has presented today for surgery, with the diagnosis of ESRD.  The various methods of treatment have been discussed with the patient and family. After consideration of risks, benefits and other options for treatment, the patient has consented to  Procedure(s): INSERTION OF LEFT ARM ARTERIOVENOUS (AV) GOR-TEX GRAFT (Left) as a surgical intervention.  The patient's history has been reviewed, patient examined, no change in status, stable for surgery.  I have reviewed the patient's chart and labs.  Questions were answered to the patient's satisfaction.     Leonie Douglas

## 2023-04-24 NOTE — Telephone Encounter (Signed)
Reached out to provider for clarification.

## 2023-04-24 NOTE — Transfer of Care (Signed)
Immediate Anesthesia Transfer of Care Note  Patient: George Day  Procedure(s) Performed: INSERTION OF LEFT ARM ARTERIOVENOUS (AV) GOR-TEX GRAFT (Left: Arm Upper)  Patient Location: PACU  Anesthesia Type:MAC  Level of Consciousness: awake and alert   Airway & Oxygen Therapy: Patient Spontanous Breathing and Patient connected to face mask oxygen  Post-op Assessment: Report given to RN, Post -op Vital signs reviewed and stable, and Patient moving all extremities X 4  Post vital signs: Reviewed and stable  Last Vitals:  Vitals Value Taken Time  BP 168/83 04/24/23 1440  Temp    Pulse 45 04/24/23 1440  Resp 19 04/24/23 1442  SpO2 100 % 04/24/23 1440  Vitals shown include unfiled device data.  Last Pain:  Vitals:   04/24/23 1250  TempSrc:   PainSc: 0-No pain      Patients Stated Pain Goal: 0 (04/24/23 1030)  Complications: No notable events documented.

## 2023-04-24 NOTE — Telephone Encounter (Signed)
-----   Message from Leonie Douglas sent at 04/24/2023  2:40 PM EDT ----- [name] 04/24/2023 Procedure: Creation of left arteriovenous graft Assistant: Haskell Riling Follow up: 4 weeks with PA Studies for follow up: AVF duplex  Thank you! Elijah Birk

## 2023-04-26 ENCOUNTER — Encounter (HOSPITAL_COMMUNITY): Payer: Self-pay | Admitting: Vascular Surgery

## 2023-04-26 DIAGNOSIS — N2581 Secondary hyperparathyroidism of renal origin: Secondary | ICD-10-CM | POA: Diagnosis not present

## 2023-04-26 DIAGNOSIS — Z992 Dependence on renal dialysis: Secondary | ICD-10-CM | POA: Diagnosis not present

## 2023-04-26 DIAGNOSIS — N186 End stage renal disease: Secondary | ICD-10-CM | POA: Diagnosis not present

## 2023-04-26 DIAGNOSIS — E8770 Fluid overload, unspecified: Secondary | ICD-10-CM | POA: Diagnosis not present

## 2023-04-29 DIAGNOSIS — Z992 Dependence on renal dialysis: Secondary | ICD-10-CM | POA: Diagnosis not present

## 2023-04-29 DIAGNOSIS — Z4931 Encounter for adequacy testing for hemodialysis: Secondary | ICD-10-CM | POA: Diagnosis not present

## 2023-04-29 DIAGNOSIS — N186 End stage renal disease: Secondary | ICD-10-CM | POA: Diagnosis not present

## 2023-04-29 DIAGNOSIS — E8770 Fluid overload, unspecified: Secondary | ICD-10-CM | POA: Diagnosis not present

## 2023-04-29 DIAGNOSIS — N2581 Secondary hyperparathyroidism of renal origin: Secondary | ICD-10-CM | POA: Diagnosis not present

## 2023-04-29 DIAGNOSIS — D509 Iron deficiency anemia, unspecified: Secondary | ICD-10-CM | POA: Diagnosis not present

## 2023-04-30 DIAGNOSIS — D509 Iron deficiency anemia, unspecified: Secondary | ICD-10-CM | POA: Diagnosis not present

## 2023-04-30 DIAGNOSIS — N186 End stage renal disease: Secondary | ICD-10-CM | POA: Diagnosis not present

## 2023-04-30 DIAGNOSIS — Z4931 Encounter for adequacy testing for hemodialysis: Secondary | ICD-10-CM | POA: Diagnosis not present

## 2023-04-30 DIAGNOSIS — N2581 Secondary hyperparathyroidism of renal origin: Secondary | ICD-10-CM | POA: Diagnosis not present

## 2023-04-30 DIAGNOSIS — E8770 Fluid overload, unspecified: Secondary | ICD-10-CM | POA: Diagnosis not present

## 2023-04-30 DIAGNOSIS — Z992 Dependence on renal dialysis: Secondary | ICD-10-CM | POA: Diagnosis not present

## 2023-05-02 DIAGNOSIS — Z992 Dependence on renal dialysis: Secondary | ICD-10-CM | POA: Diagnosis not present

## 2023-05-02 DIAGNOSIS — D509 Iron deficiency anemia, unspecified: Secondary | ICD-10-CM | POA: Diagnosis not present

## 2023-05-02 DIAGNOSIS — E8770 Fluid overload, unspecified: Secondary | ICD-10-CM | POA: Diagnosis not present

## 2023-05-02 DIAGNOSIS — Z4931 Encounter for adequacy testing for hemodialysis: Secondary | ICD-10-CM | POA: Diagnosis not present

## 2023-05-02 DIAGNOSIS — N2581 Secondary hyperparathyroidism of renal origin: Secondary | ICD-10-CM | POA: Diagnosis not present

## 2023-05-02 DIAGNOSIS — N186 End stage renal disease: Secondary | ICD-10-CM | POA: Diagnosis not present

## 2023-05-03 DIAGNOSIS — Z4931 Encounter for adequacy testing for hemodialysis: Secondary | ICD-10-CM | POA: Diagnosis not present

## 2023-05-03 DIAGNOSIS — N186 End stage renal disease: Secondary | ICD-10-CM | POA: Diagnosis not present

## 2023-05-03 DIAGNOSIS — Z992 Dependence on renal dialysis: Secondary | ICD-10-CM | POA: Diagnosis not present

## 2023-05-03 DIAGNOSIS — D509 Iron deficiency anemia, unspecified: Secondary | ICD-10-CM | POA: Diagnosis not present

## 2023-05-03 DIAGNOSIS — N2581 Secondary hyperparathyroidism of renal origin: Secondary | ICD-10-CM | POA: Diagnosis not present

## 2023-05-03 DIAGNOSIS — E8770 Fluid overload, unspecified: Secondary | ICD-10-CM | POA: Diagnosis not present

## 2023-05-07 NOTE — Progress Notes (Unsigned)
  POST OPERATIVE OFFICE NOTE    CC:  F/u for surgery  HPI:  This is a 47 y.o. male who is s/p LUE AVG on 04/24/2023 by Dr. Lenell Antu.  He has TDC that was placed by IR on 03/05/2023.  Pt is right hand dominant.   Pt states he does *** have pain/numbness in the *** hand.    The pt *** on dialysis *** at *** location.   Allergies  Allergen Reactions   Honey Bee Venom [Bee Venom] Anaphylaxis   Penicillins Anaphylaxis    ALL   Tomato Swelling    Current Outpatient Medications  Medication Sig Dispense Refill   amLODipine (NORVASC) 10 MG tablet Take 1 tablet (10 mg total) by mouth daily. 90 tablet 1   aspirin EC 81 MG tablet Take 1 tablet (81 mg total) by mouth daily. 90 tablet 1   atorvastatin (LIPITOR) 40 MG tablet Take 1 tablet (40 mg total) by mouth daily. 90 tablet 1   carvedilol (COREG) 25 MG tablet Take 2 tablets (50 mg total) by mouth 2 (two) times daily. 360 tablet 1   doxycycline (VIBRA-TABS) 100 MG tablet Take 1 tablet (100 mg total) by mouth 2 (two) times daily. 28 tablet 0   famotidine (PEPCID) 20 MG tablet Take 1 tablet (20 mg total) by mouth daily. 90 tablet 1   Fluticasone-Umeclidin-Vilant (TRELEGY ELLIPTA) 100-62.5-25 MCG/ACT AEPB Inhale 1 puff into the lungs daily. 60 each 5   nitroGLYCERIN (NITROSTAT) 0.6 MG SL tablet Place 1 tablet (0.6 mg total) under the tongue every 5 (five) minutes as needed for chest pain. 100 tablet 5   olmesartan (BENICAR) 20 MG tablet Take 1 tablet (20 mg total) by mouth at bedtime for high blood pressure. 30 tablet 11   oxyCODONE-acetaminophen (PERCOCET) 5-325 MG tablet Take 1 tablet by mouth every 6 (six) hours as needed. 20 tablet 0   potassium chloride SA (KLOR-CON M) 20 MEQ tablet Take 1 tablet (20 mEq total) by mouth daily.     sevelamer carbonate (RENVELA) 800 MG tablet Take 1 tablet (800 mg total) by mouth 3 (three) times daily with meals. 270 tablet 3   No current facility-administered medications for this visit.     ROS:  See  HPI  Physical Exam:  ***  Incision:  *** Extremities:   There *** a palpable *** pulse.   Motor and sensory *** in tact.   There *** a thrill/bruit present.  Access is *** easily palpable    Assessment/Plan:  This is a 47 y.o. male who is s/p: LUE AVG on 04/24/2023 by Dr. Lenell Antu.  He has TDC that was placed by IR on 03/05/2023.  -the pt does *** have evidence of steal. -pt's access can be used ***. -if pt has tunneled catheter, this can be removed at the discretion of the dialysis center once the pt's access has been successfully cannulated to their satisfaction.  -discussed with pt that access does not last forever and will need intervention or even new access at some point.  -the pt will follow up ***   Doreatha Massed, [facility] [phone]  Clinic MD:  Randie Heinz

## 2023-05-08 ENCOUNTER — Ambulatory Visit (INDEPENDENT_AMBULATORY_CARE_PROVIDER_SITE_OTHER): Payer: 59 | Admitting: Physician Assistant

## 2023-05-08 ENCOUNTER — Encounter: Payer: Self-pay | Admitting: Physician Assistant

## 2023-05-08 VITALS — BP 169/81 | HR 68 | Temp 98.7°F | Resp 18 | Ht 69.0 in | Wt 204.0 lb

## 2023-05-08 DIAGNOSIS — Z992 Dependence on renal dialysis: Secondary | ICD-10-CM

## 2023-05-08 DIAGNOSIS — N186 End stage renal disease: Secondary | ICD-10-CM

## 2023-05-15 ENCOUNTER — Ambulatory Visit: Payer: Self-pay

## 2023-05-15 NOTE — Patient Outreach (Signed)
Care Coordination   Follow Up Visit Note   05/15/2023 Name: George Day MRN: [id number] DOB: February 16, 1976  George Day is a 47 y.o. year old male who sees George Macadamia, MD for primary care. I spoke with  George Day by phone today.  What matters to the patients health and wellness today?  Patient would like to have his AV Graft function well for dialysis and have his catheter removed.     Goals Addressed             This Visit's Progress    To schedule a follow up visit with kidney specialist       Care Coordination Interventions:  Evaluation of current treatment plan related to end stage renal disease self management and patient's adherence to plan as established by provider     Reviewed and discussed with patient his recent procedure for AV Graft placement  Determined patient is doing well with this graft thus far, his incision is healing without issues Determined the new graft will be accessed on or around 05/22/23, after which and after several uneventful uses, his catheter will be removed Educated patient on how to care for the AV graft, including how check for blood flow and when to contact the doctor and or kidney center if no blood flow is noted and or other concerns arise    Interventions Today    Flowsheet Row Most Recent Value  Chronic Disease   Chronic disease during today's visit Chronic Kidney Disease/End Stage Renal Disease (ESRD), Other  [s/p AV Graft]  General Interventions   General Interventions Discussed/Reviewed General Interventions Discussed, General Interventions Reviewed  Doctor Visits Discussed/Reviewed Doctor Visits Discussed, Doctor Visits Reviewed, Specialist, PCP  Education Interventions   Education Provided Provided Education  Provided Verbal Education On When to see the doctor          SDOH assessments and interventions completed:  No     Care Coordination Interventions:  Yes, provided   Follow up plan: Follow up call  scheduled for 06/18/23 @1 :30 PM     Encounter Outcome:  Patient Visit Completed

## 2023-05-15 NOTE — Patient Instructions (Signed)
Visit Information  Thank you for taking time to visit with me today. Please don't hesitate to contact me if I can be of assistance to you.   Following are the goals we discussed today:   Goals Addressed             This Visit's Progress    To schedule a follow up visit with kidney specialist       Care Coordination Interventions:  Evaluation of current treatment plan related to end stage renal disease self management and patient's adherence to plan as established by provider     Reviewed and discussed with patient his recent procedure for AV Graft placement  Determined patient is doing well with this graft thus far, his incision is healing without issues Determined the new graft will be accessed on or around 05/22/23, after which and after several uneventful uses, his catheter will be removed Educated patient on how to care for the AV graft, including how check for blood flow and when to contact the doctor and or kidney center if no blood flow is noted and or other concerns arise        Our next appointment is by telephone on 06/18/23 at 1:30 PM   Please call the care guide team at [phone] if you need to cancel or reschedule your appointment.   If you are experiencing a Mental Health or Behavioral Health Crisis or need someone to talk to, please call 1-800-273-TALK (toll free, 24 hour hotline)  Patient verbalizes understanding of instructions and care plan provided today and agrees to view in MyChart. Active MyChart status and patient understanding of how to access instructions and care plan via MyChart confirmed with patient.     [name] RN BSN CCM [place]  [facility], [facility] Nurse Care Coordinator  Direct Dial: [phone] Website: [email or web address]

## 2023-05-20 DIAGNOSIS — N186 End stage renal disease: Secondary | ICD-10-CM | POA: Diagnosis not present

## 2023-05-20 DIAGNOSIS — Z992 Dependence on renal dialysis: Secondary | ICD-10-CM | POA: Diagnosis not present

## 2023-05-20 DIAGNOSIS — I120 Hypertensive chronic kidney disease with stage 5 chronic kidney disease or end stage renal disease: Secondary | ICD-10-CM | POA: Diagnosis not present

## 2023-05-22 ENCOUNTER — Ambulatory Visit (HOSPITAL_COMMUNITY): Payer: 59

## 2023-05-22 ENCOUNTER — Ambulatory Visit: Payer: 59 | Admitting: Nurse Practitioner

## 2023-05-23 ENCOUNTER — Ambulatory Visit: Payer: 59 | Attending: Nurse Practitioner | Admitting: Nurse Practitioner

## 2023-05-23 NOTE — Progress Notes (Deleted)
Office Visit    Patient Name: George Day Date of Encounter: 05/23/2023  Primary Care Provider:  [name], MD Primary Cardiologist:  Nanetta Batty, MD  Chief Complaint    47 year old male with a history of CAD, chronic diastolic heart failure, hypertension, hyperlipidemia, CVA,  CKD stage V on HD, type 2 diabetes, adrenal mass, anemia, OSA on CPAP, asthma, and MS who presents for follow-up related to CAD and heart failure.  Past Medical History    Past Medical History:  Diagnosis Date   (HFpEF) heart failure with preserved ejection fraction (HCC)    Anemia 1998   Anginal pain (HCC)    Anxiety    Asthma    CKD (chronic kidney disease) stage 5, GFR less than 15 ml/min (HCC)    Coronary artery disease    Diabetes mellitus without complication (HCC) 1995   Hypertension 2019   MI (myocardial infarction) (HCC) 2010   Multiple sclerosis (HCC)    Sleep apnea    Stroke (HCC) 2010   Past Surgical History:  Procedure Laterality Date   AV FISTULA PLACEMENT Left 04/24/2023   Procedure: INSERTION OF LEFT ARM ARTERIOVENOUS (AV) GOR-TEX GRAFT;  Surgeon: Leonie Douglas, MD;  Location: [facility] OR;  Service: Vascular;  Laterality: Left;  with regional block   BIOPSY  08/18/2022   Procedure: BIOPSY;  Surgeon: Imogene Burn, MD;  Location: [facility] ENDOSCOPY;  Service: Gastroenterology;;   CARDIAC CATHETERIZATION  2010   COLONOSCOPY WITH PROPOFOL N/A 08/18/2022   Procedure: COLONOSCOPY WITH PROPOFOL;  Surgeon: Imogene Burn, MD;  Location: [facility] ENDOSCOPY;  Service: Gastroenterology;  Laterality: N/A;   ESOPHAGOGASTRODUODENOSCOPY (EGD) WITH PROPOFOL N/A 08/18/2022   Procedure: ESOPHAGOGASTRODUODENOSCOPY (EGD) WITH PROPOFOL;  Surgeon: Imogene Burn, MD;  Location: [facility] ENDOSCOPY;  Service: Gastroenterology;  Laterality: N/A;   IR FLUORO GUIDE CV LINE RIGHT  03/05/2023   IR US GUIDE VASC ACCESS RIGHT  03/05/2023   POLYPECTOMY  08/18/2022   Procedure: POLYPECTOMY;  Surgeon: Imogene Burn, MD;   Location: [facility] ENDOSCOPY;  Service: Gastroenterology;;    Allergies  Allergies  Allergen Reactions   Honey Bee Venom [Bee Venom] Anaphylaxis   Penicillins Anaphylaxis    ALL   Tomato Swelling     Labs/Other Studies Reviewed    The following studies were reviewed today:  Cardiac Studies & Procedures     STRESS TESTS  MYOCARDIAL PERFUSION IMAGING 09/14/2020  Narrative  The left ventricular ejection fraction is moderately decreased (30-44%).  Nuclear stress EF: 42%.  ST segment depression was noted during stress in the II, III, aVF and V6 leads, and returning to baseline after 1-5 minutes of recovery.  No evidence of ischemia or infarct on nuclear perfusion study.  This is an intermediate risk study due to moderately reduced LVEF.  Laurance Flatten, MD   ECHOCARDIOGRAM  ECHOCARDIOGRAM COMPLETE 02/14/2023  Narrative ECHOCARDIOGRAM REPORT    Patient Name:   George Day Date of Exam: 02/14/2023 Medical Rec #:  [id number]      Height:       69.0 in Accession #:    [id number]     Weight:       198.0 lb Date of Birth:  [birth year]       BSA:          2.057 m Patient Age:    47 years       BP:           175/107 mmHg Patient Gender: M  HR:           69 bpm. Exam Location:  Inpatient  Procedure: 2D Echo, Cardiac Doppler and Color Doppler  Indications:    CHF-Acute Diastolic I50.31  History:        Patient has prior history of Echocardiogram examinations, most recent 09/18/2022. CHF, CAD, COPD, Signs/Symptoms:Chest Pain; Risk Factors:Hypertension, Diabetes, Dyslipidemia and Former Smoker. CKD, stage 4.  Sonographer:    Lucendia Herrlich Referring Phys: [id number] RONDELL A SMITH  IMPRESSIONS   1. Left ventricular ejection fraction, by estimation, is 50 to 55%. The left ventricle has low normal function. The left ventricle has no regional wall motion abnormalities. There is severe concentric left ventricular hypertrophy. Left ventricular diastolic  parameters are consistent with Grade III diastolic dysfunction (restrictive). 2. Right ventricular systolic function is normal. The right ventricular size is normal. Tricuspid regurgitation signal is inadequate for assessing PA pressure. 3. Left atrial size was moderately dilated. 4. A large pericardial effusion is present and measures largest around the RA best visualized on subcostal views. The pericardial effusion is circumferential. There is no evidence of cardiac tamponade. 5. The mitral valve is grossly normal. Mild mitral valve regurgitation. 6. The aortic valve is tricuspid. Aortic valve regurgitation is not visualized. Aortic valve sclerosis is present, with no evidence of aortic valve stenosis. 7. The inferior vena cava is dilated in size with <50% respiratory variability, suggesting right atrial pressure of 15 mmHg.  Comparison(s): Compared to prior TTE in 08/2022, the pericardial effusion is slightly larger around the RA but no evidence of tamponade physiology.  FINDINGS Left Ventricle: Left ventricular ejection fraction, by estimation, is 50 to 55%. The left ventricle has low normal function. The left ventricle has no regional wall motion abnormalities. The left ventricular internal cavity size was normal in size. There is severe concentric left ventricular hypertrophy. Left ventricular diastolic parameters are consistent with Grade III diastolic dysfunction (restrictive).  Right Ventricle: The right ventricular size is normal. No increase in right ventricular wall thickness. Right ventricular systolic function is normal. Tricuspid regurgitation signal is inadequate for assessing PA pressure.  Left Atrium: Left atrial size was moderately dilated.  Right Atrium: Right atrial size was normal in size.  Pericardium: A large pericardial effusion is present and measures largest around the RA best visualized on subcostal views. The pericardial effusion is circumferential. There is no  evidence of cardiac tamponade.  Mitral Valve: The mitral valve is grossly normal. There is mild thickening of the mitral valve leaflet(s). Mild mitral valve regurgitation.  Tricuspid Valve: The tricuspid valve is normal in structure. Tricuspid valve regurgitation is trivial.  Aortic Valve: The aortic valve is tricuspid. Aortic valve regurgitation is not visualized. Aortic valve sclerosis is present, with no evidence of aortic valve stenosis. Aortic valve peak gradient measures 10.2 mmHg.  Pulmonic Valve: The pulmonic valve was normal in structure. Pulmonic valve regurgitation is trivial.  Aorta: The aortic root is normal in size and structure.  Venous: The inferior vena cava is dilated in size with less than 50% respiratory variability, suggesting right atrial pressure of 15 mmHg.  IAS/Shunts: The atrial septum is grossly normal.   LEFT VENTRICLE PLAX 2D LVIDd:         4.70 cm   Diastology LVIDs:         3.30 cm   LV e' medial:    7.77 cm/s LV PW:         1.50 cm   LV E/e' medial:  14.8 LV IVS:  1.30 cm   LV e' lateral:   7.80 cm/s LVOT diam:     2.00 cm   LV E/e' lateral: 14.7 LV SV:         74 LV SV Index:   36 LVOT Area:     3.14 cm   RIGHT VENTRICLE             IVC RV S prime:     16.40 cm/s  IVC diam: 2.30 cm TAPSE (M-mode): 2.2 cm  LEFT ATRIUM              Index        RIGHT ATRIUM           Index LA diam:        4.70 cm  2.28 cm/m   RA Area:     17.10 cm LA Vol (A2C):   86.7 ml  42.14 ml/m  RA Volume:   41.30 ml  20.08 ml/m LA Vol (A4C):   94.0 ml  45.69 ml/m LA Biplane Vol: 100.0 ml 48.61 ml/m AORTIC VALVE                 PULMONIC VALVE AV Area (Vmax): 2.61 cm     PR End Diast Vel: 9.12 msec AV Vmax:        160.00 cm/s AV Peak Grad:   10.2 mmHg LVOT Vmax:      133.00 cm/s LVOT Vmean:     86.367 cm/s LVOT VTI:       0.237 m  AORTA Ao Root diam: 3.00 cm Ao Asc diam:  3.20 cm  MITRAL VALVE MV Area (PHT): 4.80 cm     SHUNTS MV Decel Time: 158  msec     Systemic VTI:  0.24 m MR Peak grad: 39.7 mmHg     Systemic Diam: 2.00 cm MR Vmax:      315.00 cm/s MV E velocity: 115.00 cm/s MV A velocity: 49.90 cm/s MV E/A ratio:  2.30  Laurance Flatten MD Electronically signed by Laurance Flatten MD Signature Date/Time: 02/14/2023/1:58:47 PM    Final      CARDIAC MRI  MR CARDIAC MORPHOLOGY W WO CONTRAST 01/04/2023  Narrative CLINICAL DATA:  Cardiomyopathy with LVH  EXAM: CARDIAC MRI  TECHNIQUE: The patient was scanned on a 1.5 Tesla GE magnet. A dedicated cardiac coil was used. Functional imaging was done using Fiesta sequences. 2,3, and 4 chamber views were done to assess for RWMA's. Modified Simpson's rule using a short axis stack was used to calculate an ejection fraction on a dedicated work [employer]. The patient received 10 cc of Gadavist. After 10 minutes inversion recovery sequences were used to assess for infiltration and scar tissue.  FINDINGS: Moderate pericardial effusion, primarily inferior to heart. No evidence for tamponade (no respirophasic variation of the interventricular septum on free-breathing sequences).  Normal left ventricular size with moderate concentric LV hypertrophy, global hypokinesis with EF 49%. Normal right ventricular size with EF 51%. Moderately dilated left atrium, normal right atrium. Trileaflet aortic valve, no stenosis or regurgitation. Visually, mitral regurgitation looks mild.  On delayed enhancement imaging, there was possible subtle mid-wall late gadolinium enhancement (LGE) noted in the basal to mid septum.  MEASUREMENTS: MEASUREMENTS LVEDV 209 mL  LVEDVi 99 ml/m2  LVSV 102 mL  LVEF 49%  RVEDV 174 mL  RVEDVi 83 mL/m2  RVSV 89 mL RVEF 51%  Aortic flows do not look accurate.  T1 1082, ECV 30%  IMPRESSION: 1. Normal LV  size with moderate LV hypertrophy. EF 49%, diffuse mild hypokinesis.  2.  Normal RV size and systolic function, EF  51%.  3. Subtle mid-wall LGE primarily in the basal to mid septum. This is nonspecific.  4. Borderline elevated extracellular volume percentage, suspect increased myocardial fibrosis in setting of hypertension.  5. Moderate pericardial effusion, primarily inferior to heart. No evidence for tamponade.  Dalton Mclean   Electronically Signed By: Marca Ancona M.D. On: 01/06/2023 10:51        Recent Labs: 11/22/2022: TSH 1.00 02/12/2023: B Natriuretic Peptide 740.9 03/03/2023: ALT 22 03/07/2023: Magnesium 2.2 04/12/2023: Platelets 171 04/24/2023: BUN 44; Creatinine, Ser 7.30; Hemoglobin 14.6; Potassium 3.6; Sodium 136  Recent Lipid Panel    Component Value Date/Time   CHOL 134 11/22/2022 0811   CHOL 181 01/05/2020 1515   TRIG 138 11/22/2022 0811   HDL 38 (L) 11/22/2022 0811   HDL 41 01/05/2020 1515   CHOLHDL 3.5 11/22/2022 0811   VLDL 22 08/09/2020 0242   LDLCALC 74 11/22/2022 0811    History of Present Illness    47 year old male with the above past medical history including CAD, chronic diastolic heart failure, hypertension, hyperlipidemia, CVA,  CKD stage V on HD, type 2 diabetes, adrenal mass, anemia, OSA on CPAP, asthma, and MS.  She has a history of prior blood work today, managed medically.  He has a history of CVA in 2010.  Was hospitalized in December 2021 in the setting of chest pain.  Troponin was flat not consistent with ACS.  Echocardiogram showed EF 50 to 55%, no RWMA, G2 DD.  Blood pressure was markedly elevated during admission.  Renal artery duplex was normal.  He was started on antihypertensive regimen at discharge home.  Myoview in 08/2020 showed EF 42%, ST depression noted in lead II, III, aVF, and V6, no evidence of ischemia or infarction, intermediate risk.  Echocardiogram in 07/2020 showed normal EF, elected not to repeat echo.  He was hospitalized in 02/2021 in the setting of hypertensive urgency, headache, blurred vision.  Neurology was consulted.  MRI showed  true cervical spinal cord lesions suggestive of demyelinating disease.  He was started on steroid therapy with improvement in his symptoms.  He has a history of right adrenal gland mass.  He has followed with endocrinology.  Was hospitalized in 08/2022 in the setting of acute on chronic diastolic heart failure.  He had been off all of his medications prior to admission.  He was diuresed, home BP meds were restarted.  Echocardiogram showed EF 50 to 55%, concentric LVH, RV normal, moderate pericardial effusion.  He has followed in the advanced heart failure clinic as well as with general cardiology.  He was hospitalized again in 01/2023 in the setting of fluid volume overload secondary to not having taken his diuretics.  Repeat echocardiogram showed normal LV systolic function, G3 DD, moderate size circumferential pericardial effusion without evidence of tamponade.  He was diuresed and discharged home.  He was last seen in the office on 02/19/2023 form was stable from a cardiac standpoint.  He returned to the hospital on 03/03/2023 with complaints of left flank plain.  He was hospitalized in the setting of AKI on CKD.  He was started on dialysis.  He underwent surgical creation of left AV graft on 04/24/2023 per vascular surgery.  He presents today for follow-up.  Since his last visit and since his most recent hospitalization  CAD: Chronic diastolic heart failure: Hypertension: Hyperlipidemia: History of CVA: CKD stage  V: Type 2 diabetes: OSA: MS:  Disposition:  Home Medications    Current Outpatient Medications  Medication Sig Dispense Refill   amLODipine (NORVASC) 10 MG tablet Take 1 tablet (10 mg total) by mouth daily. 90 tablet 1   aspirin EC 81 MG tablet Take 1 tablet (81 mg total) by mouth daily. 90 tablet 1   atorvastatin (LIPITOR) 40 MG tablet Take 1 tablet (40 mg total) by mouth daily. 90 tablet 1   carvedilol (COREG) 25 MG tablet Take 2 tablets (50 mg total) by mouth 2 (two) times daily. 360  tablet 1   doxycycline (VIBRA-TABS) 100 MG tablet Take 1 tablet (100 mg total) by mouth 2 (two) times daily. 28 tablet 0   famotidine (PEPCID) 20 MG tablet Take 1 tablet (20 mg total) by mouth daily. 90 tablet 1   Fluticasone-Umeclidin-Vilant (TRELEGY ELLIPTA) 100-62.5-25 MCG/ACT AEPB Inhale 1 puff into the lungs daily. 60 each 5   nitroGLYCERIN (NITROSTAT) 0.6 MG SL tablet Place 1 tablet (0.6 mg total) under the tongue every 5 (five) minutes as needed for chest pain. 100 tablet 5   olmesartan (BENICAR) 20 MG tablet Take 1 tablet (20 mg total) by mouth at bedtime for high blood pressure. 30 tablet 11   oxyCODONE-acetaminophen (PERCOCET) 5-325 MG tablet Take 1 tablet by mouth every 6 (six) hours as needed. 20 tablet 0   potassium chloride SA (KLOR-CON M) 20 MEQ tablet Take 1 tablet (20 mEq total) by mouth daily.     sevelamer carbonate (RENVELA) 800 MG tablet Take 1 tablet (800 mg total) by mouth 3 (three) times daily with meals. 270 tablet 3   No current facility-administered medications for this visit.     Review of Systems    ***.  All other systems reviewed and are otherwise negative except as noted above.    Physical Exam    VS:  There were no vitals taken for this visit. , BMI There is no height or weight on file to calculate BMI.     GEN: Well nourished, well developed, in no acute distress. HEENT: normal. Neck: Supple, no JVD, carotid bruits, or masses. Cardiac: RRR, no murmurs, rubs, or gallops. No clubbing, cyanosis, edema.  Radials/DP/PT 2+ and equal bilaterally.  Respiratory:  Respirations regular and unlabored, clear to auscultation bilaterally. GI: Soft, nontender, nondistended, BS + x 4. MS: no deformity or atrophy. Skin: warm and dry, no rash. Neuro:  Strength and sensation are intact. Psych: Normal affect.  Accessory Clinical Findings    ECG personally reviewed by me today -    - no acute changes.   Lab Results  Component Value Date   WBC 5.3 04/12/2023   HGB  14.6 04/24/2023   HCT 43.0 04/24/2023   MCV 71.6 (L) 04/12/2023   PLT 171 04/12/2023   Lab Results  Component Value Date   CREATININE 7.30 (H) 04/24/2023   BUN 44 (H) 04/24/2023   NA 136 04/24/2023   K 3.6 04/24/2023   CL 95 (L) 04/24/2023   CO2 24 04/12/2023   Lab Results  Component Value Date   ALT 22 03/03/2023   AST 23 03/03/2023   ALKPHOS 64 03/03/2023   BILITOT 0.9 03/03/2023   Lab Results  Component Value Date   CHOL 134 11/22/2022   HDL 38 (L) 11/22/2022   LDLCALC 74 11/22/2022   TRIG 138 11/22/2022   CHOLHDL 3.5 11/22/2022    Lab Results  Component Value Date   HGBA1C 6.0 (H) 11/22/2022  Assessment & Plan    1.  ***  No BP recorded.  {Refresh Note OR Click here to enter BP  :1}***   Joylene Grapes, NP 05/23/2023, 6:42 AM

## 2023-05-29 ENCOUNTER — Other Ambulatory Visit (HOSPITAL_COMMUNITY): Payer: Self-pay

## 2023-06-06 ENCOUNTER — Other Ambulatory Visit: Payer: Self-pay | Admitting: Family

## 2023-06-06 DIAGNOSIS — I25118 Atherosclerotic heart disease of native coronary artery with other forms of angina pectoris: Secondary | ICD-10-CM

## 2023-06-06 DIAGNOSIS — I129 Hypertensive chronic kidney disease with stage 1 through stage 4 chronic kidney disease, or unspecified chronic kidney disease: Secondary | ICD-10-CM

## 2023-06-06 DIAGNOSIS — Z87891 Personal history of nicotine dependence: Secondary | ICD-10-CM

## 2023-06-06 DIAGNOSIS — I5032 Chronic diastolic (congestive) heart failure: Secondary | ICD-10-CM

## 2023-06-13 ENCOUNTER — Encounter: Payer: Self-pay | Admitting: Neurology

## 2023-06-13 ENCOUNTER — Ambulatory Visit (INDEPENDENT_AMBULATORY_CARE_PROVIDER_SITE_OTHER): Payer: 59 | Admitting: Neurology

## 2023-06-13 VITALS — BP 167/95 | HR 68 | Ht 69.0 in | Wt 207.5 lb

## 2023-06-13 DIAGNOSIS — R269 Unspecified abnormalities of gait and mobility: Secondary | ICD-10-CM | POA: Diagnosis not present

## 2023-06-13 DIAGNOSIS — G35 Multiple sclerosis: Secondary | ICD-10-CM | POA: Diagnosis not present

## 2023-06-13 NOTE — Progress Notes (Signed)
Chief Complaint  Patient presents with   New Patient (Initial Visit)    Rm 14. Alone. NP internal referral for Chronic focal neurological deficit, hx of possible demyelination disease (2022). He reports feeling "out of it", has sharp pains in back and side, blurry vision, numbness and tingling in hands and feet.      ASSESSMENT AND PLAN  George Day is a 47 y.o. male   Slow worsening gait abnormality, End-stage renal disease, on hemodialysis Monday Wednesday Friday  Abnormal MRI of brain, cervical and thoracic spine, suggestive of multiple sclerosis,  Ordered MRI of brain, cervical, and thoracic spine with without contrast, to better evaluate the lesion, help decision plan, will coordinate hemodialysis right after MRIs, gadolinium contrast administration  Laboratory evaluations Physical therapy   DIAGNOSTIC DATA (LABS, IMAGING, TESTING) - I reviewed patient records, labs, notes, testing and imaging myself where available.   MEDICAL HISTORY:  George Day 47 year old male, seen in request by primary care Dr. Heide Spark, Christy Sartorius, for evaluation of gait abnormality, abnormal MRIs, initial evaluation June 13, 2023   History is obtained from the patient and review of electronic medical records. I personally reviewed pertinent available imaging films in PACS.   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  MRI of thoracic spine July 2022, T1 signal abnormality, 2 smaller T2/STIR hyperintensity lesion involving T4-T5, also T9 He denies bowel and bladder incontinence,  PHYSICAL EXAM:   Vitals:   06/13/23 0928  BP: (!) 167/95  Pulse: 68  Weight: 207 lb 8 oz (94.1 kg)  Height: 5\' 9"  (1.753 m)   Not recorded     Body mass index is 30.64 kg/m.  PHYSICAL EXAMNIATION:  Gen: NAD, conversant, well nourised, well groomed                     Cardiovascular: Regular rate rhythm, no peripheral edema, warm, nontender. Eyes: Conjunctivae clear without exudates or hemorrhage Neck: Supple, no carotid bruits. Pulmonary: Clear to auscultation bilaterally   NEUROLOGICAL EXAM:  MENTAL STATUS: Speech/cognition: Awake, alert, oriented to history taking and casual conversation CRANIAL NERVES: CN II: Visual fields are full to confrontation. Pupils are round equal and briskly reactive to light. CN III, IV, VI: extraocular movement are normal. No ptosis. CN V: Facial sensation is intact to light touch CN VII: Face is symmetric with normal eye closure  CN VIII: Hearing is normal to causal conversation. CN IX, X: Phonation is normal. CN XI: Head turning and shoulder shrug are intact  MOTOR: Mild to moderate right hip flexion, ankle dorsiflexion weakness  REFLEXES: Reflexes are 2+ and symmetric at the biceps, 3/3 triceps, knees, and ankles. Plantar responses are extensor bilaterally  SENSORY: Intact to light touch, pinprick and vibratory sensation are intact in fingers and toes.  COORDINATION: There is no trunk or limb  dysmetria noted.  GAIT/STANCE: Push-up to get up from seated position, unsteady dragging right leg  REVIEW OF SYSTEMS:  Full 14 system review of systems performed and notable only for as above All other review of systems were negative.   ALLERGIES: Allergies  Allergen Reactions   Honey Bee Venom [Bee Venom] Anaphylaxis   Penicillins Anaphylaxis    ALL   Tomato Swelling    HOME MEDICATIONS: Current Outpatient Medications  Medication Sig Dispense Refill   amLODipine (NORVASC) 10 MG tablet Take 1 tablet (10 mg total) by mouth daily. 90 tablet 1   aspirin EC 81 MG tablet Take 1 tablet (81 mg total) by mouth daily. 90 tablet 1   atorvastatin (LIPITOR) 40 MG tablet Take 1 tablet (40 mg total) by mouth daily. 90 tablet 1   carvedilol (COREG) 25 MG tablet Take 2 tablets (50 mg total) by mouth 2 (two) times daily. 360 tablet 1   famotidine (PEPCID) 20 MG tablet Take 1 tablet (20 mg total) by mouth daily. 90 tablet 1   Fluticasone-Umeclidin-Vilant (TRELEGY ELLIPTA) 100-62.5-25 MCG/ACT AEPB Inhale 1 puff into the lungs daily. 60 each 5   olmesartan (BENICAR) 20 MG tablet Take 1 tablet (20 mg total) by mouth at bedtime for high blood pressure. 30 tablet 11   oxyCODONE-acetaminophen (PERCOCET) 5-325 MG tablet Take 1 tablet by mouth every 6 (six) hours as needed. 20 tablet 0   potassium chloride SA (KLOR-CON M) 20 MEQ tablet Take 1 tablet (20 mEq total) by mouth daily.     sevelamer carbonate (RENVELA) 800 MG tablet Take 1 tablet (800 mg total) by mouth 3 (three) times daily with meals. 270 tablet 3   nitroGLYCERIN (NITROSTAT) 0.6 MG SL tablet Place 1 tablet (0.6 mg total) under the tongue every 5 (five) minutes as needed for chest pain. (Patient not taking: Reported on 06/13/2023) 100 tablet 5   No current facility-administered medications for this visit.    PAST MEDICAL HISTORY: Past Medical History:  Diagnosis Date   (HFpEF) heart failure with preserved ejection fraction (HCC)    Anemia  1998   Anginal pain (HCC)    Anxiety    Asthma    CKD (chronic kidney disease) stage 5, GFR less than 15 ml/min (HCC)    Coronary artery disease    Diabetes mellitus without complication (HCC) 1995   Hypertension 2019   MI (myocardial infarction) (HCC) 2010   Multiple sclerosis (HCC)    Sleep apnea    Stroke (HCC) 2010    PAST SURGICAL HISTORY: Past Surgical History:  Procedure Laterality Date   AV FISTULA PLACEMENT Left 04/24/2023   Procedure: INSERTION OF LEFT ARM ARTERIOVENOUS (AV) GOR-TEX GRAFT;  Surgeon: Leonie Douglas, MD;  Location: [facility] OR;  Service: Vascular;  Laterality: Left;  with regional block   BIOPSY  08/18/2022   Procedure: BIOPSY;  Surgeon: Imogene Burn, MD;  Location: [facility] ENDOSCOPY;  Service: Gastroenterology;;   CARDIAC CATHETERIZATION  2010   COLONOSCOPY WITH PROPOFOL N/A 08/18/2022   Procedure: COLONOSCOPY WITH PROPOFOL;  Surgeon: Imogene Burn, MD;  Location[facility] ENDOSCOPY;  Service: Gastroenterology;  Laterality: N/A;   ESOPHAGOGASTRODUODENOSCOPY (EGD) WITH PROPOFOL N/A 08/18/2022   Procedure: ESOPHAGOGASTRODUODENOSCOPY (EGD) WITH PROPOFOL;  Surgeon: Norwood Levo  C, MD;  Location: [facility] ENDOSCOPY;  Service: Gastroenterology;  Laterality: N/A;   IR FLUORO GUIDE CV LINE RIGHT  03/05/2023   IR US GUIDE VASC ACCESS RIGHT  03/05/2023   POLYPECTOMY  08/18/2022   Procedure: POLYPECTOMY;  Surgeon: Imogene Burn, MD;  Location: [facility] ENDOSCOPY;  Service: Gastroenterology;;    FAMILY HISTORY: Family History  Problem Relation Age of Onset   Diabetes Mother    Hyperlipidemia Mother    Kidney disease Mother    Drug abuse Father    Diabetes Sister    Hyperlipidemia Sister    Hearing loss Sister    Diabetes Brother    Hyperlipidemia Brother     SOCIAL HISTORY: Social History   Socioeconomic History   Marital status: Married    Spouse name: Not on file   Number of children: 4   Years of education: Not on file   Highest education level: 10th grade  Occupational  History   Occupation: Hydrologist  Tobacco Use   Smoking status: Former    Current packs/day: 0.00    Average packs/day: 0.5 packs/day for 31.0 years (15.5 ttl pk-yrs)    Types: Cigars, E-cigarettes, Cigarettes    Start date: 09/1990    Quit date: 09/2021    Years since quitting: 1.7   Smokeless tobacco: Never  Vaping Use   Vaping status: Never Used  Substance and Sexual Activity   Alcohol use: Not Currently    Comment: occasioanlly    Drug use: Yes    Types: Marijuana    Comment: 2 x week   Sexual activity: Yes    Birth control/protection: None  Other Topics Concern   Not on file  Social History Narrative   Not on file   Social Determinants of Health   Financial Resource Strain: Medium Risk (09/20/2022)   Overall Financial Resource Strain (CARDIA)    Difficulty of Paying Living Expenses: Somewhat hard  Food Insecurity: No Food Insecurity (03/11/2023)   Hunger Vital Sign    Worried About Running Out of Food in the Last Year: Never true    Ran Out of Food in the Last Year: Never true  Transportation Needs: No Transportation Needs (03/03/2023)   PRAPARE - Administrator, Civil Service (Medical): No    Lack of Transportation (Non-Medical): No  Physical Activity: Not on file  Stress: Not on file  Social Connections: Not on file  Intimate Partner Violence: Not At Risk (03/03/2023)   Humiliation, Afraid, Rape, and Kick questionnaire    Fear of Current or Ex-Partner: No    Emotionally Abused: No    Physically Abused: No    Sexually Abused: No      Levert Feinstein, M.D. Ph.D.  [facility] [address] [place], [place] [address] Ph: [phone] Fax: [phone]  CC:  Earl Lagos, MD [address] [place],  [place] [address]  [name], MD

## 2023-06-17 NOTE — Therapy (Signed)
OUTPATIENT PHYSICAL THERAPY NEURO EVALUATION   Patient Name: George Day MRN: [id number] DOB:Aug 26, 1975, 47 y.o., male Today's Date: 06/18/2023   PCP: Dr. [name] REFERRING PROVIDER: Levert Feinstein, MD  END OF SESSION:  PT End of Session - 06/18/23 1626     Visit Number 1    Number of Visits 13    Date for PT Re-Evaluation 08/13/23    Authorization Type [employer] Medicaid    PT Start Time 1535    PT Stop Time 1620    PT Time Calculation (min) 45 min    Equipment Utilized During Treatment Gait belt    Activity Tolerance Patient tolerated treatment well    Behavior During Therapy WFL for tasks assessed/performed             Past Medical History:  Diagnosis Date   (HFpEF) heart failure with preserved ejection fraction (HCC)    Anemia 1998   Anginal pain (HCC)    Anxiety    Asthma    CKD (chronic kidney disease) stage 5, GFR less than 15 ml/min (HCC)    Coronary artery disease    Diabetes mellitus without complication (HCC) 1995   Hypertension 2019   MI (myocardial infarction) (HCC) 2010   Multiple sclerosis (HCC)    Sleep apnea    Stroke (HCC) 2010   Past Surgical History:  Procedure Laterality Date   AV FISTULA PLACEMENT Left 04/24/2023   Procedure: INSERTION OF LEFT ARM ARTERIOVENOUS (AV) GOR-TEX GRAFT;  Surgeon: Leonie Douglas, MD;  Location: [facility] OR;  Service: Vascular;  Laterality: Left;  with regional block   BIOPSY  08/18/2022   Procedure: BIOPSY;  Surgeon: Imogene Burn, MD;  Location: [facility] ENDOSCOPY;  Service: Gastroenterology;;   CARDIAC CATHETERIZATION  2010   COLONOSCOPY WITH PROPOFOL N/A 08/18/2022   Procedure: COLONOSCOPY WITH PROPOFOL;  Surgeon: Imogene Burn, MD;  Location: [facility] ENDOSCOPY;  Service: Gastroenterology;  Laterality: N/A;   ESOPHAGOGASTRODUODENOSCOPY (EGD) WITH PROPOFOL N/A 08/18/2022   Procedure: ESOPHAGOGASTRODUODENOSCOPY (EGD) WITH PROPOFOL;  Surgeon: Imogene Burn, MD;  Location: [facility] ENDOSCOPY;  Service: Gastroenterology;   Laterality: N/A;   IR FLUORO GUIDE CV LINE RIGHT  03/05/2023   IR US GUIDE VASC ACCESS RIGHT  03/05/2023   POLYPECTOMY  08/18/2022   Procedure: POLYPECTOMY;  Surgeon: Imogene Burn, MD;  Location: [facility] ENDOSCOPY;  Service: Gastroenterology;;   Patient Active Problem List   Diagnosis Date Noted   Gait abnormality 06/13/2023   Multiple sclerosis (HCC) 03/04/2023   (HFpEF) heart failure with preserved ejection fraction (HCC) 03/04/2023   Chronic kidney disease (CKD), stage V (HCC) 03/03/2023   Chronic diastolic (congestive) heart failure (HCC) 02/13/2023   Elevated troponin 02/13/2023   Acute respiratory distress 02/13/2023   COPD (chronic obstructive pulmonary disease) (HCC) 02/13/2023   Chronic diastolic heart failure (HCC) 10/12/2022   Chronic focal neurological deficit 10/12/2022   Hypervolemia 09/17/2022   Anasarca 09/17/2022   Hematochezia 08/18/2022   Pericardial effusion 08/17/2022   Coronary artery disease 08/17/2022   Homelessness 08/16/2022   Hypertensive urgency 02/19/2021   Acute kidney injury superimposed on chronic kidney disease (HCC)    Hypokalemia    Accelerated hypertension 02/16/2021   Right adrenal mass (HCC) 11/28/2020   Anemia 08/31/2020   Former smoker 08/29/2020   Hyperlipidemia associated with type 2 diabetes mellitus (HCC) 08/29/2020   Microcytic anemia 08/29/2020   Influenza vaccination declined 08/29/2020   23-polyvalent pneumococcal polysaccharide vaccine declined 08/29/2020   Chest pain 08/08/2020   GAD (generalized  anxiety disorder) 02/25/2020   Hyperlipidemia 02/25/2020   CKD (chronic kidney disease) stage 5, GFR less than 15 ml/min (HCC) 02/25/2020   Diabetes mellitus (HCC) 01/05/2020   Essential hypertension 01/05/2020   Tobacco dependence 01/05/2020   Coronary artery disease involving native coronary artery of native heart without angina pectoris 01/05/2020   History of CVA with residual deficit 01/05/2020   Moderate persistent asthma without  complication 01/05/2020   Class 1 obesity due to excess calories with serious comorbidity and body mass index (BMI) of 32.0 to 32.9 in adult 01/05/2020    ONSET DATE: 06/13/2023  REFERRING DIAG: G35 (ICD-10-CM) - Multiple sclerosis (HCC) R26.9 (ICD-10-CM) - Gait abnormality  THERAPY DIAG:  Other abnormalities of gait and mobility  Muscle weakness (generalized)  Rationale for Evaluation and Treatment: Rehabilitation  SUBJECTIVE:                                                                                                                                                                                             SUBJECTIVE STATEMENT: He moved from [place] to [place] in 2023, was seen by neurologist at [place] around 2022, was given the diagnosis of multiple sclerosis, planning on treatment, but because he is moving down to [place], neurological care was interrupted. He is currently on disability, End stage renal disease, hemodialysis since August 2024 at Monday Wednesday Friday at outside facility. Pt reports of frequent tripping. Pt denies any actual falls but reports of near falls daily where he catches himself on wall, furniture, rails etc. Pt accompanied by:  wife and kids  PERTINENT HISTORY: MS, end stage renal disease, heart failure,  PAIN:  Are you having pain? Yes: NPRS scale: 8/10 Pain location: bil hands and fee, lower back Pain description: neuropathic pain- pins and needles Aggravating factors: none Relieving factors: none  PRECAUTIONS: Fall  RED FLAGS: None   WEIGHT BEARING RESTRICTIONS: No  FALLS: Has patient fallen in last 6 months? No and but reports of multiple near falls  LIVING ENVIRONMENT: Lives with: lives with their spouse Lives in: House/apartment Stairs: Yes: Internal: 14 steps; on right going up and External: 4 steps; none Has following equipment at home: None  PLOF: Independent  PATIENT GOALS: Get better  OBJECTIVE:  Note:  Objective measures were completed at Evaluation unless otherwise noted.  DIAGNOSTIC FINDINGS: MRI of the brain in July 2022, moderate cerebral signal abnormality, including periventricular subcortical region, no corpus callosum involvement   MRI of cervical spine without contrast, 2 discrete cervical cord lesion, right hemicord larger on the right C5, C2.   MRI of thoracic spine July 2022,  T1 signal abnormality, 2 smaller T2/STIR hyperintensity lesion involving T4-T5, also T9 He denies bowel and bladder incontinence,    COGNITION: Overall cognitive status: Within functional limits for tasks assessed  Grip strength: 2 trial average; L UE 97.5 lbs, R UE  63 lbs  LOWER EXTREMITY ROM:     Active  Right Eval Left Eval  Hip flexion    Hip extension    Hip abduction    Hip adduction    Hip internal rotation    Hip external rotation    Knee flexion    Knee extension    Ankle dorsiflexion    Ankle plantarflexion    Ankle inversion    Ankle eversion     (Blank rows = not tested)  LOWER EXTREMITY MMT:    MMT Right Eval Left Eval  Hip flexion    Hip extension    Hip abduction    Hip adduction    Hip internal rotation    Hip external rotation    Knee flexion    Knee extension    Ankle dorsiflexion    Ankle plantarflexion    Ankle inversion    Ankle eversion    (Blank rows = not tested)  FUNCTIONAL TESTS:  5 times sit to stand: 15 sec Timed up and go (TUG): 17 sec no AD 10 meter walk test: 0.54 m/s Functional gait assessment: 13/30     TODAY'S TREATMENT:                                                                                                                              DATE:  Reviewed assessment findings with patient and discussed goals. Discussed potential need to establish OT baseline. Pt verbalized agreement. PT will reach out to referring provider for appropriateness.   Pt provided education on managing MS with activity modification, avoiding excessive  fatigue, and staying cool.  Discussed options for drop foot on R LE (foot up brace trial to be done next session due to limited time during session)   PATIENT EDUCATION: Education details: see above Person educated: Patient Education method: Explanation Education comprehension: verbalized understanding  HOME EXERCISE PROGRAM: TBD  GOALS: Goals reviewed with patient? Yes  SHORT TERM GOALS: Target date: 07/16/2023    Pt will demo 50% compliance with HEP to self manage symptoms and improve compliance Baseline: TBD Goal status: INITIAL   LONG TERM GOALS: Target date: 08/13/2023    Patient will demo <12 seconds with 5x sit to stand without UE support to improve overall functional strength in LE Baseline: 15 sec without UE support from bariatric chair (06/18/23) Goal status: INITIAL  2.  Patient will demo <12 seconds with TUG with or without AD to improve safe functional mobility and reduce fall risk. Baseline: 17 sec without AD (06/18/23) Goal status: INITIAL  3.  Patient will demo >20/30 score on functional gait assessment to improve ambulation and reduce fall risk. Baseline: 13/30 (10/29/4) Goal status:  INITIAL  4.  Patient will demo gait speed with or without AD, >0.75 m/s to improve functional ambulation and reduce fall risk. Baseline: 0.53 m/s without AD Goal status: INITIAL  5.  Patient will be able to safely navigate uneven surfaces (grass/curbs) with or without AD and SBA to improve community ambulation and reduce fall risk Baseline: not attempted Goal status: INITIAL   ASSESSMENT:  CLINICAL IMPRESSION: Patient is a 47 y.o. male who was seen today for physical therapy evaluation and treatment for gait and mobility disorder due to MS. Patient demonstrates decreased functional strength in, impaired functional ambulation, and increased fall risk based on functional tests performed today. Patient will benefit from skilled PT to address these impairments, improve  function and reduce fall risk.   OBJECTIVE IMPAIRMENTS: Abnormal gait, decreased activity tolerance, decreased balance, decreased endurance, decreased mobility, difficulty walking, decreased strength, and pain.   ACTIVITY LIMITATIONS: standing, squatting, stairs, transfers, and dressing  PARTICIPATION LIMITATIONS: meal prep, cleaning, driving, shopping, community activity, and yard work  PERSONAL FACTORS: Age and Time since onset of injury/illness/exacerbation are also affecting patient's functional outcome.   REHAB POTENTIAL: Good  CLINICAL DECISION MAKING: Stable/uncomplicated  EVALUATION COMPLEXITY: Low  PLAN:  PT FREQUENCY: 2x/week  PT DURATION: 8 weeks  PLANNED INTERVENTIONS: 97164- PT Re-evaluation, 97110-Therapeutic exercises, 97530- Therapeutic activity, [id number]- Neuromuscular re-education, 97535- Self Care, 54098- Manual therapy, [id number]- Gait training, [phone]- Orthotic Fit/training, [phone]- Aquatic Therapy, 97014- Electrical stimulation (unattended), [phone]- Electrical stimulation (manual), Patient/Family education, Balance training, Stair training, Joint mobilization, DME instructions, Cryotherapy, and Moist heat  PLAN FOR NEXT SESSION: Trial foot up brace on R LE, assess gait speed with and without foot up brace; trial st. Point cane to see if the gait speed improves. Establish HEP   Ileana Ladd, PT 06/18/2023, 4:39 PM

## 2023-06-18 ENCOUNTER — Other Ambulatory Visit: Payer: Self-pay

## 2023-06-18 ENCOUNTER — Ambulatory Visit: Payer: Self-pay

## 2023-06-18 ENCOUNTER — Ambulatory Visit: Payer: 59 | Attending: Neurology

## 2023-06-18 DIAGNOSIS — R2689 Other abnormalities of gait and mobility: Secondary | ICD-10-CM | POA: Insufficient documentation

## 2023-06-18 DIAGNOSIS — R269 Unspecified abnormalities of gait and mobility: Secondary | ICD-10-CM | POA: Insufficient documentation

## 2023-06-18 DIAGNOSIS — G35 Multiple sclerosis: Secondary | ICD-10-CM | POA: Diagnosis not present

## 2023-06-18 DIAGNOSIS — M6281 Muscle weakness (generalized): Secondary | ICD-10-CM | POA: Insufficient documentation

## 2023-06-18 NOTE — Addendum Note (Signed)
Addended by: Levert Feinstein on: 06/18/2023 05:18 PM   Modules accepted: Orders

## 2023-06-18 NOTE — Patient Instructions (Signed)
Visit Information  Thank you for taking time to visit with me today. Please don't hesitate to contact me if I can be of assistance to you.   Following are the goals we discussed today:   Goals Addressed             This Visit's Progress    To complete brain and spinal MRI       Care Coordination Interventions: Evaluation of current treatment plan related to Multiple Sclerosis  and patient's adherence to plan as established by provider Educated patient about the basic disease process related to Multiple Sclerosis, including the types of MS and Disease-Modifying drugs used to help reduce the number and severity of relapses, and can also help reduce the build-up of disability Reviewed and discussed with patient his recent follow up with Neurologist for evaluation of his MS Review of patient status, including review of consultant's reports, relevant laboratory and other test results, and medications completed Determined patient is scheduled for Neuro PT evaluation due to having impaired gait as recommended by [name], reviewed and discussed his initial evaluation is scheduled for 06/18/23 @3 :30 PM  Reviewed and discussed with patient, MD referral for brain/spine MRI, the office will call patient to schedule his appointment Discussed plans with patient for ongoing care coordination follow up and provided patient with direct contact information for nurse care coordinator      To schedule a follow up visit with kidney specialist   On track    Care Coordination Interventions:  Evaluation of current treatment plan related to end stage renal disease self management and patient's adherence to plan as established by provider     Determined patient's graft is non-functioning with decreased blood flow noted, he will require a revision of the graft   Discussed with patient he continues to receive hemodialysis via his [place] catheter with good results  Assessed for knowledge and understanding of the  transplant process and determined patient will complete his initial transplant visit at [facility] on 06/27/23 @8 :00 AM Counseled patient on the importance of adhering to his prescribed renal diet and fluid restriction in order to achieve optimal health and uneventful dialysis treatments         Our next appointment is by telephone on 07/25/23 at 09:00 AM  Please call the care guide team at [phone] if you need to cancel or reschedule your appointment.   If you are experiencing a Mental Health or Behavioral Health Crisis or need someone to talk to, please call 1-800-273-TALK (toll free, 24 hour hotline)  Patient verbalizes understanding of instructions and care plan provided today and agrees to view in MyChart. Active MyChart status and patient understanding of how to access instructions and care plan via MyChart confirmed with patient.     [name] RN BSN CCM [place]  [facility], [facility] Nurse Care Coordinator  Direct Dial: [phone] Website: [email or web address]

## 2023-06-18 NOTE — Patient Outreach (Signed)
Care Coordination   Follow Up Visit Note   06/18/2023 Name: George Day MRN: [id number] DOB: Nov 20, 1975  George Day is a 47 y.o. year old male who sees [name], MD for primary care. I spoke with  George Day by phone today.  What matters to the patients health and wellness today?  Patient would like to be evaluated for a kidney transplant. He would like to work with PT for improvement of gait and mobility.     Goals Addressed             This Visit's Progress    To complete brain and spinal MRI       Care Coordination Interventions: Evaluation of current treatment plan related to Multiple Sclerosis  and patient's adherence to plan as established by provider Educated patient about the basic disease process related to Multiple Sclerosis, including the types of MS and Disease-Modifying drugs used to help reduce the number and severity of relapses, and can also help reduce the build-up of disability Reviewed and discussed with patient his recent follow up with Neurologist for evaluation of his MS Review of patient status, including review of consultant's reports, relevant laboratory and other test results, and medications completed Determined patient is scheduled for Neuro PT evaluation due to having impaired gait as recommended by [name], reviewed and discussed his initial evaluation is scheduled for 06/18/23 @3 :30 PM  Reviewed and discussed with patient, MD referral for brain/spine MRI, the office will call patient to schedule his appointment Discussed plans with patient for ongoing care coordination follow up and provided patient with direct contact information for nurse care coordinator      To schedule a follow up visit with kidney specialist   On track    Care Coordination Interventions:  Evaluation of current treatment plan related to end stage renal disease self management and patient's adherence to plan as established by provider     Determined patient's  graft is non-functioning with decreased blood flow noted, he will require a revision of the graft   Discussed with patient he continues to receive hemodialysis via his [place] catheter with good results  Assessed for knowledge and understanding of the transplant process and determined patient will complete his initial transplant visit at [facility] on 06/27/23 @8 :00 AM Counseled patient on the importance of adhering to his prescribed renal diet and fluid restriction in order to achieve optimal health and uneventful dialysis treatments     Interventions Today    Flowsheet Row Most Recent Value  Chronic Disease   Chronic disease during today's visit Other, Chronic Kidney Disease/End Stage Renal Disease (ESRD)  [MS]  General Interventions   General Interventions Discussed/Reviewed General Interventions Discussed, General Interventions Reviewed, Doctor Visits  Doctor Visits Discussed/Reviewed Doctor Visits Discussed, Doctor Visits Reviewed, Specialist  Exercise Interventions   Exercise Discussed/Reviewed Physical Activity, Exercise Reviewed, Exercise Discussed  Physical Activity Discussed/Reviewed Physical Activity Reviewed, Physical Activity Discussed  Education Interventions   Education Provided Provided Education  Provided Verbal Education On When to see the doctor, Nutrition, Medication, Exercise  Nutrition Interventions   Nutrition Discussed/Reviewed Nutrition Reviewed, Nutrition Discussed, Fluid intake  [renal]  Pharmacy Interventions   Pharmacy Dicussed/Reviewed Pharmacy Topics Reviewed, Pharmacy Topics Discussed, Medications and their functions          SDOH assessments and interventions completed:  No     Care Coordination Interventions:  Yes, provided   Follow up plan: Follow up call scheduled for 07/25/23 @09 :00 AM  Encounter Outcome:  Patient Visit Completed

## 2023-06-19 ENCOUNTER — Telehealth: Payer: Self-pay | Admitting: Neurology

## 2023-06-19 NOTE — Telephone Encounter (Signed)
All three MRIs were sent to [facility] and they will obtain the [place]. [phone] MRI brain: [facility] auth: [id number] exp. 06/19/23-08/03/23  MRI cervical: [employer] med[employer] [facility]: [id number] exp. 06/19/23-08/03/23  MRI thoracic: [employer] [facility]: [id number] exp. 06/19/23-08/03/23

## 2023-06-20 DIAGNOSIS — E113293 Type 2 diabetes mellitus with mild nonproliferative diabetic retinopathy without macular edema, bilateral: Secondary | ICD-10-CM | POA: Diagnosis not present

## 2023-06-20 LAB — SEDIMENTATION RATE: Sed Rate: 32 mm/h — ABNORMAL HIGH (ref 0–15)

## 2023-06-20 LAB — MULTIPLE MYELOMA PANEL, SERUM
Albumin SerPl Elph-Mcnc: 3.7 g/dL (ref 2.9–4.4)
Albumin/Glob SerPl: 1 (ref 0.7–1.7)
Alpha 1: 0.2 g/dL (ref 0.0–0.4)
Alpha2 Glob SerPl Elph-Mcnc: 0.7 g/dL (ref 0.4–1.0)
B-Globulin SerPl Elph-Mcnc: 0.9 g/dL (ref 0.7–1.3)
Gamma Glob SerPl Elph-Mcnc: 2.1 g/dL — ABNORMAL HIGH (ref 0.4–1.8)
Globulin, Total: 3.9 g/dL (ref 2.2–3.9)
IgA/Immunoglobulin A, Serum: 260 mg/dL (ref 90–386)
IgG (Immunoglobin G), Serum: 2355 mg/dL — ABNORMAL HIGH (ref 603–1613)
IgM (Immunoglobulin M), Srm: 93 mg/dL (ref 20–172)
Total Protein: 7.6 g/dL (ref 6.0–8.5)

## 2023-06-20 LAB — HEPATITIS B CORE ANTIBODY, TOTAL: Hep B Core Total Ab: NEGATIVE

## 2023-06-20 LAB — HGB A1C W/O EAG: Hgb A1c MFr Bld: 6.2 % — ABNORMAL HIGH (ref 4.8–5.6)

## 2023-06-20 LAB — CK: Total CK: 236 U/L (ref 49–439)

## 2023-06-20 LAB — VITAMIN B12: Vitamin B-12: 683 pg/mL (ref 232–1245)

## 2023-06-20 LAB — STRATIFY JCV(TM) AB W/INDEX
JCV Antibody: POSITIVE — AB
JCV Index Value: 1.32

## 2023-06-20 LAB — FOLATE: Folate: 8.3 ng/mL (ref >3.0)

## 2023-06-20 LAB — VARICELLA ZOSTER ANTIBODY, IGG: Varicella zoster IgG: REACTIVE

## 2023-06-20 LAB — RPR: RPR Ser Ql: NONREACTIVE

## 2023-06-20 LAB — HIV ANTIBODY (ROUTINE TESTING W REFLEX): HIV Screen 4th Generation wRfx: NONREACTIVE

## 2023-06-20 LAB — TSH: TSH: 1.21 u[IU]/mL (ref 0.450–4.500)

## 2023-06-20 LAB — ANA W/REFLEX IF POSITIVE: Anti Nuclear Antibody (ANA): NEGATIVE

## 2023-06-20 LAB — VITAMIN D 25 HYDROXY (VIT D DEFICIENCY, FRACTURES): Vit D, 25-Hydroxy: 22.2 ng/mL — ABNORMAL LOW (ref 30.0–100.0)

## 2023-06-24 DIAGNOSIS — D631 Anemia in chronic kidney disease: Secondary | ICD-10-CM | POA: Diagnosis not present

## 2023-06-24 DIAGNOSIS — Z992 Dependence on renal dialysis: Secondary | ICD-10-CM | POA: Diagnosis not present

## 2023-06-24 DIAGNOSIS — N186 End stage renal disease: Secondary | ICD-10-CM | POA: Diagnosis not present

## 2023-06-24 DIAGNOSIS — N2581 Secondary hyperparathyroidism of renal origin: Secondary | ICD-10-CM | POA: Diagnosis not present

## 2023-06-25 ENCOUNTER — Ambulatory Visit: Payer: Managed Care, Other (non HMO) | Attending: Neurology

## 2023-06-25 ENCOUNTER — Other Ambulatory Visit: Payer: Self-pay | Admitting: Internal Medicine

## 2023-06-25 DIAGNOSIS — I5032 Chronic diastolic (congestive) heart failure: Secondary | ICD-10-CM

## 2023-06-25 DIAGNOSIS — I129 Hypertensive chronic kidney disease with stage 1 through stage 4 chronic kidney disease, or unspecified chronic kidney disease: Secondary | ICD-10-CM

## 2023-06-25 DIAGNOSIS — Z87891 Personal history of nicotine dependence: Secondary | ICD-10-CM

## 2023-06-25 DIAGNOSIS — I25118 Atherosclerotic heart disease of native coronary artery with other forms of angina pectoris: Secondary | ICD-10-CM

## 2023-06-25 IMAGING — MR MR THORACIC SPINE W/O CM
4 of 7 series · 18 of 48 positions shown · non-contrast
Comparison: Head and cervical spine MRI yesterday.

CLINICAL DATA: 45-year-old male with sudden onset bilateral blurred
vision, extremity tingling, left side numbness. Hypertensive,
systolic blood pressure 599's. Right adrenal mass. Small cervical
spinal cord lesions, the larger at C5.

EXAM:
MRI THORACIC SPINE WITHOUT CONTRAST
TECHNIQUE: Multiplanar, multisequence MR imaging of the thoracic spine was
performed. No intravenous contrast was administered.

[Series 2: T1 · sagittal · 3.0mm · 0.90mm/px · 3 of 12 slices shown (1 of 2)]
[im 1/12]
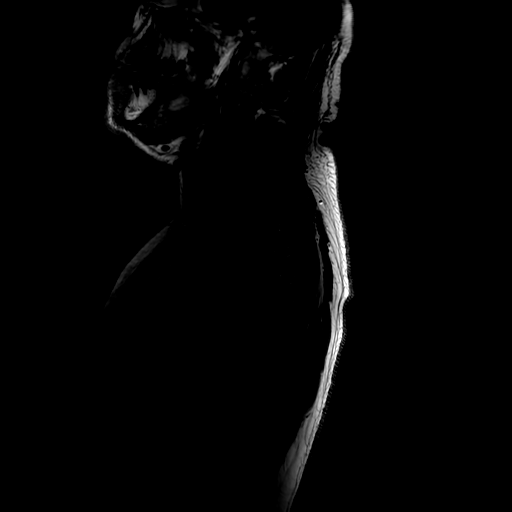
[im 6/12]
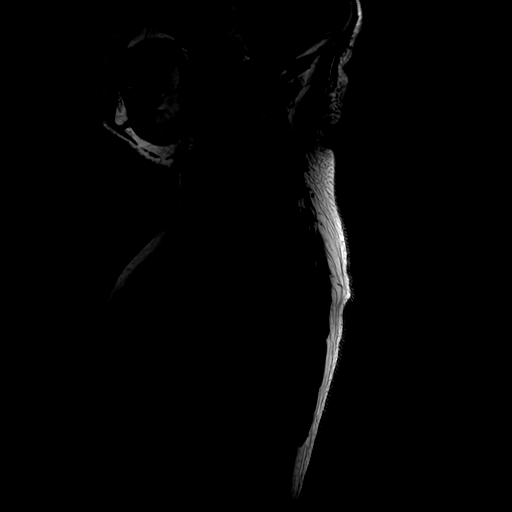
[im 12/12]
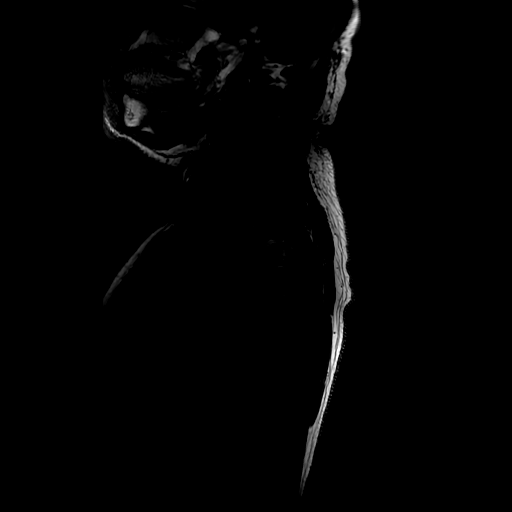

[Series 3: T2 · sagittal · 3.0mm · 0.66mm/px · 5 of 17 slices shown (1 of 2)]
[im 1/17]
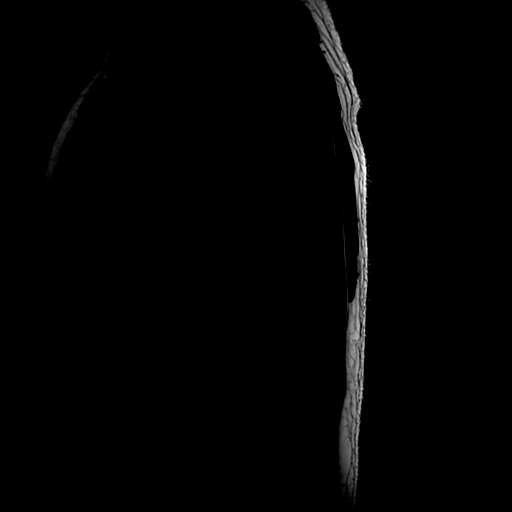
[im 5/17]
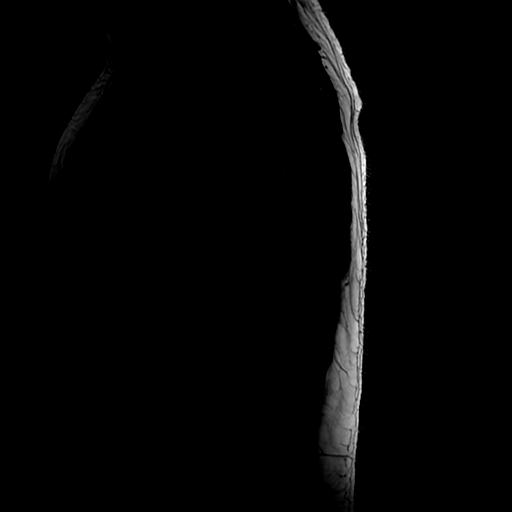
[im 9/17]
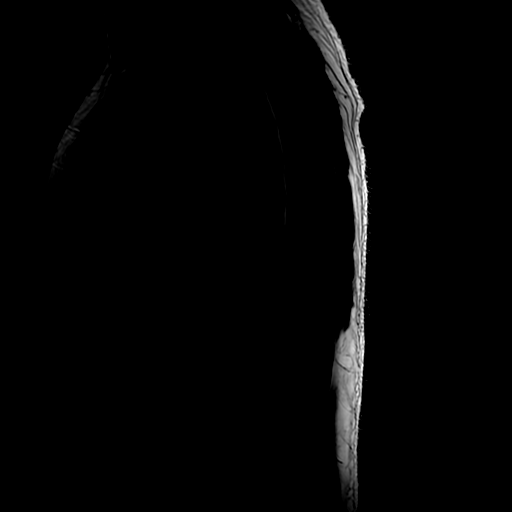
[im 13/17]
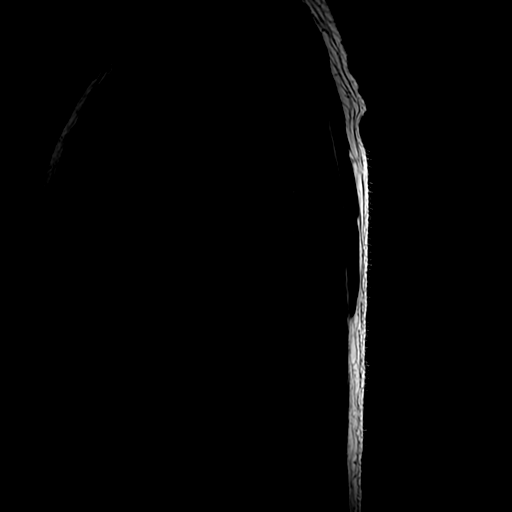
[im 17/17]
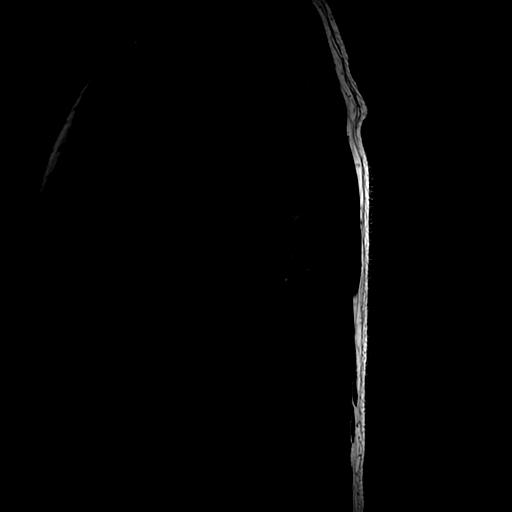

[Series 5: T1 · sagittal · 3.0mm · 0.66mm/px · 3 of 17 slices shown (2 of 2)]
[im 1/17]
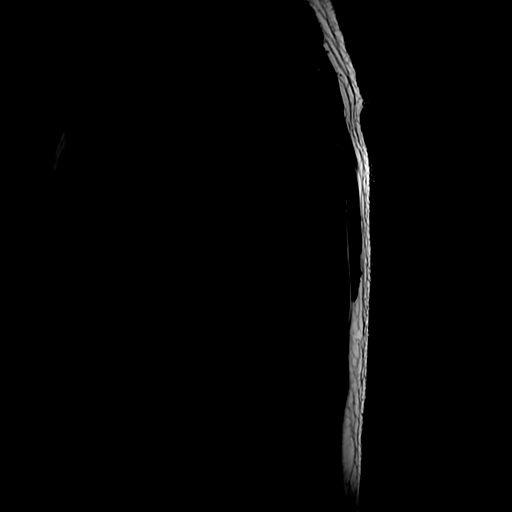
[im 9/17]
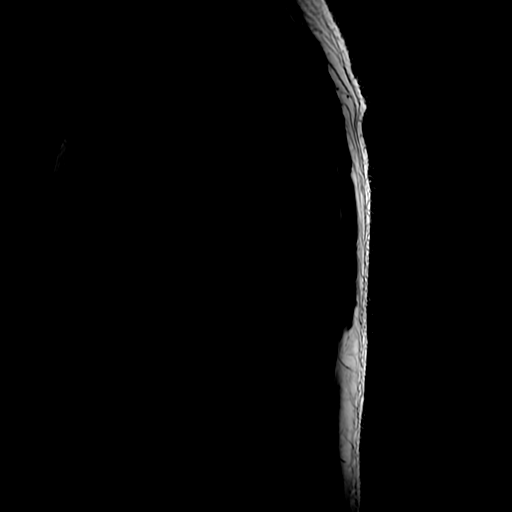
[im 17/17]
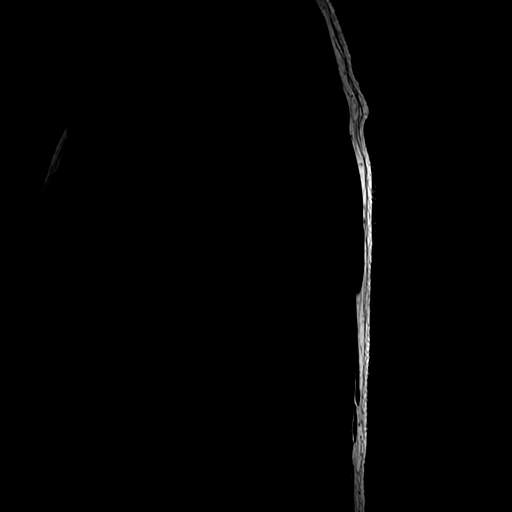

[Series 6: T2 · axial · 4.0mm · 0.39mm/px · z∈[-488,-275]mm · 7 of 39 slices shown (2 of 2)]
[im 1/39]
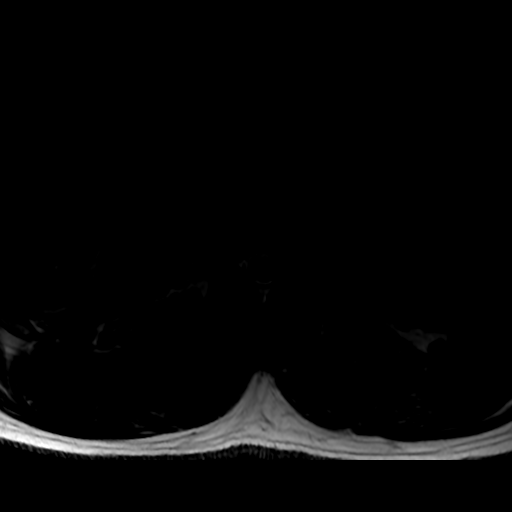
[im 5/39]
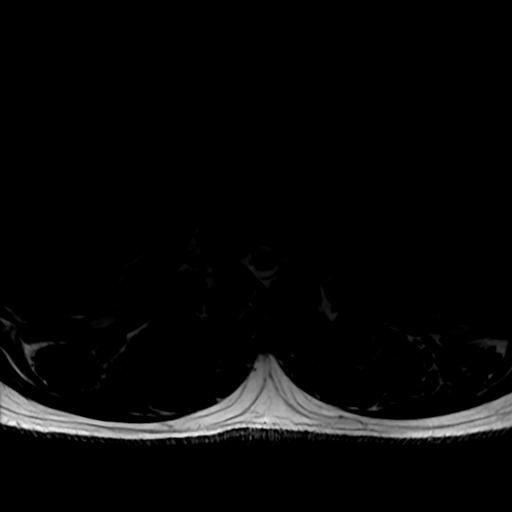
[im 13/39]
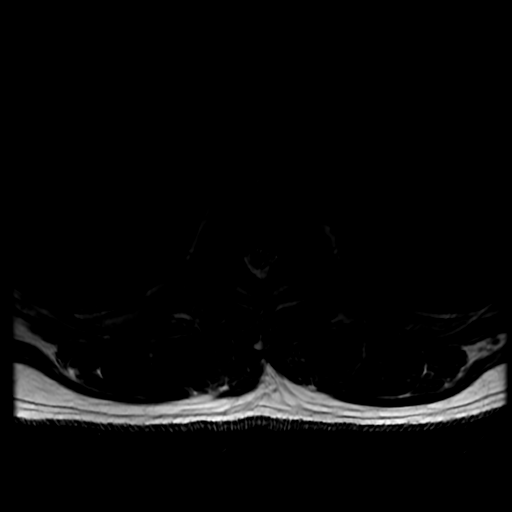
[im 17/39]
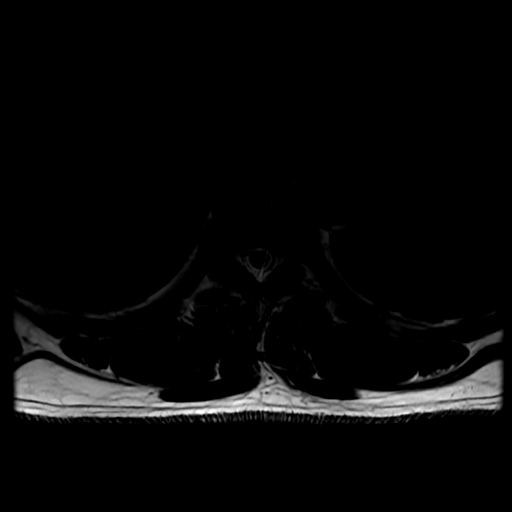
[im 22/39]
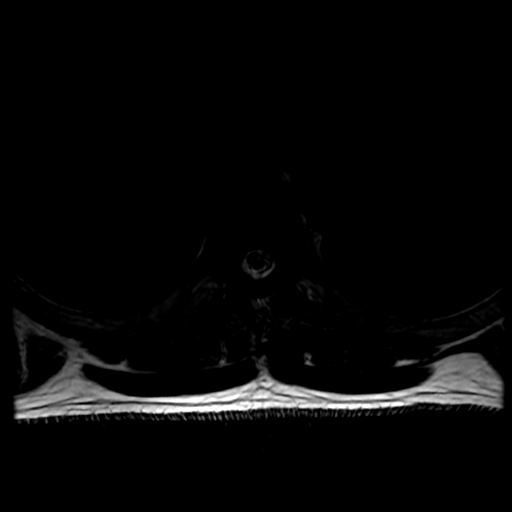
[im 26/39]
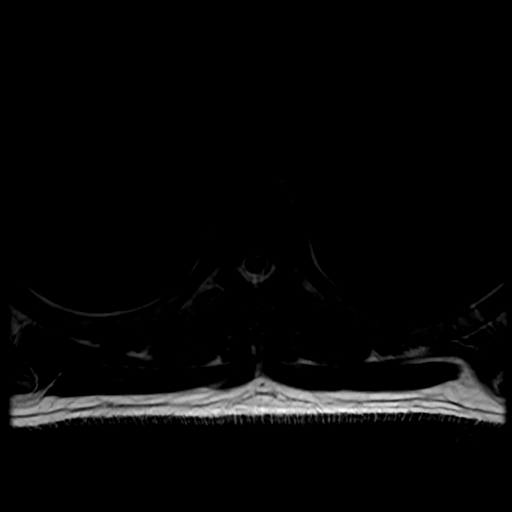
[im 34/39]
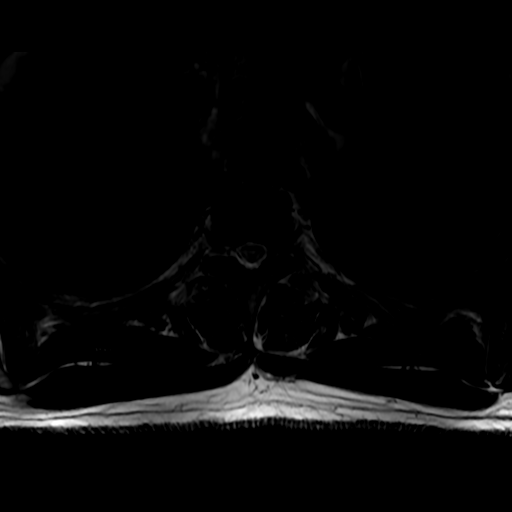

[18 of 48 positions shown; findings below may reference images not displayed]

FINDINGS: Limited cervical spine imaging:  Reported yesterday.

Thoracic spine segmentation:  Appears to be normal.

Alignment: Mildly exaggerated upper thoracic kyphosis but no
spondylolisthesis.

Vertebrae: No marrow edema or evidence of acute osseous abnormality.
Visualized bone marrow signal is within normal limits.

Cord: Abnormal. More conspicuous today on sagittal T2 imaging
(series 3, image 10) but apparent on sagittal STIR (series 4, image
10) and probably present in retrospect on series 7, image 7
yesterday is a patchy roughly 1 vertebral body length T2 and STIR
hyperintense lesion within the upper thoracic cord. Little if any
associated cord expansion.

But there appear to be at least two other small T2 and STIR
hyperintense lesions within the thoracic spine cord at T4-T5 and at
T9 (also series 4, image 10). Axial spinal cord detail is limited
due to motion. Cord volume remains normal. The conus medullaris is
below T12-L1 and not identified.

Paraspinal and other soft tissues: Known bilobed T2 hyperintense
right adrenal mass (series 6, image 37).

Visible thoracic viscera and thoracic paraspinal soft tissues appear
normal.

Disc levels:

There is perhaps mild congenital thoracic spinal canal narrowing due
to short pedicle distance. And there is a mild degree of thoracic
epidural lipomatosis. But thoracic spinal cord disc signal and
morphology remain normal at all levels. No disc herniation or
significant spinal stenosis. Occasional mild thoracic facet
hypertrophy (such as T7-T8 greater on the right series 6, image 23).
IMPRESSION: 1. Positive for a patchy, one vertebral body length T2/STIR
hyperintense upper thoracic spinal cord lesion at T1, probably
subtle but present on the Cervical STIR imaging yesterday. Little if
any associated cord expansion. And at least two other small but
similar thoracic cord lesions are identified.
The constellation of brain and spinal cord findings favors
demyelinating disease.

2. Borderline to mild generalized thoracic spinal stenosis related
to short pedicle distance and mild epidural lipomatosis. But no
thoracic disc degeneration. Intermittent mild thoracic facet
hypertrophy.

3. Known Right Adrenal Mass is partially visible, T2 hyperintense.

## 2023-06-25 NOTE — Therapy (Deleted)
OUTPATIENT PHYSICAL THERAPY NEURO TREATMENT NOTE   Patient Name: George Day MRN: [id number] DOB:[birth year], 47 y.o., male Today's Date: 06/25/2023   PCP: Dr. [name] REFERRING PROVIDER: Levert Feinstein, MD  END OF SESSION:    Past Medical History:  Diagnosis Date   (HFpEF) heart failure with preserved ejection fraction (HCC)    Anemia 1998   Anginal pain (HCC)    Anxiety    Asthma    CKD (chronic kidney disease) stage 5, GFR less than 15 ml/min (HCC)    Coronary artery disease    Diabetes mellitus without complication (HCC) 1995   Hypertension 2019   MI (myocardial infarction) (HCC) 2010   Multiple sclerosis (HCC)    Sleep apnea    Stroke (HCC) 2010   Past Surgical History:  Procedure Laterality Date   AV FISTULA PLACEMENT Left 04/24/2023   Procedure: INSERTION OF LEFT ARM ARTERIOVENOUS (AV) GOR-TEX GRAFT;  Surgeon: Leonie Douglas, MD;  Location: [facility] OR;  Service: Vascular;  Laterality: Left;  with regional block   BIOPSY  08/18/2022   Procedure: BIOPSY;  Surgeon: Imogene Burn, MD;  Location: [facility] ENDOSCOPY;  Service: Gastroenterology;;   CARDIAC CATHETERIZATION  2010   COLONOSCOPY WITH PROPOFOL N/A 08/18/2022   Procedure: COLONOSCOPY WITH PROPOFOL;  Surgeon: Imogene Burn, MD;  Location: [facility] ENDOSCOPY;  Service: Gastroenterology;  Laterality: N/A;   ESOPHAGOGASTRODUODENOSCOPY (EGD) WITH PROPOFOL N/A 08/18/2022   Procedure: ESOPHAGOGASTRODUODENOSCOPY (EGD) WITH PROPOFOL;  Surgeon: Imogene Burn, MD;  Location: [facility] ENDOSCOPY;  Service: Gastroenterology;  Laterality: N/A;   IR FLUORO GUIDE CV LINE RIGHT  03/05/2023   IR US GUIDE VASC ACCESS RIGHT  03/05/2023   POLYPECTOMY  08/18/2022   Procedure: POLYPECTOMY;  Surgeon: Imogene Burn, MD;  Location: [facility] ENDOSCOPY;  Service: Gastroenterology;;   Patient Active Problem List   Diagnosis Date Noted   Gait abnormality 06/13/2023   Multiple sclerosis (HCC) 03/04/2023   (HFpEF) heart failure with preserved ejection  fraction (HCC) 03/04/2023   Chronic kidney disease (CKD), stage V (HCC) 03/03/2023   Chronic diastolic (congestive) heart failure (HCC) 02/13/2023   Elevated troponin 02/13/2023   Acute respiratory distress 02/13/2023   COPD (chronic obstructive pulmonary disease) (HCC) 02/13/2023   Chronic diastolic heart failure (HCC) 10/12/2022   Chronic focal neurological deficit 10/12/2022   Hypervolemia 09/17/2022   Anasarca 09/17/2022   Hematochezia 08/18/2022   Pericardial effusion 08/17/2022   Coronary artery disease 08/17/2022   Homelessness 08/16/2022   Hypertensive urgency 02/19/2021   Acute kidney injury superimposed on chronic kidney disease (HCC)    Hypokalemia    Accelerated hypertension 02/16/2021   Right adrenal mass (HCC) 11/28/2020   Anemia 08/31/2020   Former smoker 08/29/2020   Hyperlipidemia associated with type 2 diabetes mellitus (HCC) 08/29/2020   Microcytic anemia 08/29/2020   Influenza vaccination declined 08/29/2020   23-polyvalent pneumococcal polysaccharide vaccine declined 08/29/2020   Chest pain 08/08/2020   GAD (generalized anxiety disorder) 02/25/2020   Hyperlipidemia 02/25/2020   CKD (chronic kidney disease) stage 5, GFR less than 15 ml/min (HCC) 02/25/2020   Diabetes mellitus (HCC) 01/05/2020   Essential hypertension 01/05/2020   Tobacco dependence 01/05/2020   Coronary artery disease involving native coronary artery of native heart without angina pectoris 01/05/2020   History of CVA with residual deficit 01/05/2020   Moderate persistent asthma without complication 01/05/2020   Class 1 obesity due to excess calories with serious comorbidity and body mass index (BMI) of 32.0 to 32.9 in adult 01/05/2020  ONSET DATE: 06/13/2023  REFERRING DIAG: G35 (ICD-10-CM) - Multiple sclerosis (HCC) R26.9 (ICD-10-CM) - Gait abnormality  THERAPY DIAG:  No diagnosis found.  Rationale for Evaluation and Treatment: Rehabilitation  SUBJECTIVE:                                                                                                                                                                                              SUBJECTIVE STATEMENT: He moved from [place] to [place] in 2023, was seen by neurologist at [facility] around 2022, was given the diagnosis of multiple sclerosis, planning on treatment, but because he is moving down to [facility], neurological care was interrupted. He is currently on disability, End stage renal disease, hemodialysis since August 2024 at Monday Wednesday Friday at outside facility. Pt reports of frequent tripping. Pt denies any actual falls but reports of near falls daily where he catches himself on wall, furniture, rails etc. Pt accompanied by:  wife and kids  PERTINENT HISTORY: MS, end stage renal disease, heart failure,  PAIN:  Are you having pain? Yes: NPRS scale: 8/10 Pain location: bil hands and fee, lower back Pain description: neuropathic pain- pins and needles Aggravating factors: none Relieving factors: none  PRECAUTIONS: Fall  RED FLAGS: None   WEIGHT BEARING RESTRICTIONS: No  FALLS: Has patient fallen in last 6 months? No and but reports of multiple near falls  LIVING ENVIRONMENT: Lives with: lives with their spouse Lives in: House/apartment Stairs: Yes: Internal: 14 steps; on right going up and External: 4 steps; none Has following equipment at home: None  PLOF: Independent  PATIENT GOALS: Get better  OBJECTIVE:  Note: Objective measures were completed at Evaluation unless otherwise noted.  DIAGNOSTIC FINDINGS: MRI of the brain in July 2022, moderate cerebral signal abnormality, including periventricular subcortical region, no corpus callosum involvement   MRI of cervical spine without contrast, 2 discrete cervical cord lesion, right hemicord larger on the right C5, C2.   MRI of thoracic spine July 2022, T1 signal abnormality, 2 smaller T2/STIR hyperintensity lesion involving  T4-T5, also T9 He denies bowel and bladder incontinence,    COGNITION: Overall cognitive status: Within functional limits for tasks assessed  Grip strength: 2 trial average; L UE 97.5 lbs, R UE  63 lbs  LOWER EXTREMITY ROM:     Active  Right Eval Left Eval  Hip flexion    Hip extension    Hip abduction    Hip adduction    Hip internal rotation    Hip external rotation    Knee flexion    Knee extension    Ankle dorsiflexion  Ankle plantarflexion    Ankle inversion    Ankle eversion     (Blank rows = not tested)  LOWER EXTREMITY MMT:    MMT Right Eval Left Eval  Hip flexion    Hip extension    Hip abduction    Hip adduction    Hip internal rotation    Hip external rotation    Knee flexion    Knee extension    Ankle dorsiflexion    Ankle plantarflexion    Ankle inversion    Ankle eversion    (Blank rows = not tested)  FUNCTIONAL TESTS:  5 times sit to stand: 15 sec Timed up and go (TUG): 17 sec no AD 10 meter walk test: 0.54 m/s Functional gait assessment: 13/30     TODAY'S TREATMENT:                                                                                                                              DATE:  Reviewed assessment findings with patient and discussed goals. Discussed potential need to establish OT baseline. Pt verbalized agreement. PT will reach out to referring provider for appropriateness.   Pt provided education on managing MS with activity modification, avoiding excessive fatigue, and staying cool.  Discussed options for drop foot on R LE (foot up brace trial to be done next session due to limited time during session)   PATIENT EDUCATION: Education details: see above Person educated: Patient Education method: Explanation Education comprehension: verbalized understanding  HOME EXERCISE PROGRAM: TBD  GOALS: Goals reviewed with patient? Yes  SHORT TERM GOALS: Target date: 07/16/2023    Pt will demo 50% compliance with  HEP to self manage symptoms and improve compliance Baseline: TBD Goal status: INITIAL   LONG TERM GOALS: Target date: 08/13/2023    Patient will demo <12 seconds with 5x sit to stand without UE support to improve overall functional strength in LE Baseline: 15 sec without UE support from bariatric chair (06/18/23) Goal status: INITIAL  2.  Patient will demo <12 seconds with TUG with or without AD to improve safe functional mobility and reduce fall risk. Baseline: 17 sec without AD (06/18/23) Goal status: INITIAL  3.  Patient will demo >20/30 score on functional gait assessment to improve ambulation and reduce fall risk. Baseline: 13/30 (10/29/4) Goal status: INITIAL  4.  Patient will demo gait speed with or without AD, >0.75 m/s to improve functional ambulation and reduce fall risk. Baseline: 0.53 m/s without AD Goal status: INITIAL  5.  Patient will be able to safely navigate uneven surfaces (grass/curbs) with or without AD and SBA to improve community ambulation and reduce fall risk Baseline: not attempted Goal status: INITIAL   ASSESSMENT:  CLINICAL IMPRESSION: Patient is a 47 y.o. male who was seen today for physical therapy evaluation and treatment for gait and mobility disorder due to MS. Patient demonstrates decreased functional strength in, impaired functional ambulation, and increased fall risk based  on functional tests performed today. Patient will benefit from skilled PT to address these impairments, improve function and reduce fall risk.   OBJECTIVE IMPAIRMENTS: Abnormal gait, decreased activity tolerance, decreased balance, decreased endurance, decreased mobility, difficulty walking, decreased strength, and pain.   ACTIVITY LIMITATIONS: standing, squatting, stairs, transfers, and dressing  PARTICIPATION LIMITATIONS: meal prep, cleaning, driving, shopping, community activity, and yard work  PERSONAL FACTORS: Age and Time since onset of injury/illness/exacerbation  are also affecting patient's functional outcome.   REHAB POTENTIAL: Good  CLINICAL DECISION MAKING: Stable/uncomplicated  EVALUATION COMPLEXITY: Low  PLAN:  PT FREQUENCY: 2x/week  PT DURATION: 8 weeks  PLANNED INTERVENTIONS: 97164- PT Re-evaluation, 97110-Therapeutic exercises, 97530- Therapeutic activity, [id number]- Neuromuscular re-education, 97535- Self Care, [id number]- Manual therapy, [id number]- Gait training, [id number]- Orthotic Fit/training, [phone]- Aquatic Therapy, 97014- Electrical stimulation (unattended), [phone]- Electrical stimulation (manual), Patient/Family education, Balance training, Stair training, Joint mobilization, DME instructions, Cryotherapy, and Moist heat  PLAN FOR NEXT SESSION: Trial foot up brace on R LE, assess gait speed with and without foot up brace; trial st. Point cane to see if the gait speed improves. Establish HEP   Ileana Ladd, PT 06/25/2023, 3:28 PM

## 2023-06-26 ENCOUNTER — Other Ambulatory Visit (HOSPITAL_COMMUNITY): Payer: Self-pay

## 2023-06-27 ENCOUNTER — Ambulatory Visit: Payer: Managed Care, Other (non HMO)

## 2023-06-28 IMAGING — US US RENAL
1 series · 14 of 25 positions shown · non-contrast
Comparison: CT scan 03/18/2021

CLINICAL DATA: Acute renal injury.

EXAM:
RENAL / URINARY TRACT ULTRASOUND COMPLETE

[Series 1: us renal · 14 of 60 slices shown]
[im 1/60]
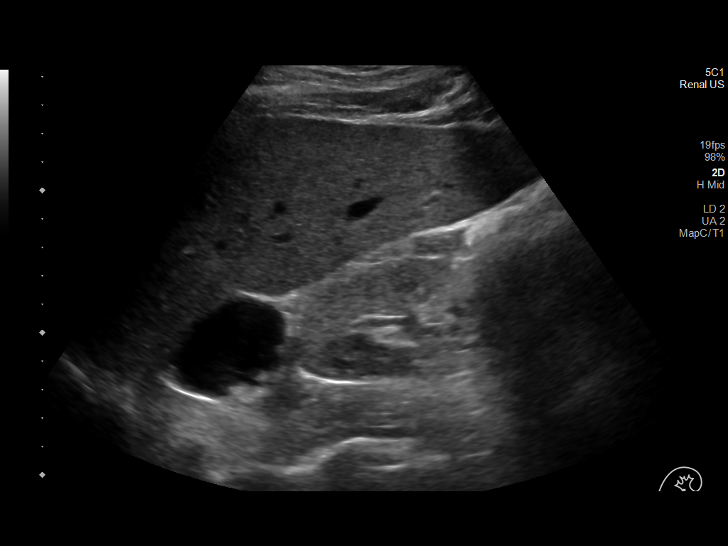
[im 5/60]
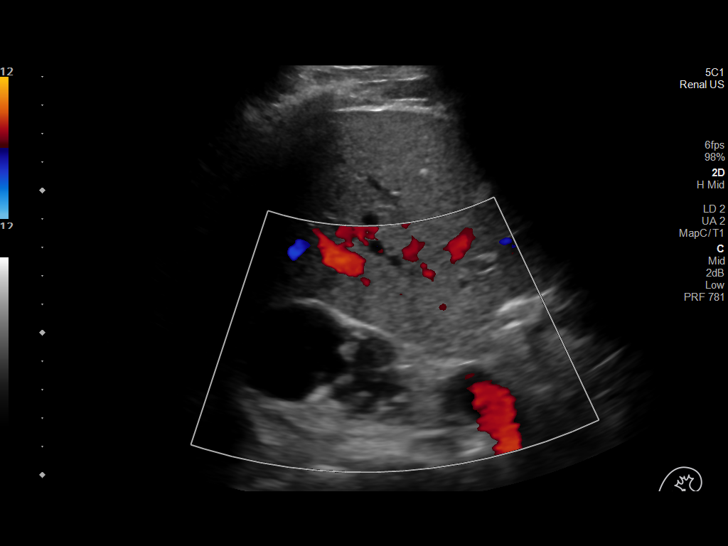
[im 10/60]
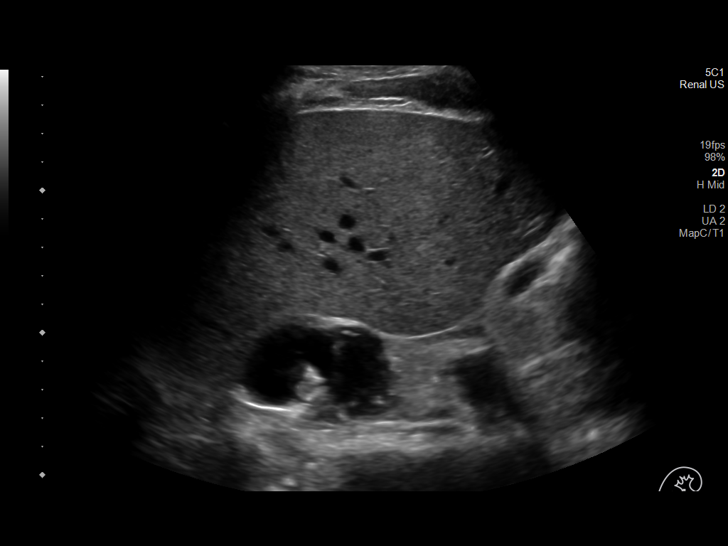
[im 15/60]
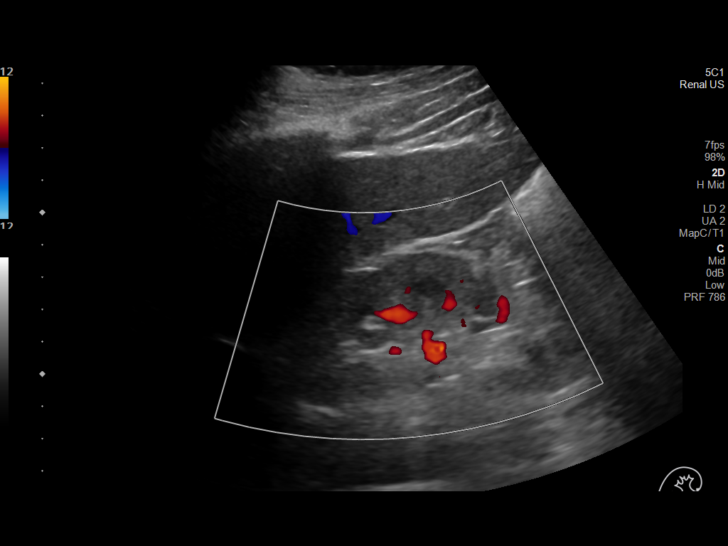
[im 20/60]
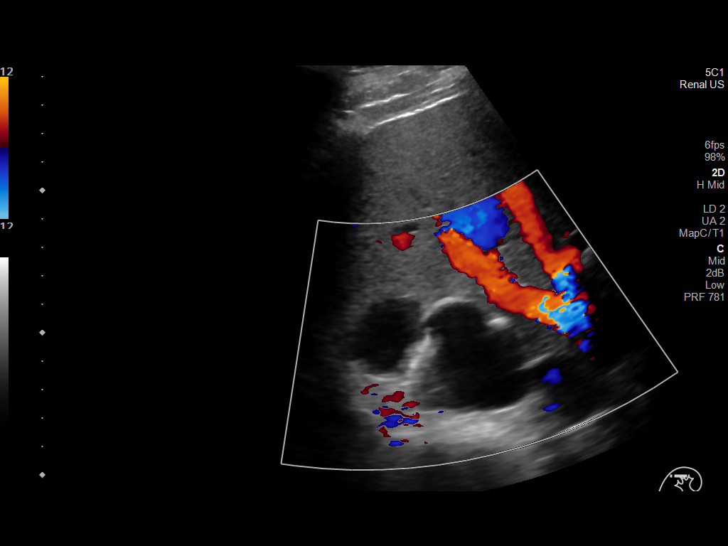
[im 23/60]
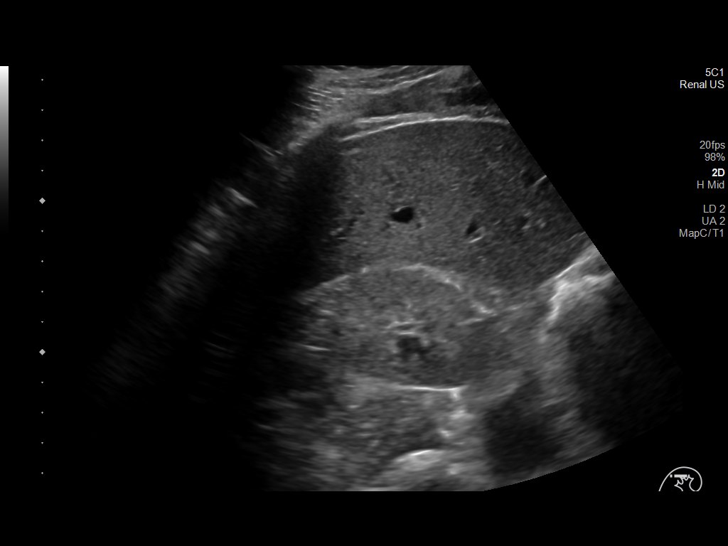
[im 28/60]
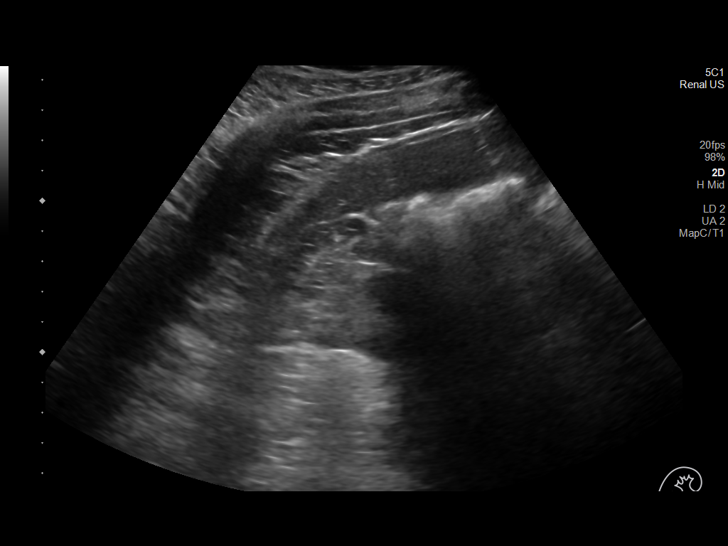
[im 32/60]
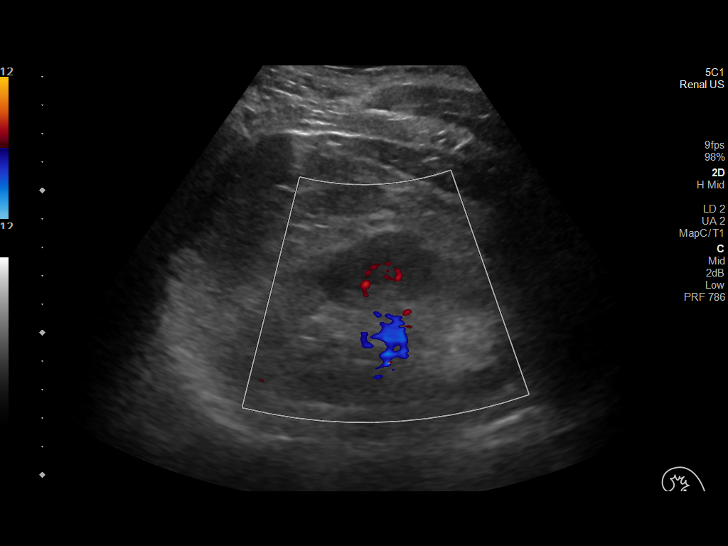
[im 37/60]
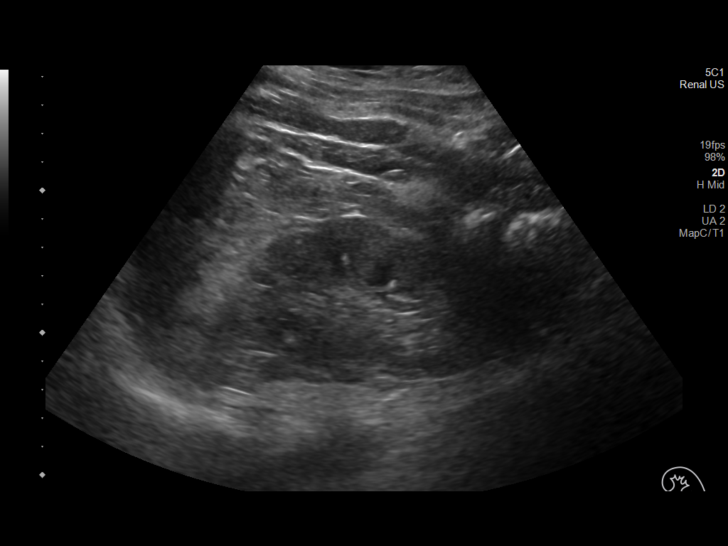
[im 40/60]
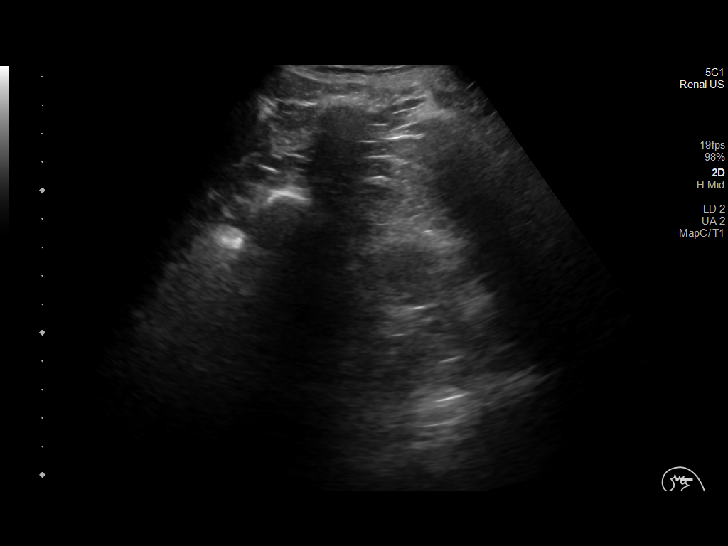
[im 45/60]
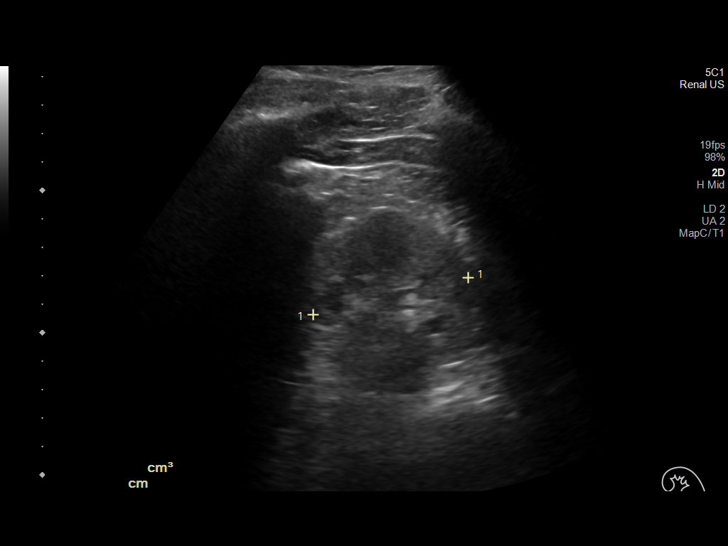
[im 50/60]
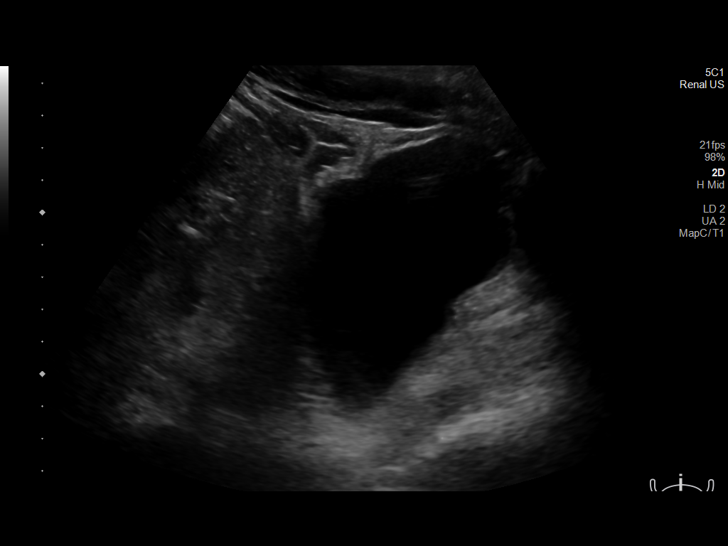
[im 55/60]
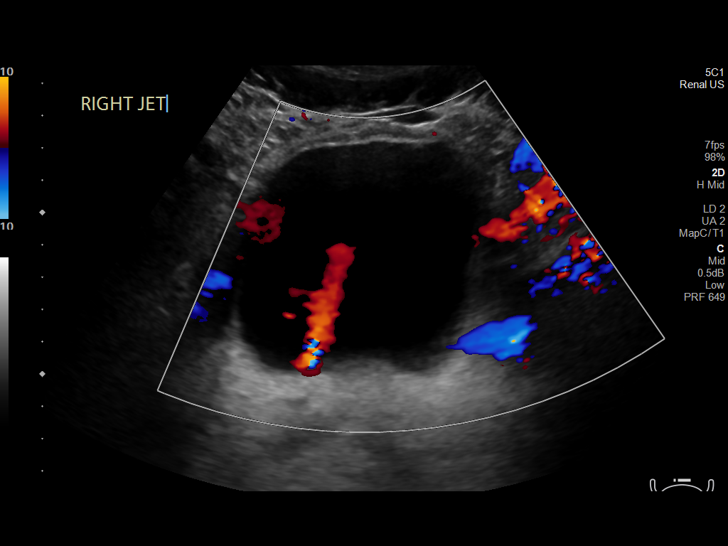
[im 60/60]
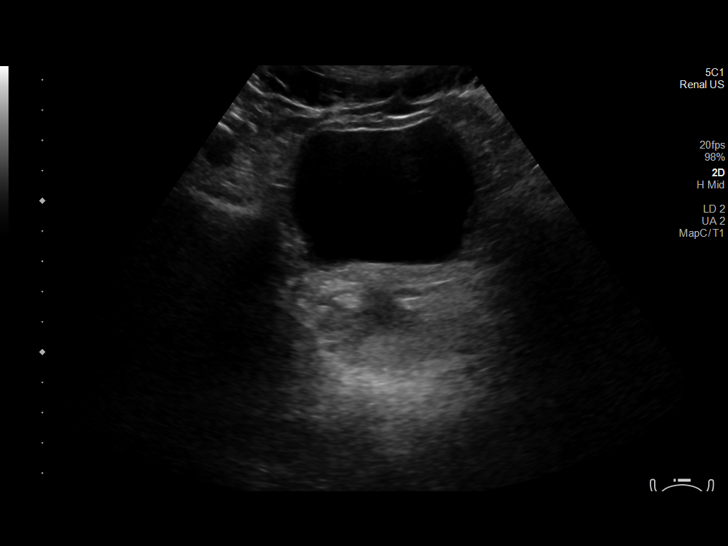

[14 of 25 positions shown; findings below may reference images not displayed]

FINDINGS: Right Kidney:

Renal measurements: 10.3 x 4.7 x 4.7 cm = volume: 138.2 mL. Normal
renal cortical thickness. Mild diffuse increased echogenicity is
noted. No worrisome renal lesions or hydronephrosis.

There is a stable suprarenal measuring approximately 6.8 x 5.3 x
cm. This is also been seen on prior CT scans.

Left Kidney:

Renal measurements: 10.0 x 5.7 x 5.6 cm = volume: 167.5 mL. Normal
renal cortical thickness. Slight increased echogenicity. No
worrisome renal lesions are identified. A few small renal cysts are
noted.

Bladder:

Appears normal for degree of bladder distention.

Other:

None.
IMPRESSION: 1. Increased echogenicity of both kidneys suggesting medical renal
disease. Normal renal cortical thickness and no worrisome renal
lesions or hydronephrosis.
2. Stable bilobed suprarenal cyst on the right.
3. Normal bladder.

## 2023-07-03 DIAGNOSIS — N186 End stage renal disease: Secondary | ICD-10-CM | POA: Diagnosis not present

## 2023-07-03 DIAGNOSIS — D631 Anemia in chronic kidney disease: Secondary | ICD-10-CM | POA: Diagnosis not present

## 2023-07-03 DIAGNOSIS — Z992 Dependence on renal dialysis: Secondary | ICD-10-CM | POA: Diagnosis not present

## 2023-07-03 DIAGNOSIS — N2581 Secondary hyperparathyroidism of renal origin: Secondary | ICD-10-CM | POA: Diagnosis not present

## 2023-07-04 ENCOUNTER — Ambulatory Visit: Payer: Managed Care, Other (non HMO) | Admitting: Physical Therapy

## 2023-07-04 ENCOUNTER — Ambulatory Visit: Payer: 59 | Admitting: Podiatry

## 2023-07-05 ENCOUNTER — Telehealth: Payer: Self-pay

## 2023-07-05 NOTE — Telephone Encounter (Signed)
Patient Name: George Day MRN: [id number] DOB:1976-05-11, 47 y.o., male Today's Date: 07/05/2023   Called and spoke to patient. Patient was reminded that he has had 3 consecutive no shows since his evaluation. Pt was reminded of our 3 cancellations/no shows policy. Pt was reminded of his next 2 follow up appointment dates/times. Pt was educated to give [place] call if he is unable to make it those appts. Further cancellations/no shows can lead to automatic discharge from skilled PT. Pt verbalized agreement.   Ileana Ladd, PT 07/05/2023, 8:49 AM

## 2023-07-08 DIAGNOSIS — Z992 Dependence on renal dialysis: Secondary | ICD-10-CM | POA: Diagnosis not present

## 2023-07-08 DIAGNOSIS — D631 Anemia in chronic kidney disease: Secondary | ICD-10-CM | POA: Diagnosis not present

## 2023-07-08 DIAGNOSIS — N2581 Secondary hyperparathyroidism of renal origin: Secondary | ICD-10-CM | POA: Diagnosis not present

## 2023-07-08 DIAGNOSIS — N186 End stage renal disease: Secondary | ICD-10-CM | POA: Diagnosis not present

## 2023-07-09 ENCOUNTER — Ambulatory Visit: Payer: Managed Care, Other (non HMO) | Admitting: Physical Therapy

## 2023-07-11 ENCOUNTER — Ambulatory Visit: Payer: Managed Care, Other (non HMO) | Admitting: Occupational Therapy

## 2023-07-11 ENCOUNTER — Ambulatory Visit: Payer: Managed Care, Other (non HMO) | Admitting: Physical Therapy

## 2023-07-11 ENCOUNTER — Encounter: Payer: Self-pay | Admitting: Physical Therapy

## 2023-07-11 NOTE — Therapy (Signed)
PHYSICAL THERAPY DISCHARGE SUMMARY  Visits from Start of Care: 1  Current functional level related to goals / functional outcomes: Unable to assess; not returned since eval   Remaining deficits: Unable to assess; not returned since eval   Education / Equipment: Unable to assess; not returned since eval   Patient agrees to discharge. Patient goals were not met. Patient is being discharged due to not returning since the last visit.  Patient D/C of no show cancellation policy; patient made aware of policy. For this reason, D/C from PT.

## 2023-07-12 DIAGNOSIS — Z992 Dependence on renal dialysis: Secondary | ICD-10-CM | POA: Diagnosis not present

## 2023-07-12 DIAGNOSIS — N186 End stage renal disease: Secondary | ICD-10-CM | POA: Diagnosis not present

## 2023-07-12 DIAGNOSIS — D631 Anemia in chronic kidney disease: Secondary | ICD-10-CM | POA: Diagnosis not present

## 2023-07-12 DIAGNOSIS — N2581 Secondary hyperparathyroidism of renal origin: Secondary | ICD-10-CM | POA: Diagnosis not present

## 2023-07-16 ENCOUNTER — Ambulatory Visit: Payer: 59 | Admitting: Physical Therapy

## 2023-07-20 DIAGNOSIS — N186 End stage renal disease: Secondary | ICD-10-CM | POA: Diagnosis not present

## 2023-07-20 DIAGNOSIS — Z992 Dependence on renal dialysis: Secondary | ICD-10-CM | POA: Diagnosis not present

## 2023-07-20 DIAGNOSIS — I120 Hypertensive chronic kidney disease with stage 5 chronic kidney disease or end stage renal disease: Secondary | ICD-10-CM | POA: Diagnosis not present

## 2023-07-23 ENCOUNTER — Ambulatory Visit: Payer: 59 | Attending: Neurology | Admitting: Occupational Therapy

## 2023-07-23 ENCOUNTER — Ambulatory Visit: Payer: 59 | Admitting: Physical Therapy

## 2023-07-23 ENCOUNTER — Telehealth: Payer: Self-pay | Admitting: Occupational Therapy

## 2023-07-23 DIAGNOSIS — R278 Other lack of coordination: Secondary | ICD-10-CM | POA: Diagnosis present

## 2023-07-23 DIAGNOSIS — R2689 Other abnormalities of gait and mobility: Secondary | ICD-10-CM | POA: Diagnosis present

## 2023-07-23 DIAGNOSIS — R269 Unspecified abnormalities of gait and mobility: Secondary | ICD-10-CM | POA: Insufficient documentation

## 2023-07-23 DIAGNOSIS — G35 Multiple sclerosis: Secondary | ICD-10-CM | POA: Insufficient documentation

## 2023-07-23 DIAGNOSIS — R29898 Other symptoms and signs involving the musculoskeletal system: Secondary | ICD-10-CM | POA: Insufficient documentation

## 2023-07-23 DIAGNOSIS — R29818 Other symptoms and signs involving the nervous system: Secondary | ICD-10-CM | POA: Insufficient documentation

## 2023-07-23 DIAGNOSIS — M6281 Muscle weakness (generalized): Secondary | ICD-10-CM | POA: Insufficient documentation

## 2023-07-23 DIAGNOSIS — R208 Other disturbances of skin sensation: Secondary | ICD-10-CM | POA: Diagnosis present

## 2023-07-23 NOTE — Therapy (Unsigned)
OUTPATIENT OCCUPATIONAL THERAPY NEURO EVALUATION  Patient Name: George Day MRN: [id number] DOB:March 15, 1976, 47 y.o., male Today's Date: 07/23/2023  PCP: Dr. [name] REFERRING PROVIDER: Levert Feinstein, MD  END OF SESSION:  OT End of Session - 07/23/23 1538     Visit Number 1    Number of Visits 7    Date for OT Re-Evaluation 09/05/22    Authorization Type [employer]    OT Start Time 1541    OT Stop Time 1614    OT Time Calculation (min) 33 min    Activity Tolerance Patient tolerated treatment well    Behavior During Therapy WFL for tasks assessed/performed             Past Medical History:  Diagnosis Date   (HFpEF) heart failure with preserved ejection fraction (HCC)    Anemia 1998   Anginal pain (HCC)    Anxiety    Asthma    CKD (chronic kidney disease) stage 5, GFR less than 15 ml/min (HCC)    Coronary artery disease    Diabetes mellitus without complication (HCC) 1995   Hypertension 2019   MI (myocardial infarction) (HCC) 2010   Multiple sclerosis (HCC)    Sleep apnea    Stroke (HCC) 2010   Past Surgical History:  Procedure Laterality Date   AV FISTULA PLACEMENT Left 04/24/2023   Procedure: INSERTION OF LEFT ARM ARTERIOVENOUS (AV) GOR-TEX GRAFT;  Surgeon: Leonie Douglas, MD;  Location: [facility] OR;  Service: Vascular;  Laterality: Left;  with regional block   BIOPSY  08/18/2022   Procedure: BIOPSY;  Surgeon: Imogene Burn, MD;  Location: [facility] ENDOSCOPY;  Service: Gastroenterology;;   CARDIAC CATHETERIZATION  2010   COLONOSCOPY WITH PROPOFOL N/A 08/18/2022   Procedure: COLONOSCOPY WITH PROPOFOL;  Surgeon: Imogene Burn, MD;  Location: [facility] ENDOSCOPY;  Service: Gastroenterology;  Laterality: N/A;   ESOPHAGOGASTRODUODENOSCOPY (EGD) WITH PROPOFOL N/A 08/18/2022   Procedure: ESOPHAGOGASTRODUODENOSCOPY (EGD) WITH PROPOFOL;  Surgeon: Imogene Burn, MD;  Location: [facility] ENDOSCOPY;  Service: Gastroenterology;  Laterality: N/A;   IR FLUORO GUIDE CV LINE RIGHT  03/05/2023   IR  US GUIDE VASC ACCESS RIGHT  03/05/2023   POLYPECTOMY  08/18/2022   Procedure: POLYPECTOMY;  Surgeon: Imogene Burn, MD;  Location: [facility] ENDOSCOPY;  Service: Gastroenterology;;   Patient Active Problem List   Diagnosis Date Noted   Gait abnormality 06/13/2023   Multiple sclerosis (HCC) 03/04/2023   (HFpEF) heart failure with preserved ejection fraction (HCC) 03/04/2023   Chronic kidney disease (CKD), stage V (HCC) 03/03/2023   Chronic diastolic (congestive) heart failure (HCC) 02/13/2023   Elevated troponin 02/13/2023   Acute respiratory distress 02/13/2023   COPD (chronic obstructive pulmonary disease) (HCC) 02/13/2023   Chronic diastolic heart failure (HCC) 10/12/2022   Chronic focal neurological deficit 10/12/2022   Hypervolemia 09/17/2022   Anasarca 09/17/2022   Hematochezia 08/18/2022   Pericardial effusion 08/17/2022   Coronary artery disease 08/17/2022   Homelessness 08/16/2022   Hypertensive urgency 02/19/2021   Acute kidney injury superimposed on chronic kidney disease (HCC)    Hypokalemia    Accelerated hypertension 02/16/2021   Right adrenal mass (HCC) 11/28/2020   Anemia 08/31/2020   Former smoker 08/29/2020   Hyperlipidemia associated with type 2 diabetes mellitus (HCC) 08/29/2020   Microcytic anemia 08/29/2020   Influenza vaccination declined 08/29/2020   23-polyvalent pneumococcal polysaccharide vaccine declined 08/29/2020   Chest pain 08/08/2020   GAD (generalized anxiety disorder) 02/25/2020   Hyperlipidemia 02/25/2020   CKD (chronic kidney disease)  stage 5, GFR less than 15 ml/min (HCC) 02/25/2020   Diabetes mellitus (HCC) 01/05/2020   Essential hypertension 01/05/2020   Tobacco dependence 01/05/2020   Coronary artery disease involving native coronary artery of native heart without angina pectoris 01/05/2020   History of CVA with residual deficit 01/05/2020   Moderate persistent asthma without complication 01/05/2020   Class 1 obesity due to excess calories  with serious comorbidity and body mass index (BMI) of 32.0 to 32.9 in adult 01/05/2020    ONSET DATE: 06/18/2023 (Date of referral)  REFERRING DIAG: G35 (ICD-10-CM) - Multiple sclerosis   THERAPY DIAG:  Muscle weakness (generalized)  Other lack of coordination  Other disturbances of skin sensation  Other symptoms and signs involving the nervous system  Other symptoms and signs involving the musculoskeletal system  Rationale for Evaluation and Treatment: Rehabilitation  SUBJECTIVE:   SUBJECTIVE STATEMENT: Pt reports his Right hand feels weak. He was unable to see PT as scheduled due to busy schedule. He is now able to keep a therapy schedule and is agreeable to PT eval.   Pt accompanied by: self  PERTINENT HISTORY: MS, end stage renal disease, heart failure  PRECAUTIONS: Fall; AV FISTULA PLACEMENT Left 04/24/2023; Impaired BUE sensation   WEIGHT BEARING RESTRICTIONS: No  PAIN:  Are you having pain? Yes: NPRS scale: 6/10 Pain location: bil hands and fee, lower back Pain description: neuropathic pain- pins and needles Aggravating factors: none Relieving factors: none  FALLS: Has patient fallen in last 6 months? No  LIVING ENVIRONMENT: Lives with: lives with their spouse Lives in: House/apartment Stairs: Yes: Internal: 14 steps; on right going up and External: 4 steps; none Has following equipment at home: None  PLOF: Independent; on disability Retail banker); driving; drawing, writing, music  PATIENT GOALS: To participate in occupations at desired level of function  OBJECTIVE:  Note: Objective measures were completed at Evaluation unless otherwise noted.  HAND DOMINANCE: Right  ADLs: Overall ADLs: mod I  IADLs: Overall ADLs: mod I  MOBILITY STATUS: Independent  ACTIVITY TOLERANCE: Activity tolerance: good  FUNCTIONAL OUTCOME MEASURES: Quick Dash: 38.6 % disability    UPPER EXTREMITY ROM:    BUE WFL though forward neck posturing with burning in RUE  with IR and ER  UPPER EXTREMITY MMT:     BUE WNL  HAND FUNCTION: Grip strength: Right: 83.9 lbs; Left: 114.6 lbs  COORDINATION: 9 Hole Peg test: Right: 32 sec; Left: 24 sec  SENSATION: Paresthesias in B hands - severe (R>L)  EDEMA: none reported or observed  MUSCLE TONE: WNL  COGNITION: Overall cognitive status: Within functional limits for tasks assessed  VISION: Subjective report: visual acuity worse distance and near; sees eye doctor every year Baseline vision: Bifocals and left glasses in car  VISION ASSESSMENT: WFL  PERCEPTION: WFL  PRAXIS: WFL  OBSERVATIONS: Pt appears well-kept. Some issues with BUE ROM and coordination with pain and paresthesias.   TODAY'S TREATMENT:  N/A for this visit  PATIENT EDUCATION: Education details: OT Role and POC Person educated: Patient Education method: Explanation Education comprehension: verbalized understanding  HOME EXERCISE PROGRAM: N/A for this visit  GOALS:  SHORT TERM GOALS: Target date: 08/21/2023  Pt will independently recall the 5 main sensory precautions (cold, heat, sharp, chemical, and heavy) as needed to prevent injury/harm secondary to impairments.   Baseline: Goal status: INITIAL  2.  Patient will independently verbalize at least 3 energy conservation principles in relation to ADLs to increase functional independence.  Baseline:  Goal status: INITIAL   LONG TERM GOALS: Target date: 09/06/2023  Patient will demonstrate updated RUE and LUE HEP with 25% verbal cues or less for proper execution. Baseline:  Goal status: INITIAL  2.  Pt will report improvements to sleep secondary to improved BUE pain management/positioning.  Baseline: moderate difficulty sleeping due to severe BUE pain Goal status: INITIAL  3.  Pt will report no more than mild difficulty washing back with use of  AD as needed.  Baseline: moderate difficulty Goal status: INITIAL  4.  Patient will demonstrate at least 93.9 lbs R grip strength as needed to open jars and other containers. Baseline: 83.9 lbs Goal status: INITIAL   ASSESSMENT:  CLINICAL IMPRESSION: Patient is a 47 y.o. male who was seen today for occupational therapy evaluation for MS. Hx includes MS, end stage renal disease, heart failure. Patient currently presents slightly below baseline level of functioning demonstrating functional deficits and impairments as noted below. Pt would benefit from skilled OT services in the outpatient setting to work on impairments as noted below to help pt return to PLOF as able.    PERFORMANCE DEFICITS: in functional skills including ADLs, IADLs, coordination, proprioception, sensation, ROM, strength, pain, Fine motor control, endurance, decreased knowledge of precautions, and UE functional use.   IMPAIRMENTS: are limiting patient from ADLs, IADLs, rest and sleep, and leisure.   CO-MORBIDITIES: may have co-morbidities  that affects occupational performance. Patient will benefit from skilled OT to address above impairments and improve overall function.  MODIFICATION OR ASSISTANCE TO COMPLETE EVALUATION: Min-Moderate modification of tasks or assist with assess necessary to complete an evaluation.  OT OCCUPATIONAL PROFILE AND HISTORY: Problem focused assessment: Including review of records relating to presenting problem.  CLINICAL DECISION MAKING: LOW - limited treatment options, no task modification necessary  REHAB POTENTIAL: Good  EVALUATION COMPLEXITY: Low    PLAN:  OT FREQUENCY: 1x/week  OT DURATION: 6 weeks  PLANNED INTERVENTIONS: 97168 OT Re-evaluation, 97535 self care/ADL training, 40981 therapeutic exercise, 97530 therapeutic activity, 97112 neuromuscular re-education, 97035 ultrasound, 97039 fluidotherapy, 97010 moist heat, 97032 electrical stimulation (manual), energy conservation,  coping strategies training, patient/family education, and DME and/or AE instructions  RECOMMENDED OTHER SERVICES: PT, will ask for new referral  CONSULTED AND AGREED WITH PLAN OF CARE: Patient  PLAN FOR NEXT SESSION: EC strategies; putty; coordination; sleep positioning; pain management; sensory interventions   Delana Meyer, OT 07/23/2023, 5:13 PM

## 2023-07-23 NOTE — Telephone Encounter (Signed)
Hello [name],   I saw this pt for OT eval today. I do feel he needs to be seen by PT but unfortunately had to be d/c due to poor attendance. He reports he would like to be seen by PT again now that life circumstances have changed. If approved, please send referral via order entry to [facility] workque through Epic or fax order to [phone].  Shelbie Proctor, OTR/L 07/23/2023 Occupational Therapy [facility] Phone: [phone] Fax: [phone]

## 2023-07-24 ENCOUNTER — Other Ambulatory Visit: Payer: Self-pay

## 2023-07-24 ENCOUNTER — Encounter: Payer: Self-pay | Admitting: Neurology

## 2023-07-24 ENCOUNTER — Ambulatory Visit (INDEPENDENT_AMBULATORY_CARE_PROVIDER_SITE_OTHER): Payer: 59 | Admitting: Neurology

## 2023-07-24 VITALS — BP 196/108 | HR 73 | Ht 68.0 in | Wt 215.0 lb

## 2023-07-24 DIAGNOSIS — G35 Multiple sclerosis: Secondary | ICD-10-CM | POA: Diagnosis not present

## 2023-07-24 DIAGNOSIS — R269 Unspecified abnormalities of gait and mobility: Secondary | ICD-10-CM

## 2023-07-24 DIAGNOSIS — R202 Paresthesia of skin: Secondary | ICD-10-CM | POA: Diagnosis not present

## 2023-07-24 MED ORDER — DULOXETINE HCL 30 MG PO CPEP
30.0000 mg | ORAL_CAPSULE | Freq: Every day | ORAL | 11 refills | Status: DC
  Filled 2023-07-24: qty 30, 30d supply, fill #0

## 2023-07-24 MED ORDER — DULOXETINE HCL 30 MG PO CPEP: 30.0000 mg | ORAL_CAPSULE | Freq: Every day | ORAL | 11 refills | Status: DC

## 2023-07-24 NOTE — Progress Notes (Signed)
Chief Complaint  Patient presents with   Procedure    Rm 15 alone Pt is well, reports he is getting a persistent tingling/ burning sensation on his R side that wakes him up at night.  No other concerns.       ASSESSMENT AND PLAN  George Day is a 47 y.o. male   Slow worsening gait abnormality, End-stage renal disease, on hemodialysis Monday Wednesday Friday  Abnormal MRI of brain, cervical and thoracic spine, suggestive of multiple sclerosis,  Ordered MRI of brain, cervical, and thoracic spine with without contrast, to better evaluate the lesion, help decision plan, will coordinate hemodialysis right after MRIs, gadolinium contrast administration  Laboratory evaluations: Positive JC virus titer 1.32 Acute onset of right side paresthesia,  Worrisome for left hemisphere pathology, will have MRIs of the brain and spines on August 09, 2023,  Low-dose of Cymbalta 30 mg daily  Follow-up plan depend on MRI findings DIAGNOSTIC DATA (LABS, IMAGING, TESTING) - I reviewed patient records, labs, notes, testing and imaging myself where available.   MEDICAL HISTORY:  George Day 47 year old male, seen in request by primary care Dr. Heide Spark, Christy Sartorius, for evaluation of gait abnormality, abnormal MRIs, initial evaluation June 13, 2023   History is obtained from the patient and review of electronic medical records. I personally reviewed pertinent available imaging films in PACS.   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  MRI of thoracic spine July 2022, T1 signal abnormality, 2 smaller T2/STIR hyperintensity lesion involving T4-T5, also T9 He denies bowel and bladder incontinence,  UPDATE Dec 4th 2024: MRI pending for August 09, 2023, is a Friday, he will have dialysis right after MRI with contrast  He complains sudden onset of right face arm and neck numbness around October, intermittent, only happened when he goes to sleep, did not bother him during the daytime  He will often wake up from sleep even a short nap dense numbness tingly uncomfortable sensation involving right side of his body    PHYSICAL EXAM:   Vitals:   07/24/23 1512 07/24/23 1520 07/24/23 1523  BP: (!) 226/111 (!) 200/100 (!) 196/108  Pulse: 70 73   Weight: 215 lb (97.5 kg)    Height: 5\' 8"  (1.727 m)     Not recorded     Body mass index is 32.69 kg/m.  PHYSICAL EXAMNIATION:  Gen: NAD, conversant, well nourised, well groomed                     Cardiovascular: Regular rate rhythm, no peripheral edema, warm, nontender.  Eyes: Conjunctivae clear without exudates or hemorrhage Neck: Supple, no carotid bruits. Pulmonary: Clear to auscultation bilaterally   NEUROLOGICAL EXAM:  MENTAL STATUS: Speech/cognition: Awake, alert, oriented to history taking and casual conversation CRANIAL NERVES: CN II: Visual fields are full to confrontation. Pupils are round equal and briskly reactive to light. CN III, IV, VI: extraocular movement are  normal. No ptosis. CN V: Facial sensation is intact to light touch CN VII: Face is symmetric with normal eye closure  CN VIII: Hearing is normal to causal conversation. CN IX, X: Phonation is normal. CN XI: Head turning and shoulder shrug are intact  MOTOR: Mild to moderate right hip flexion, ankle dorsiflexion weakness  REFLEXES: Reflexes are 2+ and symmetric at the biceps, 3/3 triceps, knees, and ankles. Plantar responses are extensor bilaterally  SENSORY: Intact to light touch, pinprick and vibratory sensation are intact in fingers and toes.  COORDINATION: There is no trunk or limb dysmetria noted.  GAIT/STANCE: Push-up to get up from seated position, unsteady dragging right leg  REVIEW OF SYSTEMS:  Full 14 system review of systems performed and notable only for as above All other review of systems were negative.   ALLERGIES: Allergies  Allergen Reactions   Honey Bee Venom [Bee Venom] Anaphylaxis   Penicillins Anaphylaxis    ALL   Tomato Swelling    HOME MEDICATIONS: Current Outpatient Medications  Medication Sig Dispense Refill   amLODipine (NORVASC) 10 MG tablet TAKE 1 TABLET BY MOUTH DAILY 30 tablet 10   aspirin EC 81 MG tablet Take 1 tablet (81 mg total) by mouth daily. 90 tablet 1   atorvastatin (LIPITOR) 40 MG tablet TAKE 1 TABLET BY MOUTH DAILY 30 tablet 10   carvedilol (COREG) 25 MG tablet TAKE TWO (2) TABLETS BY MOUTH TWICE DAILY 120 tablet 10   Doxercalciferol (HECTOROL IV) Doxercalciferol (Hectorol)     famotidine (PEPCID) 20 MG tablet Take 1 tablet (20 mg total) by mouth daily. 90 tablet 1   Fluticasone-Umeclidin-Vilant (TRELEGY ELLIPTA) 100-62.5-25 MCG/ACT AEPB Inhale 1 puff into the lungs daily. 60 each 5   nitroGLYCERIN (NITROSTAT) 0.6 MG SL tablet Place 1 tablet (0.6 mg total) under the tongue every 5 (five) minutes as needed for chest pain. 100 tablet 5   olmesartan (BENICAR) 20 MG tablet Take 1 tablet (20 mg total) by mouth at bedtime for high blood  pressure. 30 tablet 11   oxyCODONE-acetaminophen (PERCOCET) 5-325 MG tablet Take 1 tablet by mouth every 6 (six) hours as needed. 20 tablet 0   potassium chloride SA (KLOR-CON M) 20 MEQ tablet Take 1 tablet (20 mEq total) by mouth daily.     sevelamer carbonate (RENVELA) 800 MG tablet Take 1 tablet (800 mg total) by mouth 3 (three) times daily with meals. 270 tablet 3   No current facility-administered medications for this visit.    PAST MEDICAL HISTORY: Past Medical History:  Diagnosis Date   (HFpEF) heart failure with preserved ejection fraction (HCC)    Anemia 1998   Anginal pain (HCC)    Anxiety    Asthma    CKD (chronic kidney disease) stage 5, GFR less than 15 ml/min (HCC)    Coronary artery disease    Diabetes mellitus without complication (HCC) 1995   Hypertension 2019   MI (myocardial infarction) (HCC) 2010   Multiple sclerosis (HCC)    Sleep apnea    Stroke (HCC) 2010    PAST SURGICAL HISTORY: Past Surgical History:  Procedure Laterality Date   AV FISTULA  PLACEMENT Left 04/24/2023   Procedure: INSERTION OF LEFT ARM ARTERIOVENOUS (AV) GOR-TEX GRAFT;  Surgeon: Leonie Douglas, MD;  Location: [facility] OR;  Service: Vascular;  Laterality: Left;  with regional block   BIOPSY  08/18/2022   Procedure: BIOPSY;  Surgeon: Imogene Burn, MD;  Location: [facility] ENDOSCOPY;  Service: Gastroenterology;;   CARDIAC CATHETERIZATION  2010   COLONOSCOPY WITH PROPOFOL N/A 08/18/2022   Procedure: COLONOSCOPY WITH PROPOFOL;  Surgeon: Imogene Burn, MD;  Location: [facility] ENDOSCOPY;  Service: Gastroenterology;  Laterality: N/A;   ESOPHAGOGASTRODUODENOSCOPY (EGD) WITH PROPOFOL N/A 08/18/2022   Procedure: ESOPHAGOGASTRODUODENOSCOPY (EGD) WITH PROPOFOL;  Surgeon: Imogene Burn, MD;  Location: [facility] ENDOSCOPY;  Service: Gastroenterology;  Laterality: N/A;   IR FLUORO GUIDE CV LINE RIGHT  03/05/2023   IR US GUIDE VASC ACCESS RIGHT  03/05/2023   POLYPECTOMY  08/18/2022   Procedure: POLYPECTOMY;  Surgeon: Imogene Burn, MD;  Location: [facility] ENDOSCOPY;  Service: Gastroenterology;;    FAMILY HISTORY: Family History  Problem Relation Age of Onset   Diabetes Mother    Hyperlipidemia Mother    Kidney disease Mother    Drug abuse Father    Diabetes Sister    Hyperlipidemia Sister    Hearing loss Sister    Diabetes Brother    Hyperlipidemia Brother     SOCIAL HISTORY: Social History   Socioeconomic History   Marital status: Married    Spouse name: Not on file   Number of children: 4   Years of education: Not on file   Highest education level: 10th grade  Occupational History   Occupation: Hydrologist  Tobacco Use   Smoking status: Former    Current packs/day: 0.00    Average packs/day: 0.5 packs/day for 31.0 years (15.5 ttl pk-yrs)    Types: Cigars, E-cigarettes, Cigarettes    Start date: 09/1990    Quit date: 09/2021    Years since quitting: 1.8   Smokeless tobacco: Never  Vaping Use   Vaping status: Never Used  Substance and Sexual Activity   Alcohol use: Not Currently    Comment: occasioanlly    Drug use: Yes    Types: Marijuana    Comment: 2 x week   Sexual activity: Yes    Birth control/protection: None  Other Topics Concern   Not on file  Social History Narrative   Not on file   Social Determinants of Health   Financial Resource Strain: Medium Risk (09/20/2022)   Overall Financial Resource Strain (CARDIA)    Difficulty of Paying Living Expenses: Somewhat hard  Food Insecurity: No Food Insecurity (03/11/2023)   Hunger Vital Sign    Worried About Running Out of Food in the Last Year: Never true    Ran Out of Food in the Last Year: Never true  Transportation Needs: No Transportation Needs (03/03/2023)   PRAPARE - Administrator, Civil Service (Medical): No    Lack of Transportation (Non-Medical): No  Physical Activity: Not on file  Stress: Not on file  Social Connections: Not on file  Intimate Partner Violence: Not At Risk (03/03/2023)    Humiliation, Afraid, Rape, and Kick questionnaire    Fear of Current or Ex-Partner: No    Emotionally Abused: No    Physically Abused: No    Sexually Abused: No      Levert Feinstein, M.D. Ph.D.  [facility] [address], [address] [place], [place] [address] Ph: [phone] Fax: [phone]  CC:  Rosaura Carpenter,  Ledell Noss, MD [address] [place],  [place] [address]  [name], MD

## 2023-07-24 NOTE — Telephone Encounter (Signed)
Orders Placed This Encounter  Procedures  . Ambulatory referral to Physical Therapy      

## 2023-07-25 ENCOUNTER — Other Ambulatory Visit: Payer: Self-pay

## 2023-07-25 ENCOUNTER — Ambulatory Visit: Payer: Self-pay

## 2023-07-25 ENCOUNTER — Ambulatory Visit: Payer: 59 | Admitting: Physical Therapy

## 2023-07-25 NOTE — Patient Outreach (Signed)
  Care Coordination   07/25/2023 Name: George Day MRN: [id number] DOB: 1975/12/10   Care Coordination Outreach Attempts:  An unsuccessful telephone outreach was attempted for a scheduled appointment today.  Follow Up Plan:  Additional outreach attempts will be made to offer the patient care coordination information and services.   Encounter Outcome:  No Answer   Care Coordination Interventions:  No, not indicated    [name] RN BSN CCM [place]  [facility] Nurse Care Coordinator  Direct Dial: [phone] Website: [email or web address]

## 2023-07-30 ENCOUNTER — Ambulatory Visit: Payer: 59 | Admitting: Physical Therapy

## 2023-07-30 ENCOUNTER — Ambulatory Visit: Payer: 59 | Admitting: Occupational Therapy

## 2023-08-01 ENCOUNTER — Ambulatory Visit: Payer: 59 | Admitting: Physical Therapy

## 2023-08-01 ENCOUNTER — Encounter: Payer: Self-pay | Admitting: Neurology

## 2023-08-06 ENCOUNTER — Encounter: Payer: Self-pay | Admitting: Physical Therapy

## 2023-08-06 ENCOUNTER — Ambulatory Visit: Payer: 59 | Admitting: Physical Therapy

## 2023-08-06 ENCOUNTER — Other Ambulatory Visit: Payer: Self-pay | Admitting: Family

## 2023-08-06 ENCOUNTER — Ambulatory Visit: Payer: 59 | Admitting: Occupational Therapy

## 2023-08-06 VITALS — BP 179/113 | HR 77

## 2023-08-06 DIAGNOSIS — M6281 Muscle weakness (generalized): Secondary | ICD-10-CM

## 2023-08-06 DIAGNOSIS — R278 Other lack of coordination: Secondary | ICD-10-CM

## 2023-08-06 DIAGNOSIS — R2689 Other abnormalities of gait and mobility: Secondary | ICD-10-CM

## 2023-08-06 DIAGNOSIS — I25118 Atherosclerotic heart disease of native coronary artery with other forms of angina pectoris: Secondary | ICD-10-CM

## 2023-08-06 DIAGNOSIS — R29818 Other symptoms and signs involving the nervous system: Secondary | ICD-10-CM

## 2023-08-06 DIAGNOSIS — R208 Other disturbances of skin sensation: Secondary | ICD-10-CM

## 2023-08-06 DIAGNOSIS — R29898 Other symptoms and signs involving the musculoskeletal system: Secondary | ICD-10-CM

## 2023-08-06 NOTE — Therapy (Signed)
Pt arrived but was not seen due to hypertension. See PT note for further detail.

## 2023-08-06 NOTE — Therapy (Signed)
[facility] [address] [place], [place], [address] Phone: [phone]   Fax:  [phone]  Patient Details -ARRIVED NO CHARGE Name: George Day MRN: [id number] Date of Birth: [birth year] Referring Provider:  Levert Feinstein, MD  Encounter Date: 08/06/2023  Pt presented for evaluation with wife, Leafy Ro.  His BP is outside safe parameters for therapy.  See below.  Discussed limits and monitoring BP at home especially with dialysis fluctuations.  He declined need for EMS as he was unsymptomatic at time of evaluation.  He was educated on emergent signs/symptoms and to contact PCP for medication management should he continue to steadily trend this high due to concerns given PMH.  He verbalized understanding.  Pt was escorted to the front with spouse and instructed to check BP prior to coming to rescheduled evaluation.  PT did speak to OT prior to releasing pt and they were in agreement to not see patient this visit due to same concerns.  RUE in sitting: Vitals:   08/06/23 1320 08/06/23 1328  BP: (!) 193/117 (!) 179/113  Pulse: 76 77   Sadie Haber, PT, DPT 08/06/2023, 3:23 PM  [facility] [address] [place], [place], [address] Phone: [phone]   Fax:  [phone]

## 2023-08-06 NOTE — Telephone Encounter (Signed)
MRI brain [facility] medicaid auth: [id number] exp. 08/06/23-09/20/23 MRI cervical [employer] medicaid auth: [id number] exp. 08/06/23-09/20/23 MRI thoracic [employer] medicaid auth: [id number] exp. 08/06/23-09/20/23

## 2023-08-07 ENCOUNTER — Encounter: Payer: Self-pay | Admitting: Neurology

## 2023-08-08 ENCOUNTER — Encounter: Payer: Self-pay | Admitting: Neurology

## 2023-08-08 ENCOUNTER — Ambulatory Visit: Payer: 59 | Admitting: Physical Therapy

## 2023-08-09 ENCOUNTER — Inpatient Hospital Stay: Admission: RE | Admit: 2023-08-09 | Payer: 59 | Source: Ambulatory Visit

## 2023-08-09 ENCOUNTER — Other Ambulatory Visit: Payer: 59

## 2023-08-12 ENCOUNTER — Ambulatory Visit: Payer: 59 | Admitting: Occupational Therapy

## 2023-08-12 NOTE — Therapy (Deleted)
OUTPATIENT OCCUPATIONAL THERAPY NEURO EVALUATION  Patient Name: George Day MRN: [id number] DOB:[birth year], 47 y.o., male Today's Date: 08/12/2023  PCP: Dr. [name] REFERRING PROVIDER: Levert Feinstein, MD  END OF SESSION:    Past Medical History:  Diagnosis Date   (HFpEF) heart failure with preserved ejection fraction (HCC)    Anemia 1998   Anginal pain (HCC)    Anxiety    Asthma    CKD (chronic kidney disease) stage 5, GFR less than 15 ml/min (HCC)    Coronary artery disease    Diabetes mellitus without complication (HCC) 1995   Hypertension 2019   MI (myocardial infarction) (HCC) 2010   Multiple sclerosis (HCC)    Sleep apnea    Stroke (HCC) 2010   Past Surgical History:  Procedure Laterality Date   AV FISTULA PLACEMENT Left 04/24/2023   Procedure: INSERTION OF LEFT ARM ARTERIOVENOUS (AV) GOR-TEX GRAFT;  Surgeon: Leonie Douglas, MD;  Location: [facility] OR;  Service: Vascular;  Laterality: Left;  with regional block   BIOPSY  08/18/2022   Procedure: BIOPSY;  Surgeon: Imogene Burn, MD;  Location: [facility] ENDOSCOPY;  Service: Gastroenterology;;   CARDIAC CATHETERIZATION  2010   COLONOSCOPY WITH PROPOFOL N/A 08/18/2022   Procedure: COLONOSCOPY WITH PROPOFOL;  Surgeon: Imogene Burn, MD;  Location: [facility] ENDOSCOPY;  Service: Gastroenterology;  Laterality: N/A;   ESOPHAGOGASTRODUODENOSCOPY (EGD) WITH PROPOFOL N/A 08/18/2022   Procedure: ESOPHAGOGASTRODUODENOSCOPY (EGD) WITH PROPOFOL;  Surgeon: Imogene Burn, MD;  Location: [facility] ENDOSCOPY;  Service: Gastroenterology;  Laterality: N/A;   IR FLUORO GUIDE CV LINE RIGHT  03/05/2023   IR US GUIDE VASC ACCESS RIGHT  03/05/2023   POLYPECTOMY  08/18/2022   Procedure: POLYPECTOMY;  Surgeon: Imogene Burn, MD;  Location: [facility] ENDOSCOPY;  Service: Gastroenterology;;   Patient Active Problem List   Diagnosis Date Noted   Paresthesia 07/24/2023   Gait abnormality 06/13/2023   Multiple sclerosis (HCC) 03/04/2023   (HFpEF) heart failure with  preserved ejection fraction (HCC) 03/04/2023   Chronic kidney disease (CKD), stage V (HCC) 03/03/2023   Chronic diastolic (congestive) heart failure (HCC) 02/13/2023   Elevated troponin 02/13/2023   Acute respiratory distress 02/13/2023   COPD (chronic obstructive pulmonary disease) (HCC) 02/13/2023   Chronic diastolic heart failure (HCC) 10/12/2022   Chronic focal neurological deficit 10/12/2022   Hypervolemia 09/17/2022   Anasarca 09/17/2022   Hematochezia 08/18/2022   Pericardial effusion 08/17/2022   Coronary artery disease 08/17/2022   Homelessness 08/16/2022   Hypertensive urgency 02/19/2021   Acute kidney injury superimposed on chronic kidney disease (HCC)    Hypokalemia    Accelerated hypertension 02/16/2021   Right adrenal mass (HCC) 11/28/2020   Anemia 08/31/2020   Former smoker 08/29/2020   Hyperlipidemia associated with type 2 diabetes mellitus (HCC) 08/29/2020   Microcytic anemia 08/29/2020   Influenza vaccination declined 08/29/2020   23-polyvalent pneumococcal polysaccharide vaccine declined 08/29/2020   Chest pain 08/08/2020   GAD (generalized anxiety disorder) 02/25/2020   Hyperlipidemia 02/25/2020   CKD (chronic kidney disease) stage 5, GFR less than 15 ml/min (HCC) 02/25/2020   Diabetes mellitus (HCC) 01/05/2020   Essential hypertension 01/05/2020   Tobacco dependence 01/05/2020   Coronary artery disease involving native coronary artery of native heart without angina pectoris 01/05/2020   History of CVA with residual deficit 01/05/2020   Moderate persistent asthma without complication 01/05/2020   Class 1 obesity due to excess calories with serious comorbidity and body mass index (BMI) of 32.0 to 32.9 in adult 01/05/2020  ONSET DATE: 06/18/2023 (Date of referral)  REFERRING DIAG: G35 (ICD-10-CM) - Multiple sclerosis   THERAPY DIAG:  No diagnosis found.  Rationale for Evaluation and Treatment: Rehabilitation  SUBJECTIVE:   SUBJECTIVE STATEMENT: Pt  reports his Right hand feels weak. He was unable to see PT as scheduled due to busy schedule. He is now able to keep a therapy schedule and is agreeable to PT eval.   Pt accompanied by: self  PERTINENT HISTORY: MS, end stage renal disease, heart failure  PRECAUTIONS: Fall; AV FISTULA PLACEMENT Left 04/24/2023; Impaired BUE sensation   WEIGHT BEARING RESTRICTIONS: No  PAIN:  Are you having pain? Yes: NPRS scale: 6/10 Pain location: bil hands and fee, lower back Pain description: neuropathic pain- pins and needles Aggravating factors: none Relieving factors: none  FALLS: Has patient fallen in last 6 months? No  LIVING ENVIRONMENT: Lives with: lives with their spouse Lives in: House/apartment Stairs: Yes: Internal: 14 steps; on right going up and External: 4 steps; none Has following equipment at home: None  PLOF: Independent; on disability Retail banker); driving; drawing, writing, music  PATIENT GOALS: To participate in occupations at desired level of function  OBJECTIVE:  Note: Objective measures were completed at Evaluation unless otherwise noted.  HAND DOMINANCE: Right  ADLs: Overall ADLs: mod I  IADLs: Overall ADLs: mod I  MOBILITY STATUS: Independent  ACTIVITY TOLERANCE: Activity tolerance: good  FUNCTIONAL OUTCOME MEASURES: Quick Dash: 38.6 % disability    UPPER EXTREMITY ROM:    BUE WFL though forward neck posturing with burning in RUE with IR and ER  UPPER EXTREMITY MMT:     BUE WNL  HAND FUNCTION: Grip strength: Right: 83.9 lbs; Left: 114.6 lbs  COORDINATION: 9 Hole Peg test: Right: 32 sec; Left: 24 sec  SENSATION: Paresthesias in B hands - severe (R>L)  EDEMA: none reported or observed  MUSCLE TONE: WNL  COGNITION: Overall cognitive status: Within functional limits for tasks assessed  VISION: Subjective report: visual acuity worse distance and near; sees eye doctor every year Baseline vision: Bifocals and left glasses in car  VISION  ASSESSMENT: WFL  PERCEPTION: WFL  PRAXIS: WFL  OBSERVATIONS: Pt appears well-kept. Some issues with BUE ROM and coordination with pain and paresthesias.   TODAY'S TREATMENT:                                                                                                                              N/A for this visit  PATIENT EDUCATION: Education details: OT Role and POC Person educated: Patient Education method: Explanation Education comprehension: verbalized understanding  HOME EXERCISE PROGRAM: N/A for this visit  GOALS:  SHORT TERM GOALS: Target date: 08/21/2023  Pt will independently recall the 5 main sensory precautions (cold, heat, sharp, chemical, and heavy) as needed to prevent injury/harm secondary to impairments.   Baseline: Goal status: INITIAL  2.  Patient will independently verbalize at least 3 energy conservation principles in relation to ADLs to  increase functional independence.  Baseline:  Goal status: INITIAL   LONG TERM GOALS: Target date: 09/06/2023  Patient will demonstrate updated RUE and LUE HEP with 25% verbal cues or less for proper execution. Baseline:  Goal status: INITIAL  2.  Pt will report improvements to sleep secondary to improved BUE pain management/positioning.  Baseline: moderate difficulty sleeping due to severe BUE pain Goal status: INITIAL  3.  Pt will report no more than mild difficulty washing back with use of AD as needed.  Baseline: moderate difficulty Goal status: INITIAL  4.  Patient will demonstrate at least 93.9 lbs R grip strength as needed to open jars and other containers. Baseline: 83.9 lbs Goal status: INITIAL   ASSESSMENT:  CLINICAL IMPRESSION: Patient is a 47 y.o. male who was seen today for occupational therapy evaluation for MS. Hx includes MS, end stage renal disease, heart failure. Patient currently presents slightly below baseline level of functioning demonstrating functional deficits and impairments as  noted below. Pt would benefit from skilled OT services in the outpatient setting to work on impairments as noted below to help pt return to PLOF as able.    PERFORMANCE DEFICITS: in functional skills including ADLs, IADLs, coordination, proprioception, sensation, ROM, strength, pain, Fine motor control, endurance, decreased knowledge of precautions, and UE functional use.   IMPAIRMENTS: are limiting patient from ADLs, IADLs, rest and sleep, and leisure.   CO-MORBIDITIES: may have co-morbidities  that affects occupational performance. Patient will benefit from skilled OT to address above impairments and improve overall function.  MODIFICATION OR ASSISTANCE TO COMPLETE EVALUATION: Min-Moderate modification of tasks or assist with assess necessary to complete an evaluation.  OT OCCUPATIONAL PROFILE AND HISTORY: Problem focused assessment: Including review of records relating to presenting problem.  CLINICAL DECISION MAKING: LOW - limited treatment options, no task modification necessary  REHAB POTENTIAL: Good  EVALUATION COMPLEXITY: Low    PLAN:  OT FREQUENCY: 1x/week  OT DURATION: 6 weeks  PLANNED INTERVENTIONS: 97168 OT Re-evaluation, 97535 self care/ADL training, 96045 therapeutic exercise, 97530 therapeutic activity, 97112 neuromuscular re-education, 97035 ultrasound, 97039 fluidotherapy, 97010 moist heat, 97032 electrical stimulation (manual), energy conservation, coping strategies training, patient/family education, and DME and/or AE instructions  RECOMMENDED OTHER SERVICES: PT, will ask for new referral  CONSULTED AND AGREED WITH PLAN OF CARE: Patient  PLAN FOR NEXT SESSION: EC strategies; putty; coordination; sleep positioning; pain management; sensory interventions   Delana Meyer, OT 08/12/2023, 11:49 AM

## 2023-08-15 ENCOUNTER — Inpatient Hospital Stay: Admission: RE | Admit: 2023-08-15 | Payer: 59 | Source: Ambulatory Visit

## 2023-08-19 ENCOUNTER — Ambulatory Visit: Payer: 59 | Admitting: Physical Therapy

## 2023-08-19 ENCOUNTER — Ambulatory Visit: Payer: 59 | Admitting: Occupational Therapy

## 2023-08-20 DIAGNOSIS — N186 End stage renal disease: Secondary | ICD-10-CM | POA: Diagnosis not present

## 2023-08-20 DIAGNOSIS — I120 Hypertensive chronic kidney disease with stage 5 chronic kidney disease or end stage renal disease: Secondary | ICD-10-CM | POA: Diagnosis not present

## 2023-08-20 DIAGNOSIS — Z992 Dependence on renal dialysis: Secondary | ICD-10-CM | POA: Diagnosis not present

## 2023-08-23 DIAGNOSIS — N186 End stage renal disease: Secondary | ICD-10-CM | POA: Diagnosis not present

## 2023-08-23 DIAGNOSIS — D631 Anemia in chronic kidney disease: Secondary | ICD-10-CM | POA: Diagnosis not present

## 2023-08-23 DIAGNOSIS — Z992 Dependence on renal dialysis: Secondary | ICD-10-CM | POA: Diagnosis not present

## 2023-08-23 DIAGNOSIS — N2581 Secondary hyperparathyroidism of renal origin: Secondary | ICD-10-CM | POA: Diagnosis not present

## 2023-08-26 ENCOUNTER — Ambulatory Visit: Payer: 59 | Admitting: Occupational Therapy

## 2023-08-26 ENCOUNTER — Encounter: Payer: Self-pay | Admitting: Occupational Therapy

## 2023-08-26 NOTE — Therapy (Signed)
 OT discharge completed. Pt reports now is not a good time for therapy. Therapist encouraged pt to follow up with physicians and return to therapy at a better time.

## 2023-09-02 ENCOUNTER — Encounter: Payer: Medicaid Other | Admitting: Occupational Therapy

## 2023-09-02 ENCOUNTER — Telehealth: Payer: Self-pay | Admitting: *Deleted

## 2023-09-02 NOTE — Progress Notes (Signed)
 Complex Care Management Care Guide Note  09/02/2023 Name: Shakai Dolley MRN: [id number] DOB: 10/14/1975  Jaques Mineer is a 48 y.o. year old male who is a primary care patient of Gabino Boga, MD and is actively engaged with the care management team. I reached out to Burnard Pouch by phone today to assist with re-scheduling  with the RN Case Manager.  Follow up plan: Unsuccessful telephone outreach attempt made. A HIPAA compliant phone message was left for the patient providing contact information and requesting a return call.  [facility]  Care Coordination Care Guide  Direct Dial: [phone]

## 2023-09-06 ENCOUNTER — Other Ambulatory Visit: Payer: Self-pay | Admitting: Family

## 2023-09-06 ENCOUNTER — Other Ambulatory Visit: Payer: Self-pay | Admitting: Student

## 2023-09-06 DIAGNOSIS — I25118 Atherosclerotic heart disease of native coronary artery with other forms of angina pectoris: Secondary | ICD-10-CM

## 2023-09-09 NOTE — Progress Notes (Signed)
Complex Care Management Care Guide Note  09/09/2023 Name: [name] MRN: [id number] DOB: [birth year]  George Day is a 48 y.o. year old male who is a primary care patient of [name], MD and is actively engaged with the care management team. I reached out to [name] by phone today to assist with re-scheduling  with the RN Case Manager.  Follow up plan: Telephone appointment with complex care management team member scheduled for:  09/18/23  Gwenevere Ghazi  [facility]  [facility], [facility] Guide  Direct Dial: [phone]  Fax [phone]

## 2023-09-18 ENCOUNTER — Telehealth: Payer: Self-pay | Admitting: *Deleted

## 2023-09-18 ENCOUNTER — Ambulatory Visit: Payer: Self-pay

## 2023-09-18 DIAGNOSIS — N186 End stage renal disease: Secondary | ICD-10-CM

## 2023-09-18 DIAGNOSIS — I10 Essential (primary) hypertension: Secondary | ICD-10-CM

## 2023-09-18 NOTE — Patient Outreach (Addendum)
  Care Coordination   Follow Up Visit Note   09/18/2023 Name: George Day MRN: [id number] DOB: 06-10-1976  George Day is a 48 y.o. year old male who sees George Macadamia, MD for primary care. I spoke with  George Day by phone today.  What matters to the patients health and wellness today?  Patient would like to complete his brain/spinal MRI.     Goals Addressed             This Visit's Progress    To complete brain and spinal MRI       Care Coordination Interventions: Evaluation of current treatment plan related to Multiple Sclerosis  and patient's adherence to plan as established by provider Discussed with patient he did not complete the brain/spinal MRI ordered by George Day, Neurologist in December as directed Placed outbound call to [facility], spoke with George Day, who advised patient cancelled his MRI with [facility] due to lack of transportation  Provided patient with the contact number and address for [facility] and instructed patient to call this provider in order to reschedule his MRI when ready, patient recorded the information and verbalizes understanding  Discussed plans with patient for ongoing care coordination follow up and provided patient with direct contact information for nurse care coordinator Patient will contact [facility] in order to reschedule his brain/spinal MRI Patient will work with [facility] care guide team to determine alternative options for medical transportation Patient will continue to work with nurse care coordinator for chronic disease management and care coordination needs       COMPLETED: To schedule a follow up visit with kidney specialist       Care Coordination Interventions:  Evaluation of current treatment plan related to end stage renal disease self management and patient's adherence to plan as established by provider     Reviewed and discussed with patient his gortex graft is working well and is  being used for his dialysis treatments Educated patient about next steps for requesting to have his dialysis catheter removed, patient referred to his dialysis clinic charge nurse and or kidney doctor  Discussed plans with patient for ongoing care coordination follow up and provided patient with direct contact information for nurse care coordinator    Interventions Today    Flowsheet Row Most Recent Value  Chronic Disease   Chronic disease during today's visit Chronic Kidney Disease/End Stage Renal Disease (ESRD), Other  [Multiple Sclerosis,  impaired gait/mobility]  General Interventions   General Interventions Discussed/Reviewed General Interventions Discussed, General Interventions Reviewed, Doctor Visits, Communication with  Doctor Visits Discussed/Reviewed Doctor Visits Discussed, Doctor Visits Reviewed, PCP, Specialist  Communication with PCP/Specialists  [George Day, [facility]]  Exercise Interventions   Exercise Discussed/Reviewed Exercise Reviewed, Exercise Discussed, Physical Activity  Physical Activity Discussed/Reviewed Physical Activity Reviewed, Physical Activity Discussed, Home Exercise Program (HEP)  Education Interventions   Education Provided Provided Education  Provided Verbal Education On When to see the doctor, Exercise          SDOH assessments and interventions completed:  Yes  SDOH Interventions Today    Flowsheet Row Most Recent Value  SDOH Interventions   Transportation Interventions AMB Referral        Care Coordination Interventions:  Yes, provided   Follow up plan: Referral made to AMB REF for SDOH/transportation resources Follow up call scheduled for 10/16/23 @09 :30 AM    Encounter Outcome:  Patient Visit Completed

## 2023-09-18 NOTE — Progress Notes (Signed)
Complex Care Management Note Care Guide Note  09/18/2023 Name: George Day MRN: [id number] DOB: Apr 27, 1976   Complex Care Management Outreach Attempts: An unsuccessful telephone outreach was attempted today to offer the patient information about available complex care management services.  Follow Up Plan:  Additional outreach attempts will be made to offer the patient complex care management information and services.   Encounter Outcome:  No Answer  [facility] Guide  Direct Dial:[phone] Fax:[phone] Website: [email or web address]

## 2023-09-18 NOTE — Patient Instructions (Signed)
Visit Information  Thank you for taking time to visit with me today. Please don't hesitate to contact me if I can be of assistance to you.   Following are the goals we discussed today:   Goals Addressed             This Visit's Progress    To complete brain and spinal MRI       Care Coordination Interventions: Evaluation of current treatment plan related to Multiple Sclerosis  and patient's adherence to plan as established by provider Discussed with patient he did not complete the brain/spinal MRI ordered by [name], Neurologist in December as directed Placed outbound call to [facility], spoke with [name], who advised patient cancelled his MRI with [facility] due to lack of transportation  Provided patient with the contact number and address for [facility] and instructed patient to call this provider in order to reschedule his MRI when ready, patient recorded the information and verbalizes understanding  Discussed plans with patient for ongoing care coordination follow up and provided patient with direct contact information for nurse care coordinator Patient will contact [facility] in order to reschedule his brain/spinal MRI Patient will work with [facility] care guide team to determine alternative options for medical transportation Patient will continue to work with nurse care coordinator for chronic disease management and care coordination needs       COMPLETED: To schedule a follow up visit with kidney specialist       Care Coordination Interventions:  Evaluation of current treatment plan related to end stage renal disease self management and patient's adherence to plan as established by provider     Reviewed and discussed with patient his gortex graft is working well and is being used for his dialysis treatments Educated patient about next steps for requesting to have his dialysis catheter removed, patient referred to his dialysis clinic charge  nurse and or kidney doctor  Discussed plans with patient for ongoing care coordination follow up and provided patient with direct contact information for nurse care coordinator        Our next appointment is by telephone on 10/16/23 at 09:30 AM  Please call the care guide team at [phone] if you need to cancel or reschedule your appointment.   If you are experiencing a Mental Health or Behavioral Health Crisis or need someone to talk to, please call 1-800-273-TALK (toll free, 24 hour hotline)  Patient verbalizes understanding of instructions and care plan provided today and agrees to view in MyChart. Active MyChart status and patient understanding of how to access instructions and care plan via MyChart confirmed with patient.     [name] RN BSN CCM [facility]  [facility], [facility] [facility] Nurse Care Coordinator  Direct Dial: [phone] Website: [email or web address]

## 2023-09-19 ENCOUNTER — Ambulatory Visit (INDEPENDENT_AMBULATORY_CARE_PROVIDER_SITE_OTHER): Payer: 59 | Admitting: Student

## 2023-09-19 ENCOUNTER — Telehealth: Payer: Self-pay | Admitting: *Deleted

## 2023-09-19 VITALS — BP 198/104 | HR 82 | Temp 98.2°F | Ht 68.5 in | Wt 211.9 lb

## 2023-09-19 DIAGNOSIS — G6289 Other specified polyneuropathies: Secondary | ICD-10-CM

## 2023-09-19 DIAGNOSIS — I5030 Unspecified diastolic (congestive) heart failure: Secondary | ICD-10-CM

## 2023-09-19 DIAGNOSIS — I10 Essential (primary) hypertension: Secondary | ICD-10-CM

## 2023-09-19 DIAGNOSIS — E1122 Type 2 diabetes mellitus with diabetic chronic kidney disease: Secondary | ICD-10-CM | POA: Diagnosis not present

## 2023-09-19 DIAGNOSIS — Z7189 Other specified counseling: Secondary | ICD-10-CM

## 2023-09-19 DIAGNOSIS — I5032 Chronic diastolic (congestive) heart failure: Secondary | ICD-10-CM

## 2023-09-19 DIAGNOSIS — I120 Hypertensive chronic kidney disease with stage 5 chronic kidney disease or end stage renal disease: Secondary | ICD-10-CM | POA: Diagnosis not present

## 2023-09-19 DIAGNOSIS — E1159 Type 2 diabetes mellitus with other circulatory complications: Secondary | ICD-10-CM | POA: Diagnosis not present

## 2023-09-19 DIAGNOSIS — Z0000 Encounter for general adult medical examination without abnormal findings: Secondary | ICD-10-CM | POA: Insufficient documentation

## 2023-09-19 DIAGNOSIS — Z992 Dependence on renal dialysis: Secondary | ICD-10-CM

## 2023-09-19 DIAGNOSIS — N186 End stage renal disease: Secondary | ICD-10-CM | POA: Diagnosis not present

## 2023-09-19 DIAGNOSIS — G629 Polyneuropathy, unspecified: Secondary | ICD-10-CM | POA: Insufficient documentation

## 2023-09-19 MED ORDER — GABAPENTIN 100 MG PO CAPS: ORAL_CAPSULE | ORAL | 3 refills | Status: DC

## 2023-09-19 NOTE — Assessment & Plan Note (Signed)
On dialysis Monday Wednesday Friday, he does have a graft which they use for dialysis.  He still has his port in place since July.  He is unsure who his nephrologist is, but this should be removed as it can be a nidus for infection.    Patient would like to get this removed, has been unable to get help at dialysis regarding this.  Plan: Provided patient with his nephrologist contact information Urged patient to call regarding getting catheter removed.

## 2023-09-19 NOTE — Progress Notes (Signed)
Subjective:  CC: Follow-up on multiple comorbidities  HPI:  George Day is a 48 y.o. person with a past medical history stated below and presents today for the stated chief complaint. Please see problem based assessment and plan for additional details.  Past Medical History:  Diagnosis Date   (HFpEF) heart failure with preserved ejection fraction (HCC)    Anemia 1998   Anginal pain (HCC)    Anxiety    Asthma    CKD (chronic kidney disease) stage 5, GFR less than 15 ml/min (HCC)    Coronary artery disease    Diabetes mellitus without complication (HCC) 1995   Hypertension 2019   MI (myocardial infarction) (HCC) 2010   Multiple sclerosis (HCC)    Sleep apnea    Stroke (HCC) 2010    Current Outpatient Medications on File Prior to Visit  Medication Sig Dispense Refill   amLODipine (NORVASC) 10 MG tablet TAKE 1 TABLET BY MOUTH DAILY 30 tablet 10   aspirin EC 81 MG tablet Take 1 tablet (81 mg total) by mouth daily. 90 tablet 1   atorvastatin (LIPITOR) 40 MG tablet TAKE 1 TABLET BY MOUTH DAILY 30 tablet 10   carvedilol (COREG) 25 MG tablet TAKE TWO (2) TABLETS BY MOUTH TWICE DAILY 120 tablet 10   Doxercalciferol (HECTOROL IV) Doxercalciferol (Hectorol)     DULoxetine (CYMBALTA) 30 MG capsule Take 1 capsule (30 mg total) by mouth daily. 30 capsule 11   famotidine (PEPCID) 20 MG tablet Take 1 tablet (20 mg total) by mouth daily. 90 tablet 1   nitroGLYCERIN (NITROSTAT) 0.6 MG SL tablet Place 1 tablet (0.6 mg total) under the tongue every 5 (five) minutes as needed for chest pain. 100 tablet 5   olmesartan (BENICAR) 20 MG tablet Take 1 tablet (20 mg total) by mouth at bedtime for high blood pressure. 30 tablet 11   oxyCODONE-acetaminophen (PERCOCET) 5-325 MG tablet Take 1 tablet by mouth every 6 (six) hours as needed. 20 tablet 0   potassium chloride SA (KLOR-CON M) 20 MEQ tablet Take 1 tablet (20 mEq total) by mouth daily.     sevelamer carbonate (RENVELA) 800 MG tablet Take 1  tablet (800 mg total) by mouth 3 (three) times daily with meals. 270 tablet 3   TRELEGY ELLIPTA 100-62.5-25 MCG/ACT AEPB INHALE ONE (1) PUFF BY MOUTH DAILY 60 each 10   No current facility-administered medications on file prior to visit.    Review of Systems: Please see assessment and plan for pertinent positives and negatives.  Objective:   Vitals:   09/19/23 0837 09/19/23 0908  BP: (!) 188/104 (!) 198/104  Pulse: 82   Temp: 98.2 F (36.8 C)   TempSrc: Oral   SpO2: 100%   Weight: 211 lb 14.4 oz (96.1 kg)   Height: 5' 8.5" (1.74 m)     Physical Exam: Constitutional: Well-appearing Cardiovascular: Regular rate and rhythm Pulmonary/Chest: lungs clear to auscultation bilaterally Abdominal: soft, non-tender, non-distended Extremities: No edema of the lower extremities bilaterally.  Dorsalis pedis and posterior tibial pulses intact bilaterally. Psych: Pleasant affect Thought process is linear and is goal-directed.  Skin: Dialysis catheter in place over the anterior chest wall.  No signs of erythema or infection at this time.  No tenderness to palpation of the catheter site.   Assessment & Plan:  (HFpEF) heart failure with preserved ejection fraction (HCC) Euvolemic on exam today, no lower extremity edema, no chest pain, no exertional dyspnea.  Dialysis Monday Wednesday Friday.  Essential hypertension  Hypertensive as high as 198/104 today.  Patient states have not taken their blood pressure medications today, and states that they were off the blood pressure medications resuming them around last month.  He is unsure exactly which medications he is taking, does not have them in the room with [place] today.  He is prescribed amlodipine 10, Coreg 25, and olmesartan 20.  I have asked the patient to bring all his medications to next visit.  Discussed with the patient the importance of getting his blood pressure under control, and taking his medications consistently.  Plan: Patient to  take medications consistently over the next week Patient to bring all medications to next visit Patient asked to schedule with me for soonest available appointment.   Diabetes mellitus (HCC) Doing relatively well on status diabetes.  Not on any medications at this time.  Last A1c 6.2, prior A1c's well-controlled as well.  Plan: Foot exam completed today Point-of-care A1c next visit Lipid panel next visit   ESRD on dialysis [facility]) On dialysis Monday Wednesday Friday, he does have a graft which they use for dialysis.  He still has his port in place since July.  He is unsure who his nephrologist is, but this should be removed as it can be a nidus for infection.    Patient would like to get this removed, has been unable to get help at dialysis regarding this.  Plan: Provided patient with his nephrologist contact information Urged patient to call regarding getting catheter removed.   Peripheral neuropathy Patient endorses some ongoing neuropathic pins-and-needles pain in the feet over the past month.  I do see some therapy notes that make mention of this as well.  He does have decree sensation on today's diabetic foot exam.  His A1c has been relatively well-controlled in the past, we will see him in the near future at which time we will recheck his A1c. Plan: Gabapentin 100 daily following dialysis, 3 days a week.   Complex care coordination Patient with multiple complex conditions such as ESRD, type 2 diabetes, MS, new neurological abnormalities.  He has had some issues in the past with transportation, but now has a car so should be easier for him to go to his appointments.  He has had issues controlling his blood pressure and with medication management on multiple past visits.  He is following with [facility], there have been attempts by [facility] to reach the patient, but they have been unsuccessful.  Plan: Continue to follow with [facility] Phone number provided for [facility], patient to call them Patient to bring in all her medications next visit Patient to take their blood pressure medications regularly Patient to call the nephrologist, contact information provided Patient to call [facility] radiology, contact information provided Patient was scheduled with multiple MRIs via neurology to be conducted prior to dialysis, patient no showed appointments due to transportation issues.      Patient discussed with Dr. Julian Reil MD [facility] Internal Medicine  PGY-1 Pager: [phone]  Phone: [phone] Date 09/19/2023  Time 12:52 PM

## 2023-09-19 NOTE — Patient Instructions (Addendum)
Thank you, [name] for allowing [place] to provide your care today.  Start the following medications: Meds ordered this encounter  Medications   gabapentin (NEURONTIN) 100 MG capsule    Sig: Take 100mg , three times a week only following dialysis.    Dispense:  30 capsule    Refill:  3     Follow up:  At next possible appointment  for A1C / blood pressure. Bring all medications.    Please remember:   [facility] Radiologist in [place], [facility] Address: [address] [place], [place] [address] Phone: [phone]  Dione Booze  [facility] Based [facility] HealthPopulation Health Care Guide  Direct Dial:[phone]  [facility] Phone: [phone] Call them or speak with them regarding seeing your nephrologist and getting your port removed.   Anthony Sar, MD [facility] [address] [place], [place] [address] [phone]   We look forward to seeing you next time. Please call our clinic at [phone] if you have any questions or concerns. The best time to call is Monday-Friday from 9am-4pm, but there is someone available 24/7. If after hours or the weekend, call the main hospital number and ask for the Internal Medicine Resident On-Call. If you need medication refills, please notify your pharmacy one week in advance and they will send [place] a request.   Thank you for trusting me with your care. Wishing you the best!  George Macadamia MD [facility] Internal Medicine Center

## 2023-09-19 NOTE — Assessment & Plan Note (Signed)
Hypertensive as high as 198/104 today.  Patient states have not taken their blood pressure medications today, and states that they were off the blood pressure medications resuming them around last month.  He is unsure exactly which medications he is taking, does not have them in the room with [place] today.  He is prescribed amlodipine 10, Coreg 25, and olmesartan 20.  I have asked the patient to bring all his medications to next visit.  Discussed with the patient the importance of getting his blood pressure under control, and taking his medications consistently.  Plan: Patient to take medications consistently over the next week Patient to bring all medications to next visit Patient asked to schedule with me for soonest available appointment.

## 2023-09-19 NOTE — Assessment & Plan Note (Addendum)
Patient with multiple complex conditions such as ESRD, type 2 diabetes, MS, new neurological abnormalities.  He has had some issues in the past with transportation, but now has a car so should be easier for him to go to his appointments.  He has had issues controlling his blood pressure and with medication management on multiple past visits.  He is following [facility], there have been attempts by the [facility] to reach the patient, but they have been unsuccessful.  Plan: Continue to follow with [facility] Phone number provided for [facility], patient to call them Patient to bring in all their medications next visit Patient to take their blood pressure medications regularly Patient to call the nephrologist, contact information provided Patient to call [facility] radiology, contact information provided Patient was scheduled with multiple MRIs via neurology to be conducted prior to dialysis, patient no showed appointments due to transportation issues.

## 2023-09-19 NOTE — Assessment & Plan Note (Signed)
Euvolemic on exam today, no lower extremity edema, no chest pain, no exertional dyspnea.  Dialysis Monday Wednesday Friday.

## 2023-09-19 NOTE — Assessment & Plan Note (Addendum)
Doing relatively well on status diabetes.  Not on any medications at this time.  Last A1c 6.2, prior A1c's well-controlled as well.  Plan: Foot exam completed today Point-of-care A1c next visit Lipid panel next visit

## 2023-09-19 NOTE — Assessment & Plan Note (Signed)
Patient endorses some ongoing neuropathic pins-and-needles pain in the feet over the past month.  I do see some therapy notes that make mention of this as well.  He does have decree sensation on today's diabetic foot exam.  His A1c has been relatively well-controlled in the past, we will see him in the near future at which time we will recheck his A1c. Plan: Gabapentin 100 daily following dialysis, 3 days a week.

## 2023-09-19 NOTE — Progress Notes (Signed)
Complex Care Management Note Care Guide Note  09/19/2023 Name: [name] MRN: [id number] DOB: 05-Mar-1976  [name] is a 48 y.o. year old male who is a primary care patient of [name], MD . 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 patient had no other concerns]        Care guide performed the following interventions: I'm sending him the link to complete the part A application for access [place]so and will fax his dr the part B to complete .  Follow Up Plan:  No further follow up planned at this time. The patient has been provided with needed resources.  Encounter Outcome:  Patient Visit Completed  Dione Booze  [facility] HealthPopulation Health Care Guide  Direct [phone] [phone] Website: [email or web address]

## 2023-09-20 DIAGNOSIS — I120 Hypertensive chronic kidney disease with stage 5 chronic kidney disease or end stage renal disease: Secondary | ICD-10-CM | POA: Diagnosis not present

## 2023-09-20 DIAGNOSIS — N186 End stage renal disease: Secondary | ICD-10-CM | POA: Diagnosis not present

## 2023-09-20 DIAGNOSIS — Z992 Dependence on renal dialysis: Secondary | ICD-10-CM | POA: Diagnosis not present

## 2023-09-23 NOTE — Progress Notes (Signed)
 Internal Medicine Clinic Attending  Case discussed with the resident at the time of the visit.  We reviewed the resident's history and exam and pertinent patient test results.  I agree with the assessment, diagnosis, and plan of care documented in the resident's note.

## 2023-09-24 ENCOUNTER — Other Ambulatory Visit: Payer: Self-pay

## 2023-09-24 MED ORDER — SEVELAMER CARBONATE 800 MG PO TABS
800.0000 mg | ORAL_TABLET | Freq: Three times a day (TID) | ORAL | 3 refills | Status: DC
  Filled 2023-09-24: qty 90, 30d supply, fill #0

## 2023-09-26 ENCOUNTER — Ambulatory Visit (INDEPENDENT_AMBULATORY_CARE_PROVIDER_SITE_OTHER): Payer: 59 | Admitting: Internal Medicine

## 2023-09-26 VITALS — BP 157/80 | HR 68 | Wt 215.6 lb

## 2023-09-26 DIAGNOSIS — Z23 Encounter for immunization: Secondary | ICD-10-CM | POA: Diagnosis not present

## 2023-09-26 DIAGNOSIS — Z87892 Personal history of anaphylaxis: Secondary | ICD-10-CM

## 2023-09-26 DIAGNOSIS — E1159 Type 2 diabetes mellitus with other circulatory complications: Secondary | ICD-10-CM

## 2023-09-26 DIAGNOSIS — N186 End stage renal disease: Secondary | ICD-10-CM

## 2023-09-26 DIAGNOSIS — E1122 Type 2 diabetes mellitus with diabetic chronic kidney disease: Secondary | ICD-10-CM

## 2023-09-26 DIAGNOSIS — I120 Hypertensive chronic kidney disease with stage 5 chronic kidney disease or end stage renal disease: Secondary | ICD-10-CM | POA: Diagnosis not present

## 2023-09-26 DIAGNOSIS — Z992 Dependence on renal dialysis: Secondary | ICD-10-CM

## 2023-09-26 DIAGNOSIS — Z91018 Allergy to other foods: Secondary | ICD-10-CM

## 2023-09-26 DIAGNOSIS — I10 Essential (primary) hypertension: Secondary | ICD-10-CM

## 2023-09-26 LAB — POCT GLYCOSYLATED HEMOGLOBIN (HGB A1C): Hemoglobin A1C: 5.3 % (ref 4.0–5.6)

## 2023-09-26 LAB — BASIC METABOLIC PANEL
Anion gap: 13 (ref 5–15)
BUN: 49 mg/dL — ABNORMAL HIGH (ref 6–20)
CO2: 22 mmol/L (ref 22–32)
Calcium: 8.7 mg/dL — ABNORMAL LOW (ref 8.9–10.3)
Chloride: 106 mmol/L (ref 98–111)
Creatinine, Ser: 7.15 mg/dL — ABNORMAL HIGH (ref 0.61–1.24)
GFR, Estimated: 9 mL/min — ABNORMAL LOW (ref >60)
Glucose, Bld: 87 mg/dL (ref 70–99)
Potassium: 3.7 mmol/L (ref 3.5–5.1)
Sodium: 141 mmol/L (ref 135–145)

## 2023-09-26 LAB — GLUCOSE, CAPILLARY: Glucose-Capillary: 114 mg/dL — ABNORMAL HIGH (ref 70–99)

## 2023-09-26 NOTE — Progress Notes (Signed)
 Subjective:  CC: Elevated blood pressure  HPI:  [name] is a 48 y.o. male with a past medical history of HFpEF, hypertension, MS, ESRD on HD MWF, and diabetes who presents today for follow-up on blood pressure.   Last office visit was January 30 in which he had severely elevated blood pressure to 198/104.  He had not taken his blood pressure medications that day.  He is prescribed amlodipine  10 mg, carvedilol  25 mg, and olmesartan  20 mg.  He has missed dialysis on Monday and Wednesday of this week as he did not have anyone to look after his 14-year-old daughter.  He has been calling nephrology clinic to try to reschedule make up on Saturday but they do not have any open appointments.  He still makes urine and denies shortness of breath.  Please see problem based assessment and plan for additional details.  Past Medical History:  Diagnosis Date   (HFpEF) heart failure with preserved ejection fraction (HCC)    Acute kidney injury superimposed on chronic kidney disease (HCC)    Acute respiratory distress 02/13/2023   Anemia 1998   Anginal pain (HCC)    Anxiety    Asthma    CKD (chronic kidney disease) stage 5, GFR less than 15 ml/min (HCC)    Coronary artery disease    Diabetes mellitus without complication (HCC) 1995   Hypertension 2019   MI (myocardial infarction) (HCC) 2010   Multiple sclerosis (HCC)    Sleep apnea    Stroke (HCC) 2010    MEDICATIONS:  Carvedilol  25 mg BID Gabapentin  100 mg Trelegy Amlodipine  10 mg Olmesartan  20 mg Famotidine  20 mg Duloxetine  30 mg Atorvastatin  40 mg Aspirin  81 mg Renvela  800 mg 3 times daily  Family History  Problem Relation Age of Onset   Diabetes Mother    Hyperlipidemia Mother    Kidney disease Mother    Drug abuse Father    Diabetes Sister    Hyperlipidemia Sister    Hearing loss Sister    Diabetes Brother    Hyperlipidemia Brother     Past Surgical History:  Procedure Laterality Date   AV FISTULA  PLACEMENT Left 04/24/2023   Procedure: INSERTION OF LEFT ARM ARTERIOVENOUS (AV) GOR-TEX GRAFT;  Surgeon: Magda Debby SAILOR, MD;  Location: [facility] OR;  Service: Vascular;  Laterality: Left;  with regional block   BIOPSY  08/18/2022   Procedure: BIOPSY;  Surgeon: Federico Rosario BROCKS, MD;  Location: [facility] ENDOSCOPY;  Service: Gastroenterology;;   CARDIAC CATHETERIZATION  2010   COLONOSCOPY WITH PROPOFOL  N/A 08/18/2022   Procedure: COLONOSCOPY WITH PROPOFOL ;  Surgeon: Federico Rosario BROCKS, MD;  Location: [facility] ENDOSCOPY;  Service: Gastroenterology;  Laterality: N/A;   ESOPHAGOGASTRODUODENOSCOPY (EGD) WITH PROPOFOL  N/A 08/18/2022   Procedure: ESOPHAGOGASTRODUODENOSCOPY (EGD) WITH PROPOFOL ;  Surgeon: Federico Rosario BROCKS, MD;  Location: [facility] ENDOSCOPY;  Service: Gastroenterology;  Laterality: N/A;   IR FLUORO GUIDE CV LINE RIGHT  03/05/2023   IR US  GUIDE VASC ACCESS RIGHT  03/05/2023   POLYPECTOMY  08/18/2022   Procedure: POLYPECTOMY;  Surgeon: Federico Rosario BROCKS, MD;  Location: [facility] ENDOSCOPY;  Service: Gastroenterology;;     Social History   Socioeconomic History   Marital status: Married    Spouse name: Not on file   Number of children: 4   Years of education: Not on file   Highest education level: 10th grade  Occupational History   Occupation: Hydrologist  Tobacco Use   Smoking status: Former    Current  packs/day: 0.00    Average packs/day: 0.5 packs/day for 31.0 years (15.5 ttl pk-yrs)    Types: Cigars, E-cigarettes, Cigarettes    Start date: 09/1990    Quit date: 09/2021    Years since quitting: 2.0   Smokeless tobacco: Never  Vaping Use   Vaping status: Never Used  Substance and Sexual Activity   Alcohol use: Not Currently    Comment: occasioanlly    Drug use: Yes    Types: Marijuana    Comment: 2 x week   Sexual activity: Yes    Birth control/protection: None  Other Topics Concern   Not on file  Social History Narrative   Not on file   Social Drivers of Health   Financial Resource Strain:  Medium Risk (09/20/2022)   Overall Financial Resource Strain (CARDIA)    Difficulty of Paying Living Expenses: Somewhat hard  Food Insecurity: No Food Insecurity (03/11/2023)   Hunger Vital Sign    Worried About Running Out of Food in the Last Year: Never true    Ran Out of Food in the Last Year: Never true  Transportation Needs: Unmet Transportation Needs (09/18/2023)   PRAPARE - Administrator, Civil Service (Medical): Yes    Lack of Transportation (Non-Medical): Yes  Physical Activity: Not on file  Stress: Not on file  Social Connections: Not on file  Intimate Partner Violence: Not At Risk (03/03/2023)   Humiliation, Afraid, Rape, and Kick questionnaire    Fear of Current or Ex-Partner: No    Emotionally Abused: No    Physically Abused: No    Sexually Abused: No    Review of Systems: ROS negative except for what is noted on the assessment and plan.  Objective:   Vitals:   09/26/23 1526 09/26/23 1559  BP: (!) 161/86 (!) 157/80  Pulse: 77 68  SpO2: 100%   Weight: 215 lb 9.6 oz (97.8 kg)     Physical Exam: Constitutional: well-appearing, in no acute distress Cardiovascular: regular rate and rhythm, TDC in right upper chest, left upper extremity AV fistula with good bruit Pulmonary/Chest: normal work of breathing on room air, lungs clear to auscultation bilaterally MSK: normal bulk and tone Neurological: alert & oriented x 3, normal gait Skin: warm and dry  Assessment & Plan:  ESRD on dialysis [facility]) He has not been to dialysis in days due to needing to find childcare for his daughter.  He is called by the dialysis center to see if there is a make-up session on Saturday but they do not have any sessions to offer.  He is very interested in parenteral dialysis but has not talked with his nephrologist about this.  He receives dialysis through AV fistula.  He still has a tunneled dialysis catheter that has been there since July. P: I messaged his nephrologist Dr. Gearline  to ask about tunneled dialysis catheter and relay message of why he is missing dialysis.  It sounds like he is trying to save money so that they can get his daughter in daycare.  He asked several times that visit why he could not do home dialysis.  Essential hypertension Pressure is uncontrolled at 157/80.  Current medications include carvedilol  25 mg, amlodipine  10 mg and olmesartan  20 mg.  He has pill packets and endorses adherence with this.  With him missing several days of dialysis I do think some of elevated blood pressure is from fluid.  He is not short of breath and there is no lower extremity  edema currently. 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  He is also had anaphylaxis from a bee sting in the past.  He would like to be referred to allergy testing.  He does have 1 EpiPen  at home. P: Referral placed to allergy   Patient discussed with Dr. Jerrell Pagan Jiaire Rosebrook, D.O. [facility] Internal Medicine  PGY-3 Pager: [phone]  Phone: [phone] Date 09/27/2023  Time 1:31 PM

## 2023-09-26 NOTE — Patient Instructions (Addendum)
 Thank you, George Day for allowing us  to provide your care today.   Dialysis It is very dangerous for you to miss dialysis as your kidneys do not work and this is what filters toxins from your blood.  Checking your blood work today and we will call you if you to come back to the emergency room this evening.  I encourage you to go to dialysis tomorrow if at all possible.  Blood pressure Continue taking your medications from your pill packet.  Dialysis also helps control your blood pressure.  Food allergy I am referring you to an allergist to can complete testing for other foods that you are allergic to.  I have ordered the following labs for you:  Lab Orders         Glucose, capillary         BMP w Anion Gap (STAT/Sunquest-performed on-site)         POC Hbg A1C       I have ordered the following medication/changed the following medications:   Stop the following medications: There are no discontinued medications.   Start the following medications: No orders of the defined types were placed in this encounter.    Follow up:  1 month    We look forward to seeing you next time. Please call our clinic at [phone] if you have any questions or concerns. The best time to call is Monday-Friday from 9am-4pm, but there is someone available 24/7. If after hours or the weekend, call the main hospital number and ask for the Internal Medicine Resident On-Call. If you need medication refills, please notify your pharmacy one week in advance and they will send us  a request.   Thank you for trusting me with your care. Wishing you the best!   Izetta Medley, DO [facility] Internal Medicine Center

## 2023-09-27 ENCOUNTER — Encounter: Payer: Self-pay | Admitting: Internal Medicine

## 2023-09-27 DIAGNOSIS — Z87892 Personal history of anaphylaxis: Secondary | ICD-10-CM | POA: Insufficient documentation

## 2023-09-27 NOTE — Assessment & Plan Note (Signed)
 He reports allergy to tomato with swelling.  He is also had anaphylaxis from a bee sting in the past.  He would like to be referred to allergy testing.  He does have 1 EpiPen  at home. P: Referral placed to allergy

## 2023-09-27 NOTE — Assessment & Plan Note (Signed)
 Pressure is uncontrolled at 157/80.  Current medications include carvedilol  25 mg, amlodipine  10 mg and olmesartan  20 mg.  He has pill packets and endorses adherence with this.  With him missing several days of dialysis I do think some of elevated blood pressure is from fluid.  He is not short of breath and there is no lower extremity edema currently. P: Continue Coreg  25 mg, amlodipine  10 mg, and olmesartan  20 mg

## 2023-09-27 NOTE — Assessment & Plan Note (Signed)
 He has not been to dialysis in days due to needing to find childcare for his daughter.  He is called by the dialysis center to see if there is a make-up session on Saturday but they do not have any sessions to offer.  He is very interested in parenteral dialysis but has not talked with his nephrologist about this.  He receives dialysis through AV fistula.  He still has a tunneled dialysis catheter that has been there since July. P: I messaged his nephrologist Dr. Gearline to ask about tunneled dialysis catheter and relay message of why he is missing dialysis.  It sounds like he is trying to save money so that they can get his daughter in daycare.  He asked several times that visit why he could not do home dialysis.

## 2023-09-27 NOTE — Progress Notes (Signed)
 Internal Medicine Clinic Attending  Case discussed with the resident physician at the time of the visit.  We reviewed the patient's history, exam, and pertinent patient test results.  I agree with the assessment, diagnosis, and plan of care documented in the resident's note.

## 2023-10-02 ENCOUNTER — Emergency Department (HOSPITAL_COMMUNITY): Payer: Medicare Other

## 2023-10-02 ENCOUNTER — Other Ambulatory Visit: Payer: Self-pay

## 2023-10-02 ENCOUNTER — Observation Stay (HOSPITAL_COMMUNITY)
Admission: EM | Admit: 2023-10-02 | Discharge: 2023-10-03 | Disposition: A | Discharge status: Home / Self Care | Payer: Medicare Other | Attending: Internal Medicine | Admitting: Internal Medicine

## 2023-10-02 ENCOUNTER — Encounter (HOSPITAL_COMMUNITY): Payer: Self-pay | Admitting: Internal Medicine

## 2023-10-02 DIAGNOSIS — Z87891 Personal history of nicotine dependence: Secondary | ICD-10-CM | POA: Insufficient documentation

## 2023-10-02 DIAGNOSIS — E1122 Type 2 diabetes mellitus with diabetic chronic kidney disease: Secondary | ICD-10-CM | POA: Insufficient documentation

## 2023-10-02 DIAGNOSIS — Z79899 Other long term (current) drug therapy: Secondary | ICD-10-CM | POA: Insufficient documentation

## 2023-10-02 DIAGNOSIS — K922 Gastrointestinal hemorrhage, unspecified: Principal | ICD-10-CM

## 2023-10-02 DIAGNOSIS — E669 Obesity, unspecified: Secondary | ICD-10-CM | POA: Insufficient documentation

## 2023-10-02 DIAGNOSIS — I5032 Chronic diastolic (congestive) heart failure: Secondary | ICD-10-CM | POA: Insufficient documentation

## 2023-10-02 DIAGNOSIS — K920 Hematemesis: Secondary | ICD-10-CM | POA: Diagnosis present

## 2023-10-02 DIAGNOSIS — R0989 Other specified symptoms and signs involving the circulatory and respiratory systems: Secondary | ICD-10-CM

## 2023-10-02 DIAGNOSIS — E66811 Obesity, class 1: Secondary | ICD-10-CM

## 2023-10-02 DIAGNOSIS — I2510 Atherosclerotic heart disease of native coronary artery without angina pectoris: Secondary | ICD-10-CM | POA: Diagnosis present

## 2023-10-02 DIAGNOSIS — Z992 Dependence on renal dialysis: Secondary | ICD-10-CM | POA: Insufficient documentation

## 2023-10-02 DIAGNOSIS — Z8673 Personal history of transient ischemic attack (TIA), and cerebral infarction without residual deficits: Secondary | ICD-10-CM | POA: Insufficient documentation

## 2023-10-02 DIAGNOSIS — I5030 Unspecified diastolic (congestive) heart failure: Secondary | ICD-10-CM | POA: Diagnosis present

## 2023-10-02 DIAGNOSIS — Z6832 Body mass index (BMI) 32.0-32.9, adult: Secondary | ICD-10-CM | POA: Insufficient documentation

## 2023-10-02 DIAGNOSIS — Z7982 Long term (current) use of aspirin: Secondary | ICD-10-CM | POA: Diagnosis not present

## 2023-10-02 DIAGNOSIS — I10 Essential (primary) hypertension: Secondary | ICD-10-CM | POA: Diagnosis present

## 2023-10-02 DIAGNOSIS — I132 Hypertensive heart and chronic kidney disease with heart failure and with stage 5 chronic kidney disease, or end stage renal disease: Secondary | ICD-10-CM | POA: Diagnosis not present

## 2023-10-02 DIAGNOSIS — K2980 Duodenitis without bleeding: Secondary | ICD-10-CM | POA: Diagnosis not present

## 2023-10-02 DIAGNOSIS — N186 End stage renal disease: Secondary | ICD-10-CM

## 2023-10-02 DIAGNOSIS — J45909 Unspecified asthma, uncomplicated: Secondary | ICD-10-CM | POA: Insufficient documentation

## 2023-10-02 LAB — CBC WITH DIFFERENTIAL/PLATELET
Abs Immature Granulocytes: 0.01 10*3/uL (ref 0.00–0.07)
Basophils Absolute: 0 10*3/uL (ref 0.0–0.1)
Basophils Relative: 0 %
Eosinophils Absolute: 0.2 10*3/uL (ref 0.0–0.5)
Eosinophils Relative: 3 %
HCT: 33.6 % — ABNORMAL LOW (ref 39.0–52.0)
Hemoglobin: 10.8 g/dL — ABNORMAL LOW (ref 13.0–17.0)
Immature Granulocytes: 0 %
Lymphocytes Relative: 17 %
Lymphs Abs: 0.8 10*3/uL (ref 0.7–4.0)
MCH: 24.5 pg — ABNORMAL LOW (ref 26.0–34.0)
MCHC: 32.1 g/dL (ref 30.0–36.0)
MCV: 76.4 fL — ABNORMAL LOW (ref 80.0–100.0)
Monocytes Absolute: 0.3 10*3/uL (ref 0.1–1.0)
Monocytes Relative: 7 %
Neutro Abs: 3.3 10*3/uL (ref 1.7–7.7)
Neutrophils Relative %: 73 %
Platelets: 171 10*3/uL (ref 150–400)
RBC: 4.4 MIL/uL (ref 4.22–5.81)
RDW: 15.9 % — ABNORMAL HIGH (ref 11.5–15.5)
WBC: 4.6 10*3/uL (ref 4.0–10.5)
nRBC: 0 % (ref 0.0–0.2)

## 2023-10-02 LAB — IRON AND TIBC
Iron: 60 ug/dL (ref 45–182)
Saturation Ratios: 25 % (ref 17.9–39.5)
TIBC: 241 ug/dL — ABNORMAL LOW (ref 250–450)
UIBC: 181 ug/dL

## 2023-10-02 LAB — COMPREHENSIVE METABOLIC PANEL
ALT: 19 U/L (ref 0–44)
AST: 20 U/L (ref 15–41)
Albumin: 3.5 g/dL (ref 3.5–5.0)
Alkaline Phosphatase: 78 U/L (ref 38–126)
Anion gap: 10 (ref 5–15)
BUN: 54 mg/dL — ABNORMAL HIGH (ref 6–20)
CO2: 16 mmol/L — ABNORMAL LOW (ref 22–32)
Calcium: 8.7 mg/dL — ABNORMAL LOW (ref 8.9–10.3)
Chloride: 111 mmol/L (ref 98–111)
Creatinine, Ser: 6.49 mg/dL — ABNORMAL HIGH (ref 0.61–1.24)
GFR, Estimated: 10 mL/min — ABNORMAL LOW (ref >60)
Glucose, Bld: 83 mg/dL (ref 70–99)
Potassium: 4.8 mmol/L (ref 3.5–5.1)
Sodium: 137 mmol/L (ref 135–145)
Total Bilirubin: 0.8 mg/dL (ref 0.0–1.2)
Total Protein: 7 g/dL (ref 6.5–8.1)

## 2023-10-02 LAB — HEPATITIS B SURFACE ANTIGEN: Hepatitis B Surface Ag: NONREACTIVE

## 2023-10-02 LAB — TYPE AND SCREEN
ABO/RH(D): O NEG
Antibody Screen: NEGATIVE

## 2023-10-02 LAB — PROTIME-INR
INR: 1.1 (ref 0.8–1.2)
Prothrombin Time: 14 s (ref 11.4–15.2)

## 2023-10-02 LAB — FERRITIN: Ferritin: 458 ng/mL — ABNORMAL HIGH (ref 24–336)

## 2023-10-02 MED ORDER — CHLORHEXIDINE GLUCONATE CLOTH 2 % EX PADS
6.0000 | MEDICATED_PAD | Freq: Every day | CUTANEOUS | Status: DC
  Administered 2023-10-03: 6 via TOPICAL

## 2023-10-02 MED ORDER — CLONIDINE HCL 0.1 MG PO TABS
0.1000 mg | ORAL_TABLET | Freq: Once | ORAL | Status: AC
  Administered 2023-10-02: 0.1 mg via ORAL
  Filled 2023-10-02 (×3): qty 1

## 2023-10-02 MED ORDER — DOXERCALCIFEROL 4 MCG/2ML IV SOLN: 6.0000 ug | INTRAVENOUS | Status: DC

## 2023-10-02 MED ORDER — HEPARIN SODIUM (PORCINE) 1000 UNIT/ML DIALYSIS
1000.0000 [IU] | INTRAMUSCULAR | Status: DC | PRN
  Administered 2023-10-02: 3800 [IU] via INTRAVENOUS_CENTRAL

## 2023-10-02 MED ORDER — IRBESARTAN 75 MG PO TABS
150.0000 mg | ORAL_TABLET | Freq: Every day | ORAL | Status: DC
  Administered 2023-10-02 – 2023-10-03 (×2): 150 mg via ORAL
  Filled 2023-10-02: qty 2
  Filled 2023-10-02: qty 1
  Filled 2023-10-02: qty 2
  Filled 2023-10-02: qty 1

## 2023-10-02 MED ORDER — CARVEDILOL 25 MG PO TABS
50.0000 mg | ORAL_TABLET | Freq: Two times a day (BID) | ORAL | Status: DC
Start: 2023-10-02 — End: 2023-10-03
  Administered 2023-10-02 – 2023-10-03 (×2): 50 mg via ORAL
  Filled 2023-10-02 (×2): qty 2

## 2023-10-02 MED ORDER — PANTOPRAZOLE SODIUM 40 MG IV SOLR
40.0000 mg | Freq: Once | INTRAVENOUS | Status: AC
  Administered 2023-10-02: 40 mg via INTRAVENOUS
  Filled 2023-10-02: qty 10

## 2023-10-02 MED ORDER — ONDANSETRON HCL 4 MG/2ML IJ SOLN
4.0000 mg | Freq: Once | INTRAMUSCULAR | Status: AC
  Administered 2023-10-02: 4 mg via INTRAVENOUS
  Filled 2023-10-02: qty 2

## 2023-10-02 MED ORDER — ATORVASTATIN CALCIUM 40 MG PO TABS
40.0000 mg | ORAL_TABLET | Freq: Every day | ORAL | Status: DC
  Administered 2023-10-02 – 2023-10-03 (×2): 40 mg via ORAL
  Filled 2023-10-02 (×2): qty 1

## 2023-10-02 MED ORDER — PANTOPRAZOLE SODIUM 40 MG IV SOLR
40.0000 mg | Freq: Two times a day (BID) | INTRAVENOUS | Status: DC
  Administered 2023-10-02 – 2023-10-03 (×2): 40 mg via INTRAVENOUS
  Filled 2023-10-02 (×3): qty 10

## 2023-10-02 MED ORDER — UMECLIDINIUM BROMIDE 62.5 MCG/ACT IN AEPB
1.0000 | INHALATION_SPRAY | Freq: Every day | RESPIRATORY_TRACT | Status: DC
  Administered 2023-10-03: 1 via RESPIRATORY_TRACT
  Filled 2023-10-02: qty 7

## 2023-10-02 MED ORDER — HEPARIN SODIUM (PORCINE) 1000 UNIT/ML IJ SOLN
INTRAMUSCULAR | Status: AC
  Filled 2023-10-02: qty 4

## 2023-10-02 MED ORDER — LIDOCAINE 5 % EX PTCH
1.0000 | MEDICATED_PATCH | CUTANEOUS | Status: DC
  Administered 2023-10-02: 1 via TRANSDERMAL
  Filled 2023-10-02: qty 1

## 2023-10-02 MED ORDER — AMLODIPINE BESYLATE 10 MG PO TABS
10.0000 mg | ORAL_TABLET | Freq: Every day | ORAL | Status: DC
  Administered 2023-10-03: 10 mg via ORAL
  Filled 2023-10-02: qty 1

## 2023-10-02 MED ORDER — ONDANSETRON HCL 4 MG/2ML IJ SOLN: 4.0000 mg | Freq: Four times a day (QID) | INTRAMUSCULAR | Status: DC | PRN

## 2023-10-02 MED ORDER — FLUTICASONE FUROATE-VILANTEROL 100-25 MCG/ACT IN AEPB
1.0000 | INHALATION_SPRAY | Freq: Every day | RESPIRATORY_TRACT | Status: DC
  Administered 2023-10-03: 1 via RESPIRATORY_TRACT
  Filled 2023-10-02: qty 28

## 2023-10-02 MED ORDER — SEVELAMER CARBONATE 800 MG PO TABS
800.0000 mg | ORAL_TABLET | Freq: Three times a day (TID) | ORAL | Status: DC
  Administered 2023-10-02 – 2023-10-03 (×2): 800 mg via ORAL
  Filled 2023-10-02 (×2): qty 1

## 2023-10-02 NOTE — Progress Notes (Signed)
   10/02/23 1720  Vitals  Temp 98.5 F (36.9 C)  Pulse Rate 78  Resp 16  BP (!) 198/99  SpO2 98 %  O2 Device Room Air  Post Treatment  Dialyzer Clearance Lightly streaked  Liters Processed 68.2  Fluid Removed (mL) 2000 mL   Received patient in bed to unit.  Alert and oriented.  Informed consent signed and in chart.   TX duration:3.5  Patient hypertensive pre/intra/post HD, patient denies any discomfort while on HD or post HD. Dr. Marisue Humble notified at approximately 1543 and gave new order to increase UFG to 3500 ml. Patient c/o of cramping after increasing UFG 3500 ml, ufg was lower to 2000 ml Dr. Marisue Humble notified and new order to give Clonidine 0.1 mg. Unable to remove med from HD pyris, requested medication from pharmacy. Transported back to the room with this nurse and transporter. Primary Nurse made aware. Alert, without acute distress and patient denies any discomfort.  Hand-off given to patient's nurse at bedside.   Access used: R CVC Access issues: None  Total UF removed: Medication(s) given: See MAR  Laqueta Due, RN [facility]

## 2023-10-02 NOTE — Consult Note (Signed)
Consultation  Referring Provider: Medicine service/Lau Primary Care Physician:  [name], MD Primary Gastroenterologist:  Leonides Schanz  Reason for Consultation: Hematemesis  HPI: George Day is a 48 y.o. male with history of end-stage renal disease on dialysis, hypertension, congestive heart failure with preserved EF and diabetes mellitus. Patient is known to our GI service/Dr. Leonides Schanz and had undergone workup in December 2023 for iron deficiency anemia.  He had EGD that was negative other than mild duodenitis, and colonoscopy negative other than nonbleeding internal hemorrhoids and one 3 mm polyp was removed from the cecum.  Oestreich biopsies were negative for H. pylori, benign duodenal mucosa, cecal polyp with mild hyperplastic/serrated change suggestive of sessile serrated lesion..  Patient came to the ER this morning after he had 2 episodes of dark red emesis this morning.  He thinks he may have vomited up about a half a cup of blood each time.  He did notice that his stool was black today as well, and may have been black last evening.  Did not had any nausea and vomiting prior to this morning. He had missed several dialysis sessions over the past couple of weeks due to issues with childcare and therefore unable to get to dialysis.  He says he usually does not get nauseated when he misses dialysis.  He has had some recent heartburn and indigestion, no dysphagia or dyne aphasia, no complaints of abdominal pain. He is not on any blood thinners, is on a baby aspirin daily.  Labs in the [facility] 4.6/hemoglobin 10.8/hematocrit 33.6 (about at his baseline) MCV 76/platelets are 171 INR 1.1 Iron studies pending  Potassium 4.8/glucose 83/BUN 54/creatinine 6.49 LFTs within normal limits  Patient has been hemodynamically stable, currently on dialysis.  Feeling fine .   Past Medical History:  Diagnosis Date   (HFpEF) heart failure with preserved ejection fraction (HCC)    Acute kidney  injury superimposed on chronic kidney disease (HCC)    Acute respiratory distress 02/13/2023   Anemia 1998   Anginal pain (HCC)    Anxiety    Asthma    CKD (chronic kidney disease) stage 5, GFR less than 15 ml/min (HCC)    Coronary artery disease    Diabetes mellitus without complication (HCC) 1995   Hypertension 2019   MI (myocardial infarction) (HCC) 2010   Multiple sclerosis (HCC)    Sleep apnea    Stroke (HCC) 2010    Past Surgical History:  Procedure Laterality Date   AV FISTULA PLACEMENT Left 04/24/2023   Procedure: INSERTION OF LEFT ARM ARTERIOVENOUS (AV) GOR-TEX GRAFT;  Surgeon: Leonie Douglas, MD;  Location: [facility] OR;  Service: Vascular;  Laterality: Left;  with regional block   BIOPSY  08/18/2022   Procedure: BIOPSY;  Surgeon: Imogene Burn, MD;  Location: [facility] ENDOSCOPY;  Service: Gastroenterology;;   CARDIAC CATHETERIZATION  2010   COLONOSCOPY WITH PROPOFOL N/A 08/18/2022   Procedure: COLONOSCOPY WITH PROPOFOL;  Surgeon: Imogene Burn, MD;  Location: [facility] ENDOSCOPY;  Service: Gastroenterology;  Laterality: N/A;   ESOPHAGOGASTRODUODENOSCOPY (EGD) WITH PROPOFOL N/A 08/18/2022   Procedure: ESOPHAGOGASTRODUODENOSCOPY (EGD) WITH PROPOFOL;  Surgeon: Imogene Burn, MD;  Location: [facility] ENDOSCOPY;  Service: Gastroenterology;  Laterality: N/A;   IR FLUORO GUIDE CV LINE RIGHT  03/05/2023   IR US GUIDE VASC ACCESS RIGHT  03/05/2023   POLYPECTOMY  08/18/2022   Procedure: POLYPECTOMY;  Surgeon: Imogene Burn, MD;  Location: [facility] ENDOSCOPY;  Service: Gastroenterology;;    Prior to Admission medications  Medication Sig Start Date End Date Taking? Authorizing Provider  amLODipine (NORVASC) 10 MG tablet TAKE 1 TABLET BY MOUTH DAILY 06/25/23  Yes [name], MD  aspirin EC 81 MG tablet Take 1 tablet (81 mg total) by mouth daily. 11/21/22  Yes Ngetich, Dinah C, NP  atorvastatin (LIPITOR) 40 MG tablet TAKE 1 TABLET BY MOUTH DAILY 06/25/23  Yes [name], MD  carvedilol (COREG) 25 MG  tablet TAKE TWO (2) TABLETS BY MOUTH TWICE DAILY 06/25/23  Yes [name], MD  EPINEPHrine 0.3 mg/0.3 mL IJ SOAJ injection Inject 0.3 mg into the muscle as needed for anaphylaxis.   Yes [provider]  nitroGLYCERIN (NITROSTAT) 0.6 MG SL tablet Place 1 tablet (0.6 mg total) under the tongue every 5 (five) minutes as needed for chest pain. 11/21/22  Yes Ngetich, Dinah C, NP  olmesartan (BENICAR) 20 MG tablet Take 1 tablet (20 mg total) by mouth at bedtime for high blood pressure. 04/04/23  Yes   TRELEGY ELLIPTA 100-62.5-25 MCG/ACT AEPB INHALE ONE (1) PUFF BY MOUTH DAILY 09/10/23  Yes [name], MD  DULoxetine (CYMBALTA) 30 MG capsule Take 1 capsule (30 mg total) by mouth daily. Patient not taking: Reported on 10/02/2023 07/24/23   Levert Feinstein, MD  gabapentin (NEURONTIN) 100 MG capsule Take 100mg , three times a week only following dialysis. Patient not taking: Reported on 10/02/2023 09/19/23   [name], MD  sevelamer carbonate (RENVELA) 800 MG tablet Take 1 tablet (800 mg total) by mouth 3 (three) times daily with meals. Patient not taking: Reported on 10/02/2023 04/08/23     sevelamer carbonate (RENVELA) 800 MG tablet Take 1 tablet (800 mg total) by mouth 3 (three) times daily. Patient not taking: Reported on 10/02/2023 09/24/23       Current Facility-Administered Medications  Medication Dose Route Frequency Provider Last Rate Last Admin   Chlorhexidine Gluconate Cloth 2 % PADS 6 each  6 each Topical Q0600 Berenda Morale, NP       Melene Muller ON 10/04/2023] doxercalciferol (HECTOROL) injection 6 mcg  6 mcg Intravenous Q M,W,F-HD Arita Miss, MD       lidocaine (LIDODERM) 5 % 1 patch  1 patch Transdermal Q24H Bender, Emily, DO       ondansetron (ZOFRAN) injection 4 mg  4 mg Intravenous Q6H PRN Malee Grays S, PA-C       pantoprazole (PROTONIX) injection 40 mg  40 mg Intravenous Q12H Bender, Emily, DO       sevelamer carbonate (RENVELA) tablet 800 mg  800 mg Oral TID WC  Arita Miss, MD        Allergies as of 10/02/2023 - Review Complete 10/02/2023  Allergen Reaction Noted   Honey bee venom [bee venom] Anaphylaxis 08/17/2022   Penicillins Anaphylaxis 01/05/2020   Tomato Swelling 08/08/2020    Family History  Problem Relation Age of Onset   Diabetes Mother    Hyperlipidemia Mother    Kidney disease Mother    Drug abuse Father    Diabetes Sister    Hyperlipidemia Sister    Hearing loss Sister    Diabetes Brother    Hyperlipidemia Brother     Social History   Socioeconomic History   Marital status: Married    Spouse name: Not on file   Number of children: 4   Years of education: Not on file   Highest education level: 10th grade  Occupational History   Occupation: Hydrologist  Tobacco Use   Smoking status: Former  Current packs/day: 0.00    Average packs/day: 0.5 packs/day for 31.0 years (15.5 ttl pk-yrs)    Types: Cigars, E-cigarettes, Cigarettes    Start date: 09/1990    Quit date: 09/2021    Years since quitting: 2.0   Smokeless tobacco: Never  Vaping Use   Vaping status: Never Used  Substance and Sexual Activity   Alcohol use: Not Currently    Comment: occasioanlly    Drug use: Yes    Types: Marijuana    Comment: 2 x week   Sexual activity: Yes    Birth control/protection: None  Other Topics Concern   Not on file  Social History Narrative   Not on file   Social Drivers of Health   Financial Resource Strain: Medium Risk (09/20/2022)   Overall Financial Resource Strain (CARDIA)    Difficulty of Paying Living Expenses: Somewhat hard  Food Insecurity: No Food Insecurity (03/11/2023)   Hunger Vital Sign    Worried About Running Out of Food in the Last Year: Never true    Ran Out of Food in the Last Year: Never true  Transportation Needs: Unmet Transportation Needs (09/18/2023)   PRAPARE - Administrator, Civil Service (Medical): Yes    Lack of Transportation (Non-Medical): Yes  Physical  Activity: Not on file  Stress: Not on file  Social Connections: Not on file  Intimate Partner Violence: Not At Risk (03/03/2023)   Humiliation, Afraid, Rape, and Kick questionnaire    Fear of Current or Ex-Partner: No    Emotionally Abused: No    Physically Abused: No    Sexually Abused: No    Review of Systems: Pertinent positive and negative review of systems were noted in the above HPI section.  All other review of systems was otherwise negative.   Physical Exam: Vital signs in last 24 hours: Temp:  [97.8 F (36.6 C)-98.8 F (37.1 C)] 97.8 F (36.6 C) (02/12 1326) Pulse Rate:  [67-85] 67 (02/12 1546) Resp:  [14-25] 20 (02/12 1546) BP: (171-207)/(97-127) 207/115 (02/12 1546) SpO2:  [98 %-100 %] 100 % (02/12 1546)   General:   Alert,  Well-developed, well-nourished, African-American male pleasant and cooperative in NAD, undergoing dialysis Head:  Normocephalic and atraumatic. Eyes:  Sclera clear, no icterus.   Conjunctiva pink. Ears:  Normal auditory acuity. Nose:  No deformity, discharge,  or lesions. Mouth:  No deformity or lesions.   Neck:  Supple; no masses or thyromegaly. Lungs:  Clear throughout to auscultation.   No wheezes, crackles, or rhonchi.  Heart:  Regular rate and rhythm; no murmurs, clicks, rubs,  or gallops. Abdomen:  Soft,nontender, BS active,nonpalp mass or hsm.   Rectal: Not done Msk:  Symmetrical without gross deformities. . Pulses:  Normal pulses noted. Extremities:  Without clubbing or edema. Neurologic:  Alert and  oriented x4;  grossly normal neurologically. Skin:  Intact without significant lesions or rashes.. Psych:  Alert and cooperative. Normal mood and affect.  Intake/Output from previous day: No intake/output data recorded. Intake/Output this shift: No intake/output data recorded.  Lab Results: Recent Labs    10/02/23 0900  WBC 4.6  HGB 10.8*  HCT 33.6*  PLT 171   BMET Recent Labs    10/02/23 0900  NA 137  K 4.8  CL 111   CO2 16*  GLUCOSE 83  BUN 54*  CREATININE 6.49*  CALCIUM 8.7*   LFT Recent Labs    10/02/23 0900  PROT 7.0  ALBUMIN 3.5  AST 20  ALT 19  ALKPHOS 78  BILITOT 0.8   PT/INR Recent Labs    10/02/23 0900  LABPROT 14.0  INR 1.1   Hepatitis Panel Recent Labs    10/02/23 0958  HEPBSAG NON REACTIVE     IMPRESSION:  #14 48 year old African-American male with end-stage renal disease on hemodialysis who has missed dialysis over the past 10 days. Onset nausea ,vomiting this morning.  2 episodes of dark red hematemesis  He has been hemodynamically stable, hemoglobin currently about at his baseline. No further hematemesis since arrival, did note black stool this morning as well  Rule out acute esophagitis, Mallory-Weiss tear, peptic ulcer disease  #2 history of iron deficiency anemia-previous workup with EGD and colonoscopy December 2023 negative other than mild duodenitis and nonbleeding internal hemorrhoids, did have 1 diminutive sessile serrated polyp.  #3 hypertension #4 congestive heart failure with preserved EF #5 MS #6 coronary artery disease status post MI  Plan;  Full liquid diet for dinner, n.p.o. after midnight IV Zofran every 6 hours as needed IV PPI twice daily Trend hemoglobin and transfuse as indicated for hemoglobin less than 7 We will schedule for EGD in a.m. with Dr. Rhea Belton.  Procedure was discussed in detail with the patient including indications risks and benefits and he is agreeable to proceed. Further recommendations pending findings at EGD.      Suzannah Bettes EsterwoodPA-C  10/02/2023, 3:53 PM

## 2023-10-02 NOTE — H&P (View-Only) (Signed)
Consultation  Referring Provider: Medicine service/Lau Primary Care Physician:  [name], MD Primary Gastroenterologist:  Leonides Schanz  Reason for Consultation: Hematemesis  HPI: George Day is a 48 y.o. male with history of end-stage renal disease on dialysis, hypertension, congestive heart failure with preserved EF and diabetes mellitus. Patient is known to our GI service/Dr. Leonides Schanz and had undergone workup in December 2023 for iron deficiency anemia.  He had EGD that was negative other than mild duodenitis, and colonoscopy negative other than nonbleeding internal hemorrhoids and one 3 mm polyp was removed from the cecum.  Oestreich biopsies were negative for H. pylori, benign duodenal mucosa, cecal polyp with mild hyperplastic/serrated change suggestive of sessile serrated lesion..  Patient came to the ER this morning after he had 2 episodes of dark red emesis this morning.  He thinks he may have vomited up about a half a cup of blood each time.  He did notice that his stool was black today as well, and may have been black last evening.  Did not had any nausea and vomiting prior to this morning. He had missed several dialysis sessions over the past couple of weeks due to issues with childcare and therefore unable to get to dialysis.  He says he usually does not get nauseated when he misses dialysis.  He has had some recent heartburn and indigestion, no dysphagia or dyne aphasia, no complaints of abdominal pain. He is not on any blood thinners, is on a baby aspirin daily.  Labs in the [facility] 4.6/hemoglobin 10.8/hematocrit 33.6 (about at his baseline) MCV 76/platelets are 171 INR 1.1 Iron studies pending  Potassium 4.8/glucose 83/BUN 54/creatinine 6.49 LFTs within normal limits  Patient has been hemodynamically stable, currently on dialysis.  Feeling fine .   Past Medical History:  Diagnosis Date   (HFpEF) heart failure with preserved ejection fraction (HCC)    Acute kidney  injury superimposed on chronic kidney disease (HCC)    Acute respiratory distress 02/13/2023   Anemia 1998   Anginal pain (HCC)    Anxiety    Asthma    CKD (chronic kidney disease) stage 5, GFR less than 15 ml/min (HCC)    Coronary artery disease    Diabetes mellitus without complication (HCC) 1995   Hypertension 2019   MI (myocardial infarction) (HCC) 2010   Multiple sclerosis (HCC)    Sleep apnea    Stroke (HCC) 2010    Past Surgical History:  Procedure Laterality Date   AV FISTULA PLACEMENT Left 04/24/2023   Procedure: INSERTION OF LEFT ARM ARTERIOVENOUS (AV) GOR-TEX GRAFT;  Surgeon: Leonie Douglas, MD;  Location: [facility] OR;  Service: Vascular;  Laterality: Left;  with regional block   BIOPSY  08/18/2022   Procedure: BIOPSY;  Surgeon: Imogene Burn, MD;  Location: [facility] ENDOSCOPY;  Service: Gastroenterology;;   CARDIAC CATHETERIZATION  2010   COLONOSCOPY WITH PROPOFOL N/A 08/18/2022   Procedure: COLONOSCOPY WITH PROPOFOL;  Surgeon: Imogene Burn, MD;  Location: [facility] ENDOSCOPY;  Service: Gastroenterology;  Laterality: N/A;   ESOPHAGOGASTRODUODENOSCOPY (EGD) WITH PROPOFOL N/A 08/18/2022   Procedure: ESOPHAGOGASTRODUODENOSCOPY (EGD) WITH PROPOFOL;  Surgeon: Imogene Burn, MD;  Location: [facility] ENDOSCOPY;  Service: Gastroenterology;  Laterality: N/A;   IR FLUORO GUIDE CV LINE RIGHT  03/05/2023   IR US GUIDE VASC ACCESS RIGHT  03/05/2023   POLYPECTOMY  08/18/2022   Procedure: POLYPECTOMY;  Surgeon: Imogene Burn, MD;  Location: [facility] ENDOSCOPY;  Service: Gastroenterology;;    Prior to Admission medications  Medication Sig Start Date End Date Taking? Authorizing Provider  amLODipine (NORVASC) 10 MG tablet TAKE 1 TABLET BY MOUTH DAILY 06/25/23  Yes [name], MD  aspirin EC 81 MG tablet Take 1 tablet (81 mg total) by mouth daily. 11/21/22  Yes Ngetich, Dinah C, NP  atorvastatin (LIPITOR) 40 MG tablet TAKE 1 TABLET BY MOUTH DAILY 06/25/23  Yes [name], MD  carvedilol (COREG) 25 MG  tablet TAKE TWO (2) TABLETS BY MOUTH TWICE DAILY 06/25/23  Yes [name], MD  EPINEPHrine 0.3 mg/0.3 mL IJ SOAJ injection Inject 0.3 mg into the muscle as needed for anaphylaxis.   Yes [provider]  nitroGLYCERIN (NITROSTAT) 0.6 MG SL tablet Place 1 tablet (0.6 mg total) under the tongue every 5 (five) minutes as needed for chest pain. 11/21/22  Yes Ngetich, Dinah C, NP  olmesartan (BENICAR) 20 MG tablet Take 1 tablet (20 mg total) by mouth at bedtime for high blood pressure. 04/04/23  Yes   TRELEGY ELLIPTA 100-62.5-25 MCG/ACT AEPB INHALE ONE (1) PUFF BY MOUTH DAILY 09/10/23  Yes [name], MD  DULoxetine (CYMBALTA) 30 MG capsule Take 1 capsule (30 mg total) by mouth daily. Patient not taking: Reported on 10/02/2023 07/24/23   Levert Feinstein, MD  gabapentin (NEURONTIN) 100 MG capsule Take 100mg , three times a week only following dialysis. Patient not taking: Reported on 10/02/2023 09/19/23   [name], MD  sevelamer carbonate (RENVELA) 800 MG tablet Take 1 tablet (800 mg total) by mouth 3 (three) times daily with meals. Patient not taking: Reported on 10/02/2023 04/08/23     sevelamer carbonate (RENVELA) 800 MG tablet Take 1 tablet (800 mg total) by mouth 3 (three) times daily. Patient not taking: Reported on 10/02/2023 09/24/23       Current Facility-Administered Medications  Medication Dose Route Frequency Provider Last Rate Last Admin   Chlorhexidine Gluconate Cloth 2 % PADS 6 each  6 each Topical Q0600 Berenda Morale, NP       Melene Muller ON 10/04/2023] doxercalciferol (HECTOROL) injection 6 mcg  6 mcg Intravenous Q M,W,F-HD Arita Miss, MD       lidocaine (LIDODERM) 5 % 1 patch  1 patch Transdermal Q24H Bender, Emily, DO       ondansetron (ZOFRAN) injection 4 mg  4 mg Intravenous Q6H PRN Malee Grays S, PA-C       pantoprazole (PROTONIX) injection 40 mg  40 mg Intravenous Q12H Bender, Emily, DO       sevelamer carbonate (RENVELA) tablet 800 mg  800 mg Oral TID WC  Arita Miss, MD        Allergies as of 10/02/2023 - Review Complete 10/02/2023  Allergen Reaction Noted   Honey bee venom [bee venom] Anaphylaxis 08/17/2022   Penicillins Anaphylaxis 01/05/2020   Tomato Swelling 08/08/2020    Family History  Problem Relation Age of Onset   Diabetes Mother    Hyperlipidemia Mother    Kidney disease Mother    Drug abuse Father    Diabetes Sister    Hyperlipidemia Sister    Hearing loss Sister    Diabetes Brother    Hyperlipidemia Brother     Social History   Socioeconomic History   Marital status: Married    Spouse name: Not on file   Number of children: 4   Years of education: Not on file   Highest education level: 10th grade  Occupational History   Occupation: Hydrologist  Tobacco Use   Smoking status: Former  Current packs/day: 0.00    Average packs/day: 0.5 packs/day for 31.0 years (15.5 ttl pk-yrs)    Types: Cigars, E-cigarettes, Cigarettes    Start date: 09/1990    Quit date: 09/2021    Years since quitting: 2.0   Smokeless tobacco: Never  Vaping Use   Vaping status: Never Used  Substance and Sexual Activity   Alcohol use: Not Currently    Comment: occasioanlly    Drug use: Yes    Types: Marijuana    Comment: 2 x week   Sexual activity: Yes    Birth control/protection: None  Other Topics Concern   Not on file  Social History Narrative   Not on file   Social Drivers of Health   Financial Resource Strain: Medium Risk (09/20/2022)   Overall Financial Resource Strain (CARDIA)    Difficulty of Paying Living Expenses: Somewhat hard  Food Insecurity: No Food Insecurity (03/11/2023)   Hunger Vital Sign    Worried About Running Out of Food in the Last Year: Never true    Ran Out of Food in the Last Year: Never true  Transportation Needs: Unmet Transportation Needs (09/18/2023)   PRAPARE - Administrator, Civil Service (Medical): Yes    Lack of Transportation (Non-Medical): Yes  Physical  Activity: Not on file  Stress: Not on file  Social Connections: Not on file  Intimate Partner Violence: Not At Risk (03/03/2023)   Humiliation, Afraid, Rape, and Kick questionnaire    Fear of Current or Ex-Partner: No    Emotionally Abused: No    Physically Abused: No    Sexually Abused: No    Review of Systems: Pertinent positive and negative review of systems were noted in the above HPI section.  All other review of systems was otherwise negative.   Physical Exam: Vital signs in last 24 hours: Temp:  [97.8 F (36.6 C)-98.8 F (37.1 C)] 97.8 F (36.6 C) (02/12 1326) Pulse Rate:  [67-85] 67 (02/12 1546) Resp:  [14-25] 20 (02/12 1546) BP: (171-207)/(97-127) 207/115 (02/12 1546) SpO2:  [98 %-100 %] 100 % (02/12 1546)   General:   Alert,  Well-developed, well-nourished, African-American male pleasant and cooperative in NAD, undergoing dialysis Head:  Normocephalic and atraumatic. Eyes:  Sclera clear, no icterus.   Conjunctiva pink. Ears:  Normal auditory acuity. Nose:  No deformity, discharge,  or lesions. Mouth:  No deformity or lesions.   Neck:  Supple; no masses or thyromegaly. Lungs:  Clear throughout to auscultation.   No wheezes, crackles, or rhonchi.  Heart:  Regular rate and rhythm; no murmurs, clicks, rubs,  or gallops. Abdomen:  Soft,nontender, BS active,nonpalp mass or hsm.   Rectal: Not done Msk:  Symmetrical without gross deformities. . Pulses:  Normal pulses noted. Extremities:  Without clubbing or edema. Neurologic:  Alert and  oriented x4;  grossly normal neurologically. Skin:  Intact without significant lesions or rashes.. Psych:  Alert and cooperative. Normal mood and affect.  Intake/Output from previous day: No intake/output data recorded. Intake/Output this shift: No intake/output data recorded.  Lab Results: Recent Labs    10/02/23 0900  WBC 4.6  HGB 10.8*  HCT 33.6*  PLT 171   BMET Recent Labs    10/02/23 0900  NA 137  K 4.8  CL 111   CO2 16*  GLUCOSE 83  BUN 54*  CREATININE 6.49*  CALCIUM 8.7*   LFT Recent Labs    10/02/23 0900  PROT 7.0  ALBUMIN 3.5  AST 20  ALT 19  ALKPHOS 78  BILITOT 0.8   PT/INR Recent Labs    10/02/23 0900  LABPROT 14.0  INR 1.1   Hepatitis Panel Recent Labs    10/02/23 0958  HEPBSAG NON REACTIVE     IMPRESSION:  #14 48 year old African-American male with end-stage renal disease on hemodialysis who has missed dialysis over the past 10 days. Onset nausea ,vomiting this morning.  2 episodes of dark red hematemesis  He has been hemodynamically stable, hemoglobin currently about at his baseline. No further hematemesis since arrival, did note black stool this morning as well  Rule out acute esophagitis, Mallory-Weiss tear, peptic ulcer disease  #2 history of iron deficiency anemia-previous workup with EGD and colonoscopy December 2023 negative other than mild duodenitis and nonbleeding internal hemorrhoids, did have 1 diminutive sessile serrated polyp.  #3 hypertension #4 congestive heart failure with preserved EF #5 MS #6 coronary artery disease status post MI  Plan;  Full liquid diet for dinner, n.p.o. after midnight IV Zofran every 6 hours as needed IV PPI twice daily Trend hemoglobin and transfuse as indicated for hemoglobin less than 7 We will schedule for EGD in a.m. with Dr. Rhea Belton.  Procedure was discussed in detail with the patient including indications risks and benefits and he is agreeable to proceed. Further recommendations pending findings at EGD.      Suzannah Bettes EsterwoodPA-C  10/02/2023, 3:53 PM

## 2023-10-02 NOTE — Anesthesia Preprocedure Evaluation (Signed)
Anesthesia Evaluation  Patient identified by MRN, date of birth, ID band Patient awake    Reviewed: Allergy & Precautions, NPO status , Patient's Chart, lab work & pertinent test results  Airway Mallampati: I  TM Distance: >3 FB Neck ROM: Full    Dental no notable dental hx. (+) Teeth Intact, Dental Advisory Given   Pulmonary sleep apnea and Continuous Positive Airway Pressure Ventilation , Patient abstained from smoking., former smoker   Pulmonary exam normal breath sounds clear to auscultation       Cardiovascular hypertension, +CHF (wpEF)  Normal cardiovascular exam Rhythm:Regular Rate:Normal  01/2023 echo 1. Left ventricular ejection fraction, by estimation, is 50 to 55%. The  left ventricle has low normal function. The left ventricle has no regional  wall motion abnormalities. There is severe concentric left ventricular  hypertrophy. Left ventricular  diastolic parameters are consistent with Grade III diastolic dysfunction  (restrictive).     Neuro/Psych MS    GI/Hepatic   Endo/Other  diabetes    Renal/GU ESRF and DialysisRenal diseaseMWF     Musculoskeletal   Abdominal   Peds  Hematology   Anesthesia Other Findings   Reproductive/Obstetrics                             Anesthesia Physical Anesthesia Plan  ASA: 4  Anesthesia Plan: Regional   Post-op Pain Management:    Induction:   PONV Risk Score and Plan: 1 and Propofol infusion and Treatment may vary due to age or medical condition  Airway Management Planned: Natural Airway and Simple Face Mask  Additional Equipment: None  Intra-op Plan:   Post-operative Plan:   Informed Consent: I have reviewed the patients History and Physical, chart, labs and discussed the procedure including the risks, benefits and alternatives for the proposed anesthesia with the patient or authorized representative who has indicated his/her  understanding and acceptance.     Dental advisory given  Plan Discussed with: CRNA and Anesthesiologist  Anesthesia Plan Comments: (EGD for anemia)       Anesthesia Quick Evaluation

## 2023-10-02 NOTE — ED Provider Notes (Signed)
[place] EMERGENCY DEPARTMENT AT [facility] Provider Note   CSN: [id number] Arrival date & time: 10/02/23  0813     History  Chief Complaint  Patient presents with   Hemoptysis   GI Bleeding    George Day is a 48 y.o. male.  HPI Patient presents with multiple concerns.  Patient has multiple medical issues including end-stage renal disease, MS. No dialysis in 10 days due to family concerns. He presents today specifically with concern of blood in his stool, as well as bloody vomit.  Patient does not note a history of varices.  His episodes of bleeding have occurred over the past 12 hours and the patient has developed right flank pain during that time.  No fever, chest pain, dyspnea,.  Pain is 5/10.    Home Medications Prior to Admission medications   Medication Sig Start Date End Date Taking? Authorizing Provider  amLODipine (NORVASC) 10 MG tablet TAKE 1 TABLET BY MOUTH DAILY 06/25/23  Yes [name], MD  aspirin EC 81 MG tablet Take 1 tablet (81 mg total) by mouth daily. 11/21/22  Yes Ngetich, Dinah C, NP  atorvastatin (LIPITOR) 40 MG tablet TAKE 1 TABLET BY MOUTH DAILY 06/25/23  Yes [name], MD  carvedilol (COREG) 25 MG tablet TAKE TWO (2) TABLETS BY MOUTH TWICE DAILY 06/25/23  Yes [name], MD  EPINEPHrine 0.3 mg/0.3 mL IJ SOAJ injection Inject 0.3 mg into the muscle as needed for anaphylaxis.   Yes [provider]  nitroGLYCERIN (NITROSTAT) 0.6 MG SL tablet Place 1 tablet (0.6 mg total) under the tongue every 5 (five) minutes as needed for chest pain. 11/21/22  Yes Ngetich, Dinah C, NP  olmesartan (BENICAR) 20 MG tablet Take 1 tablet (20 mg total) by mouth at bedtime for high blood pressure. 04/04/23  Yes   TRELEGY ELLIPTA 100-62.5-25 MCG/ACT AEPB INHALE ONE (1) PUFF BY MOUTH DAILY 09/10/23  Yes [name], MD  DULoxetine (CYMBALTA) 30 MG capsule Take 1 capsule (30 mg total) by mouth daily. Patient not taking: Reported on  10/02/2023 07/24/23   Levert Feinstein, MD  gabapentin (NEURONTIN) 100 MG capsule Take 100mg , three times a week only following dialysis. Patient not taking: Reported on 10/02/2023 09/19/23   [name], MD  sevelamer carbonate (RENVELA) 800 MG tablet Take 1 tablet (800 mg total) by mouth 3 (three) times daily with meals. Patient not taking: Reported on 10/02/2023 04/08/23     sevelamer carbonate (RENVELA) 800 MG tablet Take 1 tablet (800 mg total) by mouth 3 (three) times daily. Patient not taking: Reported on 10/02/2023 09/24/23         Allergies    Honey bee venom [bee venom], Penicillins, and Tomato    Review of Systems   Review of Systems  Physical Exam Updated Vital Signs BP (!) 184/111   Pulse 79   Temp 98.8 F (37.1 C) (Oral)   Resp (!) 25   SpO2 98%  Physical Exam Vitals and nursing note reviewed.  Constitutional:      General: He is not in acute distress.    Appearance: He is well-developed.  HENT:     Head: Normocephalic and atraumatic.  Eyes:     Conjunctiva/sclera: Conjunctivae normal.  Cardiovascular:     Rate and Rhythm: Regular rhythm. Tachycardia present.  Pulmonary:     Effort: Pulmonary effort is normal. No respiratory distress.     Breath sounds: No stridor.  Abdominal:     General: There is no distension.  Tenderness: There is no abdominal tenderness. There is no guarding.  Musculoskeletal:     Comments: L HD access w thrill  Skin:    General: Skin is warm and dry.  Neurological:     Mental Status: He is alert and oriented to person, place, and time.     ED Results / Procedures / Treatments   Labs (all labs ordered are listed, but only abnormal results are displayed) Labs Reviewed  COMPREHENSIVE METABOLIC PANEL - Abnormal; Notable for the following components:      Result Value   CO2 16 (*)    BUN 54 (*)    Creatinine, Ser 6.49 (*)    Calcium 8.7 (*)    GFR, Estimated 10 (*)    All other components within normal limits  CBC WITH  DIFFERENTIAL/PLATELET - Abnormal; Notable for the following components:   Hemoglobin 10.8 (*)    HCT 33.6 (*)    MCV 76.4 (*)    MCH 24.5 (*)    RDW 15.9 (*)    All other components within normal limits  PROTIME-INR  HEPATITIS B SURFACE ANTIGEN  HEPATITIS B SURFACE ANTIBODY, QUANTITATIVE  TYPE AND SCREEN    EKG None  Radiology CT Renal Stone Study Result Date: 10/02/2023 CLINICAL DATA:  Abdominal/flank pain, stone suspected R flank pain. EXAM: CT ABDOMEN AND PELVIS WITHOUT CONTRAST TECHNIQUE: Multidetector CT imaging of the abdomen and pelvis was performed following the standard protocol without IV contrast. RADIATION DOSE REDUCTION: This exam was performed according to the departmental dose-optimization program which includes automated exposure control, adjustment of the mA and/or kV according to patient size and/or use of iterative reconstruction technique. COMPARISON:  CT scan renal stone protocol from 03/03/2023. FINDINGS: Lower chest: The lung bases are clear. No pleural effusion. The heart is mildly enlarged in size. There is stable small/physiological pericardial effusion. Hepatobiliary: The liver is normal in size. Non-cirrhotic configuration. No suspicious mass. No intrahepatic or extrahepatic bile duct dilation. No calcified gallstones. Normal gallbladder wall thickness. No pericholecystic inflammatory changes. Pancreas: Unremarkable. No pancreatic ductal dilatation or surrounding inflammatory changes. Spleen: Within normal limits. No focal lesion. Adrenals/Urinary Tract: Unremarkable left adrenal gland. Right adrenal gland is not well visualized however, instead, in the location of right adrenal gland, there is a 4.7 x 7.6 cm hypoattenuating structure with multiple peripheral linear and punctate calcifications. The appearance is essentially unchanged since the prior study and the structure is present on multiple prior studies. No suspicious renal mass within the limitations of this  unenhanced exam. There is a nearly completely exophytic 1.0 x 1.5 cm cyst arising from the left kidney interpolar region, laterally, unchanged. No nephroureterolithiasis or obstructive uropathy on either side. Redemonstration of mild circumferential thickening of the urinary bladder. However, no focal mass or bladder calculi. No perivesical fat stranding. Findings are likely secondary to chronic versus acute cystitis. Correlate clinically and with urinalysis. Stomach/Bowel: No disproportionate dilation of the small or large bowel loops. No evidence of abnormal bowel wall thickening or inflammatory changes. The appendix is unremarkable. Vascular/Lymphatic: No ascites or pneumoperitoneum. No abdominal or pelvic lymphadenopathy, by size criteria. No aneurysmal dilation of the major abdominal arteries. Reproductive: Normal size prostate. Symmetric seminal vesicles. Other: The visualized soft tissues and abdominal wall are unremarkable. Musculoskeletal: No suspicious osseous lesions. IMPRESSION: 1. No nephroureterolithiasis or obstructive uropathy. 2. No acute inflammatory process identified within the abdomen or pelvis. 3. Redemonstration of cystic right adrenal lesion, unchanged since the prior study and present since multiple prior imaging studies.  4. Multiple other nonacute observations, as described above. Electronically Signed   By: Jules Schick M.D.   On: 10/02/2023 10:35   DG Chest Port 1 View Result Date: 10/02/2023 CLINICAL DATA:  [id number] ESRD (end stage renal disease) on dialysis [facility]) [id number]. Labored breathing. Hemoptysis. EXAM: PORTABLE CHEST 1 VIEW COMPARISON:  02/12/2023. FINDINGS: Low lung volume. Mild central vascular congestion, which may be accentuated by low lung volume. No frank pulmonary edema. Bilateral lung fields are otherwise clear. Bilateral costophrenic angles are clear. Stable cardio-mediastinal silhouette. No acute osseous abnormalities. The soft tissues are within normal limits. Right  IJ hemodialysis catheter noted with its tip overlying the lower portion of superior vena cava. IMPRESSION: *Mild central pulmonary vascular congestion without frank pulmonary edema. Otherwise no acute cardiopulmonary abnormality. Electronically Signed   By: Jules Schick M.D.   On: 10/02/2023 10:27    Procedures Procedures    Medications Ordered in ED Medications  Chlorhexidine Gluconate Cloth 2 % PADS 6 each (has no administration in time range)  pantoprazole (PROTONIX) injection 40 mg (40 mg Intravenous Given 10/02/23 0955)  ondansetron (ZOFRAN) injection 4 mg (4 mg Intravenous Given 10/02/23 0955)    ED Course/ Medical Decision Making/ A&P                                 Medical Decision Making Adult male with multiple medical issues, including end-stage renal disease, now without dialysis for about 10 days presents with flank pain, GI bleed.  Patient is awake, alert, hypertensive, rather than hypotensive as well which is somewhat reassuring initially.  Broad differential given concern for abdominal pain, bleed, including stone, diverticulosis, esophageal varices, gastric etiology.  Patient is nonperitoneal on exam, also somewhat reassuring low suspicion for perforation. Patient started on antiemetics, PPI, continuous monitoring, labs, CT, x-ray all ordered. Cardiac 90 sinus normal Pulse ox 100% room air normal  Amount and/or Complexity of Data Reviewed Independent Historian: spouse    Details: Via telephone External Data Reviewed: notes.    Details: Last clinic notes reviewed Labs: ordered. Decision-making details documented in ED Course. Radiology: ordered and independent interpretation performed. Decision-making details documented in ED Course. ECG/medicine tests: ordered and independent interpretation performed. Decision-making details documented in ED Course.  Risk Prescription drug management. Decision regarding hospitalization. Diagnosis or treatment significantly limited  by social determinants of health.   12:30 PM Patient resting.  Mild tachypnea and hypertension noted.  No new oxygen requirement.  Results reviewed, discussed, hemoglobin dropped four per decaliter over the past few months, but the patient has had no additional vomiting, including blood while in the ED.  Absent hypotension, tachycardia, low suspicion for substantial exsanguination.  I discussed this case with our GI colleagues, they will follow as a consulting service.  I also discussed the patient's case with nephrology colleagues and the patient will have dialysis.  Given hematemesis, fluid overload status patient will be admitted to the internal medicine team for ongoing monitoring, management.        Final Clinical Impression(s) / ED Diagnoses Final diagnoses:  Hematemesis with nausea  Pulmonary congestion    Rx / DC Orders ED Discharge Orders     None         Gerhard Munch, MD 10/02/23 1232

## 2023-10-02 NOTE — H&P (Cosign Needed Addendum)
Date: 10/02/2023               Patient Name:  George Day MRN: [id number]  DOB: October 21, 1975 Age / Sex: 48 y.o., male   PCP: [name], MD         Medical Service: Internal Medicine Teaching Service         Attending Physician: Dr. Dickie La, MD      First Contact: Dr. Faith Rogue, DO Pager [phone]    Second Contact: Dr. Champ Mungo, DO Pager [phone]         After Hours (After 5p/  First Contact Pager: [phone]  weekends / holidays): Second Contact Pager: [phone]   SUBJECTIVE   Chief Complaint: Maroon colored stools   History of Present Illness: George Day is a 48 year old male with a past medical history of ESRD HD MWF, PUD in 2023, IDA, HFpEF, and MS who presented to the emergency department for maroon colored stools and hematemesis.   Patient reported BRBPR that started yesterday evening. Throughout the night, he had multiple maroon colored BM. This morning, he reported hematemesis twice. Patient reported associated symptoms such as fatigue that started one month ago, diffuse abdominal pain that has since resolved, SOB that is chronic and R flank pain that is still present. He reports metallic taste in mouth and brain fog. He denies use of NSAIDs, EtOH use, and illicit drug use. Patient also denies chest pain, dizziness/lightheaded, hematuria.    Review of Systems: A complete ROS was negative except as per HPI.   ED Course: Patient presented to the emergency department hemodynamically stable with a Hgb of 10.8. The ED provider consulted GI and Nephrology while patient was in the emergency department. Patient was in HD while we examined and spoke with him.    Past Medical History: ESRD HD, noncompliant HFpEF HTN IDA MS  PUD with duodenitis in 2023   Meds:   amLODipine (NORVASC) 10 MG tablet aspirin EC 81 MG tablet  atorvastatin (LIPITOR) 40 MG tablet  carvedilol (COREG) 25 MG tablet  EPINEPHrine 0.3 mg/0.3 mL IJ SOAJ injection nitroGLYCERIN  (NITROSTAT) 0.6 MG SL tablet  olmesartan (BENICAR) 20 MG tablet Take 1 tablet (20 mg total) by mouth at bedtime for high blood pressure.  TRELEGY ELLIPTA 100-62.5-25 MCG/ACT AEPB INHALE ONE (1) PUFF BY MOUTH DAILY  DULoxetine (CYMBALTA) 30 MG capsule   gabapentin (NEURONTIN) 100 MG capsule Take 100mg , three times a week only following dialysis.   Not taking: sevelamer carbonate (RENVELA) 800 MG tablet TID    Allergies: Allergies as of 10/02/2023 - Review Complete 10/02/2023  Allergen Reaction Noted   Honey bee venom [bee venom] Anaphylaxis 08/17/2022   Penicillins Anaphylaxis 01/05/2020   Tomato Swelling 08/08/2020    Past Surgical History:  Procedure Laterality Date   AV FISTULA PLACEMENT Left 04/24/2023   Procedure: INSERTION OF LEFT ARM ARTERIOVENOUS (AV) GOR-TEX GRAFT;  Surgeon: Leonie Douglas, MD;  Location: [facility] OR;  Service: Vascular;  Laterality: Left;  with regional block   BIOPSY  08/18/2022   Procedure: BIOPSY;  Surgeon: Imogene Burn, MD;  Location: [facility] ENDOSCOPY;  Service: Gastroenterology;;   CARDIAC CATHETERIZATION  2010   COLONOSCOPY WITH PROPOFOL N/A 08/18/2022   Procedure: COLONOSCOPY WITH PROPOFOL;  Surgeon: Imogene Burn, MD;  Location: [facility] ENDOSCOPY;  Service: Gastroenterology;  Laterality: N/A;   ESOPHAGOGASTRODUODENOSCOPY (EGD) WITH PROPOFOL N/A 08/18/2022   Procedure: ESOPHAGOGASTRODUODENOSCOPY (EGD) WITH PROPOFOL;  Surgeon: Imogene Burn, MD;  Location: [facility] ENDOSCOPY;  Service:  Gastroenterology;  Laterality: N/A;   IR FLUORO GUIDE CV LINE RIGHT  03/05/2023   IR US GUIDE VASC ACCESS RIGHT  03/05/2023   POLYPECTOMY  08/18/2022   Procedure: POLYPECTOMY;  Surgeon: Imogene Burn, MD;  Location: [facility] ENDOSCOPY;  Service: Gastroenterology;;    Social:  Lives With: Wife and daughters  Occupation:N/A Support:Family Level of Function: Independent ADLs and iADLs PCP: Dr. Rosaura Carpenter Substances: Former tobacco user, former social EtOH use, denied illicit drug use    Family History:  Family History  Problem Relation Age of Onset   Diabetes Mother    Hyperlipidemia Mother    Kidney disease Mother    Drug abuse Father    Diabetes Sister    Hyperlipidemia Sister    Hearing loss Sister    Diabetes Brother    Hyperlipidemia Brother      OBJECTIVE:   Physical Exam: Blood pressure (!) 184/111, pulse 79, temperature 98.8 F (37.1 C), temperature source Oral, resp. rate (!) 25, SpO2 98%.  Constitutional: well-appearing, sitting in bed in HD, in no acute distress Cardiovascular: regular rate and rhythm, no m/r/g Pulmonary/Chest: Limited exam due to patient in HD,  normal work of breathing on room air, lungs clear to auscultation bilaterally. No crackles  Abdominal: soft, non-tender, non-distended, R flank tenderness to light palpation: exam limited by current HD session  Neurological: alert & awake, participating in conversation  MSK: No pitting edema Skin: Warm and dry Psych: Normal mood and affect  Labs:    Latest Ref Rng & Units 10/02/2023    9:00 AM 04/24/2023   10:39 AM 04/12/2023   11:41 PM  CBC  WBC 4.0 - 10.5 K/uL 4.6   5.3   Hemoglobin 13.0 - 17.0 g/dL 14.7  82.9  56.2   Hematocrit 39.0 - 52.0 % 33.6  43.0  37.0   Platelets 150 - 400 K/uL 171   171           Latest Ref Rng & Units 10/02/2023    9:00 AM 09/26/2023    4:04 PM 06/13/2023   10:00 AM  CMP  Glucose 70 - 99 mg/dL 83  87    BUN 6 - 20 mg/dL 54  49    Creatinine 1.30 - 1.24 mg/dL 8.65  7.84    Sodium 696 - 145 mmol/L 137  141    Potassium 3.5 - 5.1 mmol/L 4.8  3.7    Chloride 98 - 111 mmol/L 111  106    CO2 22 - 32 mmol/L 16  22    Calcium 8.9 - 10.3 mg/dL 8.7  8.7    Total Protein 6.5 - 8.1 g/dL 7.0   7.6   Total Bilirubin 0.0 - 1.2 mg/dL 0.8     Alkaline Phos 38 - 126 U/L 78     AST 15 - 41 U/L 20     ALT 0 - 44 U/L 19         Imaging: CT Renal Stone Study Result Date: 10/02/2023 CLINICAL DATA:  Abdominal/flank pain, stone suspected R flank pain. EXAM: CT  ABDOMEN AND PELVIS WITHOUT CONTRAST TECHNIQUE: Multidetector CT imaging of the abdomen and pelvis was performed following the standard protocol without IV contrast. RADIATION DOSE REDUCTION: This exam was performed according to the departmental dose-optimization program which includes automated exposure control, adjustment of the mA and/or kV according to patient size and/or use of iterative reconstruction technique. COMPARISON:  CT scan renal stone protocol from 03/03/2023. FINDINGS: Lower chest: The lung bases  are clear. No pleural effusion. The heart is mildly enlarged in size. There is stable small/physiological pericardial effusion. Hepatobiliary: The liver is normal in size. Non-cirrhotic configuration. No suspicious mass. No intrahepatic or extrahepatic bile duct dilation. No calcified gallstones. Normal gallbladder wall thickness. No pericholecystic inflammatory changes. Pancreas: Unremarkable. No pancreatic ductal dilatation or surrounding inflammatory changes. Spleen: Within normal limits. No focal lesion. Adrenals/Urinary Tract: Unremarkable left adrenal gland. Right adrenal gland is not well visualized however, instead, in the location of right adrenal gland, there is a 4.7 x 7.6 cm hypoattenuating structure with multiple peripheral linear and punctate calcifications. The appearance is essentially unchanged since the prior study and the structure is present on multiple prior studies. No suspicious renal mass within the limitations of this unenhanced exam. There is a nearly completely exophytic 1.0 x 1.5 cm cyst arising from the left kidney interpolar region, laterally, unchanged. No nephroureterolithiasis or obstructive uropathy on either side. Redemonstration of mild circumferential thickening of the urinary bladder. However, no focal mass or bladder calculi. No perivesical fat stranding. Findings are likely secondary to chronic versus acute cystitis. Correlate clinically and with urinalysis.  Stomach/Bowel: No disproportionate dilation of the small or large bowel loops. No evidence of abnormal bowel wall thickening or inflammatory changes. The appendix is unremarkable. Vascular/Lymphatic: No ascites or pneumoperitoneum. No abdominal or pelvic lymphadenopathy, by size criteria. No aneurysmal dilation of the major abdominal arteries. Reproductive: Normal size prostate. Symmetric seminal vesicles. Other: The visualized soft tissues and abdominal wall are unremarkable. Musculoskeletal: No suspicious osseous lesions. IMPRESSION: 1. No nephroureterolithiasis or obstructive uropathy. 2. No acute inflammatory process identified within the abdomen or pelvis. 3. Redemonstration of cystic right adrenal lesion, unchanged since the prior study and present since multiple prior imaging studies. 4. Multiple other nonacute observations, as described above. Electronically Signed   By: Jules Schick M.D.   On: 10/02/2023 10:35   DG Chest Port 1 View Result Date: 10/02/2023 CLINICAL DATA:  [id number] ESRD (end stage renal disease) on dialysis [facility]) [id number]. Labored breathing. Hemoptysis. EXAM: PORTABLE CHEST 1 VIEW COMPARISON:  02/12/2023. FINDINGS: Low lung volume. Mild central vascular congestion, which may be accentuated by low lung volume. No frank pulmonary edema. Bilateral lung fields are otherwise clear. Bilateral costophrenic angles are clear. Stable cardio-mediastinal silhouette. No acute osseous abnormalities. The soft tissues are within normal limits. Right IJ hemodialysis catheter noted with its tip overlying the lower portion of superior vena cava. IMPRESSION: *Mild central pulmonary vascular congestion without frank pulmonary edema. Otherwise no acute cardiopulmonary abnormality. Electronically Signed   By: Jules Schick M.D.   On: 10/02/2023 10:27      EKG: Pending  ASSESSMENT & PLAN:   Assessment & Plan by Problem: Principal Problem:   GI bleed   Anil Havard is a 48 y.o. person living with a  history of past medical history of ESRD HD MWF, PUD in 2023, IDA, HFpEF, and MS who presented to the emergency department for maroon colored stools and hematemesis. Who is admitted for Gastrointestinal bleed on hospital day 0  #GI Bleed, presumably upper Patient presented with maroon colored stools and hematemesis in the setting of past PUD and IDA. He is currently HDS with a hemoglobin of 10.8. I suspect that he has a UGIB with the symptomology of maroon colored stools and hematemesis. His symptoms of chronic SOB and a month duration of fatigue are less likely due to the acute anemia and more likely due to uremia 2/2 HD noncompliance. GI is consulted for further  evaluation.   Plan -GI consulted -Start IV PPI 40mg  BID  -Ferritin, iron labs ordered  -Transfuse<7 -Will follow up GI's recommendation -Patient will remain NPO for the time being   #ESRD Patient is on an outpatient HD schedule of MWF. His last HD session was 11 days ago. Per patient, he misses HD due to lack of child care. Patient does report sx of uremia such as brain fog, metallic taste in mouth, and a month duration of fatigue.  Plan: -Nephrology consulted for inpatient HD -BMP tomorrow am  #Right flank pain Patient reports 1 day history of R flank pain that is not associated with hematuria. CT imagining did not reveal a renal stone or obstructive uropathy. Physical exam showed tenderness to light palpation of R lumbar region, exam was limited due to HD. Plan: -Reexamine patient's R lumbar region, will assess for zosters.  -Lidocaine patch for pain   #HTN On a home regimen of Norvasc 10mg , Coreg 25 mg BID, Benicar, will hold regimen at this moment. -Restart regimen when clinically appropriate   #HFpEF LVEF 50-55% 01/2023 Patient does not appear to be hypervolemic on exam; however, CXR does show mild central pulmonary vascular congestion. I suspect the mild pulmonary vascular congestion is due to HD noncompliance.   Plan: -Will treat current signs of hypervolemia with HD -Continue to monitor volume status    Diet: NPO VTE: SCDs Code: Full  Prior to Admission Living Arrangement: Home, living with wife Anticipated Discharge Location: Home Barriers to Discharge: Medical Treatment of GI Bleed   Dispo: Admit patient to Inpatient   Signed:   Faith Rogue, DO Internal Medicine Resident PGY-1 10/02/2023, 2:50 PM   Please contact the on call pager after 5 pm and on weekends at [phone].

## 2023-10-02 NOTE — Procedures (Signed)
I was present at this dialysis session. I have reviewed the session itself and made appropriate changes.   2K bath goal UF of 2.5L using R internal jugular TDC but LUE AVG has been undergoing progressive cannulation as outpt.    There were no vitals filed for this visit.  Recent Labs  Lab 10/02/23 0900  NA 137  K 4.8  CL 111  CO2 16*  GLUCOSE 83  BUN 54*  CREATININE 6.49*  CALCIUM 8.7*    Recent Labs  Lab 10/02/23 0900  WBC 4.6  NEUTROABS 3.3  HGB 10.8*  HCT 33.6*  MCV 76.4*  PLT 171    Scheduled Meds:  Chlorhexidine Gluconate Cloth  6 each Topical Q0600   lidocaine  1 patch Transdermal Q24H   pantoprazole (PROTONIX) IV  40 mg Intravenous Q12H   Continuous Infusions: PRN Meds:.   Sabra Heck  MD 10/02/2023, 3:11 PM

## 2023-10-02 NOTE — ED Triage Notes (Signed)
BIB [facility] from home, c/o dark bloody stool and hemoptysis, no blood thinners, dialysis patient MWF, last dialysis was 2/3. Right flank pain that started last night, Pain  5/10, denies any N/V.    BP 150/80 HR 90 100 % RA 110 cbg

## 2023-10-02 NOTE — Consult Note (Signed)
George Day Admit Date: 10/02/2023 10/02/2023 Arita Miss Requesting Physician:  Jeraldine Loots MD  Reason for Consult:  ESRD, GIB,  HPI:  21M ESRD who presented to the ED after developing maroon-colored stools followed by hematemesis after missing more than a week of dialysis.  PMH Incudes: ESRD on HD MWF at [facility], started dialysis 02/2023 Hypertension History of PUD Chronic HFpEF  Patient's last dialysis treatment was on 09/21/2023.  He is chronically hypertensive including systolics in the 200s at dialysis.  Outpatient regimen listed include amlodipine, carvedilol, olmesartan.  He states that he is unable to come to dialysis because of childcare for his 50-year-old.  His wife is recently started work, training as a PCT in the dialysis clinic.  He had maroon-colored stools overnight and then this morning had 2 episodes of hematemesis prompting coming to the ED.  He has been hemodynamically stable, in fact hypertensive.  Hemoglobin is 10.8 and on 09/16/2023 it was 11.3.    Outpatient HD Rx: MWF, F1 80, 4 hours, blood flow rate 400, dialysate flow rate 700, EDW 89 kg, 2K, 2 calcium bath, heparin 5000 units every treatment, TDC right IJ with LUE AVG in process of cannulation, Mircera last received 08/23/2023, 50 mcg, Hectorol 6 mcg every treatment.  ROS  Balance of 12 systems is negative w/ exceptions as above  PMH  Past Medical History:  Diagnosis Date   (HFpEF) heart failure with preserved ejection fraction (HCC)    Acute kidney injury superimposed on chronic kidney disease (HCC)    Acute respiratory distress 02/13/2023   Anemia 1998   Anginal pain (HCC)    Anxiety    Asthma    CKD (chronic kidney disease) stage 5, GFR less than 15 ml/min (HCC)    Coronary artery disease    Diabetes mellitus without complication (HCC) 1995   Hypertension 2019   MI (myocardial infarction) (HCC) 2010   Multiple sclerosis (HCC)    Sleep apnea    Stroke (HCC) 2010   PSH  Past Surgical History:   Procedure Laterality Date   AV FISTULA PLACEMENT Left 04/24/2023   Procedure: INSERTION OF LEFT ARM ARTERIOVENOUS (AV) GOR-TEX GRAFT;  Surgeon: Leonie Douglas, MD;  Location: [facility] OR;  Service: Vascular;  Laterality: Left;  with regional block   BIOPSY  08/18/2022   Procedure: BIOPSY;  Surgeon: Imogene Burn, MD;  Location: [facility] ENDOSCOPY;  Service: Gastroenterology;;   CARDIAC CATHETERIZATION  2010   COLONOSCOPY WITH PROPOFOL N/A 08/18/2022   Procedure: COLONOSCOPY WITH PROPOFOL;  Surgeon: Imogene Burn, MD;  Location: [facility] ENDOSCOPY;  Service: Gastroenterology;  Laterality: N/A;   ESOPHAGOGASTRODUODENOSCOPY (EGD) WITH PROPOFOL N/A 08/18/2022   Procedure: ESOPHAGOGASTRODUODENOSCOPY (EGD) WITH PROPOFOL;  Surgeon: Imogene Burn, MD;  Location: [facility] ENDOSCOPY;  Service: Gastroenterology;  Laterality: N/A;   IR FLUORO GUIDE CV LINE RIGHT  03/05/2023   IR US GUIDE VASC ACCESS RIGHT  03/05/2023   POLYPECTOMY  08/18/2022   Procedure: POLYPECTOMY;  Surgeon: Imogene Burn, MD;  Location: [facility] ENDOSCOPY;  Service: Gastroenterology;;   FH  Family History  Problem Relation Age of Onset   Diabetes Mother    Hyperlipidemia Mother    Kidney disease Mother    Drug abuse Father    Diabetes Sister    Hyperlipidemia Sister    Hearing loss Sister    Diabetes Brother    Hyperlipidemia Brother    SH  reports that he quit smoking about 2 years ago. His smoking use included cigars, e-cigarettes, and  cigarettes. He started smoking about 33 years ago. He has a 15.5 pack-year smoking history. He has never used smokeless tobacco. He reports that he does not currently use alcohol. He reports current drug use. Drug: Marijuana. Allergies  Allergies  Allergen Reactions   Honey Bee Venom [Bee Venom] Anaphylaxis   Penicillins Anaphylaxis    ALL   Tomato Swelling   Home medications Prior to Admission medications   Medication Sig Start Date End Date Taking? Authorizing Provider  amLODipine (NORVASC) 10 MG tablet TAKE  1 TABLET BY MOUTH DAILY 06/25/23  Yes [name], MD  aspirin EC 81 MG tablet Take 1 tablet (81 mg total) by mouth daily. 11/21/22  Yes Ngetich, Dinah C, NP  atorvastatin (LIPITOR) 40 MG tablet TAKE 1 TABLET BY MOUTH DAILY 06/25/23  Yes [name], MD  carvedilol (COREG) 25 MG tablet TAKE TWO (2) TABLETS BY MOUTH TWICE DAILY 06/25/23  Yes [name], MD  EPINEPHrine 0.3 mg/0.3 mL IJ SOAJ injection Inject 0.3 mg into the muscle as needed for anaphylaxis.   Yes [provider]  nitroGLYCERIN (NITROSTAT) 0.6 MG SL tablet Place 1 tablet (0.6 mg total) under the tongue every 5 (five) minutes as needed for chest pain. 11/21/22  Yes Ngetich, Dinah C, NP  olmesartan (BENICAR) 20 MG tablet Take 1 tablet (20 mg total) by mouth at bedtime for high blood pressure. 04/04/23  Yes   TRELEGY ELLIPTA 100-62.5-25 MCG/ACT AEPB INHALE ONE (1) PUFF BY MOUTH DAILY 09/10/23  Yes [name], MD  DULoxetine (CYMBALTA) 30 MG capsule Take 1 capsule (30 mg total) by mouth daily. Patient not taking: Reported on 10/02/2023 07/24/23   Levert Feinstein, MD  gabapentin (NEURONTIN) 100 MG capsule Take 100mg , three times a week only following dialysis. Patient not taking: Reported on 10/02/2023 09/19/23   [name], MD  sevelamer carbonate (RENVELA) 800 MG tablet Take 1 tablet (800 mg total) by mouth 3 (three) times daily with meals. Patient not taking: Reported on 10/02/2023 04/08/23     sevelamer carbonate (RENVELA) 800 MG tablet Take 1 tablet (800 mg total) by mouth 3 (three) times daily. Patient not taking: Reported on 10/02/2023 09/24/23       Current Medications Scheduled Meds:  Chlorhexidine Gluconate Cloth  6 each Topical Q0600   lidocaine  1 patch Transdermal Q24H   pantoprazole (PROTONIX) IV  40 mg Intravenous Q12H   Continuous Infusions: PRN Meds:.  CBC Recent Labs  Lab 10/02/23 0900  WBC 4.6  NEUTROABS 3.3  HGB 10.8*  HCT 33.6*  MCV 76.4*  PLT 171   Basic Metabolic  Panel Recent Labs  Lab 09/26/23 1604 10/02/23 0900  NA 141 137  K 3.7 4.8  CL 106 111  CO2 22 16*  GLUCOSE 87 83  BUN 49* 54*  CREATININE 7.15* 6.49*  CALCIUM 8.7* 8.7*    Physical Exam  Blood pressure (!) 205/98, pulse 79, temperature 97.8 F (36.6 C), resp. rate (!) 23, SpO2 100%. GEN: NAD, pleasant, interactive ENT: NCAT EYES: EOMI CV: Regular, normal S1 and S2 PULM: Clear bilaterally ABD: Soft, nontender SKIN: Right IJ TDC C/C/I EXT: No peripheral edema LUE AVG with bruit and thrill  Assessment 87M ESRD MWF here with no dialysis in 11 days and UGIB.  ESRD MWF, nonadherence patient states due to childcare issues.  Dialysis currently.  BUN and creatinine suggest significant residual function UGIB: Hb fairly stable.  GI to see.  Likely endoscopy.  Hemodynamically stable. Hypertension, acute and severe: Need to resume  home antihypertensives and see how he does.  Question adherence.  HD currently with UF. CKD/BMD: Calcium stable.  Outpatient phosphorus well-controlled.  Continue sevelamer Anemia: Trend and check iron stores for now, no immediate ESA needed.  Plan As above, HD today and then continue on schedule Resume outpatient hypertensive regimen and trend blood pressures GI to see and evaluate GI bleed Daily weights, Daily Renal Panel, Strict I/Os, Avoid nephrotoxins (NSAIDs, judicious IV Contrast)    Arita Miss  10/02/2023, 3:36 PM

## 2023-10-03 ENCOUNTER — Encounter (HOSPITAL_COMMUNITY): Payer: Self-pay | Admitting: Internal Medicine

## 2023-10-03 ENCOUNTER — Inpatient Hospital Stay (HOSPITAL_COMMUNITY): Payer: Self-pay | Admitting: Anesthesiology

## 2023-10-03 ENCOUNTER — Other Ambulatory Visit (HOSPITAL_COMMUNITY): Payer: Self-pay

## 2023-10-03 ENCOUNTER — Other Ambulatory Visit: Payer: Self-pay

## 2023-10-03 ENCOUNTER — Encounter (HOSPITAL_COMMUNITY)
Admission: EM | Disposition: A | Discharge status: Home / Self Care | Payer: Self-pay | Source: Home / Self Care | Attending: Emergency Medicine

## 2023-10-03 ENCOUNTER — Inpatient Hospital Stay (HOSPITAL_BASED_OUTPATIENT_CLINIC_OR_DEPARTMENT_OTHER): Payer: Self-pay | Admitting: Anesthesiology

## 2023-10-03 DIAGNOSIS — I132 Hypertensive heart and chronic kidney disease with heart failure and with stage 5 chronic kidney disease, or end stage renal disease: Secondary | ICD-10-CM | POA: Diagnosis not present

## 2023-10-03 DIAGNOSIS — N186 End stage renal disease: Secondary | ICD-10-CM

## 2023-10-03 DIAGNOSIS — I5030 Unspecified diastolic (congestive) heart failure: Secondary | ICD-10-CM

## 2023-10-03 DIAGNOSIS — K2981 Duodenitis with bleeding: Secondary | ICD-10-CM

## 2023-10-03 DIAGNOSIS — E1122 Type 2 diabetes mellitus with diabetic chronic kidney disease: Secondary | ICD-10-CM

## 2023-10-03 DIAGNOSIS — K920 Hematemesis: Secondary | ICD-10-CM

## 2023-10-03 DIAGNOSIS — Z992 Dependence on renal dialysis: Secondary | ICD-10-CM | POA: Diagnosis not present

## 2023-10-03 DIAGNOSIS — K2980 Duodenitis without bleeding: Secondary | ICD-10-CM | POA: Diagnosis not present

## 2023-10-03 DIAGNOSIS — K922 Gastrointestinal hemorrhage, unspecified: Secondary | ICD-10-CM | POA: Diagnosis present

## 2023-10-03 HISTORY — PX: ESOPHAGOGASTRODUODENOSCOPY (EGD) WITH PROPOFOL: SHX5813

## 2023-10-03 HISTORY — PX: BIOPSY: SHX5522

## 2023-10-03 LAB — CBC
HCT: 31.7 % — ABNORMAL LOW (ref 39.0–52.0)
Hemoglobin: 10.6 g/dL — ABNORMAL LOW (ref 13.0–17.0)
MCH: 24.4 pg — ABNORMAL LOW (ref 26.0–34.0)
MCHC: 33.4 g/dL (ref 30.0–36.0)
MCV: 73 fL — ABNORMAL LOW (ref 80.0–100.0)
Platelets: 177 10*3/uL (ref 150–400)
RBC: 4.34 MIL/uL (ref 4.22–5.81)
RDW: 15.2 % (ref 11.5–15.5)
WBC: 3.3 10*3/uL — ABNORMAL LOW (ref 4.0–10.5)
nRBC: 0 % (ref 0.0–0.2)

## 2023-10-03 LAB — COMPREHENSIVE METABOLIC PANEL
ALT: 16 U/L (ref 0–44)
AST: 17 U/L (ref 15–41)
Albumin: 3.2 g/dL — ABNORMAL LOW (ref 3.5–5.0)
Alkaline Phosphatase: 63 U/L (ref 38–126)
Anion gap: 11 (ref 5–15)
BUN: 27 mg/dL — ABNORMAL HIGH (ref 6–20)
CO2: 24 mmol/L (ref 22–32)
Calcium: 8.3 mg/dL — ABNORMAL LOW (ref 8.9–10.3)
Chloride: 100 mmol/L (ref 98–111)
Creatinine, Ser: 5.11 mg/dL — ABNORMAL HIGH (ref 0.61–1.24)
GFR, Estimated: 13 mL/min — ABNORMAL LOW (ref >60)
Glucose, Bld: 91 mg/dL (ref 70–99)
Potassium: 3.7 mmol/L (ref 3.5–5.1)
Sodium: 135 mmol/L (ref 135–145)
Total Bilirubin: 1.4 mg/dL — ABNORMAL HIGH (ref 0.0–1.2)
Total Protein: 6.9 g/dL (ref 6.5–8.1)

## 2023-10-03 LAB — GLUCOSE, CAPILLARY: Glucose-Capillary: 80 mg/dL (ref 70–99)

## 2023-10-03 LAB — HEPATITIS B SURFACE ANTIBODY, QUANTITATIVE: Hep B S AB Quant (Post): <3.5 m[IU]/mL — ABNORMAL LOW

## 2023-10-03 SURGERY — ESOPHAGOGASTRODUODENOSCOPY (EGD) WITH PROPOFOL
Anesthesia: Regional

## 2023-10-03 MED ORDER — PANTOPRAZOLE SODIUM 40 MG PO TBEC: 40.0000 mg | DELAYED_RELEASE_TABLET | Freq: Every day | ORAL | Status: DC

## 2023-10-03 MED ORDER — LIDOCAINE 2% (20 MG/ML) 5 ML SYRINGE
INTRAMUSCULAR | Status: DC | PRN
  Administered 2023-10-03: 60 mg via INTRAVENOUS

## 2023-10-03 MED ORDER — PANTOPRAZOLE SODIUM 40 MG PO TBEC
40.0000 mg | DELAYED_RELEASE_TABLET | Freq: Every day | ORAL | 0 refills | Status: DC
  Filled 2023-10-03: qty 30, 30d supply, fill #0

## 2023-10-03 MED ORDER — PROPOFOL 10 MG/ML IV BOLUS
INTRAVENOUS | Status: DC | PRN
  Administered 2023-10-03: 30 mg via INTRAVENOUS

## 2023-10-03 MED ORDER — CHLORHEXIDINE GLUCONATE CLOTH 2 % EX PADS: 6.0000 | MEDICATED_PAD | Freq: Every day | CUTANEOUS | Status: DC

## 2023-10-03 MED ORDER — PROPOFOL 500 MG/50ML IV EMUL
INTRAVENOUS | Status: DC | PRN
  Administered 2023-10-03: 135 ug/kg/min via INTRAVENOUS

## 2023-10-03 MED ORDER — SODIUM CHLORIDE 0.9 % IV SOLN: INTRAVENOUS | Status: DC | PRN

## 2023-10-03 SURGICAL SUPPLY — 14 items

## 2023-10-03 NOTE — Discharge Instructions (Signed)
You came to the hospital for blood in the stool and vomit.  *For your GI bleeding  -We have started you on these following medications:  -Protonix, please take 1 tablet daily 30 to 60 minutes before breakfast -Please see your primary care provider in 7 to 10 days   *For your ESRD -Is very important that you continue to go to dialysis.  Missing dialysis can result in shortness of breath, confusion, seizures, death. -Please ensure that you are making dialysis sessions every Monday Wednesday Friday   Follow-up appointments: Please visit your family doctor in 7 to 10 days   If you have any questions or concerns please feel free to call: Internal medicine clinic at [phone]   If you have any of these following symptoms, please call us or seek care at an emergency department: -Chest Pain -Difficulty Breathing -Syncope (passing out) -Drooping of face -Slurred speech -Sudden weakness in your leg or arm -Fever -Chills   We are glad that you are feeling better, it was a pleasure to care for you!  Faith Rogue DO

## 2023-10-03 NOTE — Transfer of Care (Signed)
Immediate Anesthesia Transfer of Care Note  Patient: George Day  Procedure(s) Performed: ESOPHAGOGASTRODUODENOSCOPY (EGD) WITH PROPOFOL BIOPSY  Patient Location: PACU, endo  Anesthesia Type:MAC  Level of Consciousness: awake and drowsy  Airway & Oxygen Therapy: Patient Spontanous Breathing and Patient connected to nasal cannula oxygen  Post-op Assessment: Report given to RN and Post -op Vital signs reviewed and stable  Post vital signs: Reviewed and stable  Last Vitals:  Vitals Value Taken Time  BP    Temp    Pulse    Resp    SpO2      Last Pain:  Vitals:   10/03/23 0936  TempSrc: Temporal  PainSc: 0-No pain         Complications: No notable events documented.

## 2023-10-03 NOTE — TOC CM/SW Note (Signed)
Transition of Care [facility]) - Inpatient Brief Assessment   Patient Details  Name: George Day MRN: [id number] Date of Birth: March 05, 1976  [facility] CM/SW Contact:    Tom-Johnson, Hershal Coria, RN Phone Number: 10/03/2023, 2:12 PM   Clinical Narrative:  Patient presented to the ED with dark bloody stool and Hemoptysis. Patient is not on blood thinners at home. Admitted with GI Bleed. Underwent Upper Endoscopy today, GI following.  Patient has hx of ESRD, on MWF schedule for outpatient HD, uses [facility]. Patient states he missed a week of HD sessions. Nephrology following for inpatient HD.  From home with wife and four children, has six children and four supportive siblings. Not employed, on disability.   PCP is [name], MD and uses [facility] delivery and also [facility] on [address]   No TOC needs or recommendations noted at this time.  Patient not Medically ready for discharge.  CM will continue to follow as patient progresses with care towards discharge.             Transition of Care Asessment: Insurance and Status: Insurance coverage has been reviewed Patient has primary care physician: Yes Home environment has been reviewed: Yes Prior level of function:: Independent Prior/Current Home Services: No current home services Social Drivers of Health Review: SDOH reviewed no interventions necessary Readmission risk has been reviewed: Yes Transition of care needs: no transition of care needs at this time

## 2023-10-03 NOTE — Anesthesia Postprocedure Evaluation (Signed)
Anesthesia Post Note  Patient: George Day  Procedure(s) Performed: ESOPHAGOGASTRODUODENOSCOPY (EGD) WITH PROPOFOL BIOPSY     Patient location during evaluation: Endoscopy Anesthesia Type: MAC Level of consciousness: awake and alert Pain management: pain level controlled Vital Signs Assessment: post-procedure vital signs reviewed and stable Respiratory status: spontaneous breathing, nonlabored ventilation, respiratory function stable and patient connected to nasal cannula oxygen Cardiovascular status: blood pressure returned to baseline and stable Postop Assessment: no apparent nausea or vomiting Anesthetic complications: no  No notable events documented.  Last Vitals:  Vitals:   10/03/23 1040 10/03/23 1050  BP: (!) 150/93 (!) 153/96  Pulse: 70 66  Resp: 18 19  Temp:    SpO2: 97% 99%    Last Pain:  Vitals:   10/03/23 1050  TempSrc:   PainSc: 0-No pain                 Trevor Iha

## 2023-10-03 NOTE — Anesthesia Procedure Notes (Signed)
Procedure Name: MAC Date/Time: 10/03/2023 10:05 AM  Performed by: Little Ishikawa, CRNAPre-anesthesia Checklist: Patient identified, Emergency Drugs available, Suction available, Timeout performed and Patient being monitored Patient Re-evaluated:Patient Re-evaluated prior to induction Oxygen Delivery Method: Nasal cannula Induction Type: IV induction Dental Injury: Teeth and Oropharynx as per pre-operative assessment

## 2023-10-03 NOTE — Discharge Summary (Addendum)
Name: George Day MRN: [id number] DOB: [birth year] 48 y.o. PCP: George Macadamia, MD  Date of Admission: 10/02/2023  8:13 AM Date of Discharge: 10/03/2023 2:42 PM Attending Physician: Dr. Sol Blazing  Discharge Diagnosis: Principal Problem:   GI bleed Active Problems:   Essential hypertension   Coronary artery disease involving native coronary artery of native heart without angina pectoris   Class 1 obesity due to excess calories with serious comorbidity and body mass index (BMI) of 32.0 to 32.9 in adult   (HFpEF) heart failure with preserved ejection fraction (HCC)   ESRD on dialysis (HCC)   Hematemesis without nausea   Duodenitis    Discharge Medications: Allergies as of 10/03/2023       Reactions   Honey Bee Venom [bee Venom] Anaphylaxis   Penicillins Anaphylaxis   ALL   Tomato Swelling        Medication List     TAKE these medications    amLODipine 10 MG tablet Commonly known as: NORVASC TAKE 1 TABLET BY MOUTH DAILY   aspirin EC 81 MG tablet Take 1 tablet (81 mg total) by mouth daily.   atorvastatin 40 MG tablet Commonly known as: LIPITOR TAKE 1 TABLET BY MOUTH DAILY   carvedilol 25 MG tablet Commonly known as: COREG TAKE TWO (2) TABLETS BY MOUTH TWICE DAILY   DULoxetine 30 MG capsule Commonly known as: Cymbalta Take 1 capsule (30 mg total) by mouth daily.   EPINEPHrine 0.3 mg/0.3 mL Soaj injection Commonly known as: EPI-PEN Inject 0.3 mg into the muscle as needed for anaphylaxis.   gabapentin 100 MG capsule Commonly known as: Neurontin Take 100mg , three times a week only following dialysis.   nitroGLYCERIN 0.6 MG SL tablet Commonly known as: NITROSTAT Place 1 tablet (0.6 mg total) under the tongue every 5 (five) minutes as needed for chest pain.   olmesartan 20 MG tablet Commonly known as: BENICAR Take 1 tablet (20 mg total) by mouth at bedtime for high blood pressure.   pantoprazole 40 MG tablet Commonly known as: PROTONIX Take 1 tablet (40  mg total) by mouth daily.   sevelamer carbonate 800 MG tablet Commonly known as: RENVELA Take 1 tablet (800 mg total) by mouth 3 (three) times daily with meals.   sevelamer carbonate 800 MG tablet Commonly known as: RENVELA Take 1 tablet (800 mg total) by mouth 3 (three) times daily.   Trelegy Ellipta 100-62.5-25 MCG/ACT Aepb Generic drug: Fluticasone-Umeclidin-Vilant INHALE ONE (1) PUFF BY MOUTH DAILY        Disposition and follow-up:   [name] was discharged from [facility] in Good condition.  At the hospital follow up visit please address:  1.  Follow-up:  *GI Bleed *Duodenitis *Query healed Mallory-Weiss tear. -Assess if patient is using Protonix 40 mg daily -Remind patient to avoid NSAIDs and alcohol -Assess for any further bleeding  *ESRD -Please assess if patient is going to dialysis on Monday, Wednesday, Friday -Please note that patient still requires right HD catheter   2.  Labs / imaging needed at time of follow-up: CBC, BMP  3.  Pending labs/ test needing follow-up: Pathology of stomach biopsy for H. pylori  4.  Medication Changes  STOPPED  -N/A   ADDED  -Protonix 40 mg    MODIFIED  -N/A   Hospital Course by problem list: #GI Bleed, presumably upper Patient presented with maroon colored stools and hematemesis in the setting of past PUD and IDA. He is currently HDS with a hemoglobin of  10.8.  Patient remained hemodynamically stable throughout hospitalization and did not require blood transfusions.  GI was consulted and patient underwent an EGD which revealed Duodenitis and possible healed Mallory-Weiss tear. No further bleeding episodes. Patient was discharged home on Protonix 40 mg daily.   #ESRD Patient is on an outpatient HD schedule of MWF. His last HD session was 11 days prior to admission. Per patient, he misses HD due to lack of child care. Patient did report sx at admission of uremia such as brain fog, metallic taste  in mouth, and a month duration of fatigue.  Patient underwent inpatient HD.  He will start outpatient HD on 2/14.  Please assess patient's dialysis compliance.   #Right flank pain Patient reports 1 day history of R flank pain that is not associated with hematuria. CT imagining did not reveal a renal stone or obstructive uropathy.  Physical exam did not reveal evidence of rash, lidocaine patch drastically improved patient's pain.   #HTN On a home regimen of Norvasc 10mg , Coreg 25 mg BID, Benicar, this regimen was originally held but was restarted after hemodialysis.   #HFpEF LVEF 50-55% 01/2023 Patient remained stable from a HFpEF standpoint.  He did not have overt hypervolemia on day of discharge.    Discharge Subjective:  Patient reports feeling well. He denies additional hematemesis.  He has not had a bowel movement yet so he is unsure if he had additional melena. Patient reports that he would be able to attend outpatient HD due to having more help with child care from his neighbors and wife. His child will be in daycare starting 2/24 and he has a plan for coverage until that time.   Discharge Exam:   Blood pressure (!) 153/96, pulse 66, temperature 97.8 F (36.6 C), temperature source Temporal, resp. rate 19, SpO2 99%.  Constitutional:sitting in bed, in no acute distress Cardiovascular: regular rate and rhythm, no m/r/g Pulmonary/Chest: normal work of breathing on room air Abdominal: soft, non-tender, non-distended. Neurological: alert & awake Skin: warm and dry, no evidence of rash on R flank  Psych: Normal mood and affect  Pertinent Labs, Studies, and Procedures:     Latest Ref Rng & Units 10/03/2023    4:21 AM 10/02/2023    9:00 AM 04/24/2023   10:39 AM  CBC  WBC 4.0 - 10.5 K/uL 3.3  4.6    Hemoglobin 13.0 - 17.0 g/dL 16.1  09.6  04.5   Hematocrit 39.0 - 52.0 % 31.7  33.6  43.0   Platelets 150 - 400 K/uL 177  171         Latest Ref Rng & Units 10/03/2023    4:21 AM  10/02/2023    9:00 AM 09/26/2023    4:04 PM  CMP  Glucose 70 - 99 mg/dL 91  83  87   BUN 6 - 20 mg/dL 27  54  49   Creatinine 0.61 - 1.24 mg/dL 4.09  8.11  9.14   Sodium 135 - 145 mmol/L 135  137  141   Potassium 3.5 - 5.1 mmol/L 3.7  4.8  3.7   Chloride 98 - 111 mmol/L 100  111  106   CO2 22 - 32 mmol/L 24  16  22    Calcium 8.9 - 10.3 mg/dL 8.3  8.7  8.7   Total Protein 6.5 - 8.1 g/dL 6.9  7.0    Total Bilirubin 0.0 - 1.2 mg/dL 1.4  0.8    Alkaline Phos 38 - 126 U/L  63  78    AST 15 - 41 U/L 17  20    ALT 0 - 44 U/L 16  19      CT Renal Stone Study Result Date: 10/02/2023 CLINICAL DATA:  Abdominal/flank pain, stone suspected R flank pain. EXAM: CT ABDOMEN AND PELVIS WITHOUT CONTRAST TECHNIQUE: Multidetector CT imaging of the abdomen and pelvis was performed following the standard protocol without IV contrast. RADIATION DOSE REDUCTION: This exam was performed according to the departmental dose-optimization program which includes automated exposure control, adjustment of the mA and/or kV according to patient size and/or use of iterative reconstruction technique. COMPARISON:  CT scan renal stone protocol from 03/03/2023. FINDINGS: Lower chest: The lung bases are clear. No pleural effusion. The heart is mildly enlarged in size. There is stable small/physiological pericardial effusion. Hepatobiliary: The liver is normal in size. Non-cirrhotic configuration. No suspicious mass. No intrahepatic or extrahepatic bile duct dilation. No calcified gallstones. Normal gallbladder wall thickness. No pericholecystic inflammatory changes. Pancreas: Unremarkable. No pancreatic ductal dilatation or surrounding inflammatory changes. Spleen: Within normal limits. No focal lesion. Adrenals/Urinary Tract: Unremarkable left adrenal gland. Right adrenal gland is not well visualized however, instead, in the location of right adrenal gland, there is a 4.7 x 7.6 cm hypoattenuating structure with multiple peripheral linear and  punctate calcifications. The appearance is essentially unchanged since the prior study and the structure is present on multiple prior studies. No suspicious renal mass within the limitations of this unenhanced exam. There is a nearly completely exophytic 1.0 x 1.5 cm cyst arising from the left kidney interpolar region, laterally, unchanged. No nephroureterolithiasis or obstructive uropathy on either side. Redemonstration of mild circumferential thickening of the urinary bladder. However, no focal mass or bladder calculi. No perivesical fat stranding. Findings are likely secondary to chronic versus acute cystitis. Correlate clinically and with urinalysis. Stomach/Bowel: No disproportionate dilation of the small or large bowel loops. No evidence of abnormal bowel wall thickening or inflammatory changes. The appendix is unremarkable. Vascular/Lymphatic: No ascites or pneumoperitoneum. No abdominal or pelvic lymphadenopathy, by size criteria. No aneurysmal dilation of the major abdominal arteries. Reproductive: Normal size prostate. Symmetric seminal vesicles. Other: The visualized soft tissues and abdominal wall are unremarkable. Musculoskeletal: No suspicious osseous lesions. IMPRESSION: 1. No nephroureterolithiasis or obstructive uropathy. 2. No acute inflammatory process identified within the abdomen or pelvis. 3. Redemonstration of cystic right adrenal lesion, unchanged since the prior study and present since multiple prior imaging studies. 4. Multiple other nonacute observations, as described above. Electronically Signed   By: Jules Schick M.D.   On: 10/02/2023 10:35   DG Chest Port 1 View Result Date: 10/02/2023 CLINICAL DATA:  [id number] ESRD (end stage renal disease) on dialysis [facility]) [id number]. Labored breathing. Hemoptysis. EXAM: PORTABLE CHEST 1 VIEW COMPARISON:  02/12/2023. FINDINGS: Low lung volume. Mild central vascular congestion, which may be accentuated by low lung volume. No frank pulmonary edema.  Bilateral lung fields are otherwise clear. Bilateral costophrenic angles are clear. Stable cardio-mediastinal silhouette. No acute osseous abnormalities. The soft tissues are within normal limits. Right IJ hemodialysis catheter noted with its tip overlying the lower portion of superior vena cava. IMPRESSION: *Mild central pulmonary vascular congestion without frank pulmonary edema. Otherwise no acute cardiopulmonary abnormality. Electronically Signed   By: Jules Schick M.D.   On: 10/02/2023 10:27     Discharge Instructions: Discharge Instructions     Call MD for:  difficulty breathing, headache or visual disturbances   Complete by: As directed    Call  MD for:  extreme fatigue   Complete by: As directed    Call MD for:  hives   Complete by: As directed    Call MD for:  persistant dizziness or light-headedness   Complete by: As directed    Call MD for:  persistant nausea and vomiting   Complete by: As directed    Call MD for:  redness, tenderness, or signs of infection (pain, swelling, redness, odor or green/yellow discharge around incision site)   Complete by: As directed    Call MD for:  severe uncontrolled pain   Complete by: As directed    Call MD for:  temperature >100.4   Complete by: As directed    Diet - low sodium heart healthy   Complete by: As directed    Discharge instructions   Complete by: As directed    You came to the hospital for blood in the stool and vomit.  *For your GI bleeding  -We have started you on these following medications:  -Protonix, please take 1 tablet daily 30 to 60 minutes before breakfast -Please see your primary care provider in 7 to 10 days   *For your ESRD -Is very important that you continue to go to dialysis.  Missing dialysis can result in shortness of breath, confusion, seizures, death. -Please ensure that you are making dialysis sessions every Monday Wednesday Friday   Follow-up appointments: Please visit your family doctor in 7 to 10  days   If you have any questions or concerns please feel free to call: Internal medicine clinic at [phone]   If you have any of these following symptoms, please call us or seek care at an emergency department: -Chest Pain -Difficulty Breathing -Syncope (passing out) -Drooping of face -Slurred speech -Sudden weakness in your leg or arm -Fever -Chills   We are glad that you are feeling better, it was a pleasure to care for you!  Faith Rogue DO   Increase activity slowly   Complete by: As directed        Signed: Faith Rogue DO [facility] - PGY1 Pager: [phone] 10/03/2023, 2:42 PM    Please contact the on call pager after 5 pm and on weekends at [phone].

## 2023-10-03 NOTE — Interval H&P Note (Signed)
History and Physical Interval Note: For EGD today The nature of the procedure, as well as the risks, benefits, and alternatives were carefully and thoroughly reviewed with the patient. Ample time for discussion and questions allowed. The patient understood, was satisfied, and agreed to proceed.      Latest Ref Rng & Units 10/03/2023    4:21 AM 10/02/2023    9:00 AM 04/24/2023   10:39 AM  CBC  WBC 4.0 - 10.5 K/uL 3.3  4.6    Hemoglobin 13.0 - 17.0 g/dL 16.1  09.6  04.5   Hematocrit 39.0 - 52.0 % 31.7  33.6  43.0   Platelets 150 - 400 K/uL 177  171       10/03/2023 9:56 AM  George Day  has presented today for surgery, with the diagnosis of Hematemesis anemia.  The various methods of treatment have been discussed with the patient and family. After consideration of risks, benefits and other options for treatment, the patient has consented to  Procedure(s): ESOPHAGOGASTRODUODENOSCOPY (EGD) WITH PROPOFOL (N/A) as a surgical intervention.  The patient's history has been reviewed, patient examined, no change in status, stable for surgery.  I have reviewed the patient's chart and labs.  Questions were answered to the patient's satisfaction.     Carie Caddy Blonnie Maske

## 2023-10-03 NOTE — Progress Notes (Addendum)
[facility] Progress Note   Subjective:    Seen and examined patient at bedside. Tolerated yesterday's HD with net UF 2L. S/p EGD today and results appear overall unremarkable. Biopsies obtained. Next HD 2/14.  Objective Vitals:   10/03/23 1027 10/03/23 1030 10/03/23 1040 10/03/23 1050  BP: 111/64 111/64 (!) 150/93 (!) 153/96  Pulse: 69 67 70 66  Resp: (!) 23 (!) 23 18 19   Temp:      TempSrc:      SpO2: 96% 95% 97% 99%   Physical Exam General: Awake, alert, NAD Heart: S1 and S2; No MRGs Lungs: Clear throughout Abdomen: Soft and non-tender Extremities:No LE edema Dialysis Access: R internal jugular TDC; L ABG (+) B/T   There were no vitals filed for this visit.  Intake/Output Summary (Last 24 hours) at 10/03/2023 1350 Last data filed at 10/03/2023 1020 Gross per 24 hour  Intake 100 ml  Output 2200 ml  Net -2100 ml    Additional Objective Labs: Basic Metabolic Panel: Recent Labs  Lab 09/26/23 1604 10/02/23 0900 10/03/23 0421  NA 141 137 135  K 3.7 4.8 3.7  CL 106 111 100  CO2 22 16* 24  GLUCOSE 87 83 91  BUN 49* 54* 27*  CREATININE 7.15* 6.49* 5.11*  CALCIUM 8.7* 8.7* 8.3*   Liver Function Tests: Recent Labs  Lab 10/02/23 0900 10/03/23 0421  AST 20 17  ALT 19 16  ALKPHOS 78 63  BILITOT 0.8 1.4*  PROT 7.0 6.9  ALBUMIN 3.5 3.2*   No results for input(s): "LIPASE", "AMYLASE" in the last 168 hours. CBC: Recent Labs  Lab 10/02/23 0900 10/03/23 0421  WBC 4.6 3.3*  NEUTROABS 3.3  --   HGB 10.8* 10.6*  HCT 33.6* 31.7*  MCV 76.4* 73.0*  PLT 171 177   Blood Culture No results found for: "SDES", "SPECREQUEST", "CULT", "REPTSTATUS"  Cardiac Enzymes: No results for input(s): "CKTOTAL", "CKMB", "CKMBINDEX", "TROPONINI" in the last 168 hours. CBG: Recent Labs  Lab 09/26/23 1532 10/03/23 0940  GLUCAP 114* 80   Iron Studies:  Recent Labs    10/02/23 1007  IRON 60  TIBC 241*  FERRITIN 458*   Lab Results  Component Value Date    INR 1.1 10/02/2023   INR 1.0 02/12/2023   INR 1.1 08/16/2022   Studies/Results: CT Renal Stone Study Result Date: 10/02/2023 CLINICAL DATA:  Abdominal/flank pain, stone suspected R flank pain. EXAM: CT ABDOMEN AND PELVIS WITHOUT CONTRAST TECHNIQUE: Multidetector CT imaging of the abdomen and pelvis was performed following the standard protocol without IV contrast. RADIATION DOSE REDUCTION: This exam was performed according to the departmental dose-optimization program which includes automated exposure control, adjustment of the mA and/or kV according to patient size and/or use of iterative reconstruction technique. COMPARISON:  CT scan renal stone protocol from 03/03/2023. FINDINGS: Lower chest: The lung bases are clear. No pleural effusion. The heart is mildly enlarged in size. There is stable small/physiological pericardial effusion. Hepatobiliary: The liver is normal in size. Non-cirrhotic configuration. No suspicious mass. No intrahepatic or extrahepatic bile duct dilation. No calcified gallstones. Normal gallbladder wall thickness. No pericholecystic inflammatory changes. Pancreas: Unremarkable. No pancreatic ductal dilatation or surrounding inflammatory changes. Spleen: Within normal limits. No focal lesion. Adrenals/Urinary Tract: Unremarkable left adrenal gland. Right adrenal gland is not well visualized however, instead, in the location of right adrenal gland, there is a 4.7 x 7.6 cm hypoattenuating structure with multiple peripheral linear and punctate calcifications. The appearance is essentially unchanged since the prior  study and the structure is present on multiple prior studies. No suspicious renal mass within the limitations of this unenhanced exam. There is a nearly completely exophytic 1.0 x 1.5 cm cyst arising from the left kidney interpolar region, laterally, unchanged. No nephroureterolithiasis or obstructive uropathy on either side. Redemonstration of mild circumferential thickening of  the urinary bladder. However, no focal mass or bladder calculi. No perivesical fat stranding. Findings are likely secondary to chronic versus acute cystitis. Correlate clinically and with urinalysis. Stomach/Bowel: No disproportionate dilation of the small or large bowel loops. No evidence of abnormal bowel wall thickening or inflammatory changes. The appendix is unremarkable. Vascular/Lymphatic: No ascites or pneumoperitoneum. No abdominal or pelvic lymphadenopathy, by size criteria. No aneurysmal dilation of the major abdominal arteries. Reproductive: Normal size prostate. Symmetric seminal vesicles. Other: The visualized soft tissues and abdominal wall are unremarkable. Musculoskeletal: No suspicious osseous lesions. IMPRESSION: 1. No nephroureterolithiasis or obstructive uropathy. 2. No acute inflammatory process identified within the abdomen or pelvis. 3. Redemonstration of cystic right adrenal lesion, unchanged since the prior study and present since multiple prior imaging studies. 4. Multiple other nonacute observations, as described above. Electronically Signed   By: Jules Schick M.D.   On: 10/02/2023 10:35   DG Chest Port 1 View Result Date: 10/02/2023 CLINICAL DATA:  [id number] ESRD (end stage renal disease) on dialysis [facility]) [id number]. Labored breathing. Hemoptysis. EXAM: PORTABLE CHEST 1 VIEW COMPARISON:  02/12/2023. FINDINGS: Low lung volume. Mild central vascular congestion, which may be accentuated by low lung volume. No frank pulmonary edema. Bilateral lung fields are otherwise clear. Bilateral costophrenic angles are clear. Stable cardio-mediastinal silhouette. No acute osseous abnormalities. The soft tissues are within normal limits. Right IJ hemodialysis catheter noted with its tip overlying the lower portion of superior vena cava. IMPRESSION: *Mild central pulmonary vascular congestion without frank pulmonary edema. Otherwise no acute cardiopulmonary abnormality. Electronically Signed   By: Jules Schick M.D.   On: 10/02/2023 10:27    Medications:   amLODipine  10 mg Oral Daily   atorvastatin  40 mg Oral Daily   carvedilol  50 mg Oral BID WC   Chlorhexidine Gluconate Cloth  6 each Topical Q0600   [START ON 10/04/2023] doxercalciferol  6 mcg Intravenous Q M,W,F-HD   fluticasone furoate-vilanterol  1 puff Inhalation Daily   And   umeclidinium bromide  1 puff Inhalation Daily   irbesartan  150 mg Oral Daily   lidocaine  1 patch Transdermal Q24H   [START ON 10/04/2023] pantoprazole  40 mg Oral QAC breakfast   sevelamer carbonate  800 mg Oral TID WC    Dialysis Orders: [facility] MWF-will need to obtain records  Assessment/Plan: ESRD MWF, nonadherence patient states due to childcare issues.  BUN and creatinine suggest significant residual function. Received HD yesterday. Next HD 2/14 per his routine schedule UGIB: GI following. S/p EGD today. Results appear overal stable. No active signs of bleeding. Hgb remains stable. Hypertension, acute and severe: Need to resume home antihypertensives and see how he does.  Question adherence.  Blood pressures are trending improving. Monitor trend. CKD/BMD: Calcium stable.  Outpatient phosphorus well-controlled.  Continue sevelamer Anemia: Trend and check iron stores for now, no immediate ESA needed. Vascular Access - patient is inquiring on when we can use his AVG so the [facility] can be removed. Per [facility] note from 9/4, we were able to access as of October 2nd. However, it appears we've been using his AVG intermittently and having occasional issues with infiltration d/t  him moving around in the recliner. Plan will be to cannulate tomorrow during HD in outpatient and again on Monday. If no issues, will plan for [facility] removal in outpatient. Dispo - Okay for discharge from a renal standpoint. Patient can resume HD tomorrow in outpatient.  Salome Holmes, NP [facility] 10/03/2023,1:50 PM  LOS: 1 day

## 2023-10-03 NOTE — Op Note (Signed)
[facility] Patient Name: George Day Procedure Date : 10/03/2023 MRN: [id number] Attending MD: Beverley Fiedler , MD, [id number] Date of Birth: 05-13-1976 CSN: [id number] Age: 48 Admit Type: Inpatient Procedure:                Upper GI endoscopy Indications:              Hematemesis Providers:                Carie Caddy. Rhea Belton, MD, Marge Duncans, RN, Alan Ripper,                            Technician Referring MD:             [facility] Medicines:                Propofol per Anesthesia Complications:            No immediate complications. Estimated Blood Loss:     Estimated blood loss: none. Procedure:                Pre-Anesthesia Assessment:                           - Prior to the procedure, a History and Physical                            was performed, and patient medications and                            allergies were reviewed. The patient's tolerance of                            previous anesthesia was also reviewed. The risks                            and benefits of the procedure and the sedation                            options and risks were discussed with the patient.                            All questions were answered, and informed consent                            was obtained. Prior Anticoagulants: The patient has                            taken no anticoagulant or antiplatelet agents. ASA                            Grade Assessment: III - A patient with severe                            systemic disease. After reviewing the risks and  benefits, the patient was deemed in satisfactory                            condition to undergo the procedure.                           After obtaining informed consent, the endoscope was                            passed under direct vision. Throughout the                            procedure, the patient's blood pressure, pulse, and                            oxygen saturations  were monitored continuously. The                            [id number]-[id number] ([id number] Olympus endoscope was introduced                            through the mouth, and advanced to the second part                            of duodenum. The upper GI endoscopy was                            accomplished without difficulty. The patient                            tolerated the procedure well. Scope In: Scope Out: Findings:      The examined esophagus was normal.      The entire examined stomach was normal. Biopsies were taken with a cold       forceps for Helicobacter pylori testing.      Moderate inflammation was found in the duodenal bulb.      The second portion of the duodenum was normal. Impression:               - Normal esophagus.                           - Normal stomach. Biopsied.                           - Duodenitis. Nonbleeding.                           - Normal second portion of the duodenum.                           - Query healed Mallory-Weiss tear. Moderate Sedation:      N/A Recommendation:           - Return patient to hospital ward for ongoing care.                           -  Resume previous diet.                           - Continue present medications. Daily PPI.                           - Await pathology results.                           - GI will sign off, call if questions. Procedure Code(s):        --- Professional ---                           [phone], Esophagogastroduodenoscopy, flexible,                            transoral; with biopsy, single or multiple Diagnosis Code(s):        --- Professional ---                           K29.80, Duodenitis without bleeding                           K92.0, Hematemesis CPT copyright 2022 American [employer] Association. All rights reserved. The codes documented in this report are preliminary and upon coder review may  be revised to meet current compliance requirements. Beverley Fiedler, MD 10/03/2023 10:46:02 AM This report  has been signed electronically. Number of Addenda: 0

## 2023-10-03 NOTE — Care Management Obs Status (Signed)
MEDICARE OBSERVATION STATUS NOTIFICATION   Patient Details  Name: George Day MRN: [id number] Date of Birth: Jul 23, 1976   Medicare Observation Status Notification Given:  Yes    Lockie Pares, RN 10/03/2023, 3:17 PM

## 2023-10-03 NOTE — Care Management CC44 (Signed)
Condition Code 44 Documentation Completed  Patient Details  Name: Wayne Wicklund MRN: [id number] Date of Birth: 1975/11/03   Condition Code 44 given:  Yes Patient signature on Condition Code 44 notice:  Yes Documentation of 2 MD's agreement:  Yes Code 44 added to claim:  Yes    Lockie Pares, RN 10/03/2023, 3:17 PM

## 2023-10-03 NOTE — Progress Notes (Addendum)
Pt receives out-pt HD at [facility] on MWF. Will assist as needed.   Olivia Canter Renal Navigator [phone]  Addendum at 2:27 pm: Contacted by attending regarding pt's d/c today. Contacted [facility] to confirm pt is on the schedule for tomorrow. Navigator advised pt is on the schedule for tomorrow at normal 11:55 am chair time. This info provided to attending and renal NP. Clinic aware pt will d/c today and should resume care tomorrow.

## 2023-10-03 NOTE — Plan of Care (Signed)
  Problem: Education: Goal: Knowledge of General Education information will improve Description: Including pain rating scale, medication(s)/side effects and non-pharmacologic comfort measures Outcome: Adequate for Discharge   Problem: Health Behavior/Discharge Planning: Goal: Ability to manage health-related needs will improve Outcome: Adequate for Discharge   Problem: Clinical Measurements: Goal: Ability to maintain clinical measurements within normal limits will improve Outcome: Adequate for Discharge Goal: Will remain free from infection Outcome: Adequate for Discharge Goal: Diagnostic test results will improve Outcome: Adequate for Discharge Goal: Respiratory complications will improve Outcome: Adequate for Discharge Goal: Cardiovascular complication will be avoided Outcome: Adequate for Discharge   Problem: Activity: Goal: Risk for activity intolerance will decrease Outcome: Adequate for Discharge   Problem: Nutrition: Goal: Adequate nutrition will be maintained Outcome: Adequate for Discharge   Problem: Coping: Goal: Level of anxiety will decrease Outcome: Adequate for Discharge   Problem: Elimination: Goal: Will not experience complications related to bowel motility Outcome: Adequate for Discharge Goal: Will not experience complications related to urinary retention Outcome: Adequate for Discharge   Problem: Pain Managment: Goal: General experience of comfort will improve and/or be controlled Outcome: Adequate for Discharge   Problem: Safety: Goal: Ability to remain free from injury will improve Outcome: Adequate for Discharge   Problem: Skin Integrity: Goal: Risk for impaired skin integrity will decrease Outcome: Adequate for Discharge   Problem: Education: Goal: Knowledge of disease and its progression will improve Outcome: Adequate for Discharge Goal: Individualized Educational Video(s) Outcome: Adequate for Discharge   Problem: Fluid Volume: Goal:  Compliance with measures to maintain balanced fluid volume will improve Outcome: Adequate for Discharge   Problem: Health Behavior/Discharge Planning: Goal: Ability to manage health-related needs will improve Outcome: Adequate for Discharge   Problem: Nutritional: Goal: Ability to make healthy dietary choices will improve Outcome: Adequate for Discharge   Problem: Clinical Measurements: Goal: Complications related to the disease process, condition or treatment will be avoided or minimized Outcome: Adequate for Discharge

## 2023-10-04 ENCOUNTER — Encounter: Payer: Self-pay | Admitting: Internal Medicine

## 2023-10-04 LAB — SURGICAL PATHOLOGY

## 2023-10-04 NOTE — Discharge Planning (Signed)
[facility] Patient Discharge Orders- [facility] CLINIC: [facility]  Patient's name: Doc Mandala Admit/DC Dates: 10/02/2023 - 10/03/2023  Discharge Diagnoses: GIB - S/p EGD: Duodenitis and possible healed Mallory-Weiss tear. No further bleeding. Now on PPI. Hgb have been stable during hospitalization. Uremia 2nd missed HD - resolved. Discussed compliance R flank pain - CT (-) renal stones or obstructive uropathy    Aranesp: Given: No    Last Hgb: 10.6 PRBC's Given: No  ESA dose for discharge: Initiate Micera protocol if Hgb drops below 10 IV Iron dose at discharge: Continue weekly Venofer  Heparin change: No  EDW Change: No   Bath Change: No  Access intervention/Change: No but see below for access plan.  Hectorol change: No  Discharge Labs: Calcium 8.3 Albumin 3.2 K+ 3.7  IV Antibiotics: No  On Coumadin?: No   OTHER/APPTS/LAB ORDERS:  Access Plan: Patient  inquired on when we can use his AVG so the [facility] can be removed. Per [facility] note from 05/08/23, we were able to access as of October 2nd. However, it appears we've been using his AVG intermittently and having occasional issues with infiltration d/t him moving around in the recliner. Additionally, it was noted he frequently misses treatments. Plan will be to cannulate on 2/14 during HD in outpatient and again on 2/17. If no issues, okay to remove his [facility]! Notify the renal team if HD staff are having issues with the cannulation. At that point, will need to send back to [facility].    D/C Meds to be reconciled by nurse after every discharge.  Completed By: Salome Holmes, NP   Reviewed by: MD:______ RN_______

## 2023-10-04 NOTE — TOC Transition Note (Signed)
Transition of Care - Initial Contact from Inpatient Facility  Date of discharge: 10/03/23 Date of contact: 10/04/23 Method: Phone Spoke to: Patient  Patient contacted to discuss transition of care from recent inpatient hospitalization. Patient was admitted to [facility] from 10/02/23-10/03/23 with discharge diagnosis of GIB 2nd possible healed Mallory-Weiss tear/ Duodenitis.   The discharge medication list was reviewed. Patient understands the changes and has no concerns.   Patient will returned to his outpatient HD unit today at [facility].  Patient reports HD staff was able to cannulate his AVG without problems. Plan to cannulate again on 2/17. If continues to have no problems, will remove the [facility].  George Holmes, NP

## 2023-10-07 ENCOUNTER — Encounter (HOSPITAL_COMMUNITY): Payer: Self-pay | Admitting: Internal Medicine

## 2023-10-09 ENCOUNTER — Ambulatory Visit: Payer: Medicare Other | Admitting: Student

## 2023-10-09 VITALS — BP 150/78 | HR 90 | Temp 97.6°F | Ht 68.5 in | Wt 206.0 lb

## 2023-10-09 DIAGNOSIS — Z992 Dependence on renal dialysis: Secondary | ICD-10-CM

## 2023-10-09 DIAGNOSIS — I10 Essential (primary) hypertension: Secondary | ICD-10-CM

## 2023-10-09 DIAGNOSIS — K2981 Duodenitis with bleeding: Secondary | ICD-10-CM | POA: Diagnosis present

## 2023-10-09 DIAGNOSIS — N186 End stage renal disease: Secondary | ICD-10-CM

## 2023-10-09 DIAGNOSIS — E1159 Type 2 diabetes mellitus with other circulatory complications: Secondary | ICD-10-CM | POA: Diagnosis present

## 2023-10-09 DIAGNOSIS — D509 Iron deficiency anemia, unspecified: Secondary | ICD-10-CM

## 2023-10-09 NOTE — Assessment & Plan Note (Addendum)
Admitted 2/12-2/13. EGD revealed duodenitis and possible healed Mallory-Weiss tear. No H. Pylori. Prescribed Protonix 40 mg/daily upon discharge which he has been taking. Denies melena/hematemesis since discharge.  -CBC

## 2023-10-09 NOTE — Progress Notes (Unsigned)
CC: HFU  HPI:  [name] is a 48 y.o. male living with a history stated below and presents today for HFU. Please see problem based assessment and plan for additional details.  Past Medical History:  Diagnosis Date   (HFpEF) heart failure with preserved ejection fraction (HCC)    Acute kidney injury superimposed on chronic kidney disease (HCC)    Acute respiratory distress 02/13/2023   Anemia 1998   Anginal pain (HCC)    Anxiety    Asthma    CKD (chronic kidney disease) stage 5, GFR less than 15 ml/min (HCC)    Coronary artery disease    Diabetes mellitus without complication (HCC) 1995   Hypertension 2019   MI (myocardial infarction) (HCC) 2010   Multiple sclerosis (HCC)    Sleep apnea    Stroke (HCC) 2010    Current Outpatient Medications on File Prior to Visit  Medication Sig Dispense Refill   amLODipine (NORVASC) 10 MG tablet TAKE 1 TABLET BY MOUTH DAILY 30 tablet 10   aspirin EC 81 MG tablet Take 1 tablet (81 mg total) by mouth daily. 90 tablet 1   atorvastatin (LIPITOR) 40 MG tablet TAKE 1 TABLET BY MOUTH DAILY 30 tablet 10   carvedilol (COREG) 25 MG tablet TAKE TWO (2) TABLETS BY MOUTH TWICE DAILY 120 tablet 10   DULoxetine (CYMBALTA) 30 MG capsule Take 1 capsule (30 mg total) by mouth daily. (Patient not taking: Reported on 10/02/2023) 30 capsule 11   EPINEPHrine 0.3 mg/0.3 mL IJ SOAJ injection Inject 0.3 mg into the muscle as needed for anaphylaxis.     gabapentin (NEURONTIN) 100 MG capsule Take 100mg , three times a week only following dialysis. (Patient not taking: Reported on 10/02/2023) 30 capsule 3   nitroGLYCERIN (NITROSTAT) 0.6 MG SL tablet Place 1 tablet (0.6 mg total) under the tongue every 5 (five) minutes as needed for chest pain. 100 tablet 5   olmesartan (BENICAR) 20 MG tablet Take 1 tablet (20 mg total) by mouth at bedtime for high blood pressure. 30 tablet 11   pantoprazole (PROTONIX) 40 MG tablet Take 1 tablet (40 mg total) by mouth daily. 30  tablet 0   sevelamer carbonate (RENVELA) 800 MG tablet Take 1 tablet (800 mg total) by mouth 3 (three) times daily with meals. (Patient not taking: Reported on 10/02/2023) 270 tablet 3   sevelamer carbonate (RENVELA) 800 MG tablet Take 1 tablet (800 mg total) by mouth 3 (three) times daily. (Patient not taking: Reported on 10/02/2023) 270 tablet 3   TRELEGY ELLIPTA 100-62.5-25 MCG/ACT AEPB INHALE ONE (1) PUFF BY MOUTH DAILY 60 each 10   No current facility-administered medications on file prior to visit.    Family History  Problem Relation Age of Onset   Diabetes Mother    Hyperlipidemia Mother    Kidney disease Mother    Drug abuse Father    Diabetes Sister    Hyperlipidemia Sister    Hearing loss Sister    Diabetes Brother    Hyperlipidemia Brother     Social History   Socioeconomic History   Marital status: Married    Spouse name: Not on file   Number of children: 4   Years of education: Not on file   Highest education level: 10th grade  Occupational History   Occupation: Hydrologist  Tobacco Use   Smoking status: Former    Current packs/day: 0.00    Average packs/day: 0.5 packs/day for 31.0 years (15.5 ttl pk-yrs)  Types: Cigars, E-cigarettes, Cigarettes    Start date: 09/1990    Quit date: 09/2021    Years since quitting: 2.0   Smokeless tobacco: Never  Vaping Use   Vaping status: Never Used  Substance and Sexual Activity   Alcohol use: Not Currently    Comment: occasioanlly    Drug use: Not Currently    Types: Marijuana    Comment: 2 x week   Sexual activity: Yes    Birth control/protection: None  Other Topics Concern   Not on file  Social History Narrative   Not on file   Social Drivers of Health   Financial Resource Strain: Medium Risk (09/20/2022)   Overall Financial Resource Strain (CARDIA)    Difficulty of Paying Living Expenses: Somewhat hard  Food Insecurity: Food Insecurity Present (10/02/2023)   Hunger Vital Sign    Worried About  Running Out of Food in the Last Year: Often true    Ran Out of Food in the Last Year: Sometimes true  Transportation Needs: No Transportation Needs (10/02/2023)   PRAPARE - Administrator, Civil Service (Medical): No    Lack of Transportation (Non-Medical): No  Recent Concern: Transportation Needs - Unmet Transportation Needs (09/18/2023)   PRAPARE - Administrator, Civil Service (Medical): Yes    Lack of Transportation (Non-Medical): Yes  Physical Activity: Not on file  Stress: Not on file  Social Connections: Not on file  Intimate Partner Violence: Not At Risk (10/02/2023)   Humiliation, Afraid, Rape, and Kick questionnaire    Fear of Current or Ex-Partner: No    Emotionally Abused: No    Physically Abused: No    Sexually Abused: No    Review of Systems: ROS negative except for what is noted on the assessment and plan.  Vitals:   10/09/23 0909 10/09/23 0937  BP: (!) 174/91 (!) 150/78  Pulse: 94 90  Temp: 97.6 F (36.4 C)   TempSrc: Oral   SpO2: 100%   Weight: 206 lb (93.4 kg)   Height: 5' 8.5" (1.74 m)     Physical Exam: Constitutional: well-appearing, sitting in chair, in no acute distress Cardiovascular: regular rate and rhythm, no m/r/g Pulmonary/Chest: normal work of breathing on room air, lungs clear to auscultation bilaterally Abdominal: soft, non-tender, non-distended MSK: normal bulk and tone Skin: warm and dry Psych: normal mood and behavior  Assessment & Plan:     Patient discussed with Dr. Cleda Daub  GI bleed Admitted 2/12-2/13. EGD revealed duodenitis and possible healed Mallory-Weiss tear. No H. Pylori. Prescribed Protonix 40 mg/daily upon discharge which he has been taking. Denies melena/hematemesis since discharge.  -CBC  ESRD on dialysis (HCC) Compliant with MWF schedule since discharge. No signs of volume overload. -BMP  Essential hypertension BP: (!) 174/91 (!) 150/78  Elevated in office today but patient is  scheduled for HD this afternoon. Asymptomatic. -Continue Norvasc 10 mg, Coreg 25 mg BID, Benicar 20 mg daily   Carmina Miller, D.O. [facility] Internal Medicine, PGY-1 Phone: [phone] Date 10/10/2023 Time 12:34 PM

## 2023-10-09 NOTE — Patient Instructions (Signed)
I will call you with the results for the lab work.  Please continue to take all medications as prescribed and call the clinic or return to the emergency department if you start having blood in your stool and/or vomiting blood.  Follow-up in 3 months.

## 2023-10-10 LAB — CBC
Hematocrit: 24.1 % — ABNORMAL LOW (ref 37.5–51.0)
Hemoglobin: 7.6 g/dL — ABNORMAL LOW (ref 13.0–17.7)
MCH: 24.2 pg — ABNORMAL LOW (ref 26.6–33.0)
MCHC: 31.5 g/dL (ref 31.5–35.7)
MCV: 77 fL — ABNORMAL LOW (ref 79–97)
Platelets: 212 10*3/uL (ref 150–450)
RBC: 3.14 x10E6/uL — ABNORMAL LOW (ref 4.14–5.80)
RDW: 15.8 % — ABNORMAL HIGH (ref 11.6–15.4)
WBC: 7.3 10*3/uL (ref 3.4–10.8)

## 2023-10-10 LAB — BASIC METABOLIC PANEL
BUN/Creatinine Ratio: 11 (ref 9–20)
BUN: 61 mg/dL — ABNORMAL HIGH (ref 6–24)
CO2: 23 mmol/L (ref 20–29)
Calcium: 9.2 mg/dL (ref 8.7–10.2)
Chloride: 99 mmol/L (ref 96–106)
Creatinine, Ser: 5.8 mg/dL — ABNORMAL HIGH (ref 0.76–1.27)
Glucose: 83 mg/dL (ref 70–99)
Potassium: 4.1 mmol/L (ref 3.5–5.2)
Sodium: 138 mmol/L (ref 134–144)
eGFR: 11 mL/min/{1.73_m2} — ABNORMAL LOW (ref >59)

## 2023-10-10 NOTE — Assessment & Plan Note (Addendum)
BP: (!) 174/91 (!) 150/78  Elevated in office today but patient is scheduled for HD this afternoon. Asymptomatic. -Continue Norvasc 10 mg, Coreg 25 mg BID, Benicar 20 mg daily

## 2023-10-10 NOTE — Assessment & Plan Note (Addendum)
Compliant with MWF schedule since discharge. No signs of volume overload. -BMP

## 2023-10-16 ENCOUNTER — Encounter: Payer: Self-pay | Admitting: Neurology

## 2023-10-16 ENCOUNTER — Ambulatory Visit: Payer: Self-pay

## 2023-10-16 NOTE — Patient Instructions (Signed)
 Visit Information  Thank you for taking time to visit with me today. Please don't hesitate to contact me if I can be of assistance to you.   Following are the goals we discussed today:   Goals Addressed             This Visit's Progress    RN CC follow up activities: further follow up call       Care Coordination Interventions: Reviewed and discussed with patient his recent admission to [facility] 10/02/23-10/03/23; dx: GI bleed  Review of patient status, including review of consultant's reports, relevant laboratory and other test results, and medications completed Discussed patient completed his post discharge follow up PCP visit, reviewed PCP recommendations Assessment/Plan: Patient discussed with Dr. Cleda Daub GI bleed Admitted 2/12-2/13. EGD revealed duodenitis and possible healed Mallory-Weiss tear. No H. Pylori. Prescribed Protonix 40 mg/daily upon discharge which he has been taking. Denies melena/hematemesis since discharge.  -CBC ESRD on dialysis (HCC) Compliant with MWF schedule since discharge. No signs of volume overload. -BMP Essential hypertension BP: (!) 174/91 (!) 150/78  Elevated in office today but patient is scheduled for HD this afternoon. Asymptomatic. -Continue Norvasc 10 mg, Coreg 25 mg BID, Benicar 20 mg daily Educated patient on the importance of keeping all scheduled dialysis appointments as scheduled Educated patient about having a low hemoglobin per recent CBC checked by PCP, discussed having this nurse contact the HD clinic to ensure they follow up on his CBC as well as consider adjusting his Heparin dosing with treatments  Discussed plans with patient for ongoing care coordination follow up and provided patient with direct contact information for nurse care coordinator Placed outbound call to [facility], spoke with rep, advised of patient's recent admission with GI bleed and low Hgb, determined the kidney center will recheck  patient's CBC at next treatment as well as consider adjusting heparin dosing        To complete brain and spinal MRI   On track    Care Coordination Interventions: Evaluation of current treatment plan related to Multiple Sclerosis  and patient's adherence to plan as established by provider Per chart review noted patient is scheduled for his thoracic/cervical MRI on 10/26/23 @3 :00 PM brain MRI on 10/31/23         Our next appointment is by telephone on 10/29/23 at 11:00 AM  Please call the care guide team at [phone] if you need to cancel or reschedule your appointment.   If you are experiencing a Mental Health or Behavioral Health Crisis or need someone to talk to, please call 1-800-273-TALK (toll free, 24 hour hotline)  Patient verbalizes understanding of instructions and care plan provided today and agrees to view in MyChart. Active MyChart status and patient understanding of how to access instructions and care plan via MyChart confirmed with patient.     [name] RN BSN CCM   [facility], [facility] Nurse Care Coordinator  Direct Dial: [phone] Website: [email or web address]

## 2023-10-16 NOTE — Patient Outreach (Signed)
 Care Coordination   Follow Up Visit Note   10/16/2023 Name: George Day MRN: [id number] DOB: [birth year]  George Day is a 48 y.o. year old male who sees [name], MD for primary care. I spoke with  George Day by phone today.  What matters to the patients health and wellness today?  Patient would like to make a full recovery from his recent hospital admission without reoccurring symptoms.     Goals Addressed             This Visit's Progress    RN CC follow up activities: further follow up call       Care Coordination Interventions: Reviewed and discussed with patient his recent admission to [facility] 10/02/23-10/03/23; dx: GI bleed  Review of patient status, including review of consultant's reports, relevant laboratory and other test results, and medications completed Discussed patient completed his post discharge follow up PCP visit, reviewed PCP recommendations Assessment/Plan: Patient discussed with Dr. Cleda Daub GI bleed Admitted 2/12-2/13. EGD revealed duodenitis and possible healed Mallory-Weiss tear. No H. Pylori. Prescribed Protonix 40 mg/daily upon discharge which he has been taking. Denies melena/hematemesis since discharge.  -CBC ESRD on dialysis (HCC) Compliant with MWF schedule since discharge. No signs of volume overload. -BMP Essential hypertension BP: (!) 174/91 (!) 150/78  Elevated in office today but patient is scheduled for HD this afternoon. Asymptomatic. -Continue Norvasc 10 mg, Coreg 25 mg BID, Benicar 20 mg daily Educated patient on the importance of keeping all scheduled dialysis appointments as scheduled Educated patient about having a low hemoglobin per recent CBC checked by PCP, discussed having this nurse contact the HD clinic to ensure they follow up on his CBC as well as consider adjusting his Heparin dosing with treatments  Discussed plans with patient for ongoing care coordination follow up and provided patient with direct  contact information for nurse care coordinator Placed outbound call to [facility] on [facility], spoke with rep, advised of patient's recent admission with GI bleed and low Hgb, determined the kidney center will recheck patient's CBC at next treatment as well as consider adjusting heparin dosing        To complete brain and spinal MRI   On track    Care Coordination Interventions: Evaluation of current treatment plan related to Multiple Sclerosis  and patient's adherence to plan as established by provider Per chart review noted patient is scheduled for his thoracic/cervical MRI on 10/26/23 @3 :00 PM brain MRI on 10/31/23     Interventions Today    Flowsheet Row Most Recent Value  Chronic Disease   Chronic disease during today's visit Chronic Kidney Disease/End Stage Renal Disease (ESRD), Other  [s/p GI bleed]  General Interventions   General Interventions Discussed/Reviewed General Interventions Discussed, General Interventions Reviewed, Labs, Doctor Visits, Communication with  Doctor Visits Discussed/Reviewed Doctor Visits Discussed, Doctor Visits Reviewed, PCP  Communication with RN  [[facility],  [place] RN]  Education Interventions   Education Provided Provided Education  Provided Verbal Education On Labs, Medication, When to see the doctor  Labs Reviewed --  [CBC]  Pharmacy Interventions   Pharmacy Dicussed/Reviewed Pharmacy Topics Reviewed, Pharmacy Topics Discussed, Medications and their functions          SDOH assessments and interventions completed:  No     Care Coordination Interventions:  Yes, provided   Follow up plan: Follow up call scheduled for 10/29/23 @11 :00 AM    Encounter Outcome:  Patient Visit Completed

## 2023-10-17 NOTE — Addendum Note (Signed)
 Addended by: Gust Rung on: 10/17/2023 04:24 PM   Modules accepted: Level of Service

## 2023-10-17 NOTE — Progress Notes (Signed)
 Internal Medicine Clinic Attending  Case discussed with the resident at the time of the visit.  We reviewed the resident's history and exam and pertinent patient test results.  I agree with the assessment, diagnosis, and plan of care documented in the resident's note.

## 2023-10-20 ENCOUNTER — Emergency Department (HOSPITAL_BASED_OUTPATIENT_CLINIC_OR_DEPARTMENT_OTHER)

## 2023-10-20 ENCOUNTER — Other Ambulatory Visit: Payer: Self-pay

## 2023-10-20 ENCOUNTER — Encounter (HOSPITAL_COMMUNITY): Payer: Self-pay

## 2023-10-20 ENCOUNTER — Observation Stay (HOSPITAL_COMMUNITY)
Admission: EM | Admit: 2023-10-20 | Discharge: 2023-10-23 | Disposition: A | Discharge status: Home / Self Care | Attending: Internal Medicine | Admitting: Internal Medicine

## 2023-10-20 ENCOUNTER — Emergency Department (HOSPITAL_COMMUNITY)

## 2023-10-20 DIAGNOSIS — K279 Peptic ulcer, site unspecified, unspecified as acute or chronic, without hemorrhage or perforation: Secondary | ICD-10-CM | POA: Insufficient documentation

## 2023-10-20 DIAGNOSIS — D509 Iron deficiency anemia, unspecified: Secondary | ICD-10-CM | POA: Diagnosis not present

## 2023-10-20 DIAGNOSIS — Z992 Dependence on renal dialysis: Secondary | ICD-10-CM | POA: Insufficient documentation

## 2023-10-20 DIAGNOSIS — E785 Hyperlipidemia, unspecified: Secondary | ICD-10-CM | POA: Diagnosis not present

## 2023-10-20 DIAGNOSIS — I5030 Unspecified diastolic (congestive) heart failure: Secondary | ICD-10-CM | POA: Insufficient documentation

## 2023-10-20 DIAGNOSIS — D638 Anemia in other chronic diseases classified elsewhere: Secondary | ICD-10-CM | POA: Insufficient documentation

## 2023-10-20 DIAGNOSIS — E1142 Type 2 diabetes mellitus with diabetic polyneuropathy: Secondary | ICD-10-CM | POA: Diagnosis not present

## 2023-10-20 DIAGNOSIS — G35 Multiple sclerosis: Secondary | ICD-10-CM

## 2023-10-20 DIAGNOSIS — N2581 Secondary hyperparathyroidism of renal origin: Secondary | ICD-10-CM | POA: Diagnosis not present

## 2023-10-20 DIAGNOSIS — D631 Anemia in chronic kidney disease: Secondary | ICD-10-CM | POA: Insufficient documentation

## 2023-10-20 DIAGNOSIS — I132 Hypertensive heart and chronic kidney disease with heart failure and with stage 5 chronic kidney disease, or end stage renal disease: Secondary | ICD-10-CM | POA: Insufficient documentation

## 2023-10-20 DIAGNOSIS — D649 Anemia, unspecified: Principal | ICD-10-CM | POA: Diagnosis present

## 2023-10-20 DIAGNOSIS — N186 End stage renal disease: Secondary | ICD-10-CM | POA: Diagnosis not present

## 2023-10-20 DIAGNOSIS — Z7982 Long term (current) use of aspirin: Secondary | ICD-10-CM | POA: Diagnosis not present

## 2023-10-20 DIAGNOSIS — Z87891 Personal history of nicotine dependence: Secondary | ICD-10-CM | POA: Diagnosis not present

## 2023-10-20 DIAGNOSIS — Z1152 Encounter for screening for COVID-19: Secondary | ICD-10-CM | POA: Diagnosis not present

## 2023-10-20 DIAGNOSIS — Z79899 Other long term (current) drug therapy: Secondary | ICD-10-CM | POA: Insufficient documentation

## 2023-10-20 DIAGNOSIS — T82511A Breakdown (mechanical) of surgically created arteriovenous shunt, initial encounter: Secondary | ICD-10-CM | POA: Diagnosis not present

## 2023-10-20 DIAGNOSIS — J45909 Unspecified asthma, uncomplicated: Secondary | ICD-10-CM | POA: Diagnosis not present

## 2023-10-20 DIAGNOSIS — M79601 Pain in right arm: Secondary | ICD-10-CM | POA: Diagnosis not present

## 2023-10-20 DIAGNOSIS — Z8673 Personal history of transient ischemic attack (TIA), and cerebral infarction without residual deficits: Secondary | ICD-10-CM | POA: Diagnosis not present

## 2023-10-20 DIAGNOSIS — R0602 Shortness of breath: Secondary | ICD-10-CM | POA: Diagnosis present

## 2023-10-20 DIAGNOSIS — E1122 Type 2 diabetes mellitus with diabetic chronic kidney disease: Secondary | ICD-10-CM | POA: Insufficient documentation

## 2023-10-20 DIAGNOSIS — M79622 Pain in left upper arm: Secondary | ICD-10-CM

## 2023-10-20 DIAGNOSIS — T82898A Other specified complication of vascular prosthetic devices, implants and grafts, initial encounter: Secondary | ICD-10-CM | POA: Diagnosis not present

## 2023-10-20 LAB — CBC WITH DIFFERENTIAL/PLATELET
Abs Immature Granulocytes: 0.04 10*3/uL (ref 0.00–0.07)
Basophils Absolute: 0 10*3/uL (ref 0.0–0.1)
Basophils Relative: 0 %
Eosinophils Absolute: 0.1 10*3/uL (ref 0.0–0.5)
Eosinophils Relative: 1 %
HCT: 19.1 % — ABNORMAL LOW (ref 39.0–52.0)
Hemoglobin: 5.7 g/dL — CL (ref 13.0–17.0)
Immature Granulocytes: 1 %
Lymphocytes Relative: 16 %
Lymphs Abs: 1.1 10*3/uL (ref 0.7–4.0)
MCH: 25.6 pg — ABNORMAL LOW (ref 26.0–34.0)
MCHC: 29.8 g/dL — ABNORMAL LOW (ref 30.0–36.0)
MCV: 85.7 fL (ref 80.0–100.0)
Monocytes Absolute: 0.5 10*3/uL (ref 0.1–1.0)
Monocytes Relative: 8 %
Neutro Abs: 5.1 10*3/uL (ref 1.7–7.7)
Neutrophils Relative %: 74 %
Platelets: 156 10*3/uL (ref 150–400)
RBC: 2.23 MIL/uL — ABNORMAL LOW (ref 4.22–5.81)
RDW: 22.5 % — ABNORMAL HIGH (ref 11.5–15.5)
Smear Review: NORMAL
WBC: 6.8 10*3/uL (ref 4.0–10.5)
nRBC: 0.7 % — ABNORMAL HIGH (ref 0.0–0.2)

## 2023-10-20 LAB — COMPREHENSIVE METABOLIC PANEL
ALT: 18 U/L (ref 0–44)
AST: 22 U/L (ref 15–41)
Albumin: 3.1 g/dL — ABNORMAL LOW (ref 3.5–5.0)
Alkaline Phosphatase: 82 U/L (ref 38–126)
Anion gap: 10 (ref 5–15)
BUN: 34 mg/dL — ABNORMAL HIGH (ref 6–20)
CO2: 21 mmol/L — ABNORMAL LOW (ref 22–32)
Calcium: 7.8 mg/dL — ABNORMAL LOW (ref 8.9–10.3)
Chloride: 106 mmol/L (ref 98–111)
Creatinine, Ser: 6.25 mg/dL — ABNORMAL HIGH (ref 0.61–1.24)
GFR, Estimated: 10 mL/min — ABNORMAL LOW (ref >60)
Glucose, Bld: 94 mg/dL (ref 70–99)
Potassium: 3.3 mmol/L — ABNORMAL LOW (ref 3.5–5.1)
Sodium: 137 mmol/L (ref 135–145)
Total Bilirubin: 0.8 mg/dL (ref 0.0–1.2)
Total Protein: 6.3 g/dL — ABNORMAL LOW (ref 6.5–8.1)

## 2023-10-20 LAB — RESP PANEL BY RT-PCR (RSV, FLU A&B, COVID)  RVPGX2
Influenza A by PCR: NEGATIVE
Influenza B by PCR: NEGATIVE
Resp Syncytial Virus by PCR: NEGATIVE
SARS Coronavirus 2 by RT PCR: NEGATIVE

## 2023-10-20 LAB — BRAIN NATRIURETIC PEPTIDE: B Natriuretic Peptide: 357.4 pg/mL — ABNORMAL HIGH (ref 0.0–100.0)

## 2023-10-20 LAB — POC OCCULT BLOOD, ED: Fecal Occult Bld: NEGATIVE

## 2023-10-20 LAB — MAGNESIUM: Magnesium: 2.3 mg/dL (ref 1.7–2.4)

## 2023-10-20 LAB — PREPARE RBC (CROSSMATCH)

## 2023-10-20 MED ORDER — AMLODIPINE BESYLATE 10 MG PO TABS
10.0000 mg | ORAL_TABLET | Freq: Every day | ORAL | Status: DC
  Administered 2023-10-20 – 2023-10-23 (×4): 10 mg via ORAL
  Filled 2023-10-20 (×2): qty 1
  Filled 2023-10-20: qty 2
  Filled 2023-10-20: qty 1

## 2023-10-20 MED ORDER — MORPHINE SULFATE (PF) 4 MG/ML IV SOLN
4.0000 mg | Freq: Once | INTRAVENOUS | Status: AC
  Administered 2023-10-20: 4 mg via INTRAVENOUS
  Filled 2023-10-20: qty 1

## 2023-10-20 MED ORDER — FLUTICASONE FUROATE-VILANTEROL 100-25 MCG/ACT IN AEPB
1.0000 | INHALATION_SPRAY | Freq: Every day | RESPIRATORY_TRACT | Status: DC
  Administered 2023-10-21 – 2023-10-22 (×2): 1 via RESPIRATORY_TRACT
  Filled 2023-10-20: qty 28

## 2023-10-20 MED ORDER — PANTOPRAZOLE SODIUM 40 MG PO TBEC
40.0000 mg | DELAYED_RELEASE_TABLET | Freq: Every day | ORAL | Status: DC
  Administered 2023-10-20 – 2023-10-21 (×2): 40 mg via ORAL
  Filled 2023-10-20 (×2): qty 1

## 2023-10-20 MED ORDER — UMECLIDINIUM BROMIDE 62.5 MCG/ACT IN AEPB
1.0000 | INHALATION_SPRAY | Freq: Every day | RESPIRATORY_TRACT | Status: DC
  Administered 2023-10-21 – 2023-10-22 (×2): 1 via RESPIRATORY_TRACT
  Filled 2023-10-20: qty 7

## 2023-10-20 MED ORDER — IRBESARTAN 150 MG PO TABS
150.0000 mg | ORAL_TABLET | Freq: Every day | ORAL | Status: DC
  Administered 2023-10-20 – 2023-10-23 (×4): 150 mg via ORAL
  Filled 2023-10-20 (×4): qty 1

## 2023-10-20 MED ORDER — ATORVASTATIN CALCIUM 40 MG PO TABS
40.0000 mg | ORAL_TABLET | Freq: Every day | ORAL | Status: DC
  Administered 2023-10-20 – 2023-10-23 (×4): 40 mg via ORAL
  Filled 2023-10-20 (×4): qty 1

## 2023-10-20 MED ORDER — SEVELAMER CARBONATE 800 MG PO TABS
800.0000 mg | ORAL_TABLET | Freq: Three times a day (TID) | ORAL | Status: DC
  Administered 2023-10-21 – 2023-10-23 (×6): 800 mg via ORAL
  Filled 2023-10-20 (×6): qty 1

## 2023-10-20 MED ORDER — GABAPENTIN 100 MG PO CAPS
100.0000 mg | ORAL_CAPSULE | ORAL | Status: DC
  Administered 2023-10-21 – 2023-10-23 (×2): 100 mg via ORAL
  Filled 2023-10-20 (×3): qty 1

## 2023-10-20 MED ORDER — FAMOTIDINE 20 MG PO TABS
20.0000 mg | ORAL_TABLET | Freq: Every day | ORAL | Status: DC
  Administered 2023-10-20: 20 mg via ORAL
  Filled 2023-10-20: qty 1

## 2023-10-20 MED ORDER — CARVEDILOL 25 MG PO TABS
25.0000 mg | ORAL_TABLET | Freq: Every day | ORAL | Status: DC
  Administered 2023-10-20 – 2023-10-23 (×4): 25 mg via ORAL
  Filled 2023-10-20 (×3): qty 1
  Filled 2023-10-20: qty 2

## 2023-10-20 MED ORDER — POLYETHYLENE GLYCOL 3350 17 G PO PACK
17.0000 g | PACK | Freq: Every day | ORAL | Status: DC
Start: 2023-10-20 — End: 2023-10-24
  Administered 2023-10-20 – 2023-10-23 (×3): 17 g via ORAL
  Filled 2023-10-20 (×4): qty 1

## 2023-10-20 MED ORDER — SODIUM CHLORIDE 0.9% IV SOLUTION: Freq: Once | INTRAVENOUS | Status: DC

## 2023-10-20 NOTE — Hospital Course (Signed)
 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  We treated you with a blood transfusion.    *For your AVF (fistula)  -You will need to have an ultrasound of your fistula once the swelling improves.  -Please see your vascular surgeon in 2-4 weeks   *For your anemia -You will need to follow up with GI (stomach doctor) in the office *** Please visit your family doctor on *** Ppi bid ***????   Continue taking your medications as previously prescribed   If you have any questions or concerns please feel free to call: Internal medicine clinic at [phone]   If you have any of these following symptoms, please call us or seek care at an emergency department: -Chest Pain -Difficulty Breathing -Worsening arm  pain -Sensation changes in the left arm or weakness of the left arm  -Syncope (passing out) -Drooping of face -Slurred speech -Sudden weakness in your leg or arm -Fever -Chills   We are glad that you are feeling better, it was a pleasure to care for you!  Faith Rogue DO  _______________________________   Symptomatic anemia Patient presented with a Hgb of 5.7 and received one unit of pRBC transfusion.  The anemia is normocytic which further supports the thought of low RBC production from ESRD causing the anemia. Other sources of anemia in patient are AVF infiltration. GI was consulted and performed a capsule endoscopy which revelaed ***. No evidence of overt GI bleeding during hospitalization. Patient received Aranesp 200 with nephrology.    AVF Infiltration  Vascular US of the LUE showed no pseudoaneurysm or perigraft fluid with significant interstitial edema. The upper extremity is less painful today and remains edematous. Vascular surgery evaluated the patient and there is no surgical intervention at this time. He will need to follow up with their office for a US of the AVF and to determine when it  will be safe to use.    ESRD on MWF HD Has missed 2 HD sessions recently, was apparently to have make-up session yesterday but was not able to do this. Did go to HD on Friday. Iron studies completed 02/12 with elevated ferritin 458, decreased TIBC 241. He received inpatient HD.    HFrEF (LV EF 50-55% 01/2023) No acute concern for CHF exacerbation. BNP elevated to 357.4.  Hypertension With some missed doses particulary of his evening mediations. He is quite hypertensive on arrival. Resumed home regimen.    Type 2 diabetes mellitus Peripheral neuropathy Continued home gabapentin 100 mg three times weekly following HD for neuropathy  Hx asthma Continued formulary equivalent of home trelegy  HLD Continued home atorvastatin 40 mg daily  Peptic ulcer disease Started on protonix 40mg  BID ***

## 2023-10-20 NOTE — Progress Notes (Signed)
 VASCULAR LAB    Left upper extremity duplex of dialysis graft has been performed.  See CV proc for preliminary results.   Loyce Klasen, RVT 10/20/2023, 12:43 PM

## 2023-10-20 NOTE — ED Triage Notes (Signed)
 Pt states he was at dialysis on Friday when he reached over with his right arm and the needle came out of left arm. Pt states his graft immediately infiltrated and swelled up. Staff applied ice and sent him home. Pt states over the weekend it was fine until he woke up this morning and it was painful and swollen again. Pt states he was able to get almost 3 of 4 hours of his dialysis session. Pt c.o increased sob today with chills. Denies chest pain.

## 2023-10-20 NOTE — H&P (Signed)
 Date: 10/20/2023               Patient Name:  George Day MRN: [id number]  DOB: Feb 13, 1976 Age / Sex: 48 y.o., male   PCP: George Macadamia, MD         Medical Service: Internal Medicine Teaching Service         Attending Physician: Dr. Reymundo Poll, MD      First Contact: Dr. Faith Rogue, DO Pager [phone]    Second Contact: Dr. Champ Mungo, DO Pager [phone]         After Hours (After 5p/  First Contact Pager: [phone]  weekends / holidays): Second Contact Pager: [phone]   SUBJECTIVE   Chief Complaint: Left arm pain  History of Present Illness: This is a 48 year old gentleman with pertinent past medical history of ESRD on Monday Wednesday Friday HD, CAD, hypertension, peptic ulcer disease, HFpEF, and presumed MS presenting to the emergency department with 2-day history of arm pain after infiltration at dialysis as well as symptomatic anemia.  The patient states he went to dialysis on Friday, where he had infiltration of his AV fistula access.  On Saturday he states his pain was a little bit better, but woke up on Sunday with worsening pain and swelling.  This prompted him to come into the emergency department.  He also notes that after going to dialysis on Friday they had called him regarding his lab results but he was not able to return the call.  In ED he was found to have symptomatic anemia with hemoglobin of 5.7.  He endorses some chills, cough and sputum production, states he has some sick contacts in the house with similar symptoms.  Otherwise denies fevers, nausea, vomiting, chest pain, shortness of breath, abdominal pain, diarrhea, melena, hematemesis, hematochezia.  He states his stools are sometimes black after dialysis because he gets iron transfusions, but otherwise have been brown.  He does also endorse constipation with bowel movements every 3 days which are hard.  He does endorse little bit of increased exertional dyspnea in the recent days.  He states that he was  unable to go to dialysis on Monday Wednesday of this week, and was supposed to get a make up session on Saturday.  He does endorse taking his medications, but occasionally misses p.m. doses.  ED Course: Vitals: Hypertensive on arrival, persistently hypertensive ranging from 187/106 to 200/101. Lab work demonstrating negative COVID flu RSV, normocytic anemia with hemoglobin 5.7.  Increased RDW.  BNP elevated to 357, chronically elevated.  CMP with potassium 3.3, bicarb 21, creatinine 6.25, BUN 34, albumin 3.1, calcium 7.8.  Mag 2.3.  Fecal occult blood negative.  Chest x-ray today with cardiomegaly and mild vascular congestion.  Preliminary vascular duplex ultrasound of the AV fistula demonstrating narrowing of the proximal graft with elevated velocities.  No pseudoaneurysm or perigraft fluid.  Significant interstitial edema throughout the left upper extremity.  Final read pending.  Given 4 mg IV morphine, despite ESRD.  Meds:  Current Meds  Medication Sig   amLODipine (NORVASC) 10 MG tablet TAKE 1 TABLET BY MOUTH DAILY   aspirin EC 81 MG tablet Take 1 tablet (81 mg total) by mouth daily.   atorvastatin (LIPITOR) 40 MG tablet TAKE 1 TABLET BY MOUTH DAILY   carvedilol (COREG) 25 MG tablet TAKE TWO (2) TABLETS BY MOUTH TWICE DAILY (Patient taking differently: Take 25 mg by mouth daily.)   EPINEPHrine 0.3 mg/0.3 mL IJ SOAJ injection Inject 0.3 mg into the muscle  as needed for anaphylaxis.   famotidine (PEPCID) 20 MG tablet Take 20 mg by mouth daily.   gabapentin (NEURONTIN) 100 MG capsule Take 100mg , three times a week only following dialysis.   nitroGLYCERIN (NITROSTAT) 0.6 MG SL tablet Place 1 tablet (0.6 mg total) under the tongue every 5 (five) minutes as needed for chest pain.   olmesartan (BENICAR) 20 MG tablet Take 1 tablet (20 mg total) by mouth at bedtime for high blood pressure.   pantoprazole (PROTONIX) 40 MG tablet Take 1 tablet (40 mg total) by mouth daily.   sevelamer carbonate (RENVELA)  800 MG tablet Take 1 tablet (800 mg total) by mouth 3 (three) times daily with meals.   TRELEGY ELLIPTA 100-62.5-25 MCG/ACT AEPB INHALE ONE (1) PUFF BY MOUTH DAILY (Patient taking differently: Take 2 puffs by mouth daily.)    Past Medical History  Past Surgical History:  Procedure Laterality Date   AV FISTULA PLACEMENT Left 04/24/2023   Procedure: INSERTION OF LEFT ARM ARTERIOVENOUS (AV) GOR-TEX GRAFT;  Surgeon: George Douglas, MD;  Location: [facility] OR;  Service: Vascular;  Laterality: Left;  with regional block   BIOPSY  08/18/2022   Procedure: BIOPSY;  Surgeon: George Burn, MD;  Location: [facility] ENDOSCOPY;  Service: Gastroenterology;;   BIOPSY  10/03/2023   Procedure: BIOPSY;  Surgeon: George Fiedler, MD;  Location[facility] ENDOSCOPY;  Service: Gastroenterology;;   CARDIAC CATHETERIZATION  2010   COLONOSCOPY WITH PROPOFOL N/A 08/18/2022   Procedure: COLONOSCOPY WITH PROPOFOL;  Surgeon: George Burn, MD;  Location: [facility] ENDOSCOPY;  Service: Gastroenterology;  Laterality: N/A;   ESOPHAGOGASTRODUODENOSCOPY (EGD) WITH PROPOFOL N/A 08/18/2022   Procedure: ESOPHAGOGASTRODUODENOSCOPY (EGD) WITH PROPOFOL;  Surgeon: George Burn, MD;  Location: [facility] ENDOSCOPY;  Service: Gastroenterology;  Laterality: N/A;   ESOPHAGOGASTRODUODENOSCOPY (EGD) WITH PROPOFOL N/A 10/03/2023   Procedure: ESOPHAGOGASTRODUODENOSCOPY (EGD) WITH PROPOFOL;  Surgeon: George Fiedler, MD;  Location: [facility] ENDOSCOPY;  Service: Gastroenterology;  Laterality: N/A;   IR FLUORO GUIDE CV LINE RIGHT  03/05/2023   IR US GUIDE VASC ACCESS RIGHT  03/05/2023   POLYPECTOMY  08/18/2022   Procedure: POLYPECTOMY;  Surgeon: George Burn, MD;  Location: [facility] ENDOSCOPY;  Service: Gastroenterology;;    Social:  Lives With: Wife at home Occupation: Disabled Support: Family Level of Function: Independent with ADLs and IADLs PCP: Dr. Rosaura Carpenter MD Substances: Former tobacco user, occasional alcohol, denies illicit substances.   Allergies: Allergies as of  10/20/2023 - Review Complete 10/20/2023  Allergen Reaction Noted   Honey bee venom [bee venom] Anaphylaxis 08/17/2022   Penicillins Anaphylaxis 01/05/2020   Tomato Swelling 08/08/2020    Review of Systems: Please see HPI and A&P for pertinent positives and negatives.   OBJECTIVE:   Physical Exam: Blood pressure (!) 198/104, pulse 94, temperature 98.6 F (37 C), temperature source Oral, resp. rate 16, height 5\' 8"  (1.727 m), weight 97.5 kg, SpO2 100%.  Constitutional: well-appearing in no acute distress Cardiovascular: regular rate and rhythm, no m/r/g/.  Tunneled dialysis catheter in place. Pulmonary/Chest: normal work of breathing on room air, lungs clear to auscultation bilaterally Abdominal: soft, non-tender, mildly-distended MSK: The left upper extremity is edematous and tense.  The left AV fistula has a palpable thrill, intact pulses at the wrist.  No loss of motor function or sensation in the left hand. Skin: warm and dry Psych: Pleasant affect linear speech  Labs: CBC    Component Value Date/Time   WBC 6.8 10/20/2023 0941   RBC 2.23 (L) 10/20/2023 0941  HGB 5.7 (LL) 10/20/2023 0941   HGB 7.6 (L) 10/09/2023 0914   HCT 19.1 (L) 10/20/2023 0941   HCT 24.1 (L) 10/09/2023 0914   PLT 156 10/20/2023 0941   PLT 212 10/09/2023 0914   MCV 85.7 10/20/2023 0941   MCV 77 (L) 10/09/2023 0914   MCH 25.6 (L) 10/20/2023 0941   MCHC 29.8 (L) 10/20/2023 0941   RDW 22.5 (H) 10/20/2023 0941   RDW 15.8 (H) 10/09/2023 0914   LYMPHSABS 1.1 10/20/2023 0941   MONOABS 0.5 10/20/2023 0941   EOSABS 0.1 10/20/2023 0941   BASOSABS 0.0 10/20/2023 0941     CMP     Component Value Date/Time   NA 137 10/20/2023 0941   NA 138 10/09/2023 0914   K 3.3 (L) 10/20/2023 0941   CL 106 10/20/2023 0941   CO2 21 (L) 10/20/2023 0941   GLUCOSE 94 10/20/2023 0941   BUN 34 (H) 10/20/2023 0941   BUN 61 (H) 10/09/2023 0914   CREATININE 6.25 (H) 10/20/2023 0941   CREATININE 4.05 (H) 11/22/2022 0811    CALCIUM 7.8 (L) 10/20/2023 0941   PROT 6.3 (L) 10/20/2023 0941   PROT 7.6 06/13/2023 1000   ALBUMIN 3.1 (L) 10/20/2023 0941   ALBUMIN 4.5 01/05/2020 1515   AST 22 10/20/2023 0941   ALT 18 10/20/2023 0941   ALKPHOS 82 10/20/2023 0941   BILITOT 0.8 10/20/2023 0941   BILITOT 0.5 01/05/2020 1515   GFRNONAA 10 (L) 10/20/2023 0941   GFRAA 42 (L) 02/25/2020 1507    EKG: personally reviewed my interpretation is normal sinus rhythm, QTc 476. Prior EKG similar  ASSESSMENT & PLAN:   Assessment & Plan by Problem: Principal Problem:   Symptomatic anemia  Symptomatic anemia No clear source of bleeding.  Endorses dark stools, but says this is due to iron transfusion and goes back to normal afterwards.  Hemoglobin 5.7 today, 7.6 11 days ago.  Admission showing duodenitis and possible healed Mallory-Weiss tear.  S/p 1 unit PRBC. Plan: -F/u post-transfusion H&H -Trend CBC, transfuse for Hgb <7 -IV PPI  Vascular access complication Final read for vascular studies pending at this time, however preliminary studies do not show clear graft complication.  The upper extremity is tense for me on exam, and significantly painful.  Patient did receive IV morphine in the emergency department.  He is neurovascularly intact at this time.  If the patient has continued pain in swelling, may have some concern for compartment syndrome. Plan: -Follow-up vascular studies -If worsens, evaluate.  Consider compartment syndrome.  ESRD on MWF HD Has missed 2 HD sessions recently, was apparently to have make-up session yesterday but was not able to do this. Did go to HD on Friday. Iron studies completed 02/12 with elevated ferritin 458, decreased TIBC 241. Plan: -Consult nephrology 03/03 AM for HD needs; no urgent indication for HD -Continue home renvela 800 mg TID  HFrEF (LV EF 50-55% 01/2023) No acute concern for CHF exacerbation. BNP elevated to 357.4. Plan: -HD per nephro 10/21/2023  Hypertension With some  missed doses particulary of his evening mediations. He is quite hypertensive on arrival.  Plan: -Resume home amlodipine 10 mg daily, carvedilol 25 mg daily (prescribed BID but only takes daily), irbesartan 150 mg daily  Type 2 diabetes mellitus Peripheral neuropathy No OP diabetes medications particularly insulin. Plan: -Continue home gabapentin 100 mg three times weekly following HD for neuropathy  Hx asthma No signs of acute exacerbation. Plan: -Continue formulary equivalent of home trelegy  HLD Plan: -Continue  home atorvastatin 40 mg daily  Peptic ulcer disease With recent admission for presumed upper GI bleed. Plan: -Continue protonix 40 mg daily  Diet: Carb/Renal VTE: Heparin IVF: None,None Code: Full  Prior to Admission Living Arrangement:  Home Anticipated Discharge Location:  Home Barriers to Discharge: Clinical improvement  Dispo: Admit patient to Observation with expected length of stay less than 2 midnights.  Signed: [name], MD Internal Medicine Resident PGY-1  10/20/2023, 7:04 PM

## 2023-10-20 NOTE — ED Notes (Signed)
 Rate change of blood admin from 120-150 mL/hr

## 2023-10-20 NOTE — ED Provider Notes (Signed)
 [place] EMERGENCY DEPARTMENT AT [facility] Provider Note   CSN: [id number] Arrival date & time: 10/20/23  0800     History  Chief Complaint  Patient presents with   Vascular Access Problem    George Day is a 48 y.o. male with ESRD on HD MWF, HFpEF, hypertension presents with complaints of left upper extremity pain and swelling.  Patient states he was at his dialysis session on Friday, 2 days ago when his IV infiltrated.  He had swelling in his upper arm.  Ice was applied and he was discharged home.  States he woke up this morning with recurrent swelling and pain.  Describes any pain with mobilizing his upper extremity.  He additionally endorses shortness of breath on exertion that has been ongoing for several days before his latest session on Friday.  He denies any chest pain.  Does endorse a nonproductive cough.  States he feels a little congested.  Notes his family has been sick, he is unsure if he is sick.  He missed dialysis past Monday and follow-up on Tuesday.  Last full completed dialysis session was on last Saturday.  HPI     Home Medications Prior to Admission medications   Medication Sig Start Date End Date Taking? Authorizing Provider  amLODipine (NORVASC) 10 MG tablet TAKE 1 TABLET BY MOUTH DAILY 06/25/23  Yes [name], MD  aspirin EC 81 MG tablet Take 1 tablet (81 mg total) by mouth daily. 11/21/22  Yes Ngetich, Dinah C, NP  atorvastatin (LIPITOR) 40 MG tablet TAKE 1 TABLET BY MOUTH DAILY 06/25/23  Yes [name], MD  carvedilol (COREG) 25 MG tablet TAKE TWO (2) TABLETS BY MOUTH TWICE DAILY Patient taking differently: Take 25 mg by mouth daily. 06/25/23  Yes [name], MD  EPINEPHrine 0.3 mg/0.3 mL IJ SOAJ injection Inject 0.3 mg into the muscle as needed for anaphylaxis.   Yes [provider]  famotidine (PEPCID) 20 MG tablet Take 20 mg by mouth daily. 10/07/23  Yes [provider]  gabapentin (NEURONTIN) 100 MG  capsule Take 100mg , three times a week only following dialysis. 09/19/23  Yes [name], MD  nitroGLYCERIN (NITROSTAT) 0.6 MG SL tablet Place 1 tablet (0.6 mg total) under the tongue every 5 (five) minutes as needed for chest pain. 11/21/22  Yes Ngetich, Dinah C, NP  olmesartan (BENICAR) 20 MG tablet Take 1 tablet (20 mg total) by mouth at bedtime for high blood pressure. 04/04/23  Yes   pantoprazole (PROTONIX) 40 MG tablet Take 1 tablet (40 mg total) by mouth daily. 10/03/23  Yes Faith Rogue, DO  sevelamer carbonate (RENVELA) 800 MG tablet Take 1 tablet (800 mg total) by mouth 3 (three) times daily with meals. 04/08/23  Yes   TRELEGY ELLIPTA 100-62.5-25 MCG/ACT AEPB INHALE ONE (1) PUFF BY MOUTH DAILY Patient taking differently: Take 2 puffs by mouth daily. 09/10/23  Yes [name], MD  DULoxetine (CYMBALTA) 30 MG capsule Take 1 capsule (30 mg total) by mouth daily. Patient not taking: Reported on 10/02/2023 07/24/23   Levert Feinstein, MD  sevelamer carbonate (RENVELA) 800 MG tablet Take 1 tablet (800 mg total) by mouth 3 (three) times daily. Patient not taking: Reported on 10/02/2023 09/24/23         Allergies    Honey bee venom [bee venom], Penicillins, and Tomato    Review of Systems   Review of Systems  Musculoskeletal:  Positive for myalgias.    Physical Exam Updated Vital Signs BP Marland Kitchen)  191/107   Pulse 86   Temp 98.5 F (36.9 C)   Resp 20   Ht 5\' 8"  (1.727 m)   Wt 97.5 kg   SpO2 100%   BMI 32.69 kg/m  Physical Exam Vitals and nursing note reviewed.  Constitutional:      General: He is not in acute distress.    Appearance: He is well-developed.  HENT:     Head: Normocephalic and atraumatic.  Eyes:     Conjunctiva/sclera: Conjunctivae normal.  Cardiovascular:     Rate and Rhythm: Normal rate and regular rhythm.     Heart sounds: Murmur heard.  Pulmonary:     Effort: Pulmonary effort is normal. No respiratory distress.     Breath sounds: Normal breath sounds. No  wheezing or rales.  Abdominal:     Palpations: Abdomen is soft.     Tenderness: There is no abdominal tenderness. There is no guarding or rebound.  Musculoskeletal:     Cervical back: Neck supple.     Comments: Examination of left upper extremity demonstrates swelling and tenderness to biceps area at the location of his dialysis graft.  There is no significant erythema or warmth.  Appears indurated without fluctuance.  Palpable thrill at the site.  He is able to flex without difficulty.  Lacks full extension due to pain.  Radial pulses symmetric.  Capable of making a full fist.  NVI  Skin:    General: Skin is warm and dry.     Capillary Refill: Capillary refill takes less than 2 seconds.     Comments: No obvious wounds in perineum, scrotal area or backside  Neurological:     Mental Status: He is alert.  Psychiatric:        Mood and Affect: Mood normal.     ED Results / Procedures / Treatments   Labs (all labs ordered are listed, but only abnormal results are displayed) Labs Reviewed  CBC WITH DIFFERENTIAL/PLATELET - Abnormal; Notable for the following components:      Result Value   RBC 2.23 (*)    Hemoglobin 5.7 (*)    HCT 19.1 (*)    MCH 25.6 (*)    MCHC 29.8 (*)    RDW 22.5 (*)    nRBC 0.7 (*)    All other components within normal limits  BRAIN NATRIURETIC PEPTIDE - Abnormal; Notable for the following components:   B Natriuretic Peptide 357.4 (*)    All other components within normal limits  COMPREHENSIVE METABOLIC PANEL - Abnormal; Notable for the following components:   Potassium 3.3 (*)    CO2 21 (*)    BUN 34 (*)    Creatinine, Ser 6.25 (*)    Calcium 7.8 (*)    Total Protein 6.3 (*)    Albumin 3.1 (*)    GFR, Estimated 10 (*)    All other components within normal limits  RESP PANEL BY RT-PCR (RSV, FLU A&B, COVID)  RVPGX2  POC OCCULT BLOOD, ED  TYPE AND SCREEN  PREPARE RBC (CROSSMATCH)    EKG EKG Interpretation Date/Time:  Sunday October 20 2023 08:34:23  EST Ventricular Rate:  89 PR Interval:  177 QRS Duration:  96 QT Interval:  391 QTC Calculation: 476 R Axis:   47  Text Interpretation: Sinus rhythm Borderline prolonged QT interval similar to July 2024 Confirmed by Pricilla Loveless [phone]) on 10/20/2023 9:14:38 AM  Radiology VAS US DUPLEX DIALYSIS ACCESS (AVF, AVG) Result Date: 10/20/2023 DIALYSIS ACCESS Patient Name:  ARTY LANTZY  Date of Exam:   10/20/2023 Medical Rec #: [id number]       Accession #:    [id number] Date of Birth: [birth year]        Patient Gender: M Patient Age:   29 years Exam Location:  [facility] Procedure:      VAS US DUPLEX DIALYSIS ACCESS (AVF, AVG) Referring Phys: Fayrene Fearing Emmanuel Ercole --------------------------------------------------------------------------------  Reason for Exam: Infiltration of graft at dialysis Friday. Increasing pain and                  swelling today. Access Site: Left Upper Extremity. Access Type: Ulnar artery to brachial vein Gore-Tex graft. History: ESRD. Recent GI Bleed earlier this month. HTN. CAD. HFpEF. Peptic Ulcer          disease. Multiple Sclerosis. Limitations: Edema and tissue properties Comparison Study: No prior study on file Performing Technologist: Sherren Kerns RVS  Examination Guidelines: A complete evaluation includes B-mode imaging, spectral Doppler, color Doppler, and power Doppler as needed of all accessible portions of each vessel. Unilateral testing is considered an integral part of a complete examination. Limited examinations for reoccurring indications may be performed as noted.  Findings:   +--------------------+----------+-----------------+---------+ AVG                 PSV (cm/s)Flow Vol (mL/min)Describe  +--------------------+----------+-----------------+---------+ Arterial anastomosis   647                               +--------------------+----------+-----------------+---------+ Prox graft             476                     narrowing  +--------------------+----------+-----------------+---------+ Mid graft              261                               +--------------------+----------+-----------------+---------+ Distal graft           340                               +--------------------+----------+-----------------+---------+  Summary: Narrowing noted proximal graft with elevated velocities noted. No pseudoaneurysm or perigraft fluid. Significant interstitial edema throughout the left upper extremity.  *See table(s) above for measurements and observations.     --------------------------------------------------------------------------------   Preliminary    DG Chest 2 View Result Date: 10/20/2023 CLINICAL DATA:  Shortness of breath EXAM: CHEST - 2 VIEW COMPARISON:  With 10/02/2023 FINDINGS: Chronic cardiomegaly. Dialysis catheter on the right with tip at the upper cavoatrial junction. Vascular congestion and fissure thickening on the lateral view. There is no edema, consolidation, effusion, or pneumothorax. Extensive artifact from EKG leads. IMPRESSION: Cardiomegaly and mild vascular congestion. Electronically Signed   By: Tiburcio Pea M.D.   On: 10/20/2023 09:46    Procedures Procedures    Medications Ordered in ED Medications  0.9 %  sodium chloride infusion (Manually program via Guardrails IV Fluids) (has no administration in time range)  morphine (PF) 4 MG/ML injection 4 mg (4 mg Intravenous Given 10/20/23 0936)    ED Course/ Medical Decision Making/ A&P Clinical Course as of 10/20/23 1611  Sun Oct 20, 2023  1359 UE VENOUS DUPLEX (7am - 7pm) [JT]  1608 Hx of ESRD on HD MWF, had LUE swelling  at that time. Improved initially, but returned this AM. Now anemic to 5.7. Admit for anemia and vascular to see. [RK]    Clinical Course User Index [JT] Halford Decamp, PA-C [RK] Caron Presume, MD                                 Medical Decision Making Amount and/or Complexity of Data Reviewed Labs:  ordered. Radiology: ordered.  Risk Prescription drug management.   This patient presents to the ED with chief complaint(s) of shortness of breath and left upper extremity complaint.  The complaint involves an extensive differential diagnosis and also carries with it a high risk of complications and morbidity.   pertinent past medical history as listed in HPI  The differential diagnosis includes  ACS, PE, pneumonia, anemia, URI, bronchitis, COPD, asthma, CHF, septic joint, DVT, AV clotting, infection or hemorrhage  The initial plan is to  Will start with basic labs, EKG, chest x-ray Additional history obtained:  Records reviewed previous admission documents and Care Everywhere/External Records  Initial Assessment:   Patient presents hypertensive 200/100 and tachypneic to 24 with complaints of dialysis graft issue at left upper extremity as well as exertional shortness of breath.  On exam his left upper extremity graft has a palpable thrill, there is surrounding swelling, without erythema or warmth.  It is tender to palpation.  He lacks full elbow extension due to pain.  It does not appear to be acutely infected.  Will start with ultrasound to evaluate further.  His lung sounds are clear.  He does not have significant lower extremity edema.  Independent ECG interpretation:  Sinus rhythm without ischemic changes  Independent labs interpretation:  The following labs were independently interpreted:  CBC with hemoglobin of 5.7, CMP with potassium of 3.3, creatinine 6.25, calcium was 7.8, respiratory panel negative, BNP at 357  Independent visualization and interpretation of imaging: I independently visualized the following imaging with scope of interpretation limited to determining acute life threatening conditions related to emergency care: Chest x-ray, which revealed cardiomegaly and vascular congestion Left upper extremity Doppler  Treatment and Reassessment: Patient given morphine 4 mg  following first assessment Discussed he low hemoglobin the patient.  He is agreeable to transfusion.  Will start with 1 unit of PRBCs  Consultations obtained:   Vascular Dr. Myra Gianotti will take a look at his [place]  Disposition:   Signout given to Dr. Craige Cotta. Disposition pending workup. Anticipate admit.  Social Determinants of Health:   none  This note was dictated with voice recognition software.  Despite best efforts at proofreading, errors may have occurred which can change the documentation meaning.          Final Clinical Impression(s) / ED Diagnoses Final diagnoses:  Anemia, unspecified type  Right arm pain    Rx / DC Orders ED Discharge Orders     None         Halford Decamp, PA-C 10/20/23 1611    Pricilla Loveless, MD 10/22/23 [phone]

## 2023-10-20 NOTE — Consult Note (Signed)
 [facility]  Patient name: George Day MRN: [id number] DOB: 1975/08/22 Sex: male   REQUESTING PROVIDER:   Emergency department   REASON FOR CONSULT:    Left arm swelling  HISTORY OF PRESENT ILLNESS:   Revanth Neidig is a 48 y.o. male, who is status post left upper arm graft on 04/24/2023 by Dr. Lenell Antu.  He currently also has a catheter in place.  He dialyzes on Monday Wednesday Friday.  He had a infiltration on Friday.  He did not complete the entire treatment.  The pain was a little better Saturday but got worse today prompting him to go to the emergency department.  He denies any fevers or chills.  He has no numbness or tingling in his hand.  PAST MEDICAL HISTORY    Past Medical History:  Diagnosis Date   (HFpEF) heart failure with preserved ejection fraction (HCC)    Acute kidney injury superimposed on chronic kidney disease (HCC)    Acute respiratory distress 02/13/2023   Anemia 1998   Anginal pain (HCC)    Anxiety    Asthma    CKD (chronic kidney disease) stage 5, GFR less than 15 ml/min (HCC)    Coronary artery disease    Diabetes mellitus without complication (HCC) 1995   Hypertension 2019   MI (myocardial infarction) (HCC) 2010   Multiple sclerosis (HCC)    Sleep apnea    Stroke (HCC) 2010     FAMILY HISTORY   Family History  Problem Relation Age of Onset   Diabetes Mother    Hyperlipidemia Mother    Kidney disease Mother    Drug abuse Father    Diabetes Sister    Hyperlipidemia Sister    Hearing loss Sister    Diabetes Brother    Hyperlipidemia Brother     SOCIAL HISTORY:   Social History   Socioeconomic History   Marital status: Married    Spouse name: Not on file   Number of children: 4   Years of education: Not on file   Highest education level: 10th grade  Occupational History   Occupation: Hydrologist  Tobacco Use   Smoking status: Former    Current  packs/day: 0.00    Average packs/day: 0.5 packs/day for 31.0 years (15.5 ttl pk-yrs)    Types: Cigars, E-cigarettes, Cigarettes    Start date: 09/1990    Quit date: 09/2021    Years since quitting: 2.0   Smokeless tobacco: Never  Vaping Use   Vaping status: Never Used  Substance and Sexual Activity   Alcohol use: Not Currently    Comment: occasioanlly    Drug use: Not Currently    Types: Marijuana    Comment: 2 x week   Sexual activity: Yes    Birth control/protection: None  Other Topics Concern   Not on file  Social History Narrative   Not on file   Social Drivers of Health   Financial Resource Strain: Medium Risk (09/20/2022)   Overall Financial Resource Strain (CARDIA)    Difficulty of Paying Living Expenses: Somewhat hard  Food Insecurity: Food Insecurity Present (10/20/2023)   Hunger Vital Sign    Worried About Running Out of Food in the Last Year: Often true    Ran Out of Food in the Last Year: Sometimes true  Transportation Needs: No Transportation Needs (10/20/2023)   PRAPARE - Administrator, Civil Service (Medical): No    Lack of Transportation (Non-Medical): No  Recent Concern: Transportation Needs - Unmet Transportation Needs (09/18/2023)   PRAPARE - Administrator, Civil Service (Medical): Yes    Lack of Transportation (Non-Medical): Yes  Physical Activity: Not on file  Stress: Not on file  Social Connections: Moderately Integrated (10/20/2023)   Social Connection and Isolation Panel [NHANES]    Frequency of Communication with Friends and Family: More than three times a week    Frequency of Social Gatherings with Friends and Family: Three times a week    Attends Religious Services: Never    Active Member of Clubs or Organizations: No    Attends Banker Meetings: 1 to 4 times per year    Marital Status: Married  Catering manager Violence: Not At Risk (10/20/2023)   Humiliation, Afraid, Rape, and Kick questionnaire    Fear of  Current or Ex-Partner: No    Emotionally Abused: No    Physically Abused: No    Sexually Abused: No    ALLERGIES:    Allergies  Allergen Reactions   Honey Bee Venom [Bee Venom] Anaphylaxis   Penicillins Anaphylaxis    ALL   Tomato Swelling    CURRENT MEDICATIONS:    Current Facility-Administered Medications  Medication Dose Route Frequency Provider Last Rate Last Admin   0.9 %  sodium chloride infusion (Manually program via Guardrails IV Fluids)   Intravenous Once Halford Decamp, PA-C       amLODipine (NORVASC) tablet 10 mg  10 mg Oral Daily Champ Mungo, DO   10 mg at 10/20/23 1742   atorvastatin (LIPITOR) tablet 40 mg  40 mg Oral Daily Champ Mungo, DO   40 mg at 10/20/23 1742   carvedilol (COREG) tablet 25 mg  25 mg Oral Daily Champ Mungo, DO   25 mg at 10/20/23 1742   fluticasone furoate-vilanterol (BREO ELLIPTA) 100-25 MCG/ACT 1 puff  1 puff Inhalation Daily Champ Mungo, DO       And   umeclidinium bromide (INCRUSE ELLIPTA) 62.5 MCG/ACT 1 puff  1 puff Inhalation Daily Champ Mungo, DO       [START ON 10/21/2023] gabapentin (NEURONTIN) capsule 100 mg  100 mg Oral Once per day on Monday Wednesday Friday Champ Mungo, DO       irbesartan (AVAPRO) tablet 150 mg  150 mg Oral Daily Champ Mungo, DO   150 mg at 10/20/23 1743   pantoprazole (PROTONIX) EC tablet 40 mg  40 mg Oral Daily Champ Mungo, DO   40 mg at 10/20/23 1742   polyethylene glycol (MIRALAX / GLYCOLAX) packet 17 g  17 g Oral Daily Champ Mungo, DO   17 g at 10/20/23 1742   [START ON 10/21/2023] sevelamer carbonate (RENVELA) tablet 800 mg  800 mg Oral TID WC Champ Mungo, DO        REVIEW OF SYSTEMS:   [X]  denotes positive finding, [ ]  denotes negative finding Cardiac  Comments:  Chest pain or chest pressure:    Shortness of breath upon exertion:    Short of breath when lying flat:    Irregular heart rhythm:        Vascular    Pain in calf, thigh, or hip brought on by ambulation:    Pain in feet at night that wakes  you up from your sleep:     Blood clot in your veins:    Leg swelling:         Pulmonary    Oxygen at home:    Productive cough:  Wheezing:         Neurologic    Sudden weakness in arms or legs:     Sudden numbness in arms or legs:     Sudden onset of difficulty speaking or slurred speech:    Temporary loss of vision in one eye:     Problems with dizziness:         Gastrointestinal    Blood in stool:      Vomited blood:         Genitourinary    Burning when urinating:     Blood in urine:        Psychiatric    Major depression:         Hematologic    Bleeding problems:    Problems with blood clotting too easily:        Skin    Rashes or ulcers:        Constitutional    Fever or chills:     PHYSICAL EXAM:   Vitals:   10/20/23 1515 10/20/23 1611 10/20/23 1824 10/20/23 2018  BP: (!) 191/107 (!) 197/110 (!) 198/104 (!) 168/88  Pulse: 86 99 94 81  Resp: 20 17 16 18   Temp:  98.6 F (37 C)  98 F (36.7 C)  TempSrc:  Oral  Oral  SpO2: 100% 98% 100% 100%  Weight:      Height:        GENERAL: The patient is a well-nourished male, in no acute distress. The vital signs are documented above. CARDIAC: There is a regular rate and rhythm.  VASCULAR: Audible bruit within the graft.  Left upper arm is edematous and tight however there is no skin compromise.  This does not appear to be expanding. PULMONARY: Nonlabored respirations ABDOMEN: Soft and non-tender with normal pitched bowel sounds.  MUSCULOSKELETAL: There are no major deformities or cyanosis. NEUROLOGIC: No focal weakness or paresthesias are detected. SKIN: There are no ulcers or rashes noted. PSYCHIATRIC: The patient has a normal affect.  STUDIES:   Narrowing noted proximal graft with elevated velocities noted. No  pseudoaneurysm or perigraft fluid. Significant interstitial edema  throughout the left upper extremity.   ASSESSMENT and PLAN   The patient is being admitted for symptomatic anemia.  He  had an infiltration of his left arm graft and subsequent swelling.  He does not have any skin compromise.  There is no role for surgical exploration.  He should rest the graft into the hematoma resolves which will likely take several weeks.  Fortunately he has a catheter in place.  There is the possibility of a pseudoaneurysm on ultrasound, however this is not clear because of the edema.  He potentially could have his ultrasound repeated in several weeks.  Please contact me if I can be of any further assistance.   Charlena Cross, MD, FACS [facility] [phone] Pager [phone]

## 2023-10-21 DIAGNOSIS — D638 Anemia in other chronic diseases classified elsewhere: Secondary | ICD-10-CM | POA: Insufficient documentation

## 2023-10-21 DIAGNOSIS — D649 Anemia, unspecified: Secondary | ICD-10-CM

## 2023-10-21 DIAGNOSIS — Z992 Dependence on renal dialysis: Secondary | ICD-10-CM | POA: Diagnosis not present

## 2023-10-21 DIAGNOSIS — D509 Iron deficiency anemia, unspecified: Secondary | ICD-10-CM | POA: Diagnosis not present

## 2023-10-21 DIAGNOSIS — N186 End stage renal disease: Secondary | ICD-10-CM

## 2023-10-21 DIAGNOSIS — K279 Peptic ulcer, site unspecified, unspecified as acute or chronic, without hemorrhage or perforation: Secondary | ICD-10-CM | POA: Insufficient documentation

## 2023-10-21 LAB — RENAL FUNCTION PANEL
Albumin: 2.9 g/dL — ABNORMAL LOW (ref 3.5–5.0)
Anion gap: 9 (ref 5–15)
BUN: 34 mg/dL — ABNORMAL HIGH (ref 6–20)
CO2: 21 mmol/L — ABNORMAL LOW (ref 22–32)
Calcium: 8.4 mg/dL — ABNORMAL LOW (ref 8.9–10.3)
Chloride: 108 mmol/L (ref 98–111)
Creatinine, Ser: 5.87 mg/dL — ABNORMAL HIGH (ref 0.61–1.24)
GFR, Estimated: 11 mL/min — ABNORMAL LOW (ref >60)
Glucose, Bld: 88 mg/dL (ref 70–99)
Phosphorus: 4.4 mg/dL (ref 2.5–4.6)
Potassium: 3.8 mmol/L (ref 3.5–5.1)
Sodium: 138 mmol/L (ref 135–145)

## 2023-10-21 LAB — TYPE AND SCREEN
ABO/RH(D): O NEG
Antibody Screen: NEGATIVE
Unit division: 0

## 2023-10-21 LAB — CBC
HCT: 23.4 % — ABNORMAL LOW (ref 39.0–52.0)
Hemoglobin: 7.1 g/dL — ABNORMAL LOW (ref 13.0–17.0)
MCH: 25.9 pg — ABNORMAL LOW (ref 26.0–34.0)
MCHC: 30.3 g/dL (ref 30.0–36.0)
MCV: 85.4 fL (ref 80.0–100.0)
Platelets: 259 10*3/uL (ref 150–400)
RBC: 2.74 MIL/uL — ABNORMAL LOW (ref 4.22–5.81)
RDW: 21.7 % — ABNORMAL HIGH (ref 11.5–15.5)
WBC: 5.6 10*3/uL (ref 4.0–10.5)
nRBC: 0.5 % — ABNORMAL HIGH (ref 0.0–0.2)

## 2023-10-21 LAB — BPAM RBC
Blood Product Expiration Date: 202503102359
ISSUE DATE / TIME: 202503021341
Unit Type and Rh: 9500

## 2023-10-21 LAB — MAGNESIUM: Magnesium: 2.2 mg/dL (ref 1.7–2.4)

## 2023-10-21 LAB — GLUCOSE, CAPILLARY: Glucose-Capillary: 117 mg/dL — ABNORMAL HIGH (ref 70–99)

## 2023-10-21 MED ORDER — HEPARIN SODIUM (PORCINE) 1000 UNIT/ML DIALYSIS
3000.0000 [IU] | Freq: Once | INTRAMUSCULAR | Status: AC
  Administered 2023-10-21: 3000 [IU] via INTRAVENOUS_CENTRAL

## 2023-10-21 MED ORDER — PENTAFLUOROPROP-TETRAFLUOROETH EX AERO: 1.0000 | INHALATION_SPRAY | CUTANEOUS | Status: DC | PRN

## 2023-10-21 MED ORDER — ALTEPLASE 2 MG IJ SOLR: 2.0000 mg | Freq: Once | INTRAMUSCULAR | Status: DC | PRN

## 2023-10-21 MED ORDER — ANTICOAGULANT SODIUM CITRATE 4% (200MG/5ML) IV SOLN: 5.0000 mL | Status: DC | PRN

## 2023-10-21 MED ORDER — LIDOCAINE HCL (PF) 1 % IJ SOLN: 5.0000 mL | INTRAMUSCULAR | Status: DC | PRN

## 2023-10-21 MED ORDER — CHLORHEXIDINE GLUCONATE CLOTH 2 % EX PADS
6.0000 | MEDICATED_PAD | Freq: Every day | CUTANEOUS | Status: DC
  Administered 2023-10-22: 6 via TOPICAL

## 2023-10-21 MED ORDER — NEPRO/CARBSTEADY PO LIQD: 237.0000 mL | ORAL | Status: DC | PRN

## 2023-10-21 MED ORDER — LIDOCAINE-PRILOCAINE 2.5-2.5 % EX CREA: 1.0000 | TOPICAL_CREAM | CUTANEOUS | Status: DC | PRN

## 2023-10-21 MED ORDER — PANTOPRAZOLE SODIUM 40 MG PO TBEC
40.0000 mg | DELAYED_RELEASE_TABLET | Freq: Two times a day (BID) | ORAL | Status: DC
  Administered 2023-10-21 – 2023-10-23 (×4): 40 mg via ORAL
  Filled 2023-10-21 (×4): qty 1

## 2023-10-21 MED ORDER — HEPARIN SODIUM (PORCINE) 1000 UNIT/ML DIALYSIS
1000.0000 [IU] | INTRAMUSCULAR | Status: DC | PRN
  Administered 2023-10-21: 1000 [IU] via INTRAVENOUS_CENTRAL

## 2023-10-21 MED ORDER — HEPARIN SODIUM (PORCINE) 1000 UNIT/ML DIALYSIS
1000.0000 [IU] | INTRAMUSCULAR | Status: DC | PRN
  Administered 2023-10-21: 1000 [IU]

## 2023-10-21 MED ORDER — DARBEPOETIN ALFA 200 MCG/0.4ML IJ SOSY
200.0000 ug | PREFILLED_SYRINGE | INTRAMUSCULAR | Status: DC
  Administered 2023-10-21: 200 ug via SUBCUTANEOUS
  Filled 2023-10-21: qty 0.4

## 2023-10-21 MED ORDER — HEPARIN SODIUM (PORCINE) 1000 UNIT/ML IJ SOLN
INTRAMUSCULAR | Status: AC
  Filled 2023-10-21: qty 3

## 2023-10-21 MED ORDER — HEPARIN SODIUM (PORCINE) 1000 UNIT/ML DIALYSIS
2000.0000 [IU] | INTRAMUSCULAR | Status: AC | PRN
  Administered 2023-10-21: 2000 [IU] via INTRAVENOUS_CENTRAL
  Filled 2023-10-21: qty 2

## 2023-10-21 MED ORDER — PANTOPRAZOLE SODIUM 40 MG PO TBEC: 40.0000 mg | DELAYED_RELEASE_TABLET | Freq: Two times a day (BID) | ORAL | Status: DC

## 2023-10-21 MED ORDER — PANTOPRAZOLE SODIUM 40 MG IV SOLR
40.0000 mg | Freq: Two times a day (BID) | INTRAVENOUS | Status: DC
Start: 2023-10-21 — End: 2023-10-21

## 2023-10-21 MED ORDER — CHLORHEXIDINE GLUCONATE CLOTH 2 % EX PADS
6.0000 | MEDICATED_PAD | Freq: Every day | CUTANEOUS | Status: DC
  Administered 2023-10-21 – 2023-10-23 (×3): 6 via TOPICAL

## 2023-10-21 MED ORDER — POLYETHYLENE GLYCOL 3350 17 GM/SCOOP PO POWD
0.5000 | Freq: Once | ORAL | Status: AC
  Administered 2023-10-21: 127.5 g via ORAL
  Filled 2023-10-21: qty 238

## 2023-10-21 NOTE — Consult Note (Signed)
 Renal Service Consult Note [name]  Sudais Banghart 10/21/2023 Maree Krabbe, MD Requesting Physician: Dr. Antony Contras   Reason for Consult: ESRD pt w/  HPI: The patient is a 48 y.o. year-old w/ PMH as below who presented to ED for pain and swelling in his L arm after an infiltration during his last HD on Friday 2/28. He got 3-4 hrs of dialysis. He applied ice and over on Sat he was fine. In the am on Sunday 3/02, it was painful and swollen again. Some SOB as well. In ED Hb was 5.7 and BP's were moderately high at 180/105. Pt was admitted. We are asked to see for dialysis.    Pt seen up in HD unit. Feeling better today. SP 1u prbc's.  Hb up to 7.1.  Denies any recent volume issues. His TDC is still , they were going to take it out soon. Denies any SOB or vol overload.    PMH HFpEF Anemia Asthma CAD DM2 HTN Multiple sclerosis H/o CVA   ROS - denies CP, no joint pain, no HA, no blurry vision, no rash, no diarrhea, no nausea/ vomiting, no dysuria, no difficulty voiding   Past Medical History  Past Medical History:  Diagnosis Date   (HFpEF) heart failure with preserved ejection fraction (HCC)    Acute kidney injury superimposed on chronic kidney disease (HCC)    Acute respiratory distress 02/13/2023   Anemia 1998   Anginal pain (HCC)    Anxiety    Asthma    CKD (chronic kidney disease) stage 5, GFR less than 15 ml/min (HCC)    Coronary artery disease    Diabetes mellitus without complication (HCC) 1995   Hypertension 2019   MI (myocardial infarction) (HCC) 2010   Multiple sclerosis (HCC)    Sleep apnea    Stroke (HCC) 2010   Past Surgical History  Past Surgical History:  Procedure Laterality Date   AV FISTULA PLACEMENT Left 04/24/2023   Procedure: INSERTION OF LEFT ARM ARTERIOVENOUS (AV) GOR-TEX GRAFT;  Surgeon: Leonie Douglas, MD;  Location: [facility] OR;  Service: Vascular;  Laterality: Left;  with regional block   BIOPSY  08/18/2022   Procedure: BIOPSY;   Surgeon: Imogene Burn, MD;  Location: [facility] ENDOSCOPY;  Service: Gastroenterology;;   BIOPSY  10/03/2023   Procedure: BIOPSY;  Surgeon: Beverley Fiedler, MD;  Location: [facility] ENDOSCOPY;  Service: Gastroenterology;;   CARDIAC CATHETERIZATION  2010   COLONOSCOPY WITH PROPOFOL N/A 08/18/2022   Procedure: COLONOSCOPY WITH PROPOFOL;  Surgeon: Imogene Burn, MD;  Location: [facility] ENDOSCOPY;  Service: Gastroenterology;  Laterality: N/A;   ESOPHAGOGASTRODUODENOSCOPY (EGD) WITH PROPOFOL N/A 08/18/2022   Procedure: ESOPHAGOGASTRODUODENOSCOPY (EGD) WITH PROPOFOL;  Surgeon: Imogene Burn, MD;  Location: [facility] ENDOSCOPY;  Service: Gastroenterology;  Laterality: N/A;   ESOPHAGOGASTRODUODENOSCOPY (EGD) WITH PROPOFOL N/A 10/03/2023   Procedure: ESOPHAGOGASTRODUODENOSCOPY (EGD) WITH PROPOFOL;  Surgeon: Beverley Fiedler, MD;  Location: [facility] ENDOSCOPY;  Service: Gastroenterology;  Laterality: N/A;   IR FLUORO GUIDE CV LINE RIGHT  03/05/2023   IR US GUIDE VASC ACCESS RIGHT  03/05/2023   POLYPECTOMY  08/18/2022   Procedure: POLYPECTOMY;  Surgeon: Imogene Burn, MD;  Location: [facility] ENDOSCOPY;  Service: Gastroenterology;;   Family History  Family History  Problem Relation Age of Onset   Diabetes Mother    Hyperlipidemia Mother    Kidney disease Mother    Drug abuse Father    Diabetes Sister    Hyperlipidemia Sister    Hearing loss  Sister    Diabetes Brother    Hyperlipidemia Brother    Social History  reports that he quit smoking about 2 years ago. His smoking use included cigars, e-cigarettes, and cigarettes. He started smoking about 33 years ago. He has a 15.5 pack-year smoking history. He has never used smokeless tobacco. He reports that he does not currently use alcohol. He reports that he does not currently use drugs after having used the following drugs: Marijuana. Allergies  Allergies  Allergen Reactions   Honey Bee Venom [Bee Venom] Anaphylaxis   Penicillins Anaphylaxis    ALL   Tomato Swelling   Home  medications Prior to Admission medications   Medication Sig Start Date End Date Taking? Authorizing Provider  amLODipine (NORVASC) 10 MG tablet TAKE 1 TABLET BY MOUTH DAILY 06/25/23  Yes [name], MD  aspirin EC 81 MG tablet Take 1 tablet (81 mg total) by mouth daily. 11/21/22  Yes Ngetich, Dinah C, NP  atorvastatin (LIPITOR) 40 MG tablet TAKE 1 TABLET BY MOUTH DAILY 06/25/23  Yes [name], MD  carvedilol (COREG) 25 MG tablet TAKE TWO (2) TABLETS BY MOUTH TWICE DAILY Patient taking differently: Take 25 mg by mouth daily. 06/25/23  Yes [name], MD  EPINEPHrine 0.3 mg/0.3 mL IJ SOAJ injection Inject 0.3 mg into the muscle as needed for anaphylaxis.   Yes [provider]  famotidine (PEPCID) 20 MG tablet Take 20 mg by mouth daily. 10/07/23  Yes [provider]  gabapentin (NEURONTIN) 100 MG capsule Take 100mg , three times a week only following dialysis. 09/19/23  Yes [name], MD  nitroGLYCERIN (NITROSTAT) 0.6 MG SL tablet Place 1 tablet (0.6 mg total) under the tongue every 5 (five) minutes as needed for chest pain. 11/21/22  Yes Ngetich, Dinah C, NP  olmesartan (BENICAR) 20 MG tablet Take 1 tablet (20 mg total) by mouth at bedtime for high blood pressure. 04/04/23  Yes   pantoprazole (PROTONIX) 40 MG tablet Take 1 tablet (40 mg total) by mouth daily. 10/03/23  Yes Faith Rogue, DO  sevelamer carbonate (RENVELA) 800 MG tablet Take 1 tablet (800 mg total) by mouth 3 (three) times daily with meals. 04/08/23  Yes   TRELEGY ELLIPTA 100-62.5-25 MCG/ACT AEPB INHALE ONE (1) PUFF BY MOUTH DAILY Patient taking differently: Take 2 puffs by mouth daily. 09/10/23  Yes [name], MD  DULoxetine (CYMBALTA) 30 MG capsule Take 1 capsule (30 mg total) by mouth daily. Patient not taking: Reported on 10/02/2023 07/24/23   Levert Feinstein, MD  sevelamer carbonate (RENVELA) 800 MG tablet Take 1 tablet (800 mg total) by mouth 3 (three) times daily. Patient not taking:  Reported on 10/02/2023 09/24/23        Vitals:   10/20/23 2018 10/21/23 0025 10/21/23 0454 10/21/23 0827  BP: (!) 168/88 132/81 (!) 155/66 (!) 164/95  Pulse: 81 80 76 77  Resp: 18 18 18 18   Temp: 98 F (36.7 C) 98.2 F (36.8 C) 98.1 F (36.7 C) 98.4 F (36.9 C)  TempSrc: Oral Oral Oral Oral  SpO2: 100% 100% 99% 100%  Weight:      Height:       Exam Gen alert, no distress, on dialysis  No rash, cyanosis or gangrene Sclera anicteric, throat clear  No jvd or bruits Chest clear bilat to bases, no rales/ wheezing RRR no MRG Abd soft ntnd no mass or ascites +bs GU nl male MS no joint effusions or deformity Ext no LE edema, LUE 2+ edema Neuro  is alert, Ox 3 , nf    LUA AVF+bruit, RIJ TDC actively using      Renal-related home meds: - pending    OP HD: [facility]WF [facility] 4h   B400    89kg  2K bath  Heparin 5000  RIJ TDC/ L AVG   - mircera 150 mcg IV q 2 wks, last 2/21, due 3/07 - last 3 hb 11.1 on 2/4, 9.2 on 2/17 and 6.2 on 2/28 - hectorol 6 mcg three times per week   Assessment/ Plan: Symptomatic anemia - Hb 5.7, sp 1u prbc's. Further tranfusions per pmd as needed. Infiltration certainly contributing to Hb drop.  AVF infiltration - not using AVF for now, using TDC R neck.  Anemia of esrd - Hb has been dropping starting 2/17, was in the 11's prior. Will give esa here 3-4 days early and ^ to .  ESRD - on HD MWF. On HD now. Get wt's post HD.  HTN - max UF w/ HD today.  Volume - L arm swelling, mild LE edema. Secondary hyperparathyroidism - CCa and phos are in range. Cont binders w/ meals.       Vinson Moselle  MD [facility] 10/21/2023, 8:42 AM  Recent Labs  Lab 10/20/23 0941 10/21/23 0541  HGB 5.7* 7.1*  ALBUMIN 3.1* 2.9*  CALCIUM 7.8* 8.4*  PHOS  --  4.4  CREATININE 6.25* 5.87*  K 3.3* 3.8   Inpatient medications:  sodium chloride   Intravenous Once   amLODipine  10 mg Oral Daily   atorvastatin  40 mg Oral Daily   carvedilol  25 mg Oral Daily   Chlorhexidine  Gluconate Cloth  6 each Topical Daily   fluticasone furoate-vilanterol  1 puff Inhalation Daily   And   umeclidinium bromide  1 puff Inhalation Daily   gabapentin  100 mg Oral Once per day on Monday Wednesday Friday   irbesartan  150 mg Oral Daily   pantoprazole  40 mg Oral Daily   polyethylene glycol  17 g Oral Daily   sevelamer carbonate  800 mg Oral TID WC

## 2023-10-21 NOTE — Procedures (Signed)
 I was present at the procedure, reviewed the HD regimen and made appropriate changes.   Vinson Moselle MD  [facility] 10/21/2023, 4:57 PM

## 2023-10-21 NOTE — Progress Notes (Addendum)
 Subjective: George Day is a 48 year old gentleman with pertinent past medical history of ESRD on Monday Wednesday Friday HD, CAD, hypertension, peptic ulcer disease, HFpEF, and presumed MS presenting to the emergency department with 2-day history of arm pain after infiltration at dialysis as well as symptomatic anemia.  Today, hen is doing well. He denies any melena, hematochezia, vomiting. The LUE arm pain is improving and he denies sensation changes. He does not believe that he receives Erythropoietin injections at HD.   Objective:  Vital signs in last 24 hours: Vitals:   10/20/23 2018 10/21/23 0025 10/21/23 0454 10/21/23 0827  BP: (!) 168/88 132/81 (!) 155/66 (!) 164/95  Pulse: 81 80 76 77  Resp: 18 18 18 18   Temp: 98 F (36.7 C) 98.2 F (36.8 C) 98.1 F (36.7 C) 98.4 F (36.9 C)  TempSrc: Oral Oral Oral Oral  SpO2: 100% 100% 99% 100%  Weight:      Height:       Physical Exam: General:NAD, sitting in bed  Cardiac:RRR, no murmur appreciated  Pulmonary:normal effort on room air  Abdominal:soft, non-tender Neuro:awake, alert, participating in conversation  WGN:FAOZHYQMV LUE, LUE AVF thrill is palpable, motor function is intact  Skin:warm and dry  Psych:  normal mood and affect      Latest Ref Rng & Units 10/21/2023    5:41 AM 10/20/2023    9:41 AM 10/09/2023    9:14 AM  CBC  WBC 4.0 - 10.5 K/uL 5.6  6.8  7.3   Hemoglobin 13.0 - 17.0 g/dL 7.1  5.7  7.6   Hematocrit 39.0 - 52.0 % 23.4  19.1  24.1   Platelets 150 - 400 K/uL 259  156  212        Latest Ref Rng & Units 10/21/2023    5:41 AM 10/20/2023    9:41 AM 10/09/2023    9:14 AM  BMP  Glucose 70 - 99 mg/dL 88  94  83   BUN 6 - 20 mg/dL 34  34  61   Creatinine 0.61 - 1.24 mg/dL 7.84  6.96  2.95   BUN/Creat Ratio 9 - 20   11   Sodium 135 - 145 mmol/L 138  137  138   Potassium 3.5 - 5.1 mmol/L 3.8  3.3  4.1   Chloride 98 - 111 mmol/L 108  106  99   CO2 22 - 32 mmol/L 21  21  23    Calcium 8.9 - 10.3 mg/dL 8.4  7.8   9.2      Assessment/Plan:  Principal Problem:   Symptomatic anemia  Symptomatic anemia, acute on chronic Anemia of Chronic Kidney Disease  Hx PUD Hx of Iron Deficiency Anemia  No clear source of bleeding at this time. The anemia is normocytic which further supports the thought of low RBC production from ESRD causing the anemia. He remains hemodynamically stable and HBG is 7.1 s/p 1 unit.  Plan: -Will reach out to nephrology in regards to beginning Erythropoietin injections with HD.  -GI consulted and will consider video capsule study if overt GI bleeding or transfusion dependent.  -Trend CBC, transfuse for Hgb <7 -Oral PPI BID    Vascular access complication Vascular US of the LUE showed no pseudoaneurysm or perigraft fluid with significant interstitial edema. The upper extremity is less painful today and remains edematous. Vascular surgery evaluated the patient and there is no surgical intervention at this time.  Plan: -Continue conservative management -If worsens, evaluate.  Consider compartment syndrome -  Still has functional tunneled HD cath which we are using in the mean time  -Vascular US of AVF in "several weeks"    ESRD on MWF HD Has missed 2 HD sessions recently, was apparently to have make-up session yesterday but was not able to do this. Did go to HD on Friday. Iron studies completed 02/12 with elevated ferritin 458, decreased TIBC 241. Plan: -Consulted nephrology, appreciate their recommendations  -Continue home renvela 800 mg TID   HFrEF (LV EF 50-55% 01/2023) No acute concern for CHF exacerbation. BNP elevated to 357.4. Plan: -HD per nephro 10/21/2023   Hypertension With some missed doses particulary of his evening mediations. He is quite hypertensive on arrival.  Plan: -Resume home amlodipine 10 mg daily, carvedilol 25 mg daily (prescribed BID but only takes daily), irbesartan 150 mg daily   Type 2 diabetes mellitus Peripheral neuropathy No OP diabetes  medications particularly insulin. Plan: -Continue home gabapentin 100 mg three times weekly following HD for neuropathy   Hx asthma No signs of acute exacerbation. Plan: -Continue formulary equivalent of home trelegy   HLD Plan: -Continue home atorvastatin 40 mg daily   Peptic ulcer disease With recent admission for presumed upper GI bleed. -Oral PPI BID    Resolved Problems:  __________________________________  Code Status: Full  VTE Prophylaxis:SCDs Diet:Renal  IVF:N/A Barriers to Discharge:Medical treatment of Anemia  Dispo: Anticipated discharge in approximately 1-2 day(s).   Faith Rogue, DO 10/21/2023, 12:09 PM Pager: [phone] After 5pm on weekdays and 1pm on weekends: On Call pager [phone]

## 2023-10-21 NOTE — H&P (View-Only) (Signed)
 Renal Service Consult Note [name]  George Day 10/21/2023 George Krabbe, MD Requesting Physician: Dr. Antony Contras   Reason for Consult: ESRD pt w/  HPI: The patient is a 48 y.o. year-old w/ PMH as below who presented to ED for pain and swelling in his L arm after an infiltration during his last HD on Friday 2/28. He got 3-4 hrs of dialysis. He applied ice and over on Sat he was fine. In the am on Sunday 3/02, it was painful and swollen again. Some SOB as well. In ED Hb was 5.7 and BP's were moderately high at 180/105. Pt was admitted. We are asked to see for dialysis.    Pt seen up in HD unit. Feeling better today. SP 1u prbc's.  Hb up to 7.1.  Denies any recent volume issues. His TDC is still , they were going to take it out soon. Denies any SOB or vol overload.    PMH HFpEF Anemia Asthma CAD DM2 HTN Multiple sclerosis H/o CVA   ROS - denies CP, no joint pain, no HA, no blurry vision, no rash, no diarrhea, no nausea/ vomiting, no dysuria, no difficulty voiding   Past Medical History  Past Medical History:  Diagnosis Date   (HFpEF) heart failure with preserved ejection fraction (HCC)    Acute kidney injury superimposed on chronic kidney disease (HCC)    Acute respiratory distress 02/13/2023   Anemia 1998   Anginal pain (HCC)    Anxiety    Asthma    CKD (chronic kidney disease) stage 5, GFR less than 15 ml/min (HCC)    Coronary artery disease    Diabetes mellitus without complication (HCC) 1995   Hypertension 2019   MI (myocardial infarction) (HCC) 2010   Multiple sclerosis (HCC)    Sleep apnea    Stroke (HCC) 2010   Past Surgical History  Past Surgical History:  Procedure Laterality Date   AV FISTULA PLACEMENT Left 04/24/2023   Procedure: INSERTION OF LEFT ARM ARTERIOVENOUS (AV) GOR-TEX GRAFT;  Surgeon: Leonie Douglas, MD;  Location: [facility] OR;  Service: Vascular;  Laterality: Left;  with regional block   BIOPSY  08/18/2022   Procedure: BIOPSY;   Surgeon: Imogene Burn, MD;  Location: [facility] ENDOSCOPY;  Service: Gastroenterology;;   BIOPSY  10/03/2023   Procedure: BIOPSY;  Surgeon: Beverley Fiedler, MD;  Location: [facility] ENDOSCOPY;  Service: Gastroenterology;;   CARDIAC CATHETERIZATION  2010   COLONOSCOPY WITH PROPOFOL N/A 08/18/2022   Procedure: COLONOSCOPY WITH PROPOFOL;  Surgeon: Imogene Burn, MD;  Location: [facility] ENDOSCOPY;  Service: Gastroenterology;  Laterality: N/A;   ESOPHAGOGASTRODUODENOSCOPY (EGD) WITH PROPOFOL N/A 08/18/2022   Procedure: ESOPHAGOGASTRODUODENOSCOPY (EGD) WITH PROPOFOL;  Surgeon: Imogene Burn, MD;  Location: [facility] ENDOSCOPY;  Service: Gastroenterology;  Laterality: N/A;   ESOPHAGOGASTRODUODENOSCOPY (EGD) WITH PROPOFOL N/A 10/03/2023   Procedure: ESOPHAGOGASTRODUODENOSCOPY (EGD) WITH PROPOFOL;  Surgeon: Beverley Fiedler, MD;  Location: [facility] ENDOSCOPY;  Service: Gastroenterology;  Laterality: N/A;   IR FLUORO GUIDE CV LINE RIGHT  03/05/2023   IR US GUIDE VASC ACCESS RIGHT  03/05/2023   POLYPECTOMY  08/18/2022   Procedure: POLYPECTOMY;  Surgeon: Imogene Burn, MD;  Location: [facility] ENDOSCOPY;  Service: Gastroenterology;;   Family History  Family History  Problem Relation Age of Onset   Diabetes Mother    Hyperlipidemia Mother    Kidney disease Mother    Drug abuse Father    Diabetes Sister    Hyperlipidemia Sister    Hearing loss  Sister    Diabetes Brother    Hyperlipidemia Brother    Social History  reports that he quit smoking about 2 years ago. His smoking use included cigars, e-cigarettes, and cigarettes. He started smoking about 33 years ago. He has a 15.5 pack-year smoking history. He has never used smokeless tobacco. He reports that he does not currently use alcohol. He reports that he does not currently use drugs after having used the following drugs: Marijuana. Allergies  Allergies  Allergen Reactions   Honey Bee Venom [Bee Venom] Anaphylaxis   Penicillins Anaphylaxis    ALL   Tomato Swelling   Home  medications Prior to Admission medications   Medication Sig Start Date End Date Taking? Authorizing Provider  amLODipine (NORVASC) 10 MG tablet TAKE 1 TABLET BY MOUTH DAILY 06/25/23  Yes [name], MD  aspirin EC 81 MG tablet Take 1 tablet (81 mg total) by mouth daily. 11/21/22  Yes Ngetich, Dinah C, NP  atorvastatin (LIPITOR) 40 MG tablet TAKE 1 TABLET BY MOUTH DAILY 06/25/23  Yes [name], MD  carvedilol (COREG) 25 MG tablet TAKE TWO (2) TABLETS BY MOUTH TWICE DAILY Patient taking differently: Take 25 mg by mouth daily. 06/25/23  Yes [name], MD  EPINEPHrine 0.3 mg/0.3 mL IJ SOAJ injection Inject 0.3 mg into the muscle as needed for anaphylaxis.   Yes [provider]  famotidine (PEPCID) 20 MG tablet Take 20 mg by mouth daily. 10/07/23  Yes [provider]  gabapentin (NEURONTIN) 100 MG capsule Take 100mg , three times a week only following dialysis. 09/19/23  Yes [name], MD  nitroGLYCERIN (NITROSTAT) 0.6 MG SL tablet Place 1 tablet (0.6 mg total) under the tongue every 5 (five) minutes as needed for chest pain. 11/21/22  Yes Ngetich, Dinah C, NP  olmesartan (BENICAR) 20 MG tablet Take 1 tablet (20 mg total) by mouth at bedtime for high blood pressure. 04/04/23  Yes   pantoprazole (PROTONIX) 40 MG tablet Take 1 tablet (40 mg total) by mouth daily. 10/03/23  Yes Faith Rogue, DO  sevelamer carbonate (RENVELA) 800 MG tablet Take 1 tablet (800 mg total) by mouth 3 (three) times daily with meals. 04/08/23  Yes   TRELEGY ELLIPTA 100-62.5-25 MCG/ACT AEPB INHALE ONE (1) PUFF BY MOUTH DAILY Patient taking differently: Take 2 puffs by mouth daily. 09/10/23  Yes [name], MD  DULoxetine (CYMBALTA) 30 MG capsule Take 1 capsule (30 mg total) by mouth daily. Patient not taking: Reported on 10/02/2023 07/24/23   Levert Feinstein, MD  sevelamer carbonate (RENVELA) 800 MG tablet Take 1 tablet (800 mg total) by mouth 3 (three) times daily. Patient not taking:  Reported on 10/02/2023 09/24/23        Vitals:   10/20/23 2018 10/21/23 0025 10/21/23 0454 10/21/23 0827  BP: (!) 168/88 132/81 (!) 155/66 (!) 164/95  Pulse: 81 80 76 77  Resp: 18 18 18 18   Temp: 98 F (36.7 C) 98.2 F (36.8 C) 98.1 F (36.7 C) 98.4 F (36.9 C)  TempSrc: Oral Oral Oral Oral  SpO2: 100% 100% 99% 100%  Weight:      Height:       Exam Gen alert, no distress, on dialysis  No rash, cyanosis or gangrene Sclera anicteric, throat clear  No jvd or bruits Chest clear bilat to bases, no rales/ wheezing RRR no MRG Abd soft ntnd no mass or ascites +bs GU nl male MS no joint effusions or deformity Ext no LE edema, LUE 2+ edema Neuro  is alert, Ox 3 , nf    LUA AVF+bruit, RIJ TDC actively using      Renal-related home meds: - pending    OP HD: [facility] 4h   B400    89kg  2K bath  Heparin 5000  RIJ TDC/ L AVG   - mircera 150 mcg IV q 2 wks, last 2/21, due 3/07 - last 3 hb 11.1 on 2/4, 9.2 on 2/17 and 6.2 on 2/28 - hectorol 6 mcg three times per week   Assessment/ Plan: Symptomatic anemia - Hb 5.7, sp 1u prbc's. Further tranfusions per pmd as needed. Infiltration certainly contributing to Hb drop.  AVF infiltration - not using AVF for now, using TDC R neck.  Anemia of esrd - Hb has been dropping starting 2/17, was in the 11's prior. Will give esa here 3-4 days early and ^ to .  ESRD - on HD MWF. On HD now. Get wt's post HD.  HTN - max UF w/ HD today.  Volume - L arm swelling, mild LE edema. Secondary hyperparathyroidism - CCa and phos are in range. Cont binders w/ meals.       Vinson Moselle  MD [facility] 10/21/2023, 8:42 AM  Recent Labs  Lab 10/20/23 0941 10/21/23 0541  HGB 5.7* 7.1*  ALBUMIN 3.1* 2.9*  CALCIUM 7.8* 8.4*  PHOS  --  4.4  CREATININE 6.25* 5.87*  K 3.3* 3.8   Inpatient medications:  sodium chloride   Intravenous Once   amLODipine  10 mg Oral Daily   atorvastatin  40 mg Oral Daily   carvedilol  25 mg Oral Daily   Chlorhexidine  Gluconate Cloth  6 each Topical Daily   fluticasone furoate-vilanterol  1 puff Inhalation Daily   And   umeclidinium bromide  1 puff Inhalation Daily   gabapentin  100 mg Oral Once per day on Monday Wednesday Friday   irbesartan  150 mg Oral Daily   pantoprazole  40 mg Oral Daily   polyethylene glycol  17 g Oral Daily   sevelamer carbonate  800 mg Oral TID WC

## 2023-10-21 NOTE — Consult Note (Addendum)
 Consultation Note   Referring Provider:  [facility] PCP: [name], MD Primary Gastroenterologist: Imogene Burn, MD     Reason for Consultation:  DOA: 10/20/2023         Hospital Day: 2   Attending physician's note  I have taken a history, reviewed the chart and examined the patient. I performed a substantive portion of this encounter, including complete performance of at least one of the key components, in conjunction with the APP. I agree with the APP's note, impression and recommendations.   48 year old male with end-stage renal disease on hemodialysis, left upper arm graft on April 24, 2023, noted to have hematoma with significant upper arm swelling, possible pseudoaneurysm.  He was hospitalized with symptomatic anemia Iron deficient Denies any overt GI bleed No plan for repeat EGD, recently had unremarkable EGD last month. He is up-to-date with colorectal cancer screening  We can consider small bowel video capsule study to exclude occult chronic intermittent GI blood loss due to small bowel AVMs, tentatively plan for tomorrow if he is anticipated to remain hospitalized or can be done as outpatient  Hemoglobin improved more than anticipated to 1UPRBC transfusion  Monitor hemoglobin and transfuse as needed IV iron and darbepoetin per nephrology  The patient was provided an opportunity to ask questions and all were answered. The patient agreed with the plan and demonstrated an understanding of the instructions.  Iona Beard , MD [phone]      ASSESSMENT    Brief Narrative:  47 y.o. year old male with a past medical history not limited to  end-stage renal disease on dialysis, hypertension,  DM, iron deficiency anemia, duodenal ulcer.  Patient in hospital last month, seen by [place] for recurrent anemia and hematemesis. Readmitted yesterday for recurrent symptomatic anemia  Acute on chronic anemia / FOBT  negative stools this admission  Presenting hgb 5.7,  down from 7.6 last month.  He denies having dark stool since last hospital discharge in mid February and stools are heme negative so difficult to explain the drop in hgb. Could be intermittently bleeding from small bowel AVMs. Hgb up from 5.7 to 7.1 post one unit RBCs. Ferritin in 458, gets IV Fe at dialysis.   Non H.pylori related gastritis  CHF with preserved EF  ESRD on HD     PLAN:   --No plans for endoscopic evaluation today so continue diet.  Had EGD last month and last colonoscopy was less than 2 years ago. Will consider video capsule study if overt GI bleeding or transfusion dependent.  --Continue BID PPI   HPI   Patient seen by [place] during Feb 2025 admission for GI bleed. Inpatient EGD, as below. In summary found to have duodenitis and possibly a healed Mallory-weiss tea. At time of discharge his hgb was 7.6. He returns to ED yesterday with hgb of 5.7. He hasn't had any dark stools since last hospitalization. He takes a daily baby asa, no other NSAIDS. Takes daily pantoprazole. He has no abdominal pain , N/V or other GI complaints.    Previous GI Evaluations   Not a complete list of endoscopic procedures ( most recent only)  EGD 10/03/23 - Normal esophagus. - Normal stomach. Biopsied. - Duodenitis. Nonbleeding. -  Normal second portion of the duodenum. - Query healed Mallory-Weiss tear. A. STOMACH, BODY AND ANTRUM, BIOPSY:  Gastric mucosa with slight chronic inflammation and hyperemia.  Negative for Helicobacter pylori.   Colonoscopy and EGD Dec 2023 for hematochezia and IDA - Adequate prep - One 3 mm polyp in the cecum, removed with a cold snare. Resected and retrieved. - Non-bleeding internal hemorrhoids. EGD  Normal esophagus. Biopsies were taken with a cold forceps on the greater curvature of the gastric body, on the lesser curvature of the gastric body, at the incisura, on the greater curvature of the gastric antrum and  on the lesser curvature of the gastric antrum for histology. Localized inflammation characterized by congestion (edema), erythema, nodularity and shallow ulcerations was found in the duodenal bulb. Biopsies were taken with a cold forceps for histology.  A. STOMACH, BIOPSY: -  Antral and oxyntic mucosa with features of both mild to moderate chronic inactive gastritis and chemical/reactive change. -  An immunohistochemical stain for Helicobacter pylori organisms negative.  B. DUODENUM, BIOPSY: -  Duodenal mucosa with focal foveolar metaplasia and reactive/reparative change suggestive of chronic peptic duodenitis.  C. CECAL POLYP: -  Colonic mucosa with mild hyperplastic/serrated change with focal questionable crypt dilatation suggestive of a sessile serrated lesionwithout dysplasia with a prominent lymphoid aggregate. -  Multiple levels examined.   Recent Labs    10/20/23 0941 10/21/23 0541  WBC 6.8 5.6  HGB 5.7* 7.1*  HCT 19.1* 23.4*  PLT 156 259   Recent Labs    10/20/23 0941 10/21/23 0541  NA 137 138  K 3.3* 3.8  CL 106 108  CO2 21* 21*  GLUCOSE 94 88  BUN 34* 34*  CREATININE 6.25* 5.87*  CALCIUM 7.8* 8.4*   Recent Labs    10/20/23 0941 10/21/23 0541  PROT 6.3*  --   ALBUMIN 3.1* 2.9*  AST 22  --   ALT 18  --   ALKPHOS 82  --   BILITOT 0.8  --    No results for input(s): "LABPROT", "INR" in the last 72 hours.    Past Medical History:  Diagnosis Date   (HFpEF) heart failure with preserved ejection fraction (HCC)    Acute kidney injury superimposed on chronic kidney disease (HCC)    Acute respiratory distress 02/13/2023   Anemia 1998   Anginal pain (HCC)    Anxiety    Asthma    CKD (chronic kidney disease) stage 5, GFR less than 15 ml/min (HCC)    Coronary artery disease    Diabetes mellitus without complication (HCC) 1995   Hypertension 2019   MI (myocardial infarction) (HCC) 2010   Multiple sclerosis (HCC)    Sleep apnea    Stroke (HCC) 2010     Past Surgical History:  Procedure Laterality Date   AV FISTULA PLACEMENT Left 04/24/2023   Procedure: INSERTION OF LEFT ARM ARTERIOVENOUS (AV) GOR-TEX GRAFT;  Surgeon: Leonie Douglas, MD;  Location: [facility] OR;  Service: Vascular;  Laterality: Left;  with regional block   BIOPSY  08/18/2022   Procedure: BIOPSY;  Surgeon: Imogene Burn, MD;  Location: [facility] ENDOSCOPY;  Service: Gastroenterology;;   BIOPSY  10/03/2023   Procedure: BIOPSY;  Surgeon: Beverley Fiedler, MD;  Location: [facility] ENDOSCOPY;  Service: Gastroenterology;;   CARDIAC CATHETERIZATION  2010   COLONOSCOPY WITH PROPOFOL N/A 08/18/2022   Procedure: COLONOSCOPY WITH PROPOFOL;  Surgeon: Imogene Burn, MD;  Location: [facility] ENDOSCOPY;  Service: Gastroenterology;  Laterality: N/A;   ESOPHAGOGASTRODUODENOSCOPY (EGD)  WITH PROPOFOL N/A 08/18/2022   Procedure: ESOPHAGOGASTRODUODENOSCOPY (EGD) WITH PROPOFOL;  Surgeon: Imogene Burn, MD;  Location: [facility] ENDOSCOPY;  Service: Gastroenterology;  Laterality: N/A;   ESOPHAGOGASTRODUODENOSCOPY (EGD) WITH PROPOFOL N/A 10/03/2023   Procedure: ESOPHAGOGASTRODUODENOSCOPY (EGD) WITH PROPOFOL;  Surgeon: Beverley Fiedler, MD;  Location: [facility] ENDOSCOPY;  Service: Gastroenterology;  Laterality: N/A;   IR FLUORO GUIDE CV LINE RIGHT  03/05/2023   IR US GUIDE VASC ACCESS RIGHT  03/05/2023   POLYPECTOMY  08/18/2022   Procedure: POLYPECTOMY;  Surgeon: Imogene Burn, MD;  Location: [facility] ENDOSCOPY;  Service: Gastroenterology;;    Family History  Problem Relation Age of Onset   Diabetes Mother    Hyperlipidemia Mother    Kidney disease Mother    Drug abuse Father    Diabetes Sister    Hyperlipidemia Sister    Hearing loss Sister    Diabetes Brother    Hyperlipidemia Brother     Prior to Admission medications   Medication Sig Start Date End Date Taking? Authorizing Provider  amLODipine (NORVASC) 10 MG tablet TAKE 1 TABLET BY MOUTH DAILY 06/25/23  Yes [name], MD  aspirin EC 81 MG tablet Take 1 tablet (81 mg  total) by mouth daily. 11/21/22  Yes Ngetich, Dinah C, NP  atorvastatin (LIPITOR) 40 MG tablet TAKE 1 TABLET BY MOUTH DAILY 06/25/23  Yes [name], MD  carvedilol (COREG) 25 MG tablet TAKE TWO (2) TABLETS BY MOUTH TWICE DAILY Patient taking differently: Take 25 mg by mouth daily. 06/25/23  Yes [name], MD  EPINEPHrine 0.3 mg/0.3 mL IJ SOAJ injection Inject 0.3 mg into the muscle as needed for anaphylaxis.   Yes [provider]  famotidine (PEPCID) 20 MG tablet Take 20 mg by mouth daily. 10/07/23  Yes [provider]  gabapentin (NEURONTIN) 100 MG capsule Take 100mg , three times a week only following dialysis. 09/19/23  Yes [name], MD  nitroGLYCERIN (NITROSTAT) 0.6 MG SL tablet Place 1 tablet (0.6 mg total) under the tongue every 5 (five) minutes as needed for chest pain. 11/21/22  Yes Ngetich, Dinah C, NP  olmesartan (BENICAR) 20 MG tablet Take 1 tablet (20 mg total) by mouth at bedtime for high blood pressure. 04/04/23  Yes   pantoprazole (PROTONIX) 40 MG tablet Take 1 tablet (40 mg total) by mouth daily. 10/03/23  Yes Faith Rogue, DO  sevelamer carbonate (RENVELA) 800 MG tablet Take 1 tablet (800 mg total) by mouth 3 (three) times daily with meals. 04/08/23  Yes   TRELEGY ELLIPTA 100-62.5-25 MCG/ACT AEPB INHALE ONE (1) PUFF BY MOUTH DAILY Patient taking differently: Take 2 puffs by mouth daily. 09/10/23  Yes [name], MD  DULoxetine (CYMBALTA) 30 MG capsule Take 1 capsule (30 mg total) by mouth daily. Patient not taking: Reported on 10/02/2023 07/24/23   Levert Feinstein, MD  sevelamer carbonate (RENVELA) 800 MG tablet Take 1 tablet (800 mg total) by mouth 3 (three) times daily. Patient not taking: Reported on 10/02/2023 09/24/23       Current Facility-Administered Medications  Medication Dose Route Frequency Provider Last Rate Last Admin   0.9 %  sodium chloride infusion (Manually program via Guardrails IV Fluids)   Intravenous Once Halford Decamp,  PA-C       amLODipine (NORVASC) tablet 10 mg  10 mg Oral Daily Champ Mungo, DO   10 mg at 10/21/23 6962   atorvastatin (LIPITOR) tablet 40 mg  40 mg Oral Daily Champ Mungo, DO   40 mg at 10/21/23  5366   carvedilol (COREG) tablet 25 mg  25 mg Oral Daily Champ Mungo, DO   25 mg at 10/21/23 4403   Chlorhexidine Gluconate Cloth 2 % PADS 6 each  6 each Topical Daily Reymundo Poll, MD   6 each at 10/21/23 4742   Chlorhexidine Gluconate Cloth 2 % PADS 6 each  6 each Topical Q0600 Delano Metz, MD       fluticasone furoate-vilanterol (BREO ELLIPTA) 100-25 MCG/ACT 1 puff  1 puff Inhalation Daily Champ Mungo, DO   1 puff at 10/21/23 0725   And   umeclidinium bromide (INCRUSE ELLIPTA) 62.5 MCG/ACT 1 puff  1 puff Inhalation Daily Champ Mungo, DO   1 puff at 10/21/23 0725   gabapentin (NEURONTIN) capsule 100 mg  100 mg Oral Once per day on Monday Wednesday Friday Champ Mungo, DO   100 mg at 10/21/23 5956   irbesartan (AVAPRO) tablet 150 mg  150 mg Oral Daily Champ Mungo, DO   150 mg at 10/21/23 3875   pantoprazole (PROTONIX) EC tablet 40 mg  40 mg Oral Daily Champ Mungo, DO   40 mg at 10/21/23 6433   polyethylene glycol (MIRALAX / GLYCOLAX) packet 17 g  17 g Oral Daily Champ Mungo, DO   17 g at 10/21/23 2951   sevelamer carbonate (RENVELA) tablet 800 mg  800 mg Oral TID WC Champ Mungo, DO   800 mg at 10/21/23 8841    Allergies as of 10/20/2023 - Review Complete 10/20/2023  Allergen Reaction Noted   Honey bee venom [bee venom] Anaphylaxis 08/17/2022   Penicillins Anaphylaxis 01/05/2020   Tomato Swelling 08/08/2020    Social History   Socioeconomic History   Marital status: Married    Spouse name: Not on file   Number of children: 4   Years of education: Not on file   Highest education level: 10th grade  Occupational History   Occupation: Hydrologist  Tobacco Use   Smoking status: Former    Current packs/day: 0.00    Average packs/day: 0.5 packs/day for 31.0 years (15.5 ttl  pk-yrs)    Types: Cigars, E-cigarettes, Cigarettes    Start date: 09/1990    Quit date: 09/2021    Years since quitting: 2.0   Smokeless tobacco: Never  Vaping Use   Vaping status: Never Used  Substance and Sexual Activity   Alcohol use: Not Currently    Comment: occasioanlly    Drug use: Not Currently    Types: Marijuana    Comment: 2 x week   Sexual activity: Yes    Birth control/protection: None  Other Topics Concern   Not on file  Social History Narrative   Not on file   Social Drivers of Health   Financial Resource Strain: Medium Risk (09/20/2022)   Overall Financial Resource Strain (CARDIA)    Difficulty of Paying Living Expenses: Somewhat hard  Food Insecurity: Food Insecurity Present (10/20/2023)   Hunger Vital Sign    Worried About Running Out of Food in the Last Year: Often true    Ran Out of Food in the Last Year: Sometimes true  Transportation Needs: No Transportation Needs (10/20/2023)   PRAPARE - Administrator, Civil Service (Medical): No    Lack of Transportation (Non-Medical): No  Recent Concern: Transportation Needs - Unmet Transportation Needs (09/18/2023)   PRAPARE - Administrator, Civil Service (Medical): Yes    Lack of Transportation (Non-Medical): Yes  Physical Activity: Not on file  Stress: Not on file  Social Connections: Moderately Integrated (10/20/2023)   Social Connection and Isolation Panel [NHANES]    Frequency of Communication with Friends and Family: More than three times a week    Frequency of Social Gatherings with Friends and Family: Three times a week    Attends Religious Services: Never    Active Member of Clubs or Organizations: No    Attends Banker Meetings: 1 to 4 times per year    Marital Status: Married  Catering manager Violence: Not At Risk (10/20/2023)   Humiliation, Afraid, Rape, and Kick questionnaire    Fear of Current or Ex-Partner: No    Emotionally Abused: No    Physically Abused: No     Sexually Abused: No     Code Status   Code Status: Full Code  Review of Systems: All systems reviewed and negative except where noted in HPI.  Physical Exam: Vital signs in last 24 hours: Temp:  [98 F (36.7 C)-98.6 F (37 C)] 98.4 F (36.9 C) (03/03 0827) Pulse Rate:  [76-99] 77 (03/03 0827) Resp:  [16-20] 18 (03/03 0827) BP: (132-198)/(66-110) 164/95 (03/03 0827) SpO2:  [98 %-100 %] 100 % (03/03 0827) Last BM Date : 10/19/23  General:  Pleasant male in NAD Psych:  Cooperative. Normal mood and affect Eyes: Pupils equal Ears:  Normal auditory acuity Nose: No deformity, discharge or lesions Neck:  Supple, no masses felt Lungs:  Clear to auscultation.  Heart:  Regular rate, regular rhythm.  Abdomen:  Soft, nondistended, nontender, active bowel sounds, no masses felt Rectal :  Deferred Msk: Symmetrical without gross deformities.  Neurologic:  Alert, oriented, grossly normal neurologically Extremities : No edema Skin:  Intact without significant lesions.    Intake/Output from previous day: 03/02 0701 - 03/03 0700 In: 480 [P.O.:480] Out: -  Intake/Output this shift:  Total I/O In: 240 [P.O.:240] Out: -    Willette Cluster, NP-C   10/21/2023, 9:44 AM

## 2023-10-21 NOTE — Plan of Care (Signed)

## 2023-10-21 NOTE — Progress Notes (Signed)
 Pt receives out-pt HD at [facility] on MWF with 11:55 am chair time. Will assist as needed.   Olivia Canter Renal Navigator [phone]

## 2023-10-21 NOTE — Progress Notes (Signed)
 Blood transfusion completed in ED ,no Hemglobin and hematocrit ordered. Lab came this morning between  5am to 6am cannot draw blood due to hard stick.

## 2023-10-21 NOTE — Progress Notes (Signed)
   10/21/23 1750  Vitals  Pulse Rate 78  Resp (!) 26  BP (!) 152/91  SpO2 99 %  O2 Device Room Air  Weight 93.6 kg  Type of Weight Post-Dialysis  Oxygen Therapy  Patient Activity (if Appropriate) In bed  Pulse Oximetry Type Continuous

## 2023-10-21 NOTE — Progress Notes (Signed)
   10/21/23 1745  Vitals  Temp 99.1 F (37.3 C)  Pulse Rate 77 (Simultaneous filing. User may not have seen previous data.)  Resp (!) 23 (Simultaneous filing. User may not have seen previous data.)  BP (!) 160/90 (Simultaneous filing. User may not have seen previous data.)  SpO2 100 % (Simultaneous filing. User may not have seen previous data.)  O2 Device Room Air  Type of Weight Post-Dialysis  Oxygen Therapy  Patient Activity (if Appropriate) In bed  Pulse Oximetry Type Continuous   Received patient in bed to unit.  Alert and oriented.  Informed consent signed and in chart.   TX duration:3.5  Patient tolerated well.  Transported back to the room  Alert, without acute distress.  Hand-off given to patient's nurse.   Access used: Yes Access issues: No  Total UF removed: 3500 Medication(s) given: See MAR Post HD VS: See Above Grid Post HD weight: 93.6 kg   [facility]

## 2023-10-22 ENCOUNTER — Encounter (HOSPITAL_COMMUNITY)
Admission: EM | Disposition: A | Discharge status: Home / Self Care | Payer: Self-pay | Source: Home / Self Care | Attending: Emergency Medicine

## 2023-10-22 DIAGNOSIS — D509 Iron deficiency anemia, unspecified: Secondary | ICD-10-CM | POA: Diagnosis not present

## 2023-10-22 DIAGNOSIS — D649 Anemia, unspecified: Secondary | ICD-10-CM | POA: Diagnosis not present

## 2023-10-22 LAB — RENAL FUNCTION PANEL
Albumin: 2.9 g/dL — ABNORMAL LOW (ref 3.5–5.0)
Anion gap: 10 (ref 5–15)
BUN: 20 mg/dL (ref 6–20)
CO2: 25 mmol/L (ref 22–32)
Calcium: 8.4 mg/dL — ABNORMAL LOW (ref 8.9–10.3)
Chloride: 103 mmol/L (ref 98–111)
Creatinine, Ser: 4.69 mg/dL — ABNORMAL HIGH (ref 0.61–1.24)
GFR, Estimated: 15 mL/min — ABNORMAL LOW (ref >60)
Glucose, Bld: 88 mg/dL (ref 70–99)
Phosphorus: 4.2 mg/dL (ref 2.5–4.6)
Potassium: 3.5 mmol/L (ref 3.5–5.1)
Sodium: 138 mmol/L (ref 135–145)

## 2023-10-22 LAB — CBC
HCT: 24 % — ABNORMAL LOW (ref 39.0–52.0)
Hemoglobin: 7.7 g/dL — ABNORMAL LOW (ref 13.0–17.0)
MCH: 26.3 pg (ref 26.0–34.0)
MCHC: 32.1 g/dL (ref 30.0–36.0)
MCV: 81.9 fL (ref 80.0–100.0)
Platelets: 269 10*3/uL (ref 150–400)
RBC: 2.93 MIL/uL — ABNORMAL LOW (ref 4.22–5.81)
RDW: 20.7 % — ABNORMAL HIGH (ref 11.5–15.5)
WBC: 5 10*3/uL (ref 4.0–10.5)
nRBC: 0 % (ref 0.0–0.2)

## 2023-10-22 LAB — MAGNESIUM: Magnesium: 2 mg/dL (ref 1.7–2.4)

## 2023-10-22 SURGERY — IMAGING PROCEDURE, GI TRACT, INTRALUMINAL, VIA CAPSULE
Anesthesia: LOCAL

## 2023-10-22 SURGICAL SUPPLY — 1 items: TOWEL COTTON PACK 4EA (MISCELLANEOUS) ×2 IMPLANT

## 2023-10-22 NOTE — Discharge Instructions (Signed)
 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  We treated you with a blood transfusion.    *For your AVF (fistula)  -You will need to have an ultrasound of your fistula once the swelling improves.  -Please see your vascular surgeon in 2-4 weeks -If you have an increase in LUE pain, changes in sensation, worse swelling, have a change in temperature in the left hand: please seek care at the emergency department immediatly   *For your anemia -The stomach doctors (GI) will call you with the results of the capsule and offer their recommendations at that time.   Continue taking your medications as previously prescribed   If you have any questions or concerns please feel free to call: Internal medicine clinic at [phone]  Please go to your PCP's office for an appointment on: March 13th at 09:15  If you have any of these following symptoms, please call us or seek care at an emergency department: -Chest Pain -Difficulty Breathing -Worsening arm  pain -Sensation changes in the left arm or weakness of the left arm  -Syncope (passing out) -Drooping of face -Slurred speech -Sudden weakness in your leg or arm -Fever -Chills   We are glad that you are feeling better, it was a pleasure to care for you!  Faith Rogue DO     [facility] assistance programs. If you are behind on your bills and expenses, and need some help to make it through a short term hardship or financial emergency, there are several organizations and charities in the [place] area that may be able to help. They range from the Pathmark Stores, Liberty Global, Landscape architect of Weyerhaeuser Company and the local community action agency, the Intel, Avnet. These groups may be able to provide you resources to help pay your utility bills, rent, and they even offer housing assistance.  Crisis assistance  program Find help for paying your rent, electric bills, free food, and even funds to pay your mortgage. The Liberty Global 920-449-8451) offers several services to local families, as funding allows. The Emergency Assistance Program (EAP), which they administer, provides household goods, free food, clothing, and financial aid to people in need in the [facility] area. The EAP program does have some qualification, and counselors will interview clients for financial assistance by written referral only. Referrals need to be made by the Department of Social Services or by other EAP approved human services agencies or charities in the area.  Money for resources for emergency assistance are available for security deposits for rent, water, electric, and gas, past due rent, utility bills, past due mortgage payments, food, and clothing. The Liberty Global also operates a Programme researcher, broadcasting/film/video on the site. More Liberty Global.  Open Door Ministries of [employer], which can be reached at [phone], offers emergency assistance programs for those in need of help, such as food, rent assistance, a soup kitchen, shelter, and clothing. They are based in [facility] but provide a number of services to those that qualify for assistance. Continue with Open Door Ministries programs.  [facility] Department of Social Services may be able to offer temporary financial assistance and cash grants for paying rent and utilities. Help may be provided for local county residents who may be experiencing personal crisis when other resources, including government programs, are not available. Call [phone]  St. Northwest Airlines, which is based in [place], provides financial assistance of up to $50.00 to help pay for rent, utilities, cooling bills, rent, and prescription medications. The program also provides secondhand furniture to those in need. [phone]  Mattel is a Geneticist, molecular. The organization can offer emergency assistance for paying rent, electric bills, utilities, food, household products and furniture. They offer extensive emergency and transitional housing for families, children and single women, and also run a Boy's and Dole Food. 301 Thrift Shops, CMS Energy Corporation, and other aid offered too. [address] [place], [place] [address], [phone]  Additional locations of the [employer] Stores are in [place] and other nearby communities. When you have an emergency, need free food, money for basic needs, or just need assistance around Christmas, then the Path[employer] Stores may have the resources you need. Or they can refer you to nearby agencies. Learn more.  [place] Low Income Risk manager - This is offered for [facility] families. The federal government created CIT Group Program provides a one-time cash grant payment to help eligible low-income families pay their electric and heating bills. [address] [place], [place] [address], [phone]  Government and Motorola - The county administers several emergency and self-sufficiency programs. Residents of [place] can get help with energy bills and food, rent, and other expenses. In addition, work with a Sports coach who may be able to help you find a job or improve your employment skills. More [place] public assistance.  [place] Emergency Assistance - A program offers emergency utility and rent funds for greater [employer] area residents. The program can also provide counseling and referrals to charities and government programs. Also provides food and a free meal program that serves lunch Mondays - Saturdays and dinner seven days per week to individuals in the community. [address] [place], [place] [address], [phone]  United Auto - Offers affordable apartment and housing communities across [place] and [place]. The low income and seniors can access public housing, rental assistance to qualified applicants, and apply for the section 8 rent subsidy program. Other programs include Chiropractor and Engineer, maintenance. [address] [place], [place] [address], dial [phone].  Basic needs such as clothing - Low income families can receive free items (school supplies, clothes, holiday assistance, etc.) from clothing closets while more moderate income [address] families can shop at [employer]. Locations across the area help the needy. Get information on [place] Triad free clothing centers.  The [facility] provides transitional housing to veterans and the disabled. Clients will also access other services too, including life skills classes, case management, and assistance in finding permanent housing. [address] [place], [place] [address], call [phone]  [place] Transitional Housing in [place] is for people who were just evicted or that are formerly homeless. The non-profit will also help then gain self-sufficiency, find a home or apartment to live in, and also provides information on rent assistance when needed. Dial [phone]  [employer] Partnership to End Homelessness is available in [place]. Families that were evicted or that are homeless can gain shelter, food, clothing, furniture, and also emergency financial assistance. Other services include financial skills and life skills coaching, job training, and case management. [address] [place], [place] [address]. Telephone [phone].  The Dynegy, Avnet. runs  the [employer] Motor Company. This can help people save money on their heating and summer cooling bills, and is free to low income families. Free upgrades can be made to  your home. Phone [phone]  Many of the non-profits and programs mentioned above are all inclusive, meaning they can meet many needs of the low income, such as energy bills, food, rent, and more. However there are several organizations that focus just on rent and housing. Read more on rent assistance in [place] region.  Legal assistance for evictions, foreclosure, and more If you need free legal advice on civili issues, such as foreclosures, evictions, Electronics engineer, government programs, domestic issues and more, Armed forces operational officer Aid of [facility]) is a Associate Professor firm that provides free legal services and counsel to lower income people, seniors, disabled, and others. The goal is to ensure everyone has access to justice and fair representation.  Call them at [phone], or click here to learn more about [place] free legal assistance programs.  [place] Avnet and funds for emergency expenses The Pathmark Stores is another organization that can provide people with Deere & Company and funds to pay bills. Their assistance depends on funding, and the demand for help is always very high. They can provide cash to help pay rent, a missed mortgage payment, or gas, electric, and water bills. But the assistance doesn't stop there. They also have a food pantry on site, which can provide food once every three (3) months to people who need help. The KeyCorp can also offer a Engineering geologist once every three (3) months for a maximum three (3) times. After receiving this voucher over that period of time, applicants can receive this aid one every six (6) months after that. [phone].  Kohl's action agency The Intel, Avnet. offers job and Dispensing optician. Resources are focused on helping students obtain the skills and experiences that are necessary to compete in today's challenging and tight job market. The non-profit  faith-based community action agency offers internship trainings as well as classroom instruction. Economically disadvantaged and challenged individuals and potential employers can use their services. Classes are tailored to meet the needs of people in the [facility] region. [place], [place] [address], [phone]    Foreclosure prevention services Housing Counseling and Education is also offered by [employer]vaco of the [place]. The agency (phone number is below) is a Engineer, structural providing foreclosure advice and counseling. They offer mortgage resolution counseling and also reverse mortgage counseling. Counselors can direct people to both Kimberly-Clark, as well as Weyerhaeuser Company foreclosure assistance options.  Warehouse manager has locations in [place] and [place]. They run debt and foreclosure prevention programs for local families. A sampling of the programs offered include both Budget and Housing Counseling. This includes money management, financial advice, budget review and development of a written action plan with a Pensions consultant to help solve specific individual financial problems. In addition, housing and mortgage counselors can also provide pre- and post-purchase homeownership counseling, default resolution counseling (to prevent foreclosure) and reverse mortgage counseling. A Debt Management Program allows people and families with a high level of credit card or medical debt to consolidate and repay consumer debt and loans to creditors and rebuild positive credit ratings and scores. [phone]  Debt assistance programs Receive free counseling and debt help from [facility] of the [place]. The [place] based  agency can be reached at [phone]. The counselors provide free help, and the services include budget counseling. This will help people  manage their expenses and set goals. They also offer a Forensic scientist, which will help individuals consolidate their debts and become debt free. Most of the workshops and services are free.  Community clinics in [place] Five of the leading health and dental centers are listed below. They may be able to provide medication, physicals, dental care, and general family care to residents of all incomes and backgrounds across the region. Some of the programs focus on the low income and underinsured. However if these clinics can't meet your needs, find information and details on more clinics in [facility].  Some of the options include Marriott of [employer]. This center provides free or low cost health care to low-income adults 18 - 64, who have no health insurance. Among other services offered include a pharmacy and eye clinic. Phone [phone]  [facility], which is located in [place], is a community clinic that provides primary medical and health care to uninsured and underinsured adults and families, as well as the low income, in the greater [place] area on a sliding-fee scale. Call [phone]  [place] Adult Dental Program - They run a dental assistance program that is organized by [facility] to provide dental services and aid to [employer]. Services offered by the dental clinic are limited to extractions, pain management, and minor restorative care. [phone]  [place] [facility] Health has locations in [facility] and [place]. The community clinics provide complete pediatric care including primary health, mental health, social work, neurology, cardiology, asthma. Dial [phone].  In addition to those [address] and Safeway Inc, find other free community clinics in [place] and across the county.  Food pantry and assistance Some of the local food pantries and  distribution centers to call for free food and groceries include The Hive of [place] (phone [phone]), The [facility] (phone [phone]) and also PPL Corporation. Dial [phone].  Several other food banks in the region provide clothing, free food and meals, access to soup kitchens and other help. Find the addresses and phone numbers of more food pantries in [place]. http://www.needhelppayingbills.com/html/[place]_assistance_pro.html  SUNDAYS BREAKFAST TWO LOCATIONS: 8:00am served in Nucor Corporation by Awaken PPL Corporation 8:30am SHUTTLE provided from [facility], served at Apache Corporation, [address] LUNCH TWO LOCATIONS [plus one additional third Sunday only] 10:30am - 12:30pm served at Ecolab, Liberty Global, [place] [address] (1.2 miles from [facility]) 12:30pm served in [place] by Land O'Lakes Team (THIRD Sunday only) 1:30pm served at [facility] by [facility] one location [plus one additional third Sunday only] 5:00pm Every Sunday, served under the bridge at [address] by Lindell Noe Under the 3M Company (.7 miles from [facility]) (THIRD Sunday ONLY) 4:00pm served in the parking garage, across from Nucor Corporation, corner of [place] and [place] by Ryland Group Works Ministries MONDAYS BREAKFAST 7:30am served in [employer]ucor Corporation by the United States Steel Corporation and Friends LUNCH 10:30am - 12:30pm served at Ecolab, Liberty Global, [place] [address] (1.2 miles from [facility]) DINNER TWO LOCATIONS: 7:00pm served in front of the courthouse at the corner of Goldman Sachs and International Business Machines. by [facility] Monday Night Meal (3 blocks from [facility]  Park) 4:30pm served at the AutoNation, [address] by The Procter & Gamble Not Bombs (0.6 miles from [facility]) [place] BREAKFAST 8:00am - 9:00am served at CenterPoint Energy, [address] (0.3 miles from [place]) LUNCH 10:30am - 12:30pm served at the Ecolab, Liberty Global [address], (1.2 miles from [place]) DINNER 6:00pm served at CSX Corporation, enter from Capital One and go to the Sonic Automotive, (0.7 miles from [place]) [facility] BREAKFAST 7:00am - 8:00am served at Ecolab, Liberty Global [address] (1.2 miles from [place]) LUNCH ONE LOCATION [plus two additional locations listed below] 10:30am - 12:30pm served at Ecolab, Liberty Global [address], (1.2 miles from [place]) (FIRST Wednesday ONLY) 11:30am served at Dillard's, [place] [address] (6.6 miles from [place]) (SECOND Wednesday ONLY) 11:00am served at [place]. Melvyn Novas of [address], [address] (1.3 miles from [place]) [place] TWO LOCATIONS 6:00pm served at W. R. Berkley, [place] W. Visteon Corporation. (1.3 miles from [facility]) 4:00pm - 6:00pm (hot dogs and chips) served at Levi Strauss of [facility], [address] (1.7 miles from [place]) [place] BREAKFAST NOT AVAILABLE AT THIS TIME LUNCH 10:30am - 12:30pm served at Ecolab, Liberty Global, [place] [address], (1.2 miles from [place]) DINNER 6:00pm served at CSX Corporation, enter from Capital One and go to the Sonic Automotive, (0.7 miles from Nucor Corporation) [place] BREAKFAST NOT AVAILABLE AT THIS TIME LUNCH 10:30am - 12:30pm served at Ecolab, Liberty Global [address], (1.2 miles from [place]) DINNER TWO LOCATIONS, [plus one additional first Friday only] 6:00pm served under the bridge at [address] by Lindell Noe Under CSX Corporation. (.7 miles from [facility]) 5:00pm - 7:00pm served at SUPERVALU INC of [facility], [address] (1.7 miles from [place]) (FIRST Friday ONLY) 5:45 pm - SHUTTLE provided from the LIBRARY at 5:45pm. Served at [facility], [address]. SATURDAYS BREAKFAST TWO LOCATIONS [plus one additional last Saturday only] 8:00am served at [facility] by Delphi 8:30am served at Pulte Homes, [address]. (2.2 miles from [facility]) (LAST Saturday ONLY) 8:30am served at Beazer Homes, [address] (5 miles from [place]) LUNCH 10:30am - 12:30pm served at Ecolab, Liberty Global [address] (1.2 miles from [facility]) DINNER 6:00pm served under the bridge at [address] by World Fuel Services Corporation (0.7 miles from Nucor Corporation)  DIRECTIONS FROM [place] TO ALL MEAL LOCATIONS The Bridge at [address] (.7 miles from [address]) [address] on [place]. Turn Right onto DIRECTV 433 ft. Continue onto [address] under bridge, about 500 ft. Courthouse (3 blocks from [facility]) [place] on [address]. Turn right on [place] 1 block to PPL Corporation (.5 miles from [place]) [place] on [place]. YRC Worldwide. past Brink's Company to EMCOR. Enter from Capital One and go to the Affiliated Computer Services building W. R. Berkley [address] Visteon Corporation. (1.3 miles from [facility]) [address] on [place]. Turn Right onto W.  Visteon Corporation. church will be on the Left. The TJX Companies [address] (.3 miles from [facility]) Go .3 miles on W. Friendly Destination is on your right Dillard's at ONEOK (6.6 miles from [address]) [address] on [place] toward W Friendly Turn right onto W Friend[address] Continue onto Alcoa Inc. Continue onto Toll Brothers. 5. Elesa Hacker is on right [facility] Wal-Mart) [address] (.6 miles from [address]) [place] on [place]. [address] Turn Left onto [address] 0.3 miles Destination is on the Left. [place] Black & Decker at American Express (5 miles from Nucor Corporation) 1. Head south on [address]. Turn right onto [address] Friend[address] Turn slightly left onto Quest Diagnostics Continue onto Quest Diagnostics Turn right at Barnes & Noble Continue to church on right New Birth Sounds of [facility] [address] (1.7 miles from [place]) [address] on [place] 1.4 miles [place] becomes [place]. [address] Continue 0.6 miles and church will be on theright. North[place] Guardian Life Insurance at [address] (2.5 miles from Nucor Corporation) [place] provided from Massachusetts Mutual Life Park] [place] on [place]. Elm toward Estée Lauder right onto Costco Wholesale left onto Henry Schein left onto Micron Technology [address] Southern Company (2.2 miles from [address]) [address] on [place] 1.4 miles [place] becomes [place]. Elm [address] Turn right onto [address]. [address] and church will be on the Left. Potter's House/[place] AT&T [address] (1.2 miles from [facility]) 1.Turn right onto [facility] 2.Turn left onto Rogue Jury 3.Reino Kent 4.Destination is on your right [place]. Melvyn Novas of [address] at ToysRus (1.3 miles from [facility]) [address] on [address] Turn left onto Genuine Parts right onto S. [place]re. Continue onto KB Home	[place]. Turn left onto Smurfit-Stone Container. Turn right onto WellPoint.  Low Income Public Housing  [facility] Housing Authority [place] Supported Living Apts [phone] W. Joellyn Qu[address]., [facility] [phone]  [facility] [address], [place] [phone]  [facility] [address] [place] [phone]  [facility] [address] [place]. [place] [phone]  [place] Apts. [address]  [place] [phone]  [place]ks[place] Apts.  [address], [place] [phone]  Dorothe Pea Homes [address] [place] [phone]  [facility] [address] Silvio Pate [phone]  [facility] [address] [place] [phone]  THP Apts. [address]  [facility] [phone]  [facility]  [address] [place] [phone]  [place] Apts.  [address] Ginette Otto [phone]   [place] Apts. II [address] [place] Otto [phone]  [place] Housing [address] [place] Otto [phone] [address] [place] [phone]  [facility] Apts. [address], [place] Otto [phone]  [place] Apts. [address] [place] Otto [phone]  [facility] Apts. [address] [place] [phone]  Rockwell Automation Apts.  [address] [place] [phone]  [facility] Apts. [address], [place] [phone] Apts.  [address] [phone]  [facility] Apts. [address] [place] [phone]  [place] Apts.  [address] [facility] [phone]

## 2023-10-22 NOTE — Plan of Care (Signed)

## 2023-10-22 NOTE — Care Management Obs Status (Signed)
 MEDICARE OBSERVATION STATUS NOTIFICATION   Patient Details  Name: George Day MRN: [id number] Date of Birth: 1976-06-15   Medicare Observation Status Notification Given:   (currently in HD)    Kingsley Plan, RN 10/22/2023, 8:45 AM

## 2023-10-22 NOTE — Progress Notes (Signed)
   10/22/23 0850  SDOH Interventions  Utilities Interventions Inpatient TOC   Resources placed on AVS

## 2023-10-22 NOTE — Progress Notes (Signed)
 Givens capsule endoscopy ordered by MD Nandigam.  Patient ingested capsule at 0826.  Per Given's capsule instructions, patient to remain NPO until 1026 at which time they may progress to clear liquid diet. At 1226 patient may have a small snack such as a half a sandwich or a bowl of soup. At 1626 patient may progress to previously ordered diet.  The capsule endoscopy study will conclude at 2026 at which time the recorder and leads or belt can be removed and placed in a patient belongings bag. Endoscopy staff will pick up the equipment in the AM.  Instructions provided to patient and inpatient RN. Patient and RN demonstrated understanding.

## 2023-10-22 NOTE — TOC Initial Note (Signed)
 Transition of Care (TOC) - Initial/Assessment Note   Patient from home with wife.   Patient asking for a cane. Secure chatted MD, they will have PT see patient. Await recommendations.  Patient Details  Name: George Day MRN: [id number] Date of Birth: 01/31/1976  Transition of Care [facility]) CM/SW Contact:    Kingsley Plan, RN Phone Number: 10/22/2023, 9:01 AM  Clinical Narrative:                   Expected Discharge Plan:  (await PT evaluation) Barriers to Discharge: Continued Medical Work up   Patient Goals and CMS Choice Patient states their goals for this hospitalization and ongoing recovery are:: to return to home          Expected Discharge Plan and Services In-house Referral: NA Discharge Planning Services: CM Consult Post Acute Care Choice: NA Living arrangements for the past 2 months: Single Family Home                 DME Arranged: N/A DME Agency: NA       HH Arranged: NA HH Agency: NA        Prior Living Arrangements/Services Living arrangements for the past 2 months: Single Family Home Lives with:: Spouse Patient language and need for interpreter reviewed:: Yes Do you feel safe going back to the place where you live?: Yes      Need for Family Participation in Patient Care: No (Comment) Care giver support system in place?: Yes (comment)   Criminal Activity/Legal Involvement Pertinent to Current Situation/Hospitalization: No - Comment as needed  Activities of Daily Living   ADL Screening (condition at time of admission) Independently performs ADLs?: Yes (appropriate for developmental age) Is the patient deaf or have difficulty hearing?: No Does the patient have difficulty seeing, even when wearing glasses/contacts?: No Does the patient have difficulty concentrating, remembering, or making decisions?: No  Permission Sought/Granted   Permission granted to share information with : No              Emotional Assessment Appearance:: Appears  stated age Attitude/Demeanor/Rapport: Engaged Affect (typically observed): Appropriate Orientation: : Oriented to Self, Oriented to Place, Oriented to  Time, Oriented to Situation Alcohol / Substance Use: Not Applicable Psych Involvement: No (comment)  Admission diagnosis:  Left upper arm pain [M79.622] Symptomatic anemia [D64.9] Anemia, unspecified type [D64.9] Patient Active Problem List   Diagnosis Date Noted   Anemia of chronic disease 10/21/2023   Peptic ulcer disease 10/21/2023   Symptomatic anemia 10/20/2023   GI bleed 10/02/2023   History of anaphylaxis 09/27/2023   Peripheral neuropathy 09/19/2023   ESRD on dialysis (HCC) 09/19/2023   Complex care coordination 09/19/2023   Paresthesia 07/24/2023   Gait abnormality 06/13/2023   Multiple sclerosis (HCC) 03/04/2023   (HFpEF) heart failure with preserved ejection fraction (HCC) 03/04/2023   Chronic kidney disease (CKD), stage V (HCC) 03/03/2023   Chronic diastolic (congestive) heart failure (HCC) 02/13/2023   Elevated troponin 02/13/2023   COPD (chronic obstructive pulmonary disease) (HCC) 02/13/2023   Chronic diastolic heart failure (HCC) 10/12/2022   Chronic focal neurological deficit 10/12/2022   Hypervolemia 09/17/2022   Anasarca 09/17/2022   Hematochezia 08/18/2022   Pericardial effusion 08/17/2022   Coronary artery disease 08/17/2022   Homelessness 08/16/2022   Hypertensive urgency 02/19/2021   Hypokalemia    Accelerated hypertension 02/16/2021   Right adrenal mass (HCC) 11/28/2020   Anemia 08/31/2020   Former smoker 08/29/2020   Hyperlipidemia associated with type 2 diabetes  mellitus (HCC) 08/29/2020   Iron deficiency anemia 08/29/2020   Influenza vaccination declined 08/29/2020   23-polyvalent pneumococcal polysaccharide vaccine declined 08/29/2020   Chest pain 08/08/2020   GAD (generalized anxiety disorder) 02/25/2020   Hyperlipidemia 02/25/2020   ESRD (end stage renal disease) on dialysis (HCC)  02/25/2020   Diabetes mellitus (HCC) 01/05/2020   Essential hypertension 01/05/2020   Tobacco dependence 01/05/2020   Coronary artery disease involving native coronary artery of native heart without angina pectoris 01/05/2020   History of CVA with residual deficit 01/05/2020   Moderate persistent asthma without complication 01/05/2020   Class 1 obesity due to excess calories with serious comorbidity and body mass index (BMI) of 32.0 to 32.9 in adult 01/05/2020   PCP:  [name], MD Pharmacy:   [facility] [address], [address] [place] [address] Phone: [phone] Fax: [phone]  [facility] - [address] [place] - [address] [place] [address] Phone: [phone] Fax: [phone]  [facility] [address] [place] - [address] [address] [place] [address] Phone: [phone] Fax: [phone]     Social Drivers of Health (SDOH) Social History: SDOH Screenings   Food Insecurity: Food Insecurity Present (10/20/2023)  Housing: High Risk (10/20/2023)  Transportation Needs: No Transportation Needs (10/20/2023)  Recent Concern: Transportation Needs - Unmet Transportation Needs (09/18/2023)  Utilities: At Risk (10/20/2023)  Alcohol Screen: Low Risk  (09/20/2022)  Depression (PHQ2-9): Low Risk  (09/19/2023)  Financial Resource Strain: Medium Risk (09/20/2022)  Social Connections: Moderately Integrated (10/20/2023)  Tobacco Use: Medium Risk (10/20/2023)   SDOH Interventions: Food Insecurity Interventions: Inpatient TOC Housing Interventions: Inpatient TOC Utilities Interventions: Inpatient TOC   Readmission Risk Interventions    10/03/2023    2:11 PM 03/04/2023    3:23 PM  Readmission Risk Prevention Plan  Transportation Screening Complete Complete  PCP or Specialist Appt within 5-7 Days Complete   PCP or Specialist Appt within 3-5 Days  Complete  Home Care  Screening Complete   Medication Review (RN CM) Referral to Pharmacy   HRI or Home Care Consult  Complete  Social Work Consult for Recovery Care Planning/Counseling  Complete  Palliative Care Screening  Not Applicable  Medication Review Oceanographer)  Referral to Pharmacy

## 2023-10-22 NOTE — Progress Notes (Signed)
 Subjective: George Day is a 48 year old gentleman with pertinent past medical history of ESRD on Monday Wednesday Friday HD, CAD, hypertension, peptic ulcer disease, HFpEF, and presumed MS presenting to the emergency department with 2-day history of arm pain after infiltration at dialysis as well as symptomatic anemia.  Today, he denied evidence of bleeding. He had a BM this morning which was brown. His LUE pain is improving as well. We discussed PT evaluation for a can or additional equipment due to RLE "dragging" from the MS.   Objective:  Vital signs in last 24 hours: Vitals:   10/21/23 2004 10/22/23 0200 10/22/23 0509 10/22/23 0806  BP: 139/73 137/83 138/79 (!) 156/85  Pulse: 80 73 75 74  Resp:  18  17  Temp: 98.9 F (37.2 C) 97.8 F (36.6 C) 99 F (37.2 C) 98.6 F (37 C)  TempSrc: Oral Oral Oral Oral  SpO2: 97% 98% 99% 100%  Weight:      Height:       Physical Exam: General:NAD Cardiac:RRR, LUE AVF palpable thrill with auscultated bruit  Pulmonary:normal effort on room air  Abdominal:soft, bowel sounds present  Neuro:awake, alert, participating in conversation  MSK: LUE is less edematous today, motor function of LUR intact  Skin:warm and dry  Psych:  normal mood and affect      Latest Ref Rng & Units 10/22/2023    7:40 AM 10/21/2023    5:41 AM 10/20/2023    9:41 AM  CBC  WBC 4.0 - 10.5 K/uL 5.0  5.6  6.8   Hemoglobin 13.0 - 17.0 g/dL 7.7  7.1  5.7   Hematocrit 39.0 - 52.0 % 24.0  23.4  19.1   Platelets 150 - 400 K/uL 269  259  156        Latest Ref Rng & Units 10/22/2023    7:40 AM 10/21/2023    5:41 AM 10/20/2023    9:41 AM  BMP  Glucose 70 - 99 mg/dL 88  88  94   BUN 6 - 20 mg/dL 20  34  34   Creatinine 0.61 - 1.24 mg/dL 6.04  5.40  9.81   Sodium 135 - 145 mmol/L 138  138  137   Potassium 3.5 - 5.1 mmol/L 3.5  3.8  3.3   Chloride 98 - 111 mmol/L 103  108  106   CO2 22 - 32 mmol/L 25  21  21    Calcium 8.9 - 10.3 mg/dL 8.4  8.4  7.8       Assessment/Plan:  Principal Problem:   Symptomatic anemia Active Problems:   ESRD (end stage renal disease) on dialysis (HCC)   Iron deficiency anemia   Anemia of chronic disease   Peptic ulcer disease  Symptomatic anemia, acute on chronic Anemia of Chronic Kidney Disease  Hx PUD Hx of Iron Deficiency Anemia  Patient remains hemodynamically stable with a hemoglobin of 7.7. Anemia is likely multifactorial from possible chronic intermittent GI blood loss due to small bowel AVMs , AVF infiltration, and ESRD.  Plan: -ESA per nephrology  -GI consulted: Capsule study this morning  -Trend CBC, transfuse for Hgb <7 -Oral PPI BID    Vascular access complication Vascular US of the LUE showed no pseudoaneurysm or perigraft fluid with significant interstitial edema. The upper extremity is less painful today and remains edematous. Vascular surgery evaluated the patient and there is no surgical intervention at this time.  Plan: -Continue conservative management -If worsens, evaluate.  Consider compartment syndrome -Still  has functional tunneled HD cath, will use for now  -Vascular US of AVF in "several weeks"    ESRD on MWF HD Patient had HD on 3/3, additional inpatient sessions per HD.  Plan: -Consulted nephrology, appreciate their recommendations  -Continue home renvela 800 mg TID   HFrEF (LV EF 50-55% 01/2023) No acute concern for CHF exacerbation. BNP elevated to 357.4. Plan: -HD per nephro 10/21/2023   Hypertension Plan: -Continue home amlodipine 10 mg daily, carvedilol 25 mg daily (prescribed BID but only takes daily), irbesartan 150 mg daily   Type 2 diabetes mellitus Peripheral neuropathy No OP diabetes medications particularly insulin. Plan: -Continue home gabapentin 100 mg three times weekly following HD for neuropathy   Hx asthma No signs of acute exacerbation. Plan: -Continue formulary equivalent of home trelegy   HLD Plan: -Continue home atorvastatin 40 mg  daily   Peptic ulcer disease With recent admission for presumed upper GI bleed. -Oral PPI BID    Resolved Problems:  __________________________________  Code Status: Full  VTE Prophylaxis:SCDs Diet:NPO IVF:N/A Barriers to Discharge:Medical treatment of Anemia  Dispo: Anticipated discharge in approximately 1-2 day(s).   Faith Rogue, DO 10/22/2023, 12:34 PM Pager: [phone] After 5pm on weekdays and 1pm on weekends: On Call pager [phone]

## 2023-10-22 NOTE — Discharge Summary (Incomplete)
 Name: George Day MRN: [id number] DOB: 09-13-1975 48 y.o. PCP: George Macadamia, MD  Date of Admission: 10/20/2023  8:01 AM Date of Discharge: 10/23/2023 1:35 PM Attending Physician: Dr. Antony Contras  Discharge Diagnosis: Principal Problem:   Symptomatic anemia Active Problems:   ESRD (end stage renal disease) on dialysis (HCC)   Iron deficiency anemia   Anemia of chronic disease   Peptic ulcer disease    Discharge Medications: Allergies as of 10/23/2023       Reactions   Honey Bee Venom [bee Venom] Anaphylaxis   Penicillins Anaphylaxis   ALL   Tomato Swelling        Medication List     TAKE these medications    amLODipine 10 MG tablet Commonly known as: NORVASC TAKE 1 TABLET BY MOUTH DAILY   aspirin EC 81 MG tablet Take 1 tablet (81 mg total) by mouth daily.   atorvastatin 40 MG tablet Commonly known as: LIPITOR TAKE 1 TABLET BY MOUTH DAILY   carvedilol 25 MG tablet Commonly known as: COREG TAKE TWO (2) TABLETS BY MOUTH TWICE DAILY What changed: See the new instructions.   DULoxetine 30 MG capsule Commonly known as: Cymbalta Take 1 capsule (30 mg total) by mouth daily.   EPINEPHrine 0.3 mg/0.3 mL Soaj injection Commonly known as: EPI-PEN Inject 0.3 mg into the muscle as needed for anaphylaxis.   famotidine 20 MG tablet Commonly known as: PEPCID Take 20 mg by mouth daily.   gabapentin 100 MG capsule Commonly known as: Neurontin Take 100mg , three times a week only following dialysis.   nitroGLYCERIN 0.6 MG SL tablet Commonly known as: NITROSTAT Place 1 tablet (0.6 mg total) under the tongue every 5 (five) minutes as needed for chest pain.   olmesartan 20 MG tablet Commonly known as: BENICAR Take 1 tablet (20 mg total) by mouth at bedtime for high blood pressure.   pantoprazole 40 MG tablet Commonly known as: PROTONIX Take 1 tablet (40 mg total) by mouth daily.   sevelamer carbonate 800 MG tablet Commonly known as: RENVELA Take 1 tablet (800  mg total) by mouth 3 (three) times daily with meals. What changed: Another medication with the same name was removed. Continue taking this medication, and follow the directions you see here.   Trelegy Ellipta 100-62.5-25 MCG/ACT Aepb Generic drug: Fluticasone-Umeclidin-Vilant INHALE ONE (1) PUFF BY MOUTH DAILY What changed: See the new instructions.               Durable Medical Equipment  (From admission, onward)           Start     Ordered   10/23/23 1335  DME Cane  Once        10/23/23 1335            Disposition and follow-up:   [name] was discharged from [facility] in Good condition.  At the hospital follow up visit please address:  1.  Follow-up:  *Normocytic Anemia  -Repeat CBC -Ensure patient was contacted by GI for the results of the capsule endoscopy    *AVF Infiltration  -Ensure patient follows up with vascular surgery -Patient will need an ultrasound in future with vascular surgery    *ESRD -Encourage patient to continue attending HD sessions     2.  Labs / imaging needed at time of follow-up: CBC, RFP  3.  Pending labs/ test needing follow-up: Capsule endoscopy   4.  Medication Changes  STOPPED  -N/A   ADDED  -  N/A   MODIFIED  - N/A   Hospital Course by problem list: Symptomatic anemia Patient presented with a Hgb of 5.7 and received one unit of pRBC transfusion.  The anemia is normocytic which further supports the thought of low RBC production from ESRD causing the anemia. Other sources of anemia in patient are AVF infiltration. GI was consulted and performed a capsule endoscopy, GI will contact patient with the results. No evidence of overt GI bleeding during hospitalization. Patient received Aranesp 200 with nephrology.    AVF Infiltration  Vascular US of the LUE showed no pseudoaneurysm or perigraft fluid with significant interstitial edema. The upper extremity is less painful today and remains  edematous. Vascular surgery evaluated the patient and there is no surgical intervention at this time. He will need to follow up with their office for a US of the AVF and to determine when it will be safe to use.    ESRD on MWF HD Has missed 2 HD sessions recently, was apparently to have make-up session yesterday but was not able to do this. Did go to HD on Friday. Iron studies completed 02/12 with elevated ferritin 458, decreased TIBC 241. He received inpatient HD.    HFrEF (LV EF 50-55% 01/2023) No acute concern for CHF exacerbation. BNP elevated to 357.4.  Hypertension With some missed doses particulary of his evening mediations. He is quite hypertensive on arrival. Resumed home regimen.    Type 2 diabetes mellitus Peripheral neuropathy Continued home gabapentin 100 mg three times weekly following HD for neuropathy  Hx asthma Continued formulary equivalent of home trelegy  HLD Continued home atorvastatin 40 mg daily  PUD Patient was on PPI BID while hospitalized and sent home with daily PPI     Discharge Subjective: Patient is feeling well today. His LUE pain is currently a 2/10 and he denies any paraesthesias. We discussed the important signs of compartment syndrome and when to look out for those signs. He denies any complaints today and is waiting for HD. He is ready to go home today.    Discharge Exam:   Blood pressure (!) 156/85, pulse 74, temperature 98.6 F (37 C), temperature source Oral, resp. rate 17, height 5\' 8"  (1.727 m), weight 93.6 kg, SpO2 100%.  Constitutional:Well-appearing, sitting in a chair eating breakfast, in no acute distress Cardiovascular: regular rate and rhythm, no m/r/g, LUE AVF thrill is palpable Pulmonary/Chest: normal work of breathing on room air, lungs clear to auscultation bilaterally. No crackles  Abdominal: soft, non-tender, non-distended.  Neurological: alert & oriented x 3 MSK: Distal ROM preserved in LUE, active elbow extension is  limited by pain, radial pulses are present and symmetrical, brisk capillary refill in L hand is present, LUE is less edematous today, slight tenderness to palpation near the AVF but no tenderness to palpation elsewhere .  No pitting edema Skin: warm and dry Psych: Normal mood and affect  Pertinent Labs, Studies, and Procedures:     Latest Ref Rng & Units 10/23/2023    6:35 AM 10/22/2023    7:40 AM 10/21/2023    5:41 AM  CBC  WBC 4.0 - 10.5 K/uL 5.6  5.0  5.6   Hemoglobin 13.0 - 17.0 g/dL 7.9  7.7  7.1   Hematocrit 39.0 - 52.0 % 24.9  24.0  23.4   Platelets 150 - 400 K/uL 265  269  259        Latest Ref Rng & Units 10/23/2023    6:35 AM 10/22/2023  7:40 AM 10/21/2023    5:41 AM  CMP  Glucose 70 - 99 mg/dL 161  88  88   BUN 6 - 20 mg/dL 27  20  34   Creatinine 0.61 - 1.24 mg/dL 0.96  0.45  4.09   Sodium 135 - 145 mmol/L 133  138  138   Potassium 3.5 - 5.1 mmol/L 3.5  3.5  3.8   Chloride 98 - 111 mmol/L 104  103  108   CO2 22 - 32 mmol/L 22  25  21    Calcium 8.9 - 10.3 mg/dL 8.2  8.4  8.4   Total Protein 6.5 - 8.1 g/dL 6.4     Total Bilirubin 0.0 - 1.2 mg/dL 1.0     Alkaline Phos 38 - 126 U/L 76     AST 15 - 41 U/L 17     ALT 0 - 44 U/L 10       VAS US DUPLEX DIALYSIS ACCESS (AVF, AVG) Result Date: 10/20/2023 DIALYSIS ACCESS Patient Name:  JAVIER GELL  Date of Exam:   10/20/2023 Medical Rec #: [id number]       Accession #:    [id number] Date of Birth: August 11, 1976        Patient Gender: M Patient Age:   18 years Exam Location:  [facility] Procedure:      VAS US DUPLEX DIALYSIS ACCESS (AVF, AVG) Referring Phys: Fayrene Fearing [place] --------------------------------------------------------------------------------  Reason for Exam: Infiltration of graft at dialysis Friday. Increasing pain and                  swelling today. Access Site: Left Upper Extremity. Access Type: Ulnar artery to brachial vein Gore-Tex graft. History: ESRD. Recent GI Bleed earlier this month. HTN. CAD. HFpEF. Peptic  Ulcer          disease. Multiple Sclerosis. Limitations: Edema and tissue properties Comparison Study: No prior study on file Performing Technologist: Sherren Kerns RVS  Examination Guidelines: A complete evaluation includes B-mode imaging, spectral Doppler, color Doppler, and power Doppler as needed of all accessible portions of each vessel. Unilateral testing is considered an integral part of a complete examination. Limited examinations for reoccurring indications may be performed as noted.  Findings:   +--------------------+----------+-----------------+---------+ AVG                 PSV (cm/s)Flow Vol (mL/min)Describe  +--------------------+----------+-----------------+---------+ Arterial anastomosis   647                               +--------------------+----------+-----------------+---------+ Prox graft             476                     narrowing +--------------------+----------+-----------------+---------+ Mid graft              261                               +--------------------+----------+-----------------+---------+ Distal graft           340                               +--------------------+----------+-----------------+---------+  Summary: Narrowing noted proximal graft with elevated velocities noted. No pseudoaneurysm or perigraft fluid. Significant interstitial edema throughout the left upper extremity.  *See table(s) above  for measurements and observations.  Diagnosing physician: Coral Else MD Electronically signed by Coral Else MD on 10/20/2023 at 9:28:25 PM.    --------------------------------------------------------------------------------   Final    DG Chest 2 View Result Date: 10/20/2023 CLINICAL DATA:  Shortness of breath EXAM: CHEST - 2 VIEW COMPARISON:  With 10/02/2023 FINDINGS: Chronic cardiomegaly. Dialysis catheter on the right with tip at the upper cavoatrial junction. Vascular congestion and fissure thickening on the lateral view. There is no edema,  consolidation, effusion, or pneumothorax. Extensive artifact from EKG leads. IMPRESSION: Cardiomegaly and mild vascular congestion. Electronically Signed   By: Tiburcio Pea M.D.   On: 10/20/2023 09:46     Discharge Instructions: Discharge Instructions     (HEART FAILURE PATIENTS) Call MD:  Anytime you have any of the following symptoms: 1) 3 pound weight gain in 24 hours or 5 pounds in 1 week 2) shortness of breath, with or without a dry hacking cough 3) swelling in the hands, feet or stomach 4) if you have to sleep on extra pillows at night in order to breathe.   Complete by: As directed    Ambulatory referral to Physical Therapy   Complete by: As directed    Call MD for:  difficulty breathing, headache or visual disturbances   Complete by: As directed    Call MD for:  extreme fatigue   Complete by: As directed    Call MD for:  hives   Complete by: As directed    Call MD for:  persistant dizziness or light-headedness   Complete by: As directed    Call MD for:  persistant nausea and vomiting   Complete by: As directed    Call MD for:  redness, tenderness, or signs of infection (pain, swelling, redness, odor or green/yellow discharge around incision site)   Complete by: As directed    Call MD for:  severe uncontrolled pain   Complete by: As directed    Call MD for:  temperature >100.4   Complete by: As directed    Diet - low sodium heart healthy   Complete by: As directed    Discharge instructions   Complete by: As directed    You came to the hospital for arm swelling and pain and you were diagnosed with an infiltration of the fistula and anemia.  We treated you with a blood transfusion.    *For your AVF (fistula)  -You will need to have an ultrasound of your fistula once the swelling improves.  -Please see your vascular surgeon in 2-4 weeks -If you have an increase in LUE pain, changes in sensation, worse swelling, have a change in temperature in the left hand: please seek care  at the emergency department immediatly   *For your anemia -The stomach doctors (GI) will call you with the results of the capsule and offer their recommendations at that time.   Continue taking your medications as previously prescribed   If you have any questions or concerns please feel free to call: Internal medicine clinic at [phone]  Please go to your PCP's office for an appointment on: March 13th at 9:15  If you have any of these following symptoms, please call us or seek care at an emergency department: -Chest Pain -Difficulty Breathing -Worsening arm  pain -Sensation changes in the left arm or weakness of the left arm  -Syncope (passing out) -Drooping of face -Slurred speech -Sudden weakness in your leg or arm -Fever -Chills   We are glad that you  are feeling better, it was a pleasure to care for you!  Faith Rogue DO   Increase activity slowly   Complete by: As directed    No wound care   Complete by: As directed        Signed: Faith Rogue DO [facility] - PGY1 Pager: [phone] 10/23/2023, 1:35 PM    Please contact the on call pager after 5 pm and on weekends at [phone].

## 2023-10-22 NOTE — Plan of Care (Signed)

## 2023-10-22 NOTE — Progress Notes (Signed)
  [facility] Progress Note   Subjective: Seen and examined at bedside. No complaints this am. Excited to eat. No further bleeding.   Objective Vitals:   10/21/23 2004 10/22/23 0200 10/22/23 0509 10/22/23 0806  BP: 139/73 137/83 138/79 (!) 156/85  Pulse: 80 73 75 74  Resp:  18  17  Temp: 98.9 F (37.2 C) 97.8 F (36.6 C) 99 F (37.2 C) 98.6 F (37 C)  TempSrc: Oral Oral Oral Oral  SpO2: 97% 98% 99% 100%  Weight:      Height:         Additional Objective Labs: Basic Metabolic Panel: Recent Labs  Lab 10/20/23 0941 10/21/23 0541 10/22/23 0740  NA 137 138 138  K 3.3* 3.8 3.5  CL 106 108 103  CO2 21* 21* 25  GLUCOSE 94 88 88  BUN 34* 34* 20  CREATININE 6.25* 5.87* 4.69*  CALCIUM 7.8* 8.4* 8.4*  PHOS  --  4.4 4.2   CBC: Recent Labs  Lab 10/20/23 0941 10/21/23 0541 10/22/23 0740  WBC 6.8 5.6 5.0  NEUTROABS 5.1  --   --   HGB 5.7* 7.1* 7.7*  HCT 19.1* 23.4* 24.0*  MCV 85.7 85.4 81.9  PLT 156 259 269   Blood Culture No results found for: "SDES", "SPECREQUEST", "CULT", "REPTSTATUS"   Physical Exam General: Well appearing, nad Heart: RRR Lungs: Clear, normal wob Abdomen: soft non -tender Extremities: no LE edema  Dialysis Access: TDC; L AVF +bruit, +swelling  Medications:   sodium chloride   Intravenous Once   amLODipine  10 mg Oral Daily   atorvastatin  40 mg Oral Daily   carvedilol  25 mg Oral Daily   Chlorhexidine Gluconate Cloth  6 each Topical Daily   Chlorhexidine Gluconate Cloth  6 each Topical Q0600   darbepoetin (ARANESP) injection - DIALYSIS  200 mcg Subcutaneous Q Mon-1800   fluticasone furoate-vilanterol  1 puff Inhalation Daily   And   umeclidinium bromide  1 puff Inhalation Daily   gabapentin  100 mg Oral Once per day on Monday Wednesday Friday   irbesartan  150 mg Oral Daily   pantoprazole  40 mg Oral BID   polyethylene glycol  17 g Oral Daily   sevelamer carbonate  800 mg Oral TID WC    Dialysis Orders:   MWF  [facility] 4h   B400    89kg  2K bath  Heparin 5000  RIJ TDC/ L AVG   - mircera 150 mcg IV q 2 wks, last 2/21, due 3/07 - last 3 hb 11.1 on 2/4, 9.2 on 2/17 and 6.2 on 2/28 - hectorol 6 mcg three times per week  Assessment/Plan: ABALA - Hb 5.7 on admit. S/p prbcs. Transfuse prn. GI consulted - for capsule endoscopy. Further tranfusions per pmd as needed. Infiltration certainly contributing to Hb drop.  AVF infiltration - not using AVF for now, using TDC R neck.  Anemia of esrd - Received Aranesp 200 on 3/3.  Hgb 7.7  ESRD - on HD MWF. Continue on schedule.  HTN - max UF w/ HD today.  Volume - L arm swelling, mild LE edema. Not yet to EDW.  Secondary hyperparathyroidism - CCa and phos are in range. Cont binders w/ meals.   Tomasa Blase PA-C [facility] 10/22/2023,1:17 PM

## 2023-10-23 DIAGNOSIS — N186 End stage renal disease: Secondary | ICD-10-CM | POA: Diagnosis not present

## 2023-10-23 DIAGNOSIS — Z992 Dependence on renal dialysis: Secondary | ICD-10-CM | POA: Diagnosis not present

## 2023-10-23 DIAGNOSIS — D649 Anemia, unspecified: Secondary | ICD-10-CM | POA: Diagnosis not present

## 2023-10-23 DIAGNOSIS — D509 Iron deficiency anemia, unspecified: Secondary | ICD-10-CM | POA: Diagnosis not present

## 2023-10-23 LAB — HEPATITIS A ANTIBODY, TOTAL: hep A Total Ab: REACTIVE — AB

## 2023-10-23 LAB — RENAL FUNCTION PANEL
Albumin: 2.9 g/dL — ABNORMAL LOW (ref 3.5–5.0)
Anion gap: 7 (ref 5–15)
BUN: 27 mg/dL — ABNORMAL HIGH (ref 6–20)
CO2: 22 mmol/L (ref 22–32)
Calcium: 8.2 mg/dL — ABNORMAL LOW (ref 8.9–10.3)
Chloride: 104 mmol/L (ref 98–111)
Creatinine, Ser: 5.91 mg/dL — ABNORMAL HIGH (ref 0.61–1.24)
GFR, Estimated: 11 mL/min — ABNORMAL LOW (ref >60)
Glucose, Bld: 111 mg/dL — ABNORMAL HIGH (ref 70–99)
Phosphorus: 3.5 mg/dL (ref 2.5–4.6)
Potassium: 3.5 mmol/L (ref 3.5–5.1)
Sodium: 133 mmol/L — ABNORMAL LOW (ref 135–145)

## 2023-10-23 LAB — HEPATIC FUNCTION PANEL
ALT: 10 U/L (ref 0–44)
AST: 17 U/L (ref 15–41)
Albumin: 2.9 g/dL — ABNORMAL LOW (ref 3.5–5.0)
Alkaline Phosphatase: 76 U/L (ref 38–126)
Bilirubin, Direct: 0.1 mg/dL (ref 0.0–0.2)
Indirect Bilirubin: 0.9 mg/dL (ref 0.3–0.9)
Total Bilirubin: 1 mg/dL (ref 0.0–1.2)
Total Protein: 6.4 g/dL — ABNORMAL LOW (ref 6.5–8.1)

## 2023-10-23 LAB — MAGNESIUM: Magnesium: 2.2 mg/dL (ref 1.7–2.4)

## 2023-10-23 LAB — CBC
HCT: 24.9 % — ABNORMAL LOW (ref 39.0–52.0)
Hemoglobin: 7.9 g/dL — ABNORMAL LOW (ref 13.0–17.0)
MCH: 26.2 pg (ref 26.0–34.0)
MCHC: 31.7 g/dL (ref 30.0–36.0)
MCV: 82.5 fL (ref 80.0–100.0)
Platelets: 265 10*3/uL (ref 150–400)
RBC: 3.02 MIL/uL — ABNORMAL LOW (ref 4.22–5.81)
RDW: 20.2 % — ABNORMAL HIGH (ref 11.5–15.5)
WBC: 5.6 10*3/uL (ref 4.0–10.5)
nRBC: 0 % (ref 0.0–0.2)

## 2023-10-23 LAB — CK: Total CK: 87 U/L (ref 49–397)

## 2023-10-23 MED ORDER — ANTICOAGULANT SODIUM CITRATE 4% (200MG/5ML) IV SOLN: 5.0000 mL | Status: DC | PRN

## 2023-10-23 MED ORDER — HEPARIN SODIUM (PORCINE) 1000 UNIT/ML DIALYSIS: 1000.0000 [IU] | INTRAMUSCULAR | Status: DC | PRN

## 2023-10-23 MED ORDER — LIDOCAINE-PRILOCAINE 2.5-2.5 % EX CREA: 1.0000 | TOPICAL_CREAM | CUTANEOUS | Status: DC | PRN

## 2023-10-23 MED ORDER — ALTEPLASE 2 MG IJ SOLR: 2.0000 mg | Freq: Once | INTRAMUSCULAR | Status: DC | PRN

## 2023-10-23 MED ORDER — HEPARIN SODIUM (PORCINE) 1000 UNIT/ML IJ SOLN
3800.0000 [IU] | Freq: Once | INTRAMUSCULAR | Status: AC
  Administered 2023-10-23: 3800 [IU]
  Filled 2023-10-23: qty 4

## 2023-10-23 MED ORDER — PENTAFLUOROPROP-TETRAFLUOROETH EX AERO: 1.0000 | INHALATION_SPRAY | CUTANEOUS | Status: DC | PRN

## 2023-10-23 MED ORDER — LIDOCAINE HCL (PF) 1 % IJ SOLN: 5.0000 mL | INTRAMUSCULAR | Status: DC | PRN

## 2023-10-23 NOTE — Progress Notes (Signed)
 Received patient in bed to unit.  Alert and oriented.  Informed consent signed and in chart.   TX duration:3.5 hours  Patient tolerated well.  Transported back to the room  Alert, without acute distress.  Hand-off given to patient's nurse.   Access used: Right internal jugular HD Cath Access issues: none  Total UF removed: 3L Medication(s) given: none   10/23/23 1904  Vitals  Temp 98.6 F (37 C)  Temp Source Oral  BP 123/83  MAP (mmHg) 95  Pulse Rate 83  ECG Heart Rate 80  Resp (!) 24  MEWS COLOR  MEWS Score Color Green  Oxygen Therapy  SpO2 100 %  O2 Device Room Air  MEWS Score  MEWS Temp 0  MEWS Systolic 0  MEWS Pulse 0  MEWS RR 1  MEWS LOC 0  MEWS Score 1     Stacie Glaze LPN [facility]

## 2023-10-23 NOTE — Progress Notes (Signed)
 Contacted by attending that pt will d/c to home after HD today. Contacted [facility] to be advised of pt's d/c today and that pt should resume care on Friday.   Olivia Canter Renal Navigator [phone]

## 2023-10-23 NOTE — TOC Progression Note (Signed)
 Transition of Care (TOC) - Progression Note   PT recommending OP PT and cane.   Discussed with patient. Patient will purchase his own cane. He would like to do OP PT at [facility] at [address], [place], [place] [address] phone [phone]  , fax [phone] . NCM called same, they are requesting a MD order stating DX and eval and treat , be faxed to them. NCM secure chatted MD team  Patient Details  Name: George Day MRN: [id number] Date of Birth: 1976/05/08  Transition of Care [facility]) CM/SW Contact  Cashlynn Yearwood, Adria Devon, RN Phone Number: 10/23/2023, 1:49 PM  Clinical Narrative:       Expected Discharge Plan:  (await PT evaluation) Barriers to Discharge: Continued Medical Work up  Expected Discharge Plan and Services In-house Referral: NA Discharge Planning Services: CM Consult Post Acute Care Choice: NA Living arrangements for the past 2 months: Single Family Home Expected Discharge Date: 10/23/23               DME Arranged: N/A DME Agency: NA       HH Arranged: NA HH Agency: NA         Social Determinants of Health (SDOH) Interventions SDOH Screenings   Food Insecurity: Food Insecurity Present (10/20/2023)  Housing: High Risk (10/20/2023)  Transportation Needs: No Transportation Needs (10/20/2023)  Recent Concern: Transportation Needs - Unmet Transportation Needs (09/18/2023)  Utilities: At Risk (10/20/2023)  Alcohol Screen: Low Risk  (09/20/2022)  Depression (PHQ2-9): Low Risk  (09/19/2023)  Financial Resource Strain: Medium Risk (09/20/2022)  Social Connections: Moderately Integrated (10/20/2023)  Tobacco Use: Medium Risk (10/20/2023)    Readmission Risk Interventions    10/03/2023    2:11 PM 03/04/2023    3:23 PM  Readmission Risk Prevention Plan  Transportation Screening Complete Complete  PCP or Specialist Appt within 5-7 Days Complete   PCP or Specialist Appt within 3-5 Days  Complete  Home Care Screening Complete   Medication Review (RN CM) Referral to Pharmacy    HRI or Home Care Consult  Complete  Social Work Consult for Recovery Care Planning/Counseling  Complete  Palliative Care Screening  Not Applicable  Medication Review Oceanographer)  Referral to Pharmacy

## 2023-10-23 NOTE — Progress Notes (Signed)
  [facility] Progress Note   Subjective:  Seen in room. Waiting for dialysis. No further bleeding. No cp, sob, n/v.   Objective Vitals:   10/22/23 1932 10/23/23 0156 10/23/23 0451 10/23/23 0726  BP: (!) 160/87 (!) 144/78 (!) 143/81 (!) 168/101  Pulse: 80 82 82 94  Resp: 16 17 18 16   Temp: 98.2 F (36.8 C) 98.6 F (37 C) 98.8 F (37.1 C) 98.6 F (37 C)  TempSrc: Oral Oral Oral Oral  SpO2: 100% 98% 99% 100%  Weight:      Height:         Additional Objective Labs: Basic Metabolic Panel: Recent Labs  Lab 10/21/23 0541 10/22/23 0740 10/23/23 0635  NA 138 138 133*  K 3.8 3.5 3.5  CL 108 103 104  CO2 21* 25 22  GLUCOSE 88 88 111*  BUN 34* 20 27*  CREATININE 5.87* 4.69* 5.91*  CALCIUM 8.4* 8.4* 8.2*  PHOS 4.4 4.2 3.5   CBC: Recent Labs  Lab 10/20/23 0941 10/21/23 0541 10/22/23 0740 10/23/23 0635  WBC 6.8 5.6 5.0 5.6  NEUTROABS 5.1  --   --   --   HGB 5.7* 7.1* 7.7* 7.9*  HCT 19.1* 23.4* 24.0* 24.9*  MCV 85.7 85.4 81.9 82.5  PLT 156 259 269 265   Blood Culture No results found for: "SDES", "SPECREQUEST", "CULT", "REPTSTATUS"   Physical Exam General: Well appearing, nad Heart: RRR Lungs: Clear, normal wob Abdomen: soft non -tender Extremities: no LE edema  Dialysis Access: TDC; L AVF +bruit, +swelling  Medications:  anticoagulant sodium citrate      sodium chloride   Intravenous Once   amLODipine  10 mg Oral Daily   atorvastatin  40 mg Oral Daily   carvedilol  25 mg Oral Daily   Chlorhexidine Gluconate Cloth  6 each Topical Daily   darbepoetin (ARANESP) injection - DIALYSIS  200 mcg Subcutaneous Q Mon-1800   fluticasone furoate-vilanterol  1 puff Inhalation Daily   And   umeclidinium bromide  1 puff Inhalation Daily   gabapentin  100 mg Oral Once per day on Monday Wednesday Friday   irbesartan  150 mg Oral Daily   pantoprazole  40 mg Oral BID   polyethylene glycol  17 g Oral Daily   sevelamer carbonate  800 mg Oral TID WC     Dialysis Orders:   MWF [facility] 4h   B400    89kg  2K bath  Heparin 5000  RIJ TDC/ L AVG   - mircera 150 mcg IV q 2 wks, last 2/21, due 3/07 - last 3 hb 11.1 on 2/4, 9.2 on 2/17 and 6.2 on 2/28 - hectorol 6 mcg three times per week  Assessment/Plan: ABALA - Hb 5.7 on admit. S/p prbcs. Transfuse prn. GI following results of capsule endoscopy. Further tranfusions per pmd as needed. Infiltration certainly contributing to Hb drop.  AVF infiltration - not using AVF for now, using TDC R neck.  Anemia of esrd - Received Aranesp 200 on 3/3.  Hgb 7.9  ESRD - on HD MWF. Continue on schedule.  HTN - BP up with volume excess. Max UF with HD  Volume - L arm swelling, mild LE edema. Not yet to EDW.  Secondary hyperparathyroidism - CCa and phos are in range. Cont binders w/ meals.   Tomasa Blase PA-C [facility] 10/23/2023,1:54 PM

## 2023-10-23 NOTE — Progress Notes (Signed)
 Physical Therapy Note  Contacted Ortho Tech, who relayed order to [place], [facility] (in house);  Recommend follow up at Outpatient Orthotist Clinic for optimal AFO for dorsiflexion assist;  Pt has his choice of Outpt clinics; [facility] are local and have multiple locations; [address] clinic number is [phone]  Van Clines, PT  Acute Rehabilitation Services Office [phone] Secure Chat welcomed

## 2023-10-23 NOTE — Progress Notes (Addendum)
 Progress Note  Primary GI: Dr. Leonides Schanz  LOS: 0 days   Chief Complaint:symptomatic anemia   Subjective   Patient reports he is doing well today and would like to go home. Denies overt GI bleeding. No pain, nausea, or vomiting.   Objective   Vital signs in last 24 hours: Temp:  [98.2 F (36.8 C)-98.8 F (37.1 C)] 98.6 F (37 C) (03/05 0726) Pulse Rate:  [71-94] 94 (03/05 0726) Resp:  [16-18] 16 (03/05 0726) BP: (143-168)/(78-101) 168/101 (03/05 0726) SpO2:  [98 %-100 %] 100 % (03/05 0726) Last BM Date : 10/22/23 Last BM recorded by nurses in past 5 days Stool Type: Type 6 (Mushy consistency with ragged edges) (10/22/2023  3:24 PM)  General:   male in no acute distress  Heart:  Regular rate and rhythm; no murmurs Pulm: Clear anteriorly; no wheezing Abdomen: soft, nondistended, normal bowel sounds in all quadrants. Nontender without guarding. No organomegaly appreciated. Extremities:  No edema Neurologic:  Alert and  oriented x4;  No focal deficits.  Psych:  Cooperative. Normal mood and affect.  Intake/Output from previous day: 03/04 0701 - 03/05 0700 In: 720 [P.O.:720] Out: -  Intake/Output this shift: No intake/output data recorded.  Studies/Results: No results found.  Lab Results: Recent Labs    10/21/23 0541 10/22/23 0740 10/23/23 0635  WBC 5.6 5.0 5.6  HGB 7.1* 7.7* 7.9*  HCT 23.4* 24.0* 24.9*  PLT 259 269 265   BMET Recent Labs    10/21/23 0541 10/22/23 0740 10/23/23 0635  NA 138 138 133*  K 3.8 3.5 3.5  CL 108 103 104  CO2 21* 25 22  GLUCOSE 88 88 111*  BUN 34* 20 27*  CREATININE 5.87* 4.69* 5.91*  CALCIUM 8.4* 8.4* 8.2*   LFT Recent Labs    10/23/23 0635  PROT 6.4*  ALBUMIN 2.9*  2.9*  AST 17  ALT 10  ALKPHOS 76  BILITOT 1.0  BILIDIR 0.1  IBILI 0.9   PT/INR No results for input(s): "LABPROT", "INR" in the last 72 hours.   Scheduled Meds:  sodium chloride   Intravenous Once   amLODipine  10 mg Oral Daily   atorvastatin  40  mg Oral Daily   carvedilol  25 mg Oral Daily   Chlorhexidine Gluconate Cloth  6 each Topical Daily   darbepoetin (ARANESP) injection - DIALYSIS  200 mcg Subcutaneous Q Mon-1800   fluticasone furoate-vilanterol  1 puff Inhalation Daily   And   umeclidinium bromide  1 puff Inhalation Daily   gabapentin  100 mg Oral Once per day on Monday Wednesday Friday   irbesartan  150 mg Oral Daily   pantoprazole  40 mg Oral BID   polyethylene glycol  17 g Oral Daily   sevelamer carbonate  800 mg Oral TID WC   Continuous Infusions:  anticoagulant sodium citrate        Patient profile:   48 year old male with end-stage renal disease on hemodialysis, left upper arm graft on April 24, 2023, noted to have hematoma with significant upper arm swelling, possible pseudoaneurysm. He was hospitalized with symptomatic anemia without overt GI bleeding.   Impression:   Symptomatic anemia UTD on colonoscopy and had unremarkable EGD last month VCE pending Hgb 7.9 (7.7) No overt bleeding.  Hemoglobin remained stable.  VCE currently pending.  Patient can likely be discharged home after dialysis today and we can call him with the results of his VCE.  Non H.pylori related gastritis   CHF with preserved  EF   ESRD on HD BUN 27, creatinine 5.91, GFR 11.  Scheduled for HD today   Plan:   - No further workup from GI standpoint - Pending VCE study, we will call patient with results and further management based on results - Will likely sign off  George Day  10/23/2023, 11:26 AM    Attending physician's note   I have taken a history, reviewed the chart and examined the patient. I performed a substantive portion of this encounter, including complete performance of at least one of the key components, in conjunction with the APP. I agree with the APP's note, impression and recommendations.    Hgb stable, no overt bleeding Small bowel video capsule study to exclude small bowel occult blood loss with  AVM, will contact patient with results Patient wants to go home, ok to discharge from GI standpoint  The patient was provided an opportunity to ask questions and all were answered. The patient agreed with the plan and demonstrated an understanding of the instructions.   Iona Beard , MD [phone]

## 2023-10-23 NOTE — Plan of Care (Signed)

## 2023-10-23 NOTE — Evaluation (Signed)
 Physical Therapy Evaluation and Discharge Patient Details Name: George Day MRN: [id number] DOB: 1976-01-03 Today's Date: 10/23/2023  History of Present Illness  George Day is a 48 year old gentleman presenting to the emergency department with 2-day history of arm pain after infiltration at dialysis as well as symptomatic anemia; reporting some gait difficulty, RLE "dragging" as well; with pertinent past medical history of ESRD on Monday Wednesday Friday HD, CAD, hypertension, peptic ulcer disease, HFpEF, and presumed MS  Clinical Impression   Patient evaluated by Physical Therapy with no further acute PT needs identified. All education has been completed and the patient has no further questions. [name] has RLE weakness which effects his gait steadiness and increases his fall risk; When we walked with ace wrap for (temporary) dorsiflexion assist, he was steadier and more confident; Recommend he follow up with an Orthotist (here or as Outpt) for optimal dorsiflexion assist; Overall walking well, recommend straight cane as well (does not NEED it to discharge from hospital); Questions solicited and answered;  See below for any follow-up Physical Therapy or equipment needs. PT is signing off. Thank you for this referral.            Outpt PT for gait and balance dysfunction Is pt established with Neurologist?   Can travel by private vehicle    yes    Equipment Recommendations Cane (Pt can obtain cane)  Recommendations for Other Services  Other (comment) (Orthotist Consult for RLE dorsiflexion assist)    Functional Status Assessment Patient has not had a recent decline in their functional status     Precautions / Restrictions Precautions Precautions: Fall Restrictions Weight Bearing Restrictions Per Provider Order: No      Mobility  Bed Mobility                    Transfers Overall transfer level: Modified independent                       Ambulation/Gait Ambulation/Gait assistance: Supervision Gait Distance (Feet): 600 Feet Assistive device: None, Straight cane Gait Pattern/deviations: Decreased step length - right, Decreased dorsiflexion - right       General Gait Details: Noteworthy decr toe/foot clearance RLE, effecting R step length, increasing fall risk; after aCe wrap for dorsiflexion assist, better clearance which helps gait efficiency, equalizes step length; walked with and without straight cane, and pt notably more confident with straight cane in L hand  Stairs Stairs: Yes Stairs assistance: Supervision Stair Management: One rail Left, Forwards, Alternating pattern, Step to pattern Number of Stairs: 12 General stair comments: Overall no difficutly with R ankle ace wrapped in dorsiflexion assist, pt reports it's a lot easier to bring RLE up each step  Wheelchair Mobility     Tilt Bed    Modified Rankin (Stroke Patients Only)       Balance Overall balance assessment: Mild deficits observed, not formally tested                                           Pertinent Vitals/Pain Pain Assessment Pain Assessment: No/denies pain    Home Living Family/patient expects to be discharged to:: Private residence Living Arrangements: Spouse/significant other;Children Available Help at Discharge: Available PRN/intermittently Type of Home: House Home Access: Stairs to enter Entrance Stairs-Rails: Right (2 steps from car to porch, no rails) Entrance Stairs-Number of Steps: 7 (4+3)  Home Layout: One level        Prior Function Prior Level of Function : Independent/Modified Independent             Mobility Comments: Sometimes will furniture walk when he gets off balance ADLs Comments: Modified independnet     Extremity/Trunk Assessment   Upper Extremity Assessment Upper Extremity Assessment: Right hand dominant;LUE deficits/detail LUE Deficits / Details: Some soreness from  infiltration, but able to manage cane well    Lower Extremity Assessment Lower Extremity Assessment: RLE deficits/detail RLE Deficits / Details: quad 3+/5, hip flexor 3/5, ankle dorsiflexion 2/2       Communication   Communication Communication: No apparent difficulties    Cognition Arousal: Alert Behavior During Therapy: WFL for tasks assessed/performed   PT - Cognitive impairments: No apparent impairments                         Following commands: Intact       Cueing Cueing Techniques: Verbal cues     General Comments General comments (skin integrity, edema, etc.): walked with and without Ace wrap for dorsiflexion assist, and with and without cane; discussed recommendation for Orthostist consult and Outpt PT; pt verbalizes understanding    Exercises     Assessment/Plan    PT Assessment All further PT needs can be met in the next venue of care  PT Problem List Decreased strength;Decreased range of motion;Decreased balance;Decreased coordination;Decreased knowledge of use of DME;Impaired tone       PT Treatment Interventions      PT Goals (Current goals can be found in the Care Plan section)  Acute Rehab PT Goals Patient Stated Goal: more confidence with walking; decr fall risk PT Goal Formulation: All assessment and education complete, DC therapy    Frequency       Co-evaluation               AM-PAC PT "6 Clicks" Mobility  Outcome Measure Help needed turning from your back to your side while in a flat bed without using bedrails?: None Help needed moving from lying on your back to sitting on the side of a flat bed without using bedrails?: None Help needed moving to and from a bed to a chair (including a wheelchair)?: None Help needed standing up from a chair using your arms (e.g., wheelchair or bedside chair)?: None Help needed to walk in hospital room?: None Help needed climbing 3-5 steps with a railing? : None 6 Click Score: 24    End  of Session Equipment Utilized During Treatment: Other (comment) (with and without ace wrap for dorsiflexion assist) Activity Tolerance: Patient tolerated treatment well Patient left: in chair;with call bell/phone within reach (Medical Team in to see him) Nurse Communication: Mobility status PT Visit Diagnosis: Unsteadiness on feet (R26.81);Other abnormalities of gait and mobility (R26.89);Other symptoms and signs involving the nervous system (R29.898);Hemiplegia and hemiparesis Hemiplegia - Right/Left: Right Hemiplegia - dominant/non-dominant: Dominant Hemiplegia - caused by: Other cerebrovascular disease    Time: 0815-0854 PT Time Calculation (min) (ACUTE ONLY): 39 min   Charges:   PT Evaluation $PT Eval Low Complexity: 1 Low PT Treatments $Gait Training: 23-37 mins PT General Charges $$ ACUTE PT VISIT: 1 Visit         Van Clines, PT  Acute Rehabilitation Services Office [phone] Secure Chat welcomed   Levi Aland 10/23/2023, 9:36 AM

## 2023-10-23 NOTE — Progress Notes (Signed)
 Pt transported back from dialysis. Pt is discharged and ready to go home. Went over discharge instructions with read back from the Pt. Discussed medications and appointments  as well as what symptoms to look out for and when to return to the hospital. Pt verbalized understanding. Transported to the lobby where he met his wife and children

## 2023-10-24 LAB — HEPATITIS B SURFACE ANTIBODY, QUANTITATIVE: Hep B S AB Quant (Post): <3.5 m[IU]/mL — ABNORMAL LOW

## 2023-10-24 NOTE — Discharge Planning (Signed)
 [facility] Patient Discharge Orders- [facility] CLINIC: [facility]  Patient's name: George Day Admit/DC Dates: 10/20/2023 - 10/23/2023  Discharge Diagnoses: ABLA s/p transfusion. Capsule endoscopy in progress   S/p AVG infiltration    Recent Labs  Lab 10/23/23 0635  HGB 7.9*  K 3.5  CALCIUM 8.2*  PHOS 3.5  ALBUMIN 2.9*  2.9*   Aranesp: Given: Yes    Date of last dose/amount: 200 mcg on 3/3   PRBC's Given: Yes  Date/# of units: 1 u on 3/3  ESA dose for discharge: Mircera 200 mcg IV q 2 weeks.   Outpatient Dialysis Orders:  -Heparin: Hold heparin pending GI work up  -EDW No change   -[place]: No change   Access intervention/Change: ---   OTHER/APPTS    Completed by: Tomasa Blase PA-C   D/C Meds to be reconciled by nurse after every discharge.    Reviewed by: MD:______ RN_______

## 2023-10-26 ENCOUNTER — Ambulatory Visit
Admission: RE | Admit: 2023-10-26 | Discharge: 2023-10-26 | Disposition: A | Discharge status: Home / Self Care | Payer: 59 | Source: Ambulatory Visit | Attending: Neurology | Admitting: Neurology

## 2023-10-26 DIAGNOSIS — G35 Multiple sclerosis: Secondary | ICD-10-CM | POA: Diagnosis not present

## 2023-10-26 DIAGNOSIS — R269 Unspecified abnormalities of gait and mobility: Secondary | ICD-10-CM

## 2023-10-26 MED ORDER — GADOPICLENOL 0.5 MMOL/ML IV SOLN
10.0000 mL | Freq: Once | INTRAVENOUS | Status: AC | PRN
  Administered 2023-10-26: 10 mL via INTRAVENOUS

## 2023-10-27 ENCOUNTER — Encounter (HOSPITAL_COMMUNITY): Payer: Self-pay | Admitting: Gastroenterology

## 2023-10-28 ENCOUNTER — Telehealth: Payer: Self-pay | Admitting: Neurology

## 2023-10-28 NOTE — Telephone Encounter (Signed)
 Called and spoke to pt and relayed reseults/recommendations, scheduled appt for 11/11/23 as [name] requested to go over mri and discuss treatment options

## 2023-10-28 NOTE — Telephone Encounter (Signed)
 Please call patient MRI of cervical and thoracic spine showed evidence of chronic demyelinating changes  Is on schedule for MRI of the brain October 31, 2023,  Please give him a follow-up appointment after that to my next available to discuss treatment options   IMPRESSION: MRI scan of cervical spine with and without contrast showing prominent spondylitic change at C6/7 resulting in mild canal and severe left and moderate right-sided foraminal narrowing.  C4-5 also shows spondylitic change with mild canal and bilateral foraminal narrowing.  Ill-defined spinal cord hyperintensities posteriorly and to the right at C5 and at T1-T2 represent remote age demyelinating plaques.  No enhancing lesions are noted on postcontrast images.  IMPRESSION: MRI scan of thoracic spine with and without contrast showing ill-defined spinal cord hyperintensity at T1-T2 which may represent remote age demyelinating plaque.  No enhancing lesions are noted.  There is no frank cord compression significant root or foraminal encroachment.

## 2023-10-29 ENCOUNTER — Ambulatory Visit: Payer: Self-pay

## 2023-10-29 DIAGNOSIS — N186 End stage renal disease: Secondary | ICD-10-CM

## 2023-10-29 DIAGNOSIS — G35 Multiple sclerosis: Secondary | ICD-10-CM

## 2023-10-29 NOTE — Addendum Note (Signed)
 Addended by: Riley Churches on: 10/29/2023 04:48 PM   Modules accepted: Orders

## 2023-10-29 NOTE — Patient Outreach (Signed)
 Care Coordination   Follow Up Visit Note   10/29/2023 Name: Keyron Pokorski MRN: [id number] DOB: 21-Nov-1975  Tiburcio Linder is a 48 y.o. year old male who sees [name], MD for primary care. I spoke with  [name] by phone today.  What matters to the patients health and wellness today?   Patient would like to discuss the treatment plan to help better manage his MS. Patient would like to keep his hemoglobin up to help avoid complications and or readmission.      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  We treated you with a blood transfusion.  *For your AVF (fistula)  -You will need to have an ultrasound of your fistula once the swelling improves.  -Please see your vascular surgeon in 2-4 weeks -If you have an increase in LUE pain, changes in sensation, worse swelling, have a change in temperature in the left hand: please seek care at the emergency department immediatly  *For your anemia -The stomach doctors (GI) will call you with the results of the capsule and offer their recommendations at that time.  Continue taking your medications as previously prescribed    Educated patient on the importance of keeping all scheduled dialysis appointments as scheduled  Reviewed and discussed with patient his post hospital PCP follow up scheduled for for 10/31/23 @09 :15 AM Discussed plans with patient for ongoing care coordination follow up and provided patient with direct  contact information for nurse care coordinator      To complete brain and spinal MRI   On track    Care Coordination Interventions: Evaluation of current treatment plan related to Multiple Sclerosis  and patient's adherence to plan as established by provider Discussed and reviewed with patient he completed his thoracic/cervical MRI on 10/26/23 as directed, reviewed next scheduled upcoming brain MRI scheduled for 10/31/23 @10 :20 AM Reviewed and discussed with patient his next scheduled PCP post ED follow up scheduled for 10/31/23 @09 :15 AM Discussed with patient, possible scheduling conflict due to proximity in appointment times Counseled patient on the importance of keeping all scheduled appointments Discussed and reviewed with patient MD findings/recommendations per [name] Please call patient MRI of cervical and thoracic spine showed evidence of chronic demyelinating changes  Please give him a follow-up appointment after that to my next available to discuss treatment options IMPRESSION: MRI scan of cervical spine with and without contrast showing prominent spondylitic change at C6/7 resulting in mild canal and severe left and moderate right-sided foraminal narrowing.  C4-5 also shows spondylitic change with mild canal and bilateral foraminal narrowing.  Ill-defined spinal cord hyperintensities posteriorly and to the right at C5 and at T1-T2 represent remote age demyelinating plaques.  No enhancing lesions are noted on postcontrast images. IMPRESSION: MRI scan of thoracic spine with and without contrast showing ill-defined spinal cord hyperintensity at T1-T2 which may represent remote age demyelinating plaque.  No enhancing lesions are noted.  There is no frank cord compression  significant root or foraminal encroachment. Reviewed and discussed patient's next scheduled Neurology appointment with [name] scheduled for 11/11/23 @3 :30 PM    Interventions Today    Flowsheet Row Most Recent Value  Chronic  Disease   Chronic disease during today's visit Chronic Kidney Disease/End Stage Renal Disease (ESRD), Other  [normocytic anemia,  MS]  General Interventions   General Interventions Discussed/Reviewed General Interventions Discussed, General Interventions Reviewed, Labs, Doctor Visits, Communication with  Doctor Visits Discussed/Reviewed Doctor Visits Discussed, Doctor Visits Reviewed, PCP, Specialist  Communication with RN  Juanell Fairly RN]  Education Interventions   Education Provided Provided Education  Provided Verbal Education On Labs, Medication, When to see the doctor  Labs Reviewed --  [CBC]  Pharmacy Interventions   Pharmacy Dicussed/Reviewed Pharmacy Topics Discussed, Pharmacy Topics Reviewed, Medications and their functions          SDOH assessments and interventions completed:  Yes  SDOH Interventions Today    Flowsheet Row Most Recent Value  SDOH Interventions   Utilities Interventions AMB Referral        Care Coordination Interventions:  Yes, provided   Follow up plan: Follow up call scheduled for 11/20/23 @09 :00 AM    Encounter Outcome:  Patient Visit Completed

## 2023-10-29 NOTE — Patient Instructions (Signed)
 Visit Information  Thank you for taking time to visit with me today. Please don't hesitate to contact me if I can be of assistance to you.   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  We treated you with a blood transfusion.  *For your AVF (fistula)  -You will need to have an ultrasound of your fistula once the swelling improves.  -Please see your vascular surgeon in 2-4 weeks -If you have an increase in LUE pain, changes in sensation, worse swelling, have a change in temperature in the left hand: please seek care at the emergency department immediatly  *For your anemia -The stomach doctors (GI) will call you with the results of the capsule and offer their recommendations at that time.  Continue taking your medications as previously prescribed    Educated patient on the importance of keeping all scheduled dialysis appointments as scheduled  Reviewed and discussed with patient his post hospital PCP follow up scheduled for for 10/31/23 @09 :15 AM Discussed plans with patient for ongoing care coordination follow up and provided patient with direct contact information for nurse care coordinator      To complete brain and spinal MRI   On track    Care Coordination Interventions: Evaluation of current treatment plan related to Multiple Sclerosis  and patient's adherence to plan as established by provider Discussed and reviewed with  patient he completed his thoracic/cervical MRI on 10/26/23 as directed, reviewed next scheduled upcoming brain MRI scheduled for 10/31/23 @10 :20 AM Reviewed and discussed with patient his next scheduled PCP post ED follow up scheduled for 10/31/23 @09 :15 AM Discussed with patient, possible scheduling conflict due to proximity in appointment times Counseled patient on the importance of keeping all scheduled appointments Discussed and reviewed with patient MD findings/recommendations per [name] Please call patient MRI of cervical and thoracic spine showed evidence of chronic demyelinating changes  Please give him a follow-up appointment after that to my next available to discuss treatment options IMPRESSION: MRI scan of cervical spine with and without contrast showing prominent spondylitic change at C6/7 resulting in mild canal and severe left and moderate right-sided foraminal narrowing.  C4-5 also shows spondylitic change with mild canal and bilateral foraminal narrowing.  Ill-defined spinal cord hyperintensities posteriorly and to the right at C5 and at T1-T2 represent remote age demyelinating plaques.  No enhancing lesions are noted on postcontrast images. IMPRESSION: MRI scan of thoracic spine with and without contrast showing ill-defined spinal cord hyperintensity at T1-T2 which may represent remote age demyelinating plaque.  No enhancing lesions are noted.  There is no frank cord compression significant root or foraminal encroachment. Reviewed and discussed patient's next scheduled Neurology appointment with [name] scheduled for 11/11/23 @3 :30 PM        Our next appointment is by telephone on 11/20/23 at 09:00 AM  Please call the care guide team at [phone] if you need to cancel  or reschedule your appointment.   If you are experiencing a Mental Health or Behavioral Health Crisis or need someone to talk to, please call 1-800-273-TALK (toll free, 24 hour hotline)  Patient verbalizes  understanding of instructions and care plan provided today and agrees to view in MyChart. Active MyChart status and patient understanding of how to access instructions and care plan via MyChart confirmed with patient.     [name] RN BSN CCM [place]  [facility], [facility] Nurse Care Coordinator  Direct Dial: [phone] Website: [email or web address]

## 2023-10-30 ENCOUNTER — Telehealth: Payer: Self-pay | Admitting: *Deleted

## 2023-10-30 NOTE — Progress Notes (Signed)
 Complex Care Management Care Guide Note  10/30/2023 Name: George Day MRN: [id number] DOB: Dec 24, 1975  George Day is a 48 y.o. year old male who is a primary care patient of [name], MD and is actively engaged with the care management team. I reached out to [name] by phone today to assist with scheduling  with the BSW.  Follow up plan: Telephone appointment with complex care management team member scheduled for:  3/13  Gwenevere Ghazi  [facility]  [facility], [facility] Guide  Direct Dial: [phone]  Fax [phone]

## 2023-10-31 ENCOUNTER — Ambulatory Visit: Payer: Self-pay

## 2023-10-31 ENCOUNTER — Other Ambulatory Visit: Payer: 59

## 2023-10-31 ENCOUNTER — Telehealth: Payer: Self-pay

## 2023-10-31 ENCOUNTER — Encounter: Admitting: Student

## 2023-10-31 NOTE — Patient Outreach (Signed)
 Care Coordination   Initial Visit Note   10/31/2023 Name: George Day MRN: [id number] DOB: 04/24/1976  George Day is a 48 y.o. year old male who sees George Macadamia, MD for primary care. I spoke with  George Day by phone today.  What matters to the patients health and wellness today?  Patient needs resources to pay for power bill.  Patient has not paid power bill in 7 months and has nothing toward the bill.  Patient defaulted on a previous payment plan and now owes 1/2 of the total bill. Patient was not able to complete the assessment due to another appointment.   Goals Addressed             This Visit's Progress    Care Coordination Activities       Interventions Today    Flowsheet Row Most Recent Value  Chronic Disease   Chronic disease during today's visit Diabetes, Chronic Obstructive Pulmonary Disease (COPD), Chronic Kidney Disease/End Stage Renal Disease (ESRD), Hypertension (HTN)  General Interventions   General Interventions Discussed/Reviewed General Interventions Discussed, General Interventions Reviewed, Communication with  [Pt wife just started working and pt receives disability.Rent is $1200.Pt has nothing toward the utility bill.SW refers pt to [employer], [employer].No family to assist.Wife is paid next week.]  Communication with --  [SW t/c [employer].Pt owes 2106.02 and needs to pay 1053.46 by 3/19 (with installments of 210.69 for 6 months).Pt has not made a payment since August 2024.No other extensions or payment plans will be offered.]              SDOH assessments and interventions completed:  No     Care Coordination Interventions:  Yes, provided   Follow up plan: Follow up call scheduled for 11/07/23 at 3pm.    Encounter Outcome:  Patient Visit Completed

## 2023-10-31 NOTE — Telephone Encounter (Signed)
 Patient contacted and advised the following; Complete capsule endoscopy Negative study with no findings to explain anemia. Copy faxed to PCP. Questions invited. No questions.

## 2023-10-31 NOTE — Patient Instructions (Signed)
 Visit Information  Thank you for taking time to visit with me today. Please don't hesitate to contact me if I can be of assistance to you.   Following are the goals we discussed today:  Patient will contact community resources for financial assistance and contact [employer] by the 19th for payment.   Our next appointment is by telephone on 11/07/23 at 3pm  Please call the care guide team at [phone] if you need to cancel or reschedule your appointment.   If you are experiencing a Mental Health or Behavioral Health Crisis or need someone to talk to, please call 911  Patient verbalizes understanding of instructions and care plan provided today and agrees to view in MyChart. Active MyChart status and patient understanding of how to access instructions and care plan via MyChart confirmed with patient.     Telephone follow up appointment with care management team member scheduled for:11/07/23 at 3pm.   Lysle Morales, BSW [place]  [facility], [facility] Social Worker Direct Dial: [phone]  Fax: [phone] Website: [email or web address]

## 2023-11-03 NOTE — Progress Notes (Deleted)
 New Patient Note  RE: Demarkus Remmel MRN: [id number] DOB: 1975-12-02 Date of Office Visit: 11/04/2023  Consult requested by: Tyson Alias Primary care provider: [name], MD  Chief Complaint: No chief complaint on file.  History of Present Illness: I had the pleasure of seeing Truxton Stupka for initial evaluation at the [facility] on 11/03/2023. He is a 48 y.o. male, who is referred here by [name], MD for the evaluation of ***.  Discussed the use of AI scribe software for clinical note transcription with the patient, who gave verbal consent to proceed.  History of Present Illness             ***  Assessment and Plan: Adren is a 48 y.o. male with: ***  Assessment and Plan               No follow-ups on file.  No orders of the defined types were placed in this encounter.  Lab Orders  No laboratory test(s) ordered today    Other allergy screening: Asthma: {Blank single:19197::"yes","no"} Rhino conjunctivitis: {Blank single:19197::"yes","no"} Food allergy: {Blank single:19197::"yes","no"} Medication allergy: {Blank single:19197::"yes","no"} Hymenoptera allergy: {Blank single:19197::"yes","no"} Urticaria: {Blank single:19197::"yes","no"} Eczema:{Blank single:19197::"yes","no"} History of recurrent infections suggestive of immunodeficency: {Blank single:19197::"yes","no"}  Diagnostics: Spirometry:  Tracings reviewed. His effort: {Blank single:19197::"Good reproducible efforts.","It was hard to get consistent efforts and there is a question as to whether this reflects a maximal maneuver.","Poor effort, data can not be interpreted."} FVC: ***L FEV1: ***L, ***% predicted FEV1/FVC ratio: ***% Interpretation: {Blank single:19197::"Spirometry consistent with mild obstructive disease","Spirometry consistent with moderate obstructive disease","Spirometry consistent with severe obstructive disease","Spirometry  consistent with possible restrictive disease","Spirometry consistent with mixed obstructive and restrictive disease","Spirometry uninterpretable due to technique","Spirometry consistent with normal pattern","No overt abnormalities noted given today's efforts"}.  Please see scanned spirometry results for details.  Skin Testing: {Blank single:19197::"Select foods","Environmental allergy panel","Environmental allergy panel and select foods","Food allergy panel","None","Deferred due to recent antihistamines use"}. *** Results discussed with patient/family.   Past Medical History: Patient Active Problem List   Diagnosis Date Noted  . Anemia of chronic disease 10/21/2023  . Peptic ulcer disease 10/21/2023  . Symptomatic anemia 10/20/2023  . GI bleed 10/02/2023  . History of anaphylaxis 09/27/2023  . Peripheral neuropathy 09/19/2023  . ESRD on dialysis (HCC) 09/19/2023  . Complex care coordination 09/19/2023  . Paresthesia 07/24/2023  . Gait abnormality 06/13/2023  . Multiple sclerosis (HCC) 03/04/2023  . (HFpEF) heart failure with preserved ejection fraction (HCC) 03/04/2023  . Chronic kidney disease (CKD), stage V (HCC) 03/03/2023  . Chronic diastolic (congestive) heart failure (HCC) 02/13/2023  . Elevated troponin 02/13/2023  . COPD (chronic obstructive pulmonary disease) (HCC) 02/13/2023  . Chronic diastolic heart failure (HCC) 10/12/2022  . Chronic focal neurological deficit 10/12/2022  . Hypervolemia 09/17/2022  . Anasarca 09/17/2022  . Hematochezia 08/18/2022  . Pericardial effusion 08/17/2022  . Coronary artery disease 08/17/2022  . Homelessness 08/16/2022  . Hypertensive urgency 02/19/2021  . Hypokalemia   . Accelerated hypertension 02/16/2021  . Right adrenal mass (HCC) 11/28/2020  . Anemia 08/31/2020  . Former smoker 08/29/2020  . Hyperlipidemia associated with type 2 diabetes mellitus (HCC) 08/29/2020  . Iron deficiency anemia 08/29/2020  . Influenza vaccination  declined 08/29/2020  . 23-polyvalent pneumococcal polysaccharide vaccine declined 08/29/2020  . Chest pain 08/08/2020  . GAD (generalized anxiety disorder) 02/25/2020  . Hyperlipidemia 02/25/2020  . ESRD (end stage renal disease) on dialysis (HCC) 02/25/2020  . Diabetes mellitus (HCC) 01/05/2020  . Essential  hypertension 01/05/2020  . Tobacco dependence 01/05/2020  . Coronary artery disease involving native coronary artery of native heart without angina pectoris 01/05/2020  . History of CVA with residual deficit 01/05/2020  . Moderate persistent asthma without complication 01/05/2020  . Class 1 obesity due to excess calories with serious comorbidity and body mass index (BMI) of 32.0 to 32.9 in adult 01/05/2020   Past Medical History:  Diagnosis Date  . (HFpEF) heart failure with preserved ejection fraction (HCC)   . Acute kidney injury superimposed on chronic kidney disease (HCC)   . Acute respiratory distress 02/13/2023  . Anemia 1998  . Anginal pain (HCC)   . Anxiety   . Asthma   . CKD (chronic kidney disease) stage 5, GFR less than 15 ml/min (HCC)   . Coronary artery disease   . Diabetes mellitus without complication (HCC) 1995  . Hypertension 2019  . MI (myocardial infarction) (HCC) 2010  . Multiple sclerosis (HCC)   . Sleep apnea   . Stroke [facility]) 2010   Past Surgical History: Past Surgical History:  Procedure Laterality Date  . AV FISTULA PLACEMENT Left 04/24/2023   Procedure: INSERTION OF LEFT ARM ARTERIOVENOUS (AV) GOR-TEX GRAFT;  Surgeon: Leonie Douglas, MD;  Location: [facility] OR;  Service: Vascular;  Laterality: Left;  with regional block  . BIOPSY  08/18/2022   Procedure: BIOPSY;  Surgeon: Imogene Burn, MD;  Location: [facility] ENDOSCOPY;  Service: Gastroenterology;;  . BIOPSY  10/03/2023   Procedure: BIOPSY;  Surgeon: Beverley Fiedler, MD;  Location: [facility] ENDOSCOPY;  Service: Gastroenterology;;  . CARDIAC CATHETERIZATION  2010  . COLONOSCOPY WITH PROPOFOL N/A 08/18/2022    Procedure: COLONOSCOPY WITH PROPOFOL;  Surgeon: Imogene Burn, MD;  Location: [facility] ENDOSCOPY;  Service: Gastroenterology;  Laterality: N/A;  . ESOPHAGOGASTRODUODENOSCOPY (EGD) WITH PROPOFOL N/A 08/18/2022   Procedure: ESOPHAGOGASTRODUODENOSCOPY (EGD) WITH PROPOFOL;  Surgeon: Imogene Burn, MD;  Location: [facility] ENDOSCOPY;  Service: Gastroenterology;  Laterality: N/A;  . ESOPHAGOGASTRODUODENOSCOPY (EGD) WITH PROPOFOL N/A 10/03/2023   Procedure: ESOPHAGOGASTRODUODENOSCOPY (EGD) WITH PROPOFOL;  Surgeon: Beverley Fiedler, MD;  Location: [facility] ENDOSCOPY;  Service: Gastroenterology;  Laterality: N/A;  . GIVENS CAPSULE STUDY N/A 10/22/2023   Procedure: GIVENS CAPSULE STUDY;  Surgeon: Napoleon Form, MD;  Location: [facility] ENDOSCOPY;  Service: Gastroenterology;  Laterality: N/A;  . IR FLUORO GUIDE CV LINE RIGHT  03/05/2023  . IR US GUIDE VASC ACCESS RIGHT  03/05/2023  . POLYPECTOMY  08/18/2022   Procedure: POLYPECTOMY;  Surgeon: Imogene Burn, MD;  Location: [facility] ENDOSCOPY;  Service: Gastroenterology;;   Medication List:  Current Outpatient Medications  Medication Sig Dispense Refill  . amLODipine (NORVASC) 10 MG tablet TAKE 1 TABLET BY MOUTH DAILY 30 tablet 10  . aspirin EC 81 MG tablet Take 1 tablet (81 mg total) by mouth daily. 90 tablet 1  . atorvastatin (LIPITOR) 40 MG tablet TAKE 1 TABLET BY MOUTH DAILY 30 tablet 10  . carvedilol (COREG) 25 MG tablet TAKE TWO (2) TABLETS BY MOUTH TWICE DAILY (Patient taking differently: Take 25 mg by mouth daily.) 120 tablet 10  . DULoxetine (CYMBALTA) 30 MG capsule Take 1 capsule (30 mg total) by mouth daily. (Patient not taking: Reported on 10/02/2023) 30 capsule 11  . EPINEPHrine 0.3 mg/0.3 mL IJ SOAJ injection Inject 0.3 mg into the muscle as needed for anaphylaxis.    . famotidine (PEPCID) 20 MG tablet Take 20 mg by mouth daily.    Marland Kitchen gabapentin (NEURONTIN) 100 MG capsule Take 100mg , three  times a week only following dialysis. 30 capsule 3  . nitroGLYCERIN (NITROSTAT) 0.6 MG  SL tablet Place 1 tablet (0.6 mg total) under the tongue every 5 (five) minutes as needed for chest pain. 100 tablet 5  . olmesartan (BENICAR) 20 MG tablet Take 1 tablet (20 mg total) by mouth at bedtime for high blood pressure. 30 tablet 11  . pantoprazole (PROTONIX) 40 MG tablet Take 1 tablet (40 mg total) by mouth daily. 30 tablet 0  . sevelamer carbonate (RENVELA) 800 MG tablet Take 1 tablet (800 mg total) by mouth 3 (three) times daily with meals. 270 tablet 3  . TRELEGY ELLIPTA 100-62.5-25 MCG/ACT AEPB INHALE ONE (1) PUFF BY MOUTH DAILY (Patient taking differently: Take 2 puffs by mouth daily.) 60 each 10   No current facility-administered medications for this visit.   Allergies: Allergies  Allergen Reactions  . Honey Bee Venom [Bee Venom] Anaphylaxis  . Penicillins Anaphylaxis    ALL  . Tomato Swelling   Social History: Social History   Socioeconomic History  . Marital status: Married    Spouse name: Not on file  . Number of children: 4  . Years of education: Not on file  . Highest education level: 10th grade  Occupational History  . Occupation: Hydrologist  Tobacco Use  . Smoking status: Former    Current packs/day: 0.00    Average packs/day: 0.5 packs/day for 31.0 years (15.5 ttl pk-yrs)    Types: Cigars, E-cigarettes, Cigarettes    Start date: 09/1990    Quit date: 09/2021    Years since quitting: 2.1  . Smokeless tobacco: Never  Vaping Use  . Vaping status: Never Used  Substance and Sexual Activity  . Alcohol use: Not Currently    Comment: occasioanlly   . Drug use: Not Currently    Types: Marijuana    Comment: 2 x week  . Sexual activity: Yes    Birth control/protection: None  Other Topics Concern  . Not on file  Social History Narrative  . Not on file   Social Drivers of Health   Financial Resource Strain: Medium Risk (09/20/2022)   Overall Financial Resource Strain (CARDIA)   . Difficulty of Paying Living Expenses: Somewhat hard  Food  Insecurity: Food Insecurity Present (10/20/2023)   Hunger Vital Sign   . Worried About Programme researcher, broadcasting/film/video in the Last Year: Often true   . Ran Out of Food in the Last Year: Sometimes true  Transportation Needs: No Transportation Needs (10/20/2023)   PRAPARE - Transportation   . Lack of Transportation (Medical): No   . Lack of Transportation (Non-Medical): No  Recent Concern: Transportation Needs - Unmet Transportation Needs (09/18/2023)   PRAPARE - Transportation   . Lack of Transportation (Medical): Yes   . Lack of Transportation (Non-Medical): Yes  Physical Activity: Not on file  Stress: Not on file  Social Connections: Moderately Integrated (10/20/2023)   Social Connection and Isolation Panel [NHANES]   . Frequency of Communication with Friends and Family: More than three times a week   . Frequency of Social Gatherings with Friends and Family: Three times a week   . Attends Religious Services: Never   . Active Member of Clubs or Organizations: No   . Attends Banker Meetings: 1 to 4 times per year   . Marital Status: Married   Lives in a ***. Smoking: *** Occupation: ***  Environmental History: Water Damage/mildew in the house: Copywriter, advertising in the  family room: {Blank single:19197::"yes","no"} Carpet in the bedroom: {Blank single:19197::"yes","no"} Heating: {Blank single:19197::"electric","gas","heat pump"} Cooling: {Blank single:19197::"central","window","heat pump"} Pet: {Blank single:19197::"yes ***","no"}  Family History: Family History  Problem Relation Age of Onset  . Diabetes Mother   . Hyperlipidemia Mother   . Kidney disease Mother   . Drug abuse Father   . Diabetes Sister   . Hyperlipidemia Sister   . Hearing loss Sister   . Diabetes Brother   . Hyperlipidemia Brother    Problem                               Relation Asthma                                   *** Eczema                                *** Food allergy                           *** Allergic rhino conjunctivitis     ***  Review of Systems  Constitutional:  Negative for appetite change, chills, fever and unexpected weight change.  HENT:  Negative for congestion and rhinorrhea.   Eyes:  Negative for itching.  Respiratory:  Negative for cough, chest tightness, shortness of breath and wheezing.   Cardiovascular:  Negative for chest pain.  Gastrointestinal:  Negative for abdominal pain.  Genitourinary:  Negative for difficulty urinating.  Skin:  Negative for rash.  Neurological:  Negative for headaches.   Objective: There were no vitals taken for this visit. There is no height or weight on file to calculate BMI. Physical Exam Vitals and nursing note reviewed.  Constitutional:      Appearance: Normal appearance. He is well-developed.  HENT:     Head: Normocephalic and atraumatic.     Right Ear: Tympanic membrane and external ear normal.     Left Ear: Tympanic membrane and external ear normal.     Nose: Nose normal.     Mouth/Throat:     Mouth: Mucous membranes are moist.     Pharynx: Oropharynx is clear.  Eyes:     Conjunctiva/sclera: Conjunctivae normal.  Cardiovascular:     Rate and Rhythm: Normal rate and regular rhythm.     Heart sounds: Normal heart sounds. No murmur heard.    No friction rub. No gallop.  Pulmonary:     Effort: Pulmonary effort is normal.     Breath sounds: Normal breath sounds. No wheezing, rhonchi or rales.  Musculoskeletal:     Cervical back: Neck supple.  Skin:    General: Skin is warm.     Findings: No rash.  Neurological:     Mental Status: He is alert and oriented to person, place, and time.  Psychiatric:        Behavior: Behavior normal.  The plan was reviewed with the patient/family, and all questions/concerned were addressed.  It was my pleasure to see Rony today and participate in his care. Please feel free to contact me with any questions or concerns.  Sincerely,  Wyline Mood, DO Allergy &  Immunology  [facility] office: [phone] [facility] office: [phone]

## 2023-11-04 ENCOUNTER — Ambulatory Visit: Payer: 59 | Admitting: Allergy

## 2023-11-05 ENCOUNTER — Ambulatory Visit: Admitting: Student

## 2023-11-05 VITALS — BP 148/78 | HR 79 | Temp 97.8°F | Ht 68.0 in | Wt 204.4 lb

## 2023-11-05 DIAGNOSIS — K922 Gastrointestinal hemorrhage, unspecified: Secondary | ICD-10-CM

## 2023-11-05 DIAGNOSIS — I10 Essential (primary) hypertension: Secondary | ICD-10-CM

## 2023-11-05 DIAGNOSIS — I132 Hypertensive heart and chronic kidney disease with heart failure and with stage 5 chronic kidney disease, or end stage renal disease: Secondary | ICD-10-CM

## 2023-11-05 DIAGNOSIS — N186 End stage renal disease: Secondary | ICD-10-CM

## 2023-11-05 DIAGNOSIS — Z992 Dependence on renal dialysis: Secondary | ICD-10-CM

## 2023-11-05 DIAGNOSIS — M21371 Foot drop, right foot: Secondary | ICD-10-CM | POA: Diagnosis not present

## 2023-11-05 DIAGNOSIS — K5900 Constipation, unspecified: Secondary | ICD-10-CM | POA: Diagnosis not present

## 2023-11-05 DIAGNOSIS — Z7189 Other specified counseling: Secondary | ICD-10-CM

## 2023-11-05 MED ORDER — POLYETHYLENE GLYCOL 3350 17 G PO PACK: 17.0000 g | PACK | Freq: Every day | ORAL | 3 refills | Status: AC

## 2023-11-05 NOTE — Assessment & Plan Note (Signed)
 Elevated in office today, improved from prior.  He has not taken his blood pressure medication this morning.

## 2023-11-05 NOTE — Assessment & Plan Note (Signed)
 Ongoing for some time, has tried MiraLAX in the past with success.  Requesting medication at this time.  Endorses hard pellet-like stools every 4 days.  Does have history of hemorrhoids as well. Plan: MiraLAX

## 2023-11-05 NOTE — Progress Notes (Signed)
 Subjective:  CC: Hospital follow-up  HPI:  [name] is a 48 y.o. person with a past medical history stated below and presents today for the stated chief complaint. Please see problem based assessment and plan for additional details.  Past Medical History:  Diagnosis Date   (HFpEF) heart failure with preserved ejection fraction (HCC)    Acute kidney injury superimposed on chronic kidney disease (HCC)    Acute respiratory distress 02/13/2023   Anemia 1998   Anginal pain (HCC)    Anxiety    Asthma    CKD (chronic kidney disease) stage 5, GFR less than 15 ml/min (HCC)    Coronary artery disease    Diabetes mellitus without complication (HCC) 1995   Hypertension 2019   MI (myocardial infarction) (HCC) 2010   Multiple sclerosis (HCC)    Sleep apnea    Stroke (HCC) 2010    Current Outpatient Medications on File Prior to Visit  Medication Sig Dispense Refill   amLODipine (NORVASC) 10 MG tablet TAKE 1 TABLET BY MOUTH DAILY 30 tablet 10   aspirin EC 81 MG tablet Take 1 tablet (81 mg total) by mouth daily. 90 tablet 1   atorvastatin (LIPITOR) 40 MG tablet TAKE 1 TABLET BY MOUTH DAILY 30 tablet 10   carvedilol (COREG) 25 MG tablet TAKE TWO (2) TABLETS BY MOUTH TWICE DAILY (Patient taking differently: Take 25 mg by mouth daily.) 120 tablet 10   DULoxetine (CYMBALTA) 30 MG capsule Take 1 capsule (30 mg total) by mouth daily. (Patient not taking: Reported on 10/02/2023) 30 capsule 11   EPINEPHrine 0.3 mg/0.3 mL IJ SOAJ injection Inject 0.3 mg into the muscle as needed for anaphylaxis.     famotidine (PEPCID) 20 MG tablet Take 20 mg by mouth daily.     gabapentin (NEURONTIN) 100 MG capsule Take 100mg , three times a week only following dialysis. 30 capsule 3   nitroGLYCERIN (NITROSTAT) 0.6 MG SL tablet Place 1 tablet (0.6 mg total) under the tongue every 5 (five) minutes as needed for chest pain. 100 tablet 5   olmesartan (BENICAR) 20 MG tablet Take 1 tablet (20 mg total) by mouth  at bedtime for high blood pressure. 30 tablet 11   pantoprazole (PROTONIX) 40 MG tablet Take 1 tablet (40 mg total) by mouth daily. 30 tablet 0   sevelamer carbonate (RENVELA) 800 MG tablet Take 1 tablet (800 mg total) by mouth 3 (three) times daily with meals. 270 tablet 3   TRELEGY ELLIPTA 100-62.5-25 MCG/ACT AEPB INHALE ONE (1) PUFF BY MOUTH DAILY (Patient taking differently: Take 2 puffs by mouth daily.) 60 each 10   No current facility-administered medications on file prior to visit.    Review of Systems: Please see assessment and plan for pertinent positives and negatives.  Objective:   Vitals:   11/05/23 0914  BP: (!) 161/85  Pulse: 79  Temp: 97.8 F (36.6 C)  TempSrc: Oral  SpO2: 100%  Weight: 204 lb 6.4 oz (92.7 kg)  Height: 5\' 8"  (1.727 m)    Physical Exam: Constitutional: Well-appearing Cardiovascular: Regular rate and rhythm Pulmonary/Chest: lungs clear to auscultation bilaterally MSK: Weakness with right plantarflexion and dorsiflexion. Extremities: No edema of the lower extremities bilaterally.  Left AV fistula with palpable thrill Psych: Pleasant affect Thought process is linear and is goal-directed.     Assessment & Plan:  Right foot drop Limiting mobility.  He has known spinal lesions, weakness on exam.  He says his foot drags when trying to  walk and is difficult for him.  While in the hospital they had wrapped his ankle with Ace bandage to help prevent foot drop which he found very helpful. Plan: DME referral for ankle brace for foot drop   Constipation Ongoing for some time, has tried MiraLAX in the past with success.  Requesting medication at this time.  Endorses hard pellet-like stools every 4 days.  Does have history of hemorrhoids as well. Plan: MiraLAX   Essential hypertension Elevated in office today, improved from prior.  He has not taken his blood pressure medication this morning.  GI bleed No bleeding since last hospital admission.   Capsule endoscopy was negative which was discussed with the patient.  Gets lab work done routinely at dialysis center.  ESRD on dialysis [facility]) Continue to go to dialysis, they are using his AV fistula for vascular access.  He should follow with vascular surgery for evaluation and imaging of the AV fistula.  Phone number for vascular surgery provided to the patient.  Complex care coordination He is continue to follow-up with [facility], I have again provided him with the phone number for the [facility] today for him to call to try and help coordinate some of his care.  Discussed his upcoming appointments with him, he will see neurology in the coming days.   Patient discussed with Dr. Alver Sorrow MD [facility] Internal Medicine  PGY-1 Pager: [phone]  Phone: [phone] Date 11/05/2023  Time 9:44 AM

## 2023-11-05 NOTE — Assessment & Plan Note (Signed)
 He is continue to follow-up with [facility], I have again provided him with the phone number for the [facility] today for him to call to try and help coordinate some of his care.  Discussed his upcoming appointments with him, he will see neurology in the coming days.

## 2023-11-05 NOTE — Assessment & Plan Note (Signed)
 Continue to go to dialysis, they are using his AV fistula for vascular access.  He should follow with vascular surgery for evaluation and imaging of the AV fistula.  Phone number for vascular surgery provided to the patient.

## 2023-11-05 NOTE — Assessment & Plan Note (Signed)
 Limiting mobility.  He has known spinal lesions, weakness on exam.  He says his foot drags when trying to walk and is difficult for him.  While in the hospital they had wrapped his ankle with Ace bandage to help prevent foot drop which he found very helpful. Plan: DME referral for ankle brace for foot drop

## 2023-11-05 NOTE — Patient Instructions (Addendum)
 Thank you, Mr.Georgie Mckiddy for allowing [place] to provide your care today.   Referrals ordered today:    Referral Orders         Ambulatory Referral for DME      I have ordered the following medication/changed the following medications:   Start the following medications: Meds ordered this encounter  Medications   polyethylene glycol (MIRALAX) 17 g packet    Sig: Take 17 g by mouth daily.    Dispense:  100 each    Refill:  3       Follow up: 3 months for routine follow up  Please remember: Call:  Dione Booze  [facility] Direct Dial:[phone]   [facility] at [facility] [address] [place],  [place]  [address] Main: [phone]   We look forward to seeing you next time. Please call our clinic at [phone] if you have any questions or concerns. The best time to call is Monday-Friday from 9am-4pm, but there is someone available 24/7. If after hours or the weekend, call the main hospital number and ask for the Internal Medicine Resident On-Call. If you need medication refills, please notify your pharmacy one week in advance and they will send [place] a request.   Thank you for trusting me with your care. Wishing you the best!  [name] MD [facility]

## 2023-11-05 NOTE — Assessment & Plan Note (Signed)
 No bleeding since last hospital admission.  Capsule endoscopy was negative which was discussed with the patient.  Gets lab work done routinely at dialysis center.

## 2023-11-07 ENCOUNTER — Ambulatory Visit: Payer: Self-pay

## 2023-11-07 NOTE — Patient Outreach (Signed)
 Care Coordination   Follow Up Visit Note   11/07/2023 Name: Jame Morrell MRN: [id number] DOB: 25-Dec-1975  Paulino Cork Salvucci is a 48 y.o. year old male who sees [name], MD for primary care. I spoke with  [name] by phone today.  What matters to the patients health and wellness today?  Patient was able to pay power bill to avoid disconnection.    Goals Addressed             This Visit's Progress    Care Coordination Activities       Interventions Today    Flowsheet Row Most Recent Value  Chronic Disease   Chronic disease during today's visit Diabetes, Chronic Obstructive Pulmonary Disease (COPD), Chronic Kidney Disease/End Stage Renal Disease (ESRD), Hypertension (HTN)  General Interventions   General Interventions Discussed/Reviewed General Interventions Discussed, General Interventions Reviewed  [Pt was to obtain the funds needed to avoid disconnection of his power.Pt has the paperwork to apply for [employer] and will return to [employer] on the first to apply for April balance.Pt wife is paid next week.]              SDOH assessments and interventions completed:  No     Care Coordination Interventions:  Yes, provided   Follow up plan: No further intervention required.   Encounter Outcome:  Patient Visit Completed

## 2023-11-07 NOTE — Patient Instructions (Signed)
 Visit Information  Thank you for taking time to visit with me today. Please don't hesitate to contact me if I can be of assistance to you.   Following are the goals we discussed today:  Patient obtained payment to avoid disconnection. Patient will contact community resources to apply for April power bill.     If you are experiencing a Mental Health or Behavioral Health Crisis or need someone to talk to, please call 911  Patient verbalizes understanding of instructions and care plan provided today and agrees to view in MyChart. Active MyChart status and patient understanding of how to access instructions and care plan via MyChart confirmed with patient.     No further follow up required: Patient does not request a follow up visit.  Lysle Morales, BSW [place]  [facility], [facility] Social Worker Direct Dial: [phone]  Fax: [phone] Website: [email or web address]

## 2023-11-11 ENCOUNTER — Ambulatory Visit: Admitting: Neurology

## 2023-11-11 ENCOUNTER — Encounter: Payer: Self-pay | Admitting: Neurology

## 2023-11-11 NOTE — Progress Notes (Signed)
 Internal Medicine Clinic Attending  Case discussed with the resident at the time of the visit.  We reviewed the resident's history and exam and pertinent patient test results.  I agree with the assessment, diagnosis, and plan of care documented in the resident's note.

## 2023-11-11 NOTE — Addendum Note (Signed)
 Addended by: Burnell Blanks on: 11/11/2023 02:13 PM   Modules accepted: Level of Service

## 2023-11-15 ENCOUNTER — Inpatient Hospital Stay: Admission: RE | Admit: 2023-11-15 | Source: Ambulatory Visit

## 2023-11-18 ENCOUNTER — Encounter: Payer: Self-pay | Admitting: Gastroenterology

## 2023-11-18 ENCOUNTER — Encounter (HOSPITAL_COMMUNITY): Payer: Self-pay | Admitting: Nephrology

## 2023-11-19 ENCOUNTER — Other Ambulatory Visit: Payer: Self-pay

## 2023-11-19 ENCOUNTER — Ambulatory Visit (HOSPITAL_COMMUNITY)
Admission: RE | Admit: 2023-11-19 | Discharge: 2023-11-19 | Disposition: A | Discharge status: Home / Self Care | Attending: Nephrology | Admitting: Nephrology

## 2023-11-19 ENCOUNTER — Encounter (HOSPITAL_COMMUNITY)
Admission: RE | Disposition: A | Discharge status: Home / Self Care | Payer: Self-pay | Source: Home / Self Care | Attending: Nephrology

## 2023-11-19 ENCOUNTER — Encounter (HOSPITAL_COMMUNITY): Payer: Self-pay | Admitting: Nephrology

## 2023-11-19 DIAGNOSIS — N186 End stage renal disease: Secondary | ICD-10-CM | POA: Insufficient documentation

## 2023-11-19 DIAGNOSIS — Y832 Surgical operation with anastomosis, bypass or graft as the cause of abnormal reaction of the patient, or of later complication, without mention of misadventure at the time of the procedure: Secondary | ICD-10-CM | POA: Diagnosis not present

## 2023-11-19 DIAGNOSIS — I132 Hypertensive heart and chronic kidney disease with heart failure and with stage 5 chronic kidney disease, or end stage renal disease: Secondary | ICD-10-CM | POA: Diagnosis not present

## 2023-11-19 DIAGNOSIS — Z87891 Personal history of nicotine dependence: Secondary | ICD-10-CM | POA: Diagnosis not present

## 2023-11-19 DIAGNOSIS — Z992 Dependence on renal dialysis: Secondary | ICD-10-CM | POA: Insufficient documentation

## 2023-11-19 DIAGNOSIS — Z79899 Other long term (current) drug therapy: Secondary | ICD-10-CM | POA: Insufficient documentation

## 2023-11-19 DIAGNOSIS — M7989 Other specified soft tissue disorders: Secondary | ICD-10-CM | POA: Diagnosis not present

## 2023-11-19 DIAGNOSIS — D631 Anemia in chronic kidney disease: Secondary | ICD-10-CM | POA: Diagnosis not present

## 2023-11-19 DIAGNOSIS — N2581 Secondary hyperparathyroidism of renal origin: Secondary | ICD-10-CM | POA: Insufficient documentation

## 2023-11-19 DIAGNOSIS — E1122 Type 2 diabetes mellitus with diabetic chronic kidney disease: Secondary | ICD-10-CM | POA: Diagnosis not present

## 2023-11-19 DIAGNOSIS — T82858A Stenosis of vascular prosthetic devices, implants and grafts, initial encounter: Secondary | ICD-10-CM | POA: Insufficient documentation

## 2023-11-19 DIAGNOSIS — I5032 Chronic diastolic (congestive) heart failure: Secondary | ICD-10-CM | POA: Insufficient documentation

## 2023-11-19 SURGERY — A/V SHUNT INTERVENTION
Anesthesia: LOCAL

## 2023-11-19 MED ORDER — SODIUM CHLORIDE 0.9 % IV SOLN: INTRAVENOUS | Status: DC

## 2023-11-19 MED ORDER — MIDAZOLAM HCL 2 MG/2ML IJ SOLN
INTRAMUSCULAR | Status: DC | PRN
  Administered 2023-11-19: 1 mg via INTRAVENOUS

## 2023-11-19 MED ORDER — LIDOCAINE HCL (PF) 1 % IJ SOLN
INTRAMUSCULAR | Status: DC | PRN
  Administered 2023-11-19: 2 mL via SUBCUTANEOUS

## 2023-11-19 MED ORDER — LIDOCAINE HCL (PF) 1 % IJ SOLN
INTRAMUSCULAR | Status: AC
  Filled 2023-11-19: qty 30

## 2023-11-19 MED ORDER — IODIXANOL 320 MG/ML IV SOLN
INTRAVENOUS | Status: DC | PRN
  Administered 2023-11-19: 8 mL via INTRAVENOUS

## 2023-11-19 MED ORDER — HEPARIN (PORCINE) IN NACL 1000-0.9 UT/500ML-% IV SOLN
INTRAVENOUS | Status: DC | PRN
  Administered 2023-11-19: 500 mL

## 2023-11-19 MED ORDER — FENTANYL CITRATE (PF) 100 MCG/2ML IJ SOLN
INTRAMUSCULAR | Status: DC | PRN
  Administered 2023-11-19: 25 ug via INTRAVENOUS

## 2023-11-19 MED ORDER — FENTANYL CITRATE (PF) 100 MCG/2ML IJ SOLN
INTRAMUSCULAR | Status: AC
  Filled 2023-11-19: qty 2

## 2023-11-19 MED ORDER — MIDAZOLAM HCL 2 MG/2ML IJ SOLN
INTRAMUSCULAR | Status: AC
  Filled 2023-11-19: qty 2

## 2023-11-19 SURGICAL SUPPLY — 9 items
BAG SNAP BAND KOVER 36X36 (MISCELLANEOUS) ×2 IMPLANT
BALLN MUSTANG 8.0X40 75 (BALLOONS) ×2 IMPLANT
BALLOON MUSTANG 8.0X40 75 (BALLOONS) IMPLANT
COVER DOME SNAP 22 D (MISCELLANEOUS) ×2 IMPLANT
GUIDEWIRE ANGLED .035 180CM (WIRE) IMPLANT
KIT MICROPUNCTURE NIT STIFF (SHEATH) IMPLANT
SHEATH PINNACLE R/O II 6F 4CM (SHEATH) IMPLANT
SYR MEDALLION 10ML (SYRINGE) IMPLANT
TRAY PV CATH (CUSTOM PROCEDURE TRAY) ×2 IMPLANT

## 2023-11-19 NOTE — Interval H&P Note (Signed)
 History and Physical Interval Note:  11/19/2023 10:07 AM  George Day  has presented today for surgery, with the diagnosis of difficult canulation.  The various methods of treatment have been discussed with the patient and family. After consideration of risks, benefits and other options for treatment, the patient has consented to  Procedure(s): A/V SHUNT INTERVENTION (N/A) as a surgical intervention.  The patient's history has been reviewed, patient examined, no change in status, stable for surgery.  I have reviewed the patient's chart and labs.  Questions were answered to the patient's satisfaction.     Dagoberto Ligas

## 2023-11-19 NOTE — Op Note (Signed)
 Patient presents with cannulation difficulties of his left upper arm AV graft that was created in September 2024.  This is his first endovascular intervention and he has a right IJ TDC in place. On exam the LUA straight AVG is mildly hyperpulsatile.   Summary:  1) Successful angiogram of a left upper arm straight AVG with evidence of a 60% venous anastomosis stenosis treated to 20% with an 8 mm Mustang FE ~18 atm.   2) The body of the graft, left axillary and central veins are patent.  Patent inflow graft and arterial anastomosis seen on reflux arteriogram. 3) This left upper arm straight graft remains amenable to future percutaneous intervention.   Description of procedure: The left upper arm was prepped and draped in the usual fashion. The left upper arm straight AVG was cannulated (96045) in the arterial limb of the graft in an antegrade direction with a 21G micropuncture needle and then a 6 Fr sheath was inserted by guidewire exchange technique. The angiogram revealed a 60% venous anastomosis stenosis. The body of the graft, left axillary and left central veins were patent.  Reflux arteriogram showed that the arterial anastomosis and inflow graft were patent.  A guidewire was easily advanced past the outflow stenosis and parked in the central veins. I then advanced an 8 x 40 Mustang angioplasty balloon through the antegrade sheath over the guidewire to the level of the proximal intragraft and venous anastomosis stenosis. Venous angioplasty was performed to 100% balloon effacement with approximately 18 ATM of pressure via a hand syringe assembly.   Final arteriogram and completion venogram revealed no evidence of extravasation or dissection, more rapid access flows through the graft and 10% residual stenosis in the venous anastomosis.  Hemostasis: A 3-0 ethilon purse string suture was placed at the cannulation site on removal of the sheath.  Sedation: None  Contrast. 10 mL  Monitoring: Because  of the patient's comorbid conditions and sedation during the procedure, continuous EKG monitoring and O2 saturation monitoring was performed throughout the procedure by the RN. There were no abnormal arrhythmias encountered.  Complications: None.   Diagnoses: I87.1 Stricture of vein  N18.6 ESRD T82.858A Stricture of access  Procedure Coding:  [phone] Cannulation and angiogram of fistula, venous angioplasty (venous anastomosis) X9147 Contrast  Recommendations:  1.  Resume cannulation of graft at next hemodialysis.  2. Refer back for problems with cannulation difficulties- may need retrograde cannulation and dedicated arteriogram to assess inflow. 3. Remove the suture next treatment.  4.  Refer for removal of tunneled hemodialysis catheter if graft cannulation successful for at least 3 treatments without infiltrations.  Discharge: The patient was discharged home in stable condition. The patient was given education regarding the care of the dialysis access AVG and specific instructions in case of any problems.

## 2023-11-20 ENCOUNTER — Ambulatory Visit: Payer: Self-pay

## 2023-11-20 DIAGNOSIS — G35 Multiple sclerosis: Secondary | ICD-10-CM

## 2023-11-20 DIAGNOSIS — Z789 Other specified health status: Secondary | ICD-10-CM

## 2023-11-20 NOTE — Patient Instructions (Signed)
 Visit Information  Thank you for taking time to visit with me today. Please don't hesitate to contact me if I can be of assistance to you.   Following are the goals we discussed today:   Goals Addressed             This Visit's Progress    RN CC follow up activities: further follow up call       Care Coordination Interventions: Reviewed and discussed with patient his recent inpatient admission to [facility], admit date:11/19/23  11/19/23 at 1145 am; dx: AV shut Intervention Review of patient status, including review of consultant's reports, relevant laboratory and other test results, and medications completed You came to the hospital for arm swelling and pain and you were diagnosed with an infiltration of the fistula and anemia.  We treated you with a blood transfusion.  *For your AVF (fistula)  -You will need to have an ultraso1.  Resume cannulation of graft at next hemodialysis.  2. Refer back for problems with cannulation difficulties- may need retrograde cannulation and dedicated arteriogram to assess inflow. 3. Remove the suture next treatment.  4.  Refer for removal of tunneled hemodialysis catheter if graft cannulation successful for at least 3 treatments without infiltrations.    Educated patient on the importance of keeping all scheduled dialysis appointments as scheduled  Discussed plans with patient for ongoing care coordination follow up and provided patient with direct contact information for nurse care coordinator      COMPLETED: To complete brain and spinal MRI       Care Coordination Interventions: Evaluation of current treatment plan related to Multiple Sclerosis  and patient's adherence to plan as established by provider Discussed and reviewed with patient he completed his thoracic/cervical MRI on 10/26/23 as directed, reviewed next scheduled upcoming brain MRI scheduled for 10/31/23 @10 :20 AM Reviewed and discussed with patient his next scheduled PCP post  ED follow up scheduled for 10/31/23 @09 :15 AM Discussed with patient, possible scheduling conflict due to proximity in appointment times Counseled patient on the importance of keeping all scheduled appointments Discussed and reviewed with patient MD findings/recommendations per [name] Please call patient MRI of cervical and thoracic spine showed evidence of chronic demyelinating changes  Please give him a follow-up appointment after that to my next available to discuss treatment options IMPRESSION: MRI scan of cervical spine with and without contrast showing prominent spondylitic change at C6/7 resulting in mild canal and severe left and moderate right-sided foraminal narrowing.  C4-5 also shows spondylitic change with mild canal and bilateral foraminal narrowing.  Ill-defined spinal cord hyperintensities posteriorly and to the right at C5 and at T1-T2 represent remote age demyelinating plaques.  No enhancing lesions are noted on postcontrast images. IMPRESSION: MRI scan of thoracic spine with and without contrast showing ill-defined spinal cord hyperintensity at T1-T2 which may represent remote age demyelinating plaque.  No enhancing lesions are noted.  There is no frank cord compression significant root or foraminal encroachment. Reviewed and discussed patient's next scheduled Neurology appointment with [name] scheduled for 11/11/23 @3 :30 PM        Our next appointment is by telephone on 12/20/23 at 10 am      Please call the care guide team at [phone] if you need to cancel or reschedule your appointment.   If you are experiencing a Mental Health or Behavioral Health Crisis or need someone to talk to, please call 1-800-273-TALK (toll free, 24 hour hotline)  Patient verbalizes  understanding of instructions and care plan provided today and agrees to view in MyChart. Active MyChart status and patient understanding of how to access instructions and care plan via MyChart confirmed with patient.      Juanell Fairly RN, BSN, [facility]  [facility], [facility]  Care Coordinator Phone: [phone]

## 2023-11-20 NOTE — Patient Outreach (Signed)
 Care Coordination   Initial Visit Note   11/20/2023 Name: George Day MRN: [id number] DOB: 1976/02/20  George Day is a 48 y.o. year old male who sees George Macadamia, MD for primary care. I spoke with  George Day by phone today.  What matters to the patients health and wellness today?  George Day is a patient referred to me by George Sale, RN. He is currently in end-stage renal disease and receives dialysis treatment. Recently, he presented to the emergency room due to infiltration and swelling in his left arm. On April 2nd, a procedure was performed to establish an AV shunt for dialysis access.  George Day indicated that he occasionally misses dialysis appointments due to his obligations at home with his children. While he has transportation available on some days, this is not consistently the case. I advised him that he may be eligible to utilize his Medicaid for transportation services and will submit a referral to address this need.  Furthermore, he expressed interest in having a family member serve as his caretaker due to his multiple sclerosis, he experiences fatigue and mobility challenges that complicate his ability to perform daily activities. We reviewed his medication regimen, which he adheres to regularly. I encouraged him to reach out to his healthcare teamat dialysis regarding any concerns related to dialysis and consulting with a nutritionist for dietary guidance there also.    Goals Addressed             This Visit's Progress    RN CC follow up activities: further follow up call       Care Coordination Interventions: Reviewed and discussed with patient his recent inpatient admission to [facility], admit date:11/19/23  11/19/23 at 1145 am; dx: AV shut Intervention Review of patient status, including review of consultant's reports, relevant laboratory and other test results, and medications completed You came to the hospital for arm swelling and  pain and you were diagnosed with an infiltration of the fistula and anemia.  We treated you with a blood transfusion.  *For your AVF (fistula)  -You will need to have an ultraso1.  Resume cannulation of graft at next hemodialysis.  2. Refer back for problems with cannulation difficulties- may need retrograde cannulation and dedicated arteriogram to assess inflow. 3. Remove the suture next treatment.  4.  Refer for removal of tunneled hemodialysis catheter if graft cannulation successful for at least 3 treatments without infiltrations.    Educated patient on the importance of keeping all scheduled dialysis appointments as scheduled  Discussed plans with patient for ongoing care coordination follow up and provided patient with direct contact information for nurse care coordinator                    SDOH assessments and interventions completed:  Yes  SDOH Interventions Today    Flowsheet Row Most Recent Value  SDOH Interventions   Food Insecurity Interventions Intervention Not Indicated  Housing Interventions Intervention Not Indicated  Transportation Interventions Intervention Not Indicated  Utilities Interventions Intervention Not Indicated        Care Coordination Interventions:  Yes, provided   Interventions Today    Flowsheet Row Most Recent Value  Chronic Disease   Chronic disease during today's visit Other  [AV shunt intervention]  General Interventions   General Interventions Discussed/Reviewed General Interventions Discussed, General Interventions Reviewed, Communication with  Communication with Social Work  Research officer, trade union placed]  Education Interventions   Education Provided Provided Education  Pharmacy Interventions  Pharmacy Dicussed/Reviewed Pharmacy Topics Discussed, Pharmacy Topics Reviewed, Medications and their functions  Safety Interventions   Safety Discussed/Reviewed Safety Discussed        Follow up plan: Follow up call scheduled for 12/20/23  10 am     Encounter Outcome:  Patient Visit Completed   Juanell Fairly RN, BSN, [facility]  [facility], [facility]  Care Coordinator Phone: [phone]

## 2023-11-25 ENCOUNTER — Telehealth: Payer: Self-pay | Admitting: *Deleted

## 2023-11-25 NOTE — Progress Notes (Signed)
 Complex Care Management Note Care Guide Note  11/25/2023 Name: Nickson Middlesworth MRN: [id number] DOB: 08-06-1976   Complex Care Management Outreach Attempts: 1st attempt Follow Up Plan:  Additional outreach attempts will be made to offer the patient complex care management information and services.   Encounter Outcome:  No Answer  [facility] Health[facility] Care Guide  Direct Dial:[phone] Fax:[phone] Website: [email or web address]

## 2023-11-25 NOTE — Progress Notes (Signed)
 Complex Care Management Care Guide Note  11/25/2023 Name: George Day MRN: [id number] DOB: February 15, 1976  [name] is a 48 y.o. year old male who is a primary care patient of [name], MD and is actively engaged with the care management team. I reached out to [name] by phone today to assist with scheduling  with the BSW.  Follow up plan: Telephone appointment with complex care management team member scheduled for:  4/15 Routed referral for transportation to care guide pool for outreach.  Gwenevere Ghazi  [facility]  [facility], [facility] Guide  Direct Dial: [phone]  Fax [phone]

## 2023-11-26 ENCOUNTER — Telehealth: Payer: Self-pay | Admitting: *Deleted

## 2023-11-26 NOTE — Progress Notes (Signed)
 Complex Care Management Note Care Guide Note  11/26/2023 Name: Jules Baty MRN: [id number] DOB: 06/20/1976  Noell Shular is a 48 y.o. year old male who is a primary care patient of [name], MD . The community resource team was consulted for assistance with Transportation Needs  and Food Insecurity  SDOH screenings and interventions completed:  Yes     SDOH Interventions Today    Flowsheet Row Most Recent Value  SDOH Interventions   Food Insecurity Interventions Community Resources Provided  [PROVIDED FOOD BANKS AND ALSO [employer] [place] FOOD FINDER]  Transportation Interventions Payor Benefit  [Patient has [facility] medicaid and has transportation through that service]        Care guide performed the following interventions: Patient provided with information about care guide support team and interviewed to confirm resource needs.  Follow Up Plan:  No further follow up planned at this time. The patient has been provided with needed resources.  Encounter Outcome:  Patient Visit Completed Dione Booze  [facility] HealthPopulation Health Care Guide  Direct Dial:[phone] [phone] Website: [email or web address]

## 2023-12-03 ENCOUNTER — Other Ambulatory Visit: Payer: Self-pay | Admitting: Licensed Clinical Social Worker

## 2023-12-03 NOTE — Patient Outreach (Signed)
 Complex Care Management   Visit Note  12/03/2023  Name:  George Day MRN: [id number] DOB: [birth year]  Situation: Referral received for Complex Care Management related to SDOH Barriers:  Transportation PCS services  I obtained verbal consent from Patient.  Visit completed with patient  on the phone  Background:   Past Medical History:  Diagnosis Date   (HFpEF) heart failure with preserved ejection fraction (HCC)    Acute kidney injury superimposed on chronic kidney disease (HCC)    Acute respiratory distress 02/13/2023   Anemia 1998   Anginal pain (HCC)    Anxiety    Asthma    CKD (chronic kidney disease) stage 5, GFR less than 15 ml/min (HCC)    Coronary artery disease    Diabetes mellitus without complication (HCC) 1995   Hypertension 2019   MI (myocardial infarction) (HCC) 2010   Multiple sclerosis (HCC)    Sleep apnea    Stroke [facility]) 2010    Assessment: Patient Reported Symptoms:  Cognitive        Neurological      HEENT        Cardiovascular      Respiratory      Endocrine      Gastrointestinal        Genitourinary      Integumentary      Musculoskeletal       Patient at Risk for Falls Due to: Impaired balance/gait, Impaired mobility, Other (Comment) (Patient has MS)  Psychosocial       Do you feel physically threatened by others?: No      11/05/2023    9:16 AM  Depression screen PHQ 2/9  Decreased Interest 0  Down, Depressed, Hopeless 0  PHQ - 2 Score 0    There were no vitals filed for this visit.  Medications Reviewed Today   Medications were not reviewed in this encounter     Recommendation:   Patient will follow up  with DSS case manager about PCS services through [facility] and [place]n will follow up with [facility] about transportation, if nothing is done by the next visit  the SW will assit with the PCS requewt  Follow Up Plan:   Telephone follow up appointment date/time:  12/17/2023 at 1:00 pm  Jonda Neighbours, PhD [facility], [facility] Social Worker Direct Dial: [phone]  Fax: [phone]

## 2023-12-03 NOTE — Patient Instructions (Signed)
 Visit Information  Thank you for taking time to visit with me today. Please don't hesitate to contact me if I can be of assistance to you before our next scheduled appointment.  Our next appointment is by telephone on 12/17/2023 at 1:00 pm Please call the care guide team at [phone] if you need to cancel or reschedule your appointment.   Following is a copy of your care plan:   Goals Addressed             This Visit's Progress    VBCI Social Work Care Plan       Problems:   Limited access to caregiver and Transportation  CSW Clinical Goal(s):   Over the next 2 weeks the Patient will will follow up with his DSS case worker on his medicaid to see if his wife can be paid to be his caregiver and will check with his dialysis center about transportation and talk to his case manager as directed by Social Work.  Interventions:  SW will assist with the state PCS form and SCAT application if needed after next follow up   Patient Goals/Self-Care Activities:  Coordinate with DSS case worker an dialysis case worker  to assist with PCS services and transportation.  Plan:   Face to Face appointment with care management team member scheduled for: 12/17/2023 at 1:00 pm        Please call the Suicide and Crisis Lifeline: 988 go to [facility] Urgent [facility] [address], [place] [phone]) call 911 if you are experiencing a Mental Health or Behavioral Health Crisis or need someone to talk to.  Patient verbalizes understanding of instructions and care plan provided today and agrees to view in MyChart. Active MyChart status and patient understanding of how to access instructions and care plan via MyChart confirmed with patient.     Jonda Neighbours, PhD [facility], [facility] Social Worker Direct Dial: [phone]  Fax: [phone]

## 2023-12-04 ENCOUNTER — Other Ambulatory Visit: Payer: Self-pay | Admitting: Student

## 2023-12-09 ENCOUNTER — Other Ambulatory Visit: Payer: Self-pay

## 2023-12-09 MED ORDER — HYDRALAZINE HCL 25 MG PO TABS
25.0000 mg | ORAL_TABLET | Freq: Two times a day (BID) | ORAL | 5 refills | Status: DC
Start: 2023-12-09 — End: 2024-01-27
  Filled 2023-12-09: qty 60, 30d supply, fill #0

## 2023-12-11 ENCOUNTER — Other Ambulatory Visit: Payer: Self-pay

## 2023-12-15 ENCOUNTER — Emergency Department (HOSPITAL_COMMUNITY)

## 2023-12-15 ENCOUNTER — Encounter (HOSPITAL_COMMUNITY): Payer: Self-pay

## 2023-12-15 ENCOUNTER — Other Ambulatory Visit: Payer: Self-pay

## 2023-12-15 ENCOUNTER — Emergency Department (HOSPITAL_COMMUNITY)
Admission: EM | Admit: 2023-12-15 | Discharge: 2023-12-15 | Disposition: A | Discharge status: Home / Self Care | Attending: Emergency Medicine | Admitting: Emergency Medicine

## 2023-12-15 DIAGNOSIS — Z7982 Long term (current) use of aspirin: Secondary | ICD-10-CM | POA: Insufficient documentation

## 2023-12-15 DIAGNOSIS — R072 Precordial pain: Secondary | ICD-10-CM | POA: Diagnosis present

## 2023-12-15 DIAGNOSIS — N186 End stage renal disease: Secondary | ICD-10-CM | POA: Diagnosis not present

## 2023-12-15 DIAGNOSIS — Z992 Dependence on renal dialysis: Secondary | ICD-10-CM | POA: Insufficient documentation

## 2023-12-15 DIAGNOSIS — R079 Chest pain, unspecified: Secondary | ICD-10-CM

## 2023-12-15 DIAGNOSIS — I2510 Atherosclerotic heart disease of native coronary artery without angina pectoris: Secondary | ICD-10-CM | POA: Diagnosis not present

## 2023-12-15 LAB — COMPREHENSIVE METABOLIC PANEL WITH GFR
ALT: 11 U/L (ref 0–44)
AST: 18 U/L (ref 15–41)
Albumin: 3.3 g/dL — ABNORMAL LOW (ref 3.5–5.0)
Alkaline Phosphatase: 60 U/L (ref 38–126)
Anion gap: 11 (ref 5–15)
BUN: 33 mg/dL — ABNORMAL HIGH (ref 6–20)
CO2: 23 mmol/L (ref 22–32)
Calcium: 8.8 mg/dL — ABNORMAL LOW (ref 8.9–10.3)
Chloride: 101 mmol/L (ref 98–111)
Creatinine, Ser: 6.54 mg/dL — ABNORMAL HIGH (ref 0.61–1.24)
GFR, Estimated: 10 mL/min — ABNORMAL LOW (ref >60)
Glucose, Bld: 95 mg/dL (ref 70–99)
Potassium: 3.2 mmol/L — ABNORMAL LOW (ref 3.5–5.1)
Sodium: 135 mmol/L (ref 135–145)
Total Bilirubin: 0.9 mg/dL (ref 0.0–1.2)
Total Protein: 6.9 g/dL (ref 6.5–8.1)

## 2023-12-15 LAB — CBC WITH DIFFERENTIAL/PLATELET
Abs Immature Granulocytes: 0.01 10*3/uL (ref 0.00–0.07)
Basophils Absolute: 0 10*3/uL (ref 0.0–0.1)
Basophils Relative: 1 %
Eosinophils Absolute: 0.4 10*3/uL (ref 0.0–0.5)
Eosinophils Relative: 8 %
HCT: 35.3 % — ABNORMAL LOW (ref 39.0–52.0)
Hemoglobin: 11.4 g/dL — ABNORMAL LOW (ref 13.0–17.0)
Immature Granulocytes: 0 %
Lymphocytes Relative: 24 %
Lymphs Abs: 1.4 10*3/uL (ref 0.7–4.0)
MCH: 25.1 pg — ABNORMAL LOW (ref 26.0–34.0)
MCHC: 32.3 g/dL (ref 30.0–36.0)
MCV: 77.8 fL — ABNORMAL LOW (ref 80.0–100.0)
Monocytes Absolute: 0.5 10*3/uL (ref 0.1–1.0)
Monocytes Relative: 9 %
Neutro Abs: 3.4 10*3/uL (ref 1.7–7.7)
Neutrophils Relative %: 58 %
Platelets: 204 10*3/uL (ref 150–400)
RBC: 4.54 MIL/uL (ref 4.22–5.81)
RDW: 15.7 % — ABNORMAL HIGH (ref 11.5–15.5)
WBC: 5.7 10*3/uL (ref 4.0–10.5)
nRBC: 0 % (ref 0.0–0.2)

## 2023-12-15 LAB — TROPONIN I (HIGH SENSITIVITY)
Troponin I (High Sensitivity): 22 ng/L — ABNORMAL HIGH (ref <18)
Troponin I (High Sensitivity): 22 ng/L — ABNORMAL HIGH (ref <18)

## 2023-12-15 MED ORDER — HYDRALAZINE HCL 25 MG PO TABS
25.0000 mg | ORAL_TABLET | Freq: Once | ORAL | Status: AC
  Administered 2023-12-15: 25 mg via ORAL
  Filled 2023-12-15: qty 1

## 2023-12-15 MED ORDER — NITROGLYCERIN 0.4 MG SL SUBL
0.4000 mg | SUBLINGUAL_TABLET | Freq: Once | SUBLINGUAL | Status: AC
  Administered 2023-12-15: 0.4 mg via SUBLINGUAL
  Filled 2023-12-15: qty 1

## 2023-12-15 MED ORDER — AMLODIPINE BESYLATE 5 MG PO TABS
10.0000 mg | ORAL_TABLET | Freq: Once | ORAL | Status: AC
  Administered 2023-12-15: 10 mg via ORAL
  Filled 2023-12-15: qty 2

## 2023-12-15 MED ORDER — CARVEDILOL 12.5 MG PO TABS
50.0000 mg | ORAL_TABLET | Freq: Once | ORAL | Status: AC
  Administered 2023-12-15: 50 mg via ORAL
  Filled 2023-12-15: qty 4

## 2023-12-15 NOTE — ED Triage Notes (Signed)
 Pt to ED via [facility] from home. Pt woke up with mid-sternal chest pain and nausea this am. Pt received ntg x 2, ASA 324mg  by EMS. Pt has dialysis M,W,F, had full tx on Friday.  20g RAC placed by EMS. Pt alert, c/o 10/10 CP.  EMS VS 202/108 86 90% RA, 96% 4L [place] 18

## 2023-12-15 NOTE — Discharge Instructions (Addendum)
 Your workup was reassuring.  Continue take your blood pressure medicines.  Follow-up with your doctors.

## 2023-12-15 NOTE — ED Provider Notes (Signed)
 [place] EMERGENCY DEPARTMENT AT [facility] Provider Note   CSN: [id number] Arrival date & time: 12/15/23  7829     History  Chief Complaint  Patient presents with   Chest Pain    George Day is a 48 y.o. male.   Chest Pain Patient presents with chest pain.  Anterior.  Has some nausea.  Woke with it last night.  History of coronary disease.  Also end-stage renal disease on dialysis.  Today is Sunday.  He was dialyzed on Friday but did less than a full run because he had to leave and get his kids.  Pain is dull in his chest.  States he does not feel just in his chest feels as if it is in his heart.  States he had his medicines last night but has not had them this morning yet.     Home Medications Prior to Admission medications   Medication Sig Start Date End Date Taking? Authorizing Provider  amLODipine  (NORVASC ) 10 MG tablet TAKE 1 TABLET BY MOUTH DAILY 06/25/23  Yes Sheree Dieter, MD  aspirin  EC 81 MG tablet Take 1 tablet (81 mg total) by mouth daily. 11/21/22  Yes Ngetich, Dinah C, NP  atorvastatin  (LIPITOR ) 40 MG tablet TAKE 1 TABLET BY MOUTH DAILY 06/25/23  Yes Sheree Dieter, MD  carvedilol  (COREG ) 25 MG tablet TAKE TWO (2) TABLETS BY MOUTH TWICE DAILY Patient taking differently: Take 25 mg by mouth daily. 06/25/23  Yes Sheree Dieter, MD  DULoxetine  (CYMBALTA ) 30 MG capsule Take 1 capsule (30 mg total) by mouth daily. 07/24/23  Yes Phebe Brasil, MD  EPINEPHrine  0.3 mg/0.3 mL IJ SOAJ injection Inject 0.3 mg into the muscle as needed for anaphylaxis.   Yes [provider]  famotidine  (PEPCID ) 20 MG tablet TAKE 1 TABLET BY MOUTH DAILY 12/05/23  Yes Sheree Dieter, MD  gabapentin  (NEURONTIN ) 100 MG capsule Take 100mg , three times a week only following dialysis. 09/19/23  Yes Sheree Dieter, MD  nitroGLYCERIN  (NITROSTAT ) 0.6 MG SL tablet Place 1 tablet (0.6 mg total) under the tongue every 5 (five) minutes as needed for chest pain. 11/21/22  Yes  Ngetich, Dinah C, NP  olmesartan  (BENICAR ) 20 MG tablet Take 1 tablet (20 mg total) by mouth at bedtime for high blood pressure. 04/04/23  Yes   polyethylene glycol (MIRALAX ) 17 g packet Take 17 g by mouth daily. 11/05/23  Yes Sheree Dieter, MD  sevelamer  carbonate (RENVELA ) 800 MG tablet Take 1 tablet (800 mg total) by mouth 3 (three) times daily with meals. 04/08/23  Yes   TRELEGY ELLIPTA  100-62.5-25 MCG/ACT AEPB INHALE ONE (1) PUFF BY MOUTH DAILY 09/10/23  Yes Sheree Dieter, MD  hydrALAZINE  (APRESOLINE ) 25 MG tablet Take 1 tablet by mouth twice a day Patient not taking: Reported on 12/15/2023 12/09/23         Allergies    Honey bee venom [bee venom], Penicillins, and Tomato    Review of Systems   Review of Systems  Cardiovascular:  Positive for chest pain.    Physical Exam Updated Vital Signs BP (!) 159/88   Pulse 76   Temp 98 F (36.7 C) (Oral)   Resp 18   Ht 5\' 9"  (1.753 m)   Wt 97.5 kg   SpO2 100%   BMI 31.75 kg/m  Physical Exam Vitals and nursing note reviewed.  Cardiovascular:     Rate and Rhythm: Regular rhythm.  Pulmonary:     Breath sounds: No decreased breath sounds.  Chest:  Comments: Dialysis catheter right chest wall Musculoskeletal:     Right lower leg: No edema.     Left lower leg: No edema.  Skin:    General: Skin is warm.     Capillary Refill: Capillary refill takes less than 2 seconds.  Neurological:     Mental Status: He is alert and oriented to person, place, and time.     ED Results / Procedures / Treatments   Labs (all labs ordered are listed, but only abnormal results are displayed) Labs Reviewed  CBC WITH DIFFERENTIAL/PLATELET - Abnormal; Notable for the following components:      Result Value   Hemoglobin 11.4 (*)    HCT 35.3 (*)    MCV 77.8 (*)    MCH 25.1 (*)    RDW 15.7 (*)    All other components within normal limits  COMPREHENSIVE METABOLIC PANEL WITH GFR - Abnormal; Notable for the following components:   Potassium  3.2 (*)    BUN 33 (*)    Creatinine, Ser 6.54 (*)    Calcium  8.8 (*)    Albumin 3.3 (*)    GFR, Estimated 10 (*)    All other components within normal limits  TROPONIN I (HIGH SENSITIVITY) - Abnormal; Notable for the following components:   Troponin I (High Sensitivity) 22 (*)    All other components within normal limits  TROPONIN I (HIGH SENSITIVITY) - Abnormal; Notable for the following components:   Troponin I (High Sensitivity) 22 (*)    All other components within normal limits    EKG EKG Interpretation Date/Time:  Sunday December 15 2023 09:08:46 EDT Ventricular Rate:  76 PR Interval:  188 QRS Duration:  88 QT Interval:  438 QTC Calculation: 493 R Axis:   11  Text Interpretation: Sinus rhythm Left atrial enlargement Borderline T abnormalities, lateral leads Borderline ST elevation, anterior leads Borderline prolonged QT interval No significant change since last tracing Confirmed by Mozell Arias [phone]) on 12/15/2023 9:18:41 AM  Radiology DG Chest Portable 1 View Result Date: 12/15/2023 CLINICAL DATA:  Chest pain EXAM: PORTABLE CHEST 1 VIEW COMPARISON:  10/20/2023 FINDINGS: Perma catheter with tip at the upper cavoatrial junction. There is no edema, consolidation, effusion, or pneumothorax. Normal heart size and mediastinal contours. IMPRESSION: No evidence of active disease. Electronically Signed   By: Ronnette Coke M.D.   On: 12/15/2023 07:48    Procedures Procedures    Medications Ordered in ED Medications  amLODipine  (NORVASC ) tablet 10 mg (10 mg Oral Given 12/15/23 0720)  carvedilol  (COREG ) tablet 50 mg (50 mg Oral Given 12/15/23 0720)  hydrALAZINE  (APRESOLINE ) tablet 25 mg (25 mg Oral Given 12/15/23 0721)  nitroGLYCERIN  (NITROSTAT ) SL tablet 0.4 mg (0.4 mg Sublingual Given 12/15/23 0730)    ED Course/ Medical Decision Making/ A&P                                 Medical Decision Making Amount and/or Complexity of Data Reviewed Labs: ordered. Radiology:  ordered.  Risk Prescription drug management.   Patient with chest pain.  Anterior chest.  Differential diagnosis does include cause such as coronary artery disease.  Is hypertensive.  Has not had his morning medicine.  Reviewed previous cardiology note.  Will get home medicine given.  Will give nitroglycerin .  Will get EKG blood work and chest x-ray.  Patient has had a supple will nitroglycerin .  Blood pressures come down now down to 180/100.  States that his heart pain is feeling somewhat better.  Blood pressure continues to be down.  Feeling better.  Still no pain.  Troponin stable at around 22.  Lower than his baseline.  Doubt cardiac ischemia.  Appears stable for discharge        Final Clinical Impression(s) / ED Diagnoses Final diagnoses:  Nonspecific chest pain  End stage renal disease on dialysis [facility])    Rx / DC Orders ED Discharge Orders     None         Mozell Arias, MD 12/15/23 1510

## 2023-12-15 NOTE — ED Notes (Signed)
 X-ray at bedside

## 2023-12-15 NOTE — ED Notes (Signed)
 Pt given 2 turkey sandwich bags and cranberry juice

## 2023-12-17 ENCOUNTER — Other Ambulatory Visit: Payer: Self-pay | Admitting: Licensed Clinical Social Worker

## 2023-12-17 NOTE — Patient Outreach (Signed)
 Complex Care Management   Visit Note  12/17/2023  Name:  George Day MRN: [id number] DOB: 08-May-1976  Situation: Referral received for Complex Care Management related to  services so that his wife can get paid  I obtained verbal consent from Patient.  Visit completed with patient  on the phone  Background:   Past Medical History:  Diagnosis Date   (HFpEF) heart failure with preserved ejection fraction (HCC)    Acute kidney injury superimposed on chronic kidney disease (HCC)    Acute respiratory distress 02/13/2023   Anemia 1998   Anginal pain (HCC)    Anxiety    Asthma    CKD (chronic kidney disease) stage 5, GFR less than 15 ml/min (HCC)    Coronary artery disease    Diabetes mellitus without complication (HCC) 1995   Hypertension 2019   MI (myocardial infarction) (HCC) 2010   Multiple sclerosis (HCC)    Sleep apnea    Stroke [facility]) 2010    Assessment: patietn stated that the transportation part was all figured out and that he and his wife will share the care. And he also spoke to his Case manager at [facility] and the CM completed the paperwork and it is pending     Recommendation:   Pending PCS paperwork for wife to get paid to be his caregiver  Follow Up Plan:   Telephone follow up appointment date/time:  01/08/2024 at 10:00 am  Jonda Neighbours, PhD [facility], [facility] Social Worker Direct Dial: [phone]  Fax: [phone]

## 2023-12-17 NOTE — Patient Instructions (Signed)
 Visit Information  Thank you for taking time to visit with me today. Please don't hesitate to contact me if I can be of assistance to you before our next scheduled appointment.  Your next care management appointment is by telephone on 01/08/2024 at 10:00 am   Please call the care guide team at [phone] if you need to cancel, schedule, or reschedule an appointment.   Please call the Suicide and Crisis Lifeline: 988 go to [facility] [address] [place] [phone]) call 911 if you are experiencing a Mental Health or Behavioral Health Crisis or need someone to talk to.  Jonda Neighbours, PhD [facility], [facility] Social Worker Direct Dial: [phone]  Fax: [phone]

## 2023-12-18 ENCOUNTER — Other Ambulatory Visit: Payer: Self-pay

## 2023-12-18 MED ORDER — HYDRALAZINE HCL 25 MG PO TABS: 25.0000 mg | ORAL_TABLET | Freq: Two times a day (BID) | ORAL | 5 refills | Status: DC

## 2024-01-06 ENCOUNTER — Other Ambulatory Visit: Payer: Self-pay

## 2024-01-06 ENCOUNTER — Ambulatory Visit (INDEPENDENT_AMBULATORY_CARE_PROVIDER_SITE_OTHER): Payer: 59 | Admitting: Student

## 2024-01-06 ENCOUNTER — Encounter: Payer: Self-pay | Admitting: Student

## 2024-01-06 VITALS — BP 161/97 | HR 67 | Temp 97.5°F | Ht 69.0 in | Wt 211.1 lb

## 2024-01-06 DIAGNOSIS — Z0000 Encounter for general adult medical examination without abnormal findings: Secondary | ICD-10-CM

## 2024-01-06 DIAGNOSIS — I10 Essential (primary) hypertension: Secondary | ICD-10-CM

## 2024-01-06 DIAGNOSIS — E1122 Type 2 diabetes mellitus with diabetic chronic kidney disease: Secondary | ICD-10-CM

## 2024-01-06 DIAGNOSIS — I120 Hypertensive chronic kidney disease with stage 5 chronic kidney disease or end stage renal disease: Secondary | ICD-10-CM

## 2024-01-06 DIAGNOSIS — N186 End stage renal disease: Secondary | ICD-10-CM

## 2024-01-06 DIAGNOSIS — E785 Hyperlipidemia, unspecified: Secondary | ICD-10-CM

## 2024-01-06 DIAGNOSIS — E1169 Type 2 diabetes mellitus with other specified complication: Secondary | ICD-10-CM

## 2024-01-06 DIAGNOSIS — Z992 Dependence on renal dialysis: Secondary | ICD-10-CM

## 2024-01-06 NOTE — Assessment & Plan Note (Signed)
 Current medication includes atorvastatin  40 mg daily.  Endorses good compliance. - Follow-up lipid panel

## 2024-01-06 NOTE — Assessment & Plan Note (Signed)
 Currently receives HD on MWF.  Last received on Friday and denies any concerns.

## 2024-01-06 NOTE — Addendum Note (Signed)
 Addended by: Manfred Seed on: 01/06/2024 09:28 AM   Modules accepted: Orders

## 2024-01-06 NOTE — Progress Notes (Signed)
 Internal Medicine Clinic Attending  Case discussed with the resident at the time of the visit.  We reviewed the resident's history and exam and pertinent patient test results.  I agree with the assessment, diagnosis, and plan of care documented in the resident's note.

## 2024-01-06 NOTE — Assessment & Plan Note (Signed)
 Current medications include amlodipine  10 mg daily, Coreg  25 mg, hydralazine  25 milligrams BID, olmesartan  20 mg at bedtime.  Blood pressure is 176/92 this a.m.  Did not take medications this morning. No blood pressure cuff at home so encouraged to monitor at home. - Continue current medication regimen

## 2024-01-06 NOTE — Patient Instructions (Signed)
 Thank you so much for coming to the clinic today!   I will call back regarding your labs.   If you have any questions please feel free to the call the clinic at anytime at [phone]. It was a pleasure seeing you!  Best, Dr. Carolee Churchman

## 2024-01-06 NOTE — Assessment & Plan Note (Signed)
 Patient notes he currently has his own ophthalmologist and will schedule follow-up visit with them shortly.

## 2024-01-06 NOTE — Progress Notes (Signed)
 CC: Chronic condition follow-up  HPI: [name] is a 48 y.o. male living with a history stated below and presents today for chronic condition follow-up. Please see problem based assessment and plan for additional details.  Past Medical History:  Diagnosis Date   (HFpEF) heart failure with preserved ejection fraction (HCC)    Acute kidney injury superimposed on chronic kidney disease (HCC)    Acute respiratory distress 02/13/2023   Anemia 1998   Anginal pain (HCC)    Anxiety    Asthma    CKD (chronic kidney disease) stage 5, GFR less than 15 ml/min (HCC)    Coronary artery disease    Diabetes mellitus without complication (HCC) 1995   Hypertension 2019   MI (myocardial infarction) (HCC) 2010   Multiple sclerosis (HCC)    Sleep apnea    Stroke (HCC) 2010    Current Outpatient Medications on File Prior to Visit  Medication Sig Dispense Refill   amLODipine  (NORVASC ) 10 MG tablet TAKE 1 TABLET BY MOUTH DAILY 30 tablet 10   aspirin  EC 81 MG tablet Take 1 tablet (81 mg total) by mouth daily. 90 tablet 1   atorvastatin  (LIPITOR ) 40 MG tablet TAKE 1 TABLET BY MOUTH DAILY 30 tablet 10   carvedilol  (COREG ) 25 MG tablet TAKE TWO (2) TABLETS BY MOUTH TWICE DAILY (Patient taking differently: Take 25 mg by mouth daily.) 120 tablet 10   DULoxetine  (CYMBALTA ) 30 MG capsule Take 1 capsule (30 mg total) by mouth daily. 30 capsule 11   EPINEPHrine  0.3 mg/0.3 mL IJ SOAJ injection Inject 0.3 mg into the muscle as needed for anaphylaxis.     famotidine  (PEPCID ) 20 MG tablet TAKE 1 TABLET BY MOUTH DAILY 90 tablet 11   gabapentin  (NEURONTIN ) 100 MG capsule Take 100mg , three times a week only following dialysis. 30 capsule 3   hydrALAZINE  (APRESOLINE ) 25 MG tablet Take 1 tablet by mouth twice a day (Patient not taking: Reported on 12/15/2023) 60 tablet 5   hydrALAZINE  (APRESOLINE ) 25 MG tablet Take 1 tablet by mouth twice a day 60 tablet 5   nitroGLYCERIN  (NITROSTAT ) 0.6 MG SL tablet Place 1  tablet (0.6 mg total) under the tongue every 5 (five) minutes as needed for chest pain. 100 tablet 5   olmesartan  (BENICAR ) 20 MG tablet Take 1 tablet (20 mg total) by mouth at bedtime for high blood pressure. 30 tablet 11   polyethylene glycol (MIRALAX ) 17 g packet Take 17 g by mouth daily. 100 each 3   sevelamer  carbonate (RENVELA ) 800 MG tablet Take 1 tablet (800 mg total) by mouth 3 (three) times daily with meals. 270 tablet 3   TRELEGY ELLIPTA  100-62.5-25 MCG/ACT AEPB INHALE ONE (1) PUFF BY MOUTH DAILY 60 each 10   No current facility-administered medications on file prior to visit.    Family History  Problem Relation Age of Onset   Diabetes Mother    Hyperlipidemia Mother    Kidney disease Mother    Drug abuse Father    Diabetes Sister    Hyperlipidemia Sister    Hearing loss Sister    Diabetes Brother    Hyperlipidemia Brother     Social History   Socioeconomic History   Marital status: Married    Spouse name: Not on file   Number of children: 4   Years of education: Not on file   Highest education level: 10th grade  Occupational History   Occupation: Hydrologist  Tobacco Use   Smoking status:  Former    Current packs/day: 0.00    Average packs/day: 0.5 packs/day for 31.0 years (15.5 ttl pk-yrs)    Types: Cigars, E-cigarettes, Cigarettes    Start date: 09/1990    Quit date: 09/2021    Years since quitting: 2.2   Smokeless tobacco: Never  Vaping Use   Vaping status: Never Used  Substance and Sexual Activity   Alcohol use: Not Currently    Comment: occasioanlly    Drug use: Not Currently    Types: Marijuana    Comment: 2 x week   Sexual activity: Yes    Birth control/protection: None  Other Topics Concern   Not on file  Social History Narrative   Not on file   Social Drivers of Health   Financial Resource Strain: Medium Risk (09/20/2022)   Overall Financial Resource Strain (CARDIA)    Difficulty of Paying Living Expenses: Somewhat hard  Food  Insecurity: No Food Insecurity (12/03/2023)   Hunger Vital Sign    Worried About Running Out of Food in the Last Year: Never true    Ran Out of Food in the Last Year: Never true  Recent Concern: Food Insecurity - Food Insecurity Present (10/20/2023)   Hunger Vital Sign    Worried About Running Out of Food in the Last Year: Often true    Ran Out of Food in the Last Year: Sometimes true  Transportation Needs: Unmet Transportation Needs (12/03/2023)   PRAPARE - Administrator, Civil Service (Medical): Yes    Lack of Transportation (Non-Medical): Yes  Physical Activity: Not on file  Stress: Not on file  Social Connections: Moderately Integrated (10/20/2023)   Social Connection and Isolation Panel [NHANES]    Frequency of Communication with Friends and Family: More than three times a week    Frequency of Social Gatherings with Friends and Family: Three times a week    Attends Religious Services: Never    Active Member of Clubs or Organizations: No    Attends Banker Meetings: 1 to 4 times per year    Marital Status: Married  Catering manager Violence: Not At Risk (12/03/2023)   Humiliation, Afraid, Rape, and Kick questionnaire    Fear of Current or Ex-Partner: No    Emotionally Abused: No    Physically Abused: No    Sexually Abused: No    Review of Systems: ROS negative except for what is noted on the assessment and plan.  Vitals:   01/06/24 0844 01/06/24 0848  BP: (!) 176/92 (!) 161/97  Pulse: 66 67  Temp: (!) 97.5 F (36.4 C)   TempSrc: Oral   SpO2: 100%   Weight: 211 lb 1.6 oz (95.8 kg)   Height: 5\' 9"  (1.753 m)     Physical Exam: Constitutional: well-appearing in no acute distress HENT: normocephalic atraumatic, mucous membranes moist Eyes: conjunctiva non-erythematous Neck: supple Cardiovascular: regular rate and rhythm, no m/r/g Pulmonary/Chest: normal work of breathing on room air, lungs clear to auscultation bilaterally Abdominal: soft,  non-tender, non-distended MSK: normal bulk and tone Neurological: alert & oriented x 3, 5/5 strength in bilateral upper and lower extremities, normal gait Skin: warm and dry Assessment & Plan:   Essential hypertension Current medications include amlodipine  10 mg daily, Coreg  25 mg, hydralazine  25 milligrams BID, olmesartan  20 mg at bedtime.  Blood pressure is 176/92 this a.m.  Did not take medications this morning. No blood pressure cuff at home so encouraged to monitor at home. - Continue current medication regimen  ESRD on dialysis [facility]) Currently receives HD on MWF.  Last received on Friday and denies any concerns.  Hyperlipidemia associated with type 2 diabetes mellitus (HCC) Current medication includes atorvastatin  40 mg daily.  Endorses good compliance. - Follow-up lipid panel  Healthcare maintenance Patient notes he currently has his own ophthalmologist and will schedule follow-up visit with them shortly.  Patient discussed with Dr. Veleta Gerold, MD  [facility] Internal Medicine, PGY-1 Date 01/06/2024 Time 9:12 AM

## 2024-01-07 ENCOUNTER — Ambulatory Visit: Admitting: Cardiovascular Disease

## 2024-01-07 LAB — LIPID PANEL
Chol/HDL Ratio: 3.4 ratio (ref 0.0–5.0)
Cholesterol, Total: 148 mg/dL (ref 100–199)
HDL: 44 mg/dL (ref >39)
LDL Chol Calc (NIH): 84 mg/dL (ref 0–99)
Triglycerides: 111 mg/dL (ref 0–149)
VLDL Cholesterol Cal: 20 mg/dL (ref 5–40)

## 2024-01-08 ENCOUNTER — Ambulatory Visit: Payer: Self-pay | Admitting: Student

## 2024-01-08 ENCOUNTER — Other Ambulatory Visit: Payer: Self-pay | Admitting: Licensed Clinical Social Worker

## 2024-01-08 NOTE — Patient Outreach (Signed)
 Complex Care Management   Visit Note  01/08/2024  Name:  George Day MRN: [id number] DOB: 05-Dec-1975  Situation: Referral received for Complex Care Management related to [facility] Services I obtained verbal consent from Patient.  Visit completed with patient  on the phone  Background:   Past Medical History:  Diagnosis Date   (HFpEF) heart failure with preserved ejection fraction (HCC)    Acute kidney injury superimposed on chronic kidney disease (HCC)    Acute respiratory distress 02/13/2023   Anemia 1998   Anginal pain (HCC)    Anxiety    Asthma    CKD (chronic kidney disease) stage 5, GFR less than 15 ml/min (HCC)    Coronary artery disease    Diabetes mellitus without complication (HCC) 1995   Hypertension 2019   MI (myocardial infarction) (HCC) 2010   Multiple sclerosis (HCC)    Sleep apnea    Stroke [facility]) 2010    Assessment: Patient stated that he is working with the CM at [facility] in regards to getting PCS covered by his Medicaid. The patient went to [facility] to meet with the CM and the CM stated that she would submit the paperwork and its pending. The patient is going to follow up with the CM. Patient stated that he does not need any follow up form [facility] SW and if he needs here he will call or let his PCP know that he needs to be scheduled with t he [facility]BCI SW    Recommendation:   None  Follow Up Plan:   Closing From:  Complex Care Management  Jonda Neighbours, PhD [facility], [facility] Social Worker Direct Dial: [phone]  Fax: [phone]

## 2024-01-08 NOTE — Patient Instructions (Signed)

## 2024-01-15 NOTE — Telephone Encounter (Signed)
 Erroneous encounter

## 2024-01-27 ENCOUNTER — Other Ambulatory Visit: Payer: Self-pay

## 2024-01-27 MED ORDER — HYDRALAZINE HCL 50 MG PO TABS
50.0000 mg | ORAL_TABLET | Freq: Two times a day (BID) | ORAL | 5 refills | Status: AC
  Filled 2024-01-27: qty 60, 30d supply, fill #0

## 2024-01-28 ENCOUNTER — Other Ambulatory Visit: Payer: Self-pay

## 2024-01-31 ENCOUNTER — Ambulatory Visit: Admitting: Podiatry

## 2024-02-03 ENCOUNTER — Encounter: Payer: Self-pay | Admitting: *Deleted

## 2024-02-05 ENCOUNTER — Ambulatory Visit: Attending: Cardiology | Admitting: Cardiovascular Disease

## 2024-02-05 ENCOUNTER — Encounter: Payer: Self-pay | Admitting: Cardiovascular Disease

## 2024-02-05 VITALS — BP 229/134 | HR 96 | Ht 69.0 in

## 2024-02-05 DIAGNOSIS — E782 Mixed hyperlipidemia: Secondary | ICD-10-CM | POA: Diagnosis not present

## 2024-02-05 DIAGNOSIS — I10 Essential (primary) hypertension: Secondary | ICD-10-CM | POA: Diagnosis not present

## 2024-02-05 DIAGNOSIS — I3139 Other pericardial effusion (noninflammatory): Secondary | ICD-10-CM | POA: Diagnosis not present

## 2024-02-05 DIAGNOSIS — I2510 Atherosclerotic heart disease of native coronary artery without angina pectoris: Secondary | ICD-10-CM | POA: Diagnosis not present

## 2024-02-05 NOTE — Progress Notes (Signed)
 02/05/2024 George Day   May 28, 1976  [id number]  Primary Physician Sheree Dieter, MD Primary Cardiologist: Avanell Leigh MD [name]  HPI:  George Day is a 48 y.o.  mildly overweight married African-American American male father of 4 daughters who relocated from [place] to [place] .   I initially saw him during his hospitalization on 11/15/2020.  I last saw him in the office 02/19/2023.  Since that time he moved from [place]  and has returned back to [place] .  He was put on hemodialysis last year.  He has a history of difficult to control hypertension, diet-controlled diabetes and hyperlipidemia as well as moderate renal insufficiency.  He had renal Doppler studies that did not show evidence of renal artery stenosis, 2D echo that showed low normal EF with moderate concentric LVH and Myoview  stress test that was low risk as well without significant ischemia.     He was hospitalized with volume overload secondary to not taking his diuretics for a week and a half.  Does have stage IV CKD with serum creatinine in the 5 range.  His echo revealed normal LV systolic function, grade 3 diastolic dysfunction with a moderate size circumferential pericardial effusion that did not have tamponade physiology.  He was diuresed over 6 L and was discharged home on the 28th.  Since I saw him a year ago he did move to [place]  and returned to [place] .  He has since been on hemodialysis 3 times a week.  He denies chest pain or shortness of breath.  He does have obstructive sleep apnea on CPAP.  Current Meds  Medication Sig   amLODipine  (NORVASC ) 10 MG tablet TAKE 1 TABLET BY MOUTH DAILY   aspirin  EC 81 MG tablet Take 1 tablet (81 mg total) by mouth daily.   atorvastatin  (LIPITOR ) 40 MG tablet TAKE 1 TABLET BY MOUTH DAILY   carvedilol  (COREG ) 25 MG tablet TAKE TWO (2) TABLETS BY MOUTH TWICE DAILY (Patient taking differently: Take 25 mg by mouth  daily.)   DULoxetine  (CYMBALTA ) 30 MG capsule Take 1 capsule (30 mg total) by mouth daily.   EPINEPHrine  0.3 mg/0.3 mL IJ SOAJ injection Inject 0.3 mg into the muscle as needed for anaphylaxis.   famotidine  (PEPCID ) 20 MG tablet TAKE 1 TABLET BY MOUTH DAILY   gabapentin  (NEURONTIN ) 100 MG capsule Take 100mg , three times a week only following dialysis.   hydrALAZINE  (APRESOLINE ) 50 MG tablet Take 1 tablet (50 mg total) by mouth 2 (two) times daily.   nitroGLYCERIN  (NITROSTAT ) 0.6 MG SL tablet Place 1 tablet (0.6 mg total) under the tongue every 5 (five) minutes as needed for chest pain.   olmesartan  (BENICAR ) 20 MG tablet Take 1 tablet (20 mg total) by mouth at bedtime for high blood pressure.   polyethylene glycol (MIRALAX ) 17 g packet Take 17 g by mouth daily.   sevelamer  carbonate (RENVELA ) 800 MG tablet Take 1 tablet (800 mg total) by mouth 3 (three) times daily with meals.   TRELEGY ELLIPTA  100-62.5-25 MCG/ACT AEPB INHALE ONE (1) PUFF BY MOUTH DAILY     Allergies  Allergen Reactions   Honey Bee Venom [Bee Venom] Anaphylaxis   Penicillins Anaphylaxis    ALL   Tomato Swelling    Social History   Socioeconomic History   Marital status: Married    Spouse name: Not on file   Number of children: 4   Years of education: Not on file  Highest education level: 10th grade  Occupational History   Occupation: Hydrologist  Tobacco Use   Smoking status: Former    Current packs/day: 0.00    Average packs/day: 0.5 packs/day for 31.0 years (15.5 ttl pk-yrs)    Types: Cigars, E-cigarettes, Cigarettes    Start date: 09/1990    Quit date: 09/2021    Years since quitting: 2.3   Smokeless tobacco: Never  Vaping Use   Vaping status: Never Used  Substance and Sexual Activity   Alcohol use: Not Currently    Comment: occasioanlly    Drug use: Not Currently    Types: Marijuana    Comment: 2 x week   Sexual activity: Yes    Birth control/protection: None  Other Topics Concern    Not on file  Social History Narrative   Not on file   Social Drivers of Health   Financial Resource Strain: Low Risk  (01/08/2024)   Overall Financial Resource Strain (CARDIA)    Difficulty of Paying Living Expenses: Not very hard  Food Insecurity: No Food Insecurity (01/08/2024)   Hunger Vital Sign    Worried About Running Out of Food in the Last Year: Never true    Ran Out of Food in the Last Year: Never true  Recent Concern: Food Insecurity - Food Insecurity Present (10/20/2023)   Hunger Vital Sign    Worried About Running Out of Food in the Last Year: Often true    Ran Out of Food in the Last Year: Sometimes true  Transportation Needs: No Transportation Needs (01/08/2024)   PRAPARE - Administrator, Civil Service (Medical): No    Lack of Transportation (Non-Medical): No  Recent Concern: Transportation Needs - Unmet Transportation Needs (12/03/2023)   PRAPARE - Administrator, Civil Service (Medical): Yes    Lack of Transportation (Non-Medical): Yes  Physical Activity: Not on file  Stress: Not on file  Social Connections: Moderately Integrated (10/20/2023)   Social Connection and Isolation Panel    Frequency of Communication with Friends and Family: More than three times a week    Frequency of Social Gatherings with Friends and Family: Three times a week    Attends Religious Services: Never    Active Member of Clubs or Organizations: No    Attends Banker Meetings: 1 to 4 times per year    Marital Status: Married  Catering manager Violence: Not At Risk (01/08/2024)   Humiliation, Afraid, Rape, and Kick questionnaire    Fear of Current or Ex-Partner: No    Emotionally Abused: No    Physically Abused: No    Sexually Abused: No     Review of Systems: General: negative for chills, fever, night sweats or weight changes.  Cardiovascular: negative for chest pain, dyspnea on exertion, edema, orthopnea, palpitations, paroxysmal nocturnal dyspnea or  shortness of breath Dermatological: negative for rash Respiratory: negative for cough or wheezing Urologic: negative for hematuria Abdominal: negative for nausea, vomiting, diarrhea, bright red blood per rectum, melena, or hematemesis Neurologic: negative for visual changes, syncope, or dizziness All other systems reviewed and are otherwise negative except as noted above.    Blood pressure (!) 229/134, pulse 96, height 5' 9 (1.753 m), SpO2 98%.  General appearance: alert and no distress Neck: no adenopathy, no carotid bruit, no JVD, supple, symmetrical, trachea midline, and thyroid not enlarged, symmetric, no tenderness/mass/nodules Lungs: clear to auscultation bilaterally Heart: regular rate and rhythm, S1, S2 normal, no murmur, click, rub  or gallop Extremities: extremities normal, atraumatic, no cyanosis or edema Pulses: 2+ and symmetric Skin: Skin color, texture, turgor normal. No rashes or lesions Neurologic: Grossly normal  EKG not performed today      ASSESSMENT AND PLAN:   Accelerated hypertension History of accelerated hypertension blood pressure measured today at 229/134.  He did not take his blood pressure medicines this morning which include amlodipine , carvedilol , hydralazine  and Benicar .  Hyperlipidemia History of hyperlipidemia on statin therapy lipid profile performed 01/06/2024 revealed a total cholesterol of 40, LDL 84 and HDL 44.  Pericardial effusion History of moderate-sized pericardial effusion by 2D echo performed 02/14/2023.  Will repeat a 2D echo to see if this has resolved after beginning dialysis.  Chronic diastolic (congestive) heart failure (HCC) Grade 3 diastolic dysfunction on 2D echo 02/14/2023 on dialysis.  He appears euvolemic on exam.     Avanell Leigh MD [facility], [facility] 02/05/2024 11:16 AM

## 2024-02-05 NOTE — Assessment & Plan Note (Signed)
 History of moderate-sized pericardial effusion by 2D echo performed 02/14/2023.  Will repeat a 2D echo to see if this has resolved after beginning dialysis.

## 2024-02-05 NOTE — Assessment & Plan Note (Signed)
 History of accelerated hypertension blood pressure measured today at 229/134.  He did not take his blood pressure medicines this morning which include amlodipine , carvedilol , hydralazine  and Benicar .

## 2024-02-05 NOTE — Patient Instructions (Signed)
 Medication Instructions:  Your physician recommends that you continue on your current medications as directed. Please refer to the Current Medication list given to you today.  *If you need a refill on your cardiac medications before your next appointment, please call your pharmacy*  Testing/Procedures: Your physician has requested that you have an echocardiogram. Echocardiography is a painless test that uses sound waves to create images of your heart. It provides your doctor with information about the size and shape of your heart and how well your heart's chambers and valves are working. This procedure takes approximately one hour. There are no restrictions for this procedure. Please do NOT wear cologne, perfume, aftershave, or lotions (deodorant is allowed). Please arrive 15 minutes prior to your appointment time.  Please note: We ask at that you not bring children with you during ultrasound (echo/ vascular) testing. Due to room size and safety concerns, children are not allowed in the ultrasound rooms during exams. Our front office staff cannot provide observation of children in our lobby area while testing is being conducted. An adult accompanying a patient to their appointment will only be allowed in the ultrasound room at the discretion of the ultrasound technician under special circumstances. We apologize for any inconvenience.   Follow-Up: At [facility], you and your health needs are our priority.  As part of our continuing mission to provide you with exceptional heart care, our providers are all part of one team.  This team includes your primary Cardiologist (physician) and Advanced Practice Providers or APPs (Physician Assistants and Nurse Practitioners) who all work together to provide you with the care you need, when you need it.  Your next appointment:   6 month(s)  Provider:   Marcie Sever, PA-C, Callie Goodrich, PA-C, Kathleen Johnson, PA-C, Hao Meng, PA-C, Marlana Silvan,  NP, or Katlyn West, NP         Then, Lauro Portal, MD will plan to see you again in 12 month(s).     We recommend signing up for the patient portal called MyChart.  Sign up information is provided on this After Visit Summary.  MyChart is used to connect with patients for Virtual Visits (Telemedicine).  Patients are able to view lab/test results, encounter notes, upcoming appointments, etc.  Non-urgent messages can be sent to your provider as well.   To learn more about what you can do with MyChart, go to [email or web address].   Other Instructions Dr. Katheryne Pane has requested that you schedule an appointment with one of our clinical pharmacists for a blood pressure check appointment within the next 6-8 weeks.  If you monitor your blood pressure (BP) at home, please bring your BP cuff and your BP readings with you to this appointment  HOW TO TAKE YOUR BLOOD PRESSURE: Rest 5 minutes before taking your blood pressure. Don't smoke or drink caffeinated beverages for at least 30 minutes before. Take your blood pressure before (not after) you eat. Sit comfortably with your back supported and both feet on the floor (don't cross your legs). Elevate your arm to heart level on a table or a desk. Use the proper sized cuff. It should fit smoothly and snugly around your bare upper arm. There should be enough room to slip a fingertip under the cuff. The bottom edge of the cuff should be 1 inch above the crease of the elbow. Ideally, take 3 measurements at one sitting and record the average.

## 2024-02-05 NOTE — Assessment & Plan Note (Signed)
 History of hyperlipidemia on statin therapy lipid profile performed 01/06/2024 revealed a total cholesterol of 40, LDL 84 and HDL 44.

## 2024-02-05 NOTE — Assessment & Plan Note (Signed)
 Grade 3 diastolic dysfunction on 2D echo 02/14/2023 on dialysis.  He appears euvolemic on exam.

## 2024-02-06 ENCOUNTER — Encounter (HOSPITAL_COMMUNITY): Payer: Self-pay | Admitting: Emergency Medicine

## 2024-02-06 ENCOUNTER — Emergency Department (HOSPITAL_COMMUNITY)

## 2024-02-06 ENCOUNTER — Other Ambulatory Visit: Payer: Self-pay

## 2024-02-06 ENCOUNTER — Emergency Department (HOSPITAL_COMMUNITY)
Admission: EM | Admit: 2024-02-06 | Discharge: 2024-02-06 | Disposition: A | Discharge status: Home / Self Care | Attending: Emergency Medicine | Admitting: Emergency Medicine

## 2024-02-06 ENCOUNTER — Telehealth: Payer: Self-pay

## 2024-02-06 DIAGNOSIS — E1122 Type 2 diabetes mellitus with diabetic chronic kidney disease: Secondary | ICD-10-CM | POA: Insufficient documentation

## 2024-02-06 DIAGNOSIS — N186 End stage renal disease: Secondary | ICD-10-CM | POA: Diagnosis not present

## 2024-02-06 DIAGNOSIS — I132 Hypertensive heart and chronic kidney disease with heart failure and with stage 5 chronic kidney disease, or end stage renal disease: Secondary | ICD-10-CM | POA: Diagnosis not present

## 2024-02-06 DIAGNOSIS — R1013 Epigastric pain: Secondary | ICD-10-CM | POA: Diagnosis present

## 2024-02-06 DIAGNOSIS — I5020 Unspecified systolic (congestive) heart failure: Secondary | ICD-10-CM | POA: Diagnosis not present

## 2024-02-06 DIAGNOSIS — R5383 Other fatigue: Secondary | ICD-10-CM

## 2024-02-06 DIAGNOSIS — Z992 Dependence on renal dialysis: Secondary | ICD-10-CM | POA: Insufficient documentation

## 2024-02-06 LAB — CBC WITH DIFFERENTIAL/PLATELET
Abs Immature Granulocytes: 0.01 10*3/uL (ref 0.00–0.07)
Basophils Absolute: 0 10*3/uL (ref 0.0–0.1)
Basophils Relative: 1 %
Eosinophils Absolute: 0.2 10*3/uL (ref 0.0–0.5)
Eosinophils Relative: 4 %
HCT: 26.1 % — ABNORMAL LOW (ref 39.0–52.0)
Hemoglobin: 8.7 g/dL — ABNORMAL LOW (ref 13.0–17.0)
Immature Granulocytes: 0 %
Lymphocytes Relative: 18 %
Lymphs Abs: 1.1 10*3/uL (ref 0.7–4.0)
MCH: 24.6 pg — ABNORMAL LOW (ref 26.0–34.0)
MCHC: 33.3 g/dL (ref 30.0–36.0)
MCV: 73.7 fL — ABNORMAL LOW (ref 80.0–100.0)
Monocytes Absolute: 0.5 10*3/uL (ref 0.1–1.0)
Monocytes Relative: 9 %
Neutro Abs: 4 10*3/uL (ref 1.7–7.7)
Neutrophils Relative %: 68 %
Platelets: 199 10*3/uL (ref 150–400)
RBC: 3.54 MIL/uL — ABNORMAL LOW (ref 4.22–5.81)
RDW: 16.7 % — ABNORMAL HIGH (ref 11.5–15.5)
WBC: 5.8 10*3/uL (ref 4.0–10.5)
nRBC: 0 % (ref 0.0–0.2)

## 2024-02-06 LAB — LIPASE, BLOOD: Lipase: 49 U/L (ref 11–51)

## 2024-02-06 LAB — COMPREHENSIVE METABOLIC PANEL WITH GFR
ALT: 32 U/L (ref 0–44)
AST: 26 U/L (ref 15–41)
Albumin: 3.1 g/dL — ABNORMAL LOW (ref 3.5–5.0)
Alkaline Phosphatase: 64 U/L (ref 38–126)
Anion gap: 10 (ref 5–15)
BUN: 52 mg/dL — ABNORMAL HIGH (ref 6–20)
CO2: 21 mmol/L — ABNORMAL LOW (ref 22–32)
Calcium: 8.4 mg/dL — ABNORMAL LOW (ref 8.9–10.3)
Chloride: 106 mmol/L (ref 98–111)
Creatinine, Ser: 6.83 mg/dL — ABNORMAL HIGH (ref 0.61–1.24)
GFR, Estimated: 9 mL/min — ABNORMAL LOW (ref >60)
Glucose, Bld: 128 mg/dL — ABNORMAL HIGH (ref 70–99)
Potassium: 3.6 mmol/L (ref 3.5–5.1)
Sodium: 137 mmol/L (ref 135–145)
Total Bilirubin: 1.5 mg/dL — ABNORMAL HIGH (ref 0.0–1.2)
Total Protein: 6 g/dL — ABNORMAL LOW (ref 6.5–8.1)

## 2024-02-06 LAB — TROPONIN I (HIGH SENSITIVITY): Troponin I (High Sensitivity): 29 ng/L — ABNORMAL HIGH (ref <18)

## 2024-02-06 LAB — PHOSPHORUS: Phosphorus: 3.4 mg/dL (ref 2.5–4.6)

## 2024-02-06 LAB — MAGNESIUM: Magnesium: 2.2 mg/dL (ref 1.7–2.4)

## 2024-02-06 NOTE — ED Provider Notes (Signed)
 [place] [facility] Provider Note   CSN: [id number] Arrival date & time: 02/06/24  2033     History Chief Complaint  Patient presents with   Abdominal Pain    George Day is a 48 y.o. male w/ PMHx HFpEF, hypertension, GI bleed, diabetes, ESRD on dialysis Monday Wednesday Friday, obesity, anxiety, hyperlipidemia who presents to the ED for evaluation of multiple complaints.  Patient states he has felt very tired throughout the day.  He states he lay down on the floor because he was so tired and experienced a burning all over his body.  Patient reports nausea without vomiting.  Patient reports intermittent shortness of breath without chest pain.  Patient unsure if he had a fever.  Patient denies any sick contacts.  Patient intermittently complains of abdominal pain       Physical Exam Updated Vital Signs BP (!) 181/104   Pulse 87   Temp 98.2 F (36.8 C) (Rectal)   Resp 18   Ht 5' 9 (1.753 m)   Wt 95.8 kg   SpO2 100%   BMI 31.19 kg/m  Physical Exam Vitals and nursing note reviewed.  Constitutional:      General: He is not in acute distress.    Appearance: He is well-developed. He is obese. He is not ill-appearing, toxic-appearing or diaphoretic.  HENT:     Head: Normocephalic and atraumatic.   Eyes:     Conjunctiva/sclera: Conjunctivae normal.    Cardiovascular:     Rate and Rhythm: Normal rate and regular rhythm.     Heart sounds: Normal heart sounds. No murmur heard. Pulmonary:     Effort: Pulmonary effort is normal. No respiratory distress.     Breath sounds: Normal breath sounds. No rales.  Abdominal:     Palpations: Abdomen is soft.     Tenderness: There is no abdominal tenderness. There is no guarding or rebound. Negative signs include Murphy's sign.   Musculoskeletal:        General: No swelling.     Cervical back: Neck supple.   Skin:    General: Skin is warm and dry.     Capillary Refill: Capillary refill  takes less than 2 seconds.   Neurological:     Mental Status: He is alert.   Psychiatric:        Mood and Affect: Mood normal.     ED Results / Procedures / Treatments   Labs (all labs ordered are listed, but only abnormal results are displayed) Labs Reviewed  CBC WITH DIFFERENTIAL/PLATELET - Abnormal; Notable for the following components:      Result Value   RBC 3.54 (*)    Hemoglobin 8.7 (*)    HCT 26.1 (*)    MCV 73.7 (*)    MCH 24.6 (*)    RDW 16.7 (*)    All other components within normal limits  COMPREHENSIVE METABOLIC PANEL WITH GFR - Abnormal; Notable for the following components:   CO2 21 (*)    Glucose, Bld 128 (*)    BUN 52 (*)    Creatinine, Ser 6.83 (*)    Calcium  8.4 (*)    Total Protein 6.0 (*)    Albumin 3.1 (*)    Total Bilirubin 1.5 (*)    GFR, Estimated 9 (*)    All other components within normal limits  TROPONIN I (HIGH SENSITIVITY) - Abnormal; Notable for the following components:   Troponin I (High Sensitivity) 29 (*)  All other components within normal limits  MAGNESIUM  PHOSPHORUS  LIPASE, BLOOD    EKG None  Radiology CT ABDOMEN PELVIS WO CONTRAST Result Date: 02/06/2024 CLINICAL DATA:  Epigastric pain and abdominal distension. EXAM: CT ABDOMEN AND PELVIS WITHOUT CONTRAST TECHNIQUE: Multidetector CT imaging of the abdomen and pelvis was performed following the standard protocol without IV contrast. RADIATION DOSE REDUCTION: This exam was performed according to the departmental dose-optimization program which includes automated exposure control, adjustment of the mA and/or kV according to patient size and/or use of iterative reconstruction technique. COMPARISON:  October 02, 2023 FINDINGS: Lower chest: Diffuse interstitial thickening is noted within the bilateral lung bases. There is mild cardiomegaly with a small pericardial effusion. Hepatobiliary: No focal liver abnormality is seen. No gallstones, gallbladder wall thickening, or biliary  dilatation. Pancreas: Unremarkable. No pancreatic ductal dilatation or surrounding inflammatory changes. Spleen: Normal in size without focal abnormality. Adrenals/Urinary Tract: A stable 7.4 cm x 4.6 cm area of low attenuation, with peripheral calcification, is seen within the expected region of the right adrenal gland. A 12 mm low-attenuation left adrenal mass is noted. Kidneys are normal, without renal calculi or hydronephrosis. Multiple stable renal cysts are seen within the left kidney. Bladder is unremarkable. Stomach/Bowel: Stomach is within normal limits. Appendix appears normal. No evidence of bowel wall thickening, distention, or inflammatory changes. Vascular/Lymphatic: Aortic atherosclerosis. Stable, mild to moderate severity bilateral inguinal lymphadenopathy is noted. Reproductive: Prostate is unremarkable. Other: No abdominal wall hernia or abnormality. No abdominopelvic ascites. Musculoskeletal: No acute or significant osseous findings. IMPRESSION: 1. Mild cardiomegaly with a small pericardial effusion. 2. Stable 7.4 cm x 4.6 cm area of low attenuation, with peripheral calcification, within the expected region of the right adrenal gland. This may represent a large adrenal adenoma. 3. Stable 12 mm low-attenuation left adrenal mass, likely consistent with an adrenal adenoma. 4. Multiple stable left renal cysts. No follow-up imaging is recommended. This recommendation follows ACR consensus guidelines: Management of the Incidental Renal Mass on CT: A White Paper of the ACR Incidental Findings Committee. J Am Coll Radiol [phone]. 5. Stable, bilateral inguinal lymphadenopathy. 6. Aortic atherosclerosis. Electronically Signed   By: Virgle Grime M.D.   On: 02/06/2024 22:06   DG Chest 1 View Result Date: 02/06/2024 CLINICAL DATA:  End-stage renal disease missed dialysis EXAM: CHEST  1 VIEW COMPARISON:  12/15/2023 FINDINGS: Cardiomegaly with central congestion and mild interstitial edema. No  consolidation, pleural effusion or pneumothorax IMPRESSION: Cardiomegaly with central congestion and mild interstitial edema. Electronically Signed   By: Esmeralda Hedge M.D.   On: 02/06/2024 21:08    Medications Ordered in ED Medications - No data to display  ED Course/ Medical Decision Making/ A&P  George Day is a 48 y.o. male presents as detailed above  Differential ddx: Neuropathy, anxiety, electrolyte abnormalities, ACS, volume overload, intra-abdominal infection, musculoskeletal pain, fever  On arrival, patient afebrile hemodynamically stable no hypoxia or respiratory distress.   ED Work-up: Please see details of labs and imaging listed above. In summary, patient did not require emergent dialysis.  Troponin at baseline for patient. No EKG changes. No leukocytosis or new anemia.  CT abdomen pelvis without contrast without any acute findings.  Chest x-ray with stable findings. Patient remains afebrile hemodynamically stable in ED.  No evidence of acute life-threatening illness or injury.  Patient is scheduled for his routine dialysis session tomorrow. Counseled patient on not missing his medications and advised to take all scheduled medications when she gets home.  Advised to  follow-up with PCP soon as possible    Overall impression epigastric pain, ESRD and missed dialysis Patient stable for discharge and outpatient follow-up. Strict return precautions provided. Patient voices understanding and agrees with plan.   Patient seen with supervising physician who agrees with plan.  Final Clinical Impression(s) / ED Diagnoses Final diagnoses:  Epigastric pain  Other fatigue     Angele Keller, DO PGY-3 Emergency Medicine    Angele Keller, DO 02/06/24 2243    Guadalupe Lee, MD 02/06/24 [id number]

## 2024-02-06 NOTE — ED Triage Notes (Addendum)
 Pt BIB EMS from home with c/o epigastric pain and distension x 3 hours and feels like he is burning all over, missed dialysis Wednesday.  198/100 90HR 20RR [id number]

## 2024-02-06 NOTE — Discharge Instructions (Signed)
 Thank you for allowing us  to take care of you today.  We hope you begin feeling better soon.   To-Do:  Please follow-up with your primary doctor within the next 2-3 days. Please return to the Emergency Department or call 911 if you experience chest pain, shortness of breath, severe pain, severe fever, altered mental status, or have any reason to think that you need emergency medical care.  Thank you again.  Hope you feel better soon.  [facility] Department of Emergency Medicine

## 2024-02-07 ENCOUNTER — Encounter: Payer: Self-pay | Admitting: Podiatry

## 2024-02-07 ENCOUNTER — Ambulatory Visit: Admitting: Podiatry

## 2024-02-07 DIAGNOSIS — M79674 Pain in right toe(s): Secondary | ICD-10-CM

## 2024-02-07 DIAGNOSIS — B351 Tinea unguium: Secondary | ICD-10-CM

## 2024-02-07 DIAGNOSIS — M79675 Pain in left toe(s): Secondary | ICD-10-CM | POA: Diagnosis not present

## 2024-02-10 NOTE — Progress Notes (Signed)
 Subjective:   Patient ID: George Day, male   DOB: 48 y.o.   MRN: [id number]   HPI Patient states getting thickened nailbeds 1-5 both feet that are bothersome and making it hard to wear shoe gear comfortably   ROS      Objective:  Physical Exam  Neurovascular status intact thick yellow brittle nailbeds 1-5 both feet painful     Assessment:  Chronic mycotic nail infection with pain 1-5 both feet     Plan:  Debridement nailbeds 1-5 both feet neurogenic bleeding reappoint routine care

## 2024-02-19 ENCOUNTER — Telehealth: Payer: Self-pay

## 2024-02-20 NOTE — Progress Notes (Signed)
 This encounter was created in error - please disregard.

## 2024-03-12 ENCOUNTER — Telehealth: Payer: Self-pay | Admitting: Neurology

## 2024-03-12 NOTE — Telephone Encounter (Signed)
 Pt made request for a f/u with Dr Onita, pt connected to billing dept to address confirmed bad debt bal

## 2024-03-19 ENCOUNTER — Encounter (HOSPITAL_COMMUNITY): Payer: Self-pay | Admitting: Cardiovascular Disease

## 2024-03-19 ENCOUNTER — Ambulatory Visit (HOSPITAL_COMMUNITY)
Admission: RE | Admit: 2024-03-19 | Source: Ambulatory Visit | Attending: Cardiovascular Disease | Admitting: Cardiovascular Disease

## 2024-03-23 DIAGNOSIS — D631 Anemia in chronic kidney disease: Secondary | ICD-10-CM | POA: Diagnosis not present

## 2024-03-23 DIAGNOSIS — I10 Essential (primary) hypertension: Secondary | ICD-10-CM | POA: Diagnosis not present

## 2024-03-23 DIAGNOSIS — Z23 Encounter for immunization: Secondary | ICD-10-CM | POA: Diagnosis not present

## 2024-03-23 DIAGNOSIS — D689 Coagulation defect, unspecified: Secondary | ICD-10-CM | POA: Diagnosis not present

## 2024-03-23 DIAGNOSIS — N2581 Secondary hyperparathyroidism of renal origin: Secondary | ICD-10-CM | POA: Diagnosis not present

## 2024-03-23 DIAGNOSIS — Z992 Dependence on renal dialysis: Secondary | ICD-10-CM | POA: Diagnosis not present

## 2024-03-23 DIAGNOSIS — N186 End stage renal disease: Secondary | ICD-10-CM | POA: Diagnosis not present

## 2024-03-27 ENCOUNTER — Observation Stay (HOSPITAL_COMMUNITY)

## 2024-03-27 ENCOUNTER — Observation Stay (HOSPITAL_COMMUNITY)
Admission: EM | Admit: 2024-03-27 | Discharge: 2024-03-30 | Disposition: A | Discharge status: Home / Self Care | Attending: Internal Medicine | Admitting: Internal Medicine

## 2024-03-27 ENCOUNTER — Other Ambulatory Visit: Payer: Self-pay

## 2024-03-27 ENCOUNTER — Emergency Department (HOSPITAL_COMMUNITY)

## 2024-03-27 ENCOUNTER — Encounter (HOSPITAL_COMMUNITY): Payer: Self-pay | Admitting: Internal Medicine

## 2024-03-27 DIAGNOSIS — Z79899 Other long term (current) drug therapy: Secondary | ICD-10-CM | POA: Diagnosis not present

## 2024-03-27 DIAGNOSIS — Z87891 Personal history of nicotine dependence: Secondary | ICD-10-CM

## 2024-03-27 DIAGNOSIS — I160 Hypertensive urgency: Secondary | ICD-10-CM | POA: Diagnosis present

## 2024-03-27 DIAGNOSIS — N186 End stage renal disease: Secondary | ICD-10-CM | POA: Diagnosis not present

## 2024-03-27 DIAGNOSIS — Z880 Allergy status to penicillin: Secondary | ICD-10-CM | POA: Diagnosis not present

## 2024-03-27 DIAGNOSIS — G629 Polyneuropathy, unspecified: Secondary | ICD-10-CM

## 2024-03-27 DIAGNOSIS — I132 Hypertensive heart and chronic kidney disease with heart failure and with stage 5 chronic kidney disease, or end stage renal disease: Secondary | ICD-10-CM | POA: Diagnosis not present

## 2024-03-27 DIAGNOSIS — Z992 Dependence on renal dialysis: Secondary | ICD-10-CM

## 2024-03-27 DIAGNOSIS — R5381 Other malaise: Secondary | ICD-10-CM

## 2024-03-27 DIAGNOSIS — R918 Other nonspecific abnormal finding of lung field: Secondary | ICD-10-CM | POA: Diagnosis not present

## 2024-03-27 DIAGNOSIS — F17200 Nicotine dependence, unspecified, uncomplicated: Secondary | ICD-10-CM | POA: Diagnosis not present

## 2024-03-27 DIAGNOSIS — G609 Hereditary and idiopathic neuropathy, unspecified: Secondary | ICD-10-CM | POA: Insufficient documentation

## 2024-03-27 DIAGNOSIS — R059 Cough, unspecified: Secondary | ICD-10-CM | POA: Diagnosis not present

## 2024-03-27 DIAGNOSIS — I5032 Chronic diastolic (congestive) heart failure: Secondary | ICD-10-CM | POA: Diagnosis not present

## 2024-03-27 DIAGNOSIS — Z743 Need for continuous supervision: Secondary | ICD-10-CM | POA: Diagnosis not present

## 2024-03-27 DIAGNOSIS — I517 Cardiomegaly: Secondary | ICD-10-CM | POA: Diagnosis not present

## 2024-03-27 DIAGNOSIS — R55 Syncope and collapse: Principal | ICD-10-CM | POA: Diagnosis present

## 2024-03-27 DIAGNOSIS — J984 Other disorders of lung: Secondary | ICD-10-CM | POA: Diagnosis not present

## 2024-03-27 DIAGNOSIS — G35 Multiple sclerosis: Secondary | ICD-10-CM | POA: Diagnosis not present

## 2024-03-27 DIAGNOSIS — J449 Chronic obstructive pulmonary disease, unspecified: Secondary | ICD-10-CM | POA: Diagnosis not present

## 2024-03-27 DIAGNOSIS — R590 Localized enlarged lymph nodes: Secondary | ICD-10-CM | POA: Diagnosis not present

## 2024-03-27 DIAGNOSIS — R0603 Acute respiratory distress: Secondary | ICD-10-CM | POA: Diagnosis not present

## 2024-03-27 DIAGNOSIS — R0600 Dyspnea, unspecified: Secondary | ICD-10-CM | POA: Insufficient documentation

## 2024-03-27 DIAGNOSIS — I3139 Other pericardial effusion (noninflammatory): Secondary | ICD-10-CM | POA: Diagnosis present

## 2024-03-27 DIAGNOSIS — R519 Headache, unspecified: Secondary | ICD-10-CM | POA: Diagnosis not present

## 2024-03-27 DIAGNOSIS — F411 Generalized anxiety disorder: Secondary | ICD-10-CM | POA: Diagnosis present

## 2024-03-27 DIAGNOSIS — N2889 Other specified disorders of kidney and ureter: Secondary | ICD-10-CM | POA: Diagnosis not present

## 2024-03-27 DIAGNOSIS — R079 Chest pain, unspecified: Secondary | ICD-10-CM | POA: Diagnosis not present

## 2024-03-27 LAB — I-STAT CHEM 8, ED
BUN: 59 mg/dL — ABNORMAL HIGH (ref 6–20)
Calcium, Ion: 1.08 mmol/L — ABNORMAL LOW (ref 1.15–1.40)
Chloride: 107 mmol/L (ref 98–111)
Creatinine, Ser: 7.8 mg/dL — ABNORMAL HIGH (ref 0.61–1.24)
Glucose, Bld: 91 mg/dL (ref 70–99)
HCT: 38 % — ABNORMAL LOW (ref 39.0–52.0)
Hemoglobin: 12.9 g/dL — ABNORMAL LOW (ref 13.0–17.0)
Potassium: 3.8 mmol/L (ref 3.5–5.1)
Sodium: 141 mmol/L (ref 135–145)
TCO2: 20 mmol/L — ABNORMAL LOW (ref 22–32)

## 2024-03-27 LAB — CBC WITH DIFFERENTIAL/PLATELET
Abs Immature Granulocytes: 0.02 K/uL (ref 0.00–0.07)
Basophils Absolute: 0 K/uL (ref 0.0–0.1)
Basophils Relative: 0 %
Eosinophils Absolute: 0.2 K/uL (ref 0.0–0.5)
Eosinophils Relative: 2 %
HCT: 34.7 % — ABNORMAL LOW (ref 39.0–52.0)
Hemoglobin: 11.2 g/dL — ABNORMAL LOW (ref 13.0–17.0)
Immature Granulocytes: 0 %
Lymphocytes Relative: 17 %
Lymphs Abs: 1.3 K/uL (ref 0.7–4.0)
MCH: 25.1 pg — ABNORMAL LOW (ref 26.0–34.0)
MCHC: 32.3 g/dL (ref 30.0–36.0)
MCV: 77.6 fL — ABNORMAL LOW (ref 80.0–100.0)
Monocytes Absolute: 0.6 K/uL (ref 0.1–1.0)
Monocytes Relative: 8 %
Neutro Abs: 5.6 K/uL (ref 1.7–7.7)
Neutrophils Relative %: 73 %
Platelets: 231 K/uL (ref 150–400)
RBC: 4.47 MIL/uL (ref 4.22–5.81)
RDW: 17.4 % — ABNORMAL HIGH (ref 11.5–15.5)
WBC: 7.7 K/uL (ref 4.0–10.5)
nRBC: 0 % (ref 0.0–0.2)

## 2024-03-27 LAB — TROPONIN I (HIGH SENSITIVITY)
Troponin I (High Sensitivity): 33 ng/L — ABNORMAL HIGH (ref <18)
Troponin I (High Sensitivity): 36 ng/L — ABNORMAL HIGH (ref <18)
Troponin I (High Sensitivity): 42 ng/L — ABNORMAL HIGH (ref <18)

## 2024-03-27 LAB — COMPREHENSIVE METABOLIC PANEL WITH GFR
ALT: 35 U/L (ref 0–44)
AST: 37 U/L (ref 15–41)
Albumin: 3.8 g/dL (ref 3.5–5.0)
Alkaline Phosphatase: 84 U/L (ref 38–126)
Anion gap: 13 (ref 5–15)
BUN: 63 mg/dL — ABNORMAL HIGH (ref 6–20)
CO2: 21 mmol/L — ABNORMAL LOW (ref 22–32)
Calcium: 9.2 mg/dL (ref 8.9–10.3)
Chloride: 106 mmol/L (ref 98–111)
Creatinine, Ser: 7.36 mg/dL — ABNORMAL HIGH (ref 0.61–1.24)
GFR, Estimated: 8 mL/min — ABNORMAL LOW (ref >60)
Glucose, Bld: 95 mg/dL (ref 70–99)
Potassium: 3.9 mmol/L (ref 3.5–5.1)
Sodium: 140 mmol/L (ref 135–145)
Total Bilirubin: 1.3 mg/dL — ABNORMAL HIGH (ref 0.0–1.2)
Total Protein: 7.9 g/dL (ref 6.5–8.1)

## 2024-03-27 LAB — HEPATITIS B SURFACE ANTIGEN: Hepatitis B Surface Ag: NONREACTIVE

## 2024-03-27 LAB — MAGNESIUM: Magnesium: 2.4 mg/dL (ref 1.7–2.4)

## 2024-03-27 LAB — PHOSPHORUS: Phosphorus: 3 mg/dL (ref 2.5–4.6)

## 2024-03-27 MED ORDER — MIDAZOLAM HCL 2 MG/2ML IJ SOLN
2.0000 mg | Freq: Once | INTRAMUSCULAR | Status: AC
  Administered 2024-03-27: 2 mg via INTRAVENOUS
  Filled 2024-03-27: qty 2

## 2024-03-27 MED ORDER — AMLODIPINE BESYLATE 5 MG PO TABS
10.0000 mg | ORAL_TABLET | Freq: Once | ORAL | Status: AC
  Administered 2024-03-27: 10 mg via ORAL
  Filled 2024-03-27: qty 2

## 2024-03-27 MED ORDER — HYDRALAZINE HCL 25 MG PO TABS: 50.0000 mg | ORAL_TABLET | Freq: Once | ORAL | Status: DC

## 2024-03-27 MED ORDER — ACETAMINOPHEN 325 MG PO TABS: 650.0000 mg | ORAL_TABLET | Freq: Four times a day (QID) | ORAL | Status: DC | PRN

## 2024-03-27 MED ORDER — DULOXETINE HCL 30 MG PO CPEP
30.0000 mg | ORAL_CAPSULE | Freq: Every evening | ORAL | Status: DC
  Administered 2024-03-27 – 2024-03-29 (×3): 30 mg via ORAL
  Filled 2024-03-27 (×3): qty 1

## 2024-03-27 MED ORDER — ATORVASTATIN CALCIUM 40 MG PO TABS
40.0000 mg | ORAL_TABLET | Freq: Every day | ORAL | Status: DC
  Administered 2024-03-28 – 2024-03-29 (×3): 40 mg via ORAL
  Filled 2024-03-27 (×3): qty 1

## 2024-03-27 MED ORDER — CHLORHEXIDINE GLUCONATE CLOTH 2 % EX PADS: 6.0000 | MEDICATED_PAD | Freq: Every day | CUTANEOUS | Status: DC

## 2024-03-27 MED ORDER — LEVOFLOXACIN IN D5W 750 MG/150ML IV SOLN
750.0000 mg | Freq: Once | INTRAVENOUS | Status: AC
  Administered 2024-03-28: 750 mg via INTRAVENOUS
  Filled 2024-03-27: qty 150

## 2024-03-27 MED ORDER — IPRATROPIUM BROMIDE 0.02 % IN SOLN: 0.5000 mg | Freq: Four times a day (QID) | RESPIRATORY_TRACT | Status: DC | PRN

## 2024-03-27 MED ORDER — AMLODIPINE BESYLATE 10 MG PO TABS
10.0000 mg | ORAL_TABLET | Freq: Every day | ORAL | Status: DC
  Administered 2024-03-28 – 2024-03-29 (×2): 10 mg via ORAL
  Filled 2024-03-27 (×2): qty 1

## 2024-03-27 MED ORDER — GABAPENTIN 100 MG PO CAPS
100.0000 mg | ORAL_CAPSULE | ORAL | Status: DC
  Administered 2024-03-28: 100 mg via ORAL
  Filled 2024-03-27: qty 1

## 2024-03-27 MED ORDER — IRBESARTAN 150 MG PO TABS
150.0000 mg | ORAL_TABLET | Freq: Every day | ORAL | Status: DC
  Administered 2024-03-28 – 2024-03-29 (×2): 150 mg via ORAL
  Filled 2024-03-27 (×2): qty 1

## 2024-03-27 MED ORDER — CARVEDILOL 25 MG PO TABS
25.0000 mg | ORAL_TABLET | Freq: Two times a day (BID) | ORAL | Status: DC
  Administered 2024-03-28 – 2024-03-29 (×5): 25 mg via ORAL
  Filled 2024-03-27 (×5): qty 1

## 2024-03-27 MED ORDER — FAMOTIDINE 20 MG PO TABS
20.0000 mg | ORAL_TABLET | Freq: Every day | ORAL | Status: DC
  Administered 2024-03-27: 20 mg via ORAL
  Filled 2024-03-27: qty 1

## 2024-03-27 MED ORDER — IOHEXOL 350 MG/ML SOLN
100.0000 mL | Freq: Once | INTRAVENOUS | Status: AC | PRN
  Administered 2024-03-27: 100 mL via INTRAVENOUS

## 2024-03-27 MED ORDER — BUDESON-GLYCOPYRROL-FORMOTEROL 160-9-4.8 MCG/ACT IN AERO
2.0000 | INHALATION_SPRAY | Freq: Two times a day (BID) | RESPIRATORY_TRACT | Status: DC
  Administered 2024-03-28 – 2024-03-30 (×5): 2 via RESPIRATORY_TRACT
  Filled 2024-03-27: qty 5.9

## 2024-03-27 MED ORDER — SENNOSIDES-DOCUSATE SODIUM 8.6-50 MG PO TABS: 1.0000 | ORAL_TABLET | Freq: Every evening | ORAL | Status: DC | PRN

## 2024-03-27 MED ORDER — SODIUM CHLORIDE 0.9% FLUSH
3.0000 mL | Freq: Two times a day (BID) | INTRAVENOUS | Status: DC
  Administered 2024-03-27 – 2024-03-29 (×4): 3 mL via INTRAVENOUS

## 2024-03-27 MED ORDER — HEPARIN SODIUM (PORCINE) 5000 UNIT/ML IJ SOLN
5000.0000 [IU] | Freq: Three times a day (TID) | INTRAMUSCULAR | Status: DC
  Administered 2024-03-27 – 2024-03-30 (×9): 5000 [IU] via SUBCUTANEOUS
  Filled 2024-03-27 (×8): qty 1

## 2024-03-27 MED ORDER — LABETALOL HCL 5 MG/ML IV SOLN
10.0000 mg | Freq: Once | INTRAVENOUS | Status: AC
  Administered 2024-03-27: 10 mg via INTRAVENOUS
  Filled 2024-03-27: qty 4

## 2024-03-27 MED ORDER — LEVOFLOXACIN IN D5W 500 MG/100ML IV SOLN: 500.0000 mg | INTRAVENOUS | Status: DC

## 2024-03-27 MED ORDER — ACETAMINOPHEN 650 MG RE SUPP: 650.0000 mg | Freq: Four times a day (QID) | RECTAL | Status: DC | PRN

## 2024-03-27 MED ORDER — HYDRALAZINE HCL 25 MG PO TABS
50.0000 mg | ORAL_TABLET | Freq: Once | ORAL | Status: AC
  Administered 2024-03-27: 50 mg via ORAL
  Filled 2024-03-27: qty 2

## 2024-03-27 MED ORDER — HYDRALAZINE HCL 20 MG/ML IJ SOLN
10.0000 mg | Freq: Once | INTRAMUSCULAR | Status: AC
  Administered 2024-03-27: 10 mg via INTRAVENOUS
  Filled 2024-03-27: qty 1
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
## 2024-03-27 NOTE — ED Notes (Signed)
 Pt to CT
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
## 2024-03-27 NOTE — ED Notes (Signed)
 Pt becoming increasingly anxious, HR elevated, this RN to bedside, support provided, warm blanket given, attending notfied
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
## 2024-03-27 NOTE — ED Notes (Signed)
 Help get patient into a gown on the monitor did EKG patient is resting with call bell in reach

## 2024-03-27 NOTE — ED Notes (Signed)
 Hospitalist at bedside

## 2024-03-27 NOTE — ED Notes (Signed)
 EDP Trifan to bedside

## 2024-03-27 NOTE — Progress Notes (Signed)
 Notified Dr Waymond for elevated BP, still awaiting HD.    03/27/24 2159  Assess: MEWS Score  Temp 99.1 F (37.3 C)  BP (!) 199/108  MAP (mmHg) 134  Pulse Rate (!) 118  ECG Heart Rate (!) 120  Resp (!) 28  Level of Consciousness Alert  SpO2 96 %  O2 Device Room Air  Assess: MEWS Score  MEWS Temp 0  MEWS Systolic 0  MEWS Pulse 2  MEWS RR 2  MEWS LOC 0  MEWS Score 4  MEWS Score Color Red  Assess: if the MEWS score is Yellow or Red  Were vital signs accurate and taken at a resting state? Yes  Does the patient meet 2 or more of the SIRS criteria? Yes  Does the patient have a confirmed or suspected source of infection? No  MEWS guidelines implemented  Yes, red  Treat  MEWS Interventions Considered administering scheduled or prn medications/treatments as ordered  Take Vital Signs  Increase Vital Sign Frequency  Red: Q1hr x2, continue Q4hrs until patient remains green for 12hrs  Escalate  MEWS: Escalate Red: Discuss with charge nurse and notify provider. Consider notifying RRT. If remains red for 2 hours consider need for higher level of care  Notify: Charge Nurse/RN  Name of Charge Nurse/RN Administrator, arts, RN  Assess: SIRS CRITERIA  SIRS Temperature  0  SIRS Respirations  1  SIRS Pulse 1  SIRS WBC 0  SIRS Score Sum  2

## 2024-03-27 NOTE — Consult Note (Signed)
 George Day Admit Date: 03/27/2024 03/27/2024 George Day Requesting Physician:  Cottie MD  Reason for Consult:  ESRD Comanagement HPI:  76M ESRD MWF on iHD at [facility] using LUE AVG who had 2 episodes of blacking out earlier this morning presenting to the ED for evaluation.  He missed his dialysis today because of this.  Workup thus far unremarkable, blood pressures have been quite high but especially because he held his blood pressure medications anticipating dialysis later in the day.  Here in the ED K is 3.9, BUN 63, bicarbonate 21, hemoglobin 11.2.  Blood pressures are elevated.  No recent issues with dialysis: Outpatient prescription is 84 hours, EDW 88 kg most recently 1.8 kg above, 2K, 2 calcium  bath, BFR 400, dialysate flow 700, using left upper arm AVG with 15-gauge needles and 3000 units of heparin  every treatment.  ROS Balance of 12 systems is negative w/ exceptions as above  PMH  Past Medical History:  Diagnosis Date   (HFpEF) heart failure with preserved ejection fraction (HCC)    Acute kidney injury superimposed on chronic kidney disease (HCC)    Acute respiratory distress 02/13/2023   Anemia 1998   Anginal pain (HCC)    Anxiety    Asthma    CKD (chronic kidney disease) stage 5, GFR less than 15 ml/min (HCC)    Coronary artery disease    Diabetes mellitus without complication (HCC) 1995   Hypertension 2019   MI (myocardial infarction) (HCC) 2010   Multiple sclerosis (HCC)    Sleep apnea    Stroke (HCC) 2010   PSH  Past Surgical History:  Procedure Laterality Date   A/V SHUNT INTERVENTION N/A 11/19/2023   Procedure: A/V SHUNT INTERVENTION;  Surgeon: Tobie Gordy POUR, MD;  Location: [facility] INVASIVE CV LAB;  Service: Cardiovascular;  Laterality: N/A;   AV FISTULA PLACEMENT Left 04/24/2023   Procedure: INSERTION OF LEFT ARM ARTERIOVENOUS (AV) GOR-TEX GRAFT;  Surgeon: Magda Debby SAILOR, MD;  Location: [facility] OR;  Service: Vascular;  Laterality: Left;  with regional block   BIOPSY   08/18/2022   Procedure: BIOPSY;  Surgeon: Federico Rosario BROCKS, MD;  Location: [facility] ENDOSCOPY;  Service: Gastroenterology;;   BIOPSY  10/03/2023   Procedure: BIOPSY;  Surgeon: Albertus Gordy HERO, MD;  Location: [facility] ENDOSCOPY;  Service: Gastroenterology;;   CARDIAC CATHETERIZATION  2010   COLONOSCOPY WITH PROPOFOL  N/A 08/18/2022   Procedure: COLONOSCOPY WITH PROPOFOL ;  Surgeon: Federico Rosario BROCKS, MD;  Location: [facility] ENDOSCOPY;  Service: Gastroenterology;  Laterality: N/A;   ESOPHAGOGASTRODUODENOSCOPY (EGD) WITH PROPOFOL  N/A 08/18/2022   Procedure: ESOPHAGOGASTRODUODENOSCOPY (EGD) WITH PROPOFOL ;  Surgeon: Federico Rosario BROCKS, MD;  Location: [facility] ENDOSCOPY;  Service: Gastroenterology;  Laterality: N/A;   ESOPHAGOGASTRODUODENOSCOPY (EGD) WITH PROPOFOL  N/A 10/03/2023   Procedure: ESOPHAGOGASTRODUODENOSCOPY (EGD) WITH PROPOFOL ;  Surgeon: Albertus Gordy HERO, MD;  Location: [facility] ENDOSCOPY;  Service: Gastroenterology;  Laterality: N/A;   GIVENS CAPSULE STUDY N/A 10/22/2023   Procedure: GIVENS CAPSULE STUDY;  Surgeon: Shila Gustav GAILS, MD;  Location: [facility] ENDOSCOPY;  Service: Gastroenterology;  Laterality: N/A;   IR FLUORO GUIDE CV LINE RIGHT  03/05/2023   IR US  GUIDE VASC ACCESS RIGHT  03/05/2023   POLYPECTOMY  08/18/2022   Procedure: POLYPECTOMY;  Surgeon: Federico Rosario BROCKS, MD;  Location: [facility] ENDOSCOPY;  Service: Gastroenterology;;   VENOUS ANGIOPLASTY Left 11/19/2023   Procedure: VENOUS ANGIOPLASTY;  Surgeon: Tobie Gordy POUR, MD;  Location: [facility] INVASIVE CV LAB;  Service: Cardiovascular;  Laterality: Left;  60% Venous Anastomosis   FH  Family History  Problem Relation Age of Onset   Diabetes Mother    Hyperlipidemia Mother    Kidney disease Mother    Drug abuse Father    Diabetes Sister    Hyperlipidemia Sister    Hearing loss Sister    Diabetes Brother    Hyperlipidemia Brother    SH  reports that he quit smoking about 2 years ago. His smoking use included cigars, e-cigarettes, and cigarettes. He started smoking about 33 years ago. He  has a 15.5 pack-year smoking history. He has never used smokeless tobacco. He reports that he does not currently use alcohol. He reports that he does not currently use drugs after having used the following drugs: Marijuana. Allergies  Allergies  Allergen Reactions   Bee Venom Anaphylaxis   Penicillins Anaphylaxis   Tomato Anaphylaxis, Swelling and Other (See Comments)    Throat swells- has  tolerated recently, though   Home medications Prior to Admission medications   Medication Sig Start Date End Date Taking? Authorizing Provider  amLODipine  (NORVASC ) 10 MG tablet TAKE 1 TABLET BY MOUTH DAILY 06/25/23  Yes Gabino Boga, MD  APPLE CIDER VINEGAR PO Take 5-10 mLs by mouth See admin instructions. Mix 5-10 ml's into eight ounces of water and drink by mouth once a week   Yes [provider]  atorvastatin  (LIPITOR ) 40 MG tablet TAKE 1 TABLET BY MOUTH DAILY Patient taking differently: Take 40 mg by mouth at bedtime. 06/25/23  Yes Gabino Boga, MD  carvedilol  (COREG ) 25 MG tablet TAKE TWO (2) TABLETS BY MOUTH TWICE DAILY Patient taking differently: Take 25 mg by mouth 2 (two) times daily. 06/25/23  Yes Gabino Boga, MD  DULoxetine  (CYMBALTA ) 30 MG capsule Take 1 capsule (30 mg total) by mouth daily. Patient taking differently: Take 30 mg by mouth every evening. 07/24/23  Yes Onita Duos, MD  EPINEPHrine  0.3 mg/0.3 mL IJ SOAJ injection Inject 0.3 mg into the muscle as needed for anaphylaxis.   Yes [provider]  famotidine  (PEPCID ) 20 MG tablet TAKE 1 TABLET BY MOUTH DAILY 12/05/23  Yes Gabino Boga, MD  gabapentin  (NEURONTIN ) 100 MG capsule Take 100mg , three times a week only following dialysis. Patient taking differently: Take 100 mg by mouth See admin instructions. Take 100 mg by mouth on Mon/Wed/Fri as directed after dialysis 09/19/23  Yes Gabino Boga, MD  hydrALAZINE  (APRESOLINE ) 50 MG tablet Take 1 tablet (50 mg total) by mouth 2 (two) times daily.  01/27/24  Yes   nitroGLYCERIN  (NITROSTAT ) 0.6 MG SL tablet Place 1 tablet (0.6 mg total) under the tongue every 5 (five) minutes as needed for chest pain. 11/21/22  Yes Ngetich, Dinah C, NP  olmesartan  (BENICAR ) 20 MG tablet Take 1 tablet (20 mg total) by mouth at bedtime for high blood pressure. 04/04/23  Yes   TRELEGY ELLIPTA  100-62.5-25 MCG/ACT AEPB INHALE ONE (1) PUFF BY MOUTH DAILY Patient taking differently: Inhale 1 puff into the lungs daily. 09/10/23  Yes Gabino Boga, MD  aspirin  EC 81 MG tablet Take 1 tablet (81 mg total) by mouth daily. Patient not taking: Reported on 03/27/2024 11/21/22   Ngetich, Dinah C, NP  polyethylene glycol (MIRALAX ) 17 g packet Take 17 g by mouth daily. Patient not taking: Reported on 03/27/2024 11/05/23   Gabino Boga, MD  sevelamer  carbonate (RENVELA ) 800 MG tablet Take 1 tablet (800 mg total) by mouth 3 (three) times daily with meals. Patient not taking: Reported on 03/27/2024 04/08/23       Current  Medications Scheduled Meds:  [START ON 03/28/2024] Chlorhexidine  Gluconate Cloth  6 each Topical Q0600   hydrALAZINE   50 mg Oral Once   Continuous Infusions: PRN Meds:.  CBC Recent Labs  Lab 03/27/24 1521 03/27/24 1623  WBC 7.7  --   NEUTROABS 5.6  --   HGB 11.2* 12.9*  HCT 34.7* 38.0*  MCV 77.6*  --   PLT 231  --    Basic Metabolic Panel Recent Labs  Lab 03/27/24 1521 03/27/24 1623  NA 140 141  K 3.9 3.8  CL 106 107  CO2 21*  --   GLUCOSE 95 91  BUN 63* 59*  CREATININE 7.36* 7.80*  CALCIUM  9.2  --   PHOS 3.0  --     Physical Exam  Blood pressure (!) 186/153, pulse (!) 102, temperature 98.7 F (37.1 C), temperature source Oral, resp. rate 20, height 5' 8 (1.727 m), weight 90.7 kg, SpO2 99%. GEN: NAD, conversant, ENT: ENT EYES: EOMI CV: Regular, normal S1 and S2, no rub PULM: Clear bilaterally, normal work of breathing ABD: Soft, nontender SKIN: No rashes or lesions EXT: No significant lower extremity edema VASCULAR: Left upper  arm AV graft with bruit and thrill, no issues NEURO: Nonfocal, CN II through XII grossly intact  Assessment 72M ESRD MWF with syncopal episodes twice today.  Syncope: Workup per primary, nonfocal at this time.  Occurred before dialysis.  Severe hypertension common prior to dialysis for him. 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

## 2024-03-27 NOTE — ED Notes (Signed)
Ordered dinner tray for patient.

## 2024-03-28 ENCOUNTER — Observation Stay (HOSPITAL_BASED_OUTPATIENT_CLINIC_OR_DEPARTMENT_OTHER)

## 2024-03-28 DIAGNOSIS — D631 Anemia in chronic kidney disease: Secondary | ICD-10-CM | POA: Diagnosis not present

## 2024-03-28 DIAGNOSIS — F411 Generalized anxiety disorder: Secondary | ICD-10-CM

## 2024-03-28 DIAGNOSIS — I120 Hypertensive chronic kidney disease with stage 5 chronic kidney disease or end stage renal disease: Secondary | ICD-10-CM

## 2024-03-28 DIAGNOSIS — Z880 Allergy status to penicillin: Secondary | ICD-10-CM | POA: Diagnosis not present

## 2024-03-28 DIAGNOSIS — J449 Chronic obstructive pulmonary disease, unspecified: Secondary | ICD-10-CM

## 2024-03-28 DIAGNOSIS — I3139 Other pericardial effusion (noninflammatory): Secondary | ICD-10-CM | POA: Diagnosis not present

## 2024-03-28 DIAGNOSIS — I10 Essential (primary) hypertension: Secondary | ICD-10-CM

## 2024-03-28 DIAGNOSIS — R55 Syncope and collapse: Secondary | ICD-10-CM | POA: Diagnosis not present

## 2024-03-28 DIAGNOSIS — Z992 Dependence on renal dialysis: Secondary | ICD-10-CM

## 2024-03-28 DIAGNOSIS — I132 Hypertensive heart and chronic kidney disease with heart failure and with stage 5 chronic kidney disease, or end stage renal disease: Secondary | ICD-10-CM | POA: Diagnosis not present

## 2024-03-28 DIAGNOSIS — R590 Localized enlarged lymph nodes: Secondary | ICD-10-CM

## 2024-03-28 DIAGNOSIS — G35 Multiple sclerosis: Secondary | ICD-10-CM

## 2024-03-28 DIAGNOSIS — N186 End stage renal disease: Secondary | ICD-10-CM

## 2024-03-28 DIAGNOSIS — I5032 Chronic diastolic (congestive) heart failure: Secondary | ICD-10-CM | POA: Diagnosis not present

## 2024-03-28 DIAGNOSIS — R0603 Acute respiratory distress: Secondary | ICD-10-CM | POA: Diagnosis not present

## 2024-03-28 DIAGNOSIS — Z79899 Other long term (current) drug therapy: Secondary | ICD-10-CM

## 2024-03-28 LAB — CBC
HCT: 31.5 % — ABNORMAL LOW (ref 39.0–52.0)
Hemoglobin: 10.5 g/dL — ABNORMAL LOW (ref 13.0–17.0)
MCH: 25.1 pg — ABNORMAL LOW (ref 26.0–34.0)
MCHC: 33.3 g/dL (ref 30.0–36.0)
MCV: 75.2 fL — ABNORMAL LOW (ref 80.0–100.0)
Platelets: 214 K/uL (ref 150–400)
RBC: 4.19 MIL/uL — ABNORMAL LOW (ref 4.22–5.81)
RDW: 17 % — ABNORMAL HIGH (ref 11.5–15.5)
WBC: 8.8 K/uL (ref 4.0–10.5)
nRBC: 0 % (ref 0.0–0.2)

## 2024-03-28 LAB — BASIC METABOLIC PANEL WITH GFR
Anion gap: 12 (ref 5–15)
BUN: 57 mg/dL — ABNORMAL HIGH (ref 6–20)
CO2: 16 mmol/L — ABNORMAL LOW (ref 22–32)
Calcium: 8.9 mg/dL (ref 8.9–10.3)
Chloride: 107 mmol/L (ref 98–111)
Creatinine, Ser: 6.74 mg/dL — ABNORMAL HIGH (ref 0.61–1.24)
GFR, Estimated: 9 mL/min — ABNORMAL LOW (ref >60)
Glucose, Bld: 111 mg/dL — ABNORMAL HIGH (ref 70–99)
Potassium: 3.4 mmol/L — ABNORMAL LOW (ref 3.5–5.1)
Sodium: 135 mmol/L (ref 135–145)

## 2024-03-28 LAB — ECHOCARDIOGRAM COMPLETE
AR max vel: 2.27 cm2
AV Area VTI: 2.17 cm2
AV Area mean vel: 2.18 cm2
AV Mean grad: 7 mmHg
AV Peak grad: 13.1 mmHg
Ao pk vel: 1.81 m/s
Area-P 1/2: 4.21 cm2
Calc EF: 73.7 %
Height: 68 in
S' Lateral: 3.2 cm
Single Plane A2C EF: 74.2 %
Single Plane A4C EF: 74.8 %
Weight: 3270.4 [oz_av]

## 2024-03-28 LAB — TROPONIN I (HIGH SENSITIVITY)
Troponin I (High Sensitivity): 161 ng/L (ref <18)
Troponin I (High Sensitivity): 243 ng/L (ref <18)
Troponin I (High Sensitivity): 243 ng/L (ref <18)

## 2024-03-28 LAB — RESPIRATORY PANEL BY PCR

## 2024-03-28 LAB — HEPATITIS B SURFACE ANTIBODY, QUANTITATIVE: Hep B S AB Quant (Post): 1172 m[IU]/mL

## 2024-03-28 LAB — MRSA NEXT GEN BY PCR, NASAL: MRSA by PCR Next Gen: NOT DETECTED

## 2024-03-28 LAB — SARS CORONAVIRUS 2 BY RT PCR: SARS Coronavirus 2 by RT PCR: NEGATIVE

## 2024-03-28 MED ORDER — ONDANSETRON HCL 4 MG/2ML IJ SOLN
4.0000 mg | Freq: Once | INTRAMUSCULAR | Status: AC
  Administered 2024-03-28: 4 mg via INTRAVENOUS
  Filled 2024-03-28: qty 2

## 2024-03-28 MED ORDER — ORAL CARE MOUTH RINSE
15.0000 mL | OROMUCOSAL | Status: DC | PRN
Start: 2024-03-28 — End: 2024-03-30

## 2024-03-28 MED ORDER — HYDRALAZINE HCL 50 MG PO TABS
50.0000 mg | ORAL_TABLET | Freq: Two times a day (BID) | ORAL | Status: DC
  Administered 2024-03-28 – 2024-03-29 (×5): 50 mg via ORAL
  Filled 2024-03-28 (×5): qty 1

## 2024-03-28 MED ORDER — FAMOTIDINE 20 MG PO TABS
10.0000 mg | ORAL_TABLET | Freq: Every day | ORAL | Status: DC
  Administered 2024-03-28 – 2024-03-29 (×2): 10 mg via ORAL
  Filled 2024-03-28 (×2): qty 1

## 2024-03-28 NOTE — Consult Note (Addendum)
 Cardiology Consultation   Patient ID: George Day MRN: [id number]; DOB: [birth year]  Admit date: 03/27/2024 Date of Consult: 03/28/2024  PCP:  Benuel Braun, DO   [facility] Providers Cardiologist:  Dorn Lesches, MD       Patient Profile: George Day is a 49 y.o. male with a hx of CAD, type 2 DM, ESRD on HD, adrenal mass, anemia, HTN, HLD, OSA on CPAP, asthma, diastolic heart failure, multiple sclerosis who is being seen 03/28/2024 for the evaluation of syncope at the request of Dr. Lovie.  History of Present Illness: George Day is a 48 year old male with above medical history who is followed by Dr. Lesches.   Patient previously admitted in 07/2020 with CP. hsTn flat, not consistent with ACS. Echocardiogram 08/09/20 showed 50-55%, no regional wall motion abnormalities, moderate LVH, grade 2 DD, normal RV systolic function.  Underwent outpatient nuclear stress test on 09/14/2020 that showed no evidence of ischemia or infarction.  EF 42% on stress test.  Later, patient underwent echocardiogram 09/18/22 that showed EF 50-55%, no regional wall motion abnormalities, severe concentric LVH, normal RV systolic function, moderate circumferential pericardial effusion without signs of tamponade, mild MR. It was suspected that LVH related to poorly controlled HTN. To rule out amyloidosis, patient underwent cMRI 01/04/23 that showed normal LV size with moderate LV hypertrophy, borderline elevated extracellular volume percentage, suspected increased myocardial fibrosis in setting of hypertension.  Most recent echocardiogram from 01/2023 showed EF 50-55%, severe concentric LVH, grade 3 diastolic dysfunction, normal RV systolic function, large pericardial effusion, no evidence of tamponade, mild MR. Suspected that his pericardial effusion was uremic. Started HD in 02/2023. [place] moved to [place]  but has since moved back to [place]  He was last seen by Dr. Lesches 02/05/24. AT that visit,  patient's BP was elevated to 229/134. He had not taken his home BP medications. A repeat echo was ordered but was not completed   Patient presented to the ED 8/8 after he had 2 syncopal episodes.  In triage, patient reported that these episodes were preceded by blurry vision and dizziness/lightheadedness.  He did not hit his head.  Last dialysis was Wednesday 8/6  In the ED, initial blood pressure elevated 209/114.  Labs significant for K3.9, creatinine 7.36, hemoglobin 11.2.  High-sensitivity troponin 33>36>42>161>243>243.  Chest x-ray showed mild cardiomegaly with no acute cardiopulmonary process.  CTA chest/abdomen/pelvis showed diffuse bronchial wall thickening with peribronchial vascular groundglass and consolidative airspace opacities most prominent along the bilateral lower lobes and right apex.  Finding may represent infection, COVID-19 infection not excluded.  Also noted bilateral hilar lymphadenopathy likely reactive in etiology, no acute thoracic or abdominal aorta abnormality, no PE.  Nephrology was consulted and patient was dialyzed on 8/8.  On interview, patient reports that he had 2 episodes of syncope yesterday before coming to the hospital.  He had been driving his wife to work when he started to feel his peripheral vision go blurry.  Believes he lost consciousness.  He pulled over and prayed, felt better, and continue driving.  He later was pulling out of the driveway while driving, again started to feel his vision go blurry around the periphery.  He pulled back into his driveway and walked inside.  Reports that he lost consciousness when getting inside.  He denies any chest pain, palpitations.  Reports that yesterday he was a bit short of breath and had a cough.  Denies fever, body aches, nasal congestion, sore throat.  He reports compliance  with dialysis recently, last dialysis was Wednesday 8/6.  He checks his blood pressure at home when he remembers to, reports it has been running very  high recently.  He sometimes forgets to take his blood pressure medications.  Patient admits to smoking marijuana.  Does not drink alcohol.   Past Medical History:  Diagnosis Date   (HFpEF) heart failure with preserved ejection fraction (HCC)    Acute kidney injury superimposed on chronic kidney disease (HCC)    Acute respiratory distress 02/13/2023   Anemia 1998   Anginal pain (HCC)    Anxiety    Asthma    CKD (chronic kidney disease) stage 5, GFR less than 15 ml/min (HCC)    Coronary artery disease    Diabetes mellitus without complication (HCC) 1995   Hypertension 2019   MI (myocardial infarction) (HCC) 2010   Multiple sclerosis (HCC)    Sleep apnea    Stroke (HCC) 2010    Past Surgical History:  Procedure Laterality Date   A/V SHUNT INTERVENTION N/A 11/19/2023   Procedure: A/V SHUNT INTERVENTION;  Surgeon: Tobie Gordy POUR, MD;  Location: [facility] INVASIVE CV LAB;  Service: Cardiovascular;  Laterality: N/A;   AV FISTULA PLACEMENT Left 04/24/2023   Procedure: INSERTION OF LEFT ARM ARTERIOVENOUS (AV) GOR-TEX GRAFT;  Surgeon: Magda Debby SAILOR, MD;  Location: [facility] OR;  Service: Vascular;  Laterality: Left;  with regional block   BIOPSY  08/18/2022   Procedure: BIOPSY;  Surgeon: Federico Rosario BROCKS, MD;  Location: [facility] ENDOSCOPY;  Service: Gastroenterology;;   BIOPSY  10/03/2023   Procedure: BIOPSY;  Surgeon: Albertus Gordy HERO, MD;  Location: [facility] ENDOSCOPY;  Service: Gastroenterology;;   CARDIAC CATHETERIZATION  2010   COLONOSCOPY WITH PROPOFOL  N/A 08/18/2022   Procedure: COLONOSCOPY WITH PROPOFOL ;  Surgeon: Federico Rosario BROCKS, MD;  Location: [facility] ENDOSCOPY;  Service: Gastroenterology;  Laterality: N/A;   ESOPHAGOGASTRODUODENOSCOPY (EGD) WITH PROPOFOL  N/A 08/18/2022   Procedure: ESOPHAGOGASTRODUODENOSCOPY (EGD) WITH PROPOFOL ;  Surgeon: Federico Rosario BROCKS, MD;  Location: [facility] ENDOSCOPY;  Service: Gastroenterology;  Laterality: N/A;   ESOPHAGOGASTRODUODENOSCOPY (EGD) WITH PROPOFOL  N/A 10/03/2023   Procedure:  ESOPHAGOGASTRODUODENOSCOPY (EGD) WITH PROPOFOL ;  Surgeon: Albertus Gordy HERO, MD;  Location: [facility] ENDOSCOPY;  Service: Gastroenterology;  Laterality: N/A;   GIVENS CAPSULE STUDY N/A 10/22/2023   Procedure: GIVENS CAPSULE STUDY;  Surgeon: Shila Gustav GAILS, MD;  Location: [facility] ENDOSCOPY;  Service: Gastroenterology;  Laterality: N/A;   IR FLUORO GUIDE CV LINE RIGHT  03/05/2023   IR US  GUIDE VASC ACCESS RIGHT  03/05/2023   POLYPECTOMY  08/18/2022   Procedure: POLYPECTOMY;  Surgeon: Federico Rosario BROCKS, MD;  Location: [facility] ENDOSCOPY;  Service: Gastroenterology;;   VENOUS ANGIOPLASTY Left 11/19/2023   Procedure: VENOUS ANGIOPLASTY;  Surgeon: Tobie Gordy POUR, MD;  Location: [facility] INVASIVE CV LAB;  Service: Cardiovascular;  Laterality: Left;  60% Venous Anastomosis     Scheduled Meds:  amLODipine   10 mg Oral Daily   atorvastatin   40 mg Oral QHS   budesonide -glycopyrrolate -formoterol   2 puff Inhalation BID   carvedilol   25 mg Oral BID   DULoxetine   30 mg Oral QPM   famotidine   10 mg Oral Daily   gabapentin   100 mg Oral Q M,W,F-2000   heparin   5,000 Units Subcutaneous Q8H   hydrALAZINE   50 mg Oral BID   irbesartan   150 mg Oral Daily   sodium chloride  flush  3 mL Intravenous Q12H   Continuous Infusions:  [START ON 03/30/2024] levofloxacin  (LEVAQUIN ) IV     PRN Meds: acetaminophen  **  OR** acetaminophen , ipratropium, mouth rinse, senna-docusate  Allergies:    Allergies  Allergen Reactions   Bee Venom Anaphylaxis   Penicillins Anaphylaxis   Tomato Anaphylaxis, Swelling and Other (See Comments)    Throat swells- has  tolerated recently, though    Social History:   Social History   Socioeconomic History   Marital status: Married    Spouse name: Not on file   Number of children: 4   Years of education: Not on file   Highest education level: 10th grade  Occupational History   Occupation: Hydrologist  Tobacco Use   Smoking status: Former    Current packs/day: 0.00    Average packs/day: 0.5  packs/day for 31.0 years (15.5 ttl pk-yrs)    Types: Cigars, E-cigarettes, Cigarettes    Start date: 09/1990    Quit date: 09/2021    Years since quitting: 2.5   Smokeless tobacco: Never  Vaping Use   Vaping status: Never Used  Substance and Sexual Activity   Alcohol use: Not Currently    Comment: occasioanlly    Drug use: Not Currently    Types: Marijuana    Comment: 2 x week   Sexual activity: Yes    Birth control/protection: None  Other Topics Concern   Not on file  Social History Narrative   Not on file   Social Drivers of Health   Financial Resource Strain: Low Risk  (01/08/2024)   Overall Financial Resource Strain (CARDIA)    Difficulty of Paying Living Expenses: Not very hard  Food Insecurity: No Food Insecurity (03/27/2024)   Hunger Vital Sign    Worried About Running Out of Food in the Last Year: Never true    Ran Out of Food in the Last Year: Never true  Transportation Needs: No Transportation Needs (03/27/2024)   PRAPARE - Administrator, Civil Service (Medical): No    Lack of Transportation (Non-Medical): No  Physical Activity: Not on file  Stress: Not on file  Social Connections: Moderately Integrated (10/20/2023)   Social Connection and Isolation Panel    Frequency of Communication with Friends and Family: More than three times a week    Frequency of Social Gatherings with Friends and Family: Three times a week    Attends Religious Services: Never    Active Member of Clubs or Organizations: No    Attends Banker Meetings: 1 to 4 times per year    Marital Status: Married  Catering manager Violence: Not At Risk (03/27/2024)   Humiliation, Afraid, Rape, and Kick questionnaire    Fear of Current or Ex-Partner: No    Emotionally Abused: No    Physically Abused: No    Sexually Abused: No    Family History:    Family History  Problem Relation Age of Onset   Diabetes Mother    Hyperlipidemia Mother    Kidney disease Mother    Drug abuse  Father    Diabetes Sister    Hyperlipidemia Sister    Hearing loss Sister    Diabetes Brother    Hyperlipidemia Brother      ROS:  Please see the history of present illness.   All other ROS reviewed and negative.     Physical Exam/Data: Vitals:   03/28/24 0500 03/28/24 0826 03/28/24 0829 03/28/24 1148  BP: (!) 148/85  (!) 166/95 107/65  Pulse: 80  90 70  Resp: 18  20 20   Temp: 98.8 F (37.1 C)  98.3 F (36.8  C) 98.4 F (36.9 C)  TempSrc: Oral  Oral Oral  SpO2: 96% 99% 94% 92%  Weight:      Height:        Intake/Output Summary (Last 24 hours) at 03/28/2024 1217 Last data filed at 03/28/2024 9167 Gross per 24 hour  Intake 626.25 ml  Output 3650 ml  Net -3023.75 ml      03/27/2024    9:59 PM 03/27/2024    2:44 PM 02/06/2024    8:37 PM  Last 3 Weights  Weight (lbs) 204 lb 6.4 oz 200 lb 211 lb 3.2 oz  Weight (kg) 92.715 kg 90.719 kg 95.8 kg     Body mass index is 31.08 kg/m.  General:  Well nourished, well developed, in no acute distress.  Sitting comfortably in the armchair HEENT: normal Neck: no JVD Cardiac:  normal S1, S2; RRR; no murmur  Lungs:  clear to auscultation bilaterally, no wheezing, rhonchi or rales.  Normal work of breathing on room air Abd: soft, nontender  Ext: no edema bilateral lower extremities Musculoskeletal:  No deformities Skin: warm and dry  Neuro: no focal abnormalities noted Psych:  Normal affect   EKG:  The EKG was personally reviewed and demonstrates:  EKG in the ED showed sinus rhythm with heart rate 94 bpm, LVH, borderline prolonged PR interval measuring 197 ms, nonspecific T wave abnormalities Telemetry:  Telemetry was personally reviewed and demonstrates: Normal sinus rhythm with occasional PACs  Relevant CV Studies: Cardiac Studies & Procedures   ______________________________________________________________________________________________   STRESS TESTS  MYOCARDIAL PERFUSION IMAGING 09/14/2020  Interpretation Summary  The  left ventricular ejection fraction is moderately decreased (30-44%).  Nuclear stress EF: 42%.  ST segment depression was noted during stress in the II, III, aVF and V6 leads, and returning to baseline after 1-5 minutes of recovery.  No evidence of ischemia or infarct on nuclear perfusion study.  This is an intermediate risk study due to moderately reduced LVEF.  Powell Sorrow, MD   ECHOCARDIOGRAM  ECHOCARDIOGRAM COMPLETE 03/28/2024  Narrative ECHOCARDIOGRAM REPORT    Patient Name:   George Day Date of Exam: 03/28/2024 Medical Rec #:  [id number]      Height:       68.0 in Accession #:    [id number]     Weight:       204.4 lb Date of Birth:  September 22, 1975       BSA:          2.063 m Patient Age:    48 years       BP:           171/93 mmHg Patient Gender: M              HR:           94 bpm. Exam Location:  Inpatient  Procedure: 2D Echo, 3D Echo, Cardiac Doppler, Color Doppler and Strain Analysis (Both Spectral and Color Flow Doppler were utilized during procedure).  STAT ECHO  Indications:    Pericardial Effusion  History:        Patient has prior history of Echocardiogram examinations. CHF, CAD, COPD, Signs/Symptoms:Dyspnea; Risk Factors:Hypertension, Diabetes and Dyslipidemia. CKD Stage 4.  Sonographer:    Philomena Daring Referring Phys: ELNORA IP  IMPRESSIONS   1. Left ventricular ejection fraction, by estimation, is 60 to 65%. The left ventricle has normal function. The left ventricle has no regional wall motion abnormalities. There is severe asymmetric left ventricular hypertrophy of the lateral segment. Left ventricular diastolic  parameters are indeterminate. The average left ventricular global longitudinal strain is -10.7 %. 2. Right ventricular systolic function is normal. The right ventricular size is normal. Tricuspid regurgitation signal is inadequate for assessing PA pressure. 3. Small to moderate circumferential pericardial effusion. Moderate  effusion along RA. There is no evidence of cardiac tamponade. 4. The mitral valve is normal in structure. Trivial mitral valve regurgitation. No evidence of mitral stenosis. 5. The aortic valve has an indeterminant number of cusps. Aortic valve regurgitation is not visualized. No aortic stenosis is present. Aortic valve mean gradient measures 7.0 mmHg. 6. The inferior vena cava is normal in size with greater than 50% respiratory variability, suggesting right atrial pressure of 3 mmHg.  Comparison(s): Changes from prior study are noted. Pericardial effusion was previously large circumferential.  FINDINGS Left Ventricle: Left ventricular ejection fraction, by estimation, is 60 to 65%. The left ventricle has normal function. The left ventricle has no regional wall motion abnormalities. The average left ventricular global longitudinal strain is -10.7 %. Strain was performed and the global longitudinal strain is indeterminate. The left ventricular internal cavity size was normal in size. There is severe asymmetric left ventricular hypertrophy of the lateral segment. Left ventricular diastolic parameters are indeterminate.  Right Ventricle: The right ventricular size is normal. No increase in right ventricular wall thickness. Right ventricular systolic function is normal. Tricuspid regurgitation signal is inadequate for assessing PA pressure.  Left Atrium: Left atrial size was normal in size.  Right Atrium: Right atrial size was normal in size.  Pericardium: Small to moderate circumferential pericardial effusion. Moderate effusion along RA. A moderately sized pericardial effusion is present. The pericardial effusion is circumferential. There is no evidence of cardiac tamponade.  Mitral Valve: The mitral valve is normal in structure. Trivial mitral valve regurgitation. No evidence of mitral valve stenosis.  Tricuspid Valve: The tricuspid valve is normal in structure. Tricuspid valve regurgitation is  not demonstrated. No evidence of tricuspid stenosis.  Aortic Valve: The aortic valve has an indeterminant number of cusps. Aortic valve regurgitation is not visualized. No aortic stenosis is present. Aortic valve mean gradient measures 7.0 mmHg. Aortic valve peak gradient measures 13.1 mmHg. Aortic valve area, by VTI measures 2.17 cm.  Pulmonic Valve: The pulmonic valve was normal in structure. Pulmonic valve regurgitation is trivial. No evidence of pulmonic stenosis.  Aorta: The aortic root and ascending aorta are structurally normal, with no evidence of dilitation.  Venous: The inferior vena cava is normal in size with greater than 50% respiratory variability, suggesting right atrial pressure of 3 mmHg.  IAS/Shunts: No atrial level shunt detected by color flow Doppler.  Additional Comments: 3D was performed not requiring image post processing on an independent workstation and was indeterminate.   LEFT VENTRICLE PLAX 2D LVIDd:         4.60 cm      Diastology LVIDs:         3.20 cm      LV e' medial:    6.74 cm/s LV PW:         1.40 cm      LV E/e' medial:  17.2 LV IVS:        1.40 cm      LV e' lateral:   8.92 cm/s LVOT diam:     2.00 cm      LV E/e' lateral: 13.0 LV SV:         68 LV SV Index:   33  2D Longitudinal Strain LVOT Area:     3.14 cm     2D Strain GLS Avg:     -10.7 %  LV Volumes (MOD) LV vol d, MOD A2C: 91.0 ml LV vol d, MOD A4C: 153.0 ml LV vol s, MOD A2C: 23.5 ml LV vol s, MOD A4C: 38.6 ml LV SV MOD A2C:     67.5 ml LV SV MOD A4C:     153.0 ml LV SV MOD BP:      86.9 ml  RIGHT VENTRICLE             IVC RV S prime:     16.00 cm/s  IVC diam: 1.60 cm TAPSE (M-mode): 3.2 cm  LEFT ATRIUM             Index        RIGHT ATRIUM           Index LA diam:        4.00 cm 1.94 cm/m   RA Area:     17.60 cm LA Vol (A2C):   55.1 ml 26.71 ml/m  RA Volume:   43.60 ml  21.14 ml/m LA Vol (A4C):   58.5 ml 28.36 ml/m LA Biplane Vol: 61.2 ml 29.67 ml/m AORTIC  VALVE AV Area (Vmax):    2.27 cm AV Area (Vmean):   2.18 cm AV Area (VTI):     2.17 cm AV Vmax:           181.00 cm/s AV Vmean:          123.000 cm/s AV VTI:            0.313 m AV Peak Grad:      13.1 mmHg AV Mean Grad:      7.0 mmHg LVOT Vmax:         131.00 cm/s LVOT Vmean:        85.500 cm/s LVOT VTI:          0.216 m LVOT/AV VTI ratio: 0.69  AORTA Ao Root diam: 2.50 cm Ao Asc diam:  3.30 cm  MITRAL VALVE MV Area (PHT): 4.21 cm     SHUNTS MV Decel Time: 180 msec     Systemic VTI:  0.22 m MV E velocity: 116.00 cm/s  Systemic Diam: 2.00 cm MV A velocity: 84.00 cm/s MV E/A ratio:  1.38  Vishnu Priya Mallipeddi Electronically signed by Diannah Late Mallipeddi Signature Date/Time: 03/28/2024/8:40:30 AM    Final        CARDIAC MRI  MR CARDIAC MORPHOLOGY W WO CONTRAST 01/04/2023  Narrative CLINICAL DATA:  Cardiomyopathy with LVH  EXAM: CARDIAC MRI  TECHNIQUE: The patient was scanned on a 1.5 Tesla GE magnet. A dedicated cardiac coil was used. Functional imaging was done using Fiesta sequences. 2,3, and 4 chamber views were done to assess for RWMA's. Modified Simpson's rule using a short axis stack was used to calculate an ejection fraction on a dedicated work Research officer, trade [employer]. The patient received 10 cc of Gadavist . After 10 minutes inversion recovery sequences were used to assess for infiltration and scar tissue.  FINDINGS: Moderate pericardial effusion, primarily inferior to heart. No evidence for tamponade (no respirophasic variation of the interventricular septum on free-breathing sequences).  Normal left ventricular size with moderate concentric LV hypertrophy, global hypokinesis with EF 49%. Normal right ventricular size with EF 51%. Moderately dilated left atrium, normal right atrium. Trileaflet aortic valve, no stenosis or regurgitation. Visually, mitral regurgitation looks mild.  On delayed  enhancement imaging, there was possible  subtle mid-wall late gadolinium enhancement (LGE) noted in the basal to mid septum.  MEASUREMENTS: MEASUREMENTS LVEDV 209 mL  LVEDVi 99 ml/m2  LVSV 102 mL  LVEF 49%  RVEDV 174 mL  RVEDVi 83 mL/m2  RVSV 89 mL RVEF 51%  Aortic flows do not look accurate.  T1 1082, ECV 30%  IMPRESSION: 1. Normal LV size with moderate LV hypertrophy. EF 49%, diffuse mild hypokinesis.  2.  Normal RV size and systolic function, EF 51%.  3. Subtle mid-wall LGE primarily in the basal to mid septum. This is nonspecific.  4. Borderline elevated extracellular volume percentage, suspect increased myocardial fibrosis in setting of hypertension.  5. Moderate pericardial effusion, primarily inferior to heart. No evidence for tamponade.  Dalton Mclean   Electronically Signed By: Ezra Shuck M.D. On: 01/06/2023 10:51   ______________________________________________________________________________________________       Laboratory Data: High Sensitivity Troponin:   Recent Labs  Lab 03/27/24 1721 03/27/24 2228 03/28/24 0104 03/28/24 0455 03/28/24 0821  TROPONINIHS 36* 42* 161* 243* 243*     Chemistry Recent Labs  Lab 03/27/24 1521 03/27/24 1623 03/28/24 0104  NA 140 141 135  K 3.9 3.8 3.4*  CL 106 107 107  CO2 21*  --  16*  GLUCOSE 95 91 111*  BUN 63* 59* 57*  CREATININE 7.36* 7.80* 6.74*  CALCIUM  9.2  --  8.9  MG 2.4  --   --   GFRNONAA 8*  --  9*  ANIONGAP 13  --  12    Recent Labs  Lab 03/27/24 1521  PROT 7.9  ALBUMIN 3.8  AST 37  ALT 35  ALKPHOS 84  BILITOT 1.3*   Lipids No results for input(s): CHOL, TRIG, HDL, LABVLDL, LDLCALC, CHOLHDL in the last 168 hours.  Hematology Recent Labs  Lab 03/27/24 1521 03/27/24 1623 03/28/24 0104  WBC 7.7  --  8.8  RBC 4.47  --  4.19*  HGB 11.2* 12.9* 10.5*  HCT 34.7* 38.0* 31.5*  MCV 77.6*  --  75.2*  MCH 25.1*  --  25.1*  MCHC 32.3  --  33.3  RDW 17.4*  --  17.0*  PLT 231  --  214   Thyroid No  results for input(s): TSH, FREET4 in the last 168 hours.  BNPNo results for input(s): BNP, PROBNP in the last 168 hours.  DDimer No results for input(s): DDIMER in the last 168 hours.  Radiology/Studies:  ECHOCARDIOGRAM COMPLETE Result Date: 03/28/2024    ECHOCARDIOGRAM REPORT   Patient Name:   George Day Date of Exam: 03/28/2024 Medical Rec #:  [id number]      Height:       68.0 in Accession #:    [id number]     Weight:       204.4 lb Date of Birth:  [birth year]       BSA:          2.063 m Patient Age:    48 years       BP:           171/93 mmHg Patient Gender: M              HR:           94 bpm. Exam Location:  Inpatient Procedure: 2D Echo, 3D Echo, Cardiac Doppler, Color Doppler and Strain Analysis            (Both Spectral and Color Flow Doppler were utilized during  procedure). STAT ECHO Indications:    Pericardial Effusion  History:        Patient has prior history of Echocardiogram examinations. CHF,                 CAD, COPD, Signs/Symptoms:Dyspnea; Risk Factors:Hypertension,                 Diabetes and Dyslipidemia. CKD Stage 4.  Sonographer:    Philomena Daring Referring Phys: ELNORA IP IMPRESSIONS  1. Left ventricular ejection fraction, by estimation, is 60 to 65%. The left ventricle has normal function. The left ventricle has no regional wall motion abnormalities. There is severe asymmetric left ventricular hypertrophy of the lateral segment. Left ventricular diastolic parameters are indeterminate. The average left ventricular global longitudinal strain is -10.7 %.  2. Right ventricular systolic function is normal. The right ventricular size is normal. Tricuspid regurgitation signal is inadequate for assessing PA pressure.  3. Small to moderate circumferential pericardial effusion. Moderate effusion along RA. There is no evidence of cardiac tamponade.  4. The mitral valve is normal in structure. Trivial mitral valve regurgitation. No evidence of mitral stenosis.  5. The  aortic valve has an indeterminant number of cusps. Aortic valve regurgitation is not visualized. No aortic stenosis is present. Aortic valve mean gradient measures 7.0 mmHg.  6. The inferior vena cava is normal in size with greater than 50% respiratory variability, suggesting right atrial pressure of 3 mmHg. Comparison(s): Changes from prior study are noted. Pericardial effusion was previously large circumferential. FINDINGS  Left Ventricle: Left ventricular ejection fraction, by estimation, is 60 to 65%. The left ventricle has normal function. The left ventricle has no regional wall motion abnormalities. The average left ventricular global longitudinal strain is -10.7 %. Strain was performed and the global longitudinal strain is indeterminate. The left ventricular internal cavity size was normal in size. There is severe asymmetric left ventricular hypertrophy of the lateral segment. Left ventricular diastolic parameters are indeterminate. Right Ventricle: The right ventricular size is normal. No increase in right ventricular wall thickness. Right ventricular systolic function is normal. Tricuspid regurgitation signal is inadequate for assessing PA pressure. Left Atrium: Left atrial size was normal in size. Right Atrium: Right atrial size was normal in size. Pericardium: Small to moderate circumferential pericardial effusion. Moderate effusion along RA. A moderately sized pericardial effusion is present. The pericardial effusion is circumferential. There is no evidence of cardiac tamponade. Mitral Valve: The mitral valve is normal in structure. Trivial mitral valve regurgitation. No evidence of mitral valve stenosis. Tricuspid Valve: The tricuspid valve is normal in structure. Tricuspid valve regurgitation is not demonstrated. No evidence of tricuspid stenosis. Aortic Valve: The aortic valve has an indeterminant number of cusps. Aortic valve regurgitation is not visualized. No aortic stenosis is present. Aortic  valve mean gradient measures 7.0 mmHg. Aortic valve peak gradient measures 13.1 mmHg. Aortic valve area, by VTI measures 2.17 cm. Pulmonic Valve: The pulmonic valve was normal in structure. Pulmonic valve regurgitation is trivial. No evidence of pulmonic stenosis. Aorta: The aortic root and ascending aorta are structurally normal, with no evidence of dilitation. Venous: The inferior vena cava is normal in size with greater than 50% respiratory variability, suggesting right atrial pressure of 3 mmHg. IAS/Shunts: No atrial level shunt detected by color flow Doppler. Additional Comments: 3D was performed not requiring image post processing on an independent workstation and was indeterminate.  LEFT VENTRICLE PLAX 2D LVIDd:         4.60 cm  Diastology LVIDs:         3.20 cm      LV e' medial:    6.74 cm/s LV PW:         1.40 cm      LV E/e' medial:  17.2 LV IVS:        1.40 cm      LV e' lateral:   8.92 cm/s LVOT diam:     2.00 cm      LV E/e' lateral: 13.0 LV SV:         68 LV SV Index:   33           2D Longitudinal Strain LVOT Area:     3.14 cm     2D Strain GLS Avg:     -10.7 %  LV Volumes (MOD) LV vol d, MOD A2C: 91.0 ml LV vol d, MOD A4C: 153.0 ml LV vol s, MOD A2C: 23.5 ml LV vol s, MOD A4C: 38.6 ml LV SV MOD A2C:     67.5 ml LV SV MOD A4C:     153.0 ml LV SV MOD BP:      86.9 ml RIGHT VENTRICLE             IVC RV S prime:     16.00 cm/s  IVC diam: 1.60 cm TAPSE (M-mode): 3.2 cm LEFT ATRIUM             Index        RIGHT ATRIUM           Index LA diam:        4.00 cm 1.94 cm/m   RA Area:     17.60 cm LA Vol (A2C):   55.1 ml 26.71 ml/m  RA Volume:   43.60 ml  21.14 ml/m LA Vol (A4C):   58.5 ml 28.36 ml/m LA Biplane Vol: 61.2 ml 29.67 ml/m  AORTIC VALVE AV Area (Vmax):    2.27 cm AV Area (Vmean):   2.18 cm AV Area (VTI):     2.17 cm AV Vmax:           181.00 cm/s AV Vmean:          123.000 cm/s AV VTI:            0.313 m AV Peak Grad:      13.1 mmHg AV Mean Grad:      7.0 mmHg LVOT Vmax:          131.00 cm/s LVOT Vmean:        85.500 cm/s LVOT VTI:          0.216 m LVOT/AV VTI ratio: 0.69  AORTA Ao Root diam: 2.50 cm Ao Asc diam:  3.30 cm MITRAL VALVE MV Area (PHT): 4.21 cm     SHUNTS MV Decel Time: 180 msec     Systemic VTI:  0.22 m MV E velocity: 116.00 cm/s  Systemic Diam: 2.00 cm MV A velocity: 84.00 cm/s MV E/A ratio:  1.38 Vishnu Priya Mallipeddi Electronically signed by Diannah Late Mallipeddi Signature Date/Time: 03/28/2024/8:40:30 AM    Final    CT Angio Chest/Abd/Pel for Dissection W and/or Wo Contrast Result Date: 03/27/2024 CLINICAL DATA:  Acute aortic syndrome (AAS) suspected.  Dizziness EXAM: CT ANGIOGRAPHY CHEST, ABDOMEN AND PELVIS TECHNIQUE: Non-contrast CT of the chest was initially obtained. Multidetector CT imaging through the chest, abdomen and pelvis was performed using the standard protocol during bolus administration of intravenous contrast. Multiplanar reconstructed images and  MIPs were obtained and reviewed to evaluate the vascular anatomy. RADIATION DOSE REDUCTION: This exam was performed according to the departmental dose-optimization program which includes automated exposure control, adjustment of the mA and/or kV according to patient size and/or use of iterative reconstruction technique. CONTRAST:  OMNIPAQUE  IOHEXOL  350 MG/ML SOLN COMPARISON:  Chest x-ray 03/27/2024, CT abdomen pelvis 02/06/2024 FINDINGS: CTA CHEST FINDINGS Cardiovascular: Satisfactory opacification of the pulmonary arteries to the segmental level. No evidence of pulmonary embolism. Enlarged heart size. No significant pericardial effusion. The thoracic aorta is normal in caliber. No thoracic aorta dissection. No atherosclerotic plaque of the thoracic aorta. No coronary artery calcifications. Mediastinum/Nodes: Right hilar lymphadenopathy measuring up to 1.7 cm. Left hilar lymphadenopathy measuring up to 1 cm. No enlarged mediastinal or axillary lymph nodes. Thyroid gland, trachea, and esophagus demonstrate  no significant findings. Lungs/Pleura: Diffuse bronchial wall thickening with peribronchovascular ground-glass and consolidative airspace opacities most prominent along the bilateral lower lobes and right apex (7:28). No pulmonary nodule. No pulmonary mass. Trace bilateral pleural effusion. No pneumothorax. Musculoskeletal: No chest wall abnormality. No suspicious lytic or blastic osseous lesions. No acute displaced fracture. Review of the MIP images confirms the above findings. CTA ABDOMEN AND PELVIS FINDINGS VASCULAR Aorta: Normal caliber aorta without aneurysm, dissection, vasculitis or significant stenosis. Celiac: Patent without evidence of aneurysm, dissection, vasculitis or significant stenosis. SMA: Patent without evidence of aneurysm, dissection, vasculitis or significant stenosis. Renals: Both renal arteries are patent without evidence of aneurysm, dissection, vasculitis, fibromuscular dysplasia or significant stenosis. IMA: Patent without evidence of aneurysm, dissection, vasculitis or significant stenosis. Inflow: Mild atherosclerotic plaque. Patent without evidence of aneurysm, dissection, vasculitis or significant stenosis. Veins: No obvious venous abnormality within the limitations of this arterial phase study. Review of the MIP images confirms the above findings. NON-VASCULAR Hepatobiliary: No focal liver abnormality. No gallstones, gallbladder wall thickening, or pericholecystic fluid. No biliary dilatation. Pancreas: No focal lesion. Normal pancreatic contour. No surrounding inflammatory changes. No main pancreatic ductal dilatation. Spleen: Normal in size without focal abnormality. Adrenals/Urinary Tract: No adrenal nodule bilaterally. Bilateral kidneys enhance symmetrically. Couple of 5 cm right renal fluid density lesion with associated peripheral calcifications measuring up to 1 mm. No hydronephrosis. No hydroureter. The urinary bladder is unremarkable. Stomach/Bowel: Stomach is within normal  limits. No evidence of bowel wall thickening or dilatation. Appendix appears normal. Lymphatic: Prominent bilateral but nonenlarged inguinal lymph nodes. No lymphadenopathy. Reproductive: Prostate is unremarkable. Other: No intraperitoneal free fluid. No intraperitoneal free gas. No organized fluid collection. Musculoskeletal: No abdominal wall hernia or abnormality. Right inferior midline gluteal subcutaneus soft tissue edema (6:395). No suspicious lytic or blastic osseous lesions. No acute displaced fracture. Multilevel degenerative changes of the spine. Review of the MIP images confirms the above findings. IMPRESSION: 1. Diffuse bronchial wall thickening with peribronchovascular ground-glass and consolidative airspace opacities most prominent along the bilateral lower lobes and right apex. Finding may represent infection/inflammation versus alveolar hemorrhage. COVID-19 infection not excluded. Recommend CT follow-up in 3 months to evaluate for complete resolution. 2. Trace bilateral pleural effusions. 3. Bilateral hilar lymphadenopathy likely reactive in etiology. Recommend attention on follow-up. 4. Cardiomegaly. 5. No acute thoracic or abdominal aorta abnormality. 6. No pulmonary embolus. 7. Multiple lesions, including right Bosniak II benign renal cyst measuring 5 cm. No follow-up imaging is recommended. JACR 2018 Feb; 264-273, Management of the Incidental Renal Mass on CT, [employer] 2021; 814-848, Bosniak Classification of Cystic Renal Masses, Version 2019. 8. Right inferior midline gluteal subcutaneus soft tissue edema. Correlate with physical  exam. Electronically Signed   By: Morgane  Naveau M.D.   On: 03/27/2024 21:59   DG Chest Portable 1 View Result Date: 03/27/2024 CLINICAL DATA:  Cough EXAM: PORTABLE CHEST 1 VIEW COMPARISON:  Chest x-ray 02/06/2024 FINDINGS: The heart is mildly enlarged. The lungs are clear. There is no pleural effusion or pneumothorax. No acute fractures are seen. IMPRESSION:  Mild cardiomegaly. No acute pulmonary process. Electronically Signed   By: Greig Pique M.D.   On: 03/27/2024 15:55     Assessment and Plan:  Syncope  - Patient reports having 2 episodes of syncope.  One occurred while driving, the other occurred while he was getting in his car preparing to drive.  Reports having blurred vision and some lightheadedness prior to syncope.  Only had 1 prior episode of syncope when he was 18 and dehydrated.  Patient denies recent med changes.  Did have a bit of a cough yesterday, denies fever, chills, body aches.  - Echocardiogram with EF 60-65%, severe asymmetric LVH of the lateral segment - Telemetry shows predominantly normal sinus rhythm with occasional PACs - Recommend 14 day zio at DC  - Patient does report that his MS symptoms have been worsening recently. Agree that further neurologic workup would be reasonable  - I did discuss the [place] DMV medical guidelines for driving: it is prudent to recommend that all persons should be free of syncopal episodes for at least six months to be granted the driving privilege. (THE [place]  PHYSICIAN'S GUIDE TO DRIVER MEDICAL EVALUATION, Second Edition, Medical Review Branch, Associate Professor, Division of [employer], [place]  Department of Transportation, July 2004)   Chronic Diastolic Heart Failure  Hypertensive heart disease / LVH  Severe HTN  - Patient has known history of diastolic heart failure and LVH.  Cardiac MRI in 12/2022 showed increased myocardial fibrosis in setting of hypertension -Patient also has history of pericardial effusion, felt to be uremic prior to starting hemodialysis.  Was large on echo in 01/2023, improved once dialysis started - Echocardiogram this admission with EF 60-65%, no regional wall motion abnormalities, severe asymmetric LVH, normal RV systolic function, small-moderate pericardial effusion - On arrival to the ED, patient's blood pressure significantly elevated to  209/114.  This was in the setting of missed dialysis - Patient has since undergone HD.  Back on carvedilol  25 mg twice daily, amlodipine  10 mg daily, hydralazine  25 mg twice daily, olmesartan  20 mg every night - BP overall elevated, had 1 blood pressure of 107/65 this AM  - Continue to follow blood pressure.  If remains elevated, could increase hydralazine  to 25 mg 3 times daily versus 50 mg 3 times daily - Volume managed by HD   Elevated Troponin  - hsTn peaked at 243  - Denies any chest pain. Echocardiogram with EF 60-65%, no wall motion abnormalities - Suspect demand ischemia from severely elevated BP in the setting of ESRD  - No plans for ischemic evaluation at this time   ESRD on HD  - Per nephrology   Otherwise per primary  - Possible COVID-19 infection vs other URI  - Renal cyst  - Hilar lymphadenopathy  - Anemia of chronic disease  - Multiple sclerosis  - Neuropathy  - Generalized anxiety disorder  - COPD  - Constipation  - Type 2 DM  - HLD    Risk Assessment/Risk Scores:   [place]  Heart Association (NYHA) Functional Class NYHA Class I      For questions or updates, please contact  [facility] Please consult [email or web address] for contact info under    Signed, Rollo FABIENE Louder, PA-C  03/28/2024 12:17 PM  EP/Cardiology Attending  Patient seen and examined. Agree with the findings as noted above. The patient is a pleasant 48 yo man with longstanding uncontrolled HTN who has developed ESRD and on HD for over 1 year. He had a CMRI performed about15 months ago which showed non-specific LGE but no evidence for HCM, thickness around 14 mm. He has had 2 episodes of syncope while in his car. He is not sure he passed out completely. He has had an otherwise workup and tele is unrevealling. On exam he is a pleasant well appearing 48 yo man, NAD. Lungs are clear and CV with a RRR and S4. Ext with no edema. Neuro is non-focal.  A/P Unexplained syncope - MRI noted.  Labs noted. I think that if the patient does not have any significant arrhythmias overnight he can be discharged with a 14 day heart monitor.   Danelle Sonna Lipsky,MD

## 2024-03-28 NOTE — ED Provider Notes (Signed)
.  Critical Care  Performed by: Cottie Donnice PARAS, MD Authorized by: Cottie Donnice PARAS, MD   Critical care provider statement:    Critical care time (minutes):  30   Critical care time was exclusive of:  Separately billable procedures and treating other patients   Critical care was necessary to treat or prevent imminent or life-threatening deterioration of the following conditions:  Circulatory failure   Critical care was time spent personally by me on the following activities:  Ordering and performing treatments and interventions, ordering and review of laboratory studies, ordering and review of radiographic studies, pulse oximetry, review of old charts, examination of patient and evaluation of patient's response to treatment Comments:     BP hypertensive urgency control with medications     Cottie Donnice PARAS, MD 03/28/24 1435

## 2024-03-28 NOTE — Plan of Care (Signed)

## 2024-03-28 NOTE — Plan of Care (Signed)
  Problem: Clinical Measurements: Goal: Respiratory complications will improve Outcome: Progressing Goal: Cardiovascular complication will be avoided Outcome: Progressing   Problem: Activity: Goal: Risk for activity intolerance will decrease Outcome: Progressing   Problem: Safety: Goal: Ability to remain free from injury will improve Outcome: Progressing   Problem: Cardiac: Goal: Will achieve and/or maintain adequate cardiac output Outcome: Progressing

## 2024-03-28 NOTE — Care Management Obs Status (Signed)
 MEDICARE OBSERVATION STATUS NOTIFICATION   Patient Details  Name: George Day MRN: [id number] Date of Birth: 1976-01-09   Medicare Observation Status Notification Given:  Yes    Jon Cruel 03/28/2024, 10:26 AM

## 2024-03-28 NOTE — Progress Notes (Signed)
   03/28/24 0445  Vitals  BP (!) 148/88  BP Location Right Arm  BP Method Automatic  Patient Position (if appropriate) Lying  Pulse Rate 79  ECG Heart Rate 78  Oxygen Therapy  SpO2 96 %  During Treatment Monitoring  Arterial Pressure (mmHg) -0.61 mmHg  Venous Pressure (mmHg) -0.8 mmHg  TMP (mmHg) -49.09 mmHg  Ultrafiltration Rate (mL/min) 751 mL/min  Dialysate Flow Rate (mL/min) 299 ml/min  Dialysate Potassium Concentration 3  Dialysate Calcium  Concentration 2.5  Duration of HD Treatment -hour(s) 3.19 hour(s)  Cumulative Fluid Removed (mL) per Treatment  3366.66  HD Safety Checks Performed Yes  Intra-Hemodialysis Comments Tx completed (Due to cramping pt request to end treatment.)  Post Treatment  Dialyzer Clearance Lightly streaked  Liters Processed 66.9  Fluid Removed (mL) 3400 mL  Post-Hemodialysis Comments  (Pt request to D/C treatment 20 minutes due to severe left leg cramping.)  AVG/AVF Arterial Site Held (minutes) 7 minutes  AVG/AVF Venous Site Held (minutes) 7 minutes  Fistula / Graft Left Upper arm Arteriovenous vein graft  Placement Date/Time: 04/24/23 1410   Orientation: Left  Access Location: Upper arm  Access Type: Arteriovenous vein graft  Site Condition No complications  Fistula / Graft Assessment Present;Thrill;Bruit  Status Deaccessed  Needle Size 15  Drainage Description None
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
## 2024-03-28 NOTE — Progress Notes (Signed)
 Admit: 03/27/2024 LOS: 0  67M ESRD MWF iHD with syncope x2  Subjective:  HD overnight 3.4L UF,  BPs improved from admit, remain elevated, back on outpt PO meds No further syncope or vision changes, feels well  Notes significant anxiety at time of the events preceding/leading to admission No CP, SOB  08/08 0701 - 08/09 0700 In: 386.3 [P.O.:240; IV Piggyback:146.3] Out: 3650 [Urine:250]  Filed Weights   03/27/24 1444 03/27/24 2159  Weight: 90.7 kg 92.7 kg    Scheduled Meds:  amLODipine   10 mg Oral Daily   atorvastatin   40 mg Oral QHS   budesonide -glycopyrrolate -formoterol   2 puff Inhalation BID   carvedilol   25 mg Oral BID   DULoxetine   30 mg Oral QPM   famotidine   10 mg Oral Daily   gabapentin   100 mg Oral Q M,W,F-2000   heparin   5,000 Units Subcutaneous Q8H   hydrALAZINE   50 mg Oral BID   irbesartan   150 mg Oral Daily   sodium chloride  flush  3 mL Intravenous Q12H   Continuous Infusions:  [START ON 03/30/2024] levofloxacin  (LEVAQUIN ) IV     PRN Meds:.acetaminophen  **OR** acetaminophen , ipratropium, mouth rinse, senna-docusate  Current Labs: reviewed   Physical Exam:  Blood pressure (!) 166/95, pulse 90, temperature 98.3 F (36.8 C), temperature source Oral, resp. rate 20, height 5' 8 (1.727 m), weight 92.7 kg, SpO2 94%. GEN: NAD, conversant,sitting in chair ENT: ENT EYES: EOMI CV: Regular, normal S1 and S2, no rub PULM: Clear bilaterally, normal work of breathing ABD: Soft, nontender SKIN: No rashes or lesions EXT: No significant lower extremity edema VASCULAR: Left upper arm AV graft with bruit and thrill, no issues NEURO: Nonfocal, CN II through XII grossly intact  A Syncope: Workup per primary, nonfocal at this time.  Occurred before dialysis.  Severe hypertension common prior to dialysis for him. 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 Morphine  should not be used.  Baclofen should be avoided Avoid oral sodium phosphate  and magnesium citrate based laxatives / bowel preps    Bernardino Gasman MD 03/28/2024, 8:38 AM  Recent Labs  Lab 03/27/24 1521 03/27/24 1623 03/28/24 0104  NA 140 141 135  K 3.9 3.8 3.4*  CL 106 107 107  CO2 21*  --  16*  GLUCOSE 95 91 111*  BUN 63* 59* 57*  CREATININE 7.36* 7.80* 6.74*  CALCIUM  9.2  --  8.9  PHOS 3.0  --   --    Recent Labs  Lab 03/27/24 1521 03/27/24 1623 03/28/24 0104  WBC 7.7  --  8.8  NEUTROABS 5.6  --   --   HGB 11.2* 12.9* 10.5*  HCT 34.7* 38.0* 31.5*  MCV 77.6*  --  75.2*  PLT 231  --  214

## 2024-03-28 NOTE — Progress Notes (Signed)
 HD#0 SUBJECTIVE:  Patient Summary: George Day is a 48 y.o. with a pertinent PMH of ESRD on HD, MS, HFpEF, COPD, and HTN, who presented after two syncopal episodes and admitted for syncope work up.   Overnight Events: No acute events overnight  Interim History: Pt seen and examined at the bedside this morning. He reports that he is feeling much better overall and that after he received medicine for anxiety, he began breathing much easier. He denies any more syncopal or pre-syncopal episodes. All questions and concerns were addressed at this time.   OBJECTIVE:  Vital Signs: Vitals:   03/28/24 0445 03/28/24 0500 03/28/24 0826 03/28/24 0829  BP: (!) 148/88 (!) 148/85  (!) 166/95  Pulse: 79 80  90  Resp:  18  20  Temp:  98.8 F (37.1 C)  98.3 F (36.8 C)  TempSrc:  Oral  Oral  SpO2: 96% 96% 99% 94%  Weight:      Height:       Supplemental O2: Room Air SpO2: 94 % O2 Flow Rate (L/min): 2 L/min  Filed Weights   03/27/24 1444 03/27/24 2159  Weight: 90.7 kg 92.7 kg     Intake/Output Summary (Last 24 hours) at 03/28/2024 1021 Last data filed at 03/28/2024 9167 Gross per 24 hour  Intake 626.25 ml  Output 3650 ml  Net -3023.75 ml   Net IO Since Admission: -3,023.75 mL [03/28/24 1021]  Physical Exam: Const:: Awake, alert in NAD HENT: Normocephalic, atraumatic, mucus membranes moist Card: RRR, No MRG, No pitting edema on LE's bilaterally  Resp: LCTAB, no increased work of breathing Abd: Soft, NTND, Bsx4   Patient Lines/Drains/Airways Status     Active Line/Drains/Airways     Name Placement date Placement time Site Days   Peripheral IV 03/27/24 20 G Right Antecubital 03/27/24  2129  Antecubital  1   Fistula / Graft Left Upper arm Arteriovenous vein graft 04/24/23  1410  Upper arm  339   Peripheral IV (Ped) 03/27/24 18 G Antecubital 03/27/24  1621  -- 1            Pertinent labs and imaging:      Latest Ref Rng & Units 03/28/2024    1:04 AM 03/27/2024    4:23 PM  03/27/2024    3:21 PM  CBC  WBC 4.0 - 10.5 K/uL 8.8   7.7   Hemoglobin 13.0 - 17.0 g/dL 89.4  87.0  88.7   Hematocrit 39.0 - 52.0 % 31.5  38.0  34.7   Platelets 150 - 400 K/uL 214   231        Latest Ref Rng & Units 03/28/2024    1:04 AM 03/27/2024    4:23 PM 03/27/2024    3:21 PM  CMP  Glucose 70 - 99 mg/dL 888  91  95   BUN 6 - 20 mg/dL 57  59  63   Creatinine 0.61 - 1.24 mg/dL 3.25  2.19  2.63   Sodium 135 - 145 mmol/L 135  141  140   Potassium 3.5 - 5.1 mmol/L 3.4  3.8  3.9   Chloride 98 - 111 mmol/L 107  107  106   CO2 22 - 32 mmol/L 16   21   Calcium  8.9 - 10.3 mg/dL 8.9   9.2   Total Protein 6.5 - 8.1 g/dL   7.9   Total Bilirubin 0.0 - 1.2 mg/dL   1.3   Alkaline Phos 38 - 126 U/L  84   AST 15 - 41 U/L   37   ALT 0 - 44 U/L   35     ECHOCARDIOGRAM COMPLETE Result Date: 03/28/2024    ECHOCARDIOGRAM REPORT   Patient Name:   George Day Date of Exam: 03/28/2024 Medical Rec #:  [id number]      Height:       68.0 in Accession #:    [id number]     Weight:       204.4 lb Date of Birth:  26-Feb-1976       BSA:          2.063 m Patient Age:    48 years       BP:           171/93 mmHg Patient Gender: M              HR:           94 bpm. Exam Location:  Inpatient Procedure: 2D Echo, 3D Echo, Cardiac Doppler, Color Doppler and Strain Analysis            (Both Spectral and Color Flow Doppler were utilized during            procedure). STAT ECHO Indications:    Pericardial Effusion  History:        Patient has prior history of Echocardiogram examinations. CHF,                 CAD, COPD, Signs/Symptoms:Dyspnea; Risk Factors:Hypertension,                 Diabetes and Dyslipidemia. CKD Stage 4.  Sonographer:    Philomena Daring Referring Phys: ELNORA IP IMPRESSIONS  1. Left ventricular ejection fraction, by estimation, is 60 to 65%. The left ventricle has normal function. The left ventricle has no regional wall motion abnormalities. There is severe asymmetric left ventricular hypertrophy of the  lateral segment. Left ventricular diastolic parameters are indeterminate. The average left ventricular global longitudinal strain is -10.7 %.  2. Right ventricular systolic function is normal. The right ventricular size is normal. Tricuspid regurgitation signal is inadequate for assessing PA pressure.  3. Small to moderate circumferential pericardial effusion. Moderate effusion along RA. There is no evidence of cardiac tamponade.  4. The mitral valve is normal in structure. Trivial mitral valve regurgitation. No evidence of mitral stenosis.  5. The aortic valve has an indeterminant number of cusps. Aortic valve regurgitation is not visualized. No aortic stenosis is present. Aortic valve mean gradient measures 7.0 mmHg.  6. The inferior vena cava is normal in size with greater than 50% respiratory variability, suggesting right atrial pressure of 3 mmHg. Comparison(s): Changes from prior study are noted. Pericardial effusion was previously large circumferential. FINDINGS  Left Ventricle: Left ventricular ejection fraction, by estimation, is 60 to 65%. The left ventricle has normal function. The left ventricle has no regional wall motion abnormalities. The average left ventricular global longitudinal strain is -10.7 %. Strain was performed and the global longitudinal strain is indeterminate. The left ventricular internal cavity size was normal in size. There is severe asymmetric left ventricular hypertrophy of the lateral segment. Left ventricular diastolic parameters are indeterminate. Right Ventricle: The right ventricular size is normal. No increase in right ventricular wall thickness. Right ventricular systolic function is normal. Tricuspid regurgitation signal is inadequate for assessing PA pressure. Left Atrium: Left atrial size was normal in size. Right Atrium: Right atrial size was normal in size. Pericardium: Small to moderate circumferential  pericardial effusion. Moderate effusion along RA. A moderately sized  pericardial effusion is present. The pericardial effusion is circumferential. There is no evidence of cardiac tamponade. Mitral Valve: The mitral valve is normal in structure. Trivial mitral valve regurgitation. No evidence of mitral valve stenosis. Tricuspid Valve: The tricuspid valve is normal in structure. Tricuspid valve regurgitation is not demonstrated. No evidence of tricuspid stenosis. Aortic Valve: The aortic valve has an indeterminant number of cusps. Aortic valve regurgitation is not visualized. No aortic stenosis is present. Aortic valve mean gradient measures 7.0 mmHg. Aortic valve peak gradient measures 13.1 mmHg. Aortic valve area, by VTI measures 2.17 cm. Pulmonic Valve: The pulmonic valve was normal in structure. Pulmonic valve regurgitation is trivial. No evidence of pulmonic stenosis. Aorta: The aortic root and ascending aorta are structurally normal, with no evidence of dilitation. Venous: The inferior vena cava is normal in size with greater than 50% respiratory variability, suggesting right atrial pressure of 3 mmHg. IAS/Shunts: No atrial level shunt detected by color flow Doppler. Additional Comments: 3D was performed not requiring image post processing on an independent workstation and was indeterminate.  LEFT VENTRICLE PLAX 2D LVIDd:         4.60 cm      Diastology LVIDs:         3.20 cm      LV e' medial:    6.74 cm/s LV PW:         1.40 cm      LV E/e' medial:  17.2 LV IVS:        1.40 cm      LV e' lateral:   8.92 cm/s LVOT diam:     2.00 cm      LV E/e' lateral: 13.0 LV SV:         68 LV SV Index:   33           2D Longitudinal Strain LVOT Area:     3.14 cm     2D Strain GLS Avg:     -10.7 %  LV Volumes (MOD) LV vol d, MOD A2C: 91.0 ml LV vol d, MOD A4C: 153.0 ml LV vol s, MOD A2C: 23.5 ml LV vol s, MOD A4C: 38.6 ml LV SV MOD A2C:     67.5 ml LV SV MOD A4C:     153.0 ml LV SV MOD BP:      86.9 ml RIGHT VENTRICLE             IVC RV S prime:     16.00 cm/s  IVC diam: 1.60 cm TAPSE  (M-mode): 3.2 cm LEFT ATRIUM             Index        RIGHT ATRIUM           Index LA diam:        4.00 cm 1.94 cm/m   RA Area:     17.60 cm LA Vol (A2C):   55.1 ml 26.71 ml/m  RA Volume:   43.60 ml  21.14 ml/m LA Vol (A4C):   58.5 ml 28.36 ml/m LA Biplane Vol: 61.2 ml 29.67 ml/m  AORTIC VALVE AV Area (Vmax):    2.27 cm AV Area (Vmean):   2.18 cm AV Area (VTI):     2.17 cm AV Vmax:           181.00 cm/s AV Vmean:          123.000 cm/s AV VTI:  0.313 m AV Peak Grad:      13.1 mmHg AV Mean Grad:      7.0 mmHg LVOT Vmax:         131.00 cm/s LVOT Vmean:        85.500 cm/s LVOT VTI:          0.216 m LVOT/AV VTI ratio: 0.69  AORTA Ao Root diam: 2.50 cm Ao Asc diam:  3.30 cm MITRAL VALVE MV Area (PHT): 4.21 cm     SHUNTS MV Decel Time: 180 msec     Systemic VTI:  0.22 m MV E velocity: 116.00 cm/s  Systemic Diam: 2.00 cm MV A velocity: 84.00 cm/s MV E/A ratio:  1.38 Vishnu Priya Mallipeddi Electronically signed by Diannah Late Mallipeddi Signature Date/Time: 03/28/2024/8:40:30 AM    Final    CT Angio Chest/Abd/Pel for Dissection W and/or Wo Contrast Result Date: 03/27/2024 CLINICAL DATA:  Acute aortic syndrome (AAS) suspected.  Dizziness EXAM: CT ANGIOGRAPHY CHEST, ABDOMEN AND PELVIS TECHNIQUE: Non-contrast CT of the chest was initially obtained. Multidetector CT imaging through the chest, abdomen and pelvis was performed using the standard protocol during bolus administration of intravenous contrast. Multiplanar reconstructed images and MIPs were obtained and reviewed to evaluate the vascular anatomy. RADIATION DOSE REDUCTION: This exam was performed according to the departmental dose-optimization program which includes automated exposure control, adjustment of the mA and/or kV according to patient size and/or use of iterative reconstruction technique. CONTRAST:  OMNIPAQUE  IOHEXOL  350 MG/ML SOLN COMPARISON:  Chest x-ray 03/27/2024, CT abdomen pelvis 02/06/2024 FINDINGS: CTA CHEST FINDINGS  Cardiovascular: Satisfactory opacification of the pulmonary arteries to the segmental level. No evidence of pulmonary embolism. Enlarged heart size. No significant pericardial effusion. The thoracic aorta is normal in caliber. No thoracic aorta dissection. No atherosclerotic plaque of the thoracic aorta. No coronary artery calcifications. Mediastinum/Nodes: Right hilar lymphadenopathy measuring up to 1.7 cm. Left hilar lymphadenopathy measuring up to 1 cm. No enlarged mediastinal or axillary lymph nodes. Thyroid gland, trachea, and esophagus demonstrate no significant findings. Lungs/Pleura: Diffuse bronchial wall thickening with peribronchovascular ground-glass and consolidative airspace opacities most prominent along the bilateral lower lobes and right apex (7:28). No pulmonary nodule. No pulmonary mass. Trace bilateral pleural effusion. No pneumothorax. Musculoskeletal: No chest wall abnormality. No suspicious lytic or blastic osseous lesions. No acute displaced fracture. Review of the MIP images confirms the above findings. CTA ABDOMEN AND PELVIS FINDINGS VASCULAR Aorta: Normal caliber aorta without aneurysm, dissection, vasculitis or significant stenosis. Celiac: Patent without evidence of aneurysm, dissection, vasculitis or significant stenosis. SMA: Patent without evidence of aneurysm, dissection, vasculitis or significant stenosis. Renals: Both renal arteries are patent without evidence of aneurysm, dissection, vasculitis, fibromuscular dysplasia or significant stenosis. IMA: Patent without evidence of aneurysm, dissection, vasculitis or significant stenosis. Inflow: Mild atherosclerotic plaque. Patent without evidence of aneurysm, dissection, vasculitis or significant stenosis. Veins: No obvious venous abnormality within the limitations of this arterial phase study. Review of the MIP images confirms the above findings. NON-VASCULAR Hepatobiliary: No focal liver abnormality. No gallstones, gallbladder wall  thickening, or pericholecystic fluid. No biliary dilatation. Pancreas: No focal lesion. Normal pancreatic contour. No surrounding inflammatory changes. No main pancreatic ductal dilatation. Spleen: Normal in size without focal abnormality. Adrenals/Urinary Tract: No adrenal nodule bilaterally. Bilateral kidneys enhance symmetrically. Couple of 5 cm right renal fluid density lesion with associated peripheral calcifications measuring up to 1 mm. No hydronephrosis. No hydroureter. The urinary bladder is unremarkable. Stomach/Bowel: Stomach is within normal limits. No evidence of bowel wall  thickening or dilatation. Appendix appears normal. Lymphatic: Prominent bilateral but nonenlarged inguinal lymph nodes. No lymphadenopathy. Reproductive: Prostate is unremarkable. Other: No intraperitoneal free fluid. No intraperitoneal free gas. No organized fluid collection. Musculoskeletal: No abdominal wall hernia or abnormality. Right inferior midline gluteal subcutaneus soft tissue edema (6:395). No suspicious lytic or blastic osseous lesions. No acute displaced fracture. Multilevel degenerative changes of the spine. Review of the MIP images confirms the above findings. IMPRESSION: 1. Diffuse bronchial wall thickening with peribronchovascular ground-glass and consolidative airspace opacities most prominent along the bilateral lower lobes and right apex. Finding may represent infection/inflammation versus alveolar hemorrhage. COVID-19 infection not excluded. Recommend CT follow-up in 3 months to evaluate for complete resolution. 2. Trace bilateral pleural effusions. 3. Bilateral hilar lymphadenopathy likely reactive in etiology. Recommend attention on follow-up. 4. Cardiomegaly. 5. No acute thoracic or abdominal aorta abnormality. 6. No pulmonary embolus. 7. Multiple lesions, including right Bosniak II benign renal cyst measuring 5 cm. No follow-up imaging is recommended. JACR 2018 Feb; 264-273, Management of the Incidental  Renal Mass on CT, [employer] 2021; 814-848, Bosniak Classification of Cystic Renal Masses, Version 2019. 8. Right inferior midline gluteal subcutaneus soft tissue edema. Correlate with physical exam. Electronically Signed   By: Morgane  Naveau M.D.   On: 03/27/2024 21:59   DG Chest Portable 1 View Result Date: 03/27/2024 CLINICAL DATA:  Cough EXAM: PORTABLE CHEST 1 VIEW COMPARISON:  Chest x-ray 02/06/2024 FINDINGS: The heart is mildly enlarged. The lungs are clear. There is no pleural effusion or pneumothorax. No acute fractures are seen. IMPRESSION: Mild cardiomegaly. No acute pulmonary process. Electronically Signed   By: Greig Pique M.D.   On: 03/27/2024 15:55    ASSESSMENT/PLAN:  Assessment: Principal Problem:   Syncope Active Problems:   GAD (generalized anxiety disorder)   ESRD (end stage renal disease) on dialysis (HCC)   Severe symptomatic hypertension   Pericardial effusion   Chronic diastolic (congestive) heart failure (HCC)   COPD (chronic obstructive pulmonary disease) (HCC)   Multiple sclerosis (HCC)   Peripheral neuropathy   Acute dyspnea   Hilar lymphadenopathy   Plan: #Syncope Pt experienced two syncopal episodes yesterday without a prodome.  -EKG showed NSR without signs of ischemia. Prolonged QTc at 493. -Upon review of telemetry, the pt had possible PAC's with beats negative depolarization.  -TTE showed EF of 60-65% with severe asymmetrical left ventricular hypertrophy of the lateral segment.  -Will reach out to cardiology this morning.  -If cardiology deems noncardiogenic syncope, will consider new MRI of brain/c-spine for demyelinating lesions causing dysautonomia.   #Dyspnea -Pt complained of dyspnea on admission, without hypoxia.  -CT on admission showed diffuse bronchial wall thickening with peribronchovascular ground-glass and consolidative airspace opacities in bilateral lower lobes and right apex concerning for infection/inflammation vs. alveolar  hemorrhage  - Will continue coverage for CAP with levoquin x5days -Pt did report improvement of respiratory symptoms after taking his axiety medication (Versed ) yesterday evening.   #Multiple Sclerosis -Pt with longstanding history of multiple sclerosis. Lat NRI in March 2025 with remote age demyelinating plaques and spinal cord hyperintensitites at C5 and T1-T2. Of note, the pt reports that when he was diagnosed, he felt similar symptoms as he did yesterday.  -Neurology consulted in the ED and signed off after ruling out acute MS flare.  -If noncardiogenic syncope, will reach back out to neurology regarding new MRI and dysautonomia.   #Hypertension -Pt with hypertensive urgency upon arrival to the ED. Much improved after dialysis last night with BP  this AM 166/95. Suspect hypertension was in the setting of volume overload after missed dialysis session.  -Continue regimen of amlodipine  10, hydralazine  50 BID, and irbesartan  150 Daily, and carvedilol  25mg  BID -HD MWF  #ESRD #Anemia of ESRD -Nephrology following. HD MWF -Hgb 10.5 this morning.  -Daily CBC's  #COPD #Hilar lymphadenopathy -Currently without exacerbation -incidental hilar lymphadenopathy found yesterday on CT. Likely inflammatory, however possible malignancy due to cigarette usage in the past. Recommend follow up scans in 3 months.   #GAD Suspect pt's dyspnea yesterday was a result of anxiety as he reports the symptoms improved after administration of verse.  -Continue home duloxetine  30mg  daily  Best Practice: Diet: Renal diet IVF: Fluids: none,  VTE: heparin  injection 5,000 Units Start: 03/27/24 2200 SCDs Start: 03/27/24 2015 Code: Full  Disposition planning: Therapy Recs: pending, DME: none Family Contact: Elease Pouch, to be notified. DISPO: Anticipated discharge pending to Home pending clinical improvement.  Signature:  [name], DO [facility]  10:21 AM, 03/28/2024  On  Call pager [phone]

## 2024-03-28 NOTE — Evaluation (Signed)
 Occupational Therapy Evaluation & Discharge Patient Details Name: George Day MRN: [id number] DOB: 1976/03/21 Today's Date: 03/28/2024   History of Present Illness   Pt is a 48 y.o. male admitted 8/8 for x2 syncope episodes. Chest x-ray negative for acute changes. EKG without any arrhythmia's. PMH: MS, CHF, HTN, CKD, DM, MI, CVA     Clinical Impressions Pt admitted based on above, and was seen based on problem list below. PTA pt was living at home with his wife and children. He reports mod I with ADLs and IADLs, with prn assistance for socks and shoes. Today pt is at his functional baseline for ADLs. Pt able to complete standing grooming tasks at sink with mod I, no LOB and denies any symptoms of dizziness. Bed mobility was mod I and functional transfers are  CGA to s for safety without AD.At baseline with MS, has deficits in strength, activity tolerance, vision, bil coordination, and FMC. Pt reporting recent interest in desire to return to work. Pt would greatly benefit from Outpatient OT services to improve deficits and work towards goal of returning to work. Pt requesting  shower chair for energy conservation during showers. All education complete, no further acute OT needs, all other needs can be met in the outpatient setting. OT is signing off on this pt.        If plan is discharge home, recommend the following:   A little help with walking and/or transfers;A little help with bathing/dressing/bathroom;Assistance with cooking/housework     Functional Status Assessment   Patient has not had a recent decline in their functional status     Equipment Recommendations   Tub/shower seat     Recommendations for Other Services         Precautions/Restrictions   Precautions Precautions: Fall Recall of Precautions/Restrictions: Intact Restrictions Weight Bearing Restrictions Per Provider Order: No     Mobility Bed Mobility Overal bed mobility: Modified Independent        General bed mobility comments: No assist, HOB minimally elevated    Transfers Overall transfer level: Needs assistance Equipment used: None Transfers: Sit to/from Stand, Bed to chair/wheelchair/BSC Sit to Stand: Contact guard assist     Step pivot transfers: Contact guard assist     General transfer comment: CGA for balance with no AD, hallway mobility ~135ft      Balance Overall balance assessment: Mild deficits observed, not formally tested       ADL either performed or assessed with clinical judgement   ADL Overall ADL's : At baseline;Modified independent     General ADL Comments: Pt at baseline of mod I for ADLs, wife prn assists with socks and shoes at home     Vision Patient Visual Report: No change from baseline Additional Comments: Pt with hx of MS occassionally has blurred vision, but denies any during the session            Pertinent Vitals/Pain Pain Assessment Pain Assessment: No/denies pain     Extremity/Trunk Assessment Upper Extremity Assessment Upper Extremity Assessment: Generalized weakness (At baseline, pt reporting decreased strength, sensation, and FMC)   Lower Extremity Assessment Lower Extremity Assessment: Defer to PT evaluation   Cervical / Trunk Assessment Cervical / Trunk Assessment: Normal   Communication Communication Communication: No apparent difficulties   Cognition Arousal: Alert Behavior During Therapy: WFL for tasks assessed/performed Cognition: No apparent impairments       Following commands: Intact       Cueing  General Comments   Cueing Techniques:  Verbal cues  Pt negative and asymptomatic for orthostatics           Home Living Family/patient expects to be discharged to:: Private residence Living Arrangements: Spouse/significant other;Children Available Help at Discharge: Available PRN/intermittently Type of Home: House Home Access: Stairs to enter [employer] of Steps: 1 Entrance  Stairs-Rails: None Home Layout: Multi-level;1/2 bath on main level Alternate Level Stairs-Number of Steps: flight Alternate Level Stairs-Rails: Left;Right Bathroom Shower/Tub: Chief Strategy Officer: Standard Bathroom Accessibility: Yes How Accessible: Accessible via walker Home Equipment: None          Prior Functioning/Environment Prior Level of Function : Independent/Modified Independent;Driving     Mobility Comments: No AD. Pt reports recenetly completing outpatient PT, and thinking of returning ADLs Comments: Prn assistance for socks and shoes, not working    OT Problem List: Cardiopulmonary status limiting activity;Impaired balance (sitting and/or standing);Decreased activity tolerance;Decreased coordination        OT Goals(Current goals can be found in the care plan section)   Acute Rehab OT Goals Patient Stated Goal: To go home OT Goal Formulation: With patient Time For Goal Achievement: 04/11/24 Potential to Achieve Goals: Good   AM-PAC OT 6 Clicks Daily Activity     Outcome Measure Help from another person eating meals?: None Help from another person taking care of personal grooming?: None Help from another person toileting, which includes using toliet, bedpan, or urinal?: None Help from another person bathing (including washing, rinsing, drying)?: None Help from another person to put on and taking off regular upper body clothing?: None Help from another person to put on and taking off regular lower body clothing?: None 6 Click Score: 24   End of Session Equipment Utilized During Treatment: Gait belt Nurse Communication: Mobility status  Activity Tolerance: Patient tolerated treatment well Patient left: in bed;with call bell/phone within reach  OT Visit Diagnosis: Unsteadiness on feet (R26.81);Other abnormalities of gait and mobility (R26.89)                Time: 9071-9050 OT Time Calculation (min): 21 min Charges:  OT General Charges $OT  Visit: 1 Visit OT Evaluation $OT Eval Moderate Complexity: 1 Mod  Adrianne BROCKS, OT  Acute Rehabilitation Services Office [phone] Secure chat preferred   Adrianne GORMAN Savers 03/28/2024, 10:22 AM

## 2024-03-28 NOTE — Evaluation (Signed)
 Physical Therapy Brief Evaluation and Discharge Note Patient Details Name: George Day MRN: [id number] DOB: [birth year] Today's Date: 03/28/2024   History of Present Illness  Pt is a 48 y.o. male admitted 8/8 for x2 syncope episodes. Chest x-ray negative for acute changes. EKG without any arrhythmia's. PMH: MS, CHF, HTN, CKD, DM, MI, CVA  Clinical Impression  Patient reports living with his family in 2 level home with full flight of stairs to bedroom. Reports he is currently functioning at his baseline mobility however does not have his R AFO present. Completes bed mobility and transfers independently but requires CGA for ambulation secondary to chronic R LE weakness leading to instability. Patient also declined trial of an assistive device. He does report interest in restarting outpatient PT for strengthening. Patient has no further acute PT needs at this time. Recommend home with PRN assist and OPPT.       PT Assessment Patient does not need any further PT services  Assistance Needed at Discharge  PRN    Equipment Recommendations Other (comment) (PT recommends use of AD however patient declined to use during evaluation.)  Recommendations for Other Services       Precautions/Restrictions Precautions Precautions: Fall Precaution/Restrictions Comments: R hemibody weakness (Chronic), does not have AFO present. Restrictions Weight Bearing Restrictions Per Provider Order: No        Mobility  Bed Mobility Rolling: Independent Supine/Sidelying to sit: Independent Sit to supine/sidelying: Independent    Transfers Overall transfer level: Independent Equipment used: None Transfers: Sit to/from Stand Sit to Stand: Independent                Ambulation/Gait Ambulation/Gait assistance: Contact guard assist Gait Distance (Feet): 100 Feet Assistive device: None     General Gait Details: Patient declined use of AD. Reports he typically wears R AFO however does not have it  here at hospital. Demosntrates reduced weight acceptance R LE, R foot drop, maintained R knee flexion, and moderate unsteadiness however no formal LOB or need for external assistance.  Home Activity Instructions    Stairs            Modified Rankin (Stroke Patients Only)        Balance Overall balance assessment: Needs assistance   Sitting balance-Leahy Scale: Good     Standing balance support: No upper extremity supported Standing balance-Leahy Scale: Fair Standing balance comment: Impaired stability with weight shifting for gait. PT recommending use of AD however patient declined.          Pertinent Vitals/Pain PT - Brief Vital Signs All Vital Signs Stable: Yes (WFL) Pain Assessment Pain Assessment: No/denies pain     Home Living Family/patient expects to be discharged to:: Private residence Living Arrangements: Spouse/significant other;Children Available Help at Discharge: Family;Available PRN/intermittently Home Environment: Stairs to enter  Stairs-Number of Steps: 1 STE and full flight of steps to bedroom Home Equipment: None        Prior Function        UE/LE Assessment        LE ROM/Strength/Tone/Coordination: Impaired LE ROM/Strength/Tone/Coordination Deficits: WFL L LE; R LE with grossly 3+/5 MMT strength at hip/knee; 2+/5 MMT strength R ankle    Communication   Communication Communication: No apparent difficulties     Cognition Overall Cognitive Status: Appears within functional limits for tasks assessed/performed       General Comments      Exercises     Assessment/Plan    PT Problem List  PT Visit Diagnosis Unsteadiness on feet (R26.81);Other abnormalities of gait and mobility (R26.89);Muscle weakness (generalized) (M62.81)    No Skilled PT Patient at baseline level of functioning (Declines use of ADs.)   Co-evaluation                AMPAC 6 Clicks Help needed turning from your back to your side while in  a flat bed without using bedrails?: None Help needed moving from lying on your back to sitting on the side of a flat bed without using bedrails?: None Help needed moving to and from a bed to a chair (including a wheelchair)?: None Help needed standing up from a chair using your arms (e.g., wheelchair or bedside chair)?: None Help needed to walk in hospital room?: A Little Help needed climbing 3-5 steps with a railing? : A Little 6 Click Score: 22      End of Session Equipment Utilized During Treatment: Gait belt Activity Tolerance: Patient tolerated treatment well Patient left: in bed;with family/visitor present;with call bell/phone within reach   PT Visit Diagnosis: Unsteadiness on feet (R26.81);Other abnormalities of gait and mobility (R26.89);Muscle weakness (generalized) (M62.81)     Time: 1416-1430 PT Time Calculation (min) (ACUTE ONLY): 14 min  Charges:   PT Evaluation $PT Eval Low Complexity: 1 Low      Sherryle Saks, PT, DPT [facility] Acute Rehabilitation Office: [phone]   Sherryle VEAR [place]  03/28/2024, 3:31 PM

## 2024-03-29 ENCOUNTER — Observation Stay (HOSPITAL_COMMUNITY)

## 2024-03-29 ENCOUNTER — Other Ambulatory Visit: Payer: Self-pay | Admitting: Cardiology

## 2024-03-29 DIAGNOSIS — M47812 Spondylosis without myelopathy or radiculopathy, cervical region: Secondary | ICD-10-CM | POA: Diagnosis not present

## 2024-03-29 DIAGNOSIS — J189 Pneumonia, unspecified organism: Secondary | ICD-10-CM

## 2024-03-29 DIAGNOSIS — M4802 Spinal stenosis, cervical region: Secondary | ICD-10-CM | POA: Diagnosis not present

## 2024-03-29 DIAGNOSIS — R55 Syncope and collapse: Secondary | ICD-10-CM | POA: Diagnosis not present

## 2024-03-29 DIAGNOSIS — G35 Multiple sclerosis: Secondary | ICD-10-CM

## 2024-03-29 DIAGNOSIS — M50222 Other cervical disc displacement at C5-C6 level: Secondary | ICD-10-CM | POA: Diagnosis not present

## 2024-03-29 DIAGNOSIS — N186 End stage renal disease: Secondary | ICD-10-CM | POA: Diagnosis not present

## 2024-03-29 DIAGNOSIS — M50221 Other cervical disc displacement at C4-C5 level: Secondary | ICD-10-CM | POA: Diagnosis not present

## 2024-03-29 DIAGNOSIS — R9082 White matter disease, unspecified: Secondary | ICD-10-CM | POA: Diagnosis not present

## 2024-03-29 DIAGNOSIS — I120 Hypertensive chronic kidney disease with stage 5 chronic kidney disease or end stage renal disease: Secondary | ICD-10-CM | POA: Diagnosis not present

## 2024-03-29 LAB — CBC
HCT: 32.5 % — ABNORMAL LOW (ref 39.0–52.0)
Hemoglobin: 10.8 g/dL — ABNORMAL LOW (ref 13.0–17.0)
MCH: 24.6 pg — ABNORMAL LOW (ref 26.0–34.0)
MCHC: 33.2 g/dL (ref 30.0–36.0)
MCV: 74 fL — ABNORMAL LOW (ref 80.0–100.0)
Platelets: 223 K/uL (ref 150–400)
RBC: 4.39 MIL/uL (ref 4.22–5.81)
RDW: 16.5 % — ABNORMAL HIGH (ref 11.5–15.5)
WBC: 5.9 K/uL (ref 4.0–10.5)
nRBC: 0 % (ref 0.0–0.2)

## 2024-03-29 LAB — RENAL FUNCTION PANEL
Albumin: 3.1 g/dL — ABNORMAL LOW (ref 3.5–5.0)
Anion gap: 12 (ref 5–15)
BUN: 49 mg/dL — ABNORMAL HIGH (ref 6–20)
CO2: 23 mmol/L (ref 22–32)
Calcium: 8.9 mg/dL (ref 8.9–10.3)
Chloride: 100 mmol/L (ref 98–111)
Creatinine, Ser: 6.88 mg/dL — ABNORMAL HIGH (ref 0.61–1.24)
GFR, Estimated: 9 mL/min — ABNORMAL LOW (ref >60)
Glucose, Bld: 101 mg/dL — ABNORMAL HIGH (ref 70–99)
Phosphorus: 4.7 mg/dL — ABNORMAL HIGH (ref 2.5–4.6)
Potassium: 3.8 mmol/L (ref 3.5–5.1)
Sodium: 135 mmol/L (ref 135–145)

## 2024-03-29 MED ORDER — GADOBUTROL 1 MMOL/ML IV SOLN
9.5000 mL | Freq: Once | INTRAVENOUS | Status: AC | PRN
  Administered 2024-03-29: 9.5 mL via INTRAVENOUS

## 2024-03-29 MED ORDER — LEVOFLOXACIN 500 MG PO TABS
500.0000 mg | ORAL_TABLET | ORAL | Status: DC
  Filled 2024-03-29: qty 1

## 2024-03-29 NOTE — Progress Notes (Signed)
 Progress Note  Patient Name: George Day Date of Encounter: 03/29/2024  Primary Cardiologist: Dorn Lesches, MD   Subjective   No chest pain or sob. Feels well.   Inpatient Medications    Scheduled Meds:  amLODipine   10 mg Oral Daily   atorvastatin   40 mg Oral QHS   budesonide -glycopyrrolate -formoterol   2 puff Inhalation BID   carvedilol   25 mg Oral BID   DULoxetine   30 mg Oral QPM   famotidine   10 mg Oral Daily   gabapentin   100 mg Oral Q M,W,F-2000   heparin   5,000 Units Subcutaneous Q8H   hydrALAZINE   50 mg Oral BID   irbesartan   150 mg Oral Daily   sodium chloride  flush  3 mL Intravenous Q12H   Continuous Infusions:  [START ON 03/30/2024] levofloxacin  (LEVAQUIN ) IV     PRN Meds: acetaminophen  **OR** acetaminophen , ipratropium, mouth rinse, senna-docusate   Vital Signs    Vitals:   03/28/24 2318 03/29/24 0435 03/29/24 0746 03/29/24 0753  BP: (!) 157/77 (!) 184/93 (!) 147/81   Pulse: 82 75 83 86  Resp: 18 18 18 17   Temp: 98.7 F (37.1 C) 98.4 F (36.9 C) 98.7 F (37.1 C)   TempSrc: Oral Oral Oral   SpO2: 98% 97% 98% 99%  Weight:      Height:        Intake/Output Summary (Last 24 hours) at 03/29/2024 1006 Last data filed at 03/28/2024 2100 Gross per 24 hour  Intake 478 ml  Output --  Net 478 ml   Filed Weights   03/27/24 1444 03/27/24 2159  Weight: 90.7 kg 92.7 kg    Telemetry    NSR - Personally Reviewed  ECG    None - Personally Reviewed  Physical Exam   GEN: No acute distress.   Neck: No JVD Cardiac: RRR, no murmurs, rubs, or gallops.  Respiratory: Clear to auscultation bilaterally. GI: Soft, nontender, non-distended  MS: No edema; No deformity. Neuro:  Nonfocal  Psych: Normal affect   Labs    Chemistry Recent Labs  Lab 03/27/24 1521 03/27/24 1623 03/28/24 0104 03/29/24 0434  NA 140 141 135 135  K 3.9 3.8 3.4* 3.8  CL 106 107 107 100  CO2 21*  --  16* 23  GLUCOSE 95 91 111* 101*  BUN 63* 59* 57* 49*  CREATININE  7.36* 7.80* 6.74* 6.88*  CALCIUM  9.2  --  8.9 8.9  PROT 7.9  --   --   --   ALBUMIN 3.8  --   --  3.1*  AST 37  --   --   --   ALT 35  --   --   --   ALKPHOS 84  --   --   --   BILITOT 1.3*  --   --   --   GFRNONAA 8*  --  9* 9*  ANIONGAP 13  --  12 12     Hematology Recent Labs  Lab 03/27/24 1521 03/27/24 1623 03/28/24 0104 03/29/24 0434  WBC 7.7  --  8.8 5.9  RBC 4.47  --  4.19* 4.39  HGB 11.2* 12.9* 10.5* 10.8*  HCT 34.7* 38.0* 31.5* 32.5*  MCV 77.6*  --  75.2* 74.0*  MCH 25.1*  --  25.1* 24.6*  MCHC 32.3  --  33.3 33.2  RDW 17.4*  --  17.0* 16.5*  PLT 231  --  214 223    Cardiac EnzymesNo results for input(s): TROPONINI in the last 168 hours.  No results for input(s): TROPIPOC in the last 168 hours.   BNPNo results for input(s): BNP, PROBNP in the last 168 hours.   DDimer No results for input(s): DDIMER in the last 168 hours.   Radiology    ECHOCARDIOGRAM COMPLETE Result Date: 03/28/2024    ECHOCARDIOGRAM REPORT   Patient Name:   George Day Date of Exam: 03/28/2024 Medical Rec #:  [id number]      Height:       68.0 in Accession #:    [id number]     Weight:       204.4 lb Date of Birth:  Oct 09, 1975       BSA:          2.063 m Patient Age:    48 years       BP:           171/93 mmHg Patient Gender: M              HR:           94 bpm. Exam Location:  Inpatient Procedure: 2D Echo, 3D Echo, Cardiac Doppler, Color Doppler and Strain Analysis            (Both Spectral and Color Flow Doppler were utilized during            procedure). STAT ECHO Indications:    Pericardial Effusion  History:        Patient has prior history of Echocardiogram examinations. CHF,                 CAD, COPD, Signs/Symptoms:Dyspnea; Risk Factors:Hypertension,                 Diabetes and Dyslipidemia. CKD Stage 4.  Sonographer:    Philomena Daring Referring Phys: ELNORA IP IMPRESSIONS  1. Left ventricular ejection fraction, by estimation, is 60 to 65%. The left ventricle has normal  function. The left ventricle has no regional wall motion abnormalities. There is severe asymmetric left ventricular hypertrophy of the lateral segment. Left ventricular diastolic parameters are indeterminate. The average left ventricular global longitudinal strain is -10.7 %.  2. Right ventricular systolic function is normal. The right ventricular size is normal. Tricuspid regurgitation signal is inadequate for assessing PA pressure.  3. Small to moderate circumferential pericardial effusion. Moderate effusion along RA. There is no evidence of cardiac tamponade.  4. The mitral valve is normal in structure. Trivial mitral valve regurgitation. No evidence of mitral stenosis.  5. The aortic valve has an indeterminant number of cusps. Aortic valve regurgitation is not visualized. No aortic stenosis is present. Aortic valve mean gradient measures 7.0 mmHg.  6. The inferior vena cava is normal in size with greater than 50% respiratory variability, suggesting right atrial pressure of 3 mmHg. Comparison(s): Changes from prior study are noted. Pericardial effusion was previously large circumferential. FINDINGS  Left Ventricle: Left ventricular ejection fraction, by estimation, is 60 to 65%. The left ventricle has normal function. The left ventricle has no regional wall motion abnormalities. The average left ventricular global longitudinal strain is -10.7 %. Strain was performed and the global longitudinal strain is indeterminate. The left ventricular internal cavity size was normal in size. There is severe asymmetric left ventricular hypertrophy of the lateral segment. Left ventricular diastolic parameters are indeterminate. Right Ventricle: The right ventricular size is normal. No increase in right ventricular wall thickness. Right ventricular systolic function is normal. Tricuspid regurgitation signal is inadequate for assessing PA pressure. Left Atrium: Left atrial size was  normal in size. Right Atrium: Right atrial size  was normal in size. Pericardium: Small to moderate circumferential pericardial effusion. Moderate effusion along RA. A moderately sized pericardial effusion is present. The pericardial effusion is circumferential. There is no evidence of cardiac tamponade. Mitral Valve: The mitral valve is normal in structure. Trivial mitral valve regurgitation. No evidence of mitral valve stenosis. Tricuspid Valve: The tricuspid valve is normal in structure. Tricuspid valve regurgitation is not demonstrated. No evidence of tricuspid stenosis. Aortic Valve: The aortic valve has an indeterminant number of cusps. Aortic valve regurgitation is not visualized. No aortic stenosis is present. Aortic valve mean gradient measures 7.0 mmHg. Aortic valve peak gradient measures 13.1 mmHg. Aortic valve area, by VTI measures 2.17 cm. Pulmonic Valve: The pulmonic valve was normal in structure. Pulmonic valve regurgitation is trivial. No evidence of pulmonic stenosis. Aorta: The aortic root and ascending aorta are structurally normal, with no evidence of dilitation. Venous: The inferior vena cava is normal in size with greater than 50% respiratory variability, suggesting right atrial pressure of 3 mmHg. IAS/Shunts: No atrial level shunt detected by color flow Doppler. Additional Comments: 3D was performed not requiring image post processing on an independent workstation and was indeterminate.  LEFT VENTRICLE PLAX 2D LVIDd:         4.60 cm      Diastology LVIDs:         3.20 cm      LV e' medial:    6.74 cm/s LV PW:         1.40 cm      LV E/e' medial:  17.2 LV IVS:        1.40 cm      LV e' lateral:   8.92 cm/s LVOT diam:     2.00 cm      LV E/e' lateral: 13.0 LV SV:         68 LV SV Index:   33           2D Longitudinal Strain LVOT Area:     3.14 cm     2D Strain GLS Avg:     -10.7 %  LV Volumes (MOD) LV vol d, MOD A2C: 91.0 ml LV vol d, MOD A4C: 153.0 ml LV vol s, MOD A2C: 23.5 ml LV vol s, MOD A4C: 38.6 ml LV SV MOD A2C:     67.5 ml LV SV MOD  A4C:     153.0 ml LV SV MOD BP:      86.9 ml RIGHT VENTRICLE             IVC RV S prime:     16.00 cm/s  IVC diam: 1.60 cm TAPSE (M-mode): 3.2 cm LEFT ATRIUM             Index        RIGHT ATRIUM           Index LA diam:        4.00 cm 1.94 cm/m   RA Area:     17.60 cm LA Vol (A2C):   55.1 ml 26.71 ml/m  RA Volume:   43.60 ml  21.14 ml/m LA Vol (A4C):   58.5 ml 28.36 ml/m LA Biplane Vol: 61.2 ml 29.67 ml/m  AORTIC VALVE AV Area (Vmax):    2.27 cm AV Area (Vmean):   2.18 cm AV Area (VTI):     2.17 cm AV Vmax:           181.00 cm/s  AV Vmean:          123.000 cm/s AV VTI:            0.313 m AV Peak Grad:      13.1 mmHg AV Mean Grad:      7.0 mmHg LVOT Vmax:         131.00 cm/s LVOT Vmean:        85.500 cm/s LVOT VTI:          0.216 m LVOT/AV VTI ratio: 0.69  AORTA Ao Root diam: 2.50 cm Ao Asc diam:  3.30 cm MITRAL VALVE MV Area (PHT): 4.21 cm     SHUNTS MV Decel Time: 180 msec     Systemic VTI:  0.22 m MV E velocity: 116.00 cm/s  Systemic Diam: 2.00 cm MV A velocity: 84.00 cm/s MV E/A ratio:  1.38 Vishnu Priya Mallipeddi Electronically signed by Diannah Late Mallipeddi Signature Date/Time: 03/28/2024/8:40:30 AM    Final    CT Angio Chest/Abd/Pel for Dissection W and/or Wo Contrast Result Date: 03/27/2024 CLINICAL DATA:  Acute aortic syndrome (AAS) suspected.  Dizziness EXAM: CT ANGIOGRAPHY CHEST, ABDOMEN AND PELVIS TECHNIQUE: Non-contrast CT of the chest was initially obtained. Multidetector CT imaging through the chest, abdomen and pelvis was performed using the standard protocol during bolus administration of intravenous contrast. Multiplanar reconstructed images and MIPs were obtained and reviewed to evaluate the vascular anatomy. RADIATION DOSE REDUCTION: This exam was performed according to the departmental dose-optimization program which includes automated exposure control, adjustment of the mA and/or kV according to patient size and/or use of iterative reconstruction technique. CONTRAST:   OMNIPAQUE  IOHEXOL  350 MG/ML SOLN COMPARISON:  Chest x-ray 03/27/2024, CT abdomen pelvis 02/06/2024 FINDINGS: CTA CHEST FINDINGS Cardiovascular: Satisfactory opacification of the pulmonary arteries to the segmental level. No evidence of pulmonary embolism. Enlarged heart size. No significant pericardial effusion. The thoracic aorta is normal in caliber. No thoracic aorta dissection. No atherosclerotic plaque of the thoracic aorta. No coronary artery calcifications. Mediastinum/Nodes: Right hilar lymphadenopathy measuring up to 1.7 cm. Left hilar lymphadenopathy measuring up to 1 cm. No enlarged mediastinal or axillary lymph nodes. Thyroid gland, trachea, and esophagus demonstrate no significant findings. Lungs/Pleura: Diffuse bronchial wall thickening with peribronchovascular ground-glass and consolidative airspace opacities most prominent along the bilateral lower lobes and right apex (7:28). No pulmonary nodule. No pulmonary mass. Trace bilateral pleural effusion. No pneumothorax. Musculoskeletal: No chest wall abnormality. No suspicious lytic or blastic osseous lesions. No acute displaced fracture. Review of the MIP images confirms the above findings. CTA ABDOMEN AND PELVIS FINDINGS VASCULAR Aorta: Normal caliber aorta without aneurysm, dissection, vasculitis or significant stenosis. Celiac: Patent without evidence of aneurysm, dissection, vasculitis or significant stenosis. SMA: Patent without evidence of aneurysm, dissection, vasculitis or significant stenosis. Renals: Both renal arteries are patent without evidence of aneurysm, dissection, vasculitis, fibromuscular dysplasia or significant stenosis. IMA: Patent without evidence of aneurysm, dissection, vasculitis or significant stenosis. Inflow: Mild atherosclerotic plaque. Patent without evidence of aneurysm, dissection, vasculitis or significant stenosis. Veins: No obvious venous abnormality within the limitations of this arterial phase study. Review of the  MIP images confirms the above findings. NON-VASCULAR Hepatobiliary: No focal liver abnormality. No gallstones, gallbladder wall thickening, or pericholecystic fluid. No biliary dilatation. Pancreas: No focal lesion. Normal pancreatic contour. No surrounding inflammatory changes. No main pancreatic ductal dilatation. Spleen: Normal in size without focal abnormality. Adrenals/Urinary Tract: No adrenal nodule bilaterally. Bilateral kidneys enhance symmetrically. Couple of 5 cm right renal fluid density lesion with associated peripheral  calcifications measuring up to 1 mm. No hydronephrosis. No hydroureter. The urinary bladder is unremarkable. Stomach/Bowel: Stomach is within normal limits. No evidence of bowel wall thickening or dilatation. Appendix appears normal. Lymphatic: Prominent bilateral but nonenlarged inguinal lymph nodes. No lymphadenopathy. Reproductive: Prostate is unremarkable. Other: No intraperitoneal free fluid. No intraperitoneal free gas. No organized fluid collection. Musculoskeletal: No abdominal wall hernia or abnormality. Right inferior midline gluteal subcutaneus soft tissue edema (6:395). No suspicious lytic or blastic osseous lesions. No acute displaced fracture. Multilevel degenerative changes of the spine. Review of the MIP images confirms the above findings. IMPRESSION: 1. Diffuse bronchial wall thickening with peribronchovascular ground-glass and consolidative airspace opacities most prominent along the bilateral lower lobes and right apex. Finding may represent infection/inflammation versus alveolar hemorrhage. COVID-19 infection not excluded. Recommend CT follow-up in 3 months to evaluate for complete resolution. 2. Trace bilateral pleural effusions. 3. Bilateral hilar lymphadenopathy likely reactive in etiology. Recommend attention on follow-up. 4. Cardiomegaly. 5. No acute thoracic or abdominal aorta abnormality. 6. No pulmonary embolus. 7. Multiple lesions, including right Bosniak II  benign renal cyst measuring 5 cm. No follow-up imaging is recommended. JACR 2018 Feb; 264-273, Management of the Incidental Renal Mass on CT, [employer] 2021; 814-848, Bosniak Classification of Cystic Renal Masses, Version 2019. 8. Right inferior midline gluteal subcutaneus soft tissue edema. Correlate with physical exam. Electronically Signed   By: Morgane  Naveau M.D.   On: 03/27/2024 21:59   DG Chest Portable 1 View Result Date: 03/27/2024 CLINICAL DATA:  Cough EXAM: PORTABLE CHEST 1 VIEW COMPARISON:  Chest x-ray 02/06/2024 FINDINGS: The heart is mildly enlarged. The lungs are clear. There is no pleural effusion or pneumothorax. No acute fractures are seen. IMPRESSION: Mild cardiomegaly. No acute pulmonary process. Electronically Signed   By: Greig Pique M.D.   On: 03/27/2024 15:55    Cardiac Studies   See above  Patient Profile     48 y.o. male admitted with syncope but his history is not too good for a cardiac etiology.   Assessment & Plan    Syncope - an outpatient 14 day zio is indicated. Might consider ILR if 14 day monitor is unremarkable. LVH - his old MRI was described as non-specific and not HCM. Looks like the was thickness was 14 mm, less than needed for a HCM diagnosis. Continue anti-hypertensive meds. ESRD - he is tolerating HD. He has had some trouble with his graft and he has some residual pericardial fluid, usually a sign of suboptimal HD.  [facility] will sign off.   The patient is ready for discharge today from a cardiac standpoint. Medication Recommendations:  none Other recommendations (labs, testing, etc):  Zio monitor Follow up as an outpatient:  Dr. Court  For questions or updates, please contact [facility] Please consult [email or web address] for contact info under Cardiology/STEMI.      Signed, Danelle Birmingham, MD  03/29/2024, 10:06 AM

## 2024-03-29 NOTE — Discharge Planning (Signed)
 [facility] Patient Discharge Orders- [facility] CLINIC: [facility]  Patient's name: George Day Admit/DC Dates: 03/27/2024 - Anticipated dc date 03/29/24  Discharge Diagnoses: Syncope - occurred pre-HD; w/u here negative here thus far  Suspected AVG malfunction - see below  Aranesp : Given: No    Last Hgb: 10.8 PRBC's Given: No  ESA dose for discharge: N/A IV Iron dose at discharge: Resume weekly Venofer   Heparin  change: No  EDW Change: Yes New EDW:  Raise to 89kg  [place] Change: No  Access intervention/Change: Informed of abnormal exam per Dr. Marlee. See below  Hectorol  change: No  Discharge Labs: Calcium  8.9  Phosphorus 4.7  Albumin 3.1  K+ 3.8  IV Antibiotics: No Details:  On Coumadin?: No   OTHER/APPTS/LAB ORDERS:  Attention Nursing: Per Dr. Marlee, refer to [facility] for a shuntogram. Auscultated a whistling sound on exam today with his AVG.    D/C Meds to be reconciled by nurse after every discharge.  Completed By: Charmaine Piety, NP   Reviewed by: MD:______ RN_______

## 2024-03-29 NOTE — Progress Notes (Signed)
 Admit: 03/27/2024 LOS: 0  26M ESRD MWF iHD with syncope x2  Subjective:  No interval events Seen by cardiology, syncope workup thus far negative, plan for outpatient monitor  08/09 0701 - 08/10 0700 In: 718 [P.O.:718] Out: -   Filed Weights   03/27/24 1444 03/27/24 2159  Weight: 90.7 kg 92.7 kg    Scheduled Meds:  amLODipine   10 mg Oral Daily   atorvastatin   40 mg Oral QHS   budesonide -glycopyrrolate -formoterol   2 puff Inhalation BID   carvedilol   25 mg Oral BID   DULoxetine   30 mg Oral QPM   famotidine   10 mg Oral Daily   gabapentin   100 mg Oral Q M,W,F-2000   heparin   5,000 Units Subcutaneous Q8H   hydrALAZINE   50 mg Oral BID   irbesartan   150 mg Oral Daily   sodium chloride  flush  3 mL Intravenous Q12H   Continuous Infusions:  [START ON 03/30/2024] levofloxacin  (LEVAQUIN ) IV     PRN Meds:.acetaminophen  **OR** acetaminophen , ipratropium, mouth rinse, senna-docusate  Current Labs: reviewed   Physical Exam:  Blood pressure (!) 147/81, pulse 86, temperature 98.7 F (37.1 C), temperature source Oral, resp. rate 17, height 5' 8 (1.727 m), weight 92.7 kg, SpO2 99%. GEN: NAD, conversant,sitting in chair ENT: ENT EYES: EOMI CV: Regular, normal S1 and S2, no rub PULM: Clear bilaterally, normal work of breathing ABD: Soft, nontender SKIN: No rashes or lesions EXT: No significant lower extremity edema VASCULAR: Left upper arm AV graft with musical high-pitched bruit and thrill, no issues NEURO: Nonfocal, CN II through XII grossly intact  A Syncope: Workup per primary, nonfocal at this time.  Occurred before dialysis.  Severe hypertension common prior to dialysis for him.  Cardiology has seen, likely for outpatient monitoring.  None since admission. 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 Morphine  should not be used.  Baclofen should be avoided Avoid oral sodium phosphate  and magnesium citrate based laxatives / bowel preps    Bernardino Gasman MD 03/29/2024, 10:18 AM  Recent Labs  Lab 03/27/24 1521 03/27/24 1623 03/28/24 0104 03/29/24 0434  NA 140 141 135 135  K 3.9 3.8 3.4* 3.8  CL 106 107 107 100  CO2 21*  --  16* 23  GLUCOSE 95 91 111* 101*  BUN 63* 59* 57* 49*  CREATININE 7.36* 7.80* 6.74* 6.88*  CALCIUM  9.2  --  8.9 8.9  PHOS 3.0  --   --  4.7*   Recent Labs  Lab 03/27/24 1521 03/27/24 1623 03/28/24 0104 03/29/24 0434  WBC 7.7  --  8.8 5.9  NEUTROABS 5.6  --   --   --   HGB 11.2* 12.9* 10.5* 10.8*  HCT 34.7* 38.0* 31.5* 32.5*  MCV 77.6*  --  75.2* 74.0*  PLT 231  --  214 223

## 2024-03-29 NOTE — Plan of Care (Addendum)

## 2024-03-29 NOTE — Progress Notes (Signed)
 HD#0 SUBJECTIVE:  Patient Summary: George Day is a 48 y.o. with a pertinent PMH of ESRD on HD, MS, HFpEF, COPD, and HTN, who presented after two syncopal episodes and admitted for syncope work up.  Based on abnormal telemetry showing occasional PACs and echocardiogram showing severe asymmetric LVH there is concern for cardiac syncope.  Cardiac MRI about 15 months ago showed no evidence of hypertrophic cardiomyopathy.  Overnight Events: No acute events overnight  Interim History:  Patient is doing overall well this morning without any recurrence of his syncopal episodes.  Telemetry reviewed which shows no more of those abnormal PACs seen yesterday.  He does report frequent occurrences of numbness that starts in his right leg and seems to go proximal up to his head and then back down into his left leg and entire body.  He does not note any recent symptoms that seem associated with his previous MS flares.  OBJECTIVE:  Vital Signs: Vitals:   03/28/24 1618 03/28/24 1948 03/28/24 2318 03/29/24 0435  BP: (!) 147/75 (!) 149/83 (!) 157/77 (!) 184/93  Pulse: 92 82 82 75  Resp: 18 18 18 18   Temp: 98.6 F (37 C) 98.7 F (37.1 C) 98.7 F (37.1 C) 98.4 F (36.9 C)  TempSrc: Oral Oral Oral Oral  SpO2: 96%  98% 97%  Weight:      Height:       Supplemental O2: Room Air SpO2: 97 % O2 Flow Rate (L/min): 2 L/min  Filed Weights   03/27/24 1444 03/27/24 2159  Weight: 90.7 kg 92.7 kg     Intake/Output Summary (Last 24 hours) at 03/29/2024 0608 Last data filed at 03/28/2024 1500 Gross per 24 hour  Intake 504.25 ml  Output --  Net 504.25 ml   Net IO Since Admission: -2,905.75 mL [03/29/24 0608]  Physical Exam: Const:: Well-appearing adult male laying in bed Card: Regular rate and rhythm Resp: Clear to auscultation bilaterally, normal work of breathing on room air  Pertinent labs and imaging:      Latest Ref Rng & Units 03/29/2024    4:34 AM 03/28/2024    1:04 AM 03/27/2024    4:23 PM   CBC  WBC 4.0 - 10.5 K/uL 5.9  8.8    Hemoglobin 13.0 - 17.0 g/dL 89.1  89.4  87.0   Hematocrit 39.0 - 52.0 % 32.5  31.5  38.0   Platelets 150 - 400 K/uL 223  214         Latest Ref Rng & Units 03/29/2024    4:34 AM 03/28/2024    1:04 AM 03/27/2024    4:23 PM  CMP  Glucose 70 - 99 mg/dL 898  888  91   BUN 6 - 20 mg/dL 49  57  59   Creatinine 0.61 - 1.24 mg/dL 3.11  3.25  2.19   Sodium 135 - 145 mmol/L 135  135  141   Potassium 3.5 - 5.1 mmol/L 3.8  3.4  3.8   Chloride 98 - 111 mmol/L 100  107  107   CO2 22 - 32 mmol/L 23  16    Calcium  8.9 - 10.3 mg/dL 8.9  8.9       ASSESSMENT/PLAN:  Assessment: Principal Problem:   Syncope Active Problems:   GAD (generalized anxiety disorder)   ESRD (end stage renal disease) on dialysis (HCC)   Severe symptomatic hypertension   Pericardial effusion   Chronic diastolic (congestive) heart failure (HCC)   COPD (chronic obstructive pulmonary disease) (HCC)  Multiple sclerosis (HCC)   Peripheral neuropathy   Acute dyspnea   Hilar lymphadenopathy   Plan: #Syncope Still high suspicion for cardiac syncope with asymmetric LVH on echocardiogram.  Due to no abnormalities on telemetry overnight and recent cardiac MRI (15 months ago) cardiology prefers discharge with 14-day monitor and possible repeat cardiac MRI outpatient.  Due to his vague neurologic symptoms although has a normal neuro exam and his atypical presentation of MS in the past we discussed repeating MRI of the brain and C-spine with neurology who agrees.  If these show no significant new abnormalities we will plan for discharge later today. - Continue telemetry - Set up 14-day heart monitor  # Community-acquired pneumonia Remains stable on room air with resolution of cough and shortness of breath. - Continue levofloxacin  500 mg every 48 hours, 5-day course to complete 8/12  #Multiple Sclerosis As above we will repeat MRI of the brain and C-spine and if these show any new or  worsening enhancements we will consult neurology otherwise he will follow-up with neurology outpatient.  #Hypertension -Continue amlodipine  10 mg daily, hydralazine  50 mg twice daily, irbesartan  150 mg daily, and carvedilol  25 mg twice daily  #ESRD #Anemia of ESRD Nephrology following. HD MWF  #COPD #Hilar lymphadenopathy-follow-up in 3 months As above stable on room air without signs of COPD exacerbation.  Continue Breztri . -Currently without exacerbation -incidental hilar lymphadenopathy found yesterday on CT. Likely inflammatory, however possible malignancy due to cigarette usage in the past. Recommend follow up scans in 3 months.   #GAD  -Continue home duloxetine  30mg  daily  Best Practice: Diet: Renal diet IVF: Fluids: none,  VTE: heparin  injection 5,000 Units Start: 03/27/24 2200 SCDs Start: 03/27/24 2015 Code: Full  Disposition planning: Therapy Recs: Outpatient PT/OT Family Contact: Elease Pouch, DISPO: Anticipated discharge home today versus tomorrow pending MRI of the brain and C-spine to evaluate for worsening multiple sclerosis.  Signature: Fairy Pool, DO [facility]  6:08 AM, 03/29/2024  On Call pager [phone]

## 2024-03-30 ENCOUNTER — Inpatient Hospital Stay (HOSPITAL_COMMUNITY)
Admit: 2024-03-30 | Discharge: 2024-03-30 | Disposition: A | Discharge status: Home / Self Care | Attending: Cardiology | Admitting: Cardiology

## 2024-03-30 ENCOUNTER — Other Ambulatory Visit (HOSPITAL_COMMUNITY): Payer: Self-pay

## 2024-03-30 DIAGNOSIS — Z992 Dependence on renal dialysis: Secondary | ICD-10-CM | POA: Diagnosis not present

## 2024-03-30 DIAGNOSIS — J449 Chronic obstructive pulmonary disease, unspecified: Secondary | ICD-10-CM | POA: Diagnosis not present

## 2024-03-30 DIAGNOSIS — I132 Hypertensive heart and chronic kidney disease with heart failure and with stage 5 chronic kidney disease, or end stage renal disease: Secondary | ICD-10-CM

## 2024-03-30 DIAGNOSIS — Z87891 Personal history of nicotine dependence: Secondary | ICD-10-CM | POA: Diagnosis not present

## 2024-03-30 DIAGNOSIS — R55 Syncope and collapse: Secondary | ICD-10-CM

## 2024-03-30 DIAGNOSIS — J189 Pneumonia, unspecified organism: Secondary | ICD-10-CM | POA: Diagnosis not present

## 2024-03-30 DIAGNOSIS — I5032 Chronic diastolic (congestive) heart failure: Secondary | ICD-10-CM

## 2024-03-30 DIAGNOSIS — Z7951 Long term (current) use of inhaled steroids: Secondary | ICD-10-CM

## 2024-03-30 DIAGNOSIS — Z79899 Other long term (current) drug therapy: Secondary | ICD-10-CM | POA: Diagnosis not present

## 2024-03-30 DIAGNOSIS — N186 End stage renal disease: Secondary | ICD-10-CM | POA: Diagnosis not present

## 2024-03-30 DIAGNOSIS — G35 Multiple sclerosis: Secondary | ICD-10-CM | POA: Diagnosis not present

## 2024-03-30 DIAGNOSIS — Z792 Long term (current) use of antibiotics: Secondary | ICD-10-CM

## 2024-03-30 LAB — CBC WITH DIFFERENTIAL/PLATELET
Abs Immature Granulocytes: 0.02 K/uL (ref 0.00–0.07)
Basophils Absolute: 0 K/uL (ref 0.0–0.1)
Basophils Relative: 1 %
Eosinophils Absolute: 0.1 K/uL (ref 0.0–0.5)
Eosinophils Relative: 3 %
HCT: 31.7 % — ABNORMAL LOW (ref 39.0–52.0)
Hemoglobin: 10.7 g/dL — ABNORMAL LOW (ref 13.0–17.0)
Immature Granulocytes: 0 %
Lymphocytes Relative: 19 %
Lymphs Abs: 1 K/uL (ref 0.7–4.0)
MCH: 25.4 pg — ABNORMAL LOW (ref 26.0–34.0)
MCHC: 33.8 g/dL (ref 30.0–36.0)
MCV: 75.1 fL — ABNORMAL LOW (ref 80.0–100.0)
Monocytes Absolute: 0.6 K/uL (ref 0.1–1.0)
Monocytes Relative: 11 %
Neutro Abs: 3.7 K/uL (ref 1.7–7.7)
Neutrophils Relative %: 66 %
Platelets: 257 K/uL (ref 150–400)
RBC: 4.22 MIL/uL (ref 4.22–5.81)
RDW: 16.6 % — ABNORMAL HIGH (ref 11.5–15.5)
WBC: 5.5 K/uL (ref 4.0–10.5)
nRBC: 0 % (ref 0.0–0.2)

## 2024-03-30 LAB — BASIC METABOLIC PANEL WITH GFR
Anion gap: 12 (ref 5–15)
BUN: 62 mg/dL — ABNORMAL HIGH (ref 6–20)
CO2: 20 mmol/L — ABNORMAL LOW (ref 22–32)
Calcium: 8.8 mg/dL — ABNORMAL LOW (ref 8.9–10.3)
Chloride: 100 mmol/L (ref 98–111)
Creatinine, Ser: 7.7 mg/dL — ABNORMAL HIGH (ref 0.61–1.24)
GFR, Estimated: 8 mL/min — ABNORMAL LOW (ref >60)
Glucose, Bld: 106 mg/dL — ABNORMAL HIGH (ref 70–99)
Potassium: 3.7 mmol/L (ref 3.5–5.1)
Sodium: 132 mmol/L — ABNORMAL LOW (ref 135–145)

## 2024-03-30 MED ORDER — CARVEDILOL 25 MG PO TABS: 25.0000 mg | ORAL_TABLET | Freq: Two times a day (BID) | ORAL | Status: DC

## 2024-03-30 MED ORDER — LEVOFLOXACIN 500 MG PO TABS
500.0000 mg | ORAL_TABLET | ORAL | 0 refills | Status: AC
  Filled 2024-03-30: qty 1, 2d supply, fill #0

## 2024-03-30 NOTE — Plan of Care (Signed)
  Problem: Clinical Measurements: Goal: Respiratory complications will improve Outcome: Progressing Goal: Cardiovascular complication will be avoided Outcome: Progressing   Problem: Activity: Goal: Risk for activity intolerance will decrease Outcome: Progressing   Problem: Nutrition: Goal: Adequate nutrition will be maintained Outcome: Progressing   Problem: Coping: Goal: Level of anxiety will decrease Outcome: Progressing   Problem: Safety: Goal: Ability to remain free from injury will improve Outcome: Progressing   Problem: Cardiac: Goal: Will achieve and/or maintain adequate cardiac output Outcome: Progressing

## 2024-03-30 NOTE — TOC CM/SW Note (Signed)
 Transition of Care [facility]) - Inpatient Brief Assessment   Patient Details  Name: George Day MRN: [id number] Date of Birth: 30-Mar-1976  Transition of [facility]) CM/SW Contact:    Sudie Erminio Deems, RN Phone Number: 03/30/2024, 2:29 PM   Clinical Narrative: Patient presented for syncope. PTA patient states he is home with spouse. Patient states he has had outpatient PT at a facility off [address] in the past and feels that he will benefit from water aerobics. Case Manager did contact MD and an ambulatory referral was placed for outpatient PT and water aerobics. Patient can take Rx to any facility that provides water aerobics. If the patient is unable to use the Rx; patient is aware to contact his PCP.  Patient has transportation to appointments. No further needs identified at this time.      Transition of Care Asessment: Insurance and Status: Insurance coverage has been reviewed Patient has primary care physician: Yes Home environment has been reviewed: reviewed Prior level of function:: indpendent Prior/Current Home Services: No current home services Social Drivers of Health Review: SDOH reviewed no interventions necessary Readmission risk has been reviewed: Yes Transition of care needs: no transition of care needs at this time

## 2024-03-30 NOTE — Progress Notes (Signed)
   03/30/24 1344  Vitals  Temp 97.6 F (36.4 C)  Pulse Rate 75  Resp 12  BP (!) 167/90  SpO2 97 %  O2 Device Room Air  Weight 89.1 kg  Type of Weight Post-Dialysis  Post Treatment  Dialyzer Clearance Lightly streaked  Liters Processed 72  Fluid Removed (mL) 3000 mL  Tolerated HD Treatment Yes  AVG/AVF Arterial Site Held (minutes) 5 minutes  AVG/AVF Venous Site Held (minutes) 5 minutes   Received patient in bed to unit.  Alert and oriented.  Informed consent signed and in chart.   TX duration: 3.5  Patient tolerated well.  Transported back to the room  Alert, without acute distress.  Hand-off given to patient's nurse.   Access used: Yes Access issues: No   Total UF removed: 3000 Medication(s) given: No Meds Given Post HD VS: See Above Grid Post HD weight: 89.1 kg   [facility]

## 2024-03-30 NOTE — Progress Notes (Signed)
 [facility] Progress Note   Subjective:   Patient seen and examined in his room. He is in good spirits today. He reports that he has not had any black out episodes since being in the hospital. He is planning on being dc'd today post HD. BP remains high, but this was pre-HD. He denies any dyspnea or CP.   Objective Vitals:   03/30/24 0721 03/30/24 0829 03/30/24 0958 03/30/24 0959  BP:  (!) 152/92 (!) 184/103 (!) 184/104  Pulse: 82 97 78 78  Resp: 17 18 (!) 21 (!) 24  Temp:  98.4 F (36.9 C) 98 F (36.7 C)   TempSrc:  Oral    SpO2: 100% 97% 100% 99%  Weight:   91.9 kg   Height:       GEN: NAD, conversant, ENT: ENT EYES: EOMI CV: Regular, normal S1 and S2, no rub PULM: Clear bilaterally, normal work of breathing ABD: Soft, nontender SKIN: No rashes or lesions EXT: No significant lower extremity edema VASCULAR: Left upper arm AV graft with bruit and thrill, no issues NEURO: Nonfocal, CN II through XII grossly intact Access: AVG   Additional Objective Labs: Basic Metabolic Panel: Recent Labs  Lab 03/27/24 1521 03/27/24 1623 03/28/24 0104 03/29/24 0434  NA 140 141 135 135  K 3.9 3.8 3.4* 3.8  CL 106 107 107 100  CO2 21*  --  16* 23  GLUCOSE 95 91 111* 101*  BUN 63* 59* 57* 49*  CREATININE 7.36* 7.80* 6.74* 6.88*  CALCIUM  9.2  --  8.9 8.9  PHOS 3.0  --   --  4.7*   Liver Function Tests: Recent Labs  Lab 03/27/24 1521 03/29/24 0434  AST 37  --   ALT 35  --   ALKPHOS 84  --   BILITOT 1.3*  --   PROT 7.9  --   ALBUMIN 3.8 3.1*   No results for input(s): LIPASE, AMYLASE in the last 168 hours. CBC: Recent Labs  Lab 03/27/24 1521 03/27/24 1623 03/28/24 0104 03/29/24 0434  WBC 7.7  --  8.8 5.9  NEUTROABS 5.6  --   --   --   HGB 11.2* 12.9* 10.5* 10.8*  HCT 34.7* 38.0* 31.5* 32.5*  MCV 77.6*  --  75.2* 74.0*  PLT 231  --  214 223    Medications:   amLODipine   10 mg Oral Daily   atorvastatin   40 mg Oral QHS    budesonide -glycopyrrolate -formoterol   2 puff Inhalation BID   carvedilol   25 mg Oral BID   DULoxetine   30 mg Oral QPM   famotidine   10 mg Oral Daily   gabapentin   100 mg Oral Q M,W,F-2000   heparin   5,000 Units Subcutaneous Q8H   hydrALAZINE   50 mg Oral BID   irbesartan   150 mg Oral Daily   levofloxacin   500 mg Oral Q48H   sodium chloride  flush  3 mL Intravenous Q12H    Dialysis Orders [facility] MWF 4 hours 2K/2Ca EDW 88 kg 400/700 Heparin  3000 units AVF with 15-g needles Assessment/Plan: Syncope: Patient had blackout episode x2 before HD. He was taken to the ED vs going to HD. BP was very high, but his HTN medication was held in anticipation of going to HD. He reports that this has been better. He has zio patch on to monitor for arrhythmias.  ESRD: On for HD today. Continue your normal OP HD.  HTN/volume: BP has been high but he does have extra fluid on.  Anemia  of ESRD: Last dose of ESA given 7/14. He had mircera 75 mcg. He receives Venofer  at dialysis. Last venofer  100 mg given 7/28. Hgb 10.8.  Secondary HPTH:  He receives Hectorol  5 mcg during HD. Last dose 03/23/24. Phos 4.7 and Ca 8.9.  Nutrition: Continue renal diet.    Belvie Och, NP 03/30/2024, 10:25 AM  [facility]

## 2024-03-30 NOTE — Progress Notes (Signed)
   03/30/24 1344  Vitals  Temp 97.6 F (36.4 C)  Pulse Rate 75  Resp 12  BP (!) 167/90  SpO2 97 %  O2 Device Room Air  Weight 89.1 kg  Type of Weight Post-Dialysis  Post Treatment  Dialyzer Clearance Lightly streaked  Liters Processed 72  Fluid Removed (mL) 3000 mL  Tolerated HD Treatment Yes  AVG/AVF Arterial Site Held (minutes) 5 minutes  AVG/AVF Venous Site Held (minutes) 5 minutes   Received patient in bed to unit.  Alert and oriented.  Informed consent signed and in chart.   TX duration: 3.5  Patient tolerated well.  Transported back to the room  Alert, without acute distress.  Hand-off given to patient's nurse.   Access used: Yes Access issues: No   Total UF removed: 3000 Medication(s) given: No Meds Given Post HD VS: See Above Grid Post HD weight: 89.1 kg   [facility]

## 2024-03-30 NOTE — Progress Notes (Addendum)
 Pt receives OP HD [facility], MWF, 1045 chair time. Will assist as needed. Per renal NP, plans are for pt to receive dialysis before d/c today.

## 2024-03-30 NOTE — Progress Notes (Signed)
 Reviewed AVS, patient expressed understanding of medications, MD follow up reviewed.   Removed IV, Site clean, dry and intact.  See LDA for information on wounds at discharge. CCMD contacted and informed patients is being discharged.  Patient states all belongings brought to the hospital at time of admission are accounted for and packed to take home.  Patient informed and expressed understanding to pick up discharge medications in [facility] pharmacy  Patient dressing; will contact nurses desk when ready to go outfront.

## 2024-03-30 NOTE — Discharge Summary (Signed)
 Name: George Day MRN: [id number] DOB: 02/03/1976 48 y.o. PCP: George Braun, DO  Date of Admission: 03/27/2024  2:37 PM Date of Discharge: 03/30/2024 Attending Physician: Dr. MICAEL Riis Winfrey  Discharge Diagnosis: 1. Principal Problem:   Syncope Active Problems:   GAD (generalized anxiety disorder)   ESRD (end stage renal disease) on dialysis (HCC)   Severe symptomatic hypertension   Pericardial effusion   Chronic diastolic (congestive) heart failure (HCC)   COPD (chronic obstructive pulmonary disease) (HCC)   Multiple sclerosis (HCC)   Peripheral neuropathy   Acute dyspnea   Hilar lymphadenopathy   MS (multiple sclerosis) (HCC)   Discharge Medications: Allergies as of 03/30/2024       Reactions   Bee Venom Anaphylaxis   Penicillins Anaphylaxis   Tomato Anaphylaxis, Swelling, Other (See Comments)   Throat swells- has  tolerated recently, though        Medication List     STOP taking these medications    aspirin  EC 81 MG tablet       TAKE these medications    amLODipine  10 MG tablet Commonly known as: NORVASC  TAKE 1 TABLET BY MOUTH DAILY   APPLE CIDER VINEGAR PO Take 5-10 mLs by mouth See admin instructions. Mix 5-10 ml's into eight ounces of water and drink by mouth once a week   atorvastatin  40 MG tablet Commonly known as: LIPITOR  TAKE 1 TABLET BY MOUTH DAILY What changed: when to take this   carvedilol  25 MG tablet Commonly known as: COREG  Take 1 tablet (25 mg total) by mouth 2 (two) times daily. What changed: See the new instructions.   DULoxetine  30 MG capsule Commonly known as: Cymbalta  Take 1 capsule (30 mg total) by mouth daily. What changed: when to take this   EPINEPHrine  0.3 mg/0.3 mL Soaj injection Commonly known as: EPI-PEN Inject 0.3 mg into the muscle as needed for anaphylaxis.   famotidine  20 MG tablet Commonly known as: PEPCID  TAKE 1 TABLET BY MOUTH DAILY   gabapentin  100 MG capsule Commonly known as: Neurontin  Take  100mg , three times a week only following dialysis. What changed:  how much to take how to take this when to take this additional instructions   hydrALAZINE  50 MG tablet Commonly known as: APRESOLINE  Take 1 tablet (50 mg total) by mouth 2 (two) times daily.   levofloxacin  500 MG tablet Commonly known as: LEVAQUIN  Take 1 tablet (500 mg total) by mouth every other day. Start taking on: April 01, 2024   nitroGLYCERIN  0.6 MG SL tablet Commonly known as: NITROSTAT  Place 1 tablet (0.6 mg total) under the tongue every 5 (five) minutes as needed for chest pain.   olmesartan  20 MG tablet Commonly known as: BENICAR  Take 1 tablet (20 mg total) by mouth at bedtime for high blood pressure.   polyethylene glycol 17 g packet Commonly known as: MiraLax  Take 17 g by mouth daily.   sevelamer  carbonate 800 MG tablet Commonly known as: RENVELA  Take 1 tablet (800 mg total) by mouth 3 (three) times daily with meals.   Trelegy Ellipta  100-62.5-25 MCG/ACT Aepb Generic drug: Fluticasone -Umeclidin-Vilant INHALE ONE (1) PUFF BY MOUTH DAILY What changed: See the new instructions.        Disposition and follow-up:   [name] was discharged from [facility] in Good condition.  At the hospital follow up visit please address:  1.  Patient recently moved from [place]. He has seen Neurologist Dr. Onita previously, and will need to follow up with  MS specialist Dr. Charlie Day.  [  ] Please make sure referral and appointment with Dr. Crete are coordinated at PCP follow up  2.  Labs / imaging needed at time of follow-up:   [  ] LDCT Lung in 3 months, incidental hilar adenopathy seen on CT   3.  Pending labs/ test needing follow-up: Ziopatch with Cardiology   Follow-up Appointments:  Follow-up Information     Day, Emilie, DO. Go on 04/07/2024.   Specialty: Internal Medicine Why: PCP appointment on Tuesday April 07, 2024 at 10:45 am in the Internal Medicine Clinic Contact  information: [address] [place] [address] [phone]         George Day, [place]. Go on 05/07/2024.   Specialties: Cardiology, Radiology Why: Go to Cardiology appointment on 05/17/2024 at 8:25am. Contact information: [address] [place] [address] [phone]         Day George LABOR, MD. Schedule an appointment as soon as possible for a visit.   Specialty: Neurology Why: Please call Dr. Duncan office to schedule appointment in outpatient setting with them. Contact information: [address] [place] [address] [phone]                 Hospital Course by problem list: George Day is a 48 y.o. with a pertinent PMH of ESRD on HD, MS, HFpEF, COPD, and HTN, who presented after two syncopal episodes and admitted for syncope work up.  Based on abnormal telemetry showing occasional PACs and echocardiogram showing severe asymmetric LVH there is concern for cardiac syncope. Cardiac MRI about 15 months ago showed no evidence of hypertrophic cardiomyopathy, now being discharged on hospital day 0 with Ziopatch and with the following pertinent hospital course:  #Syncope Patient presented on 8/8 after 2 acute episodes of syncope on 8/7. Before both episodes, his vision slowly went black, but he did not have nausea, palpitations, or dizziness prior. By morning of 8/9, he reported feeling much improved and had no further syncopal episodes during admission. TTE with severe asymmetric LV hypertrophy of the lateral segment. He had a prior Cardiac MRI.  Due to no abnormalities on telemetry overnight and recent cardiac MRI (15 months ago) cardiology prefers discharge with 14-day monitor and possible repeat cardiac MRI outpatient. MRI C-spine showed demyelinating disease/multiple sclerosis with overall appearance relatively stable as compared to prior exam from 10/26/2023, and no new lesions or evidence for active demyelination. It also showed multilevel  cervical spondylosis with resultant mild to moderate spinal stenosis at C4-5 and C6-7, and multifactorial degenerative changes with resultant multilevel foraminal narrowing as above. Notable findings included severe right with moderate left C5 foraminal stenosis, with severe left and moderate right C7 foraminal narrowing. MRI brain showed multiple sclerosis with no evidence for active demyelination. Telemetry was monitored and showed no evidence of new arrhythmia or acute ischemic changes. Ziopatch was placed before discharge and he will follow up with Cardiology outpatient.  - f/u outpatient with Cardiology  - Ziopatch placed   #Community-acquired pneumonia Remained stable on room air with resolution of cough and shortness of breath. Continued levofloxacin  500 mg every 48 hours, 5-day course to complete 8/13 after dialysis.    #Multiple Sclerosis He was not in an acute flare and so did not treat with steroids. Repeat MRI Brain and C-spine as above, but showed stable MS and no new lesions or active demyelination. He will follow-up with neurology outpatient, and will need to be scheduled with Dr. Charlie Crete,  MS specialist.  - f/u MS Specialist Neurology outpatient with Dr. Vear. Please make sure referral and appointment are coordinated at PCP follow up.   #COPD #Hilar lymphadenopathy As above stable on room air without signs of COPD exacerbation. Continued Breztri . Incidental hilar lymphadenopathy found yesterday on CT. Likely inflammatory, however possible malignancy due to cigarette usage in the past. Recommend follow up scans in 3 months.  - LDCT Lung in 3 months    #Hypertension He was continued on home amlodipine  10 mg daily, hydralazine  50 mg twice daily, irbesartan  150 mg daily, and carvedilol  25 mg twice daily during admission.    #ESRD #Anemia of ESRD Nephrology followed. HD MWF, he was continued on this schedule during admission and received inpatient HD on day of discharge 8/11.     #GAD  He was continued on home duloxetine  30mg  daily during admission.     Subjective Patient resting comfortably in bed this morning. He denies any further syncopal episodes. Denies headaches, light headedness, dizziness, chest pain, shortness of breath. He reports that he slept well. Ziopatch was already placed on him and he feels ready for discharge.   Discharge Exam:   BP (!) 162/88   Pulse 76   Temp 98 F (36.7 C)   Resp 15   Ht 5' 8 (1.727 m)   Wt 91.9 kg   SpO2 99%   BMI 30.81 kg/m   Physical Exam:   Constitutional: well-appearing male sitting in hospital bed, in no acute distress HEENT: normocephalic atraumatic, mucous membranes moist Eyes: conjunctiva non-erythematous Cardiovascular: regular rate and rhythm, bilateral radial pulses 2+, bilateral dorsal pedal pulses 2+, brisk capillary refill bilateral feet and hands  Pulmonary/Chest: normal work of breathing on room air, lungs clear to auscultation bilaterally Abdominal: soft, non-tender, non-distended MSK: normal bulk and tone. Neurological: alert & oriented x 3 Skin: warm and dry Psych: mood calm, behavior normal, thought content normal, judgement normal    Pertinent Labs, Studies, and Procedures:     Latest Ref Rng & Units 03/30/2024   10:52 AM 03/29/2024    4:34 AM 03/28/2024    1:04 AM  CBC  WBC 4.0 - 10.5 K/uL 5.5  5.9  8.8   Hemoglobin 13.0 - 17.0 g/dL 89.2  89.1  89.4   Hematocrit 39.0 - 52.0 % 31.7  32.5  31.5   Platelets 150 - 400 K/uL 257  223  214        Latest Ref Rng & Units 03/30/2024   10:52 AM 03/29/2024    4:34 AM 03/28/2024    1:04 AM  CMP  Glucose 70 - 99 mg/dL 893  898  888   BUN 6 - 20 mg/dL 62  49  57   Creatinine 0.61 - 1.24 mg/dL 2.29  3.11  3.25   Sodium 135 - 145 mmol/L 132  135  135   Potassium 3.5 - 5.1 mmol/L 3.7  3.8  3.4   Chloride 98 - 111 mmol/L 100  100  107   CO2 22 - 32 mmol/L 20  23  16    Calcium  8.9 - 10.3 mg/dL 8.8  8.9  8.9     ECHOCARDIOGRAM  COMPLETE Result Date: 03/28/2024    ECHOCARDIOGRAM REPORT   Patient Name:   Kielan Dreisbach Date of Exam: 03/28/2024 Medical Rec #:  [id number]      Height:       68.0 in Accession #:    [id number]     Weight:  204.4 lb Date of Birth:  12/05/1975       BSA:          2.063 m Patient Age:    48 years       BP:           171/93 mmHg Patient Gender: M              HR:           94 bpm. Exam Location:  Inpatient Procedure: 2D Echo, 3D Echo, Cardiac Doppler, Color Doppler and Strain Analysis            (Both Spectral and Color Flow Doppler were utilized during            procedure). STAT ECHO Indications:    Pericardial Effusion  History:        Patient has prior history of Echocardiogram examinations. CHF,                 CAD, COPD, Signs/Symptoms:Dyspnea; Risk Factors:Hypertension,                 Diabetes and Dyslipidemia. CKD Stage 4.  Sonographer:    Philomena Daring Referring Phys: ELNORA IP IMPRESSIONS  1. Left ventricular ejection fraction, by estimation, is 60 to 65%. The left ventricle has normal function. The left ventricle has no regional wall motion abnormalities. There is severe asymmetric left ventricular hypertrophy of the lateral segment. Left ventricular diastolic parameters are indeterminate. The average left ventricular global longitudinal strain is -10.7 %.  2. Right ventricular systolic function is normal. The right ventricular size is normal. Tricuspid regurgitation signal is inadequate for assessing PA pressure.  3. Small to moderate circumferential pericardial effusion. Moderate effusion along RA. There is no evidence of cardiac tamponade.  4. The mitral valve is normal in structure. Trivial mitral valve regurgitation. No evidence of mitral stenosis.  5. The aortic valve has an indeterminant number of cusps. Aortic valve regurgitation is not visualized. No aortic stenosis is present. Aortic valve mean gradient measures 7.0 mmHg.  6. The inferior vena cava is normal in size with greater  than 50% respiratory variability, suggesting right atrial pressure of 3 mmHg. Comparison(s): Changes from prior study are noted. Pericardial effusion was previously large circumferential. FINDINGS  Left Ventricle: Left ventricular ejection fraction, by estimation, is 60 to 65%. The left ventricle has normal function. The left ventricle has no regional wall motion abnormalities. The average left ventricular global longitudinal strain is -10.7 %. Strain was performed and the global longitudinal strain is indeterminate. The left ventricular internal cavity size was normal in size. There is severe asymmetric left ventricular hypertrophy of the lateral segment. Left ventricular diastolic parameters are indeterminate. Right Ventricle: The right ventricular size is normal. No increase in right ventricular wall thickness. Right ventricular systolic function is normal. Tricuspid regurgitation signal is inadequate for assessing PA pressure. Left Atrium: Left atrial size was normal in size. Right Atrium: Right atrial size was normal in size. Pericardium: Small to moderate circumferential pericardial effusion. Moderate effusion along RA. A moderately sized pericardial effusion is present. The pericardial effusion is circumferential. There is no evidence of cardiac tamponade. Mitral Valve: The mitral valve is normal in structure. Trivial mitral valve regurgitation. No evidence of mitral valve stenosis. Tricuspid Valve: The tricuspid valve is normal in structure. Tricuspid valve regurgitation is not demonstrated. No evidence of tricuspid stenosis. Aortic Valve: The aortic valve has an indeterminant number of cusps. Aortic valve regurgitation is not visualized. No aortic  stenosis is present. Aortic valve mean gradient measures 7.0 mmHg. Aortic valve peak gradient measures 13.1 mmHg. Aortic valve area, by VTI measures 2.17 cm. Pulmonic Valve: The pulmonic valve was normal in structure. Pulmonic valve regurgitation is trivial. No  evidence of pulmonic stenosis. Aorta: The aortic root and ascending aorta are structurally normal, with no evidence of dilitation. Venous: The inferior vena cava is normal in size with greater than 50% respiratory variability, suggesting right atrial pressure of 3 mmHg. IAS/Shunts: No atrial level shunt detected by color flow Doppler. Additional Comments: 3D was performed not requiring image post processing on an independent workstation and was indeterminate.  LEFT VENTRICLE PLAX 2D LVIDd:         4.60 cm      Diastology LVIDs:         3.20 cm      LV e' medial:    6.74 cm/s LV PW:         1.40 cm      LV E/e' medial:  17.2 LV IVS:        1.40 cm      LV e' lateral:   8.92 cm/s LVOT diam:     2.00 cm      LV E/e' lateral: 13.0 LV SV:         68 LV SV Index:   33           2D Longitudinal Strain LVOT Area:     3.14 cm     2D Strain GLS Avg:     -10.7 %  LV Volumes (MOD) LV vol d, MOD A2C: 91.0 ml LV vol d, MOD A4C: 153.0 ml LV vol s, MOD A2C: 23.5 ml LV vol s, MOD A4C: 38.6 ml LV SV MOD A2C:     67.5 ml LV SV MOD A4C:     153.0 ml LV SV MOD BP:      86.9 ml RIGHT VENTRICLE             IVC RV S prime:     16.00 cm/s  IVC diam: 1.60 cm TAPSE (M-mode): 3.2 cm LEFT ATRIUM             Index        RIGHT ATRIUM           Index LA diam:        4.00 cm 1.94 cm/m   RA Area:     17.60 cm LA Vol (A2C):   55.1 ml 26.71 ml/m  RA Volume:   43.60 ml  21.14 ml/m LA Vol (A4C):   58.5 ml 28.36 ml/m LA Biplane Vol: 61.2 ml 29.67 ml/m  AORTIC VALVE AV Area (Vmax):    2.27 cm AV Area (Vmean):   2.18 cm AV Area (VTI):     2.17 cm AV Vmax:           181.00 cm/s AV Vmean:          123.000 cm/s AV VTI:            0.313 m AV Peak Grad:      13.1 mmHg AV Mean Grad:      7.0 mmHg LVOT Vmax:         131.00 cm/s LVOT Vmean:        85.500 cm/s LVOT VTI:          0.216 m LVOT/AV VTI ratio: 0.69  AORTA Ao Root diam: 2.50 cm Ao Asc diam:  3.30 cm  MITRAL VALVE MV Area (PHT): 4.21 cm     SHUNTS MV Decel Time: 180 msec     Systemic VTI:  0.22  m MV E velocity: 116.00 cm/s  Systemic Diam: 2.00 cm MV A velocity: 84.00 cm/s MV E/A ratio:  1.38 Vishnu Priya Mallipeddi Electronically signed by Diannah Late Mallipeddi Signature Date/Time: 03/28/2024/8:40:30 AM    Final    CT Angio Chest/Abd/Pel for Dissection W and/or Wo Contrast Result Date: 03/27/2024 CLINICAL DATA:  Acute aortic syndrome (AAS) suspected.  Dizziness EXAM: CT ANGIOGRAPHY CHEST, ABDOMEN AND PELVIS TECHNIQUE: Non-contrast CT of the chest was initially obtained. Multidetector CT imaging through the chest, abdomen and pelvis was performed using the standard protocol during bolus administration of intravenous contrast. Multiplanar reconstructed images and MIPs were obtained and reviewed to evaluate the vascular anatomy. RADIATION DOSE REDUCTION: This exam was performed according to the departmental dose-optimization program which includes automated exposure control, adjustment of the mA and/or kV according to patient size and/or use of iterative reconstruction technique. CONTRAST:  OMNIPAQUE  IOHEXOL  350 MG/ML SOLN COMPARISON:  Chest x-ray 03/27/2024, CT abdomen pelvis 02/06/2024 FINDINGS: CTA CHEST FINDINGS Cardiovascular: Satisfactory opacification of the pulmonary arteries to the segmental level. No evidence of pulmonary embolism. Enlarged heart size. No significant pericardial effusion. The thoracic aorta is normal in caliber. No thoracic aorta dissection. No atherosclerotic plaque of the thoracic aorta. No coronary artery calcifications. Mediastinum/Nodes: Right hilar lymphadenopathy measuring up to 1.7 cm. Left hilar lymphadenopathy measuring up to 1 cm. No enlarged mediastinal or axillary lymph nodes. Thyroid gland, trachea, and esophagus demonstrate no significant findings. Lungs/Pleura: Diffuse bronchial wall thickening with peribronchovascular ground-glass and consolidative airspace opacities most prominent along the bilateral lower lobes and right apex (7:28). No pulmonary nodule.  No pulmonary mass. Trace bilateral pleural effusion. No pneumothorax. Musculoskeletal: No chest wall abnormality. No suspicious lytic or blastic osseous lesions. No acute displaced fracture. Review of the MIP images confirms the above findings. CTA ABDOMEN AND PELVIS FINDINGS VASCULAR Aorta: Normal caliber aorta without aneurysm, dissection, vasculitis or significant stenosis. Celiac: Patent without evidence of aneurysm, dissection, vasculitis or significant stenosis. SMA: Patent without evidence of aneurysm, dissection, vasculitis or significant stenosis. Renals: Both renal arteries are patent without evidence of aneurysm, dissection, vasculitis, fibromuscular dysplasia or significant stenosis. IMA: Patent without evidence of aneurysm, dissection, vasculitis or significant stenosis. Inflow: Mild atherosclerotic plaque. Patent without evidence of aneurysm, dissection, vasculitis or significant stenosis. Veins: No obvious venous abnormality within the limitations of this arterial phase study. Review of the MIP images confirms the above findings. NON-VASCULAR Hepatobiliary: No focal liver abnormality. No gallstones, gallbladder wall thickening, or pericholecystic fluid. No biliary dilatation. Pancreas: No focal lesion. Normal pancreatic contour. No surrounding inflammatory changes. No main pancreatic ductal dilatation. Spleen: Normal in size without focal abnormality. Adrenals/Urinary Tract: No adrenal nodule bilaterally. Bilateral kidneys enhance symmetrically. Couple of 5 cm right renal fluid density lesion with associated peripheral calcifications measuring up to 1 mm. No hydronephrosis. No hydroureter. The urinary bladder is unremarkable. Stomach/Bowel: Stomach is within normal limits. No evidence of bowel wall thickening or dilatation. Appendix appears normal. Lymphatic: Prominent bilateral but nonenlarged inguinal lymph nodes. No lymphadenopathy. Reproductive: Prostate is unremarkable. Other: No intraperitoneal  free fluid. No intraperitoneal free gas. No organized fluid collection. Musculoskeletal: No abdominal wall hernia or abnormality. Right inferior midline gluteal subcutaneus soft tissue edema (6:395). No suspicious lytic or blastic osseous lesions. No acute displaced fracture. Multilevel degenerative changes of the spine. Review of the MIP images confirms the above  findings. IMPRESSION: 1. Diffuse bronchial wall thickening with peribronchovascular ground-glass and consolidative airspace opacities most prominent along the bilateral lower lobes and right apex. Finding may represent infection/inflammation versus alveolar hemorrhage. COVID-19 infection not excluded. Recommend CT follow-up in 3 months to evaluate for complete resolution. 2. Trace bilateral pleural effusions. 3. Bilateral hilar lymphadenopathy likely reactive in etiology. Recommend attention on follow-up. 4. Cardiomegaly. 5. No acute thoracic or abdominal aorta abnormality. 6. No pulmonary embolus. 7. Multiple lesions, including right Bosniak II benign renal cyst measuring 5 cm. No follow-up imaging is recommended. JACR 2018 Feb; 264-273, Management of the Incidental Renal Mass on CT, [employer] 2021; 814-848, Bosniak Classification of Cystic Renal Masses, Version 2019. 8. Right inferior midline gluteal subcutaneus soft tissue edema. Correlate with physical exam. Electronically Signed   By: Morgane  Naveau M.D.   On: 03/27/2024 21:59   DG Chest Portable 1 View Result Date: 03/27/2024 CLINICAL DATA:  Cough EXAM: PORTABLE CHEST 1 VIEW COMPARISON:  Chest x-ray 02/06/2024 FINDINGS: The heart is mildly enlarged. The lungs are clear. There is no pleural effusion or pneumothorax. No acute fractures are seen. IMPRESSION: Mild cardiomegaly. No acute pulmonary process. Electronically Signed   By: Greig Pique M.D.   On: 03/27/2024 15:55     Discharge Instructions: Discharge Instructions     Ambulatory referral to Neurology   Complete by: As directed     An appointment is requested in approximately: 4 weeks, MS patient who was lost to follow up after moving from [place]  recently   Ambulatory referral to Occupational Therapy   Complete by: As directed    Ambulatory referral to Physical Therapy   Complete by: As directed    Recommend water aerobics         Discharge Instructions      To Mr. Marcoantonio Legault or their caretakers,  They were admitted to [facility] on 03/27/2024 for evaluation and treatment of: fainting episodes     The evaluation suggested it is not due to active MS flare, but may be related to your heart.   They were discharged from the hospital on 03/30/24. I recommend the following after leaving the hospital:   Medications Adjusted During Hospital Admission:  1) TAKE Levofloxacin  500 mg tablet: by mouth on 04/01/24 after dialysis   2) TAKE Carvedilol  25 mg: one tablet twice daily     1) DO NOT TAKE Aspirin  81 mg   Home Medications Continued During Hospital Admission without Changes:  1) Continue Amlodipine  (Norvasc ) 10 mg daily   2) Continue Atorvastatin  (Lipitor ) 40 mg daily   3) Continue Duloxetine  (Cymbalta ) 30 mg daily   4) Continue Famotidine  (Pepcid ) 20 mg daily   5) Continue Gabapentin  (Neurontin ) 100 mg three times a week following dialysis   6) Continue Hydralazine  (Apresoline ) 50 mg twice daily   7) Continue Nitroglycerin  0.6 mg as needed for chest pain   8) Continue Olmesartan  (Benicar ) 20 mg tablet nightly   9) Continue Trelegy Ellipta  1 puff daily    We arranged for you to follow up at:   Follow-up Information     Day, Emilie, DO. Go on 04/07/2024.   Specialty: Internal Medicine Why: PCP appointment on Tuesday April 07, 2024 at 10:45 am in the Internal Medicine Clinic Contact information: [address] [place] [address] [phone]         George Day, [place]. Go on 05/07/2024.   Specialties: Cardiology, Radiology Why: Go to Cardiology appointment on  05/17/2024  at 8:25am. Contact information: [address] [place] [address] [phone]         Vear George LABOR, MD. Schedule an appointment as soon as possible for a visit.   Specialty: Neurology Why: Please call Dr. Duncan office to schedule appointment in outpatient setting with them. Contact information: [address] [place] [address] [phone]                 Please also make sure you go to your appointment on 03/31/24 at 9:45 am with Christopher Pavero, [facility]  Address: [address] [place] [address] Phone: [phone]  You will need to schedule an appointment with Dr. Charlie Vear, Neurology MS Specialist. We can help coordinate a referral if necessary when you see your PCP on 04/07/24.    For questions about your care plan, until you are able to see your primary doctor: Call [phone]. Dial 0 for the operator. Ask for the internal medicine resident on call.  Thank you for allowing us  to be part of your care.   Doyal Miyamoto, MD 03/30/2024, 12:04 PM       Signed: Doyal Miyamoto, MD 03/30/2024, 12:11 PM

## 2024-03-30 NOTE — Discharge Instructions (Addendum)
 To [name] or their caretakers,  They were admitted to [facility] on 03/27/2024 for evaluation and treatment of: fainting episodes     The evaluation suggested it is not due to active MS flare, but may be related to your heart.   They were discharged from the hospital on 03/30/24. I recommend the following after leaving the hospital:   Medications Adjusted During Hospital Admission:  1) TAKE Levofloxacin  500 mg tablet: by mouth on 04/01/24 after dialysis   2) TAKE Carvedilol  25 mg: one tablet twice daily     1) DO NOT TAKE Aspirin  81 mg   Home Medications Continued During Hospital Admission without Changes:  1) Continue Amlodipine  (Norvasc ) 10 mg daily   2) Continue Atorvastatin  (Lipitor ) 40 mg daily   3) Continue Duloxetine  (Cymbalta ) 30 mg daily   4) Continue Famotidine  (Pepcid ) 20 mg daily   5) Continue Gabapentin  (Neurontin ) 100 mg three times a week following dialysis   6) Continue Hydralazine  (Apresoline ) 50 mg twice daily   7) Continue Nitroglycerin  0.6 mg as needed for chest pain   8) Continue Olmesartan  (Benicar ) 20 mg tablet nightly   9) Continue Trelegy Ellipta  1 puff daily    We arranged for you to follow up at:   Follow-up Information     Rihner, Emilie, DO. Go on 04/07/2024.   Specialty: Internal Medicine Why: PCP appointment on Tuesday April 07, 2024 at 10:45 am in the Internal Medicine Clinic Contact information: [address] [place] [address] [phone]         Janene Boer, [place]. Go on 05/07/2024.   Specialties: Cardiology, Radiology Why: Go to Cardiology appointment on 05/17/2024 at 8:25am. Contact information: [address] [place] [address] [phone]         Vear Charlie LABOR, MD. Schedule an appointment as soon as possible for a visit.   Specialty: Neurology Why: Please call Dr. Duncan office to schedule appointment in outpatient setting with them. Contact information: [address] [place] [address] [phone]                 Please also make sure you go to your appointment on 03/31/24 at 9:45 am with Christopher Pavero, [facility]  Address: [address] [place] [address] Phone: [phone]  You will need to schedule an appointment with Dr. Charlie Vear, Neurology MS Specialist. We can help coordinate a referral if necessary when you see your PCP on 04/07/24.    For questions about your care plan, until you are able to see your primary doctor: Call [phone]. Dial 0 for the operator. Ask for the internal medicine resident on call.  Thank you for allowing us  to be part of your care.   Doyal Miyamoto, MD 03/30/2024, 12:04 PM

## 2024-03-31 ENCOUNTER — Telehealth: Payer: Self-pay | Admitting: Nurse Practitioner

## 2024-03-31 ENCOUNTER — Ambulatory Visit: Attending: Cardiology | Admitting: Pharmacist

## 2024-03-31 ENCOUNTER — Encounter: Payer: Self-pay | Admitting: Pharmacist

## 2024-03-31 ENCOUNTER — Telehealth: Payer: Self-pay

## 2024-03-31 VITALS — BP 133/76 | HR 70

## 2024-03-31 DIAGNOSIS — I10 Essential (primary) hypertension: Secondary | ICD-10-CM

## 2024-03-31 DIAGNOSIS — I2510 Atherosclerotic heart disease of native coronary artery without angina pectoris: Secondary | ICD-10-CM | POA: Diagnosis not present

## 2024-03-31 DIAGNOSIS — R55 Syncope and collapse: Secondary | ICD-10-CM | POA: Diagnosis not present

## 2024-03-31 DIAGNOSIS — I5032 Chronic diastolic (congestive) heart failure: Secondary | ICD-10-CM

## 2024-03-31 NOTE — Transitions of Care (Post Inpatient/ED Visit) (Signed)
 03/31/2024  Name: George Day MRN: [id number] DOB: 04-21-1976  Today's TOC FU Call Status: Today's TOC FU Call Status:: Successful TOC FU Call Completed TOC FU Call Complete Date: 03/31/24 Patient's Name and Date of Birth confirmed.  Transition Care Management Follow-up Telephone Call Date of Discharge: 03/30/24 Discharge Facility: [facility]) Type of Discharge: Inpatient Admission Primary Inpatient Discharge Diagnosis:: syncope How have you been since you were released from the hospital?: Better Any questions or concerns?: No  Items Reviewed: Did you receive and understand the discharge instructions provided?: Yes Medications obtained,verified, and reconciled?: Yes (Medications Reviewed) Any new allergies since your discharge?: No Dietary orders reviewed?: Yes Do you have support at home?: Yes People in Home [RPT]: spouse  Medications Reviewed Today: Medications Reviewed Today     Reviewed by Emmitt Pan, LPN (Licensed Practical Nurse) on 03/31/24 at 1530  Med List Status: <None>   Medication Order Taking? Sig Documenting Provider Last Dose Status Informant  amLODipine  (NORVASC ) 10 MG tablet [id number] Yes TAKE 1 TABLET BY MOUTH DAILY Gabino Boga, MD  Active Self  APPLE CIDER VINEGAR PO [id number] Yes Take 5-10 mLs by mouth See admin instructions. Mix 5-10 ml's into eight ounces of water and drink by mouth once a week [provider]  Active Self  atorvastatin  (LIPITOR ) 40 MG tablet [id number] Yes TAKE 1 TABLET BY MOUTH DAILY  Patient taking differently: Take 40 mg by mouth at bedtime.   Gabino Boga, MD  Active Self  carvedilol  (COREG ) 25 MG tablet [id number] Yes Take 1 tablet (25 mg total) by mouth 2 (two) times daily. Jolaine Pac, DO  Active   DULoxetine  (CYMBALTA ) 30 MG capsule [id number] Yes Take 1 capsule (30 mg total) by mouth daily.  Patient taking differently: Take 30 mg by mouth every evening.   Onita Duos, MD  Active Self   EPINEPHrine  0.3 mg/0.3 mL IJ SOAJ injection [id number] Yes Inject 0.3 mg into the muscle as needed for anaphylaxis. [provider]  Active Self           Med Note MARISA, NATHANEL SAILOR   Fri Mar 27, 2024  4:02 PM) PROVIDER: The patient stated he is in need of a NEW Rx (for this)  famotidine  (PEPCID ) 20 MG tablet [id number] Yes TAKE 1 TABLET BY MOUTH DAILY Gabino Boga, MD  Active Self  gabapentin  (NEURONTIN ) 100 MG capsule [id number] Yes Take 100mg , three times a week only following dialysis.  Patient taking differently: Take 100 mg by mouth See admin instructions. Take 100 mg by mouth on Mon/Wed/Fri as directed after dialysis   Gabino Boga, MD  Active Self           Med Note ANGUS, TINNIE Repress Dec 15, 2023  8:10 AM)    hydrALAZINE  (APRESOLINE ) 50 MG tablet [id number] Yes Take 1 tablet (50 mg total) by mouth 2 (two) times daily.   Active Self  levofloxacin  (LEVAQUIN ) 500 MG tablet [id number] Yes Take 1 tablet (500 mg total) by mouth every other day. Jolaine Pac, DO  Active   nitroGLYCERIN  (NITROSTAT ) 0.6 MG SL tablet [id number] Yes Place 1 tablet (0.6 mg total) under the tongue every 5 (five) minutes as needed for chest pain. Ngetich, Dinah C, NP  Active Self           Med Note ANGUS, TINNIE Repress Dec 15, 2023  8:11 AM)    olmesartan  (BENICAR ) 20 MG tablet [id number] Yes Take 1 tablet (20 mg total) by mouth at bedtime  for high blood pressure.   Active Self  polyethylene glycol (MIRALAX ) 17 g packet [id number]  Take 17 g by mouth daily.  Patient not taking: Reported on 03/31/2024   Gabino Boga, MD  Active Self  sevelamer  carbonate (RENVELA ) 800 MG tablet [id number]  Take 1 tablet (800 mg total) by mouth 3 (three) times daily with meals.  Patient not taking: Reported on 03/31/2024     Active Self  TRELEGY ELLIPTA  100-62.5-25 MCG/ACT AEPB [id number] Yes INHALE ONE (1) PUFF BY MOUTH DAILY  Patient taking differently: Inhale 1 puff into the lungs daily.   Gabino Boga, MD   Active Self            Home Care and Equipment/Supplies: Were Home Health Services Ordered?: NA Any new equipment or medical supplies ordered?: NA  Functional Questionnaire: Do you need assistance with bathing/showering or dressing?: No Do you need assistance with meal preparation?: No Do you need assistance with eating?: No Do you have difficulty maintaining continence: No Do you need assistance with getting out of bed/getting out of a chair/moving?: No Do you have difficulty managing or taking your medications?: No  Follow up appointments reviewed: PCP Follow-up appointment confirmed?: Yes Date of PCP follow-up appointment?: 04/07/24 Follow-up Provider: [facility] Follow-up appointment confirmed?: Yes Date of Specialist follow-up appointment?: 03/31/24 Follow-Up Specialty Provider:: cardio Do you need transportation to your follow-up appointment?: No Do you understand care options if your condition(s) worsen?: Yes-patient verbalized understanding    SIGNATURE Julian Lemmings, LPN [facility] Nurse Health Advisor Direct Dial [phone]

## 2024-03-31 NOTE — Telephone Encounter (Signed)
 Transition of care contact from inpatient facility  Date of Discharge: 03/30/24 Date of Contact: 03/31/24 Method of contact: Phone  Attempted to contact patient to discuss transition of care from inpatient admission. Patient did not answer the phone. Message was left on the patient's voicemail with call back number [phone].

## 2024-03-31 NOTE — Patient Instructions (Addendum)
 It was nice meeting you today  We would like your blood pressure to stay less than 130/80  Please continue: Amlodipine  10mg  in the morning Hydralazine  50mg  twice a day Olmesartan  20mg  in the evening Carvedilol  25mg  twice a day   Try to increase vegetables and limit sodium  Medford Bolk, PharmD, BCACP, CDCES, CPP [facility] [address] [place], [place] [address] Phone: [phone]; Fax: [phone] 03/31/2024 9:59 AM

## 2024-03-31 NOTE — Progress Notes (Signed)
 Patient ID: George Day                 DOB: [birth year]                      MRN: [id number]     HPI: Tavius Turgeon is a 48 y.o. male referred by Dr. Court to HTN clinic. PMH is significant for HFpEF, ESRD on dialysis, HTN, CAD, and history of CVA.  Patient presents today with wife to discuss HTN. Had been routinely forgetting to take medications despite using pill packs. His 16 year old daughter has now set alerts and alarms on the phone to remind him. He reports recent compliance.  Currently managed on olmesartan , carvedilol , amlodipine , and hydralazine . Options remain limited due to ESRD. Last eGFR 8, SCR 7.70.  Not currently working and is receiving social secuirty benefits which will run out soon.  Receives dialysis MWF.   Receives food from meal banks. Trying to bake most foods. Uses Mrs Holl[employer] for seasoning and is drinking mostly water. Takes care of four daughters. Wife works as Teacher, early years/pre.   Current HTN meds:  Olmesartan  20mg  in evening Amlodipine  10mg  in morning Carvedilol  25mg  BID Hydralazine  50mg  BID  BP goal: <130/80  Wt Readings from Last 3 Encounters:  03/30/24 196 lb 6.9 oz (89.1 kg)  02/06/24 211 lb 3.2 oz (95.8 kg)  01/06/24 211 lb 1.6 oz (95.8 kg)   BP Readings from Last 3 Encounters:  03/30/24 (!) 157/92  02/06/24 (!) 181/104  02/05/24 (!) 229/134   Pulse Readings from Last 3 Encounters:  03/30/24 72  02/06/24 83  02/05/24 96    Renal function: Estimated Creatinine Clearance: 12.7 mL/min (A) (by C-G formula based on SCr of 7.7 mg/dL (H)).  Past Medical History:  Diagnosis Date   (HFpEF) heart failure with preserved ejection fraction (HCC)    Acute kidney injury superimposed on chronic kidney disease (HCC)    Acute respiratory distress 02/13/2023   Anemia 1998   Anginal pain (HCC)    Anxiety    Asthma    CKD (chronic kidney disease) stage 5, GFR less than 15 ml/min (HCC)    Coronary artery disease    Diabetes mellitus without  complication (HCC) 1995   Hypertension 2019   MI (myocardial infarction) (HCC) 2010   Multiple sclerosis (HCC)    Sleep apnea    Stroke (HCC) 2010    Current Outpatient Medications on File Prior to Visit  Medication Sig Dispense Refill   amLODipine  (NORVASC ) 10 MG tablet TAKE 1 TABLET BY MOUTH DAILY 30 tablet 10   APPLE CIDER VINEGAR PO Take 5-10 mLs by mouth See admin instructions. Mix 5-10 ml's into eight ounces of water and drink by mouth once a week     atorvastatin  (LIPITOR ) 40 MG tablet TAKE 1 TABLET BY MOUTH DAILY (Patient taking differently: Take 40 mg by mouth at bedtime.) 30 tablet 10   carvedilol  (COREG ) 25 MG tablet Take 1 tablet (25 mg total) by mouth 2 (two) times daily.     DULoxetine  (CYMBALTA ) 30 MG capsule Take 1 capsule (30 mg total) by mouth daily. (Patient taking differently: Take 30 mg by mouth every evening.) 30 capsule 11   EPINEPHrine  0.3 mg/0.3 mL IJ SOAJ injection Inject 0.3 mg into the muscle as needed for anaphylaxis.     famotidine  (PEPCID ) 20 MG tablet TAKE 1 TABLET BY MOUTH DAILY 90 tablet 11   gabapentin  (NEURONTIN ) 100 MG capsule Take 100mg , three  times a week only following dialysis. (Patient taking differently: Take 100 mg by mouth See admin instructions. Take 100 mg by mouth on Mon/Wed/Fri as directed after dialysis) 30 capsule 3   hydrALAZINE  (APRESOLINE ) 50 MG tablet Take 1 tablet (50 mg total) by mouth 2 (two) times daily. 60 tablet 5   [START ON 04/01/2024] levofloxacin  (LEVAQUIN ) 500 MG tablet Take 1 tablet (500 mg total) by mouth every other day. 1 tablet 0   nitroGLYCERIN  (NITROSTAT ) 0.6 MG SL tablet Place 1 tablet (0.6 mg total) under the tongue every 5 (five) minutes as needed for chest pain. 100 tablet 5   olmesartan  (BENICAR ) 20 MG tablet Take 1 tablet (20 mg total) by mouth at bedtime for high blood pressure. 30 tablet 11   polyethylene glycol (MIRALAX ) 17 g packet Take 17 g by mouth daily. (Patient not taking: Reported on 03/27/2024) 100 each 3    sevelamer  carbonate (RENVELA ) 800 MG tablet Take 1 tablet (800 mg total) by mouth 3 (three) times daily with meals. (Patient not taking: Reported on 03/27/2024) 270 tablet 3   TRELEGY ELLIPTA  100-62.5-25 MCG/ACT AEPB INHALE ONE (1) PUFF BY MOUTH DAILY (Patient taking differently: Inhale 1 puff into the lungs daily.) 60 each 10   No current facility-administered medications on file prior to visit.    Allergies  Allergen Reactions   Bee Venom Anaphylaxis   Penicillins Anaphylaxis   Tomato Anaphylaxis, Swelling and Other (See Comments)    Throat swells- has  tolerated recently, though     Assessment/Plan:  1. Hypertension -  Patient BP 127/77 and rechecked at 133/76 which is slight above goal of <130/80. Patient did take medications this morning. No medication changes needed today. Encouraged patient to continue medication compliance as uncontrolled HTN will cause further kidney injury. Advised to continue healthy low sodium eating and encouraged increased vegetable intake. Patient voiced understanding.  Continue: Olmesartan  20mg  in evening Amlodipine  10mg  in morning Carvedilol  25mg  BID Hydralazine  50mg  BID F/u as needed  Medford Bolk, PharmD, BCACP, CDCES, CPP [facility] [address] [place], [place] [address] Phone: [phone]; Fax: [phone] 03/31/2024 11:04 AM

## 2024-03-31 NOTE — Progress Notes (Signed)
 Late note entry 8:55am 03/31/24 Pt discharged, contacted clinic and informed of pt d/c and expected arrival Wednesday.

## 2024-04-01 DIAGNOSIS — Z992 Dependence on renal dialysis: Secondary | ICD-10-CM | POA: Diagnosis not present

## 2024-04-01 DIAGNOSIS — D631 Anemia in chronic kidney disease: Secondary | ICD-10-CM | POA: Diagnosis not present

## 2024-04-01 DIAGNOSIS — N186 End stage renal disease: Secondary | ICD-10-CM | POA: Diagnosis not present

## 2024-04-01 DIAGNOSIS — D689 Coagulation defect, unspecified: Secondary | ICD-10-CM | POA: Diagnosis not present

## 2024-04-01 DIAGNOSIS — Z23 Encounter for immunization: Secondary | ICD-10-CM | POA: Diagnosis not present

## 2024-04-01 DIAGNOSIS — I10 Essential (primary) hypertension: Secondary | ICD-10-CM | POA: Diagnosis not present

## 2024-04-01 DIAGNOSIS — N2581 Secondary hyperparathyroidism of renal origin: Secondary | ICD-10-CM | POA: Diagnosis not present

## 2024-04-03 DIAGNOSIS — D631 Anemia in chronic kidney disease: Secondary | ICD-10-CM | POA: Diagnosis not present

## 2024-04-03 DIAGNOSIS — N186 End stage renal disease: Secondary | ICD-10-CM | POA: Diagnosis not present

## 2024-04-03 DIAGNOSIS — I10 Essential (primary) hypertension: Secondary | ICD-10-CM | POA: Diagnosis not present

## 2024-04-03 DIAGNOSIS — Z992 Dependence on renal dialysis: Secondary | ICD-10-CM | POA: Diagnosis not present

## 2024-04-03 DIAGNOSIS — N2581 Secondary hyperparathyroidism of renal origin: Secondary | ICD-10-CM | POA: Diagnosis not present

## 2024-04-03 DIAGNOSIS — Z23 Encounter for immunization: Secondary | ICD-10-CM | POA: Diagnosis not present

## 2024-04-03 DIAGNOSIS — D689 Coagulation defect, unspecified: Secondary | ICD-10-CM | POA: Diagnosis not present

## 2024-04-06 ENCOUNTER — Telehealth: Payer: Self-pay | Admitting: *Deleted

## 2024-04-06 NOTE — Progress Notes (Signed)
 Complex Care Management Care Guide Note  04/06/2024 Name: George Day MRN: [id number] DOB: 08-Nov-1975  [name] Age is a 48 y.o. year old male who is a primary care patient of Rihner, Emilie, DO and is actively engaged with the care management team. I reached out to Burnard Pouch by phone today to assist with re-scheduling  with the RN Case Manager.  Follow up plan: Unsuccessful telephone outreach attempt made.  No further outreach attempts will be made at this time. We have been unable to contact the patient to reschedule for complex care management services.   [name]  [facility]  [facility], [facility] Guide  Direct Dial: [phone]  Fax [phone]

## 2024-04-07 ENCOUNTER — Ambulatory Visit

## 2024-04-07 ENCOUNTER — Other Ambulatory Visit: Payer: Self-pay

## 2024-04-07 VITALS — BP 158/83 | HR 65 | Temp 97.8°F | Ht 68.0 in | Wt 207.8 lb

## 2024-04-07 DIAGNOSIS — J449 Chronic obstructive pulmonary disease, unspecified: Secondary | ICD-10-CM | POA: Diagnosis not present

## 2024-04-07 DIAGNOSIS — R55 Syncope and collapse: Secondary | ICD-10-CM | POA: Diagnosis not present

## 2024-04-07 DIAGNOSIS — G4700 Insomnia, unspecified: Secondary | ICD-10-CM

## 2024-04-07 DIAGNOSIS — G35 Multiple sclerosis: Secondary | ICD-10-CM | POA: Diagnosis not present

## 2024-04-07 DIAGNOSIS — F411 Generalized anxiety disorder: Secondary | ICD-10-CM | POA: Diagnosis not present

## 2024-04-07 DIAGNOSIS — R590 Localized enlarged lymph nodes: Secondary | ICD-10-CM

## 2024-04-07 DIAGNOSIS — I10 Essential (primary) hypertension: Secondary | ICD-10-CM | POA: Diagnosis not present

## 2024-04-07 NOTE — Assessment & Plan Note (Signed)
 Patient was hospitalized for 2 syncopal episodes. Currently wearing ziopatch and following up with cardiology 04/21/2024 Patient reports no new syncopal episodes or dizziness.  -patient aware that if anything changes to let us  know.

## 2024-04-07 NOTE — Assessment & Plan Note (Signed)
 Patient stated that he is anxious daily and it is affected his day/day life and could be contributing to his trouble sleeping. He denies depression, mania, or family history of mania. Patient denies SI/SH/HI. Patient stated while in hospital he was getting duloxetine  30 mg for increased anxiety (he states he has never taken this before). He said it really helped while in hospital, and he has not taken it since leaving the hospital. He stated that he has only ever taken xanax for anxiety previously. He is interested in restarting the duloxetine  30 mg and counseling.  -referral to behavioral health with Renda Pontes  -duloxetine  to 30 mg daily re-ordered -return in 4 weeks for f/u

## 2024-04-07 NOTE — Patient Instructions (Addendum)
 Today we discussed the following medical conditions and plan:   Anxiety  -start zoloft (return in 4-6 weeks) -referral to counseling here with Renda Pontes  HTN -increase hydralazine  50 mg twice daily  -continue all your other medications as you have been taking them  -keep a blood pressure log -bring your blood pressure log and blood pressure cuff to your next appointment  -wear your CPAP  Sleep -try to sleep in cold dark room, wear your CPAP  MS -aquatic therapy referral -neurology referral    We look forward to seeing you next time. Please call our clinic at [phone] if you have any questions or concerns. The best time to call is Monday-Friday from 9am-4pm, but there is someone available 24/7. If you need medication refills, please notify your pharmacy one week in advance and they will send us  a request.   Thank you for trusting me with your care. Wishing you the best!   Sallyanne Primas, DO  [facility]

## 2024-04-07 NOTE — Assessment & Plan Note (Signed)
 Patient had CAP in hospital and finished course of levofloxacin  (5 days). No concern for COPD exacerbation while hospitalized. Continued breztri . Incidental hilar lymphadenopathy was found on CT while hospitalized, likely inflammatory, however has history of cigarette usage in past. Recommended repeat scans in 3 months  -LDCT lung ordered

## 2024-04-07 NOTE — Assessment & Plan Note (Addendum)
 Patient stated current medications: amlodipine  10 mg daily, Coreg  25 mg BID, hydralazine  25 milligrams BID, olmesartan  20 mg at bedtime.  Last appointment with cardiology was 02/2024, they also looked at his BP medications.  When patient takes BP at home, he states its around 140-180s/85-118s Patient currently non-adherent to CPAP. Last sleep study 2 years ago. Does not use CPAP every night.  Dialysis MWF. Patient reports no low BP readings during dialysis and no medication differences on dialysis days.   *written in hospital discharge instructions, it seems like he was discharged on hydralazine  50 mg, but patient stated taking 25 mg twice daily.   Plan -4 week follow up to discuss medications again  -increase hydralazine  50 mg twice daily  -continue  amlodipine  10 mg daily, Coreg  25 mg BID, olmesartan  20 mg at bedtime.   -patient counseled on keeping a blood pressure log at home -patient instructed to bring blood pressure cuff to next appointment  -patient instructed to wear CPAP nightly

## 2024-04-07 NOTE — Progress Notes (Signed)
 Follow up Hospitalization  Patient was admitted to [facility] on 03/27/2024 and discharged on 03/30/2024. He was treated for syncope. Treatment for this included Ziopatch and follow up with outpatient cardiology. He reports good compliance with treatment. He reports this condition is resolved.  ----------------------------------------------------------------------------------------- -   CC: hospital follow up and HTN management  HPI:  [name] is a 48 y.o. male with pertinent past medical history of HTN, CHF MS, and ESRD on dialysis (MWF) (further medical history stated below) and presents today for hospital follow up and HTN. Please see problem based assessment and plan for additional details.  Last clinic appointment: 01/06/2024 Last Hospitalization documented: 03/27/2024  Last Pertinent Labs Documented:     Latest Ref Rng & Units 03/30/2024   10:52 AM 03/29/2024    4:34 AM 03/28/2024    1:04 AM  BMP  Glucose 70 - 99 mg/dL 893  898  888   BUN 6 - 20 mg/dL 62  49  57   Creatinine 0.61 - 1.24 mg/dL 2.29  3.11  3.25   Sodium 135 - 145 mmol/L 132  135  135   Potassium 3.5 - 5.1 mmol/L 3.7  3.8  3.4   Chloride 98 - 111 mmol/L 100  100  107   CO2 22 - 32 mmol/L 20  23  16    Calcium  8.9 - 10.3 mg/dL 8.8  8.9  8.9        Latest Ref Rng & Units 03/30/2024   10:52 AM 03/29/2024    4:34 AM 03/28/2024    1:04 AM  CBC  WBC 4.0 - 10.5 K/uL 5.5  5.9  8.8   Hemoglobin 13.0 - 17.0 g/dL 89.2  89.1  89.4   Hematocrit 39.0 - 52.0 % 31.7  32.5  31.5   Platelets 150 - 400 K/uL 257  223  214     Lab Results  Component Value Date   HGBA1C 5.3 09/26/2023   HGBA1C 6.2 (H) 06/13/2023   HGBA1C 6.0 (H) 11/22/2022     Past Medical History:  Diagnosis Date   (HFpEF) heart failure with preserved ejection fraction (HCC)    Acute kidney injury superimposed on chronic kidney disease (HCC)    Acute respiratory distress 02/13/2023   Anemia 1998   Anginal pain (HCC)    Anxiety    Asthma     CKD (chronic kidney disease) stage 5, GFR less than 15 ml/min (HCC)    Coronary artery disease    Diabetes mellitus without complication (HCC) 1995   Hypertension 2019   MI (myocardial infarction) (HCC) 2010   Multiple sclerosis (HCC)    Sleep apnea    Stroke (HCC) 2010    Current Outpatient Medications on File Prior to Visit  Medication Sig Dispense Refill   amLODipine  (NORVASC ) 10 MG tablet TAKE 1 TABLET BY MOUTH DAILY 30 tablet 10   APPLE CIDER VINEGAR PO Take 5-10 mLs by mouth See admin instructions. Mix 5-10 ml's into eight ounces of water and drink by mouth once a week     atorvastatin  (LIPITOR ) 40 MG tablet TAKE 1 TABLET BY MOUTH DAILY (Patient taking differently: Take 40 mg by mouth at bedtime.) 30 tablet 10   carvedilol  (COREG ) 25 MG tablet Take 1 tablet (25 mg total) by mouth 2 (two) times daily.     DULoxetine  (CYMBALTA ) 30 MG capsule Take 1 capsule (30 mg total) by mouth daily. (Patient taking differently: Take 30 mg by mouth every evening.) 30 capsule  11   EPINEPHrine  0.3 mg/0.3 mL IJ SOAJ injection Inject 0.3 mg into the muscle as needed for anaphylaxis.     famotidine  (PEPCID ) 20 MG tablet TAKE 1 TABLET BY MOUTH DAILY 90 tablet 11   gabapentin  (NEURONTIN ) 100 MG capsule Take 100mg , three times a week only following dialysis. (Patient taking differently: Take 100 mg by mouth See admin instructions. Take 100 mg by mouth on Mon/Wed/Fri as directed after dialysis) 30 capsule 3   hydrALAZINE  (APRESOLINE ) 50 MG tablet Take 1 tablet (50 mg total) by mouth 2 (two) times daily. 60 tablet 5   levofloxacin  (LEVAQUIN ) 500 MG tablet Take 1 tablet (500 mg total) by mouth every other day. 1 tablet 0   nitroGLYCERIN  (NITROSTAT ) 0.6 MG SL tablet Place 1 tablet (0.6 mg total) under the tongue every 5 (five) minutes as needed for chest pain. 100 tablet 5   olmesartan  (BENICAR ) 20 MG tablet Take 1 tablet (20 mg total) by mouth at bedtime for high blood pressure. 30 tablet 11   polyethylene  glycol (MIRALAX ) 17 g packet Take 17 g by mouth daily. (Patient not taking: Reported on 03/31/2024) 100 each 3   sevelamer  carbonate (RENVELA ) 800 MG tablet Take 1 tablet (800 mg total) by mouth 3 (three) times daily with meals. (Patient not taking: Reported on 03/31/2024) 270 tablet 3   TRELEGY ELLIPTA  100-62.5-25 MCG/ACT AEPB INHALE ONE (1) PUFF BY MOUTH DAILY (Patient taking differently: Inhale 1 puff into the lungs daily.) 60 each 10   No current facility-administered medications on file prior to visit.    Family History  Problem Relation Age of Onset   Diabetes Mother    Hyperlipidemia Mother    Kidney disease Mother    Drug abuse Father    Diabetes Sister    Hyperlipidemia Sister    Hearing loss Sister    Diabetes Brother    Hyperlipidemia Brother     Social History   Socioeconomic History   Marital status: Married    Spouse name: Not on file   Number of children: 4   Years of education: Not on file   Highest education level: 10th grade  Occupational History   Occupation: Hydrologist  Tobacco Use   Smoking status: Former    Current packs/day: 0.00    Average packs/day: 0.5 packs/day for 31.0 years (15.5 ttl pk-yrs)    Types: Cigars, E-cigarettes, Cigarettes    Start date: 09/1990    Quit date: 09/2021    Years since quitting: 2.5   Smokeless tobacco: Never  Vaping Use   Vaping status: Never Used  Substance and Sexual Activity   Alcohol use: Not Currently    Comment: occasioanlly    Drug use: Not Currently    Types: Marijuana    Comment: 2 x week   Sexual activity: Yes    Birth control/protection: None  Other Topics Concern   Not on file  Social History Narrative   Not on file   Social Drivers of Health   Financial Resource Strain: Low Risk  (01/08/2024)   Overall Financial Resource Strain (CARDIA)    Difficulty of Paying Living Expenses: Not very hard  Food Insecurity: No Food Insecurity (03/27/2024)   Hunger Vital Sign    Worried About Running  Out of Food in the Last Year: Never true    Ran Out of Food in the Last Year: Never true  Transportation Needs: No Transportation Needs (03/27/2024)   PRAPARE - Transportation    Lack  of Transportation (Medical): No    Lack of Transportation (Non-Medical): No  Physical Activity: Not on file  Stress: Not on file  Social Connections: Moderately Integrated (10/20/2023)   Social Connection and Isolation Panel    Frequency of Communication with Friends and Family: More than three times a week    Frequency of Social Gatherings with Friends and Family: Three times a week    Attends Religious Services: Never    Active Member of Clubs or Organizations: No    Attends Banker Meetings: 1 to 4 times per year    Marital Status: Married  Catering manager Violence: Not At Risk (03/27/2024)   Humiliation, Afraid, Rape, and Kick questionnaire    Fear of Current or Ex-Partner: No    Emotionally Abused: No    Physically Abused: No    Sexually Abused: No    Review of Systems: Review of Systems  Respiratory:  Negative for shortness of breath.   Cardiovascular:  Negative for chest pain.  Neurological:  Negative for dizziness and headaches.  Psychiatric/Behavioral:  Negative for depression and suicidal ideas. The patient is nervous/anxious and has insomnia.      Vitals:   04/07/24 1039 04/07/24 1041  BP: (!) 162/81 (!) 158/83  Pulse: 78 65  Temp: 97.8 F (36.6 C)   TempSrc: Oral   SpO2: 100%   Weight: 207 lb 12.8 oz (94.3 kg)   Height: 5' 8 (1.727 m)     Physical Exam: Physical Exam Cardiovascular:     Rate and Rhythm: Regular rhythm.     Heart sounds: No murmur heard.    No friction rub. No gallop.  Pulmonary:     Breath sounds: Normal breath sounds. No stridor. No wheezing, rhonchi or rales.  Musculoskeletal:     Right lower leg: No edema.     Left lower leg: No edema.  Skin:    General: Skin is warm and dry.      Assessment & Plan:   Patient seen with Dr.  Shawn Assessment & Plan Essential hypertension Patient stated current medications: amlodipine  10 mg daily, Coreg  25 mg BID, hydralazine  25 milligrams BID, olmesartan  20 mg at bedtime.  Last appointment with cardiology was 02/2024, they also looked at his BP medications.  When patient takes BP at home, he states its around 140-180s/85-118s Patient currently non-adherent to CPAP. Last sleep study 2 years ago. Does not use CPAP every night.  Dialysis MWF. Patient reports no low BP readings during dialysis and no medication differences on dialysis days.   *written in hospital discharge instructions, it seems like he was discharged on hydralazine  50 mg, but patient stated taking 25 mg twice daily.   Plan -4 week follow up to discuss medications again  -increase hydralazine  50 mg twice daily  -continue  amlodipine  10 mg daily, Coreg  25 mg BID, olmesartan  20 mg at bedtime.   -patient counseled on keeping a blood pressure log at home -patient instructed to bring blood pressure cuff to next appointment  -patient instructed to wear CPAP nightly GAD (generalized anxiety disorder) Patient stated that he is anxious daily and it is affected his day/day life and could be contributing to his trouble sleeping. He denies depression, mania, or family history of mania. Patient denies SI/SH/HI. Patient stated while in hospital he was getting duloxetine  30 mg for increased anxiety (he states he has never taken this before). He said it really helped while in hospital, and he has not taken it since leaving the  hospital. He stated that he has only ever taken xanax for anxiety previously. He is interested in restarting the duloxetine  30 mg and counseling.  -referral to behavioral health with Bianca Ashe  -duloxetine  to 30 mg daily re-ordered -return in 4 weeks for f/u Insomnia, unspecified type Patient stated he only gets about 4 hours a sleep a night, it takes him hours to get to sleep nightly. He has tried melatonin  the past but it has not helped. He states his sleep hygiene might not be great as he looks at this phone before bed and the room Is warm when he sleeps where he finds it better to sleep when it is cold downstairs on the couch. Also believes anxiety is contributing to being unable to sleep as he often has some anxious thoughts before bed. Patient is also non-adherent to CPAP as well.  -encouraged patient to improve sleep hygiene by stop looking at phone before bed and try to make room cold and dark  -hopeful with improvement to anxiety symptoms sleep will improve.  Multiple sclerosis (HCC) MS in hospital stable with no new lesions or active demyelination.  -referral to Dr. Vear ordered -interested in aquatic therapy if possible  Syncope, unspecified syncope type Patient was hospitalized for 2 syncopal episodes. Currently wearing ziopatch and following up with cardiology 04/21/2024 Patient reports no new syncopal episodes or dizziness.  -patient aware that if anything changes to let us  know.  Chronic obstructive pulmonary disease, unspecified COPD type (HCC) Hilar lymphadenopathy Patient had CAP in hospital and finished course of levofloxacin  (5 days). No concern for COPD exacerbation while hospitalized. Continued breztri . Incidental hilar lymphadenopathy was found on CT while hospitalized, likely inflammatory, however has history of cigarette usage in past. Recommended repeat scans in 3 months  -LDCT lung ordered   Orders Placed This Encounter  Procedures   CT CHEST LUNG CA SCREEN LOW DOSE W/O CM    Standing Status:   Future    Expiration Date:   04/07/2025    Preferred Imaging Location?:   [facility]   Ambulatory referral to Neurology    Referral Priority:   Routine    Referral Type:   Consultation    Referral Reason:   Specialty Services Required    Referred to Provider:   Vear Charlie LABOR, MD    Requested Specialty:   Neurology    Number of Visits Requested:   1   Ambulatory  referral to Integrated Behavioral Health    Referral Priority:   Routine    Referral Type:   Consultation    Referral Reason:   Specialty Services Required    Referred to Provider:   Bennett Renda PARAS    Number of Visits Requested:   1     Kailynne Ferrington, D.O. [facility] Internal Medicine, PGY-1 Date 04/07/2024 Time 5:42 PM

## 2024-04-07 NOTE — Assessment & Plan Note (Signed)
 MS in hospital stable with no new lesions or active demyelination.  -referral to Dr. Vear ordered -interested in aquatic therapy if possible

## 2024-04-07 NOTE — Assessment & Plan Note (Signed)
 Patient stated he only gets about 4 hours a sleep a night, it takes him hours to get to sleep nightly. He has tried melatonin the past but it has not helped. He states his sleep hygiene might not be great as he looks at this phone before bed and the room Is warm when he sleeps where he finds it better to sleep when it is cold downstairs on the couch. Also believes anxiety is contributing to being unable to sleep as he often has some anxious thoughts before bed. Patient is also non-adherent to CPAP as well.  -encouraged patient to improve sleep hygiene by stop looking at phone before bed and try to make room cold and dark  -hopeful with improvement to anxiety symptoms sleep will improve.

## 2024-04-08 ENCOUNTER — Other Ambulatory Visit (HOSPITAL_COMMUNITY): Payer: Self-pay

## 2024-04-08 DIAGNOSIS — Z23 Encounter for immunization: Secondary | ICD-10-CM | POA: Diagnosis not present

## 2024-04-08 DIAGNOSIS — Z992 Dependence on renal dialysis: Secondary | ICD-10-CM | POA: Diagnosis not present

## 2024-04-08 DIAGNOSIS — D689 Coagulation defect, unspecified: Secondary | ICD-10-CM | POA: Diagnosis not present

## 2024-04-08 DIAGNOSIS — D631 Anemia in chronic kidney disease: Secondary | ICD-10-CM | POA: Diagnosis not present

## 2024-04-08 DIAGNOSIS — N2581 Secondary hyperparathyroidism of renal origin: Secondary | ICD-10-CM | POA: Diagnosis not present

## 2024-04-08 DIAGNOSIS — I10 Essential (primary) hypertension: Secondary | ICD-10-CM | POA: Diagnosis not present

## 2024-04-08 DIAGNOSIS — N186 End stage renal disease: Secondary | ICD-10-CM | POA: Diagnosis not present

## 2024-04-08 MED ORDER — DULOXETINE HCL 30 MG PO CPEP
30.0000 mg | ORAL_CAPSULE | Freq: Every day | ORAL | 11 refills | Status: DC
  Filled 2024-04-08: qty 30, 30d supply, fill #0

## 2024-04-08 NOTE — Addendum Note (Signed)
 Addended by: MARYLU GEE on: 04/08/2024 08:09 AM   Modules accepted: Orders

## 2024-04-09 NOTE — Progress Notes (Signed)
 Internal Medicine Clinic Attending  I was physically present during the key portions of the resident provided service and participated in the medical decision making of patient's management care. I reviewed pertinent patient test results.  The assessment, diagnosis, and plan were formulated together and I agree with the documentation in the resident's note.  Jone Dauphin MD

## 2024-04-09 NOTE — Addendum Note (Signed)
 Addended by: SHAWN SICK on: 04/09/2024 08:53 AM   Modules accepted: Orders, Level of Service

## 2024-04-10 DIAGNOSIS — N186 End stage renal disease: Secondary | ICD-10-CM | POA: Diagnosis not present

## 2024-04-10 DIAGNOSIS — I10 Essential (primary) hypertension: Secondary | ICD-10-CM | POA: Diagnosis not present

## 2024-04-10 DIAGNOSIS — D689 Coagulation defect, unspecified: Secondary | ICD-10-CM | POA: Diagnosis not present

## 2024-04-10 DIAGNOSIS — N2581 Secondary hyperparathyroidism of renal origin: Secondary | ICD-10-CM | POA: Diagnosis not present

## 2024-04-10 DIAGNOSIS — Z992 Dependence on renal dialysis: Secondary | ICD-10-CM | POA: Diagnosis not present

## 2024-04-10 DIAGNOSIS — Z23 Encounter for immunization: Secondary | ICD-10-CM | POA: Diagnosis not present

## 2024-04-10 DIAGNOSIS — D631 Anemia in chronic kidney disease: Secondary | ICD-10-CM | POA: Diagnosis not present

## 2024-04-13 DIAGNOSIS — M21371 Foot drop, right foot: Secondary | ICD-10-CM | POA: Diagnosis not present

## 2024-04-13 DIAGNOSIS — Z23 Encounter for immunization: Secondary | ICD-10-CM | POA: Diagnosis not present

## 2024-04-13 DIAGNOSIS — I10 Essential (primary) hypertension: Secondary | ICD-10-CM | POA: Diagnosis not present

## 2024-04-13 DIAGNOSIS — Z992 Dependence on renal dialysis: Secondary | ICD-10-CM | POA: Diagnosis not present

## 2024-04-13 DIAGNOSIS — N186 End stage renal disease: Secondary | ICD-10-CM | POA: Diagnosis not present

## 2024-04-13 DIAGNOSIS — N2581 Secondary hyperparathyroidism of renal origin: Secondary | ICD-10-CM | POA: Diagnosis not present

## 2024-04-13 DIAGNOSIS — D631 Anemia in chronic kidney disease: Secondary | ICD-10-CM | POA: Diagnosis not present

## 2024-04-13 DIAGNOSIS — D689 Coagulation defect, unspecified: Secondary | ICD-10-CM | POA: Diagnosis not present

## 2024-04-19 DIAGNOSIS — Z992 Dependence on renal dialysis: Secondary | ICD-10-CM | POA: Diagnosis not present

## 2024-04-19 DIAGNOSIS — I120 Hypertensive chronic kidney disease with stage 5 chronic kidney disease or end stage renal disease: Secondary | ICD-10-CM | POA: Diagnosis not present

## 2024-04-19 DIAGNOSIS — N186 End stage renal disease: Secondary | ICD-10-CM | POA: Diagnosis not present

## 2024-04-20 DIAGNOSIS — Z992 Dependence on renal dialysis: Secondary | ICD-10-CM | POA: Diagnosis not present

## 2024-04-20 DIAGNOSIS — N186 End stage renal disease: Secondary | ICD-10-CM | POA: Diagnosis not present

## 2024-04-20 DIAGNOSIS — D631 Anemia in chronic kidney disease: Secondary | ICD-10-CM | POA: Diagnosis not present

## 2024-04-20 DIAGNOSIS — D689 Coagulation defect, unspecified: Secondary | ICD-10-CM | POA: Diagnosis not present

## 2024-04-20 DIAGNOSIS — N2581 Secondary hyperparathyroidism of renal origin: Secondary | ICD-10-CM | POA: Diagnosis not present

## 2024-04-22 NOTE — Addendum Note (Signed)
 Encounter addended by: Janki Dike N on: 04/22/2024 5:17 PM  Actions taken: Imaging Exam ended

## 2024-04-22 NOTE — Addendum Note (Signed)
 Encounter addended by: Habib Kise N on: 04/22/2024 5:18 PM  Actions taken: Imaging Exam ended

## 2024-04-24 DIAGNOSIS — Z992 Dependence on renal dialysis: Secondary | ICD-10-CM | POA: Diagnosis not present

## 2024-04-24 DIAGNOSIS — N186 End stage renal disease: Secondary | ICD-10-CM | POA: Diagnosis not present

## 2024-04-24 DIAGNOSIS — D631 Anemia in chronic kidney disease: Secondary | ICD-10-CM | POA: Diagnosis not present

## 2024-04-24 DIAGNOSIS — D689 Coagulation defect, unspecified: Secondary | ICD-10-CM | POA: Diagnosis not present

## 2024-04-24 DIAGNOSIS — N2581 Secondary hyperparathyroidism of renal origin: Secondary | ICD-10-CM | POA: Diagnosis not present

## 2024-04-26 DIAGNOSIS — R55 Syncope and collapse: Secondary | ICD-10-CM | POA: Diagnosis not present

## 2024-04-27 ENCOUNTER — Ambulatory Visit: Payer: Self-pay | Admitting: Cardiology

## 2024-04-28 DIAGNOSIS — N2581 Secondary hyperparathyroidism of renal origin: Secondary | ICD-10-CM | POA: Diagnosis not present

## 2024-04-28 DIAGNOSIS — Z992 Dependence on renal dialysis: Secondary | ICD-10-CM | POA: Diagnosis not present

## 2024-04-28 DIAGNOSIS — D689 Coagulation defect, unspecified: Secondary | ICD-10-CM | POA: Diagnosis not present

## 2024-04-28 DIAGNOSIS — D631 Anemia in chronic kidney disease: Secondary | ICD-10-CM | POA: Diagnosis not present

## 2024-04-28 DIAGNOSIS — N186 End stage renal disease: Secondary | ICD-10-CM | POA: Diagnosis not present

## 2024-04-30 ENCOUNTER — Encounter: Payer: Self-pay | Admitting: Neurology

## 2024-04-30 ENCOUNTER — Ambulatory Visit (INDEPENDENT_AMBULATORY_CARE_PROVIDER_SITE_OTHER): Admitting: Neurology

## 2024-04-30 VITALS — BP 158/84 | HR 72 | Ht 69.0 in | Wt 205.0 lb

## 2024-04-30 DIAGNOSIS — R202 Paresthesia of skin: Secondary | ICD-10-CM

## 2024-04-30 DIAGNOSIS — R404 Transient alteration of awareness: Secondary | ICD-10-CM

## 2024-04-30 DIAGNOSIS — R269 Unspecified abnormalities of gait and mobility: Secondary | ICD-10-CM

## 2024-04-30 DIAGNOSIS — G35 Multiple sclerosis: Secondary | ICD-10-CM

## 2024-04-30 NOTE — Progress Notes (Signed)
 Chief Complaint  Patient presents with   Hospitalization Follow-up    RM 14 passed out while driving, pt is with daughter       ASSESSMENT AND PLAN  George Day is a 48 y.o. male   Slow worsening gait abnormality, End-stage renal disease, on hemodialysis   Abnormal MRI of brain, cervical and thoracic spine, suggestive of multiple sclerosis,  Positive JC virus titer 1.32 Tunnel vision, passing out episode,  Most consistent with syncope, happened in the setting of extreme hypertension, left ventricular hypertrophy,   Planning to see cardiologist on September 18,  14 days Zio patch as outpatient   He will not continue follow-up at [facility], planning on moving to [facility] May 17, 2024    DIAGNOSTIC DATA (LABS, IMAGING, TESTING) - I reviewed patient records, labs, notes, testing and imaging myself where available.   MEDICAL HISTORY:  George Day 48 year old male, seen in request by primary care Dr. Onesimo, Hans, for evaluation of gait abnormality, abnormal MRIs, initial evaluation June 13, 2023   History is obtained from the patient and review of electronic medical records. I personally reviewed pertinent available imaging films in PACS.   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  MRI of thoracic spine July 2022, T1 signal abnormality, 2 smaller T2/STIR hyperintensity lesion involving T4-T5, also T9 He denies bowel and bladder incontinence,  UPDATE Dec 4th 2024: MRI pending for August 09, 2023, is a Friday, he will have dialysis right after MRI with contrast  He complains sudden onset of right face arm and neck numbness around October, intermittent, only happened when he goes to sleep, did not bother him during the daytime  He will often wake up from sleep even a short nap dense numbness tingly uncomfortable sensation involving right side of his body  UPDATE Sept 11th 2025: Patient lost  follow-up since last visit in December 2024, he moved to [place] , planning on moving again to [facility] on May 17, 2024  He was admitted to the hospital on March 27, 2024 after episode of visual distortion, passing out  He got up very early that morning to drop his wife to her work at 4 AM, on his way driving back he had sudden onset blackout of his vision lasting for few seconds, he was able to pull his car to the right lane and parked, resting for a while he felt okay, was able to drive home, help his 4 children get up to school, at age 43, 62, 93, 3, around 8 AM, when he tried to drive to his  hemodialysis unit, he felt dizzy headed,  tunnel vision, then transient loss of both vision, he was able to put his car into a parking position, when he felt a little bit better, he stepped out of the car, by then he was able to see with blurry vision, he was able to climb up to upstairs, going to call his wife, apparently he had transient loss consciousness of falling to sleep, Next he was awake by his wife's phone  call, when he told his wife what happened, she decided to take a [place] home and took her to the hospital  This episode most suggestive of syncope due to hypoperfusion of the brain, he was seen by cardiologist Dr. Cathlyn Birmingham,  At emergency room, initial blood pressure was elevated 209/114, potassium 3.9, creatinine 7.36, hemoglobin of 11.2, elevated troponin to 43, CT angiogram chest abdomen pelvic showed diffuse bronchial thickening with peribronchial vascular ground glass and consolidation airspace opacity, most prominent along bilateral lower lobes and right apex, may represent infection, COVID-19 infection cannot be excluded, also noted bilateral hilar lymphadenopathy, likely reactive,  Echocardiogram, left ventricular hypertrophy, thought related to uncontrolled hypertension,  He was offered a Zio patch, has pending cardiology evaluation September 18,  He was treated for community-acquired pneumonia, remained on room air, with resolution of cough and shortness of breath, levothyroxine 500 mg every 48 hours for 5 days  Also continued his hemodialysis,  MRI of brain and cervical spine with without contrast, cord signal abnormality involving right hemicord at C4-5, central/right cord C7/T1, consistent with demyelinating disease, stable compared to previous scan, multilevel cervical spondylosis with mild to moderate stenosis C4-5 C6-7, variable degree of foraminal narrowing Scattered T2/FLAIR hyperintensity involving supratentorial cerebral white matter and pons, consistent with history of multiple sclerosis, PHYSICAL EXAM:   Vitals:   04/30/24 1611 04/30/24 1612  BP: (!) 162/80 (!) 158/84  Pulse: 72   SpO2: 99%   Weight: 205 lb (93 kg)   Height: 5' 9 (1.753 m)    Body mass index is 30.27 kg/m.  PHYSICAL EXAMNIATION:  Gen: NAD, conversant, well nourised, well groomed                     Cardiovascular: Regular rate rhythm, no peripheral edema, warm, nontender. Eyes: Conjunctivae  clear without exudates or hemorrhage Neck: Supple, no carotid bruits. Pulmonary: Clear to auscultation bilaterally   NEUROLOGICAL EXAM:  MENTAL STATUS: Speech/cognition: Tired looking middle-age male, awake, alert, oriented to history taking and casual conversation CRANIAL NERVES: CN II: Visual fields are full to confrontation. Pupils are round equal and briskly reactive to light. CN III, IV, VI: extraocular movement are normal. No ptosis. CN V: Facial sensation is intact to light touch CN VII: Face is symmetric with normal eye closure  CN VIII: Hearing is normal to causal conversation. CN IX, X: Phonation is normal. CN XI: Head turning and shoulder shrug are intact  MOTOR: Mild to moderate right hip flexion, ankle dorsiflexion weakness  REFLEXES: Reflexes are 2+ and symmetric at the biceps, 3/3 triceps, knees, and ankles. Plantar responses are extensor bilaterally  SENSORY: Intact to light touch, pinprick and vibratory sensation are intact in fingers and toes.  COORDINATION: There is no trunk or limb dysmetria noted.  GAIT/STANCE: Push-up to get up from seated position, unsteady dragging right leg  REVIEW OF SYSTEMS:  Full 14 system review of systems performed and notable only for as above All other review of systems were negative.   ALLERGIES: Allergies  Allergen Reactions   Bee Venom Anaphylaxis   Penicillins Anaphylaxis   Tomato Anaphylaxis, Swelling and Other (See Comments)    Throat swells- has  tolerated recently, though    HOME MEDICATIONS: Current Outpatient Medications  Medication Sig Dispense Refill   amLODipine  (NORVASC ) 10 MG tablet TAKE 1 TABLET BY MOUTH DAILY 30 tablet 10   APPLE CIDER VINEGAR PO Take 5-10 mLs by mouth See admin instructions. Mix 5-10 ml's into eight ounces of water and drink by mouth once a week     atorvastatin  (LIPITOR ) 40 MG tablet TAKE 1 TABLET BY MOUTH DAILY (Patient taking differently: Take 40 mg by mouth at bedtime.) 30 tablet  10   carvedilol  (COREG ) 25 MG tablet Take 1 tablet (25 mg total) by mouth 2 (two) times daily.     DULoxetine  (CYMBALTA ) 30 MG capsule Take 1 capsule (30 mg total) by mouth daily. 30 capsule 11   EPINEPHrine  0.3 mg/0.3 mL IJ SOAJ injection Inject 0.3 mg into the muscle as needed for anaphylaxis.     famotidine  (PEPCID ) 20 MG tablet TAKE 1 TABLET BY MOUTH DAILY 90 tablet 11   gabapentin  (NEURONTIN ) 100 MG capsule Take 100mg , three times a week only following dialysis. (Patient taking differently: Take 100 mg by mouth See admin instructions. Take 100 mg by mouth on Mon/Wed/Fri as directed after dialysis) 30 capsule 3   hydrALAZINE  (APRESOLINE ) 50 MG tablet Take 1 tablet (50 mg total) by mouth 2 (two) times daily. 60 tablet 5   levofloxacin  (LEVAQUIN ) 500 MG tablet Take 1 tablet (500 mg total) by mouth every other day. 1 tablet 0   nitroGLYCERIN  (NITROSTAT ) 0.6 MG SL tablet Place 1 tablet (0.6 mg total) under the tongue every 5 (five) minutes as needed for chest pain. 100 tablet 5   olmesartan  (BENICAR ) 20 MG tablet Take 1 tablet (20 mg total) by mouth at bedtime for high blood pressure. 30 tablet 11   polyethylene glycol (MIRALAX ) 17 g packet Take 17 g by mouth daily. 100 each 3   sevelamer  carbonate (RENVELA ) 800 MG tablet Take 1 tablet (800 mg total) by mouth 3 (three) times daily with meals. 270 tablet 3   TRELEGY ELLIPTA  100-62.5-25 MCG/ACT AEPB INHALE ONE (1) PUFF BY MOUTH DAILY (Patient taking differently: Inhale 1 puff into the lungs daily.) 60 each 10   No current facility-administered medications for this visit.    PAST MEDICAL HISTORY: Past Medical History:  Diagnosis Date   (HFpEF) heart failure with preserved ejection fraction (HCC)    Acute kidney injury superimposed on chronic kidney disease (HCC)    Acute respiratory distress 02/13/2023   Anemia 1998   Anginal pain (HCC)    Anxiety    Asthma    CKD (chronic kidney disease) stage 5, GFR less than 15 ml/min (HCC)    Coronary  artery disease    Diabetes mellitus without complication (HCC) 1995   Hypertension 2019   MI (myocardial infarction) (HCC) 2010   Multiple sclerosis (HCC)    Sleep apnea    Stroke (HCC) 2010    PAST SURGICAL HISTORY: Past Surgical History:  Procedure Laterality Date   A/V SHUNT INTERVENTION N/A 11/19/2023   Procedure: A/V SHUNT INTERVENTION;  Surgeon: Tobie Gordy POUR, MD;  Location: [facility] INVASIVE CV LAB;  Service: Cardiovascular;  Laterality: N/A;   AV FISTULA PLACEMENT Left 04/24/2023   Procedure: INSERTION OF LEFT ARM ARTERIOVENOUS (AV) GOR-TEX GRAFT;  Surgeon: Magda Debby SAILOR, MD;  Location: [facility] OR;  Service: Vascular;  Laterality: Left;  with regional block   BIOPSY  08/18/2022   Procedure: BIOPSY;  Surgeon: Federico Rosario BROCKS, MD;  Location: [facility] ENDOSCOPY;  Service: Gastroenterology;;   BIOPSY  10/03/2023   Procedure: BIOPSY;  Surgeon: Albertus Gordy HERO, MD;  Location: [facility] ENDOSCOPY;  Service: Gastroenterology;;   CARDIAC CATHETERIZATION  2010   COLONOSCOPY WITH PROPOFOL  N/A 08/18/2022   Procedure: COLONOSCOPY WITH PROPOFOL ;  Surgeon: Federico Rosario BROCKS, MD;  Location: [facility] ENDOSCOPY;  Service: Gastroenterology;  Laterality: N/A;   ESOPHAGOGASTRODUODENOSCOPY (EGD) WITH PROPOFOL  N/A 08/18/2022   Procedure: ESOPHAGOGASTRODUODENOSCOPY (EGD) WITH PROPOFOL ;  Surgeon: Federico Rosario BROCKS, MD;  Location: [facility] ENDOSCOPY;  Service: Gastroenterology;  Laterality: N/A;   ESOPHAGOGASTRODUODENOSCOPY (EGD) WITH PROPOFOL  N/A 10/03/2023   Procedure: ESOPHAGOGASTRODUODENOSCOPY (EGD) WITH PROPOFOL ;  Surgeon: Albertus Gordy HERO, MD;  Location: [facility] ENDOSCOPY;  Service: Gastroenterology;  Laterality: N/A;   GIVENS CAPSULE STUDY N/A 10/22/2023   Procedure: GIVENS CAPSULE STUDY;  Surgeon: Shila Gustav GAILS, MD;  Location: [facility] ENDOSCOPY;  Service: Gastroenterology;  Laterality: N/A;   IR FLUORO GUIDE CV LINE RIGHT  03/05/2023   IR US  GUIDE VASC ACCESS RIGHT  03/05/2023   POLYPECTOMY  08/18/2022   Procedure: POLYPECTOMY;  Surgeon: Federico Rosario BROCKS,  MD;  Location: [facility] ENDOSCOPY;  Service: Gastroenterology;;   VENOUS ANGIOPLASTY Left 11/19/2023   Procedure: VENOUS ANGIOPLASTY;  Surgeon: Tobie Gordy POUR, MD;  Location: [facility] INVASIVE CV LAB;  Service: Cardiovascular;  Laterality: Left;  60% Venous Anastomosis    FAMILY HISTORY: Family History  Problem Relation Age of Onset   Diabetes Mother    Hyperlipidemia Mother    Kidney disease Mother    Drug abuse Father    Diabetes Sister    Hyperlipidemia Sister    Hearing loss Sister    Diabetes Brother    Hyperlipidemia Brother     SOCIAL HISTORY: Social History   Socioeconomic History   Marital status: Married    Spouse name: Not on file   Number of children: 4   Years of education: Not on file   Highest education level: 10th grade  Occupational History   Occupation: Hydrologist  Tobacco Use   Smoking status: Former    Current packs/day: 0.00    Average packs/day: 0.5 packs/day for 31.0 years (15.5 ttl pk-yrs)    Types: Cigars, E-cigarettes, Cigarettes    Start date: 09/1990    Quit date: 09/2021    Years since quitting: 2.6   Smokeless tobacco: Never  Vaping Use   Vaping status: Never Used  Substance and Sexual Activity   Alcohol use: Not Currently    Comment: occasioanlly    Drug use: Not Currently    Types: Marijuana    Comment: 2 x week   Sexual activity: Yes    Birth control/protection: None  Other Topics Concern   Not on file  Social History Narrative   Right handed   Social Drivers of Health   Financial Resource Strain: Low Risk  (01/08/2024)   Overall Financial Resource Strain (CARDIA)    Difficulty of Paying Living Expenses: Not very hard  Food Insecurity: No Food Insecurity (03/27/2024)   Hunger Vital Sign    Worried About Running Out of Food in the Last Year: Never true    Ran Out of Food in the Last Year: Never true  Transportation Needs: No Transportation Needs (03/27/2024)   PRAPARE - Administrator, Civil Service (Medical): No     Lack of Transportation (Non-Medical): No  Physical Activity: Not on file  Stress: Not on file  Social Connections: Moderately Integrated (10/20/2023)   Social Connection and Isolation Panel    Frequency of Communication with Friends and Family: More than three times a week    Frequency of Social Gatherings with Friends and Family: Three times a week    Attends Religious Services: Never    Active Member of Clubs or Organizations: No    Attends Banker Meetings: 1 to 4 times per year    Marital Status: Married  Catering manager Violence: Not At Risk (03/27/2024)   Humiliation, Afraid, Rape, and Kick questionnaire    Fear of Current or Ex-Partner: No    Emotionally Abused: No    Physically Abused: No    Sexually Abused: No      Modena Callander, M.D. Ph.D.  [facility] [address] [place], [place] [address] Ph: [phone] Fax: [phone]  CC:  Francesco Elsie NOVAK, MD [address] [place],  [place] [address]  Benuel Braun, DO

## 2024-05-01 DIAGNOSIS — N2581 Secondary hyperparathyroidism of renal origin: Secondary | ICD-10-CM | POA: Diagnosis not present

## 2024-05-01 DIAGNOSIS — Z992 Dependence on renal dialysis: Secondary | ICD-10-CM | POA: Diagnosis not present

## 2024-05-01 DIAGNOSIS — D689 Coagulation defect, unspecified: Secondary | ICD-10-CM | POA: Diagnosis not present

## 2024-05-01 DIAGNOSIS — N186 End stage renal disease: Secondary | ICD-10-CM | POA: Diagnosis not present

## 2024-05-01 DIAGNOSIS — D631 Anemia in chronic kidney disease: Secondary | ICD-10-CM | POA: Diagnosis not present

## 2024-05-04 DIAGNOSIS — D689 Coagulation defect, unspecified: Secondary | ICD-10-CM | POA: Diagnosis not present

## 2024-05-04 DIAGNOSIS — N186 End stage renal disease: Secondary | ICD-10-CM | POA: Diagnosis not present

## 2024-05-04 DIAGNOSIS — N2581 Secondary hyperparathyroidism of renal origin: Secondary | ICD-10-CM | POA: Diagnosis not present

## 2024-05-04 DIAGNOSIS — Z992 Dependence on renal dialysis: Secondary | ICD-10-CM | POA: Diagnosis not present

## 2024-05-04 DIAGNOSIS — D631 Anemia in chronic kidney disease: Secondary | ICD-10-CM | POA: Diagnosis not present

## 2024-05-06 ENCOUNTER — Encounter (HOSPITAL_BASED_OUTPATIENT_CLINIC_OR_DEPARTMENT_OTHER): Payer: Self-pay

## 2024-05-07 ENCOUNTER — Ambulatory Visit: Attending: Physician Assistant | Admitting: Physician Assistant

## 2024-05-07 ENCOUNTER — Encounter (HOSPITAL_COMMUNITY): Admission: RE | Payer: Self-pay | Source: Home / Self Care

## 2024-05-07 ENCOUNTER — Ambulatory Visit (HOSPITAL_COMMUNITY): Admission: RE | Admit: 2024-05-07 | Source: Home / Self Care | Admitting: Vascular Surgery

## 2024-05-07 ENCOUNTER — Encounter: Payer: Self-pay | Admitting: Physician Assistant

## 2024-05-07 VITALS — BP 170/120 | HR 96 | Ht 69.0 in | Wt 201.0 lb

## 2024-05-07 DIAGNOSIS — E785 Hyperlipidemia, unspecified: Secondary | ICD-10-CM | POA: Diagnosis not present

## 2024-05-07 DIAGNOSIS — Z8673 Personal history of transient ischemic attack (TIA), and cerebral infarction without residual deficits: Secondary | ICD-10-CM | POA: Diagnosis not present

## 2024-05-07 DIAGNOSIS — Z992 Dependence on renal dialysis: Secondary | ICD-10-CM

## 2024-05-07 DIAGNOSIS — N186 End stage renal disease: Secondary | ICD-10-CM

## 2024-05-07 DIAGNOSIS — E119 Type 2 diabetes mellitus without complications: Secondary | ICD-10-CM

## 2024-05-07 DIAGNOSIS — I2510 Atherosclerotic heart disease of native coronary artery without angina pectoris: Secondary | ICD-10-CM | POA: Diagnosis not present

## 2024-05-07 DIAGNOSIS — I10 Essential (primary) hypertension: Secondary | ICD-10-CM

## 2024-05-07 SURGERY — A/V SHUNT INTERVENTION
Anesthesia: LOCAL | Site: Arm Upper | Laterality: Left

## 2024-05-07 NOTE — Patient Instructions (Signed)
 Medication Instructions:  Your physician recommends that you continue on your current medications as directed. Please refer to the Current Medication list given to you today.  *If you need a refill on your cardiac medications before your next appointment, please call your pharmacy*  Lab Work: None If you have labs (blood work) drawn today and your tests are completely normal, you will receive your results only by: MyChart Message (if you have MyChart) OR A paper copy in the mail If you have any lab test that is abnormal or we need to change your treatment, we will call you to review the results.  Testing/Procedures: None  Follow-Up: At [facility], you and your health needs are our priority.  As part of our continuing mission to provide you with exceptional heart care, our providers are all part of one team.  This team includes your primary Cardiologist (physician) and Advanced Practice Providers or APPs (Physician Assistants and Nurse Practitioners) who all work together to provide you with the care you need, when you need it.   We recommend signing up for the patient portal called MyChart.  Sign up information is provided on this After Visit Summary.  MyChart is used to connect with patients for Virtual Visits (Telemedicine).  Patients are able to view lab/test results, encounter notes, upcoming appointments, etc.  Non-urgent messages can be sent to your provider as well.   To learn more about what you can do with MyChart, go to [email or web address].   Other Instructions

## 2024-05-07 NOTE — Progress Notes (Signed)
 Cardiology Office Note   Date:  05/07/2024  ID:  George Day, DOB 10-Nov-1975, MRN [id number] PCP: Benuel Braun, DO  [facility] Providers Cardiologist:  Dorn Lesches, MD     History of Present Illness George Day is a 48 y.o. male with a hx of CAD, HTN, HLD, DM II, ESRD, OSA on CPAP, history of CVA and tobacco abuse. He had a history of MI in 2010, cardiac catheterization was performed however no PCI per patient report.  The procedure was done in [facility] .  Patient presented to the ED on 08/08/2020 with chest pain.  Serial troponin was flat. Echocardiogram performed on 08/09/2020 showed EF 50 to 55%, no regional wall motion abnormality, grade 2 DD, trivial to small pericardial effusion that is circumferential. Renal artery duplex obtained on 08/11/2020 was normal.  I referred the patient to nephrology service in 2021.  Myoview  obtained on 09/14/2020 showed EF 42%, ST segment depression noted in lead II, III, aVF and V6, no evidence of ischemia or infarction on the Myoview  perfusion study, intermediate risk study due to moderately reduced LVEF. Since echocardiogram in late December 2021 showed normal EF, we did not repeat another echocardiogram.  I last saw the patient in February 2022.  He later moved to [place]  however has since came back to [place] .  He was placed on dialysis in 2024. To rule out amyloidosis, patient underwent cardiac MRI in May 2024 that showed normal EF, moderate LVH, borderline elevated extracellular volume percentage, suspected increased myocardial fibrosis in the setting of hypertension.  Echocardiogram obtained in June 2024 showed EF 50 to 55%, no regional wall motion abnormality, moderate pericardial effusion that was circumferential was present, mild MR.  It did not have tamponade physiology.  It was suspected his LVH was due to poorly controlled hypertension.  He was last seen by Dr. Wadie in June 2025, blood pressure was 230 systolic at  the time.  He was diuresed 6 L at the time. More recently, patient was admitted to the hospital on 03/28/2024 for 2 episodes of syncope.  The episodes were preceded by blurry vision and dizziness.  In the ED, troponin trended up to 243 from 33.  Hemoglobin 11.2, creatinine 7.36.  Chest x-ray showed no acute process.  CTA of the chest abdomen abdomen pelvis showed diffuse bronchial wall thickening with peribronchial vascular ground glass opacity.  Bilateral hilar lymphadenopathy likely reactive in etiology.  Nephrology was consulted and patient underwent dialysis.  Echocardiogram obtained during the admission showed EF 60 to 65%, no regional wall motion abnormality, normal RV systolic function, small to moderate circumferential pericardial effusion, trivial MR.  Patient was seen by Dr. Waddell of cardiology service who recommended a 14-day ZIO monitor.  Since discharge, patient has been seen by hypertension clinic, blood pressure was very well-controlled in the 120s at the time.  Heart monitor showed predominantly sinus rhythm but 1 run of SVT however the episode was very brief and only lasted 7 beats.  There were rare PVC and PACs.  Overall normal heart monitor.  Patient presents today for cardiology follow-up and review of the heart monitor.  He denies any exertional chest pain or shortness of breath.  He has no lower extremity edema.  On exam he has a mild heart murmur but recent echocardiogram was reassuring.  I reviewed the recent echocardiogram and his heart monitor with him, he has not had any more dizziness or feeling of passing out since he left the hospital.  At this  time I do not recommend any further workup.  His blood pressure is high in the office, however he says he rushed to get here and did not take any of his blood pressure medication.  I do see that he is scheduled to undergo AV shunt procedure today by Dr. Serene, however he says he was not notified about the procedure and that he has to care for  his daughter.  I did ask him to stop by the second floor on his way out to discuss the situation with the vascular team.  He is moving back to [facility] this Saturday and that already has doctors set up in [place].  ROS:   He denies chest pain, palpitations, dyspnea, pnd, orthopnea, n, v, dizziness, syncope, edema, weight gain, or early satiety. All other systems reviewed and are otherwise negative except as noted above.    Studies Reviewed      Cardiac Studies & Procedures   ______________________________________________________________________________________________   STRESS TESTS  MYOCARDIAL PERFUSION IMAGING 09/14/2020  Interpretation Summary  The left ventricular ejection fraction is moderately decreased (30-44%).  Nuclear stress EF: 42%.  ST segment depression was noted during stress in the II, III, aVF and V6 leads, and returning to baseline after 1-5 minutes of recovery.  No evidence of ischemia or infarct on nuclear perfusion study.  This is an intermediate risk study due to moderately reduced LVEF.  George Sorrow, MD   ECHOCARDIOGRAM  ECHOCARDIOGRAM COMPLETE 03/28/2024  Narrative ECHOCARDIOGRAM REPORT    Patient Name:   George Day Date of Exam: 03/28/2024 Medical Rec #:  [id number]      Height:       68.0 in Accession #:    [id number]     Weight:       204.4 lb Date of Birth:  04/21/1976       BSA:          2.063 m Patient Age:    48 years       BP:           171/93 mmHg Patient Gender: M              HR:           94 bpm. Exam Location:  Inpatient  Procedure: 2D Echo, 3D Echo, Cardiac Doppler, Color Doppler and Strain Analysis (Both Spectral and Color Flow Doppler were utilized during procedure).  STAT ECHO  Indications:    Pericardial Effusion  History:        Patient has prior history of Echocardiogram examinations. CHF, CAD, COPD, Signs/Symptoms:Dyspnea; Risk Factors:Hypertension, Diabetes and Dyslipidemia. CKD Stage 4.  Sonographer:     Philomena Daring Referring Phys: ELNORA IP  IMPRESSIONS   1. Left ventricular ejection fraction, by estimation, is 60 to 65%. The left ventricle has normal function. The left ventricle has no regional wall motion abnormalities. There is severe asymmetric left ventricular hypertrophy of the lateral segment. Left ventricular diastolic parameters are indeterminate. The average left ventricular global longitudinal strain is -10.7 %. 2. Right ventricular systolic function is normal. The right ventricular size is normal. Tricuspid regurgitation signal is inadequate for assessing PA pressure. 3. Small to moderate circumferential pericardial effusion. Moderate effusion along RA. There is no evidence of cardiac tamponade. 4. The mitral valve is normal in structure. Trivial mitral valve regurgitation. No evidence of mitral stenosis. 5. The aortic valve has an indeterminant number of cusps. Aortic valve regurgitation is not visualized. No aortic stenosis is present. Aortic valve mean gradient measures  7.0 mmHg. 6. The inferior vena cava is normal in size with greater than 50% respiratory variability, suggesting right atrial pressure of 3 mmHg.  Comparison(s): Changes from prior study are noted. Pericardial effusion was previously large circumferential.  FINDINGS Left Ventricle: Left ventricular ejection fraction, by estimation, is 60 to 65%. The left ventricle has normal function. The left ventricle has no regional wall motion abnormalities. The average left ventricular global longitudinal strain is -10.7 %. Strain was performed and the global longitudinal strain is indeterminate. The left ventricular internal cavity size was normal in size. There is severe asymmetric left ventricular hypertrophy of the lateral segment. Left ventricular diastolic parameters are indeterminate.  Right Ventricle: The right ventricular size is normal. No increase in right ventricular wall thickness. Right ventricular  systolic function is normal. Tricuspid regurgitation signal is inadequate for assessing PA pressure.  Left Atrium: Left atrial size was normal in size.  Right Atrium: Right atrial size was normal in size.  Pericardium: Small to moderate circumferential pericardial effusion. Moderate effusion along RA. A moderately sized pericardial effusion is present. The pericardial effusion is circumferential. There is no evidence of cardiac tamponade.  Mitral Valve: The mitral valve is normal in structure. Trivial mitral valve regurgitation. No evidence of mitral valve stenosis.  Tricuspid Valve: The tricuspid valve is normal in structure. Tricuspid valve regurgitation is not demonstrated. No evidence of tricuspid stenosis.  Aortic Valve: The aortic valve has an indeterminant number of cusps. Aortic valve regurgitation is not visualized. No aortic stenosis is present. Aortic valve mean gradient measures 7.0 mmHg. Aortic valve peak gradient measures 13.1 mmHg. Aortic valve area, by VTI measures 2.17 cm.  Pulmonic Valve: The pulmonic valve was normal in structure. Pulmonic valve regurgitation is trivial. No evidence of pulmonic stenosis.  Aorta: The aortic root and ascending aorta are structurally normal, with no evidence of dilitation.  Venous: The inferior vena cava is normal in size with greater than 50% respiratory variability, suggesting right atrial pressure of 3 mmHg.  IAS/Shunts: No atrial level shunt detected by color flow Doppler.  Additional Comments: 3D was performed not requiring image post processing on an independent workstation and was indeterminate.   LEFT VENTRICLE PLAX 2D LVIDd:         4.60 cm      Diastology LVIDs:         3.20 cm      LV e' medial:    6.74 cm/s LV PW:         1.40 cm      LV E/e' medial:  17.2 LV IVS:        1.40 cm      LV e' lateral:   8.92 cm/s LVOT diam:     2.00 cm      LV E/e' lateral: 13.0 LV SV:         68 LV SV Index:   33           2D Longitudinal  Strain LVOT Area:     3.14 cm     2D Strain GLS Avg:     -10.7 %  LV Volumes (MOD) LV vol d, MOD A2C: 91.0 ml LV vol d, MOD A4C: 153.0 ml LV vol s, MOD A2C: 23.5 ml LV vol s, MOD A4C: 38.6 ml LV SV MOD A2C:     67.5 ml LV SV MOD A4C:     153.0 ml LV SV MOD BP:      86.9 ml  RIGHT VENTRICLE  IVC RV S prime:     16.00 cm/s  IVC diam: 1.60 cm TAPSE (M-mode): 3.2 cm  LEFT ATRIUM             Index        RIGHT ATRIUM           Index LA diam:        4.00 cm 1.94 cm/m   RA Area:     17.60 cm LA Vol (A2C):   55.1 ml 26.71 ml/m  RA Volume:   43.60 ml  21.14 ml/m LA Vol (A4C):   58.5 ml 28.36 ml/m LA Biplane Vol: 61.2 ml 29.67 ml/m AORTIC VALVE AV Area (Vmax):    2.27 cm AV Area (Vmean):   2.18 cm AV Area (VTI):     2.17 cm AV Vmax:           181.00 cm/s AV Vmean:          123.000 cm/s AV VTI:            0.313 m AV Peak Grad:      13.1 mmHg AV Mean Grad:      7.0 mmHg LVOT Vmax:         131.00 cm/s LVOT Vmean:        85.500 cm/s LVOT VTI:          0.216 m LVOT/AV VTI ratio: 0.69  AORTA Ao Root diam: 2.50 cm Ao Asc diam:  3.30 cm  MITRAL VALVE MV Area (PHT): 4.21 cm     SHUNTS MV Decel Time: 180 msec     Systemic VTI:  0.22 m MV E velocity: 116.00 cm/s  Systemic Diam: 2.00 cm MV A velocity: 84.00 cm/s MV E/A ratio:  1.38  George Day Electronically signed by Diannah Late Day Signature Date/Time: 03/28/2024/8:40:30 AM    Final    MONITORS  LONG TERM MONITOR-LIVE TELEMETRY (3-14 DAYS) 04/22/2024  Narrative Patch Wear Time:  13 days and 9 hours (2025-08-11T08:36:08-398 to 2025-08-24T17:46:17-0400)  Patient had a min HR of 61 bpm, max HR of 156 bpm, and avg HR of 87 bpm. Predominant underlying rhythm was Sinus Rhythm. 1 run of Supraventricular Tachycardia occurred lasting 7 beats with a max rate of 156 bpm (avg 146 bpm). Isolated SVEs were rare (<1.0%), SVE Couplets were rare (<1.0%), and SVE Triplets were rare (<1.0%). Isolated  VEs were rare (<1.0%), and no VE Couplets or VE Triplets were present.  SR/ST Short runs of SVT Occasional PACs/PVCs     CARDIAC MRI  MR CARDIAC MORPHOLOGY W WO CONTRAST 01/04/2023  Narrative CLINICAL DATA:  Cardiomyopathy with LVH  EXAM: CARDIAC MRI  TECHNIQUE: The patient was scanned on a 1.5 Tesla GE magnet. A dedicated cardiac coil was used. Functional imaging was done using Fiesta sequences. 2,3, and 4 chamber views were done to assess for RWMA's. Modified Simpson's rule using a short axis stack was used to calculate an ejection fraction on a dedicated work [employer]. The patient received 10 cc of Gadavist . After 10 minutes inversion recovery sequences were used to assess for infiltration and scar tissue.  FINDINGS: Moderate pericardial effusion, primarily inferior to heart. No evidence for tamponade (no respirophasic variation of the interventricular septum on free-breathing sequences).  Normal left ventricular size with moderate concentric LV hypertrophy, global hypokinesis with EF 49%. Normal right ventricular size with EF 51%. Moderately dilated left atrium, normal right atrium. Trileaflet aortic valve, no stenosis or regurgitation. Visually, mitral regurgitation looks mild.  On delayed enhancement imaging, there was possible subtle mid-wall late gadolinium enhancement (LGE) noted in the basal to mid septum.  MEASUREMENTS: MEASUREMENTS LVEDV 209 mL  LVEDVi 99 ml/m2  LVSV 102 mL  LVEF 49%  RVEDV 174 mL  RVEDVi 83 mL/m2  RVSV 89 mL RVEF 51%  Aortic flows do not look accurate.  T1 1082, ECV 30%  IMPRESSION: 1. Normal LV size with moderate LV hypertrophy. EF 49%, diffuse mild hypokinesis.  2.  Normal RV size and systolic function, EF 51%.  3. Subtle mid-wall LGE primarily in the basal to mid septum. This is nonspecific.  4. Borderline elevated extracellular volume percentage, suspect increased myocardial fibrosis in  setting of hypertension.  5. Moderate pericardial effusion, primarily inferior to heart. No evidence for tamponade.  George Day   Electronically Signed By: Ezra Shuck M.D. On: 01/06/2023 10:51   ______________________________________________________________________________________________      Risk Assessment/Calculations          Physical Exam VS:  BP (!) 170/120   Pulse 96   Ht 5' 9 (1.753 m)   Wt 201 lb (91.2 kg)   SpO2 99%   BMI 29.68 kg/m        Wt Readings from Last 3 Encounters:  05/07/24 201 lb (91.2 kg)  04/30/24 205 lb (93 kg)  04/07/24 207 lb 12.8 oz (94.3 kg)    GEN: Well nourished, well developed in no acute distress NECK: No JVD; No carotid bruits CARDIAC: RRR, no murmurs, rubs, gallops RESPIRATORY:  Clear to auscultation without rales, wheezing or rhonchi  ABDOMEN: Soft, non-tender, non-distended EXTREMITIES:  No edema; No deformity   ASSESSMENT AND PLAN  Coronary artery disease: Patient has presumed coronary artery disease, he never had cardiac catheterization in [place].  He reportedly had an MI and a heart cath in 2010 in [place].  No PCI was performed.  I do not have the record to see what degree of blockage he had.  Hypertension: He has longstanding history of uncontrolled high blood pressure.  Blood pressure is at 170 today, however patient says he rushed to get to the office that did not take his blood pressure medication.  When he does take his blood pressure medication his blood pressure has been in the 120s.  Hyperlipidemia: On atorvastatin   DM2: Followed by primary care provider.  ESRD: On dialysis.  Supposed to undergo AV fistula shunt procedure today, however patient was not aware of the procedure.  He is adamant that he cannot undergo procedure today as he is obligated to watch his daughter.  I asked him to stop by the vascular surgery floor to discuss with them.  He does report that he is scheduled to move back to  [facility] this Saturday and has all the physicians appointment set up.  History of CVA: No recent recurrence.        Dispo: Establish with cardiology service after moving back to [facility].  Signed, Scot Ford, PA

## 2024-05-08 ENCOUNTER — Encounter: Payer: Self-pay | Admitting: Internal Medicine

## 2024-05-08 DIAGNOSIS — N2581 Secondary hyperparathyroidism of renal origin: Secondary | ICD-10-CM | POA: Diagnosis not present

## 2024-05-08 DIAGNOSIS — Z992 Dependence on renal dialysis: Secondary | ICD-10-CM | POA: Diagnosis not present

## 2024-05-08 DIAGNOSIS — D631 Anemia in chronic kidney disease: Secondary | ICD-10-CM | POA: Diagnosis not present

## 2024-05-08 DIAGNOSIS — N186 End stage renal disease: Secondary | ICD-10-CM | POA: Diagnosis not present

## 2024-05-08 DIAGNOSIS — D689 Coagulation defect, unspecified: Secondary | ICD-10-CM | POA: Diagnosis not present

## 2024-05-11 DIAGNOSIS — N186 End stage renal disease: Secondary | ICD-10-CM | POA: Diagnosis not present

## 2024-05-11 DIAGNOSIS — D689 Coagulation defect, unspecified: Secondary | ICD-10-CM | POA: Diagnosis not present

## 2024-05-11 DIAGNOSIS — N2581 Secondary hyperparathyroidism of renal origin: Secondary | ICD-10-CM | POA: Diagnosis not present

## 2024-05-11 DIAGNOSIS — Z992 Dependence on renal dialysis: Secondary | ICD-10-CM | POA: Diagnosis not present

## 2024-05-12 ENCOUNTER — Encounter: Admitting: Student

## 2024-05-12 ENCOUNTER — Institutional Professional Consult (permissible substitution): Payer: Self-pay | Admitting: Licensed Clinical Social Worker

## 2024-05-12 NOTE — Progress Notes (Deleted)
 Essential hypertension Patient stated current medications: amlodipine  10 mg daily, Coreg  25 mg BID, hydralazine  25 milligrams BID, olmesartan  20 mg at bedtime.  Last appointment with cardiology was 02/2024, they also looked at his BP medications.  When patient takes BP at home, he states its around 140-180s/85-118s Patient currently non-adherent to CPAP. Last sleep study 2 years ago. Does not use CPAP every night.  Dialysis MWF. Patient reports no low BP readings during dialysis and no medication differences on dialysis days.    *written in hospital discharge instructions, it seems like he was discharged on hydralazine  50 mg, but patient stated taking 25 mg twice daily.    Plan -4 week follow up to discuss medications again  -increase hydralazine  50 mg twice daily  -continue  amlodipine  10 mg daily, Coreg  25 mg BID, olmesartan  20 mg at bedtime.   -patient counseled on keeping a blood pressure log at home*** -patient instructed to bring blood pressure cuff to next appointment *** -patient instructed to wear CPAP nightly GAD (generalized anxiety disorder) Patient stated that he is anxious daily and it is affected his day/day life and could be contributing to his trouble sleeping. He denies depression, mania, or family history of mania. Patient denies SI/SH/HI. Patient stated while in hospital he was getting duloxetine  30 mg for increased anxiety (he states he has never taken this before). He said it really helped while in hospital, and he has not taken it since leaving the hospital. He stated that he has only ever taken xanax for anxiety previously. He is interested in restarting the duloxetine  30 mg and counseling.  -referral to behavioral health with Renda Pontes ***today at 10:30 AM -duloxetine  to 30 mg daily re-ordered -return in 4 weeks for f/u Insomnia, unspecified type Patient stated he only gets about 4 hours a sleep a night, it takes him hours to get to sleep nightly. He has tried  melatonin the past but it has not helped. He states his sleep hygiene might not be great as he looks at this phone before bed and the room Is warm when he sleeps where he finds it better to sleep when it is cold downstairs on the couch. Also believes anxiety is contributing to being unable to sleep as he often has some anxious thoughts before bed. Patient is also non-adherent to CPAP as well.  -encouraged patient to improve sleep hygiene by stop looking at phone before bed and try to make room cold and dark  -hopeful with improvement to anxiety symptoms sleep will improve. *** Multiple sclerosis (HCC) MS in hospital stable with no new lesions or active demyelination.  -referral to Dr. Vear ordered -interested in aquatic therapy if possible  Syncope, unspecified syncope type Patient was hospitalized for 2 syncopal episodes. Currently wearing ziopatch and following up with cardiology 04/21/2024 Patient reports no new syncopal episodes or dizziness.  -patient aware that if anything changes to let us  know. ***1 run of SVT, 7 beats, rare PVCs and PACs Chronic obstructive pulmonary disease, unspecified COPD type (HCC) Hilar lymphadenopathy Patient had CAP in hospital and finished course of levofloxacin  (5 days). No concern for COPD exacerbation while hospitalized. Continued breztri . Incidental hilar lymphadenopathy was found on CT while hospitalized, likely inflammatory, however has history of cigarette usage in past. Recommended repeat scans in 3 months  -LDCT lung ordered***not done yet  Hepatitis C screening  T2DM-diet controlled, A1c today    CC: Routine Follow Up for hypertension and anxiety after last office visit 04/07/2024  HPI:  George Day is a 48 y.o. male with pertinent PMH of hypertension, HFpEF, COPD, MS, T2DM, and ESRD on HD MWF who presents as above. Please see assessment and plan below for further details.  Medications: Current Outpatient Medications  Medication Instructions    amLODipine  (NORVASC ) 10 mg, Oral, Daily   APPLE CIDER VINEGAR PO 5-10 mLs, See admin instructions   atorvastatin  (LIPITOR ) 40 mg, Oral, Daily   carvedilol  (COREG ) 25 mg, Oral, 2 times daily   DULoxetine  (CYMBALTA ) 30 mg, Oral, Daily   EPINEPHrine  (EPI-PEN) 0.3 mg, As needed   famotidine  (PEPCID ) 20 mg, Oral, Daily   gabapentin  (NEURONTIN ) 100 MG capsule Take 100mg , three times a week only following dialysis.   hydrALAZINE  (APRESOLINE ) 50 mg, Oral, 2 times daily   levofloxacin  (LEVAQUIN ) 500 mg, Oral, Every 48 hours   nitroGLYCERIN  (NITROSTAT ) 0.6 mg, Sublingual, Every 5 min PRN   olmesartan  (BENICAR ) 20 MG tablet Take 1 tablet (20 mg total) by mouth at bedtime for high blood pressure.   polyethylene glycol (MIRALAX ) 17 g, Oral, Daily   sevelamer  carbonate (RENVELA ) 800 mg, Oral, 3 times daily with meals   TRELEGY ELLIPTA  100-62.5-25 MCG/ACT AEPB INHALE ONE (1) PUFF BY MOUTH DAILY     Review of Systems:   Pertinent items noted in HPI and/or A&P.  Physical Exam:  There were no vitals filed for this visit.  Constitutional:***. In no acute distress. HEENT: Normocephalic, atraumatic, Sclera non-icteric, PERRL, EOM intact Cardio:Regular rate and rhythm. 2+ bilateral {PulseLoc:28294} pulses. Pulm:Clear to auscultation bilaterally. Normal work of breathing on room air. Abdomen: Soft, non-tender, non-distended, positive bowel sounds. FDX:Wzhjupcz for extremity edema. Skin:Warm and dry. Neuro:Alert and oriented x3. No focal deficit noted. Psych:Pleasant mood and affect.   Assessment & Plan:   No problem-specific Assessment & Plan notes found for this encounter.    Patient {GC/GE:[id number]::discussed with,seen with} {JGIMTSattending2025/2026:32954}  Fairy Pool, DO [facility] Internal Medicine Resident PGY-2 Clinic Phone: [phone] Please contact the on call pager at [phone] for any urgent or emergent needs.

## 2024-05-13 DIAGNOSIS — D689 Coagulation defect, unspecified: Secondary | ICD-10-CM | POA: Diagnosis not present

## 2024-05-13 DIAGNOSIS — N2581 Secondary hyperparathyroidism of renal origin: Secondary | ICD-10-CM | POA: Diagnosis not present

## 2024-05-13 DIAGNOSIS — N186 End stage renal disease: Secondary | ICD-10-CM | POA: Diagnosis not present

## 2024-05-13 DIAGNOSIS — Z992 Dependence on renal dialysis: Secondary | ICD-10-CM | POA: Diagnosis not present

## 2024-05-14 DIAGNOSIS — M21371 Foot drop, right foot: Secondary | ICD-10-CM | POA: Diagnosis not present

## 2024-05-15 ENCOUNTER — Telehealth: Payer: Self-pay

## 2024-05-15 DIAGNOSIS — D689 Coagulation defect, unspecified: Secondary | ICD-10-CM | POA: Diagnosis not present

## 2024-05-15 DIAGNOSIS — Z992 Dependence on renal dialysis: Secondary | ICD-10-CM | POA: Diagnosis not present

## 2024-05-15 DIAGNOSIS — N186 End stage renal disease: Secondary | ICD-10-CM | POA: Diagnosis not present

## 2024-05-15 DIAGNOSIS — N2581 Secondary hyperparathyroidism of renal origin: Secondary | ICD-10-CM | POA: Diagnosis not present

## 2024-05-15 NOTE — Telephone Encounter (Signed)
 Called pt to  ask a few questions for COPD  and to reschedule his  missed appt ... Please forward call to me .SABRA Thanks in advance

## 2024-05-18 DIAGNOSIS — D689 Coagulation defect, unspecified: Secondary | ICD-10-CM | POA: Diagnosis not present

## 2024-05-18 DIAGNOSIS — Z992 Dependence on renal dialysis: Secondary | ICD-10-CM | POA: Diagnosis not present

## 2024-05-18 DIAGNOSIS — N2581 Secondary hyperparathyroidism of renal origin: Secondary | ICD-10-CM | POA: Diagnosis not present

## 2024-05-18 DIAGNOSIS — N186 End stage renal disease: Secondary | ICD-10-CM | POA: Diagnosis not present

## 2024-05-19 ENCOUNTER — Other Ambulatory Visit: Payer: Self-pay

## 2024-05-19 DIAGNOSIS — N186 End stage renal disease: Secondary | ICD-10-CM | POA: Diagnosis not present

## 2024-05-19 DIAGNOSIS — Z87891 Personal history of nicotine dependence: Secondary | ICD-10-CM

## 2024-05-19 DIAGNOSIS — I120 Hypertensive chronic kidney disease with stage 5 chronic kidney disease or end stage renal disease: Secondary | ICD-10-CM | POA: Diagnosis not present

## 2024-05-19 DIAGNOSIS — G6289 Other specified polyneuropathies: Secondary | ICD-10-CM

## 2024-05-19 DIAGNOSIS — I5032 Chronic diastolic (congestive) heart failure: Secondary | ICD-10-CM

## 2024-05-19 DIAGNOSIS — I25118 Atherosclerotic heart disease of native coronary artery with other forms of angina pectoris: Secondary | ICD-10-CM

## 2024-05-19 DIAGNOSIS — Z992 Dependence on renal dialysis: Secondary | ICD-10-CM | POA: Diagnosis not present

## 2024-05-19 DIAGNOSIS — I129 Hypertensive chronic kidney disease with stage 1 through stage 4 chronic kidney disease, or unspecified chronic kidney disease: Secondary | ICD-10-CM

## 2024-05-19 NOTE — Telephone Encounter (Unsigned)
 Copied from CRM [id number]. Topic: Clinical - Medication Refill >> May 19, 2024 10:47 AM Zane F wrote: Caller: Pat  Calling From: [facility]  Looking to refill a few of the patient's prescriptions. All prescriptions are due for refill for next shipment. Please confirm with patient before sending prescriptions, the pharmacy calling is not listed as his preferred pharmacy.   Medication:   1. amLODipine  (NORVASC ) 10 MG tablet  2. atorvastatin  (LIPITOR ) 40 MG tablet  3. gabapentin  (NEURONTIN ) 100 MG capsule  4. carvedilol  (COREG ) 25 MG tablet  Has the patient contacted their pharmacy? Yes  This is the patient's preferred pharmacy:   [facility], [place] - [address] [place] [id number] Phone: [phone]  Fax: [phone]    Is this the correct pharmacy for this prescription? Yes If no, delete pharmacy and type the correct one.   Has the prescription been filled recently? No  Is the patient out of the medication? No  Has the patient been seen for an appointment in the last year OR does the patient have an upcoming appointment? Yes  Can we respond through MyChart? Yes  Agent: Please be advised that Rx refills may take up to 3 business days. We ask that you follow-up with your pharmacy.

## 2024-05-21 MED ORDER — GABAPENTIN 100 MG PO CAPS: ORAL_CAPSULE | ORAL | 3 refills | Status: AC

## 2024-05-21 MED ORDER — ATORVASTATIN CALCIUM 40 MG PO TABS
40.0000 mg | ORAL_TABLET | Freq: Every day | ORAL | 10 refills | Status: AC
Start: 2024-05-21 — End: ?

## 2024-05-21 MED ORDER — AMLODIPINE BESYLATE 10 MG PO TABS: 10.0000 mg | ORAL_TABLET | Freq: Every day | ORAL | 1 refills | Status: DC

## 2024-05-21 MED ORDER — CARVEDILOL 25 MG PO TABS: 25.0000 mg | ORAL_TABLET | Freq: Two times a day (BID) | ORAL | Status: DC

## 2024-05-29 ENCOUNTER — Encounter: Payer: Self-pay | Admitting: Internal Medicine

## 2024-06-02 ENCOUNTER — Other Ambulatory Visit: Payer: Self-pay

## 2024-06-02 DIAGNOSIS — I5032 Chronic diastolic (congestive) heart failure: Secondary | ICD-10-CM

## 2024-06-02 NOTE — Telephone Encounter (Signed)
 Last rx was sent 05/21/2024, rx was sent as no print. Please resend rx.

## 2024-06-04 MED ORDER — CARVEDILOL 25 MG PO TABS: 25.0000 mg | ORAL_TABLET | Freq: Two times a day (BID) | ORAL | 3 refills | Status: AC

## 2024-06-29 ENCOUNTER — Ambulatory Visit (HOSPITAL_COMMUNITY)

## 2024-07-07 ENCOUNTER — Other Ambulatory Visit: Payer: Self-pay | Admitting: Neurology

## 2024-09-03 ENCOUNTER — Other Ambulatory Visit: Payer: Self-pay

## 2024-09-03 DIAGNOSIS — I129 Hypertensive chronic kidney disease with stage 1 through stage 4 chronic kidney disease, or unspecified chronic kidney disease: Secondary | ICD-10-CM

## 2024-09-04 NOTE — Telephone Encounter (Signed)
 Medication sent to pharmacy

## 2024-09-25 ENCOUNTER — Encounter (HOSPITAL_COMMUNITY): Payer: Self-pay | Admitting: Surgery

## 7994-01-18 DEATH — deceased
